# Patient Record
Sex: Male | Born: 1960 | Race: White | Hispanic: No | Marital: Married | Smoking: Never smoker
Health system: Southern US, Community
[De-identification: ages and names within clinical notes are randomized; demographics above are authoritative.]

## PROBLEM LIST (undated history)

## (undated) DIAGNOSIS — J189 Pneumonia, unspecified organism: Secondary | ICD-10-CM

## (undated) DIAGNOSIS — F419 Anxiety disorder, unspecified: Secondary | ICD-10-CM

## (undated) DIAGNOSIS — F32A Depression, unspecified: Secondary | ICD-10-CM

## (undated) DIAGNOSIS — I10 Essential (primary) hypertension: Secondary | ICD-10-CM

## (undated) HISTORY — PX: ANKLE FRACTURE SURGERY: SHX122

---

## 1999-05-29 ENCOUNTER — Ambulatory Visit (HOSPITAL_COMMUNITY): Admission: RE | Admit: 1999-05-29 | Discharge: 1999-05-29 | Payer: Self-pay | Admitting: Gastroenterology

## 2006-12-30 ENCOUNTER — Ambulatory Visit (HOSPITAL_COMMUNITY): Admission: AD | Admit: 2006-12-30 | Discharge: 2006-12-30 | Payer: Self-pay | Admitting: Orthopedic Surgery

## 2015-09-10 HISTORY — PX: COLON SURGERY: SHX602

## 2016-09-23 ENCOUNTER — Other Ambulatory Visit: Payer: Self-pay | Admitting: Family Medicine

## 2016-09-23 DIAGNOSIS — L723 Sebaceous cyst: Secondary | ICD-10-CM | POA: Diagnosis not present

## 2016-09-23 DIAGNOSIS — B079 Viral wart, unspecified: Secondary | ICD-10-CM | POA: Diagnosis not present

## 2016-12-31 DIAGNOSIS — I10 Essential (primary) hypertension: Secondary | ICD-10-CM | POA: Diagnosis not present

## 2017-04-22 DIAGNOSIS — H401131 Primary open-angle glaucoma, bilateral, mild stage: Secondary | ICD-10-CM | POA: Diagnosis not present

## 2017-04-23 ENCOUNTER — Encounter (HOSPITAL_BASED_OUTPATIENT_CLINIC_OR_DEPARTMENT_OTHER): Payer: Self-pay | Admitting: Emergency Medicine

## 2017-04-23 ENCOUNTER — Emergency Department (HOSPITAL_BASED_OUTPATIENT_CLINIC_OR_DEPARTMENT_OTHER): Payer: 59

## 2017-04-23 ENCOUNTER — Inpatient Hospital Stay: Admit: 2017-04-23 | Payer: Self-pay | Admitting: General Surgery

## 2017-04-23 ENCOUNTER — Inpatient Hospital Stay (HOSPITAL_COMMUNITY): Payer: 59

## 2017-04-23 ENCOUNTER — Inpatient Hospital Stay (HOSPITAL_BASED_OUTPATIENT_CLINIC_OR_DEPARTMENT_OTHER)
Admission: EM | Admit: 2017-04-23 | Discharge: 2017-04-28 | DRG: 331 | Disposition: A | Discharge status: Home / Self Care | Payer: 59 | Attending: General Surgery | Admitting: General Surgery

## 2017-04-23 DIAGNOSIS — K56699 Other intestinal obstruction unspecified as to partial versus complete obstruction: Secondary | ICD-10-CM | POA: Diagnosis not present

## 2017-04-23 DIAGNOSIS — K56609 Unspecified intestinal obstruction, unspecified as to partial versus complete obstruction: Principal | ICD-10-CM | POA: Diagnosis present

## 2017-04-23 DIAGNOSIS — I10 Essential (primary) hypertension: Secondary | ICD-10-CM | POA: Diagnosis present

## 2017-04-23 DIAGNOSIS — K50012 Crohn's disease of small intestine with intestinal obstruction: Secondary | ICD-10-CM | POA: Diagnosis not present

## 2017-04-23 DIAGNOSIS — R1033 Periumbilical pain: Secondary | ICD-10-CM | POA: Diagnosis not present

## 2017-04-23 DIAGNOSIS — Q430 Meckel's diverticulum (displaced) (hypertrophic): Secondary | ICD-10-CM | POA: Diagnosis not present

## 2017-04-23 DIAGNOSIS — K56600 Partial intestinal obstruction, unspecified as to cause: Secondary | ICD-10-CM | POA: Diagnosis not present

## 2017-04-23 DIAGNOSIS — Z4682 Encounter for fitting and adjustment of non-vascular catheter: Secondary | ICD-10-CM | POA: Diagnosis not present

## 2017-04-23 DIAGNOSIS — R109 Unspecified abdominal pain: Secondary | ICD-10-CM | POA: Diagnosis not present

## 2017-04-23 HISTORY — DX: Essential (primary) hypertension: I10

## 2017-04-23 LAB — URINALYSIS, ROUTINE W REFLEX MICROSCOPIC
Bilirubin Urine: NEGATIVE
Glucose, UA: NEGATIVE mg/dL
Hgb urine dipstick: NEGATIVE
Ketones, ur: 15 mg/dL — AB
LEUKOCYTES UA: NEGATIVE
NITRITE: NEGATIVE
PROTEIN: NEGATIVE mg/dL
SPECIFIC GRAVITY, URINE: 1.019 (ref 1.005–1.030)
pH: 8 (ref 5.0–8.0)

## 2017-04-23 LAB — COMPREHENSIVE METABOLIC PANEL
ALBUMIN: 4.8 g/dL (ref 3.5–5.0)
ALT: 38 U/L (ref 17–63)
ANION GAP: 10 (ref 5–15)
AST: 35 U/L (ref 15–41)
Alkaline Phosphatase: 52 U/L (ref 38–126)
BUN: 13 mg/dL (ref 6–20)
CHLORIDE: 103 mmol/L (ref 101–111)
CO2: 22 mmol/L (ref 22–32)
Calcium: 9.3 mg/dL (ref 8.9–10.3)
Creatinine, Ser: 0.82 mg/dL (ref 0.61–1.24)
GFR calc Af Amer: >60 mL/min (ref >60)
GLUCOSE: 121 mg/dL — AB (ref 65–99)
POTASSIUM: 4 mmol/L (ref 3.5–5.1)
Sodium: 135 mmol/L (ref 135–145)
TOTAL PROTEIN: 7.1 g/dL (ref 6.5–8.1)
Total Bilirubin: 1.2 mg/dL (ref 0.3–1.2)

## 2017-04-23 LAB — LIPASE, BLOOD: LIPASE: 24 U/L (ref 11–51)

## 2017-04-23 LAB — CBC
HEMATOCRIT: 43.8 % (ref 39.0–52.0)
HEMOGLOBIN: 15.8 g/dL (ref 13.0–17.0)
MCH: 34.3 pg — ABNORMAL HIGH (ref 26.0–34.0)
MCHC: 36.1 g/dL — ABNORMAL HIGH (ref 30.0–36.0)
MCV: 95.2 fL (ref 78.0–100.0)
Platelets: 229 10*3/uL (ref 150–400)
RBC: 4.6 MIL/uL (ref 4.22–5.81)
RDW: 11.6 % (ref 11.5–15.5)
WBC: 8.8 10*3/uL (ref 4.0–10.5)

## 2017-04-23 IMAGING — DX DG ABD PORTABLE 1V
1 series · 1 of 1 positions shown · non-contrast
Comparison: None.

CLINICAL DATA: Nasogastric tube placement.

EXAM:
PORTABLE ABDOMEN - 1 VIEW

[abdomen kub]
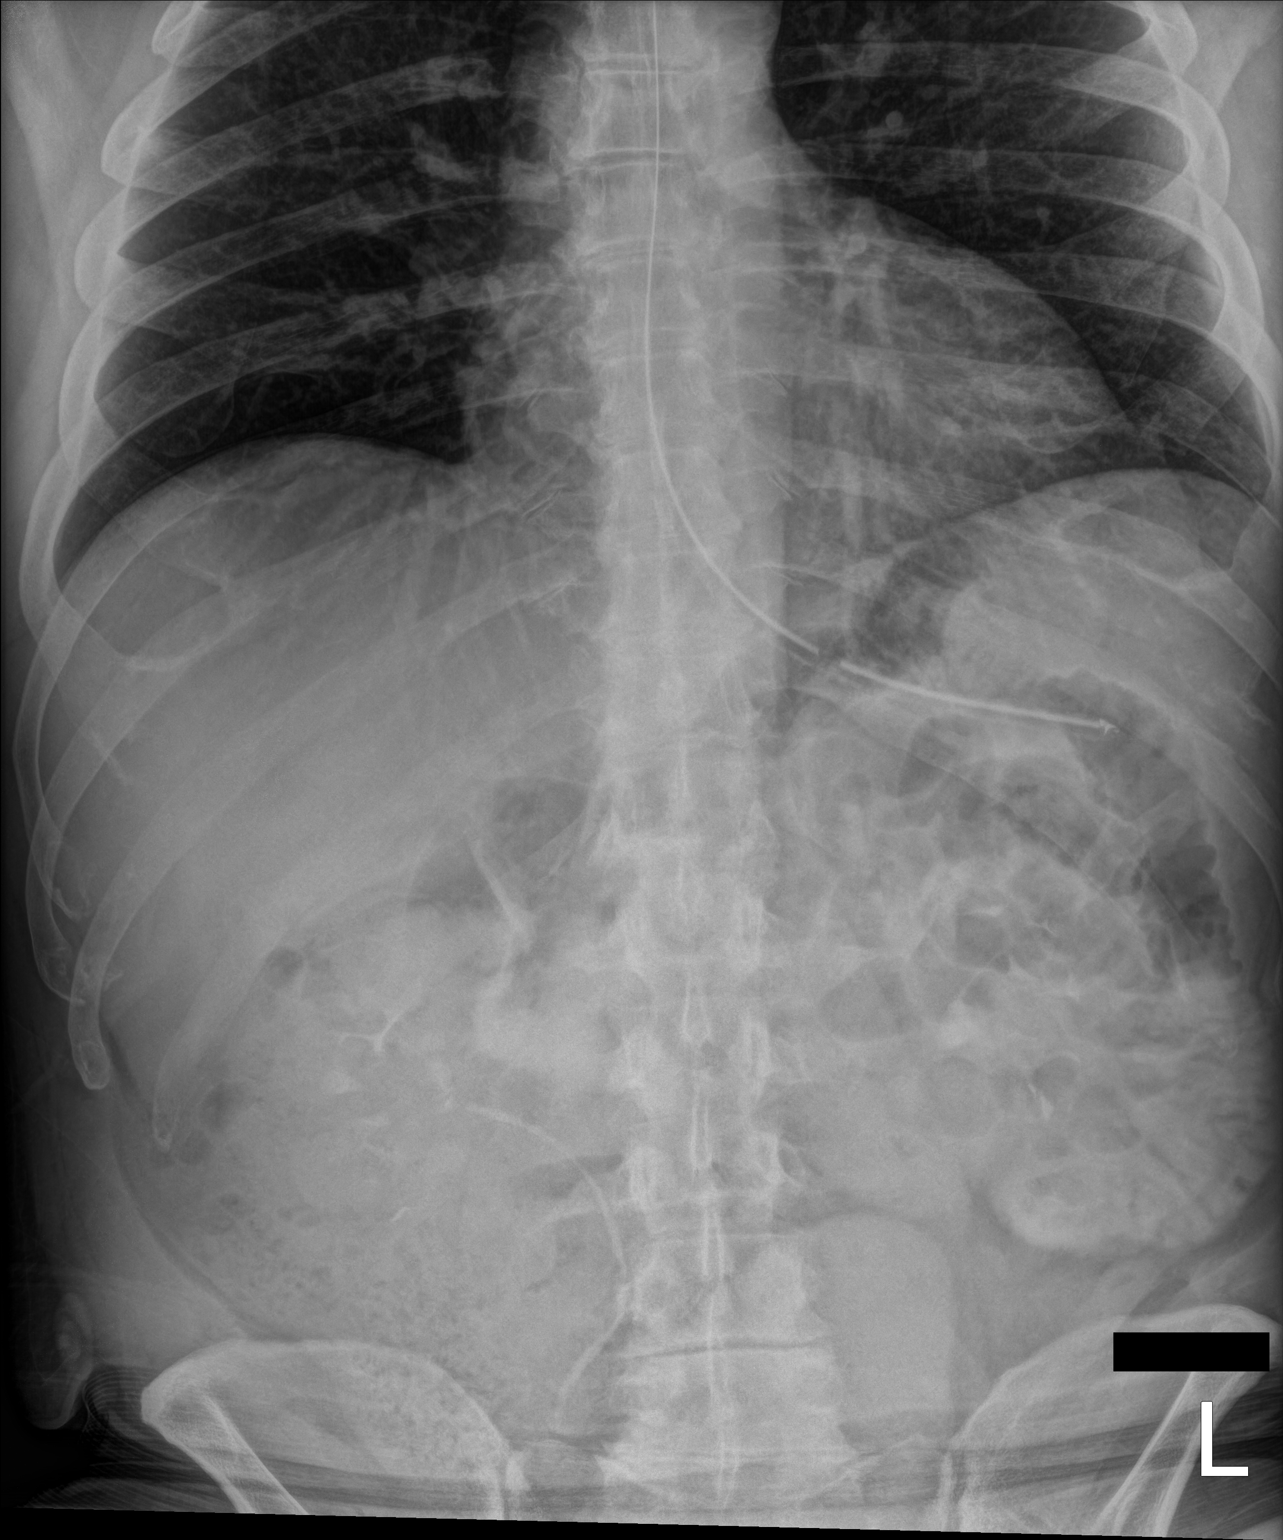

[1 of 1 positions shown; findings below may reference images not displayed]

FINDINGS: The bowel gas pattern is normal. Distal tip of nasogastric tube is
seen in expected position of proximal stomach. No radio-opaque
calculi or other significant radiographic abnormality are seen.
IMPRESSION: No evidence of bowel obstruction or ileus. Distal tip of nasogastric
tube is seen in expected position of proximal stomach.

## 2017-04-23 IMAGING — CT CT ABD-PELV W/ CM
2 of 5 series · 16 of 46 positions shown, 18 images · IV contrast (APPLIED)
Comparison: None.

CLINICAL DATA: Awoke early this morning with abdominal pain.

EXAM:
CT ABDOMEN AND PELVIS WITH CONTRAST
TECHNIQUE: Multidetector CT imaging of the abdomen and pelvis was performed
using the standard protocol following bolus administration of
intravenous contrast.
CONTRAST:  100mL [05] IOPAMIDOL ([05]) INJECTION 61%

[Series 2: axial st · axial · 0.95mm/px · z∈[-528,-54]mm · 13 of 107 slices shown, 15 images]
[im 6/107  soft-tissue]
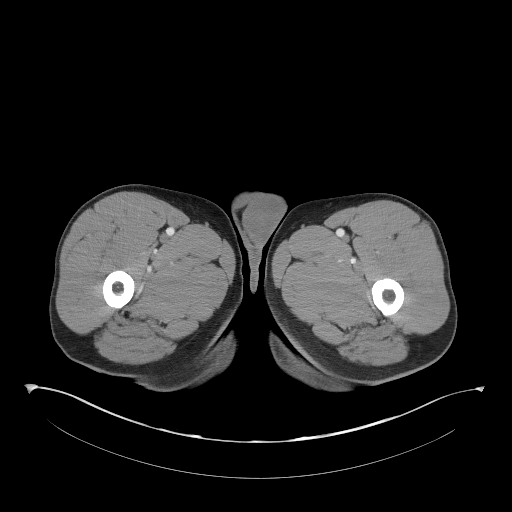
[im 6/107  bone]
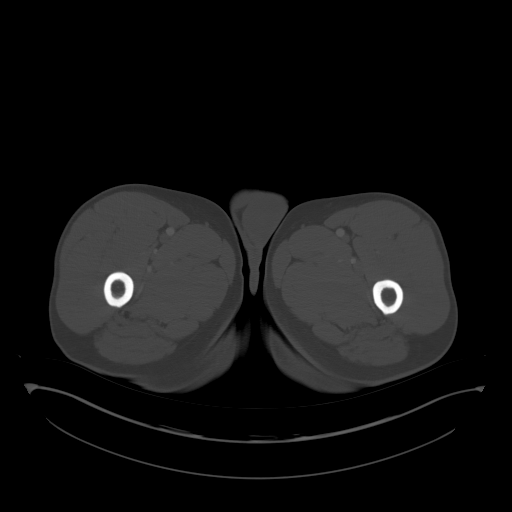
[im 17/107  soft-tissue]
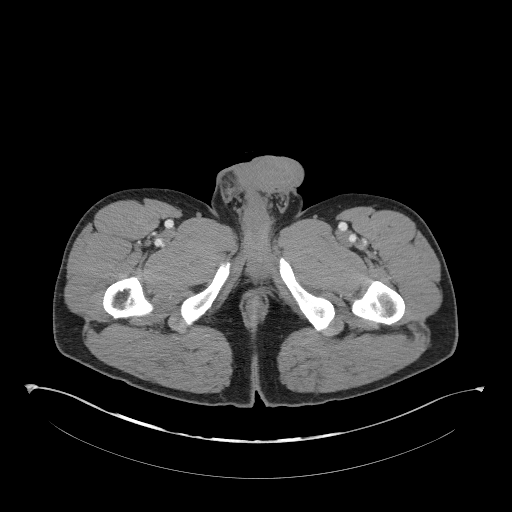
[im 23/107  soft-tissue]
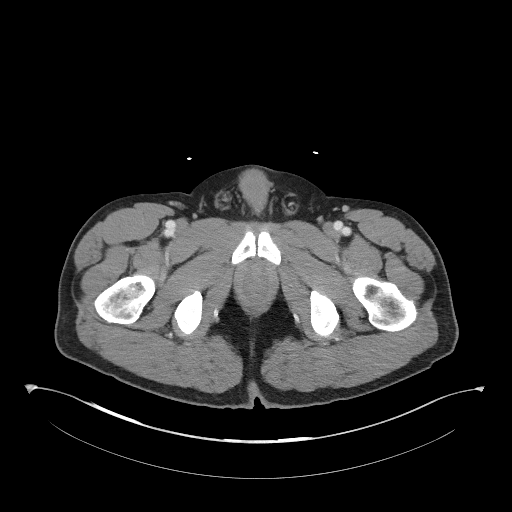
[im 28/107  soft-tissue]
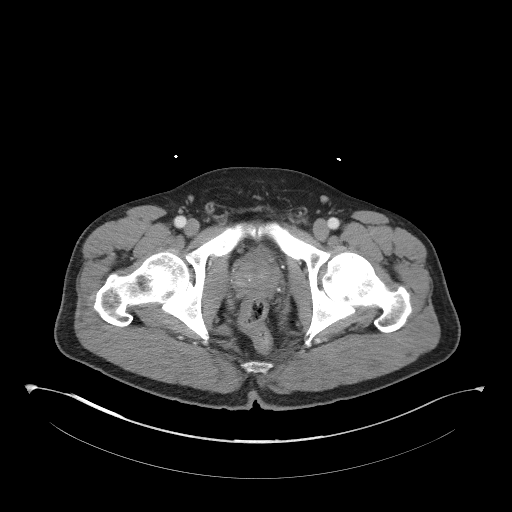
[im 40/107  soft-tissue]
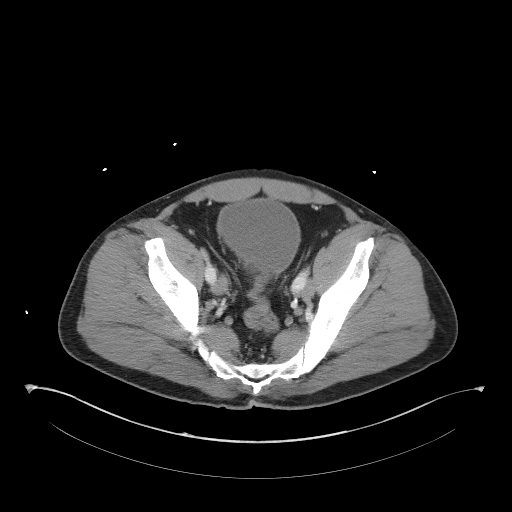
[im 45/107  soft-tissue]
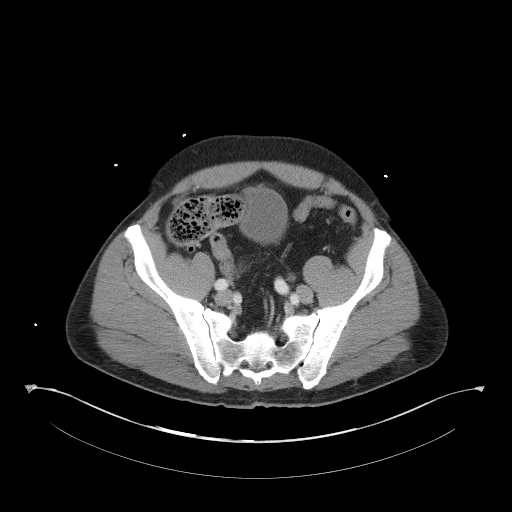
[im 56/107  soft-tissue]
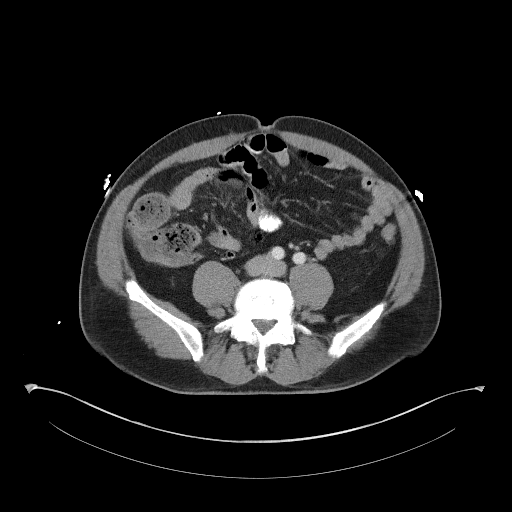
[im 62/107  soft-tissue]
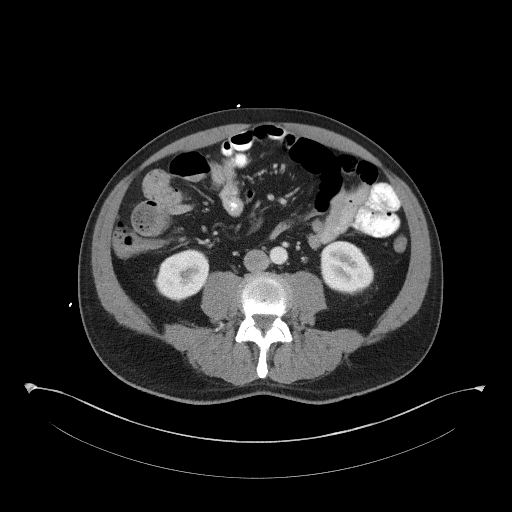
[im 67/107  soft-tissue]
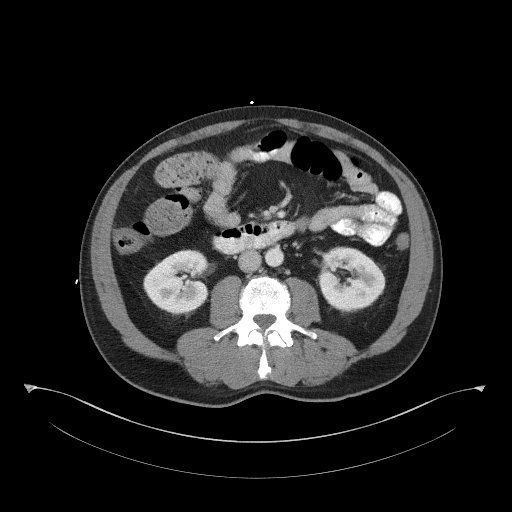
[im 67/107  bone]
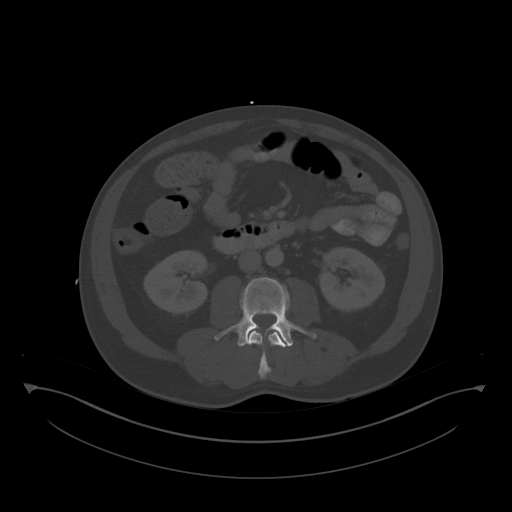
[im 79/107  soft-tissue]
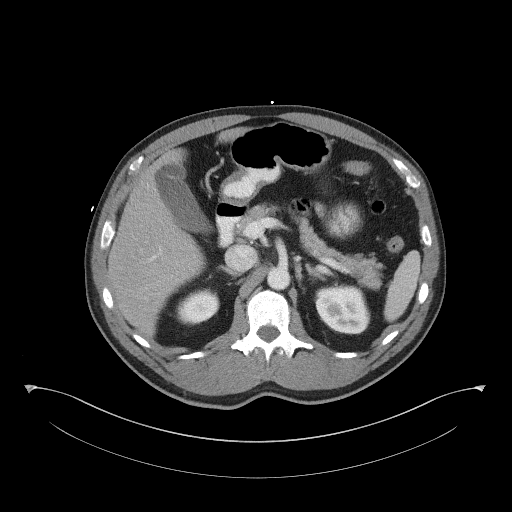
[im 84/107  soft-tissue]
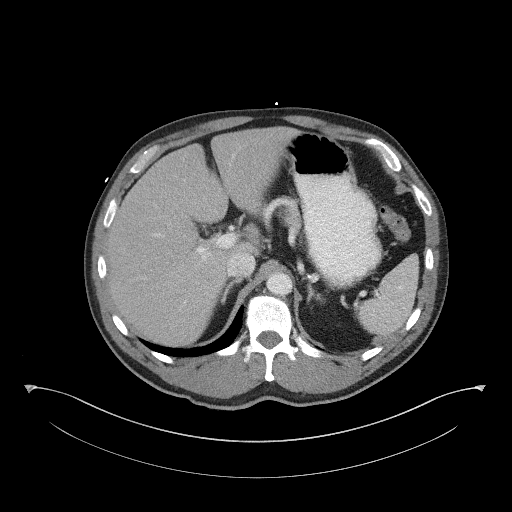
[im 90/107  soft-tissue]
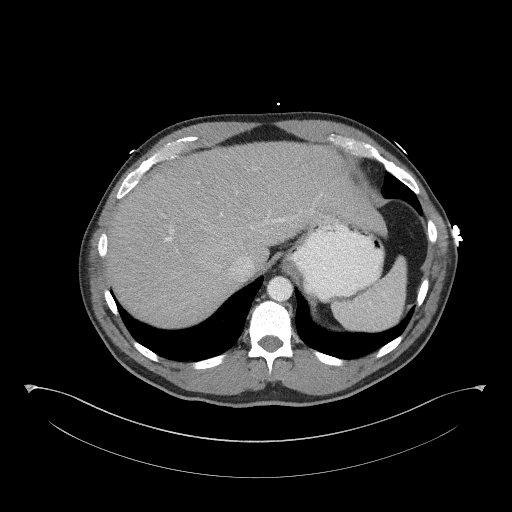
[im 101/107  soft-tissue]
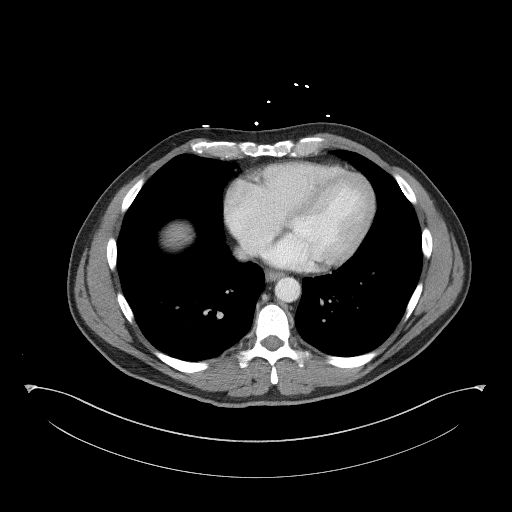

[Series 5: coronal st · coronal · 0.83mm/px · 3 of 102 slices shown]
[im 34/102  soft-tissue]
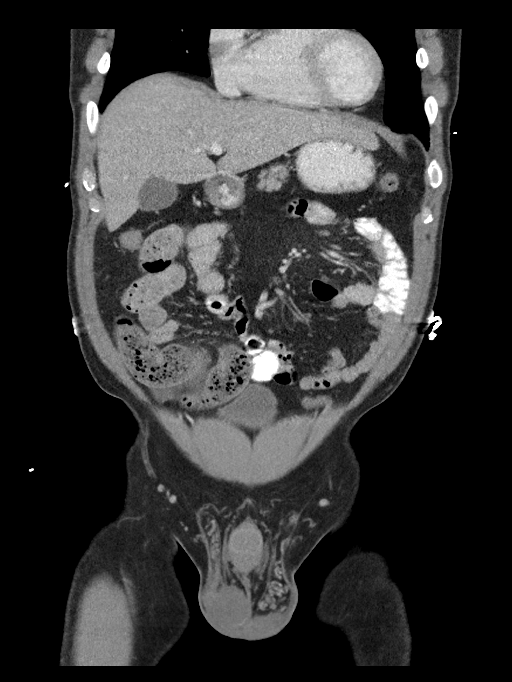
[im 45/102  soft-tissue]
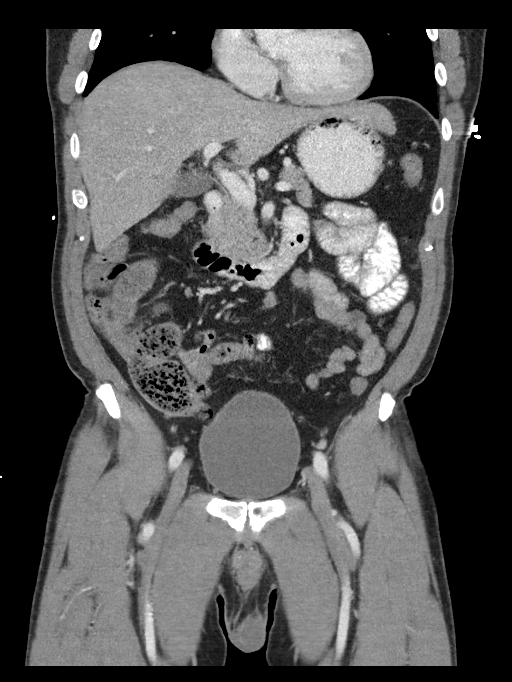
[im 57/102  soft-tissue]
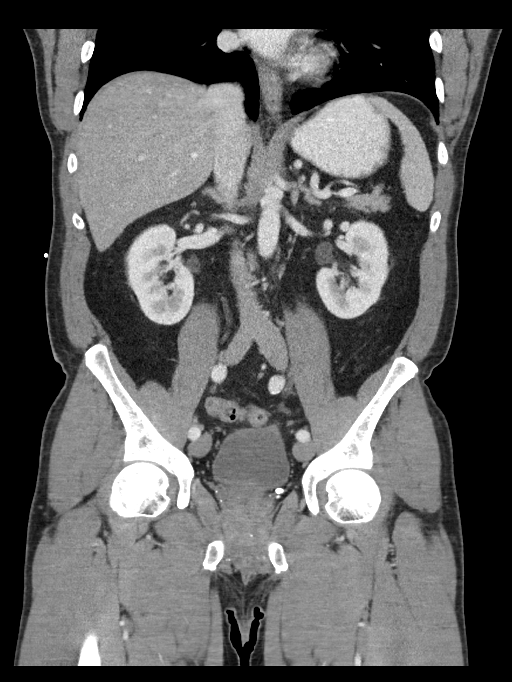

[16 of 46 positions shown; findings below may reference images not displayed]

FINDINGS: Lower chest:  No contributory findings.

Hepatobiliary: No focal liver abnormality.No evidence of biliary
obstruction or stone.

Pancreas: Unremarkable.

Spleen: Unremarkable.

Adrenals/Urinary Tract: Negative adrenals. No hydronephrosis or
stone. Unremarkable bladder.

Stomach/Bowel: Ileum is dilated with fecalized contents. Abrupt
transition of the terminal ileum best seen on coronal reformats,
with no evidence of underlying ileitis or mass. There is associated
mesenteric edema and trace peritoneal fluid. The appendix is seen
and negative. Few colonic diverticula at the level of the sigmoid.

Vascular/Lymphatic: No acute vascular abnormality. No mass or
adenopathy.

Reproductive:Mild symmetric prostate enlargement.

Other: No ascites or pneumoperitoneum.

Musculoskeletal: Generalized disc narrowing with endplate
degeneration greatest at L4-5 and L5-S1.
IMPRESSION: Findings of early small bowel obstruction with dilated ileum,
fecalized contents and mild mesenteric edema. A transition point is
seen at the terminal ileum.

## 2017-04-23 MED ORDER — HYDRALAZINE HCL 20 MG/ML IJ SOLN
10.0000 mg | INTRAMUSCULAR | Status: DC | PRN
  Filled 2017-04-23: qty 0.5

## 2017-04-23 MED ORDER — SODIUM CHLORIDE 0.9 % IV SOLN
INTRAVENOUS | Status: DC
  Administered 2017-04-23 – 2017-04-27 (×8): via INTRAVENOUS

## 2017-04-23 MED ORDER — LIDOCAINE HCL 2 % EX GEL
CUTANEOUS | Status: AC
  Administered 2017-04-23: 1
  Filled 2017-04-23: qty 20

## 2017-04-23 MED ORDER — LORAZEPAM 2 MG/ML IJ SOLN
1.0000 mg | Freq: Once | INTRAMUSCULAR | Status: AC
  Administered 2017-04-23: 1 mg via INTRAVENOUS
  Filled 2017-04-23: qty 1

## 2017-04-23 MED ORDER — ONDANSETRON HCL 4 MG/2ML IJ SOLN: 4.0000 mg | Freq: Three times a day (TID) | INTRAMUSCULAR | Status: AC | PRN

## 2017-04-23 MED ORDER — ENOXAPARIN SODIUM 40 MG/0.4ML ~~LOC~~ SOLN
40.0000 mg | SUBCUTANEOUS | Status: DC
  Administered 2017-04-23 – 2017-04-24 (×2): 40 mg via SUBCUTANEOUS
  Filled 2017-04-23 (×2): qty 0.4

## 2017-04-23 MED ORDER — HYDROMORPHONE HCL 1 MG/ML IJ SOLN: 1.0000 mg | INTRAMUSCULAR | Status: DC | PRN

## 2017-04-23 MED ORDER — SODIUM CHLORIDE 0.9 % IV SOLN
INTRAVENOUS | Status: AC
  Administered 2017-04-23: 12:00:00 via INTRAVENOUS

## 2017-04-23 MED ORDER — DIPHENHYDRAMINE HCL 50 MG/ML IJ SOLN: 25.0000 mg | Freq: Four times a day (QID) | INTRAMUSCULAR | Status: DC | PRN

## 2017-04-23 MED ORDER — MORPHINE SULFATE (PF) 2 MG/ML IV SOLN
2.0000 mg | INTRAVENOUS | Status: DC | PRN
  Administered 2017-04-23 – 2017-04-25 (×12): 4 mg via INTRAVENOUS
  Administered 2017-04-25: 2 mg via INTRAVENOUS
  Filled 2017-04-23 (×2): qty 2
  Filled 2017-04-23: qty 1
  Filled 2017-04-23 (×5): qty 2
  Filled 2017-04-23: qty 1
  Filled 2017-04-23 (×4): qty 2
  Filled 2017-04-23: qty 1

## 2017-04-23 MED ORDER — MORPHINE SULFATE (PF) 4 MG/ML IV SOLN
4.0000 mg | INTRAVENOUS | Status: DC | PRN
  Administered 2017-04-23: 4 mg via INTRAVENOUS
  Filled 2017-04-23: qty 1

## 2017-04-23 MED ORDER — MENTHOL 3 MG MT LOZG
1.0000 | LOZENGE | OROMUCOSAL | Status: DC | PRN
  Filled 2017-04-23 (×2): qty 9

## 2017-04-23 MED ORDER — HYDROMORPHONE HCL 1 MG/ML IJ SOLN
1.0000 mg | Freq: Once | INTRAMUSCULAR | Status: AC
  Administered 2017-04-23: 1 mg via INTRAVENOUS
  Filled 2017-04-23: qty 1

## 2017-04-23 MED ORDER — DIATRIZOATE MEGLUMINE & SODIUM 66-10 % PO SOLN
90.0000 mL | Freq: Once | ORAL | Status: AC
  Administered 2017-04-23: 90 mL via NASOGASTRIC
  Filled 2017-04-23: qty 90

## 2017-04-23 MED ORDER — IOPAMIDOL (ISOVUE-300) INJECTION 61%
100.0000 mL | Freq: Once | INTRAVENOUS | Status: AC | PRN
  Administered 2017-04-23: 100 mL via INTRAVENOUS

## 2017-04-23 MED ORDER — HYDROMORPHONE HCL-NACL 0.5-0.9 MG/ML-% IV SOSY: 1.0000 mg | PREFILLED_SYRINGE | Freq: Once | INTRAVENOUS | Status: DC

## 2017-04-23 NOTE — ED Notes (Signed)
ED Provider at bedside. 

## 2017-04-23 NOTE — H&P (Signed)
[facility] Consult/Admission Note  Christopher Carter [birth year]  [id number].    Requesting MD: Dr. Venora Maples Chief Complaint/Reason for Consult: SBO  HPI:   Pt is a 56 year old male with a history of HTN who presented to [facility] ED with complaints of abdominal pain. Pt states pain started abruptly at 0100 today and waxes and wanes in severity. Pain is located in the periumbilical region, non-radiating, crampy at times and sharp at times. He had a normal Bm this AM. Associated one episode of non-bilious vomiting. No other associated symptoms. Pt was feeling fine with normal appetite, eating, having BM's exercising up until this morning. No fever, chills, CP, SOB, dizziness, blood in stool or vomit. No history of abdominal surgeries or intestinal issues. Pt believes he had a colonoscopy years ago for blood in his stools and no acute issues were found. Pt does not take blood thinners. Last meal was last night.   ED Course: Labs: glucose 121, all others unremarkable CT scan abd/pel: Findings of early small bowel obstruction with dilated ileum, fecalized contents and mild mesenteric edema. A transition point is seen at the terminal ileum  ROS:  Review of Systems  Constitutional: Negative for chills, diaphoresis and fever.  Respiratory: Negative for shortness of breath.   Cardiovascular: Negative for chest pain.  Gastrointestinal: Positive for abdominal pain, nausea and vomiting. Negative for blood in stool, constipation, diarrhea and melena.  Skin: Negative for rash.  Neurological: Negative for dizziness and loss of consciousness.  All other systems reviewed and are negative.    No family history on file.  Past Medical History:  Diagnosis Date  . Hypertension     History reviewed. No pertinent surgical history.  Social History:  reports that he has never smoked. He has never used smokeless tobacco. He reports that he drinks about 3.0 oz of alcohol per week  . He reports that he does not use drugs.  Allergies: No Known Allergies  Medications Prior to Admission  Medication Sig Dispense Refill  . amLODipine (NORVASC) 10 MG tablet Take 10 mg by mouth daily.    . Glucos-Chond-Hyal Ac-Ca Fructo (MOVE FREE JOINT HEALTH ADVANCE PO) Take 1 tablet by mouth daily.    Christopher Carter HAWTHORNE BERRY PO Take 1 tablet by mouth daily.    . irbesartan (AVAPRO) 300 MG tablet Take 300 mg by mouth daily.    Christopher Carter KRILL OIL PO Take 1 capsule by mouth daily.    Christopher Carter OVER THE COUNTER MEDICATION Take 1 tablet by mouth daily.    . Red Yeast Rice Extract (RED YEAST RICE PO) Take 2 tablets by mouth daily.      Blood pressure (!) 156/90, pulse 93, temperature 98.4 F (36.9 C), temperature source Oral, resp. rate (!) 22, height _0  (1.778 m), weight 200 lb (90.7 kg), SpO2 98 %.  Physical Exam  Constitutional: He is oriented to person, place, and time and well-developed, well-nourished, and in no distress. No distress.  HENT:  Head: Normocephalic and atraumatic.  Nose: Nose normal.  Mouth/Throat: Oropharynx is clear and moist. No oropharyngeal exudate.  Eyes: Pupils are equal, round, and reactive to light. Conjunctivae are normal. Right eye exhibits no discharge. Left eye exhibits no discharge. No scleral icterus.  Neck: Normal range of motion. Neck supple. No thyromegaly present.  Cardiovascular: Normal rate, regular rhythm, normal heart sounds and intact distal pulses.  Exam reveals no gallop and no friction rub.   No murmur heard. Pulses:  Radial pulses are 2+ on the right side, and 2+ on the left side.  Pulmonary/Chest: Effort normal and breath sounds normal. No respiratory distress. He has no wheezes. He has no rhonchi. He has no rales.  Abdominal: Soft. Normal appearance and bowel sounds are normal. He exhibits no distension. There is no hepatosplenomegaly. There is tenderness in the right lower quadrant. No hernia.  Musculoskeletal: Normal range of motion. He exhibits no  edema, tenderness or deformity.  Lymphadenopathy:    He has no cervical adenopathy.  Neurological: He is alert and oriented to person, place, and time. No cranial nerve deficit (grossly intact).  Skin: Skin is warm and dry. No rash noted. He is not diaphoretic.  Psychiatric: Mood normal. He has a flat affect.  Nursing note and vitals reviewed.   Results for orders placed or performed during the hospital encounter of 04/23/17 (from the past 48 hour(s))  Lipase, blood     Status: None   Collection Time: 04/23/17  9:21 AM  Result Value Ref Range   Lipase 24 11 - 51 U/L  Comprehensive metabolic panel     Status: Abnormal   Collection Time: 04/23/17  9:21 AM  Result Value Ref Range   Sodium 135 135 - 145 mmol/L   Potassium 4.0 3.5 - 5.1 mmol/L   Chloride 103 101 - 111 mmol/L   CO2 22 22 - 32 mmol/L   Glucose, Bld 121 (H) 65 - 99 mg/dL   BUN 13 6 - 20 mg/dL   Creatinine, Ser 0.82 0.61 - 1.24 mg/dL   Calcium 9.3 8.9 - 10.3 mg/dL   Total Protein 7.1 6.5 - 8.1 g/dL   Albumin 4.8 3.5 - 5.0 g/dL   AST 35 15 - 41 U/L   ALT 38 17 - 63 U/L   Alkaline Phosphatase 52 38 - 126 U/L   Total Bilirubin 1.2 0.3 - 1.2 mg/dL   GFR calc non Af Amer >60 >60 mL/min   GFR calc Af Amer >60 >60 mL/min    Comment: (NOTE) The eGFR has been calculated using the CKD EPI equation. This calculation has not been validated in all clinical situations. eGFR's persistently <60 mL/min signify possible Chronic Kidney Disease.    Anion gap 10 5 - 15  CBC     Status: Abnormal   Collection Time: 04/23/17  9:21 AM  Result Value Ref Range   WBC 8.8 4.0 - 10.5 K/uL   RBC 4.60 4.22 - 5.81 MIL/uL   Hemoglobin 15.8 13.0 - 17.0 g/dL   HCT 43.8 39.0 - 52.0 %   MCV 95.2 78.0 - 100.0 fL   MCH 34.3 (H) 26.0 - 34.0 pg   MCHC 36.1 (H) 30.0 - 36.0 g/dL   RDW 11.6 11.5 - 15.5 %   Platelets 229 150 - 400 K/uL  Urinalysis, Routine w reflex microscopic     Status: Abnormal   Collection Time: 04/23/17 10:45 AM  Result Value  Ref Range   Color, Urine YELLOW YELLOW   APPearance CLEAR CLEAR   Specific Gravity, Urine 1.019 1.005 - 1.030   pH 8.0 5.0 - 8.0   Glucose, UA NEGATIVE NEGATIVE mg/dL   Hgb urine dipstick NEGATIVE NEGATIVE   Bilirubin Urine NEGATIVE NEGATIVE   Ketones, ur 15 (A) NEGATIVE mg/dL   Protein, ur NEGATIVE NEGATIVE mg/dL   Nitrite NEGATIVE NEGATIVE   Leukocytes, UA NEGATIVE NEGATIVE    Comment: Microscopic not done on urines with negative protein, blood, leukocytes, nitrite, or glucose < 500  mg/dL.   Ct Abdomen Pelvis W Contrast  Result Date: 04/23/2017 CLINICAL DATA:  Awoke early this morning with abdominal pain. EXAM: CT ABDOMEN AND PELVIS WITH CONTRAST TECHNIQUE: Multidetector CT imaging of the abdomen and pelvis was performed using the standard protocol following bolus administration of intravenous contrast. CONTRAST:  1100m ISOVUE-300 IOPAMIDOL (ISOVUE-300) INJECTION 61% COMPARISON:  None. FINDINGS: Lower chest:  No contributory findings. Hepatobiliary: No focal liver abnormality.No evidence of biliary obstruction or stone. Pancreas: Unremarkable. Spleen: Unremarkable. Adrenals/Urinary Tract: Negative adrenals. No hydronephrosis or stone. Unremarkable bladder. Stomach/Bowel: Ileum is dilated with fecalized contents. Abrupt transition of the terminal ileum best seen on coronal reformats, with no evidence of underlying ileitis or mass. There is associated mesenteric edema and trace peritoneal fluid. The appendix is seen and negative. Few colonic diverticula at the level of the sigmoid. Vascular/Lymphatic: No acute vascular abnormality. No mass or adenopathy. Reproductive:Mild symmetric prostate enlargement. Other: No ascites or pneumoperitoneum. Musculoskeletal: Generalized disc narrowing with endplate degeneration greatest at L4-5 and L5-S1. IMPRESSION: Findings of early small bowel obstruction with dilated ileum, fecalized contents and mild mesenteric edema. A transition point is seen at the terminal  ileum. Electronically Signed   By: JMonte FantasiaM.D.   On: 04/23/2017 10:45   Dg Abd Portable 1 View  Result Date: 04/23/2017 CLINICAL DATA:  Nasogastric tube placement. EXAM: PORTABLE ABDOMEN - 1 VIEW COMPARISON:  None. FINDINGS: The bowel gas pattern is normal. Distal tip of nasogastric tube is seen in expected position of proximal stomach. No radio-opaque calculi or other significant radiographic abnormality are seen. IMPRESSION: No evidence of bowel obstruction or ileus. Distal tip of nasogastric tube is seen in expected position of proximal stomach. Electronically Signed   By: JMarijo Conception M.D.   On: 04/23/2017 11:37      Assessment/Plan  HTN - IV hydralazine PRN  SBO - CT scan shows dilated ileum, fecalized contents and mild mesenteric edema with transition point at the terminal ileum - no hx of abdominal surgeries - SBO protocol  FEN: NPO, IVF, NGT VTE: SCD's, lovenox ID: none Foley: none  Plan: SBO protocol, NPO, NGT, IVF Hopefully this will resolve without surgery and then a follow up colonoscopy   JKalman Drape PHam[facility] HospitalSur[facility] 04/23/2017, 3:09 PM Pager: [phone]: [phone]-Fri 7:00 am-4:30 pm Sat-Sun 7:00 am-11:30 am

## 2017-04-23 NOTE — ED Provider Notes (Addendum)
[facility] DEPT [facility] Provider Note   CSN: [id number] Arrival date & time: 04/23/17  0909     History   Chief Complaint Chief Complaint  Patient presents with  . Abdominal Pain    HPI Christopher Carter is a 56 y.o. male.  HPI Patient is a 56 year old male presents the emergency department with acute onset abdominal pain which began around 1 AM.  He had nausea and one episode of nonbloody nonbilious vomiting.  No diarrhea.  Presents with ongoing severe mid abdominal pain at this time.  No prior history of abdominal surgery.  He's never had pain or discomfort like this before.  No chest pain shortness breath.  Denies back pain.  No urinary complaints.  No history kidney stones.  Denies a history of inflammatory bowel disease.   Past Medical History:  Diagnosis Date  . Hypertension     There are no active problems to display for this patient.   History reviewed. No pertinent surgical history.     Home Medications    Prior to Admission medications   Medication Sig Start Date End Date Taking? Authorizing Provider  amLODipine (NORVASC) 10 MG tablet Take 10 mg by mouth daily.   Yes [provider]  irbesartan (AVAPRO) 300 MG tablet Take 300 mg by mouth daily.   Yes [provider]    Family History No family history on file.  Social History Social History  Substance Use Topics  . Smoking status: Never Smoker  . Smokeless tobacco: Never Used  . Alcohol use 3.0 oz/week    5 Cans of beer per week     Comment: daily     Allergies   Patient has no known allergies.   Review of Systems Review of Systems  All other systems reviewed and are negative.    Physical Exam Updated Vital Signs BP (!) 150/82 (BP Location: Left Arm)   Pulse 66   Temp 98.4 F (36.9 C) (Oral)   Resp 18   Ht 5\' 10"  (1.778 m)   Wt 90.7 kg (200 lb)   SpO2 99%   BMI 28.70 kg/m   Physical Exam  Constitutional: He is oriented to person, place, and time. He  appears well-developed and well-nourished.  HENT:  Head: Normocephalic and atraumatic.  Eyes: EOM are normal.  Neck: Normal range of motion.  Cardiovascular: Normal rate, regular rhythm and normal heart sounds.   Pulmonary/Chest: Effort normal and breath sounds normal. No respiratory distress.  Abdominal: Soft. He exhibits no distension.  Mild generalized abdominal tenderness without guarding or rebound.  No peritoneal signs  Musculoskeletal: Normal range of motion.  Neurological: He is alert and oriented to person, place, and time.  Skin: Skin is warm and dry.  Psychiatric: He has a normal mood and affect. Judgment normal.  Nursing note and vitals reviewed.    ED Treatments / Results  Labs (all labs ordered are listed, but only abnormal results are displayed) Labs Reviewed  COMPREHENSIVE METABOLIC PANEL - Abnormal; Notable for the following:       Result Value   Glucose, Bld 121 (*)    All other components within normal limits  CBC - Abnormal; Notable for the following:    MCH 34.3 (*)    MCHC 36.1 (*)    All other components within normal limits  LIPASE, BLOOD  URINALYSIS, ROUTINE W REFLEX MICROSCOPIC    EKG  EKG Interpretation  Date/Time:  Wednesday April 23 2017 09:59:47 EDT Ventricular Rate:  56 PR  Interval:    QRS Duration: 92 QT Interval:  418 QTC Calculation: 404 R Axis:   20 Text Interpretation:  Sinus rhythm Prolonged PR interval Anterior infarct, old Baseline wander in lead(s) V3 No significant change was found Confirmed by Jola Schmidt [phone]) on 04/23/2017 10:24:00 AM       Radiology Ct Abdomen Pelvis W Contrast  Result Date: 04/23/2017 CLINICAL DATA:  Awoke early this morning with abdominal pain. EXAM: CT ABDOMEN AND PELVIS WITH CONTRAST TECHNIQUE: Multidetector CT imaging of the abdomen and pelvis was performed using the standard protocol following bolus administration of intravenous contrast. CONTRAST:  170mL ISOVUE-300 IOPAMIDOL (ISOVUE-300)  INJECTION 61% COMPARISON:  None. FINDINGS: Lower chest:  No contributory findings. Hepatobiliary: No focal liver abnormality.No evidence of biliary obstruction or stone. Pancreas: Unremarkable. Spleen: Unremarkable. Adrenals/Urinary Tract: Negative adrenals. No hydronephrosis or stone. Unremarkable bladder. Stomach/Bowel: Ileum is dilated with fecalized contents. Abrupt transition of the terminal ileum best seen on coronal reformats, with no evidence of underlying ileitis or mass. There is associated mesenteric edema and trace peritoneal fluid. The appendix is seen and negative. Few colonic diverticula at the level of the sigmoid. Vascular/Lymphatic: No acute vascular abnormality. No mass or adenopathy. Reproductive:Mild symmetric prostate enlargement. Other: No ascites or pneumoperitoneum. Musculoskeletal: Generalized disc narrowing with endplate degeneration greatest at L4-5 and L5-S1. IMPRESSION: Findings of early small bowel obstruction with dilated ileum, fecalized contents and mild mesenteric edema. A transition point is seen at the terminal ileum. Electronically Signed   By: Monte Fantasia M.D.   On: 04/23/2017 10:45    Procedures Procedures (including critical care time)  Medications Ordered in ED Medications  morphine 4 MG/ML injection 4 mg (not administered)  HYDROmorphone (DILAUDID) injection 1 mg (1 mg Intravenous Given 04/23/17 0944)  iopamidol (ISOVUE-300) 61 % injection 100 mL (100 mLs Intravenous Contrast Given 04/23/17 1023)     Initial Impression / Assessment and Plan / ED Course  I have reviewed the triage vital signs and the nursing notes.  Pertinent labs & imaging results that were available during my care of the patient were reviewed by me and considered in my medical decision making (see chart for details).     CT scan concerning for early small bowel obstruction.  No prior history of abdominal surgery.  NG tube will be placed now.  Case discussed with general surgery for  likely admission for conservative management of early small bowel obstruction.  Patient family updated.  We'll continue to observe the patient while in the emergency department  Spoke with Dr Excell Seltzer who accepts the patient in transfer  NG tube by nursing staff  Final Clinical Impressions(s) / ED Diagnoses   Final diagnoses:  Small bowel obstruction [facility])    New Prescriptions New Prescriptions   No medications on file     Jola Schmidt, MD 04/23/17 [place], Brittie Whisnant, MD 04/23/17 1132

## 2017-04-23 NOTE — ED Triage Notes (Signed)
Woke up with severe abd pain at 1 this morning. Vomited once due to pain. Sent from [place] for further eval. Denies urinary symptoms.

## 2017-04-24 ENCOUNTER — Inpatient Hospital Stay (HOSPITAL_COMMUNITY): Payer: 59

## 2017-04-24 IMAGING — DX DG ABD PORTABLE 1V
1 series · 1 of 1 positions shown · non-contrast
Comparison: CT of the abdomen and pelvis performed earlier today at
[DATE] a.m.

CLINICAL DATA: Small-bowel obstruction protocol. 8 hour delayed
film. Initial encounter.

EXAM:
PORTABLE ABDOMEN - 1 VIEW

[abdomen kub]
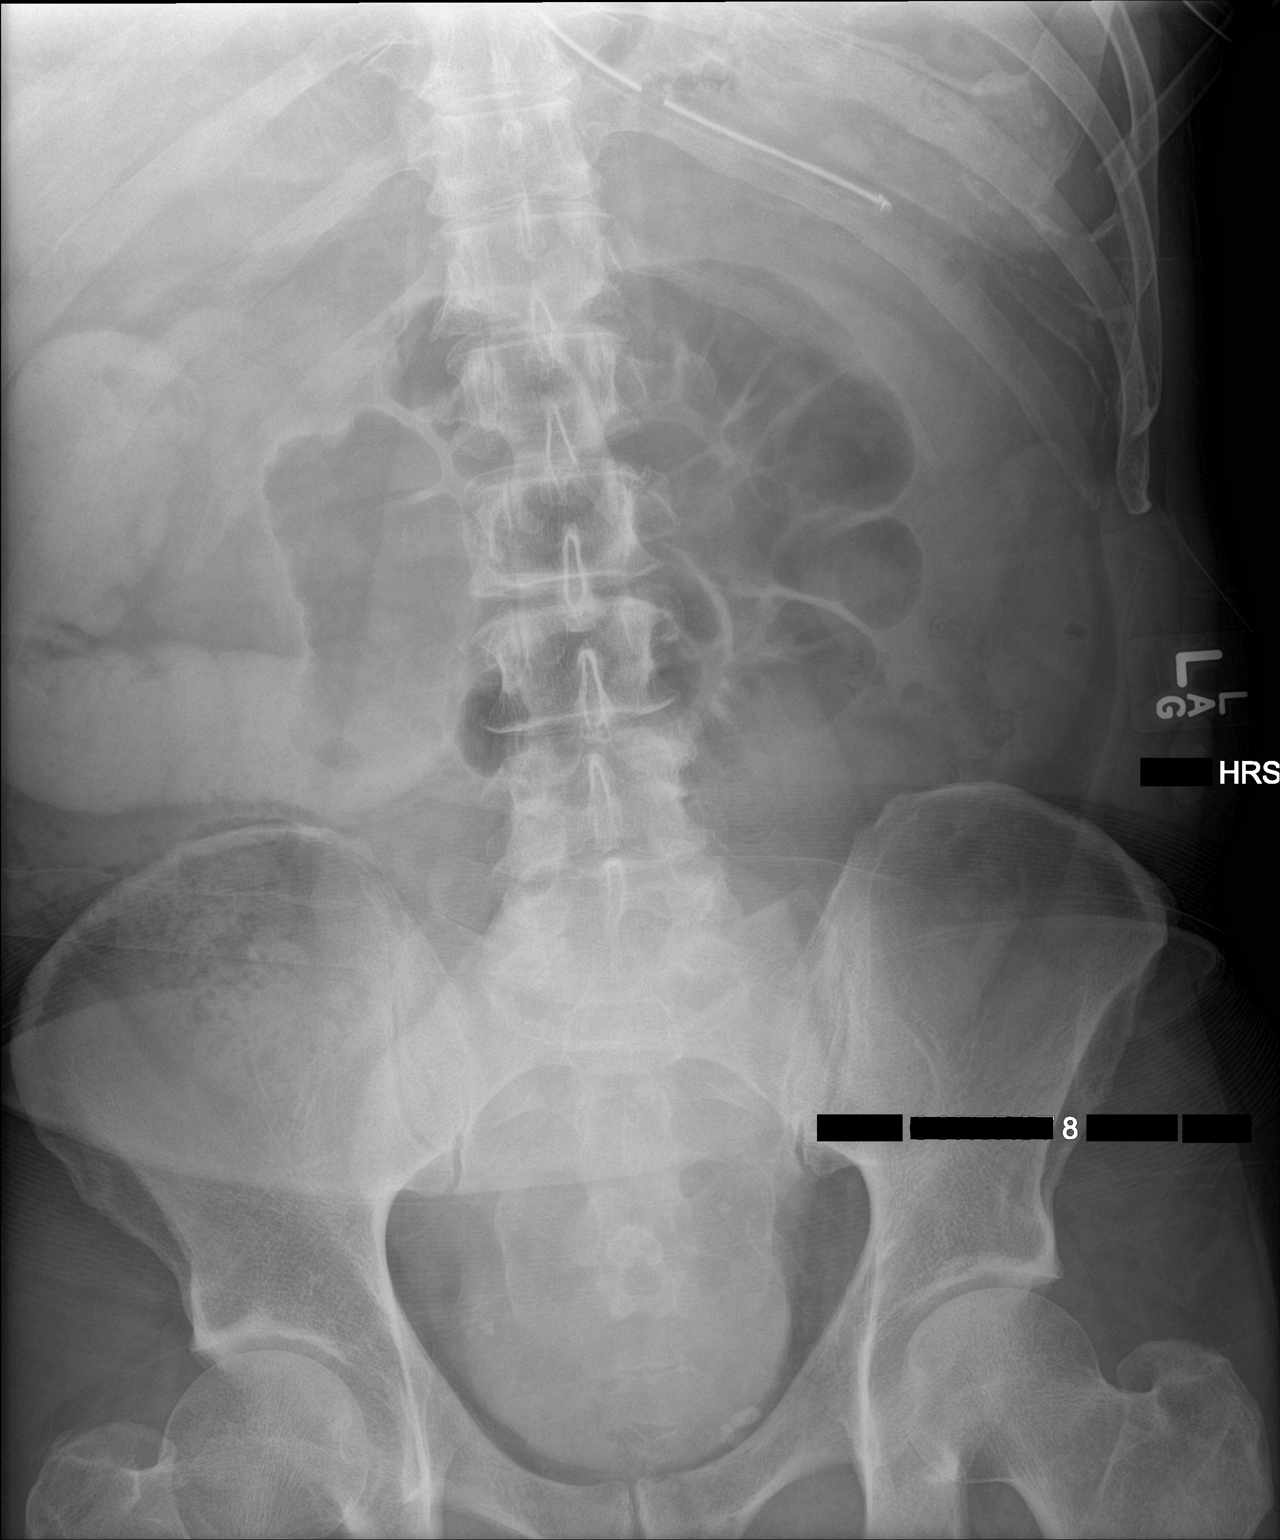

[1 of 1 positions shown; findings below may reference images not displayed]

FINDINGS: Contrast is noted at the right mid abdomen, likely within the mid to
distal small bowel. Residual stool is noted at at the cecum. There
is no evidence of contrast within the colon. This is concerning for
worsening small bowel obstruction, given distention of small-bowel
loops to 5.0 cm in diameter.

No acute osseous abnormalities are seen. An enteric tube is noted
ending overlying the body of the stomach.
IMPRESSION: Contrast has progressed likely to the mid to distal small bowel.
Increasing distention of small-bowel loops to 5.0 cm in diameter
raises concern for worsening small bowel obstruction.

## 2017-04-24 MED ORDER — PHENOL 1.4 % MT LIQD
1.0000 | OROMUCOSAL | Status: DC | PRN
  Filled 2017-04-24: qty 177

## 2017-04-24 NOTE — Progress Notes (Signed)
[facility]/Trauma Progress Note      Assessment/Plan HTN - IV hydralazine PRN  SBO - CT scan shows dilated ileum, fecalized contents and mild mesenteric edema with transition point at the terminal ileum - no hx of abdominal surgeries -8hr delay abdominal film after gastrografin administration showed contrast has progressed likely to the mid to distal small bowel. Increasing distention of small bowel loops to 5 cm. Raises concern for worsening SBO.  FEN: NPO, IVF, NGT VTE: SCD's, lovenox ID: none Foley: none  Plan: Repeat abdominal films in the a.m. Discussed with patient the likelihood that he will need surgery possibly tomorrow unless his films show otherwise. We continued to hope this will resolve without surgery and then a follow up colonoscopy   LOS: 1 day    Subjective:  CC: Abdominal pain  Patient states his pain is well-controlled with the pain meds. When they wear off he feels the pain again. He denies nausea, fever, chills. No BM since admission. Wife at bedside.  Objective: Vital signs in last 24 hours: Temp:  [97.9 F (36.6 C)-98.9 F (37.2 C)] 98.6 F (37 C) (08/16 1000) Pulse Rate:  [77-94] 77 (08/16 1000) Resp:  [18-22] 19 (08/16 1000) BP: (140-163)/(72-94) 140/72 (08/16 1000) SpO2:  [97 %-99 %] 99 % (08/16 1000) Last BM Date: 04/23/17  Intake/Output from previous day: 08/15 0701 - 08/16 0700 In: 1571.1 [P.O.:14; I.V.:1557.1] Out: 500 [Emesis/NG output:500] Intake/Output this shift: Total I/O In: -  Out: 650 [Urine:600; Emesis/NG output:50]  PE: Gen:  Alert, NAD, pleasant, cooperative, well-appearing Card:  RRR, no M/G/R heard Pulm:  CTA anteriorly, no W/R/R, effort normal Abd: Soft, not distended, tinkling bowel sounds appreciated in right hemiabdomen with few normal bowel sounds appreciated left hemiabdomen, no hernias appreciated, mild TTP and RLQ Skin: no rashes noted, warm and dry   Anti-infectives: Anti-infectives    None      Lab Results:   Recent Labs  04/23/17 0921  WBC 8.8  HGB 15.8  HCT 43.8  PLT 229   BMET  Recent Labs  04/23/17 0921  NA 135  K 4.0  CL 103  CO2 22  GLUCOSE 121*  BUN 13  CREATININE 0.82  CALCIUM 9.3   PT/INR No results for input(s): LABPROT, INR in the last 72 hours. CMP     Component Value Date/Time   NA 135 04/23/2017 0921   K 4.0 04/23/2017 0921   CL 103 04/23/2017 0921   CO2 22 04/23/2017 0921   GLUCOSE 121 (H) 04/23/2017 0921   BUN 13 04/23/2017 0921   CREATININE 0.82 04/23/2017 0921   CALCIUM 9.3 04/23/2017 0921   PROT 7.1 04/23/2017 0921   ALBUMIN 4.8 04/23/2017 0921   AST 35 04/23/2017 0921   ALT 38 04/23/2017 0921   ALKPHOS 52 04/23/2017 0921   BILITOT 1.2 04/23/2017 0921   GFRNONAA >60 04/23/2017 0921   GFRAA >60 04/23/2017 0921   Lipase     Component Value Date/Time   LIPASE 24 04/23/2017 0921    Studies/Results: Ct Abdomen Pelvis W Contrast  Result Date: 04/23/2017 CLINICAL DATA:  Awoke early this morning with abdominal pain. EXAM: CT ABDOMEN AND PELVIS WITH CONTRAST TECHNIQUE: Multidetector CT imaging of the abdomen and pelvis was performed using the standard protocol following bolus administration of intravenous contrast. CONTRAST:  174mL ISOVUE-300 IOPAMIDOL (ISOVUE-300) INJECTION 61% COMPARISON:  None. FINDINGS: Lower chest:  No contributory findings. Hepatobiliary: No focal liver abnormality.No evidence of biliary obstruction or stone. Pancreas: Unremarkable. Spleen: Unremarkable.  Adrenals/Urinary Tract: Negative adrenals. No hydronephrosis or stone. Unremarkable bladder. Stomach/Bowel: Ileum is dilated with fecalized contents. Abrupt transition of the terminal ileum best seen on coronal reformats, with no evidence of underlying ileitis or mass. There is associated mesenteric edema and trace peritoneal fluid. The appendix is seen and negative. Few colonic diverticula at the level of the sigmoid. Vascular/Lymphatic: No acute vascular  abnormality. No mass or adenopathy. Reproductive:Mild symmetric prostate enlargement. Other: No ascites or pneumoperitoneum. Musculoskeletal: Generalized disc narrowing with endplate degeneration greatest at L4-5 and L5-S1. IMPRESSION: Findings of early small bowel obstruction with dilated ileum, fecalized contents and mild mesenteric edema. A transition point is seen at the terminal ileum. Electronically Signed   By: Monte Fantasia M.D.   On: 04/23/2017 10:45   Dg Abd Portable 1v-small Bowel Obstruction Protocol-initial, 8 Hr Delay  Result Date: 04/24/2017 CLINICAL DATA:  Small-bowel obstruction protocol. 8 hour delayed film. Initial encounter. EXAM: PORTABLE ABDOMEN - 1 VIEW COMPARISON:  CT of the abdomen and pelvis performed earlier today at 10:32 a.m. FINDINGS: Contrast is noted at the right mid abdomen, likely within the mid to distal small bowel. Residual stool is noted at at the cecum. There is no evidence of contrast within the colon. This is concerning for worsening small bowel obstruction, given distention of small-bowel loops to 5.0 cm in diameter. No acute osseous abnormalities are seen. An enteric tube is noted ending overlying the body of the stomach. IMPRESSION: Contrast has progressed likely to the mid to distal small bowel. Increasing distention of small-bowel loops to 5.0 cm in diameter raises concern for worsening small bowel obstruction. Electronically Signed   By: Garald Balding M.D.   On: 04/24/2017 00:37   Dg Abd Portable 1 View  Result Date: 04/23/2017 CLINICAL DATA:  Nasogastric tube placement. EXAM: PORTABLE ABDOMEN - 1 VIEW COMPARISON:  None. FINDINGS: The bowel gas pattern is normal. Distal tip of nasogastric tube is seen in expected position of proximal stomach. No radio-opaque calculi or other significant radiographic abnormality are seen. IMPRESSION: No evidence of bowel obstruction or ileus. Distal tip of nasogastric tube is seen in expected position of proximal stomach.  Electronically Signed   By: Marijo Conception, M.D.   On: 04/23/2017 11:37      Kalman Drape , [facility] 04/24/2017, 11:23 AM Pager: [phone] Consults: [phone] Mon-Fri 7:00 am-4:30 pm Sat-Sun 7:00 am-11:30 am

## 2017-04-25 ENCOUNTER — Inpatient Hospital Stay (HOSPITAL_COMMUNITY): Payer: 59 | Admitting: Certified Registered Nurse Anesthetist

## 2017-04-25 ENCOUNTER — Inpatient Hospital Stay (HOSPITAL_COMMUNITY): Payer: 59

## 2017-04-25 ENCOUNTER — Encounter (HOSPITAL_COMMUNITY): Admission: EM | Disposition: A | Discharge status: Home / Self Care | Payer: Self-pay | Source: Home / Self Care

## 2017-04-25 ENCOUNTER — Encounter (HOSPITAL_COMMUNITY): Payer: Self-pay | Admitting: General Surgery

## 2017-04-25 HISTORY — PX: LAPAROSCOPY: SHX197

## 2017-04-25 LAB — SURGICAL PCR SCREEN
MRSA, PCR: NEGATIVE
Staphylococcus aureus: POSITIVE — AB

## 2017-04-25 LAB — CBC
HCT: 42.4 % (ref 39.0–52.0)
HEMOGLOBIN: 14.8 g/dL (ref 13.0–17.0)
MCH: 34 pg (ref 26.0–34.0)
MCHC: 34.9 g/dL (ref 30.0–36.0)
MCV: 97.5 fL (ref 78.0–100.0)
Platelets: 211 10*3/uL (ref 150–400)
RBC: 4.35 MIL/uL (ref 4.22–5.81)
RDW: 12.1 % (ref 11.5–15.5)
WBC: 11.6 10*3/uL — AB (ref 4.0–10.5)

## 2017-04-25 IMAGING — DX DG ABDOMEN 2V
2 series · 2 of 2 positions shown · non-contrast
Comparison: Radiographs dated [DATE] and [DATE] and CT scan
dated [DATE]

CLINICAL DATA: Small bowel obstruction.

EXAM:
ABDOMEN - 2 VIEW

[abdomen erect]
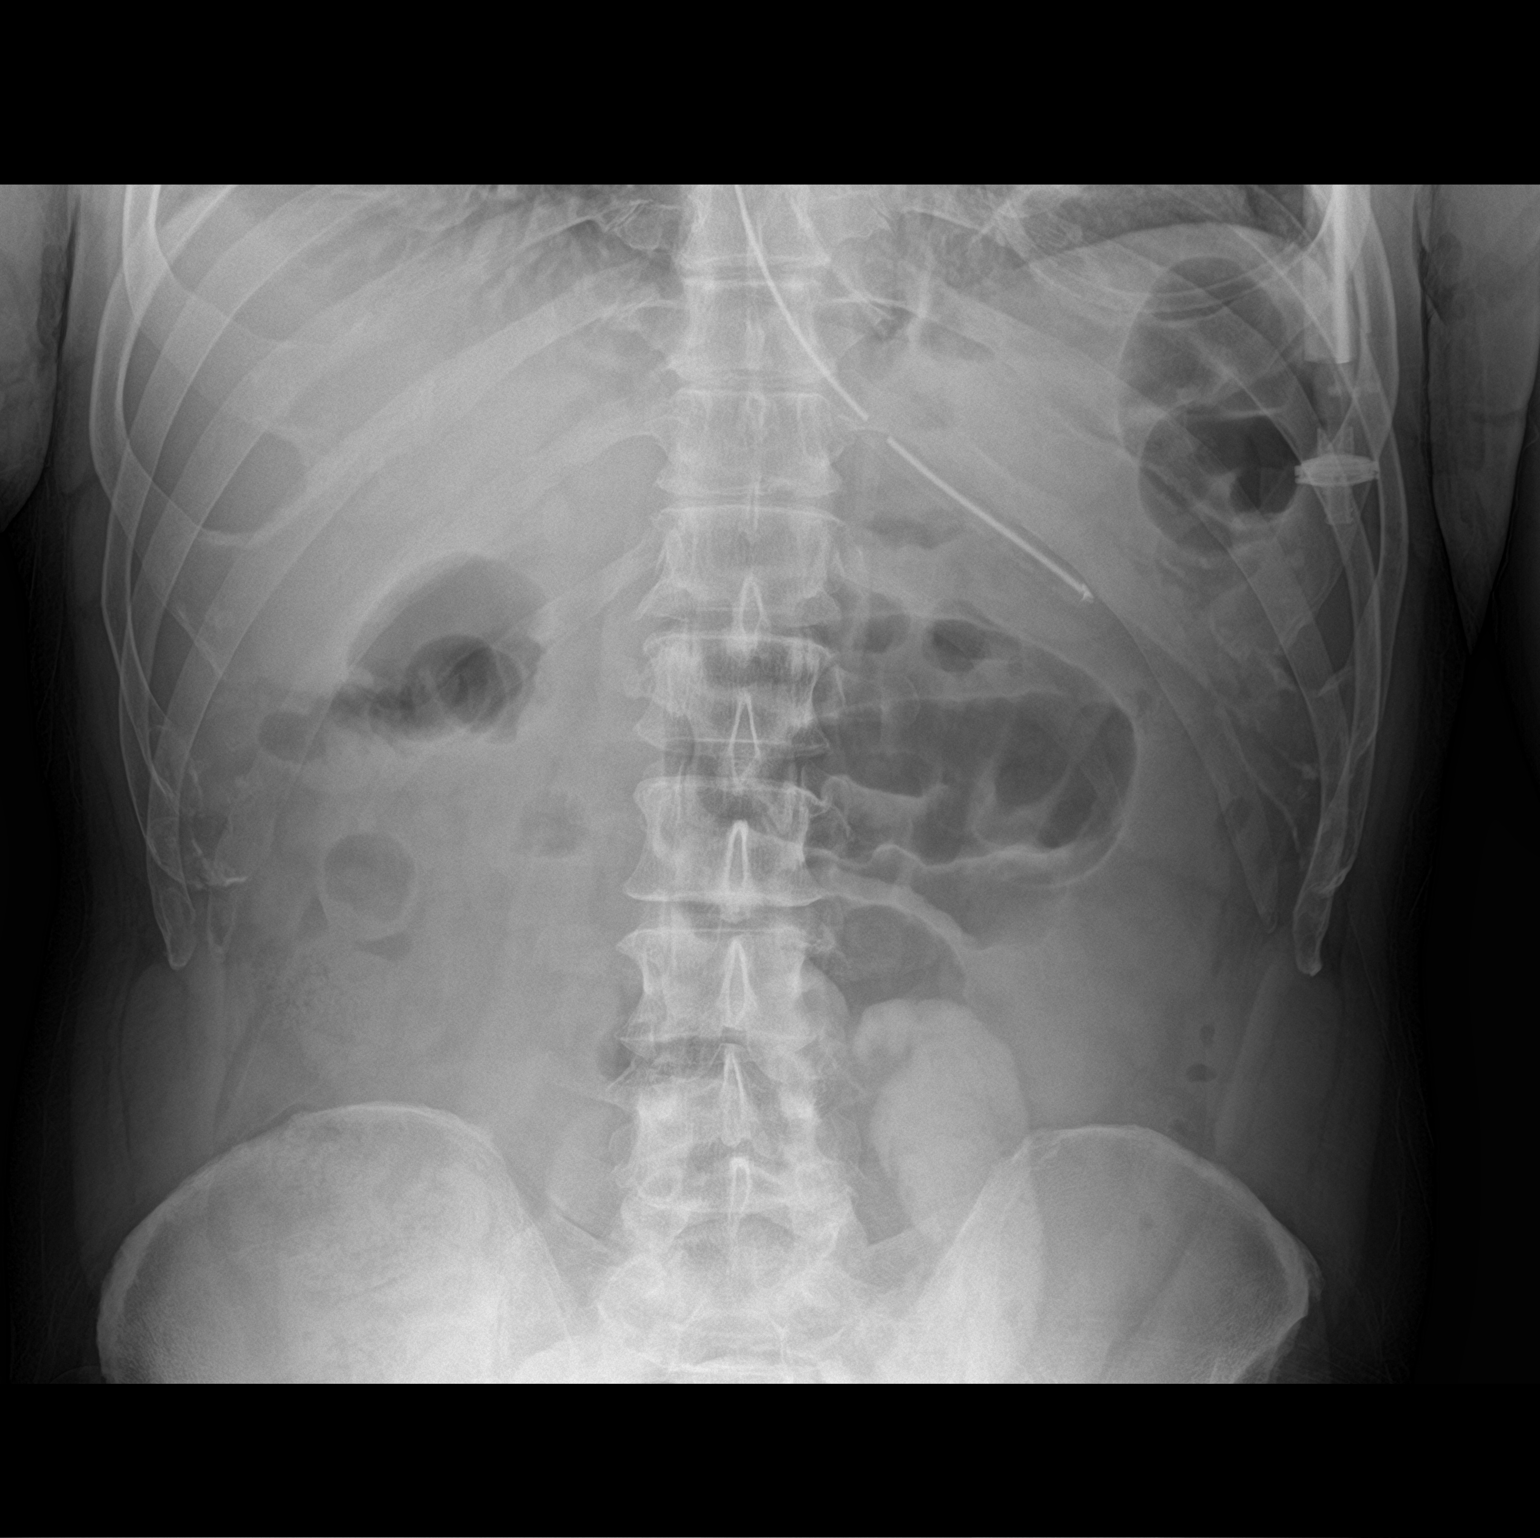

[abdomen supine]
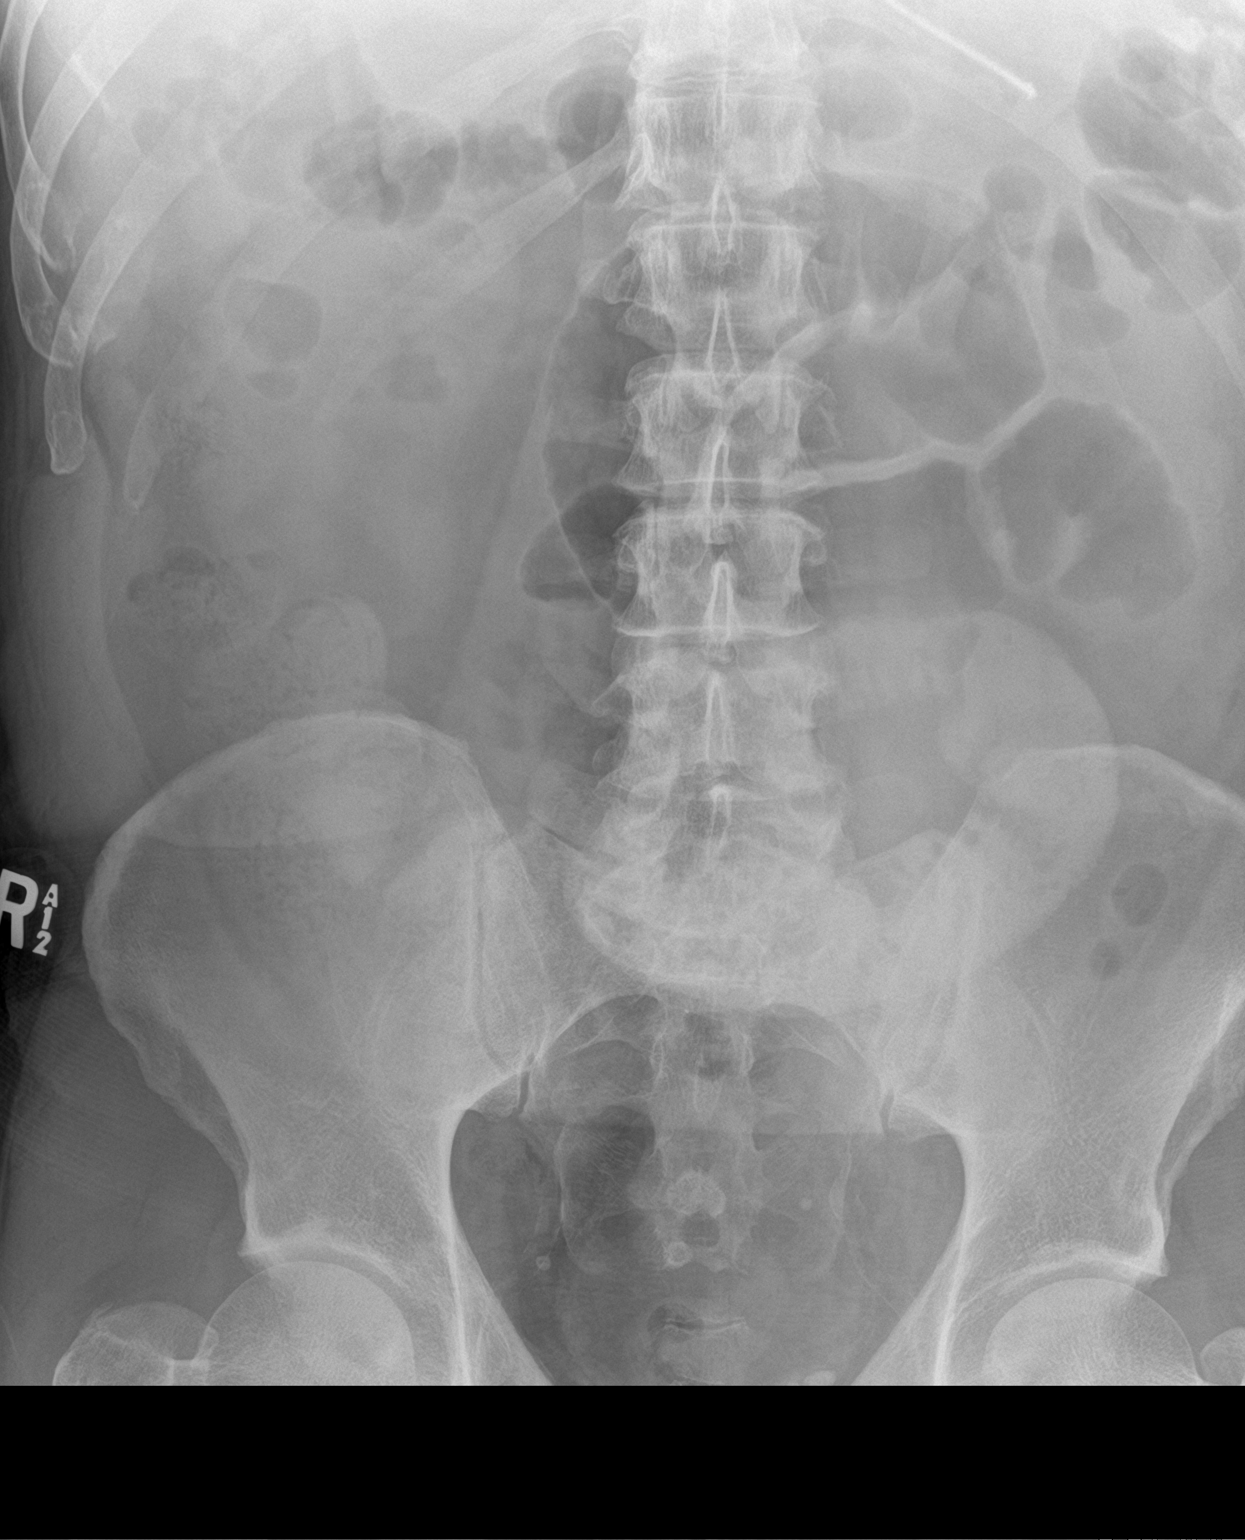

[2 of 2 positions shown; findings below may reference images not displayed]

FINDINGS: NG tube tip in the fundus of the stomach. Persistent dilated loops
of small bowel in the mid abdomen. Colon is not distended. Contrast
remains in multiple small bowel loops.
IMPRESSION: Persistent small bowel obstruction.

## 2017-04-25 SURGERY — LAPAROSCOPY, DIAGNOSTIC
Anesthesia: General | Site: Abdomen

## 2017-04-25 MED ORDER — DEXAMETHASONE SODIUM PHOSPHATE 10 MG/ML IJ SOLN
INTRAMUSCULAR | Status: AC
  Filled 2017-04-25: qty 1

## 2017-04-25 MED ORDER — BUPIVACAINE-EPINEPHRINE 0.25% -1:200000 IJ SOLN
INTRAMUSCULAR | Status: DC | PRN
  Administered 2017-04-25: 30 mL

## 2017-04-25 MED ORDER — 0.9 % SODIUM CHLORIDE (POUR BTL) OPTIME
TOPICAL | Status: DC | PRN
  Administered 2017-04-25: 4000 mL

## 2017-04-25 MED ORDER — SUCCINYLCHOLINE CHLORIDE 200 MG/10ML IV SOSY
PREFILLED_SYRINGE | INTRAVENOUS | Status: DC | PRN
  Administered 2017-04-25: 140 mg via INTRAVENOUS

## 2017-04-25 MED ORDER — DEXTROSE 5 % IV SOLN
2.0000 g | INTRAVENOUS | Status: AC
  Administered 2017-04-25 (×2): 2 g via INTRAVENOUS
  Filled 2017-04-25: qty 2

## 2017-04-25 MED ORDER — OXYCODONE HCL 5 MG/5ML PO SOLN
5.0000 mg | Freq: Once | ORAL | Status: DC | PRN
  Filled 2017-04-25: qty 5

## 2017-04-25 MED ORDER — PROPOFOL 10 MG/ML IV BOLUS
INTRAVENOUS | Status: AC
  Filled 2017-04-25: qty 20

## 2017-04-25 MED ORDER — ONDANSETRON HCL 4 MG/2ML IJ SOLN
INTRAMUSCULAR | Status: DC | PRN
  Administered 2017-04-25: 4 mg via INTRAVENOUS

## 2017-04-25 MED ORDER — LIDOCAINE 2% (20 MG/ML) 5 ML SYRINGE
INTRAMUSCULAR | Status: AC
  Filled 2017-04-25: qty 5

## 2017-04-25 MED ORDER — ONDANSETRON HCL 4 MG/2ML IJ SOLN: 4.0000 mg | Freq: Four times a day (QID) | INTRAMUSCULAR | Status: DC | PRN

## 2017-04-25 MED ORDER — LACTATED RINGERS IR SOLN
Status: DC | PRN
  Administered 2017-04-25: 1000 mL

## 2017-04-25 MED ORDER — SUGAMMADEX SODIUM 200 MG/2ML IV SOLN
INTRAVENOUS | Status: DC | PRN
  Administered 2017-04-25: 200 mg via INTRAVENOUS

## 2017-04-25 MED ORDER — MORPHINE SULFATE (PF) 4 MG/ML IV SOLN
4.0000 mg | INTRAVENOUS | Status: DC | PRN
  Administered 2017-04-25 – 2017-04-27 (×10): 4 mg via INTRAVENOUS
  Filled 2017-04-25 (×10): qty 1

## 2017-04-25 MED ORDER — FENTANYL CITRATE (PF) 100 MCG/2ML IJ SOLN
INTRAMUSCULAR | Status: DC | PRN
  Administered 2017-04-25 (×2): 50 ug via INTRAVENOUS
  Administered 2017-04-25: 100 ug via INTRAVENOUS
  Administered 2017-04-25 (×2): 50 ug via INTRAVENOUS

## 2017-04-25 MED ORDER — ROCURONIUM BROMIDE 50 MG/5ML IV SOSY
PREFILLED_SYRINGE | INTRAVENOUS | Status: DC | PRN
  Administered 2017-04-25: 50 mg via INTRAVENOUS
  Administered 2017-04-25 (×5): 10 mg via INTRAVENOUS

## 2017-04-25 MED ORDER — ENOXAPARIN SODIUM 40 MG/0.4ML ~~LOC~~ SOLN
40.0000 mg | SUBCUTANEOUS | Status: DC
Start: 2017-04-26 — End: 2017-04-28
  Administered 2017-04-26 – 2017-04-27 (×2): 40 mg via SUBCUTANEOUS
  Filled 2017-04-25 (×2): qty 0.4

## 2017-04-25 MED ORDER — ONDANSETRON HCL 4 MG/2ML IJ SOLN
INTRAMUSCULAR | Status: AC
  Filled 2017-04-25: qty 2

## 2017-04-25 MED ORDER — HYDROMORPHONE HCL-NACL 0.5-0.9 MG/ML-% IV SOSY
0.2500 mg | PREFILLED_SYRINGE | INTRAVENOUS | Status: DC | PRN
  Administered 2017-04-25 (×2): 0.25 mg via INTRAVENOUS

## 2017-04-25 MED ORDER — MIDAZOLAM HCL 5 MG/5ML IJ SOLN
INTRAMUSCULAR | Status: DC | PRN
  Administered 2017-04-25: 2 mg via INTRAVENOUS

## 2017-04-25 MED ORDER — ROCURONIUM BROMIDE 50 MG/5ML IV SOSY
PREFILLED_SYRINGE | INTRAVENOUS | Status: AC
Start: 2017-04-25 — End: 2017-04-25
  Filled 2017-04-25: qty 5

## 2017-04-25 MED ORDER — FENTANYL CITRATE (PF) 250 MCG/5ML IJ SOLN
INTRAMUSCULAR | Status: AC
  Filled 2017-04-25: qty 5

## 2017-04-25 MED ORDER — LIDOCAINE 2% (20 MG/ML) 5 ML SYRINGE
INTRAMUSCULAR | Status: DC | PRN
  Administered 2017-04-25: 100 mg via INTRAVENOUS

## 2017-04-25 MED ORDER — ROCURONIUM BROMIDE 50 MG/5ML IV SOSY
PREFILLED_SYRINGE | INTRAVENOUS | Status: AC
  Filled 2017-04-25: qty 5

## 2017-04-25 MED ORDER — HYDROMORPHONE HCL-NACL 0.5-0.9 MG/ML-% IV SOSY
PREFILLED_SYRINGE | INTRAVENOUS | Status: AC
  Administered 2017-04-25: 0.25 mg via INTRAVENOUS
  Filled 2017-04-25: qty 1

## 2017-04-25 MED ORDER — DEXTROSE 5 % IV SOLN
2.0000 g | Freq: Once | INTRAVENOUS | Status: DC
  Filled 2017-04-25: qty 2

## 2017-04-25 MED ORDER — DEXAMETHASONE SODIUM PHOSPHATE 10 MG/ML IJ SOLN
INTRAMUSCULAR | Status: DC | PRN
  Administered 2017-04-25: 10 mg via INTRAVENOUS

## 2017-04-25 MED ORDER — POLYVINYL ALCOHOL 1.4 % OP SOLN
1.0000 [drp] | OPHTHALMIC | Status: DC | PRN
  Filled 2017-04-25: qty 15

## 2017-04-25 MED ORDER — LACTATED RINGERS IV SOLN
INTRAVENOUS | Status: DC | PRN
  Administered 2017-04-25 (×3): via INTRAVENOUS

## 2017-04-25 MED ORDER — PROPOFOL 10 MG/ML IV BOLUS
INTRAVENOUS | Status: DC | PRN
  Administered 2017-04-25: 200 mg via INTRAVENOUS

## 2017-04-25 MED ORDER — MIDAZOLAM HCL 2 MG/2ML IJ SOLN
INTRAMUSCULAR | Status: AC
  Filled 2017-04-25: qty 2

## 2017-04-25 MED ORDER — BUPIVACAINE LIPOSOME 1.3 % IJ SUSP
20.0000 mL | Freq: Once | INTRAMUSCULAR | Status: AC
  Administered 2017-04-25: 20 mL
  Filled 2017-04-25: qty 20

## 2017-04-25 MED ORDER — OXYCODONE HCL 5 MG PO TABS: 5.0000 mg | ORAL_TABLET | Freq: Once | ORAL | Status: DC | PRN

## 2017-04-25 MED ORDER — POLYMYXIN B-TRIMETHOPRIM 10000-0.1 UNIT/ML-% OP SOLN
1.0000 [drp] | Freq: Three times a day (TID) | OPHTHALMIC | Status: AC
  Administered 2017-04-25 – 2017-04-26 (×3): 1 [drp] via OPHTHALMIC
  Filled 2017-04-25 (×2): qty 10

## 2017-04-25 MED ORDER — BUPIVACAINE-EPINEPHRINE (PF) 0.25% -1:200000 IJ SOLN
INTRAMUSCULAR | Status: AC
  Filled 2017-04-25: qty 30

## 2017-04-25 MED ORDER — SUCCINYLCHOLINE CHLORIDE 200 MG/10ML IV SOSY
PREFILLED_SYRINGE | INTRAVENOUS | Status: AC
  Filled 2017-04-25: qty 10

## 2017-04-25 MED ORDER — KETOROLAC TROMETHAMINE 0.5 % OP SOLN
1.0000 [drp] | Freq: Three times a day (TID) | OPHTHALMIC | Status: AC | PRN
Start: 2017-04-25 — End: 2017-04-26
  Administered 2017-04-25: 1 [drp] via OPHTHALMIC
  Filled 2017-04-25: qty 5

## 2017-04-25 SURGICAL SUPPLY — 48 items
BENZOIN TINCTURE PRP APPL 2/3 (GAUZE/BANDAGES/DRESSINGS) IMPLANT
CELLS DAT CNTRL 66122 CELL SVR (MISCELLANEOUS) ×1 IMPLANT
COVER SURGICAL LIGHT HANDLE (MISCELLANEOUS) ×4 IMPLANT
DECANTER SPIKE VIAL GLASS SM (MISCELLANEOUS) IMPLANT
DERMABOND ADVANCED (GAUZE/BANDAGES/DRESSINGS)
DERMABOND ADVANCED .7 DNX12 (GAUZE/BANDAGES/DRESSINGS) IMPLANT
DRSG OPSITE POSTOP 4X6 (GAUZE/BANDAGES/DRESSINGS) ×2 IMPLANT
DRSG TEGADERM 2-3/8X2-3/4 SM (GAUZE/BANDAGES/DRESSINGS) ×2 IMPLANT
ELECT REM PT RETURN 15FT ADLT (MISCELLANEOUS) ×2 IMPLANT
GAUZE SPONGE 2X2 8PLY STRL LF (GAUZE/BANDAGES/DRESSINGS) ×1 IMPLANT
GLOVE BIOGEL PI IND STRL 7.0 (GLOVE) ×6 IMPLANT
GLOVE BIOGEL PI INDICATOR 7.0 (GLOVE) ×6
GOWN STRL REUS W/TWL LRG LVL3 (GOWN DISPOSABLE) ×8 IMPLANT
GOWN STRL REUS W/TWL XL LVL3 (GOWN DISPOSABLE) ×8 IMPLANT
HOLDER FOLEY CATH W/STRAP (MISCELLANEOUS) ×2 IMPLANT
IRRIG SUCT STRYKERFLOW 2 WTIP (MISCELLANEOUS)
IRRIGATION SUCT STRKRFLW 2 WTP (MISCELLANEOUS) IMPLANT
KIT BASIN OR (CUSTOM PROCEDURE TRAY) IMPLANT
LIGASURE IMPACT 36 18CM CVD LR (INSTRUMENTS) ×2 IMPLANT
PACK COLON (CUSTOM PROCEDURE TRAY) ×2 IMPLANT
PAD POSITIONING PINK XL (MISCELLANEOUS) ×2 IMPLANT
RELOAD PROXIMATE 75MM BLUE (ENDOMECHANICALS) ×10 IMPLANT
RELOAD PROXIMATE TA60MM BLUE (ENDOMECHANICALS) ×2 IMPLANT
RTRCTR WOUND ALEXIS 18CM MED (MISCELLANEOUS) ×2
SET IRRIG TUBING LAPAROSCOPIC (IRRIGATION / IRRIGATOR) ×2 IMPLANT
SHEARS HARMONIC ACE PLUS 36CM (ENDOMECHANICALS) ×2 IMPLANT
SLEEVE XCEL OPT CAN 5 100 (ENDOMECHANICALS) ×2 IMPLANT
SOLUTION ANTI FOG 6CC (MISCELLANEOUS) ×2 IMPLANT
SPONGE GAUZE 2X2 STER 10/PKG (GAUZE/BANDAGES/DRESSINGS) ×1
STAPLER GUN LINEAR PROX 60 (STAPLE) ×2 IMPLANT
STAPLER PROXIMATE 75MM BLUE (STAPLE) ×2 IMPLANT
STRIP CLOSURE SKIN 1/2X4 (GAUZE/BANDAGES/DRESSINGS) IMPLANT
SUT PDS AB 1 CT1 27 (SUTURE) ×4 IMPLANT
SUT SILK 2 0 (SUTURE) ×1
SUT SILK 2 0 SH CR/8 (SUTURE) ×4 IMPLANT
SUT SILK 2-0 18XBRD TIE 12 (SUTURE) ×1 IMPLANT
SUT SILK 3 0 (SUTURE) ×1
SUT SILK 3 0 SH CR/8 (SUTURE) ×4 IMPLANT
SUT SILK 3-0 18XBRD TIE 12 (SUTURE) ×1 IMPLANT
SUT VIC AB 4-0 PS2 27 (SUTURE) IMPLANT
TOWEL OR 17X26 10 PK STRL BLUE (TOWEL DISPOSABLE) IMPLANT
TRAY FOLEY W/METER SILVER 16FR (SET/KITS/TRAYS/PACK) ×2 IMPLANT
TRAY LAPAROSCOPIC (CUSTOM PROCEDURE TRAY) IMPLANT
TROCAR XCEL BLUNT TIP 100MML (ENDOMECHANICALS) ×2 IMPLANT
TROCAR XCEL NON-BLD 11X100MML (ENDOMECHANICALS) IMPLANT
TROCAR XCEL NON-BLD 5MMX100MML (ENDOMECHANICALS) ×2 IMPLANT
TROCAR XCEL UNIV SLVE 11M 100M (ENDOMECHANICALS) IMPLANT
TUBING INSUF HEATED (TUBING) ×2 IMPLANT

## 2017-04-25 NOTE — Progress Notes (Signed)
Patient lethargic with meds.Encouraged to use  The incentive spirometer. Surgical incision sites clean,dry and intact.Vital signs obtained.

## 2017-04-25 NOTE — Progress Notes (Signed)
L painful eye irrigated gently with balanced saline solution. Ketorolac opthalmic solution administered per order.  Will continue to monitor.

## 2017-04-25 NOTE — Op Note (Signed)
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 This has failed to resolve with conservative management and I have recommended proceeding with laparoscopy and possible laparotomy, possible bowel resection. The procedure and indications, possible findings and risks have been discussed with the family and the patient and they agree to proceed.    Procedure Detail:  Patient was brought to the operating room, placed in the supine position on the operating table, and general endotracheal anesthesia induced. He received preoperative IV antibiotics. Foley catheter was placed. PIS were in place. The abdomen was widely sterilely prepped and draped. Patient timeout was performed and correct procedure verified. Access was obtained with a 1 cm vertical incision just beneath the umbilicus with an open Hassan technique and pneumoperitoneum established. Under direct vision 5 mm trochars were placed in the upper midline and left lower quadrant. There were noted to be multiple dilated congested loops of terminal ileum. The cecum and appendix were identified. There was noted to be a firm mass which was involving the terminal ileum about 2 feet from the ileocecal valve with this area being extremely adherent and fixed to the extreme terminal ileum just at the ileocecal valve. This was all matted together and was difficult to tell if the obstruction was at the ileocecal valve  itself or the somewhat more proximal ileum and indeed whether this was inflammatory or neoplastic. This was also involving the mesentery of the appendix but not the appendix itself. I felt this clearly had to be resected. Laparoscopically the right colon was extensively mobilized dividing lateral peritoneal attachments and taking this up to the hepatic flexure and completely mobilizing the right colon medially. The peritoneum at the mesentery of the terminal ileum was also incised mobilizing the terminal ileum. Following full laparoscopic mobilization I extended the infraumbilical incision up around the umbilicus making about a 6 cm incision. A wound protector was placed. At this point I could deliver the terminal ileum and the cecum and proximal right colon out through the incision. Findings were as described above. Again I could not really determine if this was neoplastic or inflammatory. It was densely involving the wall of the terminal ileum 2 feet from the ileocecal valve but that also densely adherent right at the ileocecal valve with narrowing here and involvement of the mesial appendix. The intervening 2 feet of terminal ileum between the ileocecal valve. The area of adherence was normal. I elected to do 2 resections in order to preserve the at least 2 feet of terminal ileum. Initially the ileum proximally at the site of the adherent mass was divided several centimeters proximally and distally to this with the GIA 75 mm stapler. The mesentery of the small segment was then divided with the harmonic scalpel leaving this attached to the ileocecal valve. A functional end-to-end and end-to-end anastomosis was created with the GIA 75 mm stapler and closed with a TA 60 stapler. The crotch of the anastomosis was reinforced with silk. The anastomosis was widely patent and under no tension with good blood supply. The mesenteric defect was closed with interrupted  silks. Following this I proceeded with resection of the  terminal ileum and cecum. Both areas were cleared of mesentery at the terminal ileum about 6 or 8 inches from the ileocecal valve and at the mid right colon. These were divided with the GIA 75 mm stapler. The intervening mesentery was sequentially divided with the Harmonic scalpel staying posterior to the mass in the mesentery. The specimen was removed en bloc. Following this an anastomosis was created between the ileum and the mid right colon with a single firing of the GIA 75 mm stapler. The common enterotomy was closed with a TA 60 stapler. The crotch of the anastomosis was reinforced with silk and the mesenteric defect closed with silk. Again the anastomosis was widely patent and under no tension and appeared to have excellent blood supply. I did send the specimen for frozen section which indicated a Meckel's diverticulum with chronic inflammation and adhesions. Following this all drapes and gloves gowns and instruments were changed and the patient redraped. The abdomen was thoroughly irrigated with saline and hemostasis assured. No evidence of injury or other problems. The viscera returned to their anatomic position. The soft tissue around the wound was infiltrated with dilute Exparel. The midline fascia was closed with running #1 PDS begun at either end of the incision and tied centrally. Subcutaneous tissue was irrigated and the wounds closed with staples. Dry sterile dressings applied. Sponge needle and instrument counts were correct.    Findings: As above  Estimated Blood Loss:  less than 100 mL         Drains: None  Blood Given: none          Specimens: En bloc resection of terminal ileum and cecum and additional segment of ileum        Complications:  * No complications entered in OR log *         Disposition: PACU - hemodynamically stable.         Condition: stable

## 2017-04-25 NOTE — Progress Notes (Signed)
Called to patient room with complaints of left eye pain. Artificial tears ordered by surgeon but not alleviating discomfort.  Difficult to examine eye due to squinting. Will place on Corneal abrasion protocol and follow up tomorrow.

## 2017-04-25 NOTE — Progress Notes (Signed)
Patient ID: Christopher Carter, male   DOB: 10-02-1960, 56 y.o.   MRN: [id number]     Subjective: Still with abdominal pain and bloating unchanged. No bowel function.  Objective: Vital signs in last 24 hours: Temp:  [98.5 F (36.9 C)-100 F (37.8 C)] 98.5 F (36.9 C) (08/17 0600) Pulse Rate:  [77-94] 94 (08/17 0600) Resp:  [17-19] 18 (08/17 0600) BP: (140-164)/(72-83) 148/81 (08/17 0600) SpO2:  [94 %-99 %] 94 % (08/17 0600) Last BM Date: 04/23/17  Intake/Output from previous day: 08/16 0701 - 08/17 0700 In: 2430 [P.O.:30; I.V.:2400] Out: 2150 [Urine:900; Emesis/NG output:1250] Intake/Output this shift: Total I/O In: -  Out: 400 [Urine:400]  General appearance: alert, cooperative and no distress GI: Mild distention. Mild diffuse tenderness. NG bilious.  Lab Results:   Recent Labs  04/23/17 0921 04/25/17 0732  WBC 8.8 11.6*  HGB 15.8 14.8  HCT 43.8 42.4  PLT 229 211   BMET  Recent Labs  04/23/17 0921  NA 135  K 4.0  CL 103  CO2 22  GLUCOSE 121*  BUN 13  CREATININE 0.82  CALCIUM 9.3     Studies/Results: Ct Abdomen Pelvis W Contrast  Result Date: 04/23/2017 CLINICAL DATA:  Awoke early this morning with abdominal pain. EXAM: CT ABDOMEN AND PELVIS WITH CONTRAST TECHNIQUE: Multidetector CT imaging of the abdomen and pelvis was performed using the standard protocol following bolus administration of intravenous contrast. CONTRAST:  146mL ISOVUE-300 IOPAMIDOL (ISOVUE-300) INJECTION 61% COMPARISON:  None. FINDINGS: Lower chest:  No contributory findings. Hepatobiliary: No focal liver abnormality.No evidence of biliary obstruction or stone. Pancreas: Unremarkable. Spleen: Unremarkable. Adrenals/Urinary Tract: Negative adrenals. No hydronephrosis or stone. Unremarkable bladder. Stomach/Bowel: Ileum is dilated with fecalized contents. Abrupt transition of the terminal ileum best seen on coronal reformats, with no evidence of underlying ileitis or mass. There is  associated mesenteric edema and trace peritoneal fluid. The appendix is seen and negative. Few colonic diverticula at the level of the sigmoid. Vascular/Lymphatic: No acute vascular abnormality. No mass or adenopathy. Reproductive:Mild symmetric prostate enlargement. Other: No ascites or pneumoperitoneum. Musculoskeletal: Generalized disc narrowing with endplate degeneration greatest at L4-5 and L5-S1. IMPRESSION: Findings of early small bowel obstruction with dilated ileum, fecalized contents and mild mesenteric edema. A transition point is seen at the terminal ileum. Electronically Signed   By: Monte Fantasia M.D.   On: 04/23/2017 10:45   Dg Abd 2 Views  Result Date: 04/25/2017 CLINICAL DATA:  Small bowel obstruction. EXAM: ABDOMEN - 2 VIEW COMPARISON:  Radiographs dated 04/24/2018 and 04/23/2017 and CT scan dated 04/23/2017 FINDINGS: NG tube tip in the fundus of the stomach. Persistent dilated loops of small bowel in the mid abdomen. Colon is not distended. Contrast remains in multiple small bowel loops. IMPRESSION: Persistent small bowel obstruction. Electronically Signed   By: Lorriane Shire M.D.   On: 04/25/2017 08:35   Dg Abd Portable 1v-small Bowel Obstruction Protocol-initial, 8 Hr Delay  Result Date: 04/24/2017 CLINICAL DATA:  Small-bowel obstruction protocol. 8 hour delayed film. Initial encounter. EXAM: PORTABLE ABDOMEN - 1 VIEW COMPARISON:  CT of the abdomen and pelvis performed earlier today at 10:32 a.m. FINDINGS: Contrast is noted at the right mid abdomen, likely within the mid to distal small bowel. Residual stool is noted at at the cecum. There is no evidence of contrast within the colon. This is concerning for worsening small bowel obstruction, given distention of small-bowel loops to 5.0 cm in diameter. No acute osseous abnormalities are seen. An enteric tube is  noted ending overlying the body of the stomach. IMPRESSION: Contrast has progressed likely to the mid to distal small bowel.  Increasing distention of small-bowel loops to 5.0 cm in diameter raises concern for worsening small bowel obstruction. Electronically Signed   By: Garald Balding M.D.   On: 04/24/2017 00:37   Dg Abd Portable 1 View  Result Date: 04/23/2017 CLINICAL DATA:  Nasogastric tube placement. EXAM: PORTABLE ABDOMEN - 1 VIEW COMPARISON:  None. FINDINGS: The bowel gas pattern is normal. Distal tip of nasogastric tube is seen in expected position of proximal stomach. No radio-opaque calculi or other significant radiographic abnormality are seen. IMPRESSION: No evidence of bowel obstruction or ileus. Distal tip of nasogastric tube is seen in expected position of proximal stomach. Electronically Signed   By: Marijo Conception, M.D.   On: 04/23/2017 11:37    Anti-infectives: Anti-infectives    Start     Dose/Rate Route Frequency Ordered Stop   04/25/17 0915  cefOXitin (MEFOXIN) 2 g in dextrose 5 % 50 mL IVPB     2 g 100 mL/hr over 30 Minutes Intravenous NOW 04/25/17 0912 04/26/17 0915      Assessment/Plan: Persistent small bowel obstruction terminal ileum in patient with no previous surgery. No improvement with conservative management. I have recommended as we discussed yesterday proceeding with diagnostic laparoscopy with quite likely laparotomy and potential bowel resection. I discussed the nature and indications of the procedure with the patient and his wife. We discussed possible findings and risks of anesthetic complications, bleeding, infection, blood clots and slight risk of anastomotic leak. All questions were answered and they agree to proceed.     LOS: 2 days    Eara Burruel T 04/25/2017

## 2017-04-25 NOTE — Anesthesia Preprocedure Evaluation (Signed)
Anesthesia Evaluation  Patient identified by MRN, date of birth, ID band Patient awake    Reviewed: Allergy & Precautions, H&P , NPO status , Patient's Chart, lab work & pertinent test results  Airway Mallampati: II   Neck ROM: full    Dental   Pulmonary neg pulmonary ROS,    breath sounds clear to auscultation       Cardiovascular hypertension,  Rhythm:regular Rate:Normal     Neuro/Psych    GI/Hepatic SBO   Endo/Other    Renal/GU      Musculoskeletal   Abdominal   Peds  Hematology   Anesthesia Other Findings   Reproductive/Obstetrics                             Anesthesia Physical Anesthesia Plan  ASA: II  Anesthesia Plan: General   Post-op Pain Management:    Induction: Intravenous  PONV Risk Score and Plan:   Airway Management Planned: Oral ETT  Additional Equipment:   Intra-op Plan:   Post-operative Plan: Extubation in OR  Informed Consent: I have reviewed the patients History and Physical, chart, labs and discussed the procedure including the risks, benefits and alternatives for the proposed anesthesia with the patient or authorized representative who has indicated his/her understanding and acceptance.     Plan Discussed with: CRNA, Anesthesiologist and Surgeon  Anesthesia Plan Comments:         Anesthesia Quick Evaluation

## 2017-04-25 NOTE — Transfer of Care (Signed)
Immediate Anesthesia Transfer of Care Note  Patient: Christopher Carter  Procedure(s) Performed: Procedure(s): LAPAROSCOPY ASSISTED ILEO-CECECTOMY WITH SMALL BOWEL RESECTION WITH ANASTOMOSIS (N/A)  Patient Location: PACU  Anesthesia Type:General  Level of Consciousness:  sedated, patient cooperative and responds to stimulation  Airway & Oxygen Therapy:Patient Spontanous Breathing and Patient connected to face mask oxgen  Post-op Assessment:  Report given to PACU RN and Post -op Vital signs reviewed and stable  Post vital signs:  Reviewed and stable  Last Vitals:  Vitals:   04/25/17 0939 04/25/17 1319  BP: (!) 167/84 (!) (P) 173/91  Pulse: 91 (P) 98  Resp: 16 (P) 13  Temp: 36.9 C (P) 37.2 C  SpO2: 25% (P) 18%    Complications: No apparent anesthesia complications

## 2017-04-25 NOTE — Anesthesia Procedure Notes (Signed)
Procedure Name: Intubation Date/Time: 04/25/2017 10:41 AM Performed by: West Pugh Pre-anesthesia Checklist: Patient identified, Emergency Drugs available, Suction available, Patient being monitored and Timeout performed Patient Re-evaluated:Patient Re-evaluated prior to induction Oxygen Delivery Method: Circle system utilized Preoxygenation: Pre-oxygenation with 100% oxygen Induction Type: IV induction, Rapid sequence and Cricoid Pressure applied Laryngoscope Size: Mac and 4 Grade View: Grade II Tube type: Oral Tube size: 7.5 mm Number of attempts: 1 Airway Equipment and Method: Stylet Placement Confirmation: ETT inserted through vocal cords under direct vision,  positive ETCO2,  CO2 detector and breath sounds checked- equal and bilateral Secured at: 23 cm Tube secured with: Tape Dental Injury: Teeth and Oropharynx as per pre-operative assessment  Comments: Small mouth opening. Glidescope in room. Glottic opening red and swollen. Grade 2. NGT placed to low wall suction prior to induction. Small amount of brown drainage returned.

## 2017-04-26 MED ORDER — OXYCODONE HCL 5 MG PO TABS
5.0000 mg | ORAL_TABLET | ORAL | Status: DC | PRN
  Filled 2017-04-26: qty 2

## 2017-04-26 NOTE — Progress Notes (Signed)
Patient ID: BENNETT RAM, male   DOB: 03/22/1961, 56 y.o.   MRN: [id number] 1 Day Post-Op   Subjective: Passing gas.  No n/v.  Ambulating A LOT.  Objective: Vital signs in last 24 hours: Temp:  [98.5 F (36.9 C)-99.8 F (37.7 C)] 99.1 F (37.3 C) (08/18 0516) Pulse Rate:  [86-105] 97 (08/18 0516) Resp:  [13-18] 16 (08/18 0516) BP: (141-176)/(63-91) 147/91 (08/18 0516) SpO2:  [92 %-100 %] 93 % (08/18 0516) Last BM Date: 04/23/17  Intake/Output from previous day: 08/17 0701 - 08/18 0700 In: 4315 [I.V.:4315] Out: 3400 [Urine:2900; Emesis/NG output:400; Blood:100] Intake/Output this shift: No intake/output data recorded.  General appearance: alert, cooperative and no distress GI: non distended. Incisions c/d/i.   Resp:  Breathing comfortably   Lab Results:   Recent Labs  04/25/17 0732  WBC 11.6*  HGB 14.8  HCT 42.4  PLT 211   BMET No results for input(s): NA, K, CL, CO2, GLUCOSE, BUN, CREATININE, CALCIUM in the last 72 hours.   Studies/Results: Dg Abd 2 Views  Result Date: 04/25/2017 CLINICAL DATA:  Small bowel obstruction. EXAM: ABDOMEN - 2 VIEW COMPARISON:  Radiographs dated 04/24/2018 and 04/23/2017 and CT scan dated 04/23/2017 FINDINGS: NG tube tip in the fundus of the stomach. Persistent dilated loops of small bowel in the mid abdomen. Colon is not distended. Contrast remains in multiple small bowel loops. IMPRESSION: Persistent small bowel obstruction. Electronically Signed   By: Lorriane Shire M.D.   On: 04/25/2017 08:35    Anti-infectives: Anti-infectives    Start     Dose/Rate Route Frequency Ordered Stop   04/25/17 1245  cefOXitin (MEFOXIN) 2 g in dextrose 5 % 50 mL IVPB     2 g 100 mL/hr over 30 Minutes Intravenous  Once 04/25/17 1232     04/25/17 0915  cefOXitin (MEFOXIN) 2 g in dextrose 5 % 50 mL IVPB     2 g 100 mL/hr over 30 Minutes Intravenous NOW 04/25/17 0912 04/26/17 0859      Assessment/Plan: POD 1 lap ileocecectomy and SBR for  inflamed Meckel's diverticulum.  D/C foley D/C NGT Clears now.  Fulls for dinner if no n/v.   Doing well.   Good pain control with prn morphine. Add PRN oxycodone.    LOS: 3 days    [facility] 04/26/2017

## 2017-04-26 NOTE — Progress Notes (Deleted)
Pt requested that I give him Morphine for pain. He reported that when he received oxycodone earlier today he "started seeing things" and did not want to have this happen again. He said he would discuss his pain med issue with the MD tomorrow.

## 2017-04-27 LAB — BASIC METABOLIC PANEL
ANION GAP: 6 (ref 5–15)
BUN: 13 mg/dL (ref 6–20)
CALCIUM: 8.3 mg/dL — AB (ref 8.9–10.3)
CHLORIDE: 106 mmol/L (ref 101–111)
CO2: 26 mmol/L (ref 22–32)
Creatinine, Ser: 0.97 mg/dL (ref 0.61–1.24)
GFR calc Af Amer: >60 mL/min (ref >60)
GFR calc non Af Amer: >60 mL/min (ref >60)
GLUCOSE: 101 mg/dL — AB (ref 65–99)
Potassium: 3.7 mmol/L (ref 3.5–5.1)
Sodium: 138 mmol/L (ref 135–145)

## 2017-04-27 MED ORDER — HYDROCODONE-ACETAMINOPHEN 5-325 MG PO TABS
1.0000 | ORAL_TABLET | ORAL | Status: DC | PRN
  Administered 2017-04-27: 1 via ORAL
  Administered 2017-04-27 – 2017-04-28 (×2): 2 via ORAL
  Filled 2017-04-27: qty 1
  Filled 2017-04-27: qty 2

## 2017-04-27 NOTE — Progress Notes (Signed)
Pt clarified that the last time he took oxycodone he was at home, and" started seeing things and felt strange." Because of this past experience, he does not want to take it here.

## 2017-04-27 NOTE — Anesthesia Postprocedure Evaluation (Signed)
Anesthesia Post Note  Patient: Geran A Clyatt  Procedure(s) Performed: Procedure(s) (LRB): LAPAROSCOPY ASSISTED ILEO-CECECTOMY WITH SMALL BOWEL RESECTION WITH ANASTOMOSIS (N/A)     Patient location during evaluation: PACU Anesthesia Type: General Level of consciousness: awake and alert, oriented and sedated Pain management: pain level controlled Vital Signs Assessment: post-procedure vital signs reviewed and stable Respiratory status: spontaneous breathing, nonlabored ventilation, respiratory function stable and patient connected to nasal cannula oxygen Cardiovascular status: blood pressure returned to baseline and stable Postop Assessment: no signs of nausea or vomiting Anesthetic complications: no                 Mieshia Pepitone A.

## 2017-04-27 NOTE — Progress Notes (Signed)
Patient reports eye discomfort much better. Still has some mild discomfort but continues to improve. Continue eye gtts until discharge.

## 2017-04-27 NOTE — Progress Notes (Signed)
Patient ID: Christopher Carter, male   DOB: 1961-08-15, 56 y.o.   MRN: [id number] 2 Days Post-Op   Subjective: Had quite a bit of gas pain last night after full liquids.  Continues to pass gas.  No stool yet.  No n/v.    Objective: Vital signs in last 24 hours: Temp:  [99.5 F (37.5 C)-100.2 F (37.9 C)] 99.5 F (37.5 C) (08/19 0528) Pulse Rate:  [91-98] 92 (08/19 0528) Resp:  [16-18] 16 (08/19 0528) BP: (141-155)/(82-86) 155/85 (08/19 0528) SpO2:  [92 %-94 %] 92 % (08/19 0528) Last BM Date: 04/23/17  Intake/Output from previous day: 08/18 0701 - 08/19 0700 In: 1744.2 [P.O.:360; I.V.:1384.2] Out: 0  Intake/Output this shift: No intake/output data recorded.  General appearance: alert, cooperative and no distress GI: non distended. Incisions c/d/i.  No erythema.   Resp:  Breathing comfortably   Lab Results:   Recent Labs  04/25/17 0732  WBC 11.6*  HGB 14.8  HCT 42.4  PLT 211   BMET  Recent Labs  04/27/17 0520  NA 138  K 3.7  CL 106  CO2 26  GLUCOSE 101*  BUN 13  CREATININE 0.97  CALCIUM 8.3*     Studies/Results: No results found.  Anti-infectives: Anti-infectives    Start     Dose/Rate Route Frequency Ordered Stop   04/25/17 1245  cefOXitin (MEFOXIN) 2 g in dextrose 5 % 50 mL IVPB     2 g 100 mL/hr over 30 Minutes Intravenous  Once 04/25/17 1232     04/25/17 0915  cefOXitin (MEFOXIN) 2 g in dextrose 5 % 50 mL IVPB     2 g 100 mL/hr over 30 Minutes Intravenous NOW 04/25/17 0912 04/26/17 0859      Assessment/Plan: POD 2 lap ileocecectomy and SBR for inflamed Meckel's diverticulum.  Advance diet.  Await BM Possibly home tomorrow.    LOS: 4 days    [facility] 04/27/2017

## 2017-04-28 MED ORDER — HYDROCODONE-ACETAMINOPHEN 5-325 MG PO TABS: 1.0000 | ORAL_TABLET | ORAL | 0 refills | Status: DC | PRN

## 2017-04-28 NOTE — Progress Notes (Signed)
Discharge instructions discussed with patient and family, verbalized agreement and understanding, prescription given to patient

## 2017-04-28 NOTE — Discharge Summary (Signed)
[facility]/Trauma Discharge Summary   Patient ID: Christopher Carter MRN: [id number] DOB/AGE: [birth year] 55 y.o.  Admit date: 04/23/2017 Discharge date: 04/28/2017  Admitting Diagnosis: Small bowel obstruction  Discharge Diagnosis Patient Active Problem List   Diagnosis Date Noted  . Small bowel obstruction ([place]) 04/23/2017    Consultants none  Imaging: No results found.  Procedures Dr. Excell Seltzer (04/25/17) - Laparoscopic assisted ileocecectomy and small bowel resection with anastomoses  Hospital Course:  Christopher Carter is an otherwise healthy 56 year old male with a history of HTN who presented to ED with abdominal pain.  Workup showed SBO with dilated ileum, fecalized contents and mild mesenteric edema with transition point seen at the terminal ileum. Pt was admitted. Pt was placed on the small bowel protocol. Eight and 24 hour delay films showed contrast in the small bowel had not progressed to the large colon. We decided to take the pt to the OR. Patient underwent procedure listed above on 08/17.  Tolerated procedure well and was transferred to the floor.  Diet was advanced as tolerated.  On POD#3, the patient was voiding well, tolerating diet, ambulating well, pain well controlled, vital signs stable, incisions c/d/i and felt stable for discharge home.  Patient will follow up in our office in 2 weeks and knows to call with questions or concerns.  He will call to confirm appointment date/time.    Patient was discharged in good condition.  The [place] Substance controlled database was reviewed prior to prescribing narcotic pain medication to this patient.  Physical Exam: General:  Alert, NAD, pleasant, cooperative, well appearing Cardio: RRR, S1 & S2 normal, no murmur, rubs, gallops Resp: Effort normal, lungs CTA bilaterally, no wheezes, rales, rhonchi Abd:  Soft, ND, normal bowel sounds, mild tenderness around midline incision, incisions C/D/I  Skin:  no rashes noted, warm and dry  Allergies as of 04/28/2017      Reactions   Oxycodone Hcl Other (See Comments)   Pt reported "seeing things" and " feeling unusual."      Medication List    TAKE these medications   amLODipine 10 MG tablet Commonly known as:  NORVASC Take 10 mg by mouth daily.   HAWTHORNE BERRY PO Take 1 tablet by mouth daily.   HYDROcodone-acetaminophen 5-325 MG tablet Commonly known as:  NORCO/VICODIN Take 1 tablet by mouth every 4 (four) hours as needed for moderate pain.   irbesartan 300 MG tablet Commonly known as:  AVAPRO Take 300 mg by mouth daily.   KRILL OIL PO Take 1 capsule by mouth daily.   MOVE FREE JOINT HEALTH ADVANCE PO Take 1 tablet by mouth daily.   OVER THE COUNTER MEDICATION Take 1 tablet by mouth daily.   RED YEAST RICE PO Take 2 tablets by mouth daily.        Follow-up Information    Excell Seltzer, MD. Call.   Specialty:  General Surgery Why:  we are working on an appointment for you for follow up. Please call our office to confirm your appointment  Contact information: [address] [place] [address] [phone]        [facility], [place]. Go on 05/09/2017.   Specialty:  General Surgery Why:  @ 11:00 am for staple removal, please arrive at 10:30 to complete paperwork Contact information: [address] [place] [phone]          Signed: [facility] 04/28/2017, 9:00  AM Pager: [phone] Consults: [phone]  Agree with above.  Alphonsa Overall, MD, [facility] Pager: [phone] Office phone:  [phone]

## 2017-04-28 NOTE — Discharge Instructions (Signed)
[facility], [place] [phone]  OPEN ABDOMINAL SURGERY: POST OP INSTRUCTIONS  Always review your discharge instruction sheet given to you by the facility where your surgery was performed.  IF YOU HAVE DISABILITY OR FAMILY LEAVE FORMS, YOU MUST BRING THEM TO THE OFFICE FOR PROCESSING.  PLEASE DO NOT GIVE THEM TO YOUR DOCTOR.  1. A prescription for pain medication may be given to you upon discharge.  Take your pain medication as prescribed, if needed.  If narcotic pain medicine is not needed, then you may take acetaminophen (Tylenol) or ibuprofen (Advil) as needed. 2. Take your usually prescribed medications unless otherwise directed. 3. If you need a refill on your pain medication, please contact your pharmacy. They will contact our office to request authorization.  Prescriptions will not be filled after 5pm or on week-ends. 4. You should follow a light diet the first few days after arrival home, such as soup and crackers, pudding, etc.unless your doctor has advised otherwise. A high-fiber, low fat diet can be resumed as tolerated.   Be sure to include lots of fluids daily. Most patients will experience some swelling and bruising on the chest and neck area.  Ice packs will help.  Swelling and bruising can take several days to resolve 5. Most patients will experience some swelling and bruising in the area of the incision. Ice pack will help. Swelling and bruising can take several days to resolve..  6. It is common to experience some constipation if taking pain medication after surgery.  Increasing fluid intake and taking a stool softener will usually help or prevent this problem from occurring.  A mild laxative (Milk of Magnesia or Miralax) should be taken according to package directions if there are no bowel movements after 48 hours. 7.  You may have steri-strips (small skin tapes) in place directly over the incision.  These strips should be left on the skin for 7-10 days.  If your  surgeon used skin glue on the incision, you may shower in 24 hours.  The glue will flake off over the next 2-3 weeks.  Any sutures or staples will be removed at the office during your follow-up visit. You may find that a light gauze bandage over your incision may keep your staples from being rubbed or pulled. You may shower and replace the bandage daily. You may remove your bandages on 08/21 and shower over them.  8. ACTIVITIES:  You may resume regular (light) daily activities beginning the next day--such as daily self-care, walking, climbing stairs--gradually increasing activities as tolerated.  You may have sexual intercourse when it is comfortable.  Refrain from any heavy lifting or straining until approved by your doctor. No heavy lifting (>20lbs) for 6 weeks a. You may drive when you no longer are taking prescription pain medication, you can comfortably wear a seatbelt, and you can safely maneuver your car and apply brakes b. Return to Work: 2 weeks after surgery or after your follow up appointment 9. You should see your doctor in the office for a follow-up appointment approximately two weeks after your surgery.  Make sure that you call for this appointment within a day or two after you arrive home to insure a convenient appointment time. OTHER INSTRUCTIONS:  _____________________________________________________________ _____________________________________________________________  WHEN TO CALL YOUR DOCTOR: 1. Fever over 101.0 2. Inability to urinate 3. Nausea and/or vomiting 4. Extreme swelling or bruising 5. Continued bleeding from incision. 6. Increased pain, redness, or drainage from the incision.  7. Difficulty swallowing or breathing 8. Muscle cramping or spasms. 9. Numbness or tingling in hands or feet or around lips.  The clinic staff is available to answer your questions during regular business hours.  Please dont hesitate to call and ask to speak to one of the nurses if you have  concerns.  For further questions, please visit [email or web address]

## 2017-07-08 DIAGNOSIS — H401131 Primary open-angle glaucoma, bilateral, mild stage: Secondary | ICD-10-CM | POA: Diagnosis not present

## 2017-07-30 DIAGNOSIS — K529 Noninfective gastroenteritis and colitis, unspecified: Secondary | ICD-10-CM | POA: Diagnosis not present

## 2017-07-30 DIAGNOSIS — R509 Fever, unspecified: Secondary | ICD-10-CM | POA: Diagnosis not present

## 2017-11-13 DIAGNOSIS — R112 Nausea with vomiting, unspecified: Secondary | ICD-10-CM | POA: Diagnosis not present

## 2017-11-13 DIAGNOSIS — R197 Diarrhea, unspecified: Secondary | ICD-10-CM | POA: Diagnosis not present

## 2017-11-14 DIAGNOSIS — R197 Diarrhea, unspecified: Secondary | ICD-10-CM | POA: Diagnosis not present

## 2018-04-16 DIAGNOSIS — M62830 Muscle spasm of back: Secondary | ICD-10-CM | POA: Diagnosis not present

## 2018-05-18 DIAGNOSIS — Z0000 Encounter for general adult medical examination without abnormal findings: Secondary | ICD-10-CM | POA: Diagnosis not present

## 2018-05-18 DIAGNOSIS — Z13220 Encounter for screening for lipoid disorders: Secondary | ICD-10-CM | POA: Diagnosis not present

## 2018-05-19 DIAGNOSIS — I10 Essential (primary) hypertension: Secondary | ICD-10-CM | POA: Diagnosis not present

## 2018-05-19 DIAGNOSIS — E785 Hyperlipidemia, unspecified: Secondary | ICD-10-CM | POA: Diagnosis not present

## 2018-05-19 DIAGNOSIS — Z0000 Encounter for general adult medical examination without abnormal findings: Secondary | ICD-10-CM | POA: Diagnosis not present

## 2018-07-13 DIAGNOSIS — H401131 Primary open-angle glaucoma, bilateral, mild stage: Secondary | ICD-10-CM | POA: Diagnosis not present

## 2020-12-05 ENCOUNTER — Other Ambulatory Visit: Payer: Self-pay | Admitting: Family Medicine

## 2020-12-05 DIAGNOSIS — R059 Cough, unspecified: Secondary | ICD-10-CM

## 2020-12-05 DIAGNOSIS — R9389 Abnormal findings on diagnostic imaging of other specified body structures: Secondary | ICD-10-CM

## 2020-12-07 ENCOUNTER — Ambulatory Visit
Admission: RE | Admit: 2020-12-07 | Discharge: 2020-12-07 | Disposition: A | Discharge status: Home / Self Care | Payer: 59 | Source: Ambulatory Visit | Attending: Family Medicine | Admitting: Family Medicine

## 2020-12-07 ENCOUNTER — Other Ambulatory Visit: Payer: Self-pay | Admitting: Family Medicine

## 2020-12-07 DIAGNOSIS — R9389 Abnormal findings on diagnostic imaging of other specified body structures: Secondary | ICD-10-CM

## 2020-12-07 DIAGNOSIS — R059 Cough, unspecified: Secondary | ICD-10-CM

## 2020-12-07 IMAGING — CT CT CHEST W/ CM
2 of 4 series · 15 of 36 positions shown, 18 images · IV contrast (iopamidol)
Comparison: Chest radiograph [DATE]

CLINICAL DATA: Abnormal chest radiograph, cough for 7 weeks,
coughed up blood for 1-2 weeks, history hypertension

EXAM:
CT CHEST WITH CONTRAST
TECHNIQUE: Multidetector CT imaging of the chest was performed during
intravenous contrast administration. Sagittal and coronal MPR images
reconstructed from axial data set.
CONTRAST:  75mL [B8] IOPAMIDOL ([B8]) INJECTION 61% IV

[Series 2: chest 2.00 br40 s3 · axial · 0.75mm/px · z∈[+1425,+1673]mm · 12 of 148 slices shown, 15 images (1 of 2)]
[im 12/148  mediastinal]
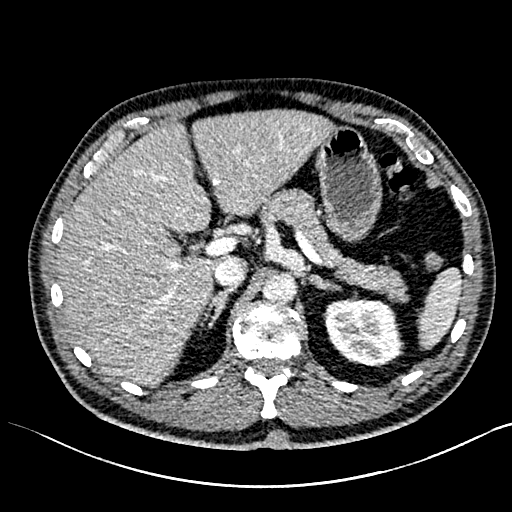
[im 12/148  lung]
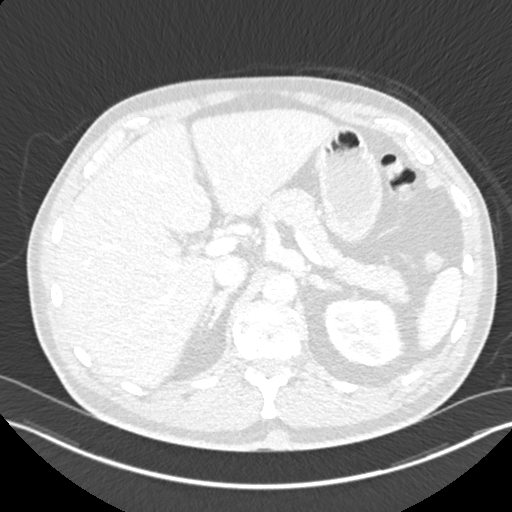
[im 23/148  lung]
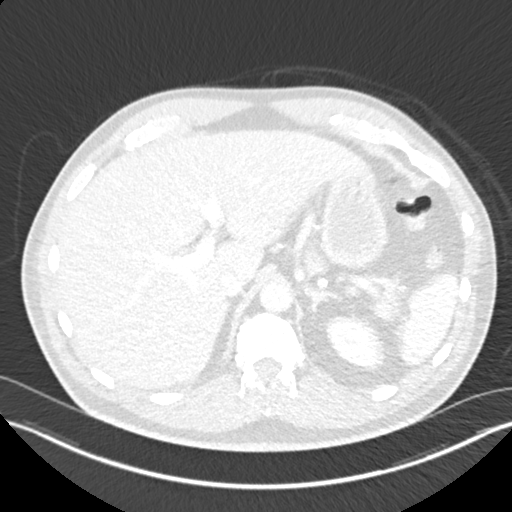
[im 34/148  lung]
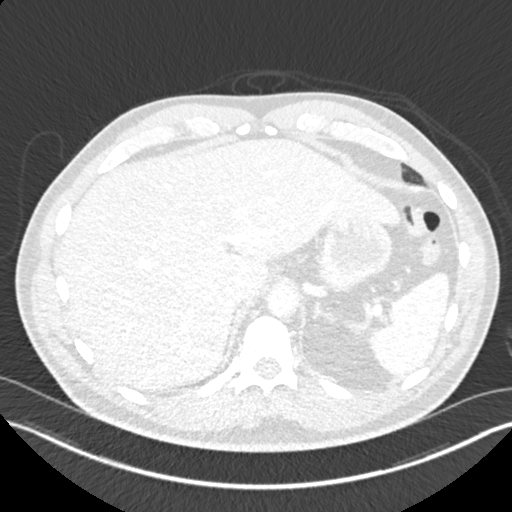
[im 46/148  lung]
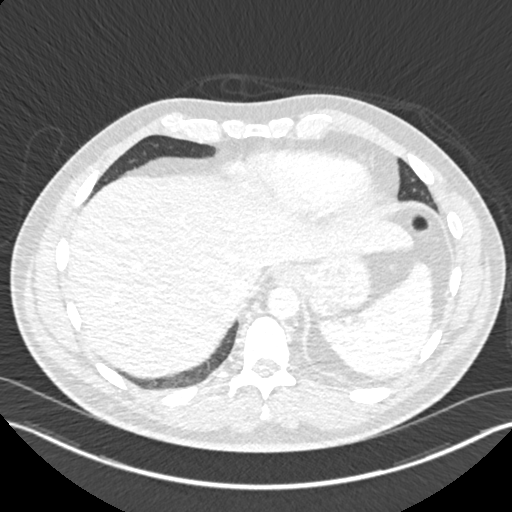
[im 57/148  mediastinal]
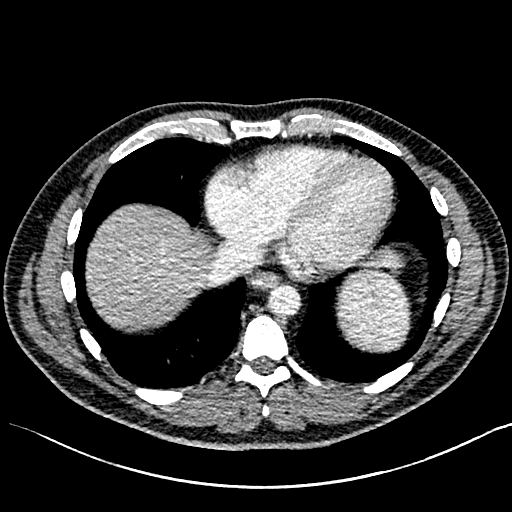
[im 57/148  lung]
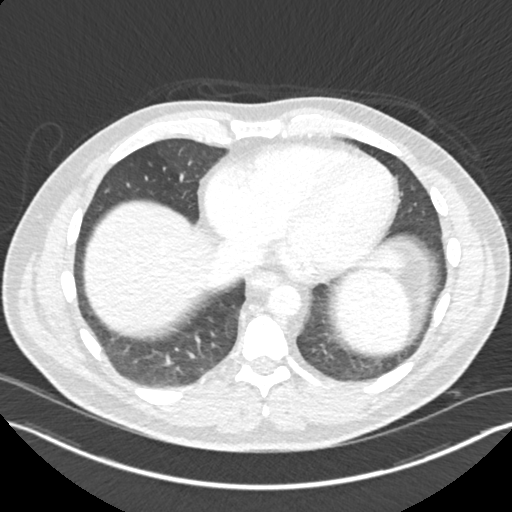
[im 68/148  lung]
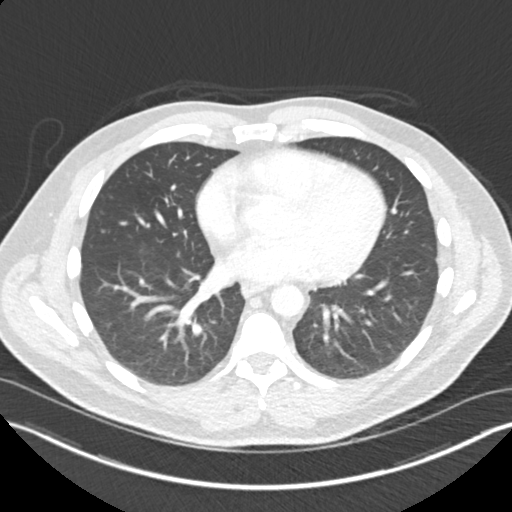
[im 80/148  lung]
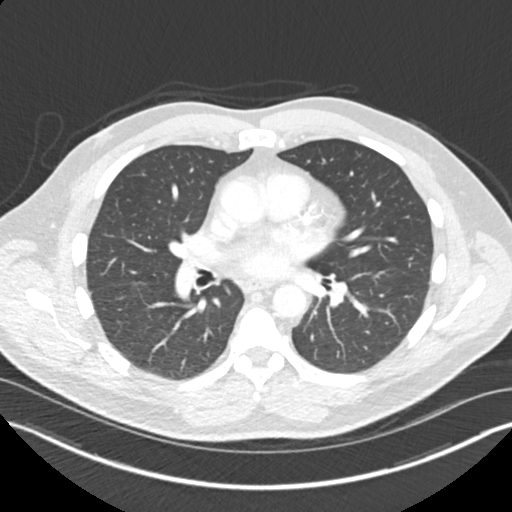
[im 91/148  lung]
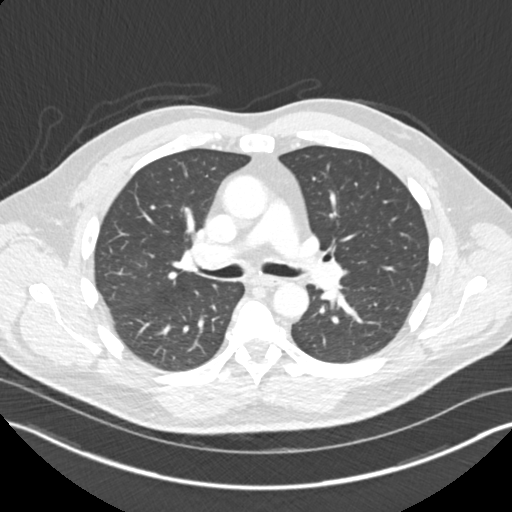
[im 102/148  mediastinal]
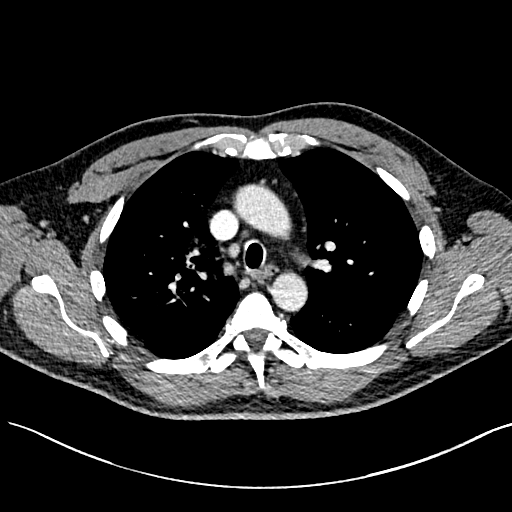
[im 102/148  lung]
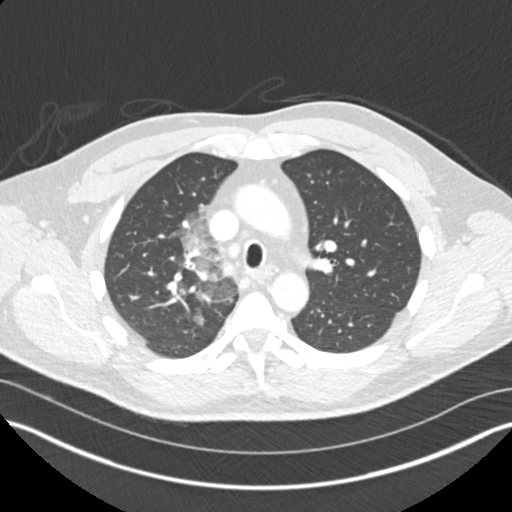
[im 114/148  lung]
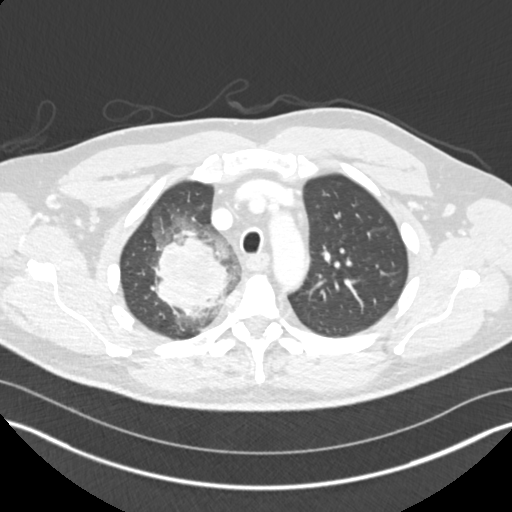
[im 125/148  lung]
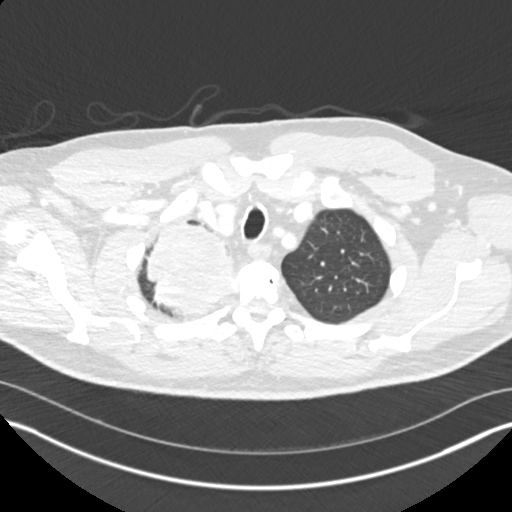
[im 136/148  lung]
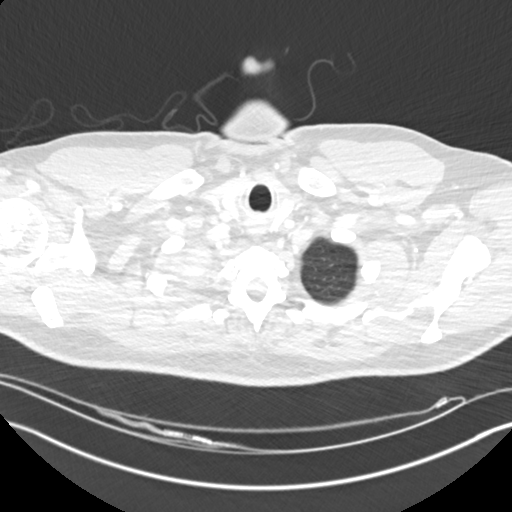

[Series 4: chest 2.00 br40 s3 · coronal · 0.58mm/px · 3 of 189 slices shown (2 of 2)]
[im 38/189  lung]
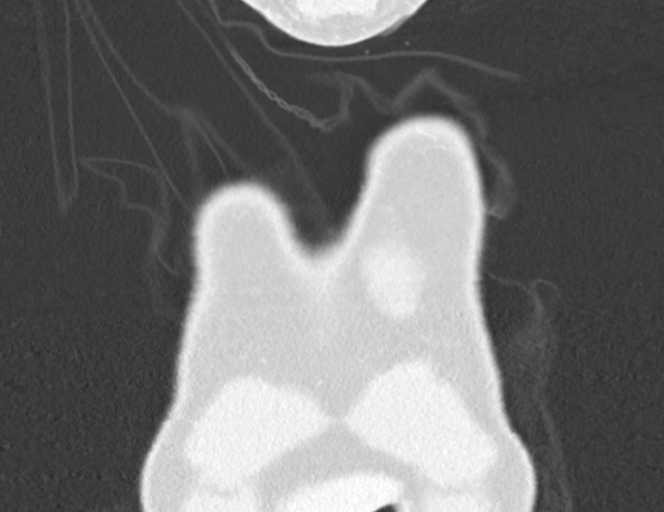
[im 76/189  lung]
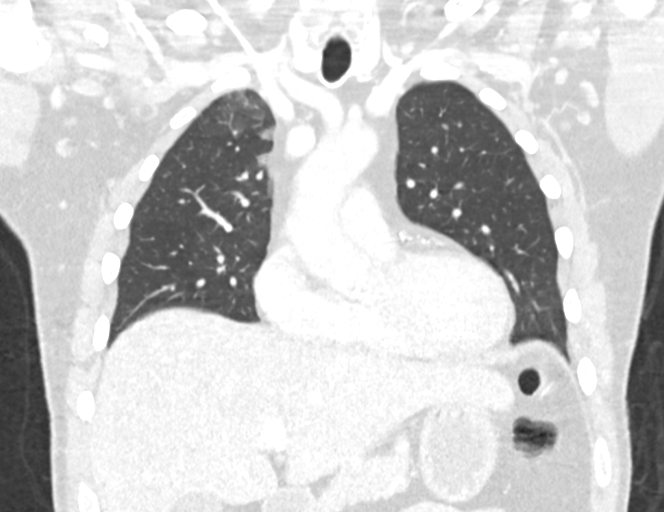
[im 113/189  lung]
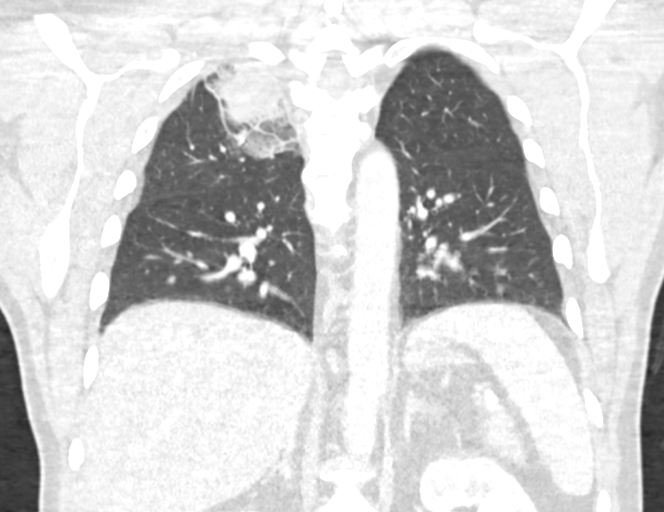

[15 of 36 positions shown; findings below may reference images not displayed]

FINDINGS: Cardiovascular: Atherosclerotic calcifications of coronary arteries.
Aorta normal caliber. Vascular structures patent. Heart
unremarkable. No pericardial effusion.

Mediastinum/Nodes: Esophagus unremarkable. Base of cervical region
normal appearance. Scattered normal size mediastinal lymph nodes,
largest 10 mm RIGHT paratracheal image 49 and 7 mm RIGHT
paratracheal image 44. Probable 9 mm RIGHT hilar node image 55.

Lungs/Pleura: Large RIGHT upper lobe mass likely representing a
pulmonary malignancy, 7.5 x 6.4 x 6.4 cm. Mass abuts the pleural
surface in portions without definite chest wall invasion or bone
erosion. Mild infiltrate adjacent to mass in RIGHT upper lobe.
Remaining lungs clear. No additional infiltrate, pleural effusion,
pneumothorax, or additional mass/nodule.

Upper Abdomen: Adrenal glands unremarkable. Remaining visualized
upper abdomen unremarkable.

Musculoskeletal: No acute osseous abnormalities.
IMPRESSION: Large RIGHT upper lobe mass 7.5 x 6.4 x 6.4 cm likely representing a
pulmonary neoplasm.

Mass abuts the pleural surface without definite chest wall invasion.

Mild surrounding infiltrate within RIGHT upper lobe.

No evidence of metastasis.

Scattered normal sized mediastinal and RIGHT hilar lymph nodes.

Coronary arterial calcification.

Aortic Atherosclerosis ([B8]-[B8]).

## 2020-12-07 MED ORDER — IOPAMIDOL (ISOVUE-300) INJECTION 61%
75.0000 mL | Freq: Once | INTRAVENOUS | Status: AC | PRN
  Administered 2020-12-07: 75 mL via INTRAVENOUS

## 2020-12-08 DIAGNOSIS — C801 Malignant (primary) neoplasm, unspecified: Secondary | ICD-10-CM

## 2020-12-08 HISTORY — DX: Malignant (primary) neoplasm, unspecified: C80.1

## 2020-12-11 ENCOUNTER — Encounter (HOSPITAL_COMMUNITY)
Admission: RE | Admit: 2020-12-11 | Discharge: 2020-12-11 | Disposition: A | Discharge status: Home / Self Care | Payer: 59 | Source: Ambulatory Visit | Attending: Family Medicine | Admitting: Family Medicine

## 2020-12-11 ENCOUNTER — Other Ambulatory Visit: Payer: Self-pay

## 2020-12-11 DIAGNOSIS — R9389 Abnormal findings on diagnostic imaging of other specified body structures: Secondary | ICD-10-CM | POA: Insufficient documentation

## 2020-12-11 MED ORDER — FLUDEOXYGLUCOSE F - 18 (FDG) INJECTION: 10.0000 | Freq: Once | INTRAVENOUS | Status: DC | PRN

## 2020-12-13 ENCOUNTER — Other Ambulatory Visit: Payer: Self-pay

## 2020-12-13 ENCOUNTER — Encounter (HOSPITAL_COMMUNITY)
Admission: RE | Admit: 2020-12-13 | Discharge: 2020-12-13 | Disposition: A | Discharge status: Home / Self Care | Payer: 59 | Source: Ambulatory Visit | Attending: Family Medicine | Admitting: Family Medicine

## 2020-12-13 DIAGNOSIS — R9389 Abnormal findings on diagnostic imaging of other specified body structures: Secondary | ICD-10-CM | POA: Insufficient documentation

## 2020-12-13 LAB — GLUCOSE, CAPILLARY: Glucose-Capillary: 137 mg/dL — ABNORMAL HIGH (ref 70–99)

## 2020-12-13 IMAGING — CT NM PET TUM IMG INITIAL (PI) SKULL BASE T - THIGH
1 of 7 series · 1 of 25 positions shown · non-contrast
Comparison: Chest CT [DATE]

CLINICAL DATA: Initial treatment strategy for right upper lobe
mass.

EXAM:
NUCLEAR MEDICINE PET SKULL BASE TO THIGH
TECHNIQUE: 9.5 mCi F-18 FDG was injected intravenously. Full-ring PET imaging
was performed from the skull base to thigh after the radiotracer. CT
data was obtained and used for attenuation correction and anatomic
localization.
Fasting blood glucose: 138 mg/dl

[Series 9: ct sk_thigh 5.0 hd_fov · axial · 5.0mm · 1.52mm/px · 1 of 225 slices shown]
[im 1/225  brain]
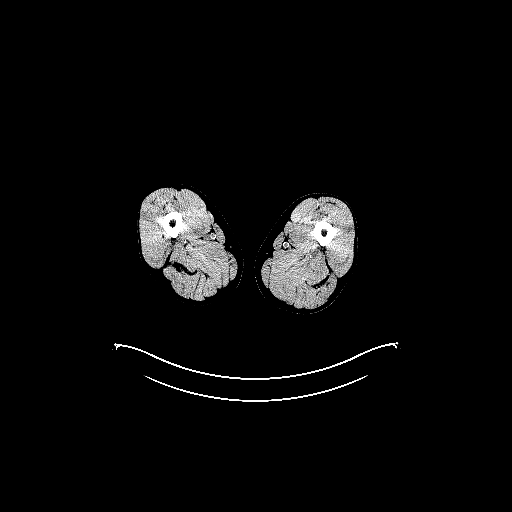

[1 of 25 positions shown; findings below may reference images not displayed]

FINDINGS: Mediastinal blood pool activity: SUV max

Liver activity: SUV max NA

NECK: Marked focal hypermetabolism is identified in the anterior
midline mouth, just posterior to the incisors with SUV max = 11.5.
No bony or soft tissue lesion evident in this region on CT imaging
although artifact from dental fillings somewhat obscures this
region.

Incidental CT findings: none

CHEST: Large right apical chest mass is markedly hypermetabolic with
SUV max = 31. Central photopenia is consistent with necrosis.

No evidence for hypermetabolic lymphadenopathy in the right hilum or
mediastinum. Upper normal right paratracheal node measuring 9 mm
short axis on image 65/9 demonstrates SUV max = 2.0.

No other findings to suggest hypermetabolic metastases in the
thorax.

Incidental CT findings: Coronary artery calcification is evident.
Atherosclerotic calcification is noted in the wall of the thoracic
aorta.

ABDOMEN/PELVIS: No abnormal hypermetabolic activity within the
liver, pancreas, adrenal glands, or spleen. No hypermetabolic lymph
nodes in the abdomen or pelvis.

Incidental CT findings: Diffuse low attenuation of liver parenchyma
compatible with fatty deposition. There is abdominal aortic
atherosclerosis without aneurysm.

SKELETON: No focal hypermetabolic activity to suggest skeletal
metastasis.

Incidental CT findings: none
IMPRESSION: 1. Markedly hypermetabolic right upper lobe pulmonary mass
consistent with primary bronchogenic neoplasm. No definite findings
of hypermetabolic metastatic disease on today's study. Somewhat
prominent upper normal sized right paratracheal node shows only low
level FDG accumulation (below blood pool).
2. Indeterminate focal hypermetabolism in the anterior mouth which
may be related to the tip of the tongue, anterior palate or anterior
floor mouth as it projects just posterior to the incisors. This
region should be amenable to clinical inspection.
3. Hepatic steatosis.
4.  Aortic Atherosclerois ([2J]-170.0)

## 2020-12-13 MED ORDER — FLUDEOXYGLUCOSE F - 18 (FDG) INJECTION
12.0000 | Freq: Once | INTRAVENOUS | Status: AC | PRN
  Administered 2020-12-13: 9.5 via INTRAVENOUS

## 2020-12-15 ENCOUNTER — Telehealth: Payer: Self-pay | Admitting: *Deleted

## 2020-12-15 DIAGNOSIS — R918 Other nonspecific abnormal finding of lung field: Secondary | ICD-10-CM

## 2020-12-15 NOTE — Telephone Encounter (Signed)
I received referral on Mr. Christopher Carter today.  I called and scheduled him to be seen next week with Dr. Julien Nordmann. He verbalized understanding.

## 2020-12-20 ENCOUNTER — Encounter: Payer: Self-pay | Admitting: *Deleted

## 2020-12-20 NOTE — Progress Notes (Signed)
I received a call from patient's wife. She updated me that patient is COVID + and can not be seen tomorrow. Dr. Julien Nordmann updated me to schedule him a video visit for tomorrow.  I updated wife and she will be able to complete video visit.  Appt scheduled.

## 2020-12-20 NOTE — Progress Notes (Signed)
I received a message that patient has been dx with COVID.  His new patient appt was set up for tomorrow but per provider is cancelled.  He is to be seen in 2 weeks with Dr. Julien Nordmann. I notified scheduling team to call and re-schedule.

## 2020-12-21 ENCOUNTER — Inpatient Hospital Stay: Payer: 59

## 2020-12-21 ENCOUNTER — Other Ambulatory Visit: Payer: Self-pay | Admitting: Medical Oncology

## 2020-12-21 ENCOUNTER — Encounter: Payer: Self-pay | Admitting: Medical Oncology

## 2020-12-21 ENCOUNTER — Inpatient Hospital Stay: Payer: 59 | Admitting: Internal Medicine

## 2020-12-21 ENCOUNTER — Other Ambulatory Visit: Payer: Self-pay | Admitting: *Deleted

## 2020-12-21 ENCOUNTER — Telehealth: Payer: Self-pay

## 2020-12-21 ENCOUNTER — Inpatient Hospital Stay: Payer: 59 | Attending: Internal Medicine | Admitting: Internal Medicine

## 2020-12-21 ENCOUNTER — Other Ambulatory Visit (HOSPITAL_COMMUNITY): Payer: Self-pay

## 2020-12-21 ENCOUNTER — Other Ambulatory Visit: Payer: Self-pay | Admitting: Emergency Medicine

## 2020-12-21 ENCOUNTER — Encounter: Payer: Self-pay | Admitting: *Deleted

## 2020-12-21 ENCOUNTER — Telehealth: Payer: Self-pay | Admitting: *Deleted

## 2020-12-21 ENCOUNTER — Other Ambulatory Visit: Payer: Self-pay | Admitting: Physician Assistant

## 2020-12-21 DIAGNOSIS — C3490 Malignant neoplasm of unspecified part of unspecified bronchus or lung: Secondary | ICD-10-CM

## 2020-12-21 DIAGNOSIS — U071 COVID-19: Secondary | ICD-10-CM

## 2020-12-21 DIAGNOSIS — R918 Other nonspecific abnormal finding of lung field: Secondary | ICD-10-CM | POA: Diagnosis not present

## 2020-12-21 DIAGNOSIS — R042 Hemoptysis: Secondary | ICD-10-CM | POA: Diagnosis not present

## 2020-12-21 MED ORDER — NIRMATRELVIR/RITONAVIR (PAXLOVID)TABLET
3.0000 | ORAL_TABLET | Freq: Two times a day (BID) | ORAL | 0 refills | Status: AC
  Filled 2020-12-21: qty 30, 5d supply, fill #0

## 2020-12-21 MED ORDER — HYDROCODONE-HOMATROPINE 5-1.5 MG/5ML PO SYRP: 5.0000 mL | ORAL_SOLUTION | Freq: Four times a day (QID) | ORAL | 0 refills | Status: DC | PRN

## 2020-12-21 NOTE — Progress Notes (Signed)
The proposed treatment discussed in cancer conference is for discussion purpose only and is not a binding recommendation. The patient was not physically examined nor present for their treatment options. Therefore, final treatment plans cannot be decided.  ?

## 2020-12-21 NOTE — Progress Notes (Signed)
Outpatient Oral COVID Treatment Note  I connected with Christopher Carter on 12/21/2020/12:54 PM by telephone and verified that I am speaking with the correct person using two identifiers.  I discussed the limitations, risks, security, and privacy concerns of performing an evaluation and management service by telephone and the availability of in person appointments. I also discussed with the patient that there may be a patient responsible charge related to this service. The patient expressed understanding and agreed to proceed.  Patient location: home Provider location: office   Diagnosis: COVID-19 infection  Purpose of visit: Discussion of potential use of Molnupiravir or Paxlovid, a new treatment for mild to moderate COVID-19 viral infection in non-hospitalized patients.   Subjective: Patient is a 60 y.o. male who has been diagnosed with COVID 19 viral infection.  Their symptoms began on 4/12 with cough and cold.    Past Medical History:  Diagnosis Date  . Hypertension     Allergies  Allergen Reactions  . Oxycodone Hcl Other (See Comments)    Pt reported "seeing things" and " feeling unusual."  . Bupropion Other (See Comments)     Current Outpatient Medications:  .  nirmatrelvir/ritonavir EUA (PAXLOVID) TABS, Take 3 tablets by mouth 2 (two) times daily for 5 days. Take nirmatrelvir (150 mg) two tablet(s) twice daily for 5 days and ritonavir (100 mg) one tablet twice daily for 5 days., Disp: 30 tablet, Rfl: 0 .  amLODipine (NORVASC) 10 MG tablet, Take 10 mg by mouth daily., Disp: , Rfl:  .  Glucos-Chond-Hyal Ac-Ca Fructo (MOVE FREE JOINT HEALTH ADVANCE PO), Take 1 tablet by mouth daily., Disp: , Rfl:  .  HAWTHORNE BERRY PO, Take 1 tablet by mouth daily., Disp: , Rfl:  .  HYDROcodone-acetaminophen (NORCO/VICODIN) 5-325 MG tablet, Take 1 tablet by mouth every 4 (four) hours as needed for moderate pain., Disp: 30 tablet, Rfl: 0 .  irbesartan (AVAPRO) 300 MG tablet, Take 300 mg by mouth  daily., Disp: , Rfl:  .  KRILL OIL PO, Take 1 capsule by mouth daily., Disp: , Rfl:  .  levofloxacin (LEVAQUIN) 750 MG tablet, Take 750 mg by mouth daily., Disp: , Rfl:  .  OVER THE COUNTER MEDICATION, Take 1 tablet by mouth daily., Disp: , Rfl:  .  QUEtiapine (SEROQUEL) 50 MG tablet, Take 50 mg by mouth at bedtime., Disp: , Rfl:  .  Red Yeast Rice Extract (RED YEAST RICE PO), Take 2 tablets by mouth daily., Disp: , Rfl:  .  sertraline (ZOLOFT) 100 MG tablet, Take 1 tablet by mouth daily., Disp: , Rfl:   Objective: Patient sounds okay.  They are in no apparent distress.  Breathing is non labored.  Mood and behavior are normal.  Laboratory Data:  Recent Results (from the past 2160 hour(s))  Glucose, capillary     Status: Abnormal   Collection Time: 12/13/20  7:59 AM  Result Value Ref Range   Glucose-Capillary 137 (H) 70 - 99 mg/dL    Comment: Glucose reference range applies only to samples taken after fasting for at least 8 hours.     Assessment: 60 y.o. male with mild/moderate COVID 19 viral infection diagnosed on 4/13 at high risk for progression to severe COVID 19.  Plan:  This patient is a 60 y.o. male that meets the following criteria for Emergency Use Authorization of: Paxlovid 1. Age >12 yr AND > 40 kg 2. SARS-COV-2 positive test 3. Symptom onset < 5 days 4. Mild-to-moderate COVID disease with high risk  for severe progression to hospitalization or death  I have spoken and communicated the following to the patient or parent/caregiver regarding: 1. Paxlovid is an unapproved drug that is authorized for use under an Emergency Use Authorization.  2. There are no adequate, approved, available products for the treatment of COVID-19 in adults who have mild-to-moderate COVID-19 and are at high risk for progressing to severe COVID-19, including hospitalization or death. 3. Other therapeutics are currently authorized. For additional information on all products authorized for treatment or  prevention of COVID-19, please see [email or web address].  4. There are benefits and risks of taking this treatment as outlined in the "Fact Sheet for Patients and Caregivers."  5. "Fact Sheet for Patients and Caregivers" was reviewed with patient. A hard copy will be provided to patient from pharmacy prior to the patient receiving treatment. 6. Patients should continue to self-isolate and use infection control measures (e.g., wear mask, isolate, social distance, avoid sharing personal items, clean and disinfect "high touch" surfaces, and frequent handwashing) according to CDC guidelines.  7. The patient or parent/caregiver has the option to accept or refuse treatment. 8. Patient medication history was reviewed for potential drug interactions:Interaction with home meds: Seroquel, asked to cut it in half while on medication 9. Patient's GFR was calculated to be >60, (Creat 0.770 on labs in [place] by Dr. Harrington Challenger on 12/04/20) and they were therefore prescribed Normal dose (GFR>60) - nirmatrelvir 150mg  tab (2 tablet) by mouth twice daily AND ritonavir 100mg  tab (1 tablet) by mouth twice daily   After reviewing above information with the patient, the patient agrees to receive Paxlovid.  Follow up instructions:    . Take prescription BID x 5 days as directed . Reach out to pharmacist for counseling on medication if desired . For concerns regarding further COVID symptoms please follow up with your PCP or urgent care . For urgent or life-threatening issues, seek care at your local emergency department  The patient was provided an opportunity to ask questions, and all were answered. The patient agreed with the plan and demonstrated an understanding of the instructions.   Script sent to [facility] and opted to pick up RX.  The patient was advised to call their PCP or seek an in-person  evaluation if the symptoms worsen or if the condition fails to improve as anticipated.   I provided 10 minutes of non face-to-face telephone visit time during this encounter, and > 50% was spent counseling as documented under my assessment & plan.  Angelena Form, PA-C 12/21/2020 /12:54 PM

## 2020-12-21 NOTE — Telephone Encounter (Signed)
Lost webex connection and called patient back, unable to reach.

## 2020-12-21 NOTE — Telephone Encounter (Signed)
Called to discuss with patient about COVID-19 symptoms and the use of one of the available treatments for those with mild to moderate Covid symptoms and at a high risk of hospitalization.  Pt appears to qualify for outpatient treatment due to co-morbid conditions and/or a member of an at-risk group in accordance with the FDA Emergency Use Authorization.    Symptom onset: Cough, fever 12/19/20 Vaccinated: Yes Booster? No Immunocompromised? Yes Qualifiers: HTN, Lung cancer - newly diagnosed.  Would like to speak with APP.  Christopher Carter

## 2020-12-21 NOTE — Progress Notes (Signed)
[place] [phone]   Fax:([phone] [place], [place], [place] [address]  I connected with@ on 12/21/20 at  2:00 PM EDT by video enabled telemedicine visit and verified that I am speaking with the correct person using two identifiers.   I discussed the limitations, risks, security and privacy concerns of performing an evaluation and management service by telemedicine and the availability of in-person appointments. I also discussed with the patient that there may be a patient responsible charge related to this service. The patient expressed understanding and agreed to proceed.  Other persons participating in the visit and their role in the encounter: His wife and his thoracic navigator.  Patient's location: Home Provider's location: [facility]   REFERRING PHYSICIAN: Dr. Melinda Crutch  REASON FOR CONSULTATION:  60 years old white male with highly suspicious lung cancer.  HPI Christopher Carter is a 60 y.o. male a never smoker with past medical history significant for hypertension and depression.  The patient mentions that he has some family gathering in February 2022.  One of his family members tested positive for COVID.  Several days later the patient started having a lot of cough and chest congestion.  He was tested for COVID twice and it was negative.  In late March 2022 he was evaluated by his primary care physician and chest x-ray was performed on December 05, 2020 and it showed large right apical opacity worrisome for a mass lesion.  He has not had CT scan of the chest on December 07, 2020 and it showed large right upper lobe mass likely representing a pulmonary malignancy and measuring 7.5 x 6.4 x 6.4 cm.  The mass abuts the pleural surface and portions without definite chest wall invasion or bone erosion.  There was mild infiltrate adjacent to the mass in the right upper lobe.  There was  a scattered normal-sized mediastinal lymph nodes the largest 1.0 cm in the right paratracheal and 0.7 cm in the right paratracheal and probable 0.9 cm right hilar node.  He has a PET scan on 12/13/2020 and it showed markedly hypermetabolic right upper lobe pulmonary mass consistent with primary bronchogenic neoplasm.  There was no definite findings of hypermetabolic metastatic disease.  There was somewhat prominent upper normal size right paratracheal node with only low-level FDG accumulation and indeterminate focal hypermetabolism in the anterior mouth which may be related to the tip of the tongue, anterior palate or anterior floor mouth as it projects just posterior to the incisors. Dr. Harrington Challenger kindly referred the patient to the multidisciplinary thoracic oncology clinic today for evaluation.  Unfortunately before the in person visit the patient had fever and he was tested positive for COVID yesterday and the visit was switched to virtual video visit. When seen today he actually has a good day and slept well last night.  He continues to have cough with occasional hemoptysis of red fresh blood but no significant shortness of breath and he has mild chest pain from the cough.  He has occasional nausea and vomiting but no significant abdominal pain, diarrhea or constipation.  He lost around 12 pounds in the last 5 weeks.  He has no headache or visual changes. Family history significant for father with heart disease and hypertension.  Mother had hypertension.  2 sisters had breast cancer in their late 65s. The patient is married and has 1 daughter.  He was accompanied today by  [name].  He works as Air traffic controller.  He has no history of smoking but drinks 2-3 beer every day and he also uses CBD oil.  No history of drug abuse.  HPI  Past Medical History:  Diagnosis Date  . Hypertension     Past Surgical History:  Procedure Laterality Date  . LAPAROSCOPY N/A 04/25/2017   Procedure: LAPAROSCOPY  ASSISTED ILEO-CECECTOMY WITH SMALL BOWEL RESECTION WITH ANASTOMOSIS;  Surgeon: Excell Seltzer, MD;  Location: [facility];  Service: General;  Laterality: N/A;    No family history on file.  Social History Social History   Tobacco Use  . Smoking status: Never Smoker  . Smokeless tobacco: Never Used  Substance Use Topics  . Alcohol use: Yes    Alcohol/week: 5.0 standard drinks    Types: 5 Cans of beer per week    Comment: daily  . Drug use: No    Allergies  Allergen Reactions  . Oxycodone Hcl Other (See Comments)    Pt reported "seeing things" and " feeling unusual."  . Bupropion Other (See Comments)    Current Outpatient Medications  Medication Sig Dispense Refill  . amLODipine (NORVASC) 10 MG tablet Take 10 mg by mouth daily.    . Glucos-Chond-Hyal Ac-Ca Fructo (MOVE FREE JOINT HEALTH ADVANCE PO) Take 1 tablet by mouth daily.    Marland Kitchen HAWTHORNE BERRY PO Take 1 tablet by mouth daily.    Marland Kitchen HYDROcodone-acetaminophen (NORCO/VICODIN) 5-325 MG tablet Take 1 tablet by mouth every 4 (four) hours as needed for moderate pain. 30 tablet 0  . irbesartan (AVAPRO) 300 MG tablet Take 300 mg by mouth daily.    Marland Kitchen KRILL OIL PO Take 1 capsule by mouth daily.    Marland Kitchen levofloxacin (LEVAQUIN) 750 MG tablet Take 750 mg by mouth daily.    . nirmatrelvir/ritonavir EUA (PAXLOVID) TABS Take 3 tablets by mouth 2 (two) times daily for 5 days. Take nirmatrelvir (150 mg) two tablet(s) twice daily for 5 days and ritonavir (100 mg) one tablet twice daily for 5 days. 30 tablet 0  . OVER THE COUNTER MEDICATION Take 1 tablet by mouth daily.    . QUEtiapine (SEROQUEL) 50 MG tablet Take 50 mg by mouth at bedtime.    . Red Yeast Rice Extract (RED YEAST RICE PO) Take 2 tablets by mouth daily.    . sertraline (ZOLOFT) 100 MG tablet Take 1 tablet by mouth daily.     No current facility-administered medications for this visit.    Review of Systems  Constitutional: positive for fatigue and weight loss Eyes:  negative Ears, nose, mouth, throat, and face: negative Respiratory: positive for cough, dyspnea on exertion, hemoptysis and pleurisy/chest pain Cardiovascular: negative Gastrointestinal: positive for nausea and vomiting Genitourinary:negative Integument/breast: negative Hematologic/lymphatic: negative Musculoskeletal:negative Neurological: negative Behavioral/Psych: negative Endocrine: negative Allergic/Immunologic: negative   PERFORMANCE STATUS: ECOG 1  LABORATORY DATA: Lab Results  Component Value Date   WBC 11.6 (H) 04/25/2017   HGB 14.8 04/25/2017   HCT 42.4 04/25/2017   MCV 97.5 04/25/2017   PLT 211 04/25/2017      Chemistry      Component Value Date/Time   NA 138 04/27/2017 0520   K 3.7 04/27/2017 0520   CL 106 04/27/2017 0520   CO2 26 04/27/2017 0520   BUN 13 04/27/2017 0520   CREATININE 0.97 04/27/2017 0520      Component Value Date/Time   CALCIUM 8.3 (L) 04/27/2017 0520   ALKPHOS 52 04/23/2017 0921   AST 35 04/23/2017  0921   ALT 38 04/23/2017 0921   BILITOT 1.2 04/23/2017 4665       RADIOGRAPHIC STUDIES: CT CHEST W CONTRAST  Result Date: 12/07/2020 CLINICAL DATA:  Abnormal chest radiograph, cough for 7 weeks, coughed up blood for 1-2 weeks, history hypertension EXAM: CT CHEST WITH CONTRAST TECHNIQUE: Multidetector CT imaging of the chest was performed during intravenous contrast administration. Sagittal and coronal MPR images reconstructed from axial data set. CONTRAST:  43mL ISOVUE-300 IOPAMIDOL (ISOVUE-300) INJECTION 61% IV COMPARISON:  Chest radiograph 12/04/2020 FINDINGS: Cardiovascular: Atherosclerotic calcifications of coronary arteries. Aorta normal caliber. Vascular structures patent. Heart unremarkable. No pericardial effusion. Mediastinum/Nodes: Esophagus unremarkable. Base of cervical region normal appearance. Scattered normal size mediastinal lymph nodes, largest 10 mm RIGHT paratracheal image 49 and 7 mm RIGHT paratracheal image 44. Probable 9  mm RIGHT hilar node image 55. Lungs/Pleura: Large RIGHT upper lobe mass likely representing a pulmonary malignancy, 7.5 x 6.4 x 6.4 cm. Mass abuts the pleural surface in portions without definite chest wall invasion or bone erosion. Mild infiltrate adjacent to mass in RIGHT upper lobe. Remaining lungs clear. No additional infiltrate, pleural effusion, pneumothorax, or additional mass/nodule. Upper Abdomen: Adrenal glands unremarkable. Remaining visualized upper abdomen unremarkable. Musculoskeletal: No acute osseous abnormalities. IMPRESSION: Large RIGHT upper lobe mass 7.5 x 6.4 x 6.4 cm likely representing a pulmonary neoplasm. Mass abuts the pleural surface without definite chest wall invasion. Mild surrounding infiltrate within RIGHT upper lobe. No evidence of metastasis. Scattered normal sized mediastinal and RIGHT hilar lymph nodes. Coronary arterial calcification. Aortic Atherosclerosis (ICD10-I70.0). Electronically Signed   By: Lavonia Dana M.D.   On: 12/07/2020 13:18   NM PET Image Initial (PI) Skull Base To Thigh  Result Date: 12/14/2020 CLINICAL DATA:  Initial treatment strategy for right upper lobe mass. EXAM: NUCLEAR MEDICINE PET SKULL BASE TO THIGH TECHNIQUE: 9.5 mCi F-18 FDG was injected intravenously. Full-ring PET imaging was performed from the skull base to thigh after the radiotracer. CT data was obtained and used for attenuation correction and anatomic localization. Fasting blood glucose: 138 mg/dl COMPARISON:  Chest CT 12/07/2020 FINDINGS: Mediastinal blood pool activity: SUV max 2.3 Liver activity: SUV max NA NECK: Marked focal hypermetabolism is identified in the anterior midline mouth, just posterior to the incisors with SUV max = 11.5. No bony or soft tissue lesion evident in this region on CT imaging although artifact from dental fillings somewhat obscures this region. Incidental CT findings: none CHEST: Large right apical chest mass is markedly hypermetabolic with SUV max = 31. Central  photopenia is consistent with necrosis. No evidence for hypermetabolic lymphadenopathy in the right hilum or mediastinum. Upper normal right paratracheal node measuring 9 mm short axis on image 65/9 demonstrates SUV max = 2.0. No other findings to suggest hypermetabolic metastases in the thorax. Incidental CT findings: Coronary artery calcification is evident. Atherosclerotic calcification is noted in the wall of the thoracic aorta. ABDOMEN/PELVIS: No abnormal hypermetabolic activity within the liver, pancreas, adrenal glands, or spleen. No hypermetabolic lymph nodes in the abdomen or pelvis. Incidental CT findings: Diffuse low attenuation of liver parenchyma compatible with fatty deposition. There is abdominal aortic atherosclerosis without aneurysm. SKELETON: No focal hypermetabolic activity to suggest skeletal metastasis. Incidental CT findings: none IMPRESSION: 1. Markedly hypermetabolic right upper lobe pulmonary mass consistent with primary bronchogenic neoplasm. No definite findings of hypermetabolic metastatic disease on today's study. Somewhat prominent upper normal sized right paratracheal node shows only low level FDG accumulation (below blood pool). 2. Indeterminate focal hypermetabolism in the anterior  mouth which may be related to the tip of the tongue, anterior palate or anterior floor mouth as it projects just posterior to the incisors. This region should be amenable to clinical inspection. 3. Hepatic steatosis. 4.  Aortic Atherosclerois (ICD10-170.0) Electronically Signed   By: Misty Stanley M.D.   On: 12/14/2020 09:04    ASSESSMENT: This is a very pleasant 60 years old white male recently diagnosed with at least stage IIIa (T4, N0, M0) likely lung cancer pending tissue diagnosis and further staging work-up.  He presented with large right upper lobe lung mass.   PLAN: I had a lengthy discussion with the patient and his wife today about his current condition and further investigation to confirm  his diagnosis as well as possible treatment options. I personally and independently reviewed the scan images and discussed the result and showed the images to the patient and his wife. I recommended for the patient to complete the staging work-up by ordering MRI of the brain to rule out brain metastasis. I will refer the patient to Dr. Lamonte Sakai with pulmonary medicine for consideration of bronchoscopy with endobronchial ultrasound and biopsy for confirmation of the tissue diagnosis and also to check his pulmonary function test for consideration of surgical resection if appropriate. The patient will also see Dr. Roxan Hockey after his biopsy and pulmonary function test for discussion of surgical resection in less than 2 weeks. I will arrange for the patient a follow-up appointment with me in around 2 weeks.  We will discuss at that time the possible surgical resection versus consideration of neoadjuvant chemotherapy in combination with immunotherapy versus neoadjuvant concurrent chemoradiation followed by surgery. The patient and his wife agreed to the current plan. For the dry cough, I sent him prescription for Hycodan to be used on as-needed basis. The patient was advised to call immediately if he has any other concerning symptoms in the interval.  The patient voices understanding of current disease status and treatment options and is in agreement with the current care plan.  All questions were answered. The patient knows to call the clinic with any problems, questions or concerns. We can certainly see the patient much sooner if necessary.  Thank you so much for allowing me to participate in the care of Christopher Carter. I will continue to follow up the patient with you and assist in his care.  I provided 60 minutes of face-to-face video visit time during this encounter, and > 50% was spent counseling as documented under my assessment & plan.  Eilleen Kempf, MD 12/21/2020 1:53 PM  Disclaimer:  This note was dictated with voice recognition software. Similar sounding words can inadvertently be transcribed and may not be corrected upon review.

## 2020-12-21 NOTE — Progress Notes (Signed)
I spoke with the patient and his wife by phone. Explained the rationale for FOB + EBUS. They understand. I will send a request to our office to get this scheduled, goal first case 4/26 - 4/29.

## 2020-12-21 NOTE — Patient Instructions (Signed)
Lung Cancer Lung cancer is an abnormal growth of cancerous cells that forms a mass (malignant tumor) in a lung. There are several types of lung cancer. The types are based on the appearance of the tumor cells. The two most common types are:  Non-small cell lung cancer. This type of lung cancer is the most common type. Non-small cell lung cancers include squamous cell carcinoma, adenocarcinoma, and large cell carcinoma.  Small cell lung cancer. In this type of lung cancer, abnormal cells are smaller than those of non-small cell lung cancer. Small cell lung cancer gets worse (progresses) faster than non-small cell lung cancer. What are the causes? The most common cause of lung cancer is smoking tobacco. The second most common cause is exposure to a chemical called radon. What increases the risk? You are more likely to develop this condition if:  You smoke tobacco.  You have been exposed to: ? Secondhand tobacco smoke. ? Radon gas. ? Uranium. ? Asbestos. ? Arsenic in drinking water. ? Air pollution.  You have a family or personal history of lung cancer.  You have had lung radiation therapy in the past.  You are older than age 45. What are the signs or symptoms? In the early stages, you may not have any symptoms. As the cancer progresses, symptoms may include:  A lasting cough, possibly with blood.  Fatigue.  Unexplained weight loss.  Shortness of breath.  Loud breathing (wheezing).  Chest pain.  Loss of appetite. Symptoms of advanced lung cancer include:  Hoarseness.  Bone or joint pain.  Weakness.  Change in the structure of the fingernails (clubbing), so that the nail looks like an upside-down spoon.  Swelling of the face or arms.  Inability to move the face (paralysis).  Drooping eyelids. How is this diagnosed? This condition may be diagnosed based on:  Your symptoms and medical history.  A physical exam.  A chest X-ray.  A CT scan.  Blood  tests.  Sputum tests.  Removal of a sample of lung tissue (lung biopsy) for testing. Your cancer will be assessed (staged) to determine how severe it is and how much it has spread (metastasized). How is this treated? Treatment depends on the type and stage of your cancer. Treatment may include one or more of the following:  Surgery to remove as much of the cancer as possible. Lymph nodes in the area may be removed and tested for cancer as well.  Medicines that kill cancer cells (chemotherapy).  High-energy rays that kill cancer cells (radiation therapy).  Targeted therapy. This targets specific parts of cancer cells and the area around them to block the growth and spread of the cancer. Targeted therapy can help limit the damage to healthy cells. Follow these instructions at home: Eating and drinking  Some of your treatments might affect your appetite. If you are having problems eating, or if you do not have an appetite, meet with a dietitian.  If you have side effects that affect your appetite, it may help to: ? Eat smaller meals and snacks often. ? Drink high-nutrition and high-calorie shakes or supplements. ? Eat bland and soft foods that are easy to eat. ? Avoid eating foods that are hot, spicy, or hard to swallow. General instructions  Do not use any products that contain nicotine or tobacco, such as cigarettes and e-cigarettes. If you need help quitting, ask your health care provider.  Do not drink alcohol.  If you are admitted to the hospital, make sure your  cancer specialist (oncologist) is aware. Your cancer may affect your treatment for other conditions.  Take over-the-counter and prescription medicines only as told by your health care provider.  Consider joining a support group for people who have been diagnosed with lung cancer.  Work with your health care provider to manage any side effects of treatment.  Keep all follow-up visits as told by your health care  provider. This is important.   Where to find more information  American Cancer Society: https://www.cancer.[place] https://www.cancer.gov Contact a health care provider if you:  Lose weight without trying.  Have a persistent cough and wheezing.  Feel short of breath.  Get tired easily.  Have bone or joint pain.  Have difficulty swallowing.  Notice that your voice is changing or getting hoarse.  Have pain that does not get better with medicine. Get help right away if you:  Cough up blood.  Have new breathing problems.  Have chest pain.  Have a fever.  Have swelling in an ankle, leg, or arm, or the face or neck.  Have paralysis in your face.  Are very confused.  Have a drooping eyelid. Summary  Lung cancer is an abnormal growth of cancerous cells that forms a mass (malignant tumor) in a lung.  There are several types of lung cancer. The types are based on the appearance of the tumor cells. The two most common types are non-small cell and small cell.  The most common cause of lung cancer is smoking tobacco.  Early symptoms include a lasting cough, possibly with blood, and fatigue, unexplained weight loss, and shortness of breath.  After diagnosis, treatment depends on the type and stage of your cancer. This information is not intended to replace advice given to you by your health care provider. Make sure you discuss any questions you have with your health care provider. Document Revised: 04/27/2020 Document Reviewed: 04/27/2020 Elsevier Patient Education  2021 [employer].

## 2020-12-22 ENCOUNTER — Encounter: Payer: Self-pay | Admitting: *Deleted

## 2020-12-22 DIAGNOSIS — R918 Other nonspecific abnormal finding of lung field: Secondary | ICD-10-CM | POA: Insufficient documentation

## 2020-12-22 DIAGNOSIS — R042 Hemoptysis: Secondary | ICD-10-CM | POA: Insufficient documentation

## 2020-12-22 NOTE — Progress Notes (Signed)
I reached out to [name] coordinator regarding Mr. Christopher Carter brain authorization with his insurance.

## 2020-12-25 ENCOUNTER — Telehealth: Payer: Self-pay | Admitting: *Deleted

## 2020-12-25 ENCOUNTER — Telehealth: Payer: Self-pay | Admitting: Emergency Medicine

## 2020-12-25 NOTE — Telephone Encounter (Signed)
I received a message that Christopher Carter's mri brain has been [place] with insurance. I called central scheduling and was given an appt. I called Christopher Carter and update him on appt. He verbalized understanding.

## 2020-12-25 NOTE — Telephone Encounter (Signed)
Called and spoke to pt. Informed at [facility] scheduled for 01/05/21 at 7:30am; 5:30am arrival time. NPO After midnight.   CT from 12/07/20 Super D cut being sent to [place] per [facility].   No covid test sched b/c pt tested positive 12/19/20. Advised that he would need to remain asymptomatic over 10 days from dx in order to get the bronch per Dr. Ileene Musa.  Pt states he was unable to get labs done that were ordered by Dr. Julien Nordmann b/c of covid diagnosis. Please advise.

## 2020-12-26 ENCOUNTER — Encounter: Payer: Self-pay | Admitting: *Deleted

## 2020-12-26 NOTE — Progress Notes (Signed)
I followed up on Christopher Carter schedule and did not see his appt with pulmonary team.  I reached out to Dr. Lamonte Sakai for an update.

## 2020-12-27 ENCOUNTER — Other Ambulatory Visit: Payer: Self-pay | Admitting: *Deleted

## 2020-12-27 NOTE — Progress Notes (Addendum)
Please schedule the following:   Diagnosis: lung mass Which side if for nodule / mass? right Procedure: navigational bronchoscopy  Has patient been spoken to by Provider and given informed consent? yes Anesthesia: general Do you need Fluro? yes Time needed for procedure: 75 mins Date: 04/27 Alternate Date: 04/28  Time: AM/ PM preferred  Location: endo Does patient have OSA? no DM? no Or Latex allergy? no Medication Restriction: none Anticoagulate/Antiplatelet: no Pre-op Labs Ordered: per anesthesia Imaging request: super d   (If, SuperDimension CT Chest, please have STAT courier send to [facility])  Please coordinate Pre-op COVID Testing    Addendum  I have called the patient . We discussed that Dr. Valeta Harms is planning ENB either 4/27 or 4/28. They will be called with the time and place. I answered all questions. Both patient and his wife verbalized understanding. Orders signed.

## 2020-12-29 ENCOUNTER — Ambulatory Visit (HOSPITAL_COMMUNITY)
Admission: RE | Admit: 2020-12-29 | Discharge: 2020-12-29 | Disposition: A | Discharge status: Home / Self Care | Payer: 59 | Source: Ambulatory Visit | Attending: Internal Medicine | Admitting: Internal Medicine

## 2020-12-29 ENCOUNTER — Other Ambulatory Visit: Payer: Self-pay

## 2020-12-29 DIAGNOSIS — C3490 Malignant neoplasm of unspecified part of unspecified bronchus or lung: Secondary | ICD-10-CM | POA: Insufficient documentation

## 2020-12-29 IMAGING — MR MR HEAD WO/W CM
14 series · 48 of 48 positions shown · IV contrast (gadavist)
Comparison: None.

CLINICAL DATA: Non-small cell lung cancer staging

EXAM:
MRI HEAD WITHOUT AND WITH CONTRAST
TECHNIQUE: Multiplanar, multiecho pulse sequences of the brain and surrounding
structures were obtained without and with intravenous contrast.
CONTRAST:  10mL GADAVIST GADOBUTROL 1 MMOL/ML IV SOLN

[Series 5: DWI · axial · 3.0mm · 1.36mm/px · z∈[-102,+51]mm · 6 of 104 slices shown (1 of 2)]
[im 1/104]
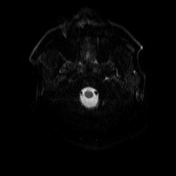
[im 21/104]
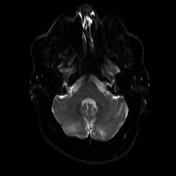
[im 42/104]
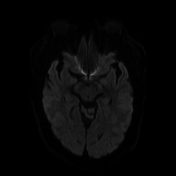
[im 62/104]
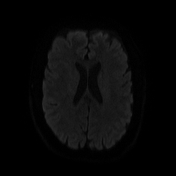
[im 83/104]
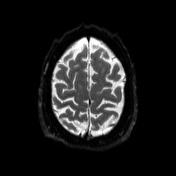
[im 104/104]
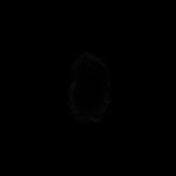

[Series 6: DWI · axial · 3.0mm · 1.36mm/px · z∈[-102,+51]mm · 3 of 52 slices shown (2 of 2)]
[im 1/52]
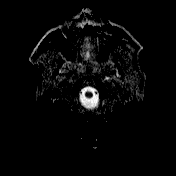
[im 26/52]
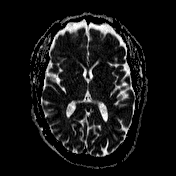
[im 52/52]
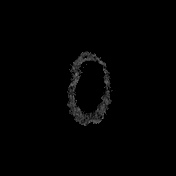

[Series 7: T1 · sagittal · 5.0mm · 0.75mm/px · 1 of 24 slices shown (1 of 2)]
[im 1/24]
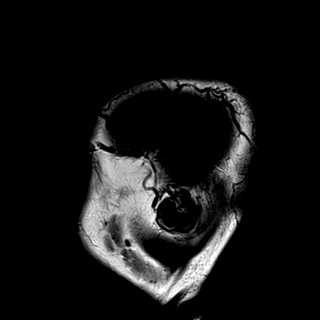

[Series 8: T2 · axial · 5.0mm · 0.62mm/px · 1 of 26 slices shown]
[im 1/26]
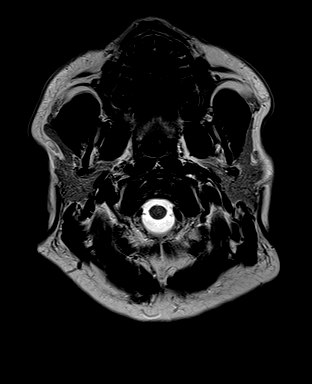

[Series 9: mip_images(sw) · axial · 24.0mm · 0.75mm/px · z∈[-102,+54]mm · 3 of 53 slices shown]
[im 1/53]
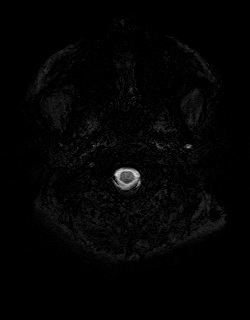
[im 27/53]
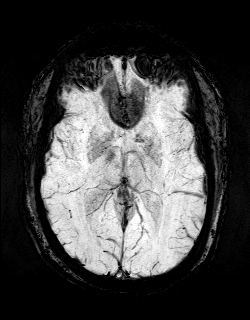
[im 53/53]
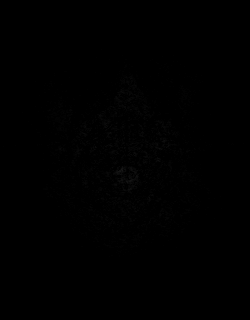

[Series 10: swi_images · axial · 3.0mm · 0.75mm/px · z∈[-113,+64]mm · 3 of 60 slices shown]
[im 1/60]
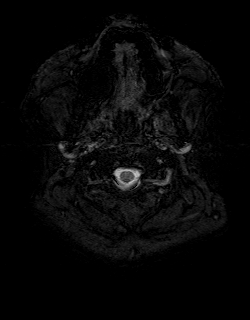
[im 30/60]
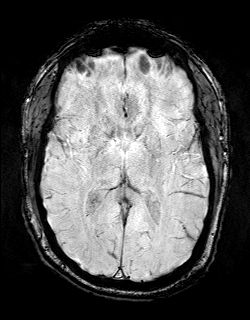
[im 60/60]
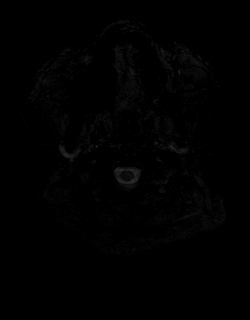

[Series 11: FLAIR · axial · 3.0mm · 0.75mm/px · z∈[-101,+52]mm · 3 of 52 slices shown]
[im 1/52]
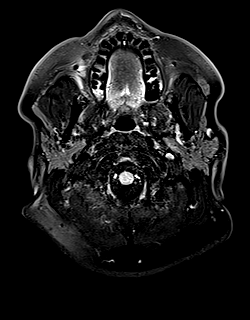
[im 26/52]
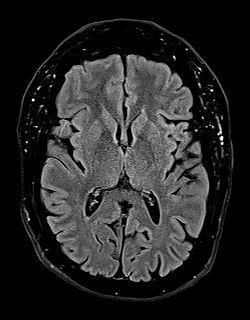
[im 52/52]
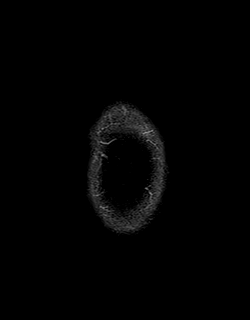

[Series 12: T1 · axial · 1.0mm · 0.94mm/px · z∈[-104,+55]mm · 9 of 160 slices shown (2 of 2)]
[im 1/160]
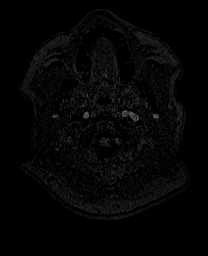
[im 20/160]
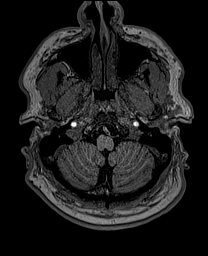
[im 40/160]
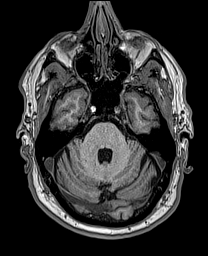
[im 60/160]
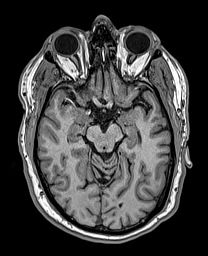
[im 80/160]
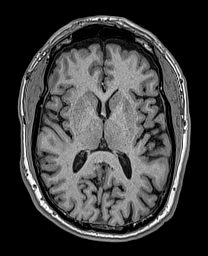
[im 100/160]
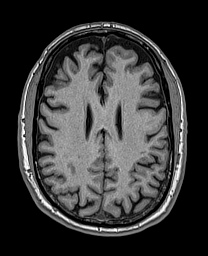
[im 120/160]
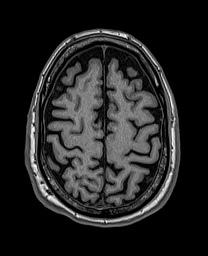
[im 140/160]
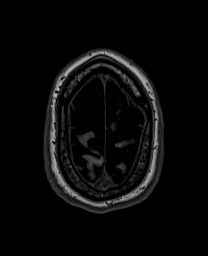
[im 160/160]
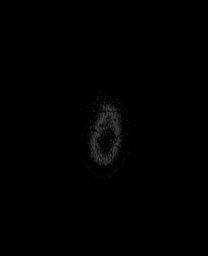

[Series 13: cor dwi_tracew · coronal · 5.0mm · 1.53mm/px · 3 of 56 slices shown]
[im 1/56]
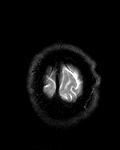
[im 28/56]
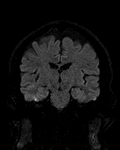
[im 56/56]
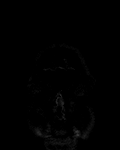

[Series 14: cor dwi_adc · coronal · 5.0mm · 1.53mm/px · 2 of 28 slices shown]
[im 1/28]
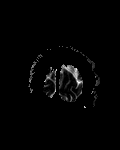
[im 28/28]
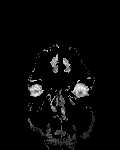

[Series 15: T2 post-contrast · coronal · 5.0mm · 0.60mm/px · 2 of 30 slices shown]
[im 1/30]
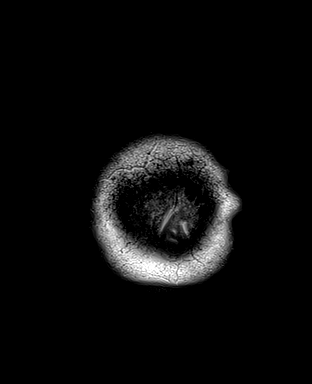
[im 30/30]
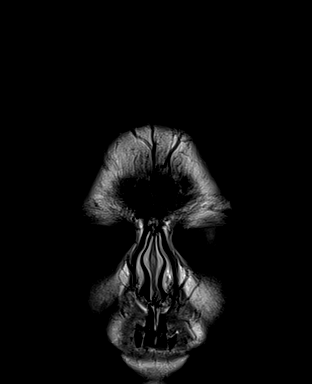

[Series 16: T1 post-contrast · axial · 1.0mm · 0.94mm/px · z∈[-104,+55]mm · 9 of 160 slices shown (1 of 3)]
[im 1/160]
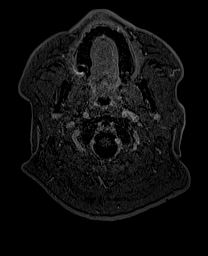
[im 20/160]
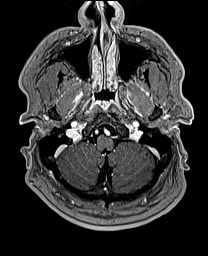
[im 40/160]
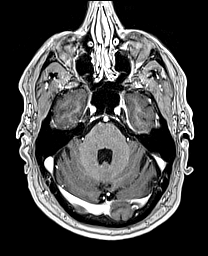
[im 60/160]
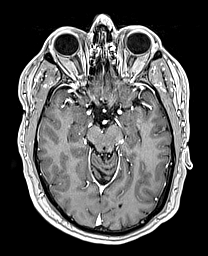
[im 80/160]
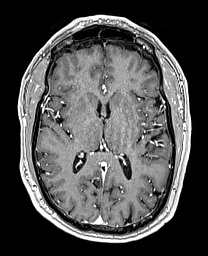
[im 100/160]
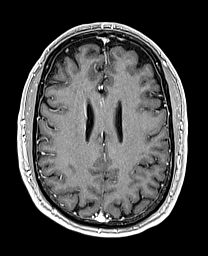
[im 120/160]
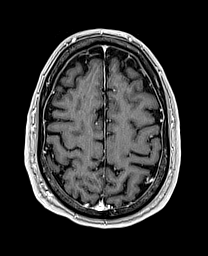
[im 140/160]
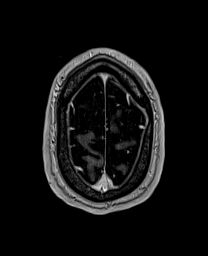
[im 160/160]
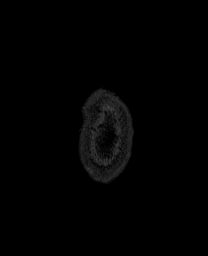

[Series 17: T1 post-contrast · coronal · 5.0mm · 0.43mm/px · 2 of 30 slices shown (2 of 3)]
[im 1/30]
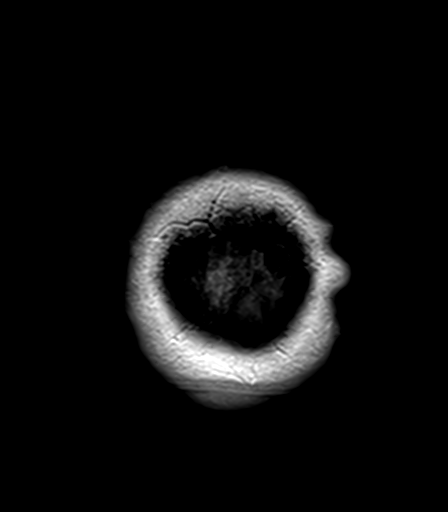
[im 30/30]
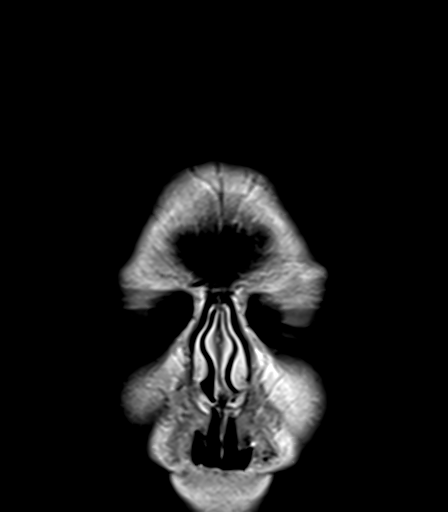

[Series 18: T1 post-contrast · sagittal · 5.0mm · 0.75mm/px · 1 of 24 slices shown (3 of 3)]
[im 1/24]
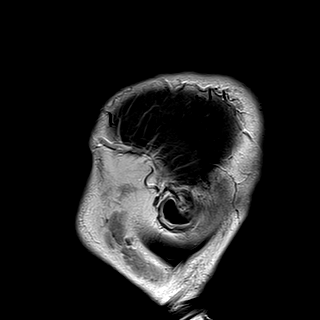

[48 of 48 positions shown; findings below may reference images not displayed]

FINDINGS: Brain: No swelling or enhancement to suggest metastatic disease. No
gray matter infarction, hemorrhage, hydrocephalus, extra-axial
collection or mass lesion. Normal brain volume. No white matter
disease.

Vascular: Normal flow voids.

Skull and upper cervical spine: No detected bony metastasis.

Sinuses/Orbits: Negative
IMPRESSION: Negative for metastatic disease to the brain.

## 2020-12-29 MED ORDER — GADOBUTROL 1 MMOL/ML IV SOLN
10.0000 mL | Freq: Once | INTRAVENOUS | Status: AC | PRN
  Administered 2020-12-29: 10 mL via INTRAVENOUS

## 2020-12-29 NOTE — Telephone Encounter (Signed)
Disc has been received of pt's Super D CT. I have placed this on RB's desk in office. Sending this to Dr. Lamonte Sakai as an Juluis Rainier.

## 2021-01-01 ENCOUNTER — Telehealth: Payer: Self-pay | Admitting: *Deleted

## 2021-01-01 NOTE — Telephone Encounter (Signed)
I didn't speak to this pt about moving their procedure up.  Was this something Dr. Lamonte Sakai wanted [place] to do? Please advise.

## 2021-01-01 NOTE — Telephone Encounter (Signed)
Dr. Lamonte Sakai is in office this afternoon will route to his nurse Estill Bamberg to follow up on today

## 2021-01-01 NOTE — Telephone Encounter (Signed)
Spoke with Christopher Carter and notified that him that bronch would not be able to be done earlier. Reminded Christopher Carter to hold ASA 81mg  2 days prior to procedure and to be NPO starting midnight the night before the procedure. Christopher Carter stated understanding. Christopher Carter always made aware someone from office will call him and review all instructions with Christopher Carter prior to procedure date. Nothing further needed at this time.

## 2021-01-01 NOTE — Telephone Encounter (Signed)
I reached out to Dr. Julien Nordmann regarding Christopher Carter schedule. He is to come here to see Dr. Julien Nordmann but bx has not been completed and Dr. Julien Nordmann would like to see him after bx. I called and re-scheduled Christopher Carter appt. He verbalized understanding of appt change and why.

## 2021-01-01 NOTE — Telephone Encounter (Signed)
Christopher Carter were you trying to get this pt rescheduled for his bronch for a sooner day?

## 2021-01-01 NOTE — Telephone Encounter (Signed)
Needs to hold ASA in prep for the FOB. Thanks,.

## 2021-01-01 NOTE — Telephone Encounter (Signed)
I don't think I can perform any sooner than 4/29 am.

## 2021-01-02 ENCOUNTER — Inpatient Hospital Stay: Payer: 59 | Admitting: Internal Medicine

## 2021-01-02 ENCOUNTER — Inpatient Hospital Stay: Payer: 59

## 2021-01-04 ENCOUNTER — Other Ambulatory Visit: Payer: Self-pay

## 2021-01-04 ENCOUNTER — Encounter (HOSPITAL_COMMUNITY): Payer: Self-pay | Admitting: Emergency Medicine

## 2021-01-04 NOTE — Progress Notes (Addendum)
PCP - Dr. Harrington Challenger Cardiologist -   Chest x-ray -  EKG - DOS Stress Test -  ECHO -  Cardiac Cath -   Blood Thinner Instructions:  Aspirin Instructions: per pt and instruction given by MD - ASA last dose 01/02/21  COVID TEST- per pt he tested + 12/19/20 - office is supposed to fax over results - please see note by Lanell Matar  Anesthesia review: n/a  -------------  SDW INSTRUCTIONS:  Your procedure is scheduled on 01/05/21. Please report to [facility] Main Entrance "A" at 0530 A.M., and check in at the Admitting office. Call this number if you have problems the morning of surgery: [phone]   Remember: Do not eat or drink after midnight the night before your surgery   Medications to take morning of surgery with a sip of water include: acetaminophen (TYLENOL)  If needed amLODipine (NORVASC)  sertraline (ZOLOFT)    As of today, STOP taking any  Aleve, Naproxen, Ibuprofen, Motrin, Advil, Goody's, BC's, all herbal medications, fish oil, and all vitamins.    The Morning of Surgery Do not wear jewelry Do not wear lotions, powders, colognes, or deodorant   Men may shave face and neck. Do not bring valuables to the hospital. [facility] is not responsible for any belongings or valuables. If you are a smoker, DO NOT Smoke 24 hours prior to surgery If you wear a CPAP at night please bring your mask the morning of surgery  Remember that you must have someone to transport you home after your surgery, and remain with you for 24 hours if you are discharged the same day. Please bring cases for contacts, glasses, hearing aids, dentures or bridgework because it cannot be worn into surgery.   Patients discharged the day of surgery will not be allowed to drive home.   Please shower the NIGHT BEFORE SURGERY and the MORNING OF SURGERY with DIAL Soap. Wear comfortable clothes the morning of surgery. Oral Hygiene is also important to reduce your risk of infection.  Remember - BRUSH YOUR TEETH THE  MORNING OF SURGERY WITH YOUR REGULAR TOOTHPASTE  Patient denies shortness of breath, fever, cough and chest pain.

## 2021-01-05 ENCOUNTER — Ambulatory Visit (HOSPITAL_COMMUNITY)
Admission: RE | Admit: 2021-01-05 | Discharge: 2021-01-05 | Disposition: A | Discharge status: Home / Self Care | Payer: 59 | Source: Ambulatory Visit | Attending: Emergency Medicine | Admitting: Emergency Medicine

## 2021-01-05 ENCOUNTER — Ambulatory Visit (HOSPITAL_COMMUNITY): Payer: 59 | Admitting: Certified Registered Nurse Anesthetist

## 2021-01-05 ENCOUNTER — Ambulatory Visit (HOSPITAL_COMMUNITY): Payer: 59

## 2021-01-05 ENCOUNTER — Other Ambulatory Visit: Payer: Self-pay

## 2021-01-05 ENCOUNTER — Encounter (HOSPITAL_COMMUNITY)
Admission: RE | Disposition: A | Discharge status: Home / Self Care | Payer: Self-pay | Source: Ambulatory Visit | Attending: Emergency Medicine

## 2021-01-05 ENCOUNTER — Encounter (HOSPITAL_COMMUNITY): Payer: Self-pay | Admitting: Emergency Medicine

## 2021-01-05 DIAGNOSIS — R590 Localized enlarged lymph nodes: Secondary | ICD-10-CM | POA: Diagnosis not present

## 2021-01-05 DIAGNOSIS — Z9889 Other specified postprocedural states: Secondary | ICD-10-CM

## 2021-01-05 DIAGNOSIS — Z8249 Family history of ischemic heart disease and other diseases of the circulatory system: Secondary | ICD-10-CM | POA: Insufficient documentation

## 2021-01-05 DIAGNOSIS — Z885 Allergy status to narcotic agent status: Secondary | ICD-10-CM | POA: Diagnosis not present

## 2021-01-05 DIAGNOSIS — I509 Heart failure, unspecified: Secondary | ICD-10-CM

## 2021-01-05 DIAGNOSIS — Z8616 Personal history of COVID-19: Secondary | ICD-10-CM | POA: Insufficient documentation

## 2021-01-05 DIAGNOSIS — Z803 Family history of malignant neoplasm of breast: Secondary | ICD-10-CM | POA: Diagnosis not present

## 2021-01-05 DIAGNOSIS — Z7982 Long term (current) use of aspirin: Secondary | ICD-10-CM | POA: Diagnosis not present

## 2021-01-05 DIAGNOSIS — C3411 Malignant neoplasm of upper lobe, right bronchus or lung: Secondary | ICD-10-CM | POA: Insufficient documentation

## 2021-01-05 DIAGNOSIS — Z79899 Other long term (current) drug therapy: Secondary | ICD-10-CM | POA: Insufficient documentation

## 2021-01-05 DIAGNOSIS — F32A Depression, unspecified: Secondary | ICD-10-CM | POA: Diagnosis not present

## 2021-01-05 DIAGNOSIS — R918 Other nonspecific abnormal finding of lung field: Secondary | ICD-10-CM | POA: Diagnosis not present

## 2021-01-05 DIAGNOSIS — I10 Essential (primary) hypertension: Secondary | ICD-10-CM | POA: Diagnosis not present

## 2021-01-05 DIAGNOSIS — Z7722 Contact with and (suspected) exposure to environmental tobacco smoke (acute) (chronic): Secondary | ICD-10-CM | POA: Insufficient documentation

## 2021-01-05 DIAGNOSIS — Z888 Allergy status to other drugs, medicaments and biological substances status: Secondary | ICD-10-CM | POA: Insufficient documentation

## 2021-01-05 HISTORY — PX: BRONCHIAL NEEDLE ASPIRATION BIOPSY: SHX5106

## 2021-01-05 HISTORY — PX: VIDEO BRONCHOSCOPY: SHX5072

## 2021-01-05 HISTORY — PX: ENDOBRONCHIAL ULTRASOUND: SHX5096

## 2021-01-05 HISTORY — PX: BRONCHIAL BRUSHINGS: SHX5108

## 2021-01-05 HISTORY — PX: BRONCHIAL BIOPSY: SHX5109

## 2021-01-05 LAB — BASIC METABOLIC PANEL
Anion gap: 11 (ref 5–15)
BUN: 5 mg/dL — ABNORMAL LOW (ref 6–20)
CO2: 26 mmol/L (ref 22–32)
Calcium: 8.8 mg/dL — ABNORMAL LOW (ref 8.9–10.3)
Chloride: 99 mmol/L (ref 98–111)
Creatinine, Ser: 0.76 mg/dL (ref 0.61–1.24)
GFR, Estimated: >60 mL/min (ref >60)
Glucose, Bld: 144 mg/dL — ABNORMAL HIGH (ref 70–99)
Potassium: 3 mmol/L — ABNORMAL LOW (ref 3.5–5.1)
Sodium: 136 mmol/L (ref 135–145)

## 2021-01-05 LAB — CBC
HCT: 36.2 % — ABNORMAL LOW (ref 39.0–52.0)
Hemoglobin: 12 g/dL — ABNORMAL LOW (ref 13.0–17.0)
MCH: 30.6 pg (ref 26.0–34.0)
MCHC: 33.1 g/dL (ref 30.0–36.0)
MCV: 92.3 fL (ref 80.0–100.0)
Platelets: 320 10*3/uL (ref 150–400)
RBC: 3.92 MIL/uL — ABNORMAL LOW (ref 4.22–5.81)
RDW: 14.2 % (ref 11.5–15.5)
WBC: 11.1 10*3/uL — ABNORMAL HIGH (ref 4.0–10.5)
nRBC: 0 % (ref 0.0–0.2)

## 2021-01-05 IMAGING — DX DG CHEST 1V PORT
1 series · 1 of 1 positions shown · non-contrast
Comparison: Chest radiograph [DATE]; PET-CT [DATE]

CLINICAL DATA: Status post bronchoscopy

EXAM:
PORTABLE CHEST 1 VIEW

[chest ap]
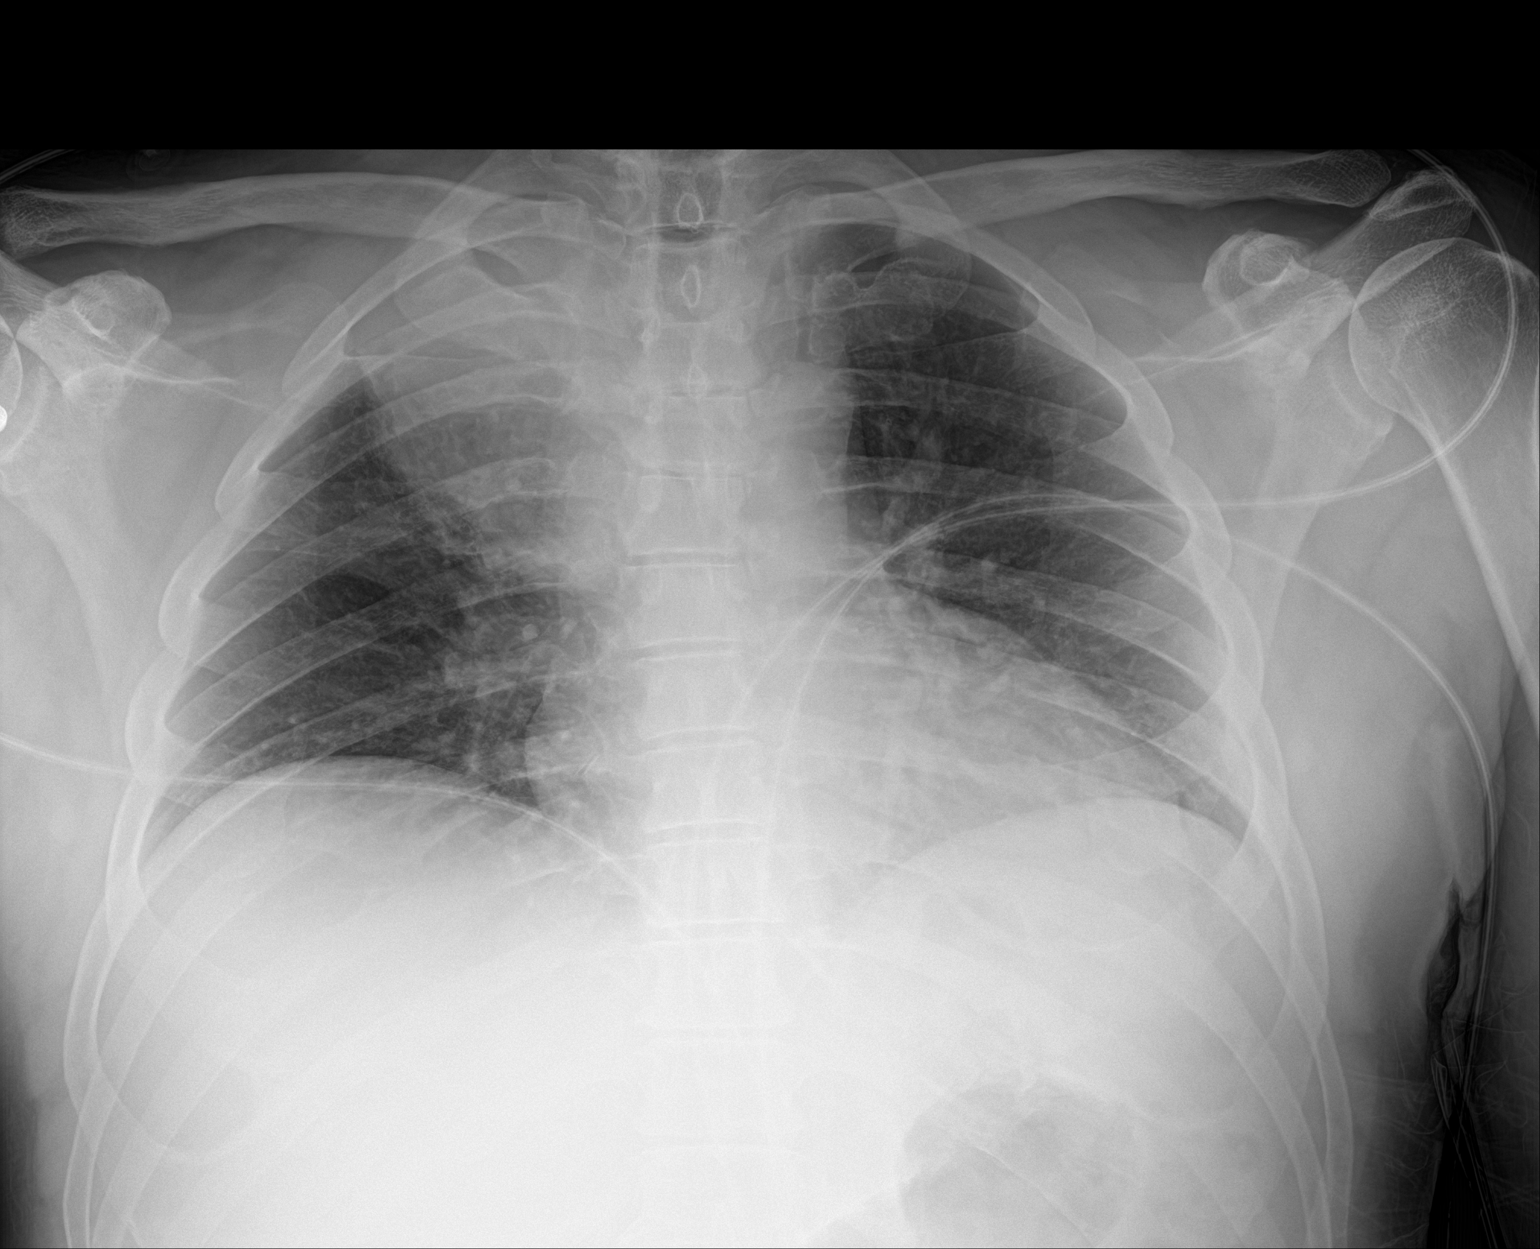

[1 of 1 positions shown; findings below may reference images not displayed]

FINDINGS: Mass in the right apex region with suspected postobstructive
pneumonitis noted. Lungs elsewhere are clear. Heart size and
pulmonary vascularity are normal. No adenopathy appreciable by
radiography no bone lesions. No pneumothorax.
IMPRESSION: Mass right apex with probable postobstructive pneumonitis. There may
be a degree of hemorrhage in this area as well. No pneumothorax.
Lungs elsewhere clear. Cardiac silhouette within normal limits.

## 2021-01-05 SURGERY — BRONCHOSCOPY, WITH FLUOROSCOPY
Anesthesia: General

## 2021-01-05 MED ORDER — CHLORHEXIDINE GLUCONATE 0.12 % MT SOLN
15.0000 mL | Freq: Once | OROMUCOSAL | Status: AC
  Administered 2021-01-05: 15 mL via OROMUCOSAL
  Filled 2021-01-05 (×2): qty 15

## 2021-01-05 MED ORDER — ONDANSETRON HCL 4 MG/2ML IJ SOLN
INTRAMUSCULAR | Status: DC | PRN
  Administered 2021-01-05: 4 mg via INTRAVENOUS

## 2021-01-05 MED ORDER — HYDROMORPHONE HCL 1 MG/ML IJ SOLN: 0.2500 mg | INTRAMUSCULAR | Status: DC | PRN

## 2021-01-05 MED ORDER — DEXAMETHASONE SODIUM PHOSPHATE 10 MG/ML IJ SOLN
INTRAMUSCULAR | Status: DC | PRN
  Administered 2021-01-05: 5 mg via INTRAVENOUS

## 2021-01-05 MED ORDER — PHENYLEPHRINE 40 MCG/ML (10ML) SYRINGE FOR IV PUSH (FOR BLOOD PRESSURE SUPPORT)
PREFILLED_SYRINGE | INTRAVENOUS | Status: DC | PRN
  Administered 2021-01-05: 80 ug via INTRAVENOUS

## 2021-01-05 MED ORDER — PROMETHAZINE HCL 25 MG/ML IJ SOLN
6.2500 mg | INTRAMUSCULAR | Status: DC | PRN
Start: 2021-01-05 — End: 2021-01-05

## 2021-01-05 MED ORDER — MEPERIDINE HCL 100 MG/ML IJ SOLN: 6.2500 mg | INTRAMUSCULAR | Status: DC | PRN

## 2021-01-05 MED ORDER — FENTANYL CITRATE (PF) 250 MCG/5ML IJ SOLN
INTRAMUSCULAR | Status: DC | PRN
  Administered 2021-01-05: 100 ug via INTRAVENOUS

## 2021-01-05 MED ORDER — PHENYLEPHRINE HCL-NACL 10-0.9 MG/250ML-% IV SOLN
INTRAVENOUS | Status: DC | PRN
  Administered 2021-01-05: 20 ug/min via INTRAVENOUS

## 2021-01-05 MED ORDER — MIDAZOLAM HCL 2 MG/2ML IJ SOLN
INTRAMUSCULAR | Status: DC | PRN
  Administered 2021-01-05: 2 mg via INTRAVENOUS

## 2021-01-05 MED ORDER — LACTATED RINGERS IV SOLN: INTRAVENOUS | Status: DC

## 2021-01-05 MED ORDER — PROPOFOL 10 MG/ML IV BOLUS
INTRAVENOUS | Status: DC | PRN
  Administered 2021-01-05: 200 mg via INTRAVENOUS

## 2021-01-05 MED ORDER — LIDOCAINE 2% (20 MG/ML) 5 ML SYRINGE
INTRAMUSCULAR | Status: DC | PRN
  Administered 2021-01-05: 80 mg via INTRAVENOUS

## 2021-01-05 MED ORDER — ROCURONIUM BROMIDE 10 MG/ML (PF) SYRINGE
PREFILLED_SYRINGE | INTRAVENOUS | Status: DC | PRN
  Administered 2021-01-05: 80 mg via INTRAVENOUS

## 2021-01-05 MED ORDER — SUGAMMADEX SODIUM 200 MG/2ML IV SOLN
INTRAVENOUS | Status: DC | PRN
  Administered 2021-01-05: 400 mg via INTRAVENOUS

## 2021-01-05 MED ORDER — AMISULPRIDE (ANTIEMETIC) 5 MG/2ML IV SOLN: 10.0000 mg | Freq: Once | INTRAVENOUS | Status: DC | PRN

## 2021-01-05 NOTE — Anesthesia Procedure Notes (Signed)
Procedure Name: Intubation Date/Time: 01/05/2021 7:38 AM Performed by: Dorthea Cove, CRNA Pre-anesthesia Checklist: Patient identified, Emergency Drugs available, Suction available and Patient being monitored Patient Re-evaluated:Patient Re-evaluated prior to induction Oxygen Delivery Method: Circle system utilized Preoxygenation: Pre-oxygenation with 100% oxygen Induction Type: IV induction Ventilation: Mask ventilation without difficulty, Oral airway inserted - appropriate to patient size and Two handed mask ventilation required Laryngoscope Size: Mac and 4 Grade View: Grade II Tube type: Oral Tube size: 8.5 mm Number of attempts: 1 Airway Equipment and Method: Stylet and Oral airway Placement Confirmation: ETT inserted through vocal cords under direct vision,  positive ETCO2 and breath sounds checked- equal and bilateral Secured at: 23 cm Tube secured with: Tape Dental Injury: Teeth and Oropharynx as per pre-operative assessment

## 2021-01-05 NOTE — Discharge Instructions (Addendum)
Flexible Bronchoscopy, Care After This sheet gives you information about how to care for yourself after your test. Your doctor may also give you more specific instructions. If you have problems or questions, contact your doctor. Follow these instructions at home: Eating and drinking  Do not eat or drink anything (not even water) for 2 hours after your test, or until your numbing medicine (local anesthetic) wears off.  When your numbness is gone and your cough and gag reflexes have come back, you may: ? Eat only soft foods. ? Slowly drink liquids.  The day after the test, go back to your normal diet. Driving  Do not drive for 24 hours if you were given a medicine to help you relax (sedative).  Do not drive or use heavy machinery while taking prescription pain medicine. General instructions   Take over-the-counter and prescription medicines only as told by your doctor.  Return to your normal activities as told. Ask what activities are safe for you.  Do not use any products that have nicotine or tobacco in them. This includes cigarettes and e-cigarettes. If you need help quitting, ask your doctor.  Keep all follow-up visits as told by your doctor. This is important. It is very important if you had a tissue sample (biopsy) taken. Get help right away if:  You have shortness of breath that gets worse.  You get light-headed.  You feel like you are going to pass out (faint).  You have chest pain.  You cough up: ? More than a little blood. ? More blood than before. Summary  Do not eat or drink anything (not even water) for 2 hours after your test, or until your numbing medicine wears off.  Do not use cigarettes. Do not use e-cigarettes.  Get help right away if you have chest pain.  Please call our office for any questions or concerns.  [phone].  This information is not intended to replace advice given to you by your health care provider. Make sure you discuss any  questions you have with your health care provider. Document Released: 06/23/2009 Document Revised: 08/08/2017 Document Reviewed: 09/13/2016 Elsevier Patient Education  2020 [employer].

## 2021-01-05 NOTE — H&P (Signed)
Christopher Carter is an 60 y.o. male.   Chief Complaint: Right upper lobe mass HPI: 60 year old gentleman with a very minimal tobacco exposure as a teen, history of hypertension and depression.  He underwent a chest x-ray in late March for cough and upper respiratory symptoms that showed a right upper lobe mass prompting a CT chest 12/07/2020 which I reviewed.  This showed a large right upper lobe mass 7.5 x 6.4 x 6.4 cm that abuts the pleural surface but without any obvious chest wall invasion.  Also noted were scattered mediastinal nodes 0.7 to 1.0 cm in the right paratracheal region.  PET scan 12/13/2020 confirmed hypermetabolism in the right upper lobe mass, did not show any mediastinal hypermetabolism or distant disease.  He tested positive for COVID-19 4/13, characterized by cough associated with some hemoptysis, fatigue, fever.  He is now improved.  He does describe about 12 pounds of weight loss over the last 6 weeks.  He presents now for evaluation for bronchoscopy to achieve tissue diagnosis.  No new issues reported.  He does still feel somewhat weak.  His cough is better.  His hemoptysis resolved about 2 days ago.  Past Medical History:  Diagnosis Date  . Hypertension     Past Surgical History:  Procedure Laterality Date  . COLON SURGERY  2017   bowel obstruction surgery per pt   . LAPAROSCOPY N/A 04/25/2017   Procedure: LAPAROSCOPY ASSISTED ILEO-CECECTOMY WITH SMALL BOWEL RESECTION WITH ANASTOMOSIS;  Surgeon: Excell Seltzer, MD;  Location: [facility];  Service: General;  Laterality: N/A;    Family history: CAD and hypertension in his father, hypertension in his mother. Breast cancer in 2 of his sisters in their late 68s  Social History:  reports that he has never smoked. He has never used smokeless tobacco. He reports previous alcohol use of about 5.0 standard drinks of alcohol per week. He reports that he does not use drugs.  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  He does drink moderate alcohol.  Does not smoke.  Allergies:  Allergies  Allergen Reactions  . Oxycodone Hcl Other (See Comments)    Pt reported "seeing things" and " feeling unusual."  . Bupropion Nausea And Vomiting    Medications Prior to Admission  Medication Sig Dispense Refill  . acetaminophen (TYLENOL) 500 MG tablet Take 500 mg by mouth 2 (two) times daily as needed for moderate pain or headache.    Marland Kitchen amLODipine (NORVASC) 5 MG tablet Take 15 mg by mouth daily.    . Ascorbic Acid (VITAMIN C) 1000 MG tablet Take 1,000 mg by mouth daily.    Marland Kitchen aspirin EC 81 MG tablet Take 81 mg by mouth daily. Swallow whole.    . Coenzyme Q10 (COQ10) 100 MG CAPS Take 100 mg by mouth daily.    Marland Kitchen HAWTHORNE BERRY PO Take 1 tablet by mouth daily.    Marland Kitchen HYDROcodone-homatropine (HYCODAN) 5-1.5 MG/5ML syrup Take 5 mLs by mouth every 6 (six) hours as needed for cough. 120 mL 0  . irbesartan (AVAPRO) 300 MG tablet Take 300 mg by mouth daily.    Marland Kitchen latanoprost (XALATAN) 0.005 % ophthalmic solution Place 1 drop into both eyes at bedtime.    . Menthol, Topical Analgesic, (BIOFREEZE EX) Apply 1 application topically daily as needed (pain).    Marland Kitchen OVER THE COUNTER MEDICATION Take 1 tablet by mouth 3 (three) times daily. Jointprin otc supplement    . QUEtiapine (SEROQUEL) 50 MG tablet Take  50 mg by mouth at bedtime.    . Red Yeast Rice Extract (RED YEAST RICE PO) Take 2 tablets by mouth daily.    . sertraline (ZOLOFT) 100 MG tablet Take 100 mg by mouth daily.    . TURMERIC PO Take 1 tablet by mouth daily.    . vitamin B-12 (CYANOCOBALAMIN) 1000 MCG tablet Take 1,000 mcg by mouth daily.      Results for orders placed or performed during the hospital encounter of 01/05/21 (from the past 48 hour(s))  CBC per protocol     Status: Abnormal   Collection Time: 01/05/21  6:33 AM  Result Value Ref Range   WBC 11.1 (H) 4.0 - 10.5 K/uL   RBC 3.92 (L) 4.22 - 5.81 MIL/uL   Hemoglobin 12.0 (L)  13.0 - 17.0 g/dL   HCT 36.2 (L) 39.0 - 52.0 %   MCV 92.3 80.0 - 100.0 fL   MCH 30.6 26.0 - 34.0 pg   MCHC 33.1 30.0 - 36.0 g/dL   RDW 14.2 11.5 - 15.5 %   Platelets 320 150 - 400 K/uL   nRBC 0.0 0.0 - 0.2 %    Comment: Performed at [facility], [place] [address], [place], [place] [address]   No results found.  Review of Systems  Blood pressure (!) 154/94, pulse 85, temperature 98.8 F (37.1 C), temperature source Oral, resp. rate 18, height 5\' 10"  (1.778 m), weight 86.2 kg. Physical Exam  Gen: Pleasant, well-nourished, in no distress,  normal affect  ENT: No lesions,  mouth clear,  oropharynx clear, no postnasal drip, M2 airway  Neck: No JVD, no stridor  Lungs: No use of accessory muscles, no crackles or wheezing on normal respiration, no wheeze on forced expiration  Cardiovascular: RRR, heart sounds normal, no murmur or gallops, no peripheral edema  Abdomen: soft and NT, no HSM,  BS normal  Musculoskeletal: No deformities, no cyanosis or clubbing  Neuro: alert, awake, non focal  Skin: Warm, no lesions or rash   Assessment/Plan Large right upper lobe mass consistent with primary lung malignancy.  Also with some right paratracheal mediastinal lymph nodes, not hypermetabolic or pathologically enlarged.  Discussed the rationale for bronchoscopy with right upper lobe mass biopsies, endobronchial ultrasound to localize and evaluate paratracheal lymph nodes.  Patient understands the benefits, risks and elect to proceed.  All questions answered.  No barriers identified.  He had COVID-19 diagnosed 4/13, is now asymptomatic.  Collene Gobble, MD 01/05/2021, 7:09 AM

## 2021-01-05 NOTE — Anesthesia Preprocedure Evaluation (Signed)
Anesthesia Evaluation  Patient identified by MRN, date of birth, ID band Patient awake    Reviewed: Allergy & Precautions, H&P , NPO status , Patient's Chart, lab work & pertinent test results  Airway Mallampati: II  TM Distance: >3 FB Neck ROM: full    Dental no notable dental hx.    Pulmonary neg pulmonary ROS,    Pulmonary exam normal breath sounds clear to auscultation       Cardiovascular hypertension, Pt. on medications Normal cardiovascular exam Rhythm:regular Rate:Normal     Neuro/Psych    GI/Hepatic SBO   Endo/Other    Renal/GU      Musculoskeletal   Abdominal   Peds  Hematology   Anesthesia Other Findings   Reproductive/Obstetrics                             Anesthesia Physical  Anesthesia Plan  ASA: III  Anesthesia Plan: General   Post-op Pain Management:    Induction: Intravenous  PONV Risk Score and Plan: 2 and Ondansetron, Midazolam and Treatment may vary due to age or medical condition  Airway Management Planned: Oral ETT  Additional Equipment:   Intra-op Plan:   Post-operative Plan: Extubation in OR  Informed Consent: I have reviewed the patients History and Physical, chart, labs and discussed the procedure including the risks, benefits and alternatives for the proposed anesthesia with the patient or authorized representative who has indicated his/her understanding and acceptance.     Dental advisory given  Plan Discussed with: CRNA, Anesthesiologist and Surgeon  Anesthesia Plan Comments:         Anesthesia Quick Evaluation

## 2021-01-05 NOTE — Op Note (Signed)
Video Bronchoscopy with Endobronchial Ultrasound Procedure Note  Date of Operation: 01/05/2021  Pre-op Diagnosis: Right upper lobe mass with mediastinal adenopathy  Post-op Diagnosis: Same  Surgeon: Baltazar Apo  Assistants: None  Anesthesia: General endotracheal anesthesia  Operation: Flexible video fiberoptic bronchoscopy with endobronchial ultrasound and biopsies.  Estimated Blood Loss: 15 cc  Complications: None apparent  Indications and History: Christopher Carter is a 60 y.o. male with no history of tobacco who was found to have right upper lobe mass on imaging, hypermetabolic on PET scan.  Also some slightly enlarged right paratracheal lymph nodes (not hypermetabolic).  Recommendation was made to achieve a tissue diagnosis via bronchoscopy with endobronchial ultrasound.  The risks, benefits, complications, treatment options and expected outcomes were discussed with the patient.  The possibilities of pneumothorax, pneumonia, reaction to medication, pulmonary aspiration, perforation of a viscus, bleeding, failure to diagnose a condition and creating a complication requiring transfusion or operation were discussed with the patient who freely signed the consent.    Description of Procedure: The patient was examined in the preoperative area and history and data from the preprocedure consultation were reviewed. It was deemed appropriate to proceed.  The patient was taken to [facility] endoscopy room 1, identified as Christopher Carter and the procedure verified as Flexible Video Fiberoptic Bronchoscopy.  A Time Out was held and the above information confirmed. After being taken to the operating room general anesthesia was initiated and the patient  was orally intubated. The video fiberoptic bronchoscope was introduced via the endotracheal tube and a general inspection was performed which showed normal airways throughout.  There were no endobronchial lesions but the apical segment of the  right upper lobe was slightly narrowed, smaller diameter than the surrounding airways.  A curved working channel was used to access the apical posterior subsegmental airway of the right upper lobe and under fluoroscopic guidance transbronchial brushings, transbronchial needle biopsies and transbronchial forceps biopsies were performed for cytology and pathology.  The standard scope was then withdrawn and the endobronchial ultrasound was used to identify and characterize the peritracheal, hilar and bronchial lymph nodes. Inspection showed a slightly enlarged node at station 4R.  No other enlarged nodes were identified. Using real-time ultrasound guidance Wang needle biopsies were take from Station 4R node and were sent for cytology. The patient tolerated the procedure well without apparent complications. There was no significant blood loss. The bronchoscope was withdrawn. Anesthesia was reversed and the patient was taken to the PACU for recovery.  A postprocedural chest x-ray is pending.  Samples: 1. Wang needle biopsies from 4R node 2.  Transbronchial brushings from right upper lobe mass 3.  Transbronchial forceps biopsies from right upper lobe mass 4.  Transbronchial needle biopsies from right upper lobe mass  Plans:  The patient will be discharged from the PACU to home when recovered from anesthesia. We will review the cytology, pathology results with the patient when they become available. Outpatient followup will be with Dr. Julien Nordmann with Medical Oncology and with Dr. Lamonte Sakai.    Baltazar Apo, MD, PhD 01/05/2021, 8:31 AM [facility] [phone] or if no answer before 7:00PM call [phone] For any issues after 7:00PM please call eLink [phone]

## 2021-01-05 NOTE — Transfer of Care (Signed)
Immediate Anesthesia Transfer of Care Note  Patient: Christopher Carter  Procedure(s) Performed: VIDEO BRONCHOSCOPY WITH FLUORO (N/A ) ENDOBRONCHIAL ULTRASOUND (N/A ) BRONCHIAL BRUSHINGS BRONCHIAL BIOPSIES BRONCHIAL NEEDLE ASPIRATION BIOPSIES  Patient Location: PACU  Anesthesia Type:General  Level of Consciousness: awake, alert  and oriented  Airway & Oxygen Therapy: Patient Spontanous Breathing  Post-op Assessment: Report given to RN and Post -op Vital signs reviewed and stable  Post vital signs: Reviewed and stable  Last Vitals:  Vitals Value Taken Time  BP 124/83 01/05/21 0923  Temp 37.1 C 01/05/21 0840  Pulse 87 01/05/21 0931  Resp 16 01/05/21 0931  SpO2 92 % 01/05/21 0931  Vitals shown include unvalidated device data.  Last Pain:  Vitals:   01/05/21 0855  TempSrc:   PainSc: 0-No pain      Patients Stated Pain Goal: 4 (82/50/03 7048)  Complications: No complications documented.

## 2021-01-05 NOTE — Anesthesia Postprocedure Evaluation (Signed)
Anesthesia Post Note  Patient: Malek A Kryder  Procedure(s) Performed: VIDEO BRONCHOSCOPY WITH FLUORO (N/A ) ENDOBRONCHIAL ULTRASOUND (N/A ) BRONCHIAL BRUSHINGS BRONCHIAL BIOPSIES BRONCHIAL NEEDLE ASPIRATION BIOPSIES     Patient location during evaluation: PACU Anesthesia Type: General Level of consciousness: awake and alert Pain management: pain level controlled Vital Signs Assessment: post-procedure vital signs reviewed and stable Respiratory status: spontaneous breathing, nonlabored ventilation and respiratory function stable Cardiovascular status: blood pressure returned to baseline and stable Postop Assessment: no apparent nausea or vomiting Anesthetic complications: no   No complications documented.  Last Vitals:  Vitals:   01/05/21 0855 01/05/21 0940  BP: 140/85 130/79  Pulse: 93 87  Resp: 12 18  Temp:  36.8 C  SpO2: 92% 95%    Last Pain:  Vitals:   01/05/21 0940  TempSrc:   PainSc: 0-No pain                 Lynda Rainwater

## 2021-01-08 ENCOUNTER — Encounter (HOSPITAL_COMMUNITY): Payer: Self-pay | Admitting: Emergency Medicine

## 2021-01-08 LAB — CYTOLOGY - NON PAP

## 2021-01-09 ENCOUNTER — Other Ambulatory Visit: Payer: Self-pay

## 2021-01-09 ENCOUNTER — Inpatient Hospital Stay: Payer: 59

## 2021-01-09 ENCOUNTER — Inpatient Hospital Stay: Payer: 59 | Attending: Internal Medicine | Admitting: Internal Medicine

## 2021-01-09 ENCOUNTER — Encounter: Payer: Self-pay | Admitting: Radiology

## 2021-01-09 VITALS — BP 105/69 | HR 95 | Temp 98.3°F | Resp 18 | Ht 70.0 in | Wt 190.0 lb

## 2021-01-09 DIAGNOSIS — R053 Chronic cough: Secondary | ICD-10-CM | POA: Diagnosis not present

## 2021-01-09 DIAGNOSIS — C3411 Malignant neoplasm of upper lobe, right bronchus or lung: Secondary | ICD-10-CM

## 2021-01-09 DIAGNOSIS — C3490 Malignant neoplasm of unspecified part of unspecified bronchus or lung: Secondary | ICD-10-CM

## 2021-01-09 LAB — CMP (CANCER CENTER ONLY)
ALT: 24 U/L (ref 0–44)
AST: 20 U/L (ref 15–41)
Albumin: 3.4 g/dL — ABNORMAL LOW (ref 3.5–5.0)
Alkaline Phosphatase: 79 U/L (ref 38–126)
Anion gap: 14 (ref 5–15)
BUN: 10 mg/dL (ref 6–20)
CO2: 21 mmol/L — ABNORMAL LOW (ref 22–32)
Calcium: 9.3 mg/dL (ref 8.9–10.3)
Chloride: 106 mmol/L (ref 98–111)
Creatinine: 0.78 mg/dL (ref 0.61–1.24)
GFR, Estimated: >60 mL/min (ref >60)
Glucose, Bld: 187 mg/dL — ABNORMAL HIGH (ref 70–99)
Potassium: 3.1 mmol/L — ABNORMAL LOW (ref 3.5–5.1)
Sodium: 141 mmol/L (ref 135–145)
Total Bilirubin: <0.1 mg/dL — ABNORMAL LOW (ref 0.3–1.2)
Total Protein: 7.1 g/dL (ref 6.5–8.1)

## 2021-01-09 LAB — CBC WITH DIFFERENTIAL (CANCER CENTER ONLY)
Abs Immature Granulocytes: 0.09 10*3/uL — ABNORMAL HIGH (ref 0.00–0.07)
Basophils Absolute: 0.1 10*3/uL (ref 0.0–0.1)
Basophils Relative: 1 %
Eosinophils Absolute: 0.8 10*3/uL — ABNORMAL HIGH (ref 0.0–0.5)
Eosinophils Relative: 6 %
HCT: 36.6 % — ABNORMAL LOW (ref 39.0–52.0)
Hemoglobin: 12.3 g/dL — ABNORMAL LOW (ref 13.0–17.0)
Immature Granulocytes: 1 %
Lymphocytes Relative: 15 %
Lymphs Abs: 1.9 10*3/uL (ref 0.7–4.0)
MCH: 30.4 pg (ref 26.0–34.0)
MCHC: 33.6 g/dL (ref 30.0–36.0)
MCV: 90.6 fL (ref 80.0–100.0)
Monocytes Absolute: 1.6 10*3/uL — ABNORMAL HIGH (ref 0.1–1.0)
Monocytes Relative: 13 %
Neutro Abs: 8.3 10*3/uL — ABNORMAL HIGH (ref 1.7–7.7)
Neutrophils Relative %: 64 %
Platelet Count: 334 10*3/uL (ref 150–400)
RBC: 4.04 MIL/uL — ABNORMAL LOW (ref 4.22–5.81)
RDW: 14.2 % (ref 11.5–15.5)
WBC Count: 12.7 10*3/uL — ABNORMAL HIGH (ref 4.0–10.5)
nRBC: 0 % (ref 0.0–0.2)

## 2021-01-09 LAB — CYTOLOGY - NON PAP

## 2021-01-09 MED ORDER — POTASSIUM CHLORIDE CRYS ER 20 MEQ PO TBCR: 20.0000 meq | EXTENDED_RELEASE_TABLET | Freq: Every day | ORAL | 0 refills | Status: DC

## 2021-01-09 NOTE — Progress Notes (Signed)
Christopher Carter [phone]   [phone]  OFFICE PROGRESS NOTE  [name], MD [place] [address]  DIAGNOSIS: Stage IIIA (T4, N0, M0) non-small cell lung cancer, adenocarcinoma presented with large right upper lobe pulmonary mass diagnosed in April 2022.  PRIOR THERAPY: None  CURRENT THERAPY: None  INTERVAL HISTORY: Christopher Carter 60 y.o. male returns to the clinic today for follow-up visit accompanied by his wife.  The patient is feeling fine today with no concerning complaints except for the persistent cough and chest tightness.  He denied having any shortness of breath or hemoptysis.  He denied having any fever or chills.  He has no nausea, vomiting, diarrhea or constipation.  He denied having any headache or visual changes.  He has no fever or chills.  He underwent bronchoscopy under the care of Dr. Lamonte Sakai on 01/05/2021 and the final pathology was consistent with non-small cell lung cancer, adenocarcinoma.  The patient also had MRI of the brain on December 29, 2020 and it was negative for metastatic disease to the brain.  He is here today for evaluation and discussion of his treatment options.  MEDICAL HISTORY: Past Medical History:  Diagnosis Date  . Hypertension     ALLERGIES:  is allergic to oxycodone hcl and bupropion.  MEDICATIONS:  Current Outpatient Medications  Medication Sig Dispense Refill  . acetaminophen (TYLENOL) 500 MG tablet Take 500 mg by mouth 2 (two) times daily as needed for moderate pain or headache.    Marland Kitchen amLODipine (NORVASC) 5 MG tablet Take 15 mg by mouth daily.    . Ascorbic Acid (VITAMIN C) 1000 MG tablet Take 1,000 mg by mouth daily.    Marland Kitchen aspirin EC 81 MG tablet Take 81 mg by mouth daily. Swallow whole.    . Coenzyme Q10 (COQ10) 100 MG CAPS Take 100 mg by mouth daily.    Marland Kitchen HAWTHORNE BERRY PO Take 1 tablet by mouth daily.    Marland Kitchen HYDROcodone-homatropine (HYCODAN) 5-1.5 MG/5ML syrup Take 5  mLs by mouth every 6 (six) hours as needed for cough. 120 mL 0  . irbesartan (AVAPRO) 300 MG tablet Take 300 mg by mouth daily.    Marland Kitchen latanoprost (XALATAN) 0.005 % ophthalmic solution Place 1 drop into both eyes at bedtime.    . Menthol, Topical Analgesic, (BIOFREEZE EX) Apply 1 application topically daily as needed (pain).    Marland Kitchen OVER THE COUNTER MEDICATION Take 1 tablet by mouth 3 (three) times daily. Jointprin otc supplement    . QUEtiapine (SEROQUEL) 50 MG tablet Take 50 mg by mouth at bedtime.    . Red Yeast Rice Extract (RED YEAST RICE PO) Take 2 tablets by mouth daily.    . sertraline (ZOLOFT) 100 MG tablet Take 100 mg by mouth daily.    . TURMERIC PO Take 1 tablet by mouth daily.    . vitamin B-12 (CYANOCOBALAMIN) 1000 MCG tablet Take 1,000 mcg by mouth daily.     No current facility-administered medications for this visit.    SURGICAL HISTORY:  Past Surgical History:  Procedure Laterality Date  . BRONCHIAL BIOPSY  01/05/2021   Procedure: BRONCHIAL BIOPSIES;  Surgeon: Collene Gobble, MD;  Location: [facility] ENDOSCOPY;  Service: Cardiopulmonary;;  . BRONCHIAL BRUSHINGS  01/05/2021   Procedure: BRONCHIAL BRUSHINGS;  Surgeon: Collene Gobble, MD;  Location: [facility];  Service: Cardiopulmonary;;  . BRONCHIAL NEEDLE ASPIRATION BIOPSY  01/05/2021   Procedure: BRONCHIAL NEEDLE ASPIRATION BIOPSIES;  Surgeon: Collene Gobble, MD;  Location: [facility] ENDOSCOPY;  Service: Cardiopulmonary;;  . COLON SURGERY  2017   bowel obstruction surgery per pt   . ENDOBRONCHIAL ULTRASOUND N/A 01/05/2021   Procedure: ENDOBRONCHIAL ULTRASOUND;  Surgeon: Collene Gobble, MD;  Location: [facility];  Service: Cardiopulmonary;  Laterality: N/A;  . LAPAROSCOPY N/A 04/25/2017   Procedure: LAPAROSCOPY ASSISTED ILEO-CECECTOMY WITH SMALL BOWEL RESECTION WITH ANASTOMOSIS;  Surgeon: Excell Seltzer, MD;  Location: [facility]S;  Service: General;  Laterality: N/A;  . VIDEO BRONCHOSCOPY N/A 01/05/2021   Procedure: VIDEO BRONCHOSCOPY  WITH FLUORO;  Surgeon: Collene Gobble, MD;  Location: [facility];  Service: Cardiopulmonary;  Laterality: N/A;    REVIEW OF SYSTEMS:  Constitutional: positive for fatigue Eyes: negative Ears, nose, mouth, throat, and face: negative Respiratory: positive for cough Cardiovascular: negative Gastrointestinal: negative Genitourinary:negative Integument/breast: negative Hematologic/lymphatic: negative Musculoskeletal:negative Neurological: negative Behavioral/Psych: negative Endocrine: negative Allergic/Immunologic: negative   PHYSICAL EXAMINATION: General appearance: alert, cooperative, fatigued and no distress Head: Normocephalic, without obvious abnormality, atraumatic Neck: no adenopathy, no JVD, supple, symmetrical, trachea midline and thyroid not enlarged, symmetric, no tenderness/mass/nodules Lymph nodes: Cervical, supraclavicular, and axillary nodes normal. Resp: clear to auscultation bilaterally Back: symmetric, no curvature. ROM normal. No CVA tenderness. Cardio: regular rate and rhythm, S1, S2 normal, no murmur, click, rub or gallop GI: soft, non-tender; bowel sounds normal; no masses,  no organomegaly Extremities: extremities normal, atraumatic, no cyanosis or edema Neurologic: Alert and oriented X 3, normal strength and tone. Normal symmetric reflexes. Normal coordination and gait  ECOG PERFORMANCE STATUS: 1 - Symptomatic but completely ambulatory  Blood pressure 105/69, pulse 95, temperature 98.3 F (36.8 C), temperature source Tympanic, resp. rate 18, height 5\' 10"  (1.778 m), weight 190 lb (86.2 kg), SpO2 99 %.  LABORATORY DATA: Lab Results  Component Value Date   WBC 12.7 (H) 01/09/2021   HGB 12.3 (L) 01/09/2021   HCT 36.6 (L) 01/09/2021   MCV 90.6 01/09/2021   PLT 334 01/09/2021      Chemistry      Component Value Date/Time   NA 136 01/05/2021 0633   K 3.0 (L) 01/05/2021 0633   CL 99 01/05/2021 0633   CO2 26 01/05/2021 0633   BUN 5 (L) 01/05/2021 0633    CREATININE 0.76 01/05/2021 0633      Component Value Date/Time   CALCIUM 8.8 (L) 01/05/2021 0633   ALKPHOS 52 04/23/2017 0921   AST 35 04/23/2017 0921   ALT 38 04/23/2017 0921   BILITOT 1.2 04/23/2017 0921       RADIOGRAPHIC STUDIES: MR BRAIN W WO CONTRAST  Result Date: 12/30/2020 CLINICAL DATA:  Non-small cell lung cancer staging EXAM: MRI HEAD WITHOUT AND WITH CONTRAST TECHNIQUE: Multiplanar, multiecho pulse sequences of the brain and surrounding structures were obtained without and with intravenous contrast. CONTRAST:  16mL GADAVIST GADOBUTROL 1 MMOL/ML IV SOLN COMPARISON:  None. FINDINGS: Brain: No swelling or enhancement to suggest metastatic disease. No gray matter infarction, hemorrhage, hydrocephalus, extra-axial collection or mass lesion. Normal brain volume. No white matter disease. Vascular: Normal flow voids. Skull and upper cervical spine: No detected bony metastasis. Sinuses/Orbits: Negative IMPRESSION: Negative for metastatic disease to the brain. Electronically Signed   By: Monte Fantasia M.D.   On: 12/30/2020 09:29   NM PET Image Initial (PI) Skull Base To Thigh  Result Date: 12/14/2020 CLINICAL DATA:  Initial treatment strategy for right upper lobe mass. EXAM: NUCLEAR MEDICINE PET SKULL BASE TO THIGH TECHNIQUE: 9.5 mCi F-18 FDG was injected  intravenously. Full-ring PET imaging was performed from the skull base to thigh after the radiotracer. CT data was obtained and used for attenuation correction and anatomic localization. Fasting blood glucose: 138 mg/dl COMPARISON:  Chest CT 12/07/2020 FINDINGS: Mediastinal blood pool activity: SUV max 2.3 Liver activity: SUV max NA NECK: Marked focal hypermetabolism is identified in the anterior midline mouth, just posterior to the incisors with SUV max = 11.5. No bony or soft tissue lesion evident in this region on CT imaging although artifact from dental fillings somewhat obscures this region. Incidental CT findings: none CHEST: Large  right apical chest mass is markedly hypermetabolic with SUV max = 31. Central photopenia is consistent with necrosis. No evidence for hypermetabolic lymphadenopathy in the right hilum or mediastinum. Upper normal right paratracheal node measuring 9 mm short axis on image 65/9 demonstrates SUV max = 2.0. No other findings to suggest hypermetabolic metastases in the thorax. Incidental CT findings: Coronary artery calcification is evident. Atherosclerotic calcification is noted in the wall of the thoracic aorta. ABDOMEN/PELVIS: No abnormal hypermetabolic activity within the liver, pancreas, adrenal glands, or spleen. No hypermetabolic lymph nodes in the abdomen or pelvis. Incidental CT findings: Diffuse low attenuation of liver parenchyma compatible with fatty deposition. There is abdominal aortic atherosclerosis without aneurysm. SKELETON: No focal hypermetabolic activity to suggest skeletal metastasis. Incidental CT findings: none IMPRESSION: 1. Markedly hypermetabolic right upper lobe pulmonary mass consistent with primary bronchogenic neoplasm. No definite findings of hypermetabolic metastatic disease on today's study. Somewhat prominent upper normal sized right paratracheal node shows only low level FDG accumulation (below blood pool). 2. Indeterminate focal hypermetabolism in the anterior mouth which may be related to the tip of the tongue, anterior palate or anterior floor mouth as it projects just posterior to the incisors. This region should be amenable to clinical inspection. 3. Hepatic steatosis. 4.  Aortic Atherosclerois (ICD10-170.0) Electronically Signed   By: Misty Stanley M.D.   On: 12/14/2020 09:04   DG Chest Port 1 View  Result Date: 01/05/2021 CLINICAL DATA:  Status post bronchoscopy EXAM: PORTABLE CHEST 1 VIEW COMPARISON:  Chest radiograph December 04, 2020; PET-CT December 13, 2020 FINDINGS: Mass in the right apex region with suspected postobstructive pneumonitis noted. Lungs elsewhere are clear.  Heart size and pulmonary vascularity are normal. No adenopathy appreciable by radiography no bone lesions. No pneumothorax. IMPRESSION: Mass right apex with probable postobstructive pneumonitis. There may be a degree of hemorrhage in this area as well. No pneumothorax. Lungs elsewhere clear. Cardiac silhouette within normal limits. Electronically Signed   By: Lowella Grip III M.D.   On: 01/05/2021 09:11   DG C-ARM BRONCHOSCOPY  Result Date: 01/05/2021 C-ARM BRONCHOSCOPY: Fluoroscopy was utilized by the requesting physician.  No radiographic interpretation.    ASSESSMENT AND PLAN: This is a very pleasant 60 years old white male recently diagnosed with stage IIIa (T4, N0, M0) non-small cell lung cancer, adenocarcinoma presented with large right upper lobe lung mass with suspicious normal size mediastinal lymphadenopathy diagnosed in April 2022. I had a lengthy discussion with the patient and his wife today about his current condition and treatment options. I discussed the pathology results with him. I recommended for the patient to see cardiothoracic surgery for consideration and evaluation of surgical resection. If the patient is not a great surgical candidate or neoadjuvant treatment is required, we will see the patient sooner for this option. For the persistent cough, I will give him refill of Hycodan. The patient will have a follow-up appointment with me  after his evaluation by cardiothoracic surgery. He was advised to call immediately if he has any other concerning symptoms in the interval. The patient voices understanding of current disease status and treatment options and is in agreement with the current care plan.  All questions were answered. The patient knows to call the clinic with any problems, questions or concerns. We can certainly see the patient much sooner if necessary. The total time spent in the appointment was 30 minutes.  Disclaimer: This note was dictated with voice  recognition software. Similar sounding words can inadvertently be transcribed and may not be corrected upon review.

## 2021-01-10 NOTE — Research (Signed)
[id number] - Customer service manager for the Discovery and Validation of Biomarkers for the Prediction, Diagnosis, and Management of Disease  This Research Nurse has reviewed this patient's inclusion and exclusion criteria as a second review and confirms Alvon A Kuhar is eligible for study participation.  Patient may continue with enrollment. Maxwell Marion, RN, BSN, [facility] Clinical Research 01/10/2021 10:09 AM

## 2021-01-10 NOTE — Research (Signed)
[id number] - Customer service manager for the Discovery and Validation of Biomarkers for the Prediction, Diagnosis, and Management of Disease  01/09/21    3:45PM  CONSENT: Patient Christopher Carter was identified by this clinical research coordinator as a potential candidate for the above listed study.  This Clinical Research Coordinator met with Christopher Carter, [id number], on 01/09/21 in a manner and location that ensures patient privacy to discuss participation in the above listed research study.  Patient is Accompanied by his wife, Christopher Carter.  A copy of the informed consent document and separate HIPAA Authorization was provided to the patient.  Patient reads, speaks, and understands [place].    Patient was provided with the business card of this Coordinator and encouraged to contact the research team with any questions.  Patient was provided the option of taking informed consent documents home to review and was encouraged to review at their convenience with their support network, including other care providers. Patient is comfortable with making a decision regarding study participation today.  As outlined in the informed consent form, this Coordinator and Christopher Carter discussed the purpose of the research study, the investigational nature of the study, study procedures and requirements for study participation, potential risks and benefits of study participation, as well as alternatives to participation. The patient understands participation is voluntary and they may withdraw from study participation at any time.    Confidentiality and how the patient's information will be used as part of study participation were discussed.  Patient was informed there is reimbursement provided for their time and effort spent on trial participation.  The patient is encouraged to discuss research study participation with their insurance provider to determine what costs they may incur as part of  study participation, including research related injury.    All questions were answered to patient's satisfaction.  The informed consent and separate HIPAA Authorization was reviewed page by page.  The patient's mental and emotional status is appropriate to provide informed consent, and the patient verbalizes an understanding of study participation.  Patient has agreed to participate in the above listed research study and has voluntarily signed the informed consent date approved 04/14/2020; valid until 04/13/2021 and separate HIPAA Authorization, version 5, revised 08/25/2019 on 01/09/21 at 4:21 PM.  The patient was provided with a copy of the signed informed consent form and separate HIPAA Authorization for their reference.  No study specific procedures were obtained prior to the signing of the informed consent document.  Approximately 15 minutes were spent with the patient reviewing the informed consent documents.  Patient was not requested to complete a Release of Information form. Patient and his wife were thanked for their time and for patients participation in the above listed study.    PLAN: This coordinator plans to review patient's appointments to find an appropriate time to collect blood sample in accordance with protocol.   Carol Ada, RT(R)(T) Clinical Research Coordinator

## 2021-01-11 ENCOUNTER — Encounter: Payer: Self-pay | Admitting: *Deleted

## 2021-01-11 ENCOUNTER — Telehealth: Payer: Self-pay | Admitting: *Deleted

## 2021-01-11 NOTE — Telephone Encounter (Signed)
I followed up on Christopher Carter schedule. He is set up to see Dr. Roxan Hockey next week. I called to see if he knew the location of the appt. I was unable to reach but did leave vm message with my name and phone number to call.

## 2021-01-11 NOTE — Progress Notes (Signed)
See phone note

## 2021-01-15 ENCOUNTER — Other Ambulatory Visit: Payer: Self-pay

## 2021-01-15 ENCOUNTER — Encounter: Payer: Self-pay | Admitting: Thoracic Surgery (Cardiothoracic Vascular Surgery)

## 2021-01-15 ENCOUNTER — Other Ambulatory Visit: Payer: Self-pay | Admitting: *Deleted

## 2021-01-15 ENCOUNTER — Other Ambulatory Visit: Payer: Self-pay | Admitting: Thoracic Surgery (Cardiothoracic Vascular Surgery)

## 2021-01-15 ENCOUNTER — Institutional Professional Consult (permissible substitution) (INDEPENDENT_AMBULATORY_CARE_PROVIDER_SITE_OTHER): Payer: 59 | Admitting: Thoracic Surgery (Cardiothoracic Vascular Surgery)

## 2021-01-15 VITALS — BP 144/82 | HR 99 | Resp 20 | Ht 70.0 in | Wt 187.0 lb

## 2021-01-15 DIAGNOSIS — C3411 Malignant neoplasm of upper lobe, right bronchus or lung: Secondary | ICD-10-CM

## 2021-01-15 DIAGNOSIS — I10 Essential (primary) hypertension: Secondary | ICD-10-CM | POA: Insufficient documentation

## 2021-01-15 DIAGNOSIS — R918 Other nonspecific abnormal finding of lung field: Secondary | ICD-10-CM

## 2021-01-15 NOTE — H&P (View-Only) (Signed)
PCP is [name], MD Referring Provider is Curt Bears, MD  Chief Complaint  Patient presents with  . 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  Christopher Carter is a 60 year old non-smoker with a past medical history significant for hypertension and depression.  He has been feeling poorly since the first of the year.  He has had a cough that just would not go away.  He tested negative for COVID a couple of times.  His wife finally talked him into going to see the doctor and chest x-ray showed a right upper lobe mass.  He had a CT on 12/07/2020 which showed a 7.5 x 6.4 x 6.4 cm mass in the right upper lobe.  This abutted the pleural surface apically but there was no definite invasion.  PET/CT showed the mass was hypermetabolic.  There was no evidence of regional or distant metastatic disease.  He was referred to Dr. Earlie Server and then Dr. Lamonte Sakai.  Dr. Lamonte Sakai did a bronchoscopy and endobronchial ultrasound.  The mass was positive for non-small cell carcinoma.  He has a loss of appetite and has lost about 25 pounds over the past 3 months.  He has been wheezing and coughing up blood.  He does complain of some muscular pain in the right shoulder but thinks it is because been sleeping in an unusual position.  He does not have any burning or tingling pain.  He used to work on a pretty regular basis and frequently was [employer].  He has not been working out since the first of the year as he has not been feeling well.  He does complain of decreased energy.  Zubrod Score: At the time of surgery this patient's most appropriate activity status/level should be described as: []     0    Normal activity, no symptoms [x]     1    Restricted in physical strenuous activity but ambulatory, able to do out light work []     2    Ambulatory and  capable of self care, unable to do work activities, up and about >50 % of waking hours                              []     3    Only limited self care, in bed greater than 50% of waking hours []     4    Completely disabled, no self care, confined to bed or chair []     5    Moribund  Past Medical History:  Diagnosis Date  . Hypertension     Past Surgical History:  Procedure Laterality Date  . BRONCHIAL BIOPSY  01/05/2021   Procedure: BRONCHIAL BIOPSIES;  Surgeon: Collene Gobble, MD;  Location: [facility] ENDOSCOPY;  Service: Cardiopulmonary;;  . BRONCHIAL BRUSHINGS  01/05/2021   Procedure: BRONCHIAL BRUSHINGS;  Surgeon: Collene Gobble, MD;  Location: [place];  Service: Cardiopulmonary;;  . BRONCHIAL NEEDLE ASPIRATION BIOPSY  01/05/2021   Procedure: BRONCHIAL NEEDLE ASPIRATION BIOPSIES;  Surgeon: Collene Gobble, MD;  Location: [facility] ENDOSCOPY;  Service: Cardiopulmonary;;  . COLON SURGERY  2017   bowel obstruction surgery per pt   . ENDOBRONCHIAL ULTRASOUND N/A 01/05/2021   Procedure: ENDOBRONCHIAL ULTRASOUND;  Surgeon: Collene Gobble, MD;  Location:  [place] ENDOSCOPY;  Service: Cardiopulmonary;  Laterality: N/A;  . LAPAROSCOPY N/A 04/25/2017   Procedure: LAPAROSCOPY ASSISTED ILEO-CECECTOMY WITH SMALL BOWEL RESECTION WITH ANASTOMOSIS;  Surgeon: Excell Seltzer, MD;  Location: [facility] ORS;  Service: General;  Laterality: N/A;  . VIDEO BRONCHOSCOPY N/A 01/05/2021   Procedure: VIDEO BRONCHOSCOPY WITH FLUORO;  Surgeon: Collene Gobble, MD;  Location: [place];  Service: Cardiopulmonary;  Laterality: N/A;    History reviewed. No pertinent family history.  Social History Social History   Tobacco Use  . Smoking status: Never Smoker  . Smokeless tobacco: Never Used  Vaping Use  . Vaping Use: Never used  Substance Use Topics  . Alcohol use: Not Currently    Alcohol/week: 5.0 standard drinks    Types: 5 Cans of beer per week    Comment: per pt has not drank in a year since he found out he had cancer  - 01/04/21  . Drug use: No    Current Outpatient Medications  Medication Sig Dispense Refill  . acetaminophen (TYLENOL) 500 MG tablet Take 500 mg by mouth 2 (two) times daily as needed for moderate pain or headache.    Marland Kitchen amLODipine (NORVASC) 5 MG tablet Take 15 mg by mouth daily.    . Ascorbic Acid (VITAMIN C) 1000 MG tablet Take 1,000 mg by mouth daily.    Marland Kitchen aspirin EC 81 MG tablet Take 81 mg by mouth daily. Swallow whole.    . Coenzyme Q10 (COQ10) 100 MG CAPS Take 100 mg by mouth daily.    Marland Kitchen HAWTHORNE BERRY PO Take 1 tablet by mouth daily.    Marland Kitchen HYDROcodone-homatropine (HYCODAN) 5-1.5 MG/5ML syrup Take 5 mLs by mouth every 6 (six) hours as needed for cough. 120 mL 0  . irbesartan (AVAPRO) 300 MG tablet Take 300 mg by mouth daily.    Marland Kitchen latanoprost (XALATAN) 0.005 % ophthalmic solution Place 1 drop into both eyes at bedtime.    . Menthol, Topical Analgesic, (BIOFREEZE EX) Apply 1 application topically daily as needed (pain).    Marland Kitchen OVER THE COUNTER MEDICATION Take 1 tablet by mouth 3 (three) times daily. Jointprin otc supplement    . potassium chloride SA (KLOR-CON) 20 MEQ tablet Take 1 tablet (20 mEq total) by mouth daily. 7 tablet 0  . QUEtiapine (SEROQUEL) 50 MG tablet Take 50 mg by mouth at bedtime.    . Red Yeast Rice Extract (RED YEAST RICE PO) Take 2 tablets by mouth daily.    . sertraline (ZOLOFT) 100 MG tablet Take 100 mg by mouth daily.    . TURMERIC PO Take 1 tablet by mouth daily.    . vitamin B-12 (CYANOCOBALAMIN) 1000 MCG tablet Take 1,000 mcg by mouth daily.     No current facility-administered medications for this visit.    Allergies  Allergen Reactions  . Oxycodone Hcl Other (See Comments)    Pt reported "seeing things" and " feeling unusual."  . Bupropion Nausea And Vomiting    Review of Systems  Constitutional: Positive for activity change, appetite change and unexpected weight change.  HENT: Negative for trouble swallowing and voice change.   Respiratory: Positive  for cough (Hemoptysis) and wheezing. Negative for shortness of breath.   Cardiovascular: Negative for chest pain, palpitations and leg swelling.  Gastrointestinal: Negative for abdominal distention and abdominal pain.  Genitourinary: Negative for difficulty urinating and dysuria.  Musculoskeletal: Positive for myalgias.  Neurological: Negative for seizures and syncope.  Hematological: Negative for adenopathy. Does not bruise/bleed easily.  Psychiatric/Behavioral:  Positive for dysphoric mood. The patient is nervous/anxious.   All other systems reviewed and are negative.   BP (!) 144/82 (BP Location: Left Arm, Patient Position: Sitting)   Pulse 99   Resp 20   Ht 5\' 10"  (1.778 m)   Wt 187 lb (84.8 kg)   SpO2 96% Comment: RA  BMI 26.83 kg/m  Physical Exam Vitals reviewed.  Constitutional:      General: He is not in acute distress.    Appearance: Normal appearance.  HENT:     Head: Normocephalic and atraumatic.  Eyes:     General: No scleral icterus.    Extraocular Movements: Extraocular movements intact.  Cardiovascular:     Rate and Rhythm: Normal rate and regular rhythm.     Heart sounds: No murmur heard. No friction rub. No gallop.   Pulmonary:     Effort: Pulmonary effort is normal. No respiratory distress.     Breath sounds: Normal breath sounds. No wheezing or rales.  Abdominal:     General: There is no distension.     Palpations: Abdomen is soft.     Tenderness: There is no abdominal tenderness.  Musculoskeletal:     Cervical back: Neck supple.  Lymphadenopathy:     Cervical: No cervical adenopathy.  Skin:    General: Skin is warm and dry.  Neurological:     General: No focal deficit present.     Mental Status: He is alert and oriented to person, place, and time.     Cranial Nerves: No cranial nerve deficit.     Motor: No weakness.    Diagnostic Tests: CT CHEST WITH CONTRAST  TECHNIQUE: Multidetector CT imaging of the chest was performed  during intravenous contrast administration. Sagittal and coronal MPR images reconstructed from axial data set.  CONTRAST:  90mL ISOVUE-300 IOPAMIDOL (ISOVUE-300) INJECTION 61% IV  COMPARISON:  Chest radiograph 12/04/2020  FINDINGS: Cardiovascular: Atherosclerotic calcifications of coronary arteries. Aorta normal caliber. Vascular structures patent. Heart unremarkable. No pericardial effusion.  Mediastinum/Nodes: Esophagus unremarkable. Base of cervical region normal appearance. Scattered normal size mediastinal lymph nodes, largest 10 mm RIGHT paratracheal image 49 and 7 mm RIGHT paratracheal image 44. Probable 9 mm RIGHT hilar node image 55.  Lungs/Pleura: Large RIGHT upper lobe mass likely representing a pulmonary malignancy, 7.5 x 6.4 x 6.4 cm. Mass abuts the pleural surface in portions without definite chest wall invasion or bone erosion. Mild infiltrate adjacent to mass in RIGHT upper lobe. Remaining lungs clear. No additional infiltrate, pleural effusion, pneumothorax, or additional mass/nodule.  Upper Abdomen: Adrenal glands unremarkable. Remaining visualized upper abdomen unremarkable.  Musculoskeletal: No acute osseous abnormalities.  IMPRESSION: Large RIGHT upper lobe mass 7.5 x 6.4 x 6.4 cm likely representing a pulmonary neoplasm.  Mass abuts the pleural surface without definite chest wall invasion.  Mild surrounding infiltrate within RIGHT upper lobe.  No evidence of metastasis.  Scattered normal sized mediastinal and RIGHT hilar lymph nodes.  Coronary arterial calcification.  Aortic Atherosclerosis (ICD10-I70.0).   Electronically Signed   By: Lavonia Dana M.D.   On: 12/07/2020 13:18 NUCLEAR MEDICINE PET SKULL BASE TO THIGH  TECHNIQUE: 9.5 mCi F-18 FDG was injected intravenously. Full-ring PET imaging was performed from the skull base to thigh after the radiotracer. CT data was obtained and used for attenuation correction and  anatomic localization.  Fasting blood glucose: 138 mg/dl  COMPARISON:  Chest CT 12/07/2020  FINDINGS: Mediastinal blood pool activity: SUV max 2.3  Liver activity: SUV max NA  NECK:  Marked focal hypermetabolism is identified in the anterior midline mouth, just posterior to the incisors with SUV max = 11.5. No bony or soft tissue lesion evident in this region on CT imaging although artifact from dental fillings somewhat obscures this region.  Incidental CT findings: none  CHEST: Large right apical chest mass is markedly hypermetabolic with SUV max = 31. Central photopenia is consistent with necrosis.  No evidence for hypermetabolic lymphadenopathy in the right hilum or mediastinum. Upper normal right paratracheal node measuring 9 mm short axis on image 65/9 demonstrates SUV max = 2.0.  No other findings to suggest hypermetabolic metastases in the thorax.  Incidental CT findings: Coronary artery calcification is evident. Atherosclerotic calcification is noted in the wall of the thoracic aorta.  ABDOMEN/PELVIS: No abnormal hypermetabolic activity within the liver, pancreas, adrenal glands, or spleen. No hypermetabolic lymph nodes in the abdomen or pelvis.  Incidental CT findings: Diffuse low attenuation of liver parenchyma compatible with fatty deposition. There is abdominal aortic atherosclerosis without aneurysm.  SKELETON: No focal hypermetabolic activity to suggest skeletal metastasis.  Incidental CT findings: none  IMPRESSION: 1. Markedly hypermetabolic right upper lobe pulmonary mass consistent with primary bronchogenic neoplasm. No definite findings of hypermetabolic metastatic disease on today's study. Somewhat prominent upper normal sized right paratracheal node shows only low level FDG accumulation (below blood pool). 2. Indeterminate focal hypermetabolism in the anterior mouth which may be related to the tip of the tongue, anterior palate  or anterior floor mouth as it projects just posterior to the incisors. This region should be amenable to clinical inspection. 3. Hepatic steatosis. 4.  Aortic Atherosclerois (ICD10-170.0)   Electronically Signed   By: Misty Stanley M.D.   On: 12/14/2020 09:04 I personally reviewed the CT and PET/CT images.  There is a 7 cm mass that is markedly hypermetabolic on PET/CT.  No evidence of regional or distant metastatic disease.  Impression: Christopher Carter is a 60 year old man with past medical history significant for hypertension, anxiety, depression, and small bowel obstruction.  He is a lifelong non-smoker.  He presented with weight loss, malaise, cough, hemoptysis, and wheezing.  He was found to have a 7 cm right upper lobe mass.  Biopsy showed non-small cell carcinoma.  Neither PET nor MRI of the brain showed any evidence of regional or distant metastases.  He has clinical stage IIIa disease (T4, N0, M0).  Is unclear from the scans whether he has any chest wall invasion.  He does not have classic symptoms of chest wall invasion.  I discussed with Mr. and Mrs. Zuercher that with advanced stage disease he will need multimodality therapy.  The question is the best order of treatment.  Given that this appears to be resectable, I would favor proceeding with surgical resection first as that is the most important factor.  With the tumor that size I do not think is going to shrink enough that we would be able to do it minimally invasively if he was treated with neoadjuvant therapy.  Recommended that we proceed with right upper lobectomy.  We would do this with a VATS/thoracotomy approach.  The mass is too large to do completely minimally invasively.  We will place a scope initially just to make sure there are no findings that would preclude [place] proceeding with resection.  I informed Mr. and Mrs. Glance of the general nature of the procedure including the need for general anesthesia, the incisions  to be used, the use of a drainage tube postoperatively, the expected  hospital stay, and the overall recovery.  I informed them of the indications, risks, benefits, and alternatives.  They understand the risks include, but are not limited to death, MI, DVT, PE, bleeding, possible need for transfusion, infection, prolonged air leak, cardiac arrhythmias, as well as the possibility of other unforeseeable complications.  He understands and accepts the risks and wishes to proceed.  He does need pulmonary function testing to ensure adequate pulmonary reserve.  Based on his history I do not think there will be any issue with that.  Coronary calcification on CT-asymptomatic.  Would likely benefit from a statin in the long run.  Hypertension-blood pressure minimally elevated in the setting is of concern.  Overall good control current regimen.  Plan: Pulmonary function testing with and without bronchodilators Right VATS, thoracotomy, right upper lobectomy on Monday, 01/22/2021.  [name], MD [facility] [phone]

## 2021-01-15 NOTE — Progress Notes (Signed)
PCP is [name], MD Referring Provider is Curt Bears, MD  Chief Complaint  Patient presents with  . 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  Christopher Carter is a 60 year old non-smoker with a past medical history significant for hypertension and depression.  He has been feeling poorly since the first of the year.  He has had a cough that just would not go away.  He tested negative for COVID a couple of times.  His wife finally talked him into going to see the doctor and chest x-ray showed a right upper lobe mass.  He had a CT on 12/07/2020 which showed a 7.5 x 6.4 x 6.4 cm mass in the right upper lobe.  This abutted the pleural surface apically but there was no definite invasion.  PET/CT showed the mass was hypermetabolic.  There was no evidence of regional or distant metastatic disease.  He was referred to Dr. Earlie Server and then Dr. Lamonte Sakai.  Dr. Lamonte Sakai did a bronchoscopy and endobronchial ultrasound.  The mass was positive for non-small cell carcinoma.  He has a loss of appetite and has lost about 25 pounds over the past 3 months.  He has been wheezing and coughing up blood.  He does complain of some muscular pain in the right shoulder but thinks it is because been sleeping in an unusual position.  He does not have any burning or tingling pain.  He used to work on a pretty regular basis and frequently was [employer].  He has not been working out since the first of the year as he has not been feeling well.  He does complain of decreased energy.  Zubrod Score: At the time of surgery this patient's most appropriate activity status/level should be described as: []     0    Normal activity, no symptoms [x]     1    Restricted in physical strenuous activity but ambulatory, able to do out light work []     2    Ambulatory and  capable of self care, unable to do work activities, up and about >50 % of waking hours                              []     3    Only limited self care, in bed greater than 50% of waking hours []     4    Completely disabled, no self care, confined to bed or chair []     5    Moribund  Past Medical History:  Diagnosis Date  . Hypertension     Past Surgical History:  Procedure Laterality Date  . BRONCHIAL BIOPSY  01/05/2021   Procedure: BRONCHIAL BIOPSIES;  Surgeon: Collene Gobble, MD;  Location: [facility] ENDOSCOPY;  Service: Cardiopulmonary;;  . BRONCHIAL BRUSHINGS  01/05/2021   Procedure: BRONCHIAL BRUSHINGS;  Surgeon: Collene Gobble, MD;  Location: [place];  Service: Cardiopulmonary;;  . BRONCHIAL NEEDLE ASPIRATION BIOPSY  01/05/2021   Procedure: BRONCHIAL NEEDLE ASPIRATION BIOPSIES;  Surgeon: Collene Gobble, MD;  Location: [facility] ENDOSCOPY;  Service: Cardiopulmonary;;  . COLON SURGERY  2017   bowel obstruction surgery per pt   . ENDOBRONCHIAL ULTRASOUND N/A 01/05/2021   Procedure: ENDOBRONCHIAL ULTRASOUND;  Surgeon: Collene Gobble, MD;  Location:  [place] ENDOSCOPY;  Service: Cardiopulmonary;  Laterality: N/A;  . LAPAROSCOPY N/A 04/25/2017   Procedure: LAPAROSCOPY ASSISTED ILEO-CECECTOMY WITH SMALL BOWEL RESECTION WITH ANASTOMOSIS;  Surgeon: Excell Seltzer, MD;  Location: [facility] ORS;  Service: General;  Laterality: N/A;  . VIDEO BRONCHOSCOPY N/A 01/05/2021   Procedure: VIDEO BRONCHOSCOPY WITH FLUORO;  Surgeon: Collene Gobble, MD;  Location: [place];  Service: Cardiopulmonary;  Laterality: N/A;    History reviewed. No pertinent family history.  Social History Social History   Tobacco Use  . Smoking status: Never Smoker  . Smokeless tobacco: Never Used  Vaping Use  . Vaping Use: Never used  Substance Use Topics  . Alcohol use: Not Currently    Alcohol/week: 5.0 standard drinks    Types: 5 Cans of beer per week    Comment: per pt has not drank in a year since he found out he had cancer  - 01/04/21  . Drug use: No    Current Outpatient Medications  Medication Sig Dispense Refill  . acetaminophen (TYLENOL) 500 MG tablet Take 500 mg by mouth 2 (two) times daily as needed for moderate pain or headache.    Marland Kitchen amLODipine (NORVASC) 5 MG tablet Take 15 mg by mouth daily.    . Ascorbic Acid (VITAMIN C) 1000 MG tablet Take 1,000 mg by mouth daily.    Marland Kitchen aspirin EC 81 MG tablet Take 81 mg by mouth daily. Swallow whole.    . Coenzyme Q10 (COQ10) 100 MG CAPS Take 100 mg by mouth daily.    Marland Kitchen HAWTHORNE BERRY PO Take 1 tablet by mouth daily.    Marland Kitchen HYDROcodone-homatropine (HYCODAN) 5-1.5 MG/5ML syrup Take 5 mLs by mouth every 6 (six) hours as needed for cough. 120 mL 0  . irbesartan (AVAPRO) 300 MG tablet Take 300 mg by mouth daily.    Marland Kitchen latanoprost (XALATAN) 0.005 % ophthalmic solution Place 1 drop into both eyes at bedtime.    . Menthol, Topical Analgesic, (BIOFREEZE EX) Apply 1 application topically daily as needed (pain).    Marland Kitchen OVER THE COUNTER MEDICATION Take 1 tablet by mouth 3 (three) times daily. Jointprin otc supplement    . potassium chloride SA (KLOR-CON) 20 MEQ tablet Take 1 tablet (20 mEq total) by mouth daily. 7 tablet 0  . QUEtiapine (SEROQUEL) 50 MG tablet Take 50 mg by mouth at bedtime.    . Red Yeast Rice Extract (RED YEAST RICE PO) Take 2 tablets by mouth daily.    . sertraline (ZOLOFT) 100 MG tablet Take 100 mg by mouth daily.    . TURMERIC PO Take 1 tablet by mouth daily.    . vitamin B-12 (CYANOCOBALAMIN) 1000 MCG tablet Take 1,000 mcg by mouth daily.     No current facility-administered medications for this visit.    Allergies  Allergen Reactions  . Oxycodone Hcl Other (See Comments)    Pt reported "seeing things" and " feeling unusual."  . Bupropion Nausea And Vomiting    Review of Systems  Constitutional: Positive for activity change, appetite change and unexpected weight change.  HENT: Negative for trouble swallowing and voice change.   Respiratory: Positive  for cough (Hemoptysis) and wheezing. Negative for shortness of breath.   Cardiovascular: Negative for chest pain, palpitations and leg swelling.  Gastrointestinal: Negative for abdominal distention and abdominal pain.  Genitourinary: Negative for difficulty urinating and dysuria.  Musculoskeletal: Positive for myalgias.  Neurological: Negative for seizures and syncope.  Hematological: Negative for adenopathy. Does not bruise/bleed easily.  Psychiatric/Behavioral:  Positive for dysphoric mood. The patient is nervous/anxious.   All other systems reviewed and are negative.   BP (!) 144/82 (BP Location: Left Arm, Patient Position: Sitting)   Pulse 99   Resp 20   Ht 5\' 10"  (1.778 m)   Wt 187 lb (84.8 kg)   SpO2 96% Comment: RA  BMI 26.83 kg/m  Physical Exam Vitals reviewed.  Constitutional:      General: He is not in acute distress.    Appearance: Normal appearance.  HENT:     Head: Normocephalic and atraumatic.  Eyes:     General: No scleral icterus.    Extraocular Movements: Extraocular movements intact.  Cardiovascular:     Rate and Rhythm: Normal rate and regular rhythm.     Heart sounds: No murmur heard. No friction rub. No gallop.   Pulmonary:     Effort: Pulmonary effort is normal. No respiratory distress.     Breath sounds: Normal breath sounds. No wheezing or rales.  Abdominal:     General: There is no distension.     Palpations: Abdomen is soft.     Tenderness: There is no abdominal tenderness.  Musculoskeletal:     Cervical back: Neck supple.  Lymphadenopathy:     Cervical: No cervical adenopathy.  Skin:    General: Skin is warm and dry.  Neurological:     General: No focal deficit present.     Mental Status: He is alert and oriented to person, place, and time.     Cranial Nerves: No cranial nerve deficit.     Motor: No weakness.    Diagnostic Tests: CT CHEST WITH CONTRAST  TECHNIQUE: Multidetector CT imaging of the chest was performed  during intravenous contrast administration. Sagittal and coronal MPR images reconstructed from axial data set.  CONTRAST:  33mL ISOVUE-300 IOPAMIDOL (ISOVUE-300) INJECTION 61% IV  COMPARISON:  Chest radiograph 12/04/2020  FINDINGS: Cardiovascular: Atherosclerotic calcifications of coronary arteries. Aorta normal caliber. Vascular structures patent. Heart unremarkable. No pericardial effusion.  Mediastinum/Nodes: Esophagus unremarkable. Base of cervical region normal appearance. Scattered normal size mediastinal lymph nodes, largest 10 mm RIGHT paratracheal image 49 and 7 mm RIGHT paratracheal image 44. Probable 9 mm RIGHT hilar node image 55.  Lungs/Pleura: Large RIGHT upper lobe mass likely representing a pulmonary malignancy, 7.5 x 6.4 x 6.4 cm. Mass abuts the pleural surface in portions without definite chest wall invasion or bone erosion. Mild infiltrate adjacent to mass in RIGHT upper lobe. Remaining lungs clear. No additional infiltrate, pleural effusion, pneumothorax, or additional mass/nodule.  Upper Abdomen: Adrenal glands unremarkable. Remaining visualized upper abdomen unremarkable.  Musculoskeletal: No acute osseous abnormalities.  IMPRESSION: Large RIGHT upper lobe mass 7.5 x 6.4 x 6.4 cm likely representing a pulmonary neoplasm.  Mass abuts the pleural surface without definite chest wall invasion.  Mild surrounding infiltrate within RIGHT upper lobe.  No evidence of metastasis.  Scattered normal sized mediastinal and RIGHT hilar lymph nodes.  Coronary arterial calcification.  Aortic Atherosclerosis (ICD10-I70.0).   Electronically Signed   By: Lavonia Dana M.D.   On: 12/07/2020 13:18 NUCLEAR MEDICINE PET SKULL BASE TO THIGH  TECHNIQUE: 9.5 mCi F-18 FDG was injected intravenously. Full-ring PET imaging was performed from the skull base to thigh after the radiotracer. CT data was obtained and used for attenuation correction and  anatomic localization.  Fasting blood glucose: 138 mg/dl  COMPARISON:  Chest CT 12/07/2020  FINDINGS: Mediastinal blood pool activity: SUV max 2.3  Liver activity: SUV max NA  NECK:  Marked focal hypermetabolism is identified in the anterior midline mouth, just posterior to the incisors with SUV max = 11.5. No bony or soft tissue lesion evident in this region on CT imaging although artifact from dental fillings somewhat obscures this region.  Incidental CT findings: none  CHEST: Large right apical chest mass is markedly hypermetabolic with SUV max = 31. Central photopenia is consistent with necrosis.  No evidence for hypermetabolic lymphadenopathy in the right hilum or mediastinum. Upper normal right paratracheal node measuring 9 mm short axis on image 65/9 demonstrates SUV max = 2.0.  No other findings to suggest hypermetabolic metastases in the thorax.  Incidental CT findings: Coronary artery calcification is evident. Atherosclerotic calcification is noted in the wall of the thoracic aorta.  ABDOMEN/PELVIS: No abnormal hypermetabolic activity within the liver, pancreas, adrenal glands, or spleen. No hypermetabolic lymph nodes in the abdomen or pelvis.  Incidental CT findings: Diffuse low attenuation of liver parenchyma compatible with fatty deposition. There is abdominal aortic atherosclerosis without aneurysm.  SKELETON: No focal hypermetabolic activity to suggest skeletal metastasis.  Incidental CT findings: none  IMPRESSION: 1. Markedly hypermetabolic right upper lobe pulmonary mass consistent with primary bronchogenic neoplasm. No definite findings of hypermetabolic metastatic disease on today's study. Somewhat prominent upper normal sized right paratracheal node shows only low level FDG accumulation (below blood pool). 2. Indeterminate focal hypermetabolism in the anterior mouth which may be related to the tip of the tongue, anterior palate  or anterior floor mouth as it projects just posterior to the incisors. This region should be amenable to clinical inspection. 3. Hepatic steatosis. 4.  Aortic Atherosclerois (ICD10-170.0)   Electronically Signed   By: Misty Stanley M.D.   On: 12/14/2020 09:04 I personally reviewed the CT and PET/CT images.  There is a 7 cm mass that is markedly hypermetabolic on PET/CT.  No evidence of regional or distant metastatic disease.  Impression: Derryck Shahan is a 60 year old man with past medical history significant for hypertension, anxiety, depression, and small bowel obstruction.  He is a lifelong non-smoker.  He presented with weight loss, malaise, cough, hemoptysis, and wheezing.  He was found to have a 7 cm right upper lobe mass.  Biopsy showed non-small cell carcinoma.  Neither PET nor MRI of the brain showed any evidence of regional or distant metastases.  He has clinical stage IIIa disease (T4, N0, M0).  Is unclear from the scans whether he has any chest wall invasion.  He does not have classic symptoms of chest wall invasion.  I discussed with Mr. and Mrs. Mcferran that with advanced stage disease he will need multimodality therapy.  The question is the best order of treatment.  Given that this appears to be resectable, I would favor proceeding with surgical resection first as that is the most important factor.  With the tumor that size I do not think is going to shrink enough that we would be able to do it minimally invasively if he was treated with neoadjuvant therapy.  Recommended that we proceed with right upper lobectomy.  We would do this with a VATS/thoracotomy approach.  The mass is too large to do completely minimally invasively.  We will place a scope initially just to make sure there are no findings that would preclude [place] proceeding with resection.  I informed Mr. and Mrs. Sheaffer of the general nature of the procedure including the need for general anesthesia, the incisions  to be used, the use of a drainage tube postoperatively, the expected  hospital stay, and the overall recovery.  I informed them of the indications, risks, benefits, and alternatives.  They understand the risks include, but are not limited to death, MI, DVT, PE, bleeding, possible need for transfusion, infection, prolonged air leak, cardiac arrhythmias, as well as the possibility of other unforeseeable complications.  He understands and accepts the risks and wishes to proceed.  He does need pulmonary function testing to ensure adequate pulmonary reserve.  Based on his history I do not think there will be any issue with that.  Coronary calcification on CT-asymptomatic.  Would likely benefit from a statin in the long run.  Hypertension-blood pressure minimally elevated in the setting is of concern.  Overall good control current regimen.  Plan: Pulmonary function testing with and without bronchodilators Right VATS, thoracotomy, right upper lobectomy on Monday, 01/22/2021.  [name], MD [facility] [phone]

## 2021-01-17 NOTE — Progress Notes (Signed)
Surgical Instructions    Your procedure is scheduled on May 16  Report to [facility] Main Entrance "A" at 0530 A.M., then check in with the Admitting office.  Call this number if you have problems the morning of surgery:  [phone]   If you have any questions prior to your surgery date call [phone]: Open Monday-Friday 8am-4pm    Remember:  Do not eat or drink after midnight the night before your surgery     Take these medicines the morning of surgery with A SIP OF WATER  acetaminophen (TYLENOL)  amLODipine (NORVASC) sertraline (ZOLOFT)  Follow your surgeon's instructions on when to stop Aspirin.  If no instructions were given by your surgeon then you will need to call the office to get those instructions.    As of today, STOP taking any Aspirin (unless otherwise instructed by your surgeon) Aleve, Naproxen, Ibuprofen, Motrin, Advil, Goody's, BC's, all herbal medications, fish oil, and all vitamins.                     Do not wear jewelry            Do not wear lotions, powders, colognes, or deodorant.            Men may shave face and neck.            Do not bring valuables to the hospital.            [facility] is not responsible for any belongings or valuables.  Do NOT Smoke (Tobacco/Vaping) or drink Alcohol 24 hours prior to your procedure If you use a CPAP at night, you may bring all equipment for your overnight stay.   Contacts, glasses, dentures or bridgework may not be worn into surgery, please bring cases for these belongings   For patients admitted to the hospital, discharge time will be determined by your treatment team.   Patients discharged the day of surgery will not be allowed to drive home, and someone needs to stay with them for 24 hours.    Special instructions:   [place]- Preparing For Surgery  Before surgery, you can play an important role. Because skin is not sterile, your skin needs to be as free of germs as possible. You can reduce the  number of germs on your skin by washing with CHG (chlorahexidine gluconate) Soap before surgery.  CHG is an antiseptic cleaner which kills germs and bonds with the skin to continue killing germs even after washing.    Oral Hygiene is also important to reduce your risk of infection.  Remember - BRUSH YOUR TEETH THE MORNING OF SURGERY WITH YOUR REGULAR TOOTHPASTE  Please do not use if you have an allergy to CHG or antibacterial soaps. If your skin becomes reddened/irritated stop using the CHG.  Do not shave (including legs and underarms) for at least 48 hours prior to first CHG shower. It is OK to shave your face.  Please follow these instructions carefully.   1. Shower the NIGHT BEFORE SURGERY and the MORNING OF SURGERY  2. If you chose to wash your hair, wash your hair first as usual with your normal shampoo.  3. After you shampoo, rinse your hair and body thoroughly to remove the shampoo.  4. Wash Face and genitals (private parts) with your normal soap.   5.  Shower the NIGHT BEFORE SURGERY and the MORNING OF SURGERY with CHG Soap.   6. Use CHG Soap as you would  any other liquid soap. You can apply CHG directly to the skin and wash gently with a scrungie or a clean washcloth.   7. Apply the CHG Soap to your body ONLY FROM THE NECK DOWN.  Do not use on open wounds or open sores. Avoid contact with your eyes, ears, mouth and genitals (private parts). Wash Face and genitals (private parts)  with your normal soap.   8. Wash thoroughly, paying special attention to the area where your surgery will be performed.  9. Thoroughly rinse your body with warm water from the neck down.  10. DO NOT shower/wash with your normal soap after using and rinsing off the CHG Soap.  11. Pat yourself dry with a CLEAN TOWEL.  12. Wear CLEAN PAJAMAS to bed the night before surgery  13. Place CLEAN SHEETS on your bed the night before your surgery  14. DO NOT SLEEP WITH PETS.   Day of Surgery: Take a  shower with CHG soap. Wear Clean/Comfortable clothing the morning of surgery Do not apply any deodorants/lotions.   Remember to brush your teeth WITH YOUR REGULAR TOOTHPASTE.   Please read over the following fact sheets that you were given.

## 2021-01-18 ENCOUNTER — Other Ambulatory Visit: Payer: Self-pay | Admitting: *Deleted

## 2021-01-18 ENCOUNTER — Other Ambulatory Visit: Payer: Self-pay

## 2021-01-18 ENCOUNTER — Encounter (HOSPITAL_COMMUNITY)
Admission: RE | Admit: 2021-01-18 | Discharge: 2021-01-18 | Disposition: A | Discharge status: Home / Self Care | Payer: 59 | Source: Ambulatory Visit | Attending: Thoracic Surgery (Cardiothoracic Vascular Surgery) | Admitting: Thoracic Surgery (Cardiothoracic Vascular Surgery)

## 2021-01-18 ENCOUNTER — Encounter (HOSPITAL_COMMUNITY): Payer: Self-pay

## 2021-01-18 DIAGNOSIS — I10 Essential (primary) hypertension: Secondary | ICD-10-CM | POA: Diagnosis not present

## 2021-01-18 DIAGNOSIS — Z7982 Long term (current) use of aspirin: Secondary | ICD-10-CM | POA: Diagnosis not present

## 2021-01-18 DIAGNOSIS — Z01812 Encounter for preprocedural laboratory examination: Secondary | ICD-10-CM | POA: Insufficient documentation

## 2021-01-18 DIAGNOSIS — Z8616 Personal history of COVID-19: Secondary | ICD-10-CM | POA: Insufficient documentation

## 2021-01-18 DIAGNOSIS — Z79899 Other long term (current) drug therapy: Secondary | ICD-10-CM | POA: Insufficient documentation

## 2021-01-18 DIAGNOSIS — C3411 Malignant neoplasm of upper lobe, right bronchus or lung: Secondary | ICD-10-CM

## 2021-01-18 DIAGNOSIS — Z20822 Contact with and (suspected) exposure to covid-19: Secondary | ICD-10-CM | POA: Insufficient documentation

## 2021-01-18 HISTORY — DX: Pneumonia, unspecified organism: J18.9

## 2021-01-18 HISTORY — DX: Anxiety disorder, unspecified: F41.9

## 2021-01-18 HISTORY — DX: Depression, unspecified: F32.A

## 2021-01-18 LAB — URINALYSIS, ROUTINE W REFLEX MICROSCOPIC
Bacteria, UA: NONE SEEN
Bilirubin Urine: NEGATIVE
Glucose, UA: NEGATIVE mg/dL
Ketones, ur: NEGATIVE mg/dL
Leukocytes,Ua: NEGATIVE
Nitrite: NEGATIVE
Protein, ur: NEGATIVE mg/dL
Specific Gravity, Urine: 1.006 (ref 1.005–1.030)
pH: 6 (ref 5.0–8.0)

## 2021-01-18 LAB — BLOOD GAS, ARTERIAL
Acid-base deficit: 1.3 mmol/L (ref 0.0–2.0)
Bicarbonate: 22.1 mmol/L (ref 20.0–28.0)
Drawn by: 58793
FIO2: 21
O2 Saturation: 97.5 %
Patient temperature: 37
pCO2 arterial: 31.8 mmHg — ABNORMAL LOW (ref 32.0–48.0)
pH, Arterial: 7.455 — ABNORMAL HIGH (ref 7.350–7.450)
pO2, Arterial: 96.3 mmHg (ref 83.0–108.0)

## 2021-01-18 LAB — TYPE AND SCREEN
ABO/RH(D): O POS
Antibody Screen: NEGATIVE

## 2021-01-18 LAB — CBC
HCT: 41.3 % (ref 39.0–52.0)
Hemoglobin: 13.5 g/dL (ref 13.0–17.0)
MCH: 29.7 pg (ref 26.0–34.0)
MCHC: 32.7 g/dL (ref 30.0–36.0)
MCV: 91 fL (ref 80.0–100.0)
Platelets: 450 10*3/uL — ABNORMAL HIGH (ref 150–400)
RBC: 4.54 MIL/uL (ref 4.22–5.81)
RDW: 14.1 % (ref 11.5–15.5)
WBC: 12.4 10*3/uL — ABNORMAL HIGH (ref 4.0–10.5)
nRBC: 0 % (ref 0.0–0.2)

## 2021-01-18 LAB — COMPREHENSIVE METABOLIC PANEL
ALT: 22 U/L (ref 0–44)
AST: 18 U/L (ref 15–41)
Albumin: 3.3 g/dL — ABNORMAL LOW (ref 3.5–5.0)
Alkaline Phosphatase: 90 U/L (ref 38–126)
Anion gap: 15 (ref 5–15)
BUN: 6 mg/dL (ref 6–20)
CO2: 21 mmol/L — ABNORMAL LOW (ref 22–32)
Calcium: 8.9 mg/dL (ref 8.9–10.3)
Chloride: 102 mmol/L (ref 98–111)
Creatinine, Ser: 0.52 mg/dL — ABNORMAL LOW (ref 0.61–1.24)
GFR, Estimated: >60 mL/min (ref >60)
Glucose, Bld: 170 mg/dL — ABNORMAL HIGH (ref 70–99)
Potassium: 2.9 mmol/L — ABNORMAL LOW (ref 3.5–5.1)
Sodium: 138 mmol/L (ref 135–145)
Total Bilirubin: 0.5 mg/dL (ref 0.3–1.2)
Total Protein: 7.1 g/dL (ref 6.5–8.1)

## 2021-01-18 LAB — SURGICAL PCR SCREEN
MRSA, PCR: NEGATIVE
Staphylococcus aureus: NEGATIVE

## 2021-01-18 LAB — SARS CORONAVIRUS 2 (TAT 6-24 HRS): SARS Coronavirus 2: NEGATIVE

## 2021-01-18 LAB — APTT: aPTT: 28 seconds (ref 24–36)

## 2021-01-18 LAB — PROTIME-INR
INR: 1 (ref 0.8–1.2)
Prothrombin Time: 13.4 seconds (ref 11.4–15.2)

## 2021-01-18 MED ORDER — POTASSIUM CHLORIDE ER 20 MEQ PO TBCR: 10.0000 meq | EXTENDED_RELEASE_TABLET | Freq: Two times a day (BID) | ORAL | 0 refills | Status: DC

## 2021-01-18 NOTE — Progress Notes (Signed)
PCP - Dr. [name] Cardiologist - denies  Chest x-ray - DOS; ordered to be done within 72 hours of surgery.  EKG - 01/05/21 Stress Test - denies ECHO - denies Cardiac Cath - denies  Sleep Study - denies CPAP - denies  Blood Thinner Instructions: n/a Aspirin Instructions: stop 2 days prior to surgery. Pt states that he has not been taking the ASA  COVID TEST- 01/18/21; pending.   Anesthesia review: Yes  Patient denies shortness of breath, fever, cough and chest pain at PAT appointment   All instructions explained to the patient, with a verbal understanding of the material. Patient agrees to go over the instructions while at home for a better understanding. Patient also instructed to self quarantine after being tested for COVID-19. The opportunity to ask questions was provided.

## 2021-01-18 NOTE — Progress Notes (Signed)
Charlena Cross, RN with [facility] notified of pt's K+ level. MD will be made aware.   Jacqlyn Larsen, RN

## 2021-01-18 NOTE — Progress Notes (Signed)
Patient's wife called and made aware of medication Potassium 6mEq BID x3 days before surgery Monday per Dr. Roxan Hockey. Patient's potassium 2.9. She acknowledged receipt. Prescription sent into preferred pharmacy [facility] on [address].

## 2021-01-18 NOTE — Progress Notes (Signed)
The proposed treatment discussed in cancer conference is for discussion purpose only and is not a binding recommendation. The patient was not physically examined nor present for their treatment options. Therefore, final treatment plans cannot be decided.  ?

## 2021-01-19 ENCOUNTER — Ambulatory Visit (HOSPITAL_COMMUNITY)
Admission: RE | Admit: 2021-01-19 | Discharge: 2021-01-19 | Disposition: A | Discharge status: Home / Self Care | Payer: 59 | Source: Ambulatory Visit | Attending: Thoracic Surgery (Cardiothoracic Vascular Surgery) | Admitting: Thoracic Surgery (Cardiothoracic Vascular Surgery)

## 2021-01-19 ENCOUNTER — Other Ambulatory Visit: Payer: Self-pay | Admitting: *Deleted

## 2021-01-19 DIAGNOSIS — R918 Other nonspecific abnormal finding of lung field: Secondary | ICD-10-CM | POA: Insufficient documentation

## 2021-01-19 LAB — PULMONARY FUNCTION TEST
DL/VA % pred: 104 %
DL/VA: 4.45 ml/min/mmHg/L
DLCO cor % pred: 81 %
DLCO cor: 22.67 ml/min/mmHg
DLCO unc % pred: 78 %
DLCO unc: 21.94 ml/min/mmHg
FEF 25-75 Pre: 1.59 L/sec
FEF2575-%Pred-Pre: 51 %
FEV1-%Pred-Pre: 64 %
FEV1-Pre: 2.35 L
FEV1FVC-%Pred-Pre: 94 %
FEV6-%Pred-Pre: 68 %
FEV6-Pre: 3.18 L
FEV6FVC-%Pred-Pre: 101 %
FVC-%Pred-Pre: 68 %
FVC-Pre: 3.29 L
Pre FEV1/FVC ratio: 72 %
Pre FEV6/FVC Ratio: 97 %
RV % pred: 86 %
RV: 1.9 L
TLC % pred: 77 %
TLC: 5.44 L

## 2021-01-19 MED ORDER — ALBUTEROL SULFATE (2.5 MG/3ML) 0.083% IN NEBU: 2.5000 mg | INHALATION_SOLUTION | Freq: Once | RESPIRATORY_TRACT | Status: DC

## 2021-01-19 NOTE — Progress Notes (Addendum)
Anesthesia Chart Review:  Case: [id number] Date/Time: 01/22/21 0730   Procedures:      VIDEO ASSISTED THORACOSCOPY (VATS)/RIGHT UPPER LOBECTOMY (Right Chest)     THORACOTOMY (Right )   Anesthesia type: General   Pre-op diagnosis: RUL ADENOCARCINOMA   Location: [facility] OR ROOM 10 / [place] [facility]   Surgeons: [name], MD      DISCUSSION: Patient is a 60 year old male scheduled for the above procedure. S/p video bronchoscopy with EBUS 01/05/21 for evaluation of RUL lung mass with mediastinal adenopathy (cytology: Malignant cells consistent with non-small cell carcinoma).   History includes never smoker, HTN, lung cancer (RUL mass, + non-small cell carcinoma 01/05/21), SBO due to chronically inflamed Meckel's diverticulum (s/p laparoscopic assisted ileocecectomy and small bowel resection with anastomoses 04/25/17). + COVID-19 ~ 12/20/20 (prescribed Paxlovid).   Surgeon called in KCL for hypokalemia, K 2.9. Will order an ISTAT for the day of surgery to re-check for improvement.   01/18/21 presurgical COVID-19 test negative. Anesthesia team to evaluate on the day of surgery. He also needs CXR on arrival per surgeon order. PFTs are scheduled for 01/19/21 at 3:00 PM.   VS: BP (!) 149/94   Pulse 99   Temp 36.9 C (Oral)   Resp 20   Ht 5\' 10"  (1.778 m)   Wt 84.8 kg   SpO2 100%   BMI 26.83 kg/m     PROVIDERS: [name], MD is PCP  Curt Bears, MD is HEM-ONC Baltazar Apo, MD is pulmonologist   LABS: Labs reviewed: Repeat ISTAT day of surgery to recheck K after supplementation. (all labs ordered are listed, but only abnormal results are displayed)  Labs Reviewed  CBC - Abnormal; Notable for the following components:      Result Value   WBC 12.4 (*)    Platelets 450 (*)    All other components within normal limits  COMPREHENSIVE METABOLIC PANEL - Abnormal; Notable for the following components:   Potassium 2.9 (*)    CO2 21 (*)    Glucose, Bld 170 (*)    Creatinine, Ser 0.52  (*)    Albumin 3.3 (*)    All other components within normal limits  BLOOD GAS, ARTERIAL - Abnormal; Notable for the following components:   pH, Arterial 7.455 (*)    pCO2 arterial 31.8 (*)    Allens test (pass/fail) BRACHIAL ARTERY (*)    All other components within normal limits  URINALYSIS, ROUTINE W REFLEX MICROSCOPIC - Abnormal; Notable for the following components:   Hgb urine dipstick SMALL (*)    All other components within normal limits  SURGICAL PCR SCREEN  SARS CORONAVIRUS 2 (TAT 6-24 HRS)  PROTIME-INR  APTT  TYPE AND SCREEN     IMAGES: CXR for Day of surgery.  PET Scan 12/13/20: IMPRESSION: 1. Markedly hypermetabolic right upper lobe pulmonary mass consistent with primary bronchogenic neoplasm. No definite findings of hypermetabolic metastatic disease on today's study. Somewhat prominent upper normal sized right paratracheal node shows only low level FDG accumulation (below blood pool). 2. Indeterminate focal hypermetabolism in the anterior mouth which may be related to the tip of the tongue, anterior palate or anterior floor mouth as it projects just posterior to the incisors. This region should be amenable to clinical inspection. 3. Hepatic steatosis. 4.  Aortic Atherosclerois (ICD10-170.0)  CT Chest 12/07/20: IMPRESSION: - Large RIGHT upper lobe mass 7.5 x 6.4 x 6.4 cm likely representing a pulmonary neoplasm. - Mass abuts the pleural surface without definite  chest wall invasion. - Mild surrounding infiltrate within RIGHT upper lobe. - No evidence of metastasis. - Scattered normal sized mediastinal and RIGHT hilar lymph nodes. - Coronary arterial calcification. - Aortic Atherosclerosis (ICD10-I70.0).   EKG: 01/05/21: NSR   CV: N/A  Past Medical History:  Diagnosis Date  . Anxiety   . Cancer ([facility]) 12/2020  . Depression   . Hypertension   . Pneumonia    1990's    Past Surgical History:  Procedure Laterality Date  . ANKLE FRACTURE SURGERY  Right   . BRONCHIAL BIOPSY  01/05/2021   Procedure: BRONCHIAL BIOPSIES;  Surgeon: Collene Gobble, MD;  Location: [facility] ENDOSCOPY;  Service: Cardiopulmonary;;  . BRONCHIAL BRUSHINGS  01/05/2021   Procedure: BRONCHIAL BRUSHINGS;  Surgeon: Collene Gobble, MD;  Location: [place];  Service: Cardiopulmonary;;  . BRONCHIAL NEEDLE ASPIRATION BIOPSY  01/05/2021   Procedure: BRONCHIAL NEEDLE ASPIRATION BIOPSIES;  Surgeon: Collene Gobble, MD;  Location: [facility] ENDOSCOPY;  Service: Cardiopulmonary;;  . COLON SURGERY  2017   bowel obstruction surgery per pt   . ENDOBRONCHIAL ULTRASOUND N/A 01/05/2021   Procedure: ENDOBRONCHIAL ULTRASOUND;  Surgeon: Collene Gobble, MD;  Location: [place];  Service: Cardiopulmonary;  Laterality: N/A;  . LAPAROSCOPY N/A 04/25/2017   Procedure: LAPAROSCOPY ASSISTED ILEO-CECECTOMY WITH SMALL BOWEL RESECTION WITH ANASTOMOSIS;  Surgeon: Excell Seltzer, MD;  Location: [facility] ORS;  Service: General;  Laterality: N/A;  . VIDEO BRONCHOSCOPY N/A 01/05/2021   Procedure: VIDEO BRONCHOSCOPY WITH FLUORO;  Surgeon: Collene Gobble, MD;  Location: [place];  Service: Cardiopulmonary;  Laterality: N/A;    MEDICATIONS: . acetaminophen (TYLENOL) 500 MG tablet  . amLODipine (NORVASC) 5 MG tablet  . Ascorbic Acid (VITAMIN C) 1000 MG tablet  . aspirin EC 81 MG tablet  . Coenzyme Q10 (COQ10) 100 MG CAPS  . HAWTHORNE BERRY PO  . HYDROcodone-homatropine (HYCODAN) 5-1.5 MG/5ML syrup  . irbesartan (AVAPRO) 300 MG tablet  . latanoprost (XALATAN) 0.005 % ophthalmic solution  . Menthol, Topical Analgesic, (BIOFREEZE EX)  . OVER THE COUNTER MEDICATION  . potassium chloride 20 MEQ TBCR  . potassium chloride SA (KLOR-CON) 20 MEQ tablet  . QUEtiapine (SEROQUEL) 50 MG tablet  . Red Yeast Rice Extract (RED YEAST RICE PO)  . sertraline (ZOLOFT) 100 MG tablet  . TURMERIC PO  . vitamin B-12 (CYANOCOBALAMIN) 1000 MCG tablet   No current facility-administered medications for this encounter.  He  says he has not been taking ASA.  Myra Gianotti, PA-C Surgical Short Stay/Anesthesiology [facility] Phone [phone] [facility] Phone [phone] 01/19/2021 10:07 AM

## 2021-01-19 NOTE — Anesthesia Preprocedure Evaluation (Addendum)
Anesthesia Evaluation  Patient identified by MRN, date of birth, ID band Patient awake    Reviewed: Allergy & Precautions, NPO status , Patient's Chart, lab work & pertinent test results  History of Anesthesia Complications Negative for: history of anesthetic complications  Airway Mallampati: II  TM Distance: >3 FB Neck ROM: Full    Dental  (+) Dental Advisory Given, Teeth Intact   Pulmonary  Covid-19 Nucleic Acid Test Results Lab Results      Component                Value               Date                      SARSCOV2NAA              NEGATIVE            01/18/2021             RUL ADENOCARCINOMA   breath sounds clear to auscultation       Cardiovascular hypertension, Pt. on medications (-) angina(-) Past MI and (-) CHF  Rhythm:Regular     Neuro/Psych PSYCHIATRIC DISORDERS Anxiety Depression negative neurological ROS     GI/Hepatic negative GI ROS, Neg liver ROS,   Endo/Other    Renal/GU negative Renal ROSLab Results      Component                Value               Date                      CREATININE               0.60 (L)            01/22/2021           Lab Results      Component                Value               Date                      K                        3.5                 01/22/2021                Musculoskeletal   Abdominal   Peds  Hematology  (+) Blood dyscrasia, anemia , Lab Results      Component                Value               Date                      WBC                      12.4 (H)            01/18/2021                HGB                      12.6 (  L)            01/22/2021                HCT                      37.0 (L)            01/22/2021                MCV                      91.0                01/18/2021                PLT                      450 (H)             01/18/2021           \   Anesthesia Other Findings  DISCUSSION: Patient is a 60 year old male scheduled for  the above procedure. S/p video bronchoscopy with EBUS 01/05/21 for evaluation of RUL lung mass with mediastinal adenopathy (cytology: Malignant cells consistent with non-small cell carcinoma).   History includes never smoker, HTN, lung cancer (RUL mass, + non-small cell carcinoma 01/05/21), SBO due to chronically inflamed Meckel's diverticulum (s/p laparoscopic assisted ileocecectomy and small bowel resection with anastomoses 04/25/17). + COVID-19 ~ 12/20/20 (prescribed Paxlovid).   Surgeon called in KCL for hypokalemia, K 2.9. Will order an ISTAT for the day of surgery to re-check for improvement.   Reproductive/Obstetrics                            Anesthesia Physical Anesthesia Plan  ASA: II  Anesthesia Plan: General   Post-op Pain Management:    Induction: Intravenous  PONV Risk Score and Plan: 2 and Ondansetron and Dexamethasone  Airway Management Planned: Double Lumen EBT  Additional Equipment: Arterial line  Intra-op Plan:   Post-operative Plan: Extubation in OR  Informed Consent: I have reviewed the patients History and Physical, chart, labs and discussed the procedure including the risks, benefits and alternatives for the proposed anesthesia with the patient or authorized representative who has indicated his/her understanding and acceptance.     Dental advisory given  Plan Discussed with: CRNA and Surgeon  Anesthesia Plan Comments: (PAT note written 01/19/2021 by Myra Gianotti, PA-C. )       Anesthesia Quick Evaluation

## 2021-01-22 ENCOUNTER — Inpatient Hospital Stay (HOSPITAL_COMMUNITY): Payer: 59

## 2021-01-22 ENCOUNTER — Inpatient Hospital Stay (HOSPITAL_COMMUNITY): Payer: 59 | Admitting: Vascular Surgery

## 2021-01-22 ENCOUNTER — Encounter (HOSPITAL_COMMUNITY): Payer: Self-pay | Admitting: Thoracic Surgery (Cardiothoracic Vascular Surgery)

## 2021-01-22 ENCOUNTER — Other Ambulatory Visit: Payer: Self-pay

## 2021-01-22 ENCOUNTER — Encounter (HOSPITAL_COMMUNITY)
Admission: RE | Disposition: A | Discharge status: Home / Self Care | Payer: Self-pay | Source: Home / Self Care | Attending: Thoracic Surgery (Cardiothoracic Vascular Surgery)

## 2021-01-22 ENCOUNTER — Inpatient Hospital Stay (HOSPITAL_COMMUNITY)
Admission: RE | Admit: 2021-01-22 | Discharge: 2021-01-26 | DRG: 164 | Disposition: A | Discharge status: Home / Self Care | Payer: 59 | Attending: Thoracic Surgery (Cardiothoracic Vascular Surgery) | Admitting: Thoracic Surgery (Cardiothoracic Vascular Surgery)

## 2021-01-22 DIAGNOSIS — I454 Nonspecific intraventricular block: Secondary | ICD-10-CM | POA: Diagnosis not present

## 2021-01-22 DIAGNOSIS — C7951 Secondary malignant neoplasm of bone: Secondary | ICD-10-CM | POA: Diagnosis present

## 2021-01-22 DIAGNOSIS — Z20822 Contact with and (suspected) exposure to covid-19: Secondary | ICD-10-CM | POA: Diagnosis present

## 2021-01-22 DIAGNOSIS — F419 Anxiety disorder, unspecified: Secondary | ICD-10-CM | POA: Diagnosis present

## 2021-01-22 DIAGNOSIS — C3411 Malignant neoplasm of upper lobe, right bronchus or lung: Principal | ICD-10-CM | POA: Diagnosis present

## 2021-01-22 DIAGNOSIS — K760 Fatty (change of) liver, not elsewhere classified: Secondary | ICD-10-CM | POA: Diagnosis present

## 2021-01-22 DIAGNOSIS — C782 Secondary malignant neoplasm of pleura: Secondary | ICD-10-CM | POA: Diagnosis present

## 2021-01-22 DIAGNOSIS — I10 Essential (primary) hypertension: Secondary | ICD-10-CM | POA: Diagnosis present

## 2021-01-22 DIAGNOSIS — J9382 Other air leak: Secondary | ICD-10-CM | POA: Diagnosis not present

## 2021-01-22 DIAGNOSIS — Z01818 Encounter for other preprocedural examination: Secondary | ICD-10-CM

## 2021-01-22 DIAGNOSIS — Z4682 Encounter for fitting and adjustment of non-vascular catheter: Secondary | ICD-10-CM

## 2021-01-22 DIAGNOSIS — F32A Depression, unspecified: Secondary | ICD-10-CM | POA: Diagnosis present

## 2021-01-22 DIAGNOSIS — D62 Acute posthemorrhagic anemia: Secondary | ICD-10-CM | POA: Diagnosis not present

## 2021-01-22 DIAGNOSIS — R918 Other nonspecific abnormal finding of lung field: Secondary | ICD-10-CM | POA: Diagnosis present

## 2021-01-22 DIAGNOSIS — Z902 Acquired absence of lung [part of]: Secondary | ICD-10-CM

## 2021-01-22 HISTORY — PX: LYMPH NODE DISSECTION: SHX5087

## 2021-01-22 HISTORY — PX: INTERCOSTAL NERVE BLOCK: SHX5021

## 2021-01-22 HISTORY — PX: VIDEO ASSISTED THORACOSCOPY (VATS)/ LOBECTOMY: SHX6169

## 2021-01-22 HISTORY — PX: THORACOTOMY: SHX5074

## 2021-01-22 LAB — POCT I-STAT, CHEM 8
BUN: 9 mg/dL (ref 6–20)
Calcium, Ion: 1.12 mmol/L — ABNORMAL LOW (ref 1.15–1.40)
Chloride: 100 mmol/L (ref 98–111)
Creatinine, Ser: 0.6 mg/dL — ABNORMAL LOW (ref 0.61–1.24)
Glucose, Bld: 155 mg/dL — ABNORMAL HIGH (ref 70–99)
HCT: 37 % — ABNORMAL LOW (ref 39.0–52.0)
Hemoglobin: 12.6 g/dL — ABNORMAL LOW (ref 13.0–17.0)
Potassium: 3.5 mmol/L (ref 3.5–5.1)
Sodium: 135 mmol/L (ref 135–145)
TCO2: 24 mmol/L (ref 22–32)

## 2021-01-22 LAB — GLUCOSE, CAPILLARY
Glucose-Capillary: 179 mg/dL — ABNORMAL HIGH (ref 70–99)
Glucose-Capillary: 207 mg/dL — ABNORMAL HIGH (ref 70–99)

## 2021-01-22 LAB — ABO/RH: ABO/RH(D): O POS

## 2021-01-22 IMAGING — DX DG CHEST 1V PORT
1 series · 1 of 1 positions shown · non-contrast
Comparison: Earlier the same day

CLINICAL DATA: 59-year-old male status post right upper lobectomy.

EXAM:
PORTABLE CHEST - 1 VIEW

[chest]
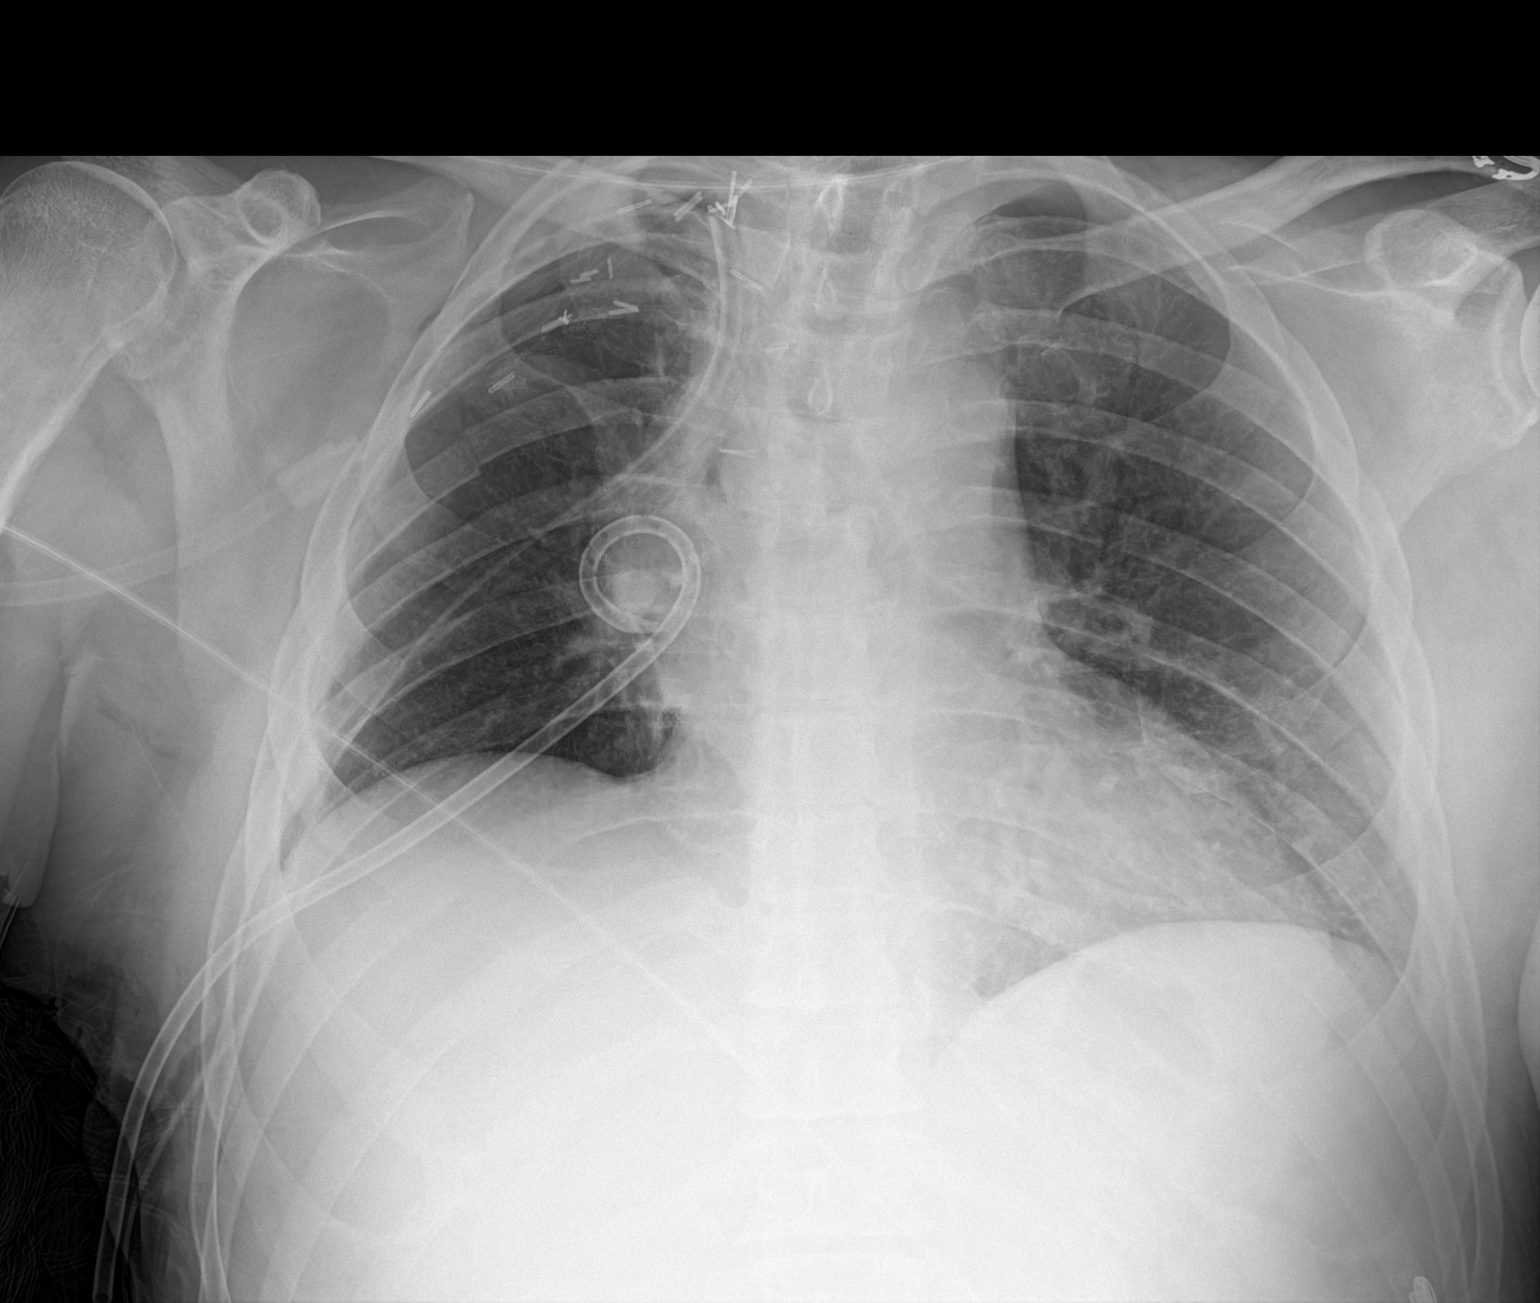

[1 of 1 positions shown; findings below may reference images not displayed]

FINDINGS: Mild obscuration of the right superior mediastinum. No cardiomegaly.
Postsurgical changes after right upper lobectomy. Interval insertion
of large bore apical thoracostomy tube and mid lung pigtail
thoracostomy tube which both appear to be well positioned. No
pneumothorax. Right posterior fifth rib osteotomy as well opposed.
No additional acute osseous abnormalities.
IMPRESSION: Immediate post surgical changes after right upper lobectomy without
complicating features. Right-sided large bore and pigtail
thoracostomy tubes well positioned without evidence of pneumothorax.

## 2021-01-22 IMAGING — CR DG CHEST 2V
2 series · 2 of 2 positions shown · non-contrast
Comparison: [DATE], [DATE]

CLINICAL DATA: 59-year-old male with lung mass.

EXAM:
CHEST - 2 VIEW

[w chest pa]
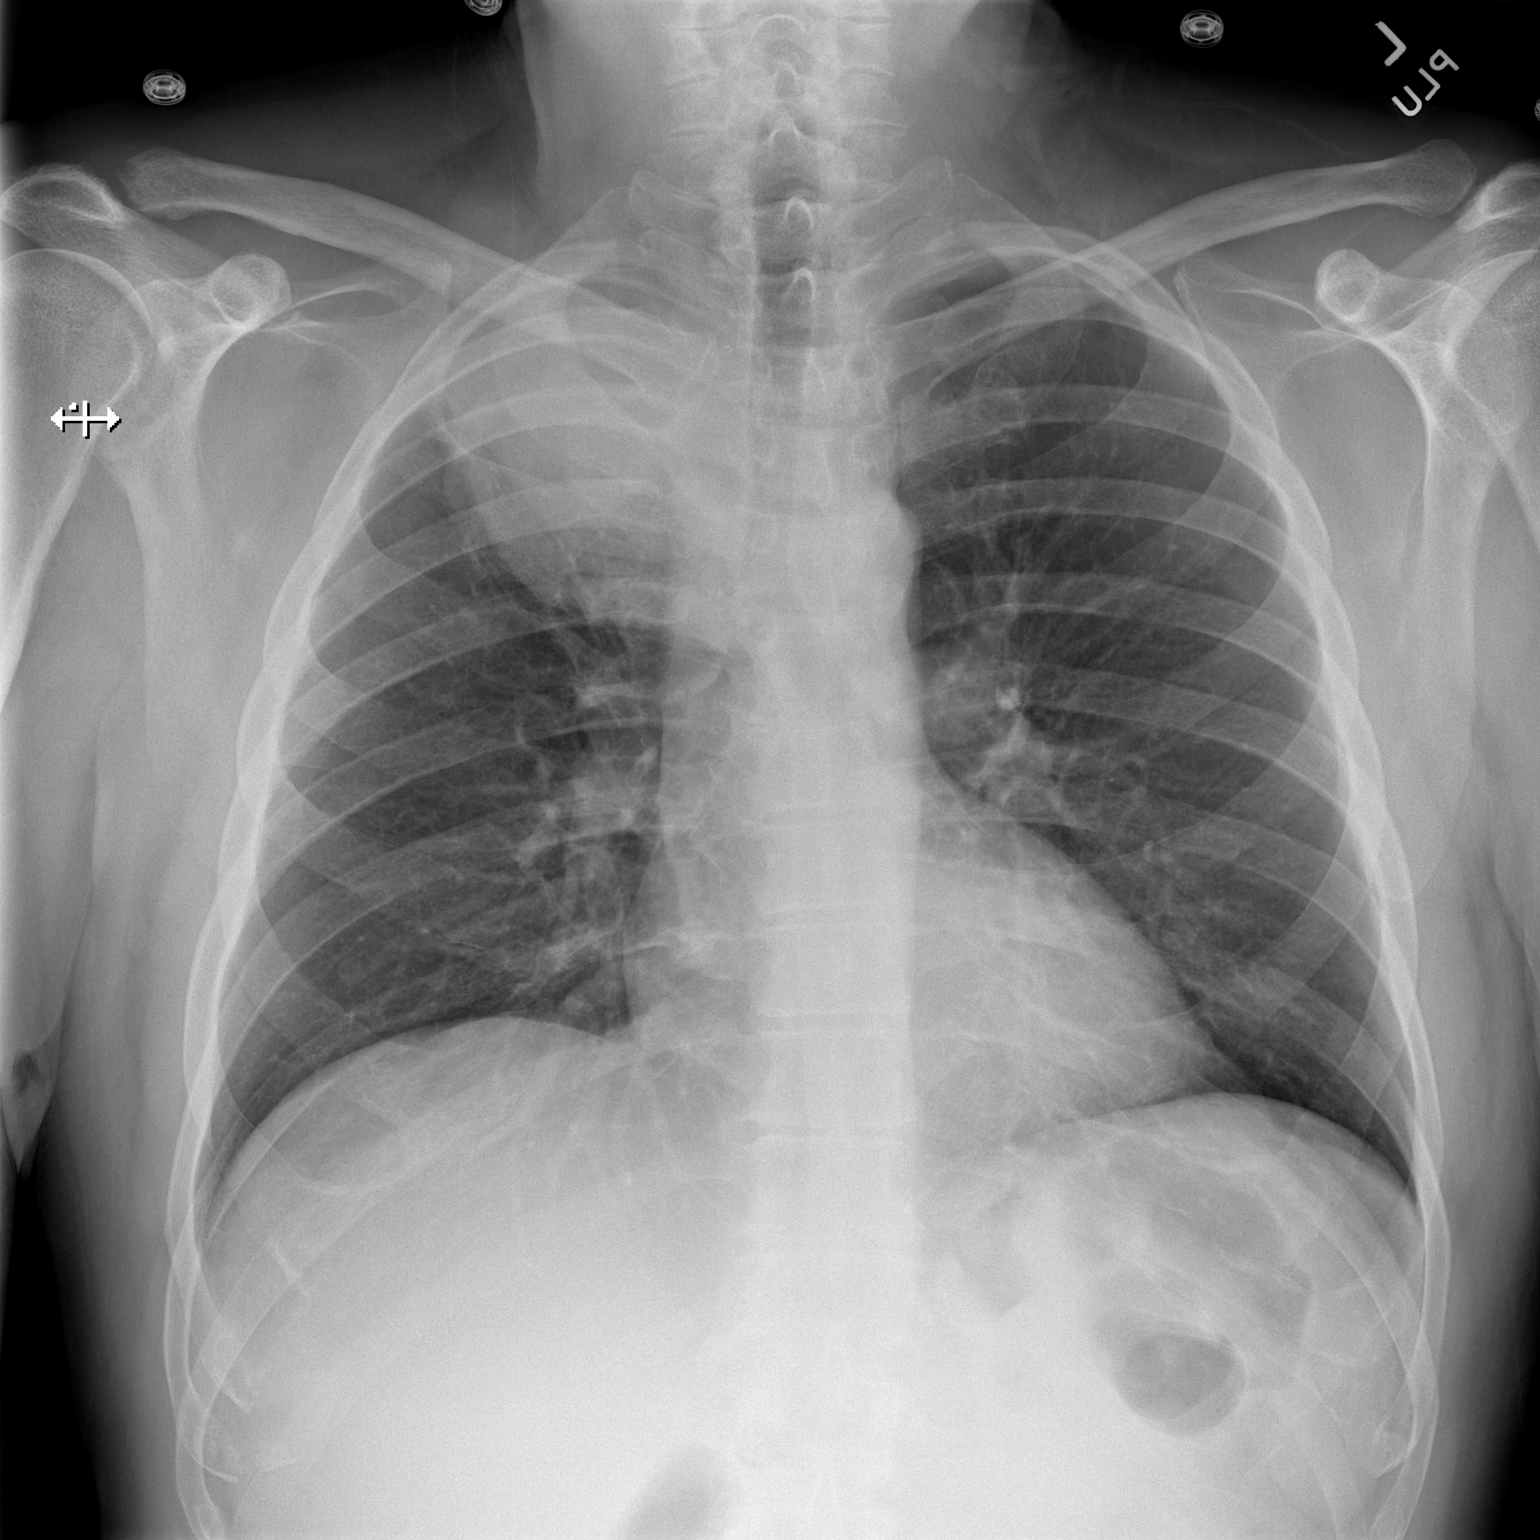

[w chest lat]
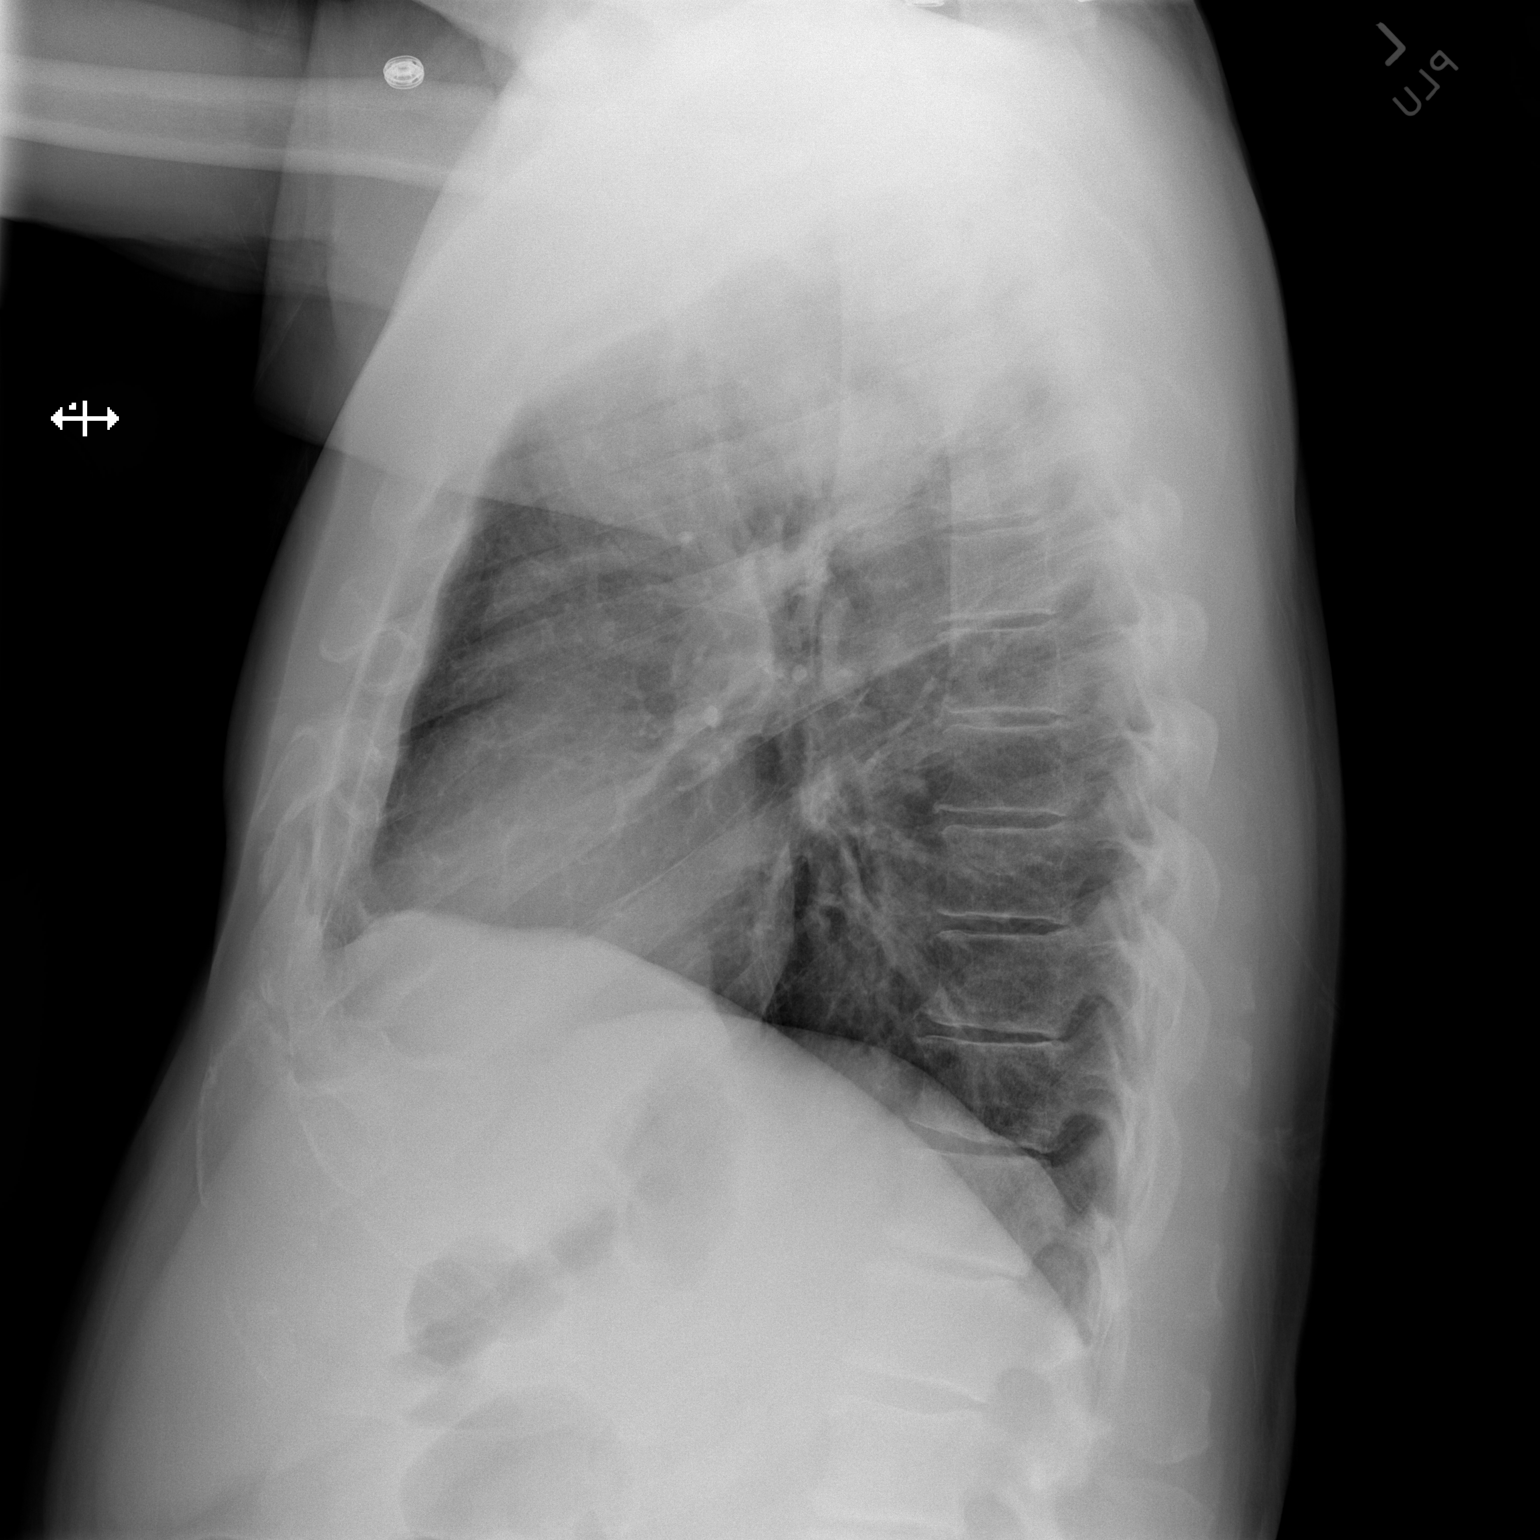

[2 of 2 positions shown; findings below may reference images not displayed]

FINDINGS: The mediastinal contours are within normal limits, again partially
obscured about the right superior mediastinum due to right apical
mass. No cardiomegaly. Similar appearing right apical mass which
measures approximately 8.3 x 8.9 cm on PA projection. The lungs are
otherwise clear bilaterally without evidence of focal consolidation,
pleural effusion, or pneumothorax. No acute osseous abnormality.
IMPRESSION: Similar appearing right apical mass.

## 2021-01-22 SURGERY — VIDEO ASSISTED THORACOSCOPY (VATS)/ LOBECTOMY
Anesthesia: General | Site: Chest | Laterality: Right

## 2021-01-22 MED ORDER — DIPHENHYDRAMINE HCL 12.5 MG/5ML PO ELIX: 12.5000 mg | ORAL_SOLUTION | Freq: Four times a day (QID) | ORAL | Status: DC | PRN

## 2021-01-22 MED ORDER — HYDROMORPHONE HCL 2 MG PO TABS: 2.0000 mg | ORAL_TABLET | ORAL | Status: DC | PRN

## 2021-01-22 MED ORDER — DEXAMETHASONE SODIUM PHOSPHATE 10 MG/ML IJ SOLN
INTRAMUSCULAR | Status: AC
  Filled 2021-01-22: qty 1

## 2021-01-22 MED ORDER — ONDANSETRON HCL 4 MG/2ML IJ SOLN
INTRAMUSCULAR | Status: DC | PRN
  Administered 2021-01-22: 4 mg via INTRAVENOUS

## 2021-01-22 MED ORDER — ROCURONIUM BROMIDE 10 MG/ML (PF) SYRINGE
PREFILLED_SYRINGE | INTRAVENOUS | Status: AC
  Filled 2021-01-22: qty 10

## 2021-01-22 MED ORDER — ONDANSETRON HCL 4 MG/2ML IJ SOLN
4.0000 mg | Freq: Four times a day (QID) | INTRAMUSCULAR | Status: DC | PRN
  Administered 2021-01-23 – 2021-01-24 (×4): 4 mg via INTRAVENOUS
  Filled 2021-01-22 (×4): qty 2

## 2021-01-22 MED ORDER — INSULIN ASPART 100 UNIT/ML IJ SOLN: 0.0000 [IU] | Freq: Three times a day (TID) | INTRAMUSCULAR | Status: DC

## 2021-01-22 MED ORDER — KETOROLAC TROMETHAMINE 15 MG/ML IJ SOLN
15.0000 mg | Freq: Four times a day (QID) | INTRAMUSCULAR | Status: DC
  Administered 2021-01-22 – 2021-01-26 (×13): 15 mg via INTRAVENOUS
  Filled 2021-01-22 (×14): qty 1

## 2021-01-22 MED ORDER — TRAMADOL HCL 50 MG PO TABS
50.0000 mg | ORAL_TABLET | Freq: Four times a day (QID) | ORAL | Status: DC | PRN
  Administered 2021-01-22: 100 mg via ORAL
  Filled 2021-01-22: qty 2

## 2021-01-22 MED ORDER — SODIUM CHLORIDE FLUSH 0.9 % IV SOLN
INTRAVENOUS | Status: DC | PRN
  Administered 2021-01-22: 100 mL

## 2021-01-22 MED ORDER — HYDROMORPHONE 1 MG/ML IV SOLN
INTRAVENOUS | Status: DC
  Administered 2021-01-22: 30 mg via INTRAVENOUS
  Administered 2021-01-22: 2.1 mg via INTRAVENOUS
  Administered 2021-01-23: 0.6 mg via INTRAVENOUS
  Administered 2021-01-23 (×2): 2.4 mg via INTRAVENOUS
  Administered 2021-01-23: 0.9 mg via INTRAVENOUS
  Administered 2021-01-23: 1.2 mg via INTRAVENOUS
  Administered 2021-01-23: 1.8 mg via INTRAVENOUS
  Administered 2021-01-24: 1 mg via INTRAVENOUS
  Administered 2021-01-24: 1.2 mg via INTRAVENOUS
  Administered 2021-01-24: 0.6 mg via INTRAVENOUS
  Administered 2021-01-24: 0.9 mg via INTRAVENOUS
  Administered 2021-01-24: 0.3 mg via INTRAVENOUS
  Administered 2021-01-25: 0.6 mg via INTRAVENOUS
  Administered 2021-01-26: 0.3 mg via INTRAVENOUS
  Filled 2021-01-22: qty 30

## 2021-01-22 MED ORDER — FENTANYL CITRATE (PF) 250 MCG/5ML IJ SOLN
INTRAMUSCULAR | Status: AC
  Filled 2021-01-22: qty 5

## 2021-01-22 MED ORDER — 0.9 % SODIUM CHLORIDE (POUR BTL) OPTIME
TOPICAL | Status: DC | PRN
  Administered 2021-01-22 (×2): 2000 mL

## 2021-01-22 MED ORDER — CEFAZOLIN SODIUM-DEXTROSE 2-4 GM/100ML-% IV SOLN
2.0000 g | INTRAVENOUS | Status: AC
  Administered 2021-01-22 (×2): 2 g via INTRAVENOUS

## 2021-01-22 MED ORDER — FENTANYL CITRATE (PF) 100 MCG/2ML IJ SOLN
INTRAMUSCULAR | Status: DC | PRN
  Administered 2021-01-22 (×3): 50 ug via INTRAVENOUS
  Administered 2021-01-22 (×2): 100 ug via INTRAVENOUS
  Administered 2021-01-22 (×2): 50 ug via INTRAVENOUS

## 2021-01-22 MED ORDER — HEMOSTATIC AGENTS (NO CHARGE) OPTIME
TOPICAL | Status: DC | PRN
  Administered 2021-01-22 (×3): 1 via TOPICAL

## 2021-01-22 MED ORDER — SODIUM CHLORIDE 0.9 % IV SOLN: INTRAVENOUS | Status: DC | PRN

## 2021-01-22 MED ORDER — KETOROLAC TROMETHAMINE 15 MG/ML IJ SOLN
15.0000 mg | Freq: Four times a day (QID) | INTRAMUSCULAR | Status: DC
  Administered 2021-01-22: 15 mg via INTRAVENOUS
  Filled 2021-01-22: qty 1

## 2021-01-22 MED ORDER — NALOXONE HCL 0.4 MG/ML IJ SOLN: 0.4000 mg | INTRAMUSCULAR | Status: DC | PRN

## 2021-01-22 MED ORDER — DIPHENHYDRAMINE HCL 50 MG/ML IJ SOLN: 12.5000 mg | Freq: Four times a day (QID) | INTRAMUSCULAR | Status: DC | PRN

## 2021-01-22 MED ORDER — ACETAMINOPHEN 160 MG/5ML PO SOLN: 1000.0000 mg | Freq: Four times a day (QID) | ORAL | Status: DC

## 2021-01-22 MED ORDER — PROPOFOL 10 MG/ML IV BOLUS
INTRAVENOUS | Status: AC
  Filled 2021-01-22: qty 40

## 2021-01-22 MED ORDER — KETOROLAC TROMETHAMINE 15 MG/ML IJ SOLN
INTRAMUSCULAR | Status: AC
  Filled 2021-01-22: qty 1

## 2021-01-22 MED ORDER — MIDAZOLAM HCL 2 MG/2ML IJ SOLN
INTRAMUSCULAR | Status: AC
  Filled 2021-01-22: qty 2

## 2021-01-22 MED ORDER — CEFAZOLIN SODIUM 1 G IJ SOLR
INTRAMUSCULAR | Status: AC
  Filled 2021-01-22: qty 20

## 2021-01-22 MED ORDER — CEFAZOLIN SODIUM-DEXTROSE 2-4 GM/100ML-% IV SOLN
2.0000 g | Freq: Three times a day (TID) | INTRAVENOUS | Status: AC
  Administered 2021-01-22 – 2021-01-23 (×2): 2 g via INTRAVENOUS
  Filled 2021-01-22 (×2): qty 100

## 2021-01-22 MED ORDER — ACETAMINOPHEN 160 MG/5ML PO SOLN: 1000.0000 mg | Freq: Once | ORAL | Status: DC | PRN

## 2021-01-22 MED ORDER — ACETAMINOPHEN 500 MG PO TABS: 1000.0000 mg | ORAL_TABLET | Freq: Once | ORAL | Status: DC | PRN

## 2021-01-22 MED ORDER — ROCURONIUM BROMIDE 10 MG/ML (PF) SYRINGE
PREFILLED_SYRINGE | INTRAVENOUS | Status: DC | PRN
  Administered 2021-01-22: 30 mg via INTRAVENOUS
  Administered 2021-01-22: 20 mg via INTRAVENOUS
  Administered 2021-01-22: 30 mg via INTRAVENOUS
  Administered 2021-01-22 (×2): 20 mg via INTRAVENOUS
  Administered 2021-01-22: 70 mg via INTRAVENOUS
  Administered 2021-01-22: 30 mg via INTRAVENOUS
  Administered 2021-01-22: 20 mg via INTRAVENOUS

## 2021-01-22 MED ORDER — LIDOCAINE 2% (20 MG/ML) 5 ML SYRINGE
INTRAMUSCULAR | Status: DC | PRN
  Administered 2021-01-22: 80 mg via INTRAVENOUS

## 2021-01-22 MED ORDER — BISACODYL 5 MG PO TBEC
10.0000 mg | DELAYED_RELEASE_TABLET | Freq: Every day | ORAL | Status: DC
  Administered 2021-01-23 – 2021-01-24 (×2): 10 mg via ORAL
  Filled 2021-01-22 (×2): qty 2

## 2021-01-22 MED ORDER — ACETAMINOPHEN 500 MG PO TABS
1000.0000 mg | ORAL_TABLET | Freq: Four times a day (QID) | ORAL | Status: DC
  Administered 2021-01-22 – 2021-01-26 (×11): 1000 mg via ORAL
  Filled 2021-01-22 (×11): qty 2

## 2021-01-22 MED ORDER — LACTATED RINGERS IV SOLN: INTRAVENOUS | Status: DC | PRN

## 2021-01-22 MED ORDER — SUGAMMADEX SODIUM 200 MG/2ML IV SOLN
INTRAVENOUS | Status: DC | PRN
  Administered 2021-01-22: 200 mg via INTRAVENOUS

## 2021-01-22 MED ORDER — HYDROMORPHONE HCL 2 MG PO TABS
1.0000 mg | ORAL_TABLET | ORAL | Status: DC | PRN
Start: 2021-01-22 — End: 2021-01-26

## 2021-01-22 MED ORDER — FENTANYL CITRATE (PF) 100 MCG/2ML IJ SOLN
25.0000 ug | INTRAMUSCULAR | Status: DC | PRN
  Administered 2021-01-22: 50 ug via INTRAVENOUS

## 2021-01-22 MED ORDER — MIDAZOLAM HCL 5 MG/5ML IJ SOLN
INTRAMUSCULAR | Status: DC | PRN
  Administered 2021-01-22: 2 mg via INTRAVENOUS

## 2021-01-22 MED ORDER — PROPOFOL 10 MG/ML IV BOLUS
INTRAVENOUS | Status: DC | PRN
  Administered 2021-01-22: 200 mg via INTRAVENOUS

## 2021-01-22 MED ORDER — SODIUM CHLORIDE 0.9 % IV SOLN
INTRAVENOUS | Status: DC | PRN
  Administered 2021-01-22: 25 ug/min via INTRAVENOUS

## 2021-01-22 MED ORDER — SODIUM CHLORIDE 0.9% FLUSH: 9.0000 mL | INTRAVENOUS | Status: DC | PRN

## 2021-01-22 MED ORDER — BUPIVACAINE HCL (PF) 0.5 % IJ SOLN
INTRAMUSCULAR | Status: AC
  Filled 2021-01-22: qty 30

## 2021-01-22 MED ORDER — SENNOSIDES-DOCUSATE SODIUM 8.6-50 MG PO TABS
1.0000 | ORAL_TABLET | Freq: Every day | ORAL | Status: DC
  Administered 2021-01-22 – 2021-01-23 (×2): 1 via ORAL
  Filled 2021-01-22 (×2): qty 1

## 2021-01-22 MED ORDER — ASPIRIN EC 81 MG PO TBEC
81.0000 mg | DELAYED_RELEASE_TABLET | Freq: Every day | ORAL | Status: DC
  Administered 2021-01-23 – 2021-01-26 (×4): 81 mg via ORAL
  Filled 2021-01-22 (×4): qty 1

## 2021-01-22 MED ORDER — FENTANYL CITRATE (PF) 250 MCG/5ML IJ SOLN: INTRAMUSCULAR | Status: DC | PRN

## 2021-01-22 MED ORDER — ONDANSETRON HCL 4 MG/2ML IJ SOLN
INTRAMUSCULAR | Status: AC
  Filled 2021-01-22: qty 2

## 2021-01-22 MED ORDER — FENTANYL CITRATE (PF) 100 MCG/2ML IJ SOLN
INTRAMUSCULAR | Status: AC
  Filled 2021-01-22: qty 2

## 2021-01-22 MED ORDER — DEXAMETHASONE SODIUM PHOSPHATE 10 MG/ML IJ SOLN
INTRAMUSCULAR | Status: DC | PRN
  Administered 2021-01-22: 5 mg via INTRAVENOUS

## 2021-01-22 MED ORDER — ENOXAPARIN SODIUM 40 MG/0.4ML IJ SOSY
40.0000 mg | PREFILLED_SYRINGE | Freq: Every day | INTRAMUSCULAR | Status: DC
  Administered 2021-01-23 – 2021-01-26 (×4): 40 mg via SUBCUTANEOUS
  Filled 2021-01-22 (×4): qty 0.4

## 2021-01-22 MED ORDER — LIDOCAINE 2% (20 MG/ML) 5 ML SYRINGE
INTRAMUSCULAR | Status: AC
  Filled 2021-01-22: qty 5

## 2021-01-22 MED ORDER — CHLORHEXIDINE GLUCONATE 0.12 % MT SOLN
OROMUCOSAL | Status: AC
  Administered 2021-01-22: 15 mL
  Filled 2021-01-22: qty 15

## 2021-01-22 MED ORDER — PHENYLEPHRINE 40 MCG/ML (10ML) SYRINGE FOR IV PUSH (FOR BLOOD PRESSURE SUPPORT)
PREFILLED_SYRINGE | INTRAVENOUS | Status: AC
  Filled 2021-01-22: qty 10

## 2021-01-22 MED ORDER — ALBUMIN HUMAN 5 % IV SOLN: INTRAVENOUS | Status: DC | PRN

## 2021-01-22 MED ORDER — SODIUM CHLORIDE 0.9 % IV SOLN: INTRAVENOUS | Status: DC

## 2021-01-22 MED ORDER — CEFAZOLIN SODIUM-DEXTROSE 2-4 GM/100ML-% IV SOLN
INTRAVENOUS | Status: AC
  Filled 2021-01-22: qty 100

## 2021-01-22 MED ORDER — SERTRALINE HCL 100 MG PO TABS
100.0000 mg | ORAL_TABLET | Freq: Every day | ORAL | Status: DC
  Administered 2021-01-23 – 2021-01-26 (×4): 100 mg via ORAL
  Filled 2021-01-22 (×4): qty 1

## 2021-01-22 MED ORDER — BUPIVACAINE LIPOSOME 1.3 % IJ SUSP
INTRAMUSCULAR | Status: AC
  Filled 2021-01-22: qty 20

## 2021-01-22 MED ORDER — ACETAMINOPHEN 10 MG/ML IV SOLN: 1000.0000 mg | Freq: Once | INTRAVENOUS | Status: DC | PRN

## 2021-01-22 SURGICAL SUPPLY — 111 items
APPLIER CLIP ROT 10 11.4 M/L (STAPLE) ×6
BIT DRILL 7/64X5 DISP (BIT) ×3 IMPLANT
BLADE CLIPPER SURG (BLADE) IMPLANT
CANISTER SUCT 3000ML PPV (MISCELLANEOUS) ×6 IMPLANT
CATH THORACIC 28FR (CATHETERS) IMPLANT
CATH THORACIC 28FR RT ANG (CATHETERS) IMPLANT
CATH THORACIC 36FR (CATHETERS) IMPLANT
CATH THORACIC 36FR RT ANG (CATHETERS) IMPLANT
CLIP APPLIE ROT 10 11.4 M/L (STAPLE) ×4 IMPLANT
CLIP VESOCCLUDE MED 6/CT (CLIP) ×9 IMPLANT
CNTNR URN SCR LID CUP LEK RST (MISCELLANEOUS) ×32 IMPLANT
CONN ST 1/4X3/8  BEN (MISCELLANEOUS) ×3
CONN ST 1/4X3/8 BEN (MISCELLANEOUS) ×2 IMPLANT
CONN Y 3/8X3/8X3/8  BEN (MISCELLANEOUS) ×3
CONN Y 3/8X3/8X3/8 BEN (MISCELLANEOUS) ×2 IMPLANT
CONT SPEC 4OZ STRL OR WHT (MISCELLANEOUS) ×48
COVER SURGICAL LIGHT HANDLE (MISCELLANEOUS) IMPLANT
CUTTER ECHEON FLEX ENDO 45 340 (ENDOMECHANICALS) ×3 IMPLANT
DEFOGGER SCOPE WARMER CLEARIFY (MISCELLANEOUS) ×3 IMPLANT
DERMABOND ADHESIVE PROPEN (GAUZE/BANDAGES/DRESSINGS) ×1
DERMABOND ADVANCED (GAUZE/BANDAGES/DRESSINGS) ×1
DERMABOND ADVANCED .7 DNX12 (GAUZE/BANDAGES/DRESSINGS) ×2 IMPLANT
DERMABOND ADVANCED .7 DNX6 (GAUZE/BANDAGES/DRESSINGS) ×2 IMPLANT
DRAIN CHANNEL 28F RND 3/8 FF (WOUND CARE) IMPLANT
DRAIN CHANNEL 32F RND 10.7 FF (WOUND CARE) IMPLANT
DRAPE CV SPLIT W-CLR ANES SCRN (DRAPES) ×3 IMPLANT
DRAPE ORTHO SPLIT 77X108 STRL (DRAPES) ×3
DRAPE SURG ORHT 6 SPLT 77X108 (DRAPES) ×2 IMPLANT
DRAPE WARM FLUID 44X44 (DRAPES) IMPLANT
ELECT BLADE 6.5 EXT (BLADE) ×3 IMPLANT
ELECT REM PT RETURN 9FT ADLT (ELECTROSURGICAL) ×3
ELECTRODE REM PT RTRN 9FT ADLT (ELECTROSURGICAL) ×2 IMPLANT
FELT TEFLON 1X6 (MISCELLANEOUS) ×3 IMPLANT
GAUZE SPONGE 4X4 12PLY STRL (GAUZE/BANDAGES/DRESSINGS) ×3 IMPLANT
GAUZE SPONGE 4X4 12PLY STRL LF (GAUZE/BANDAGES/DRESSINGS) ×3 IMPLANT
GLOVE SURG SIGNA 7.5 PF LTX (GLOVE) ×9 IMPLANT
GOWN STRL REUS W/ TWL LRG LVL3 (GOWN DISPOSABLE) ×4 IMPLANT
GOWN STRL REUS W/ TWL XL LVL3 (GOWN DISPOSABLE) ×2 IMPLANT
GOWN STRL REUS W/TWL LRG LVL3 (GOWN DISPOSABLE) ×6
GOWN STRL REUS W/TWL XL LVL3 (GOWN DISPOSABLE) ×3
HEMOSTAT SURGICEL 2X14 (HEMOSTASIS) ×3 IMPLANT
INSERT FOGARTY 61MM (MISCELLANEOUS) IMPLANT
KIT BASIN OR (CUSTOM PROCEDURE TRAY) ×3 IMPLANT
KIT SUCTION CATH 14FR (SUCTIONS) ×3 IMPLANT
KIT TURNOVER KIT B (KITS) ×3 IMPLANT
NEEDLE HYPO 25GX1X1/2 BEV (NEEDLE) ×3 IMPLANT
NEEDLE SPNL 18GX3.5 QUINCKE PK (NEEDLE) IMPLANT
NS IRRIG 1000ML POUR BTL (IV SOLUTION) ×15 IMPLANT
PACK CHEST (CUSTOM PROCEDURE TRAY) ×3 IMPLANT
PAD ARMBOARD 7.5X6 YLW CONV (MISCELLANEOUS) ×6 IMPLANT
PASSER SUT SWANSON 36MM LOOP (INSTRUMENTS) IMPLANT
POUCH ENDO CATCH II 15MM (MISCELLANEOUS) IMPLANT
POUCH SPECIMEN RETRIEVAL 10MM (ENDOMECHANICALS) IMPLANT
SCISSORS LAP 5X35 DISP (ENDOMECHANICALS) IMPLANT
SEALANT PROGEL (MISCELLANEOUS) ×6 IMPLANT
SEALANT SURG COSEAL 4ML (VASCULAR PRODUCTS) IMPLANT
SEALANT SURG COSEAL 8ML (VASCULAR PRODUCTS) IMPLANT
SET TUBE SMOKE EVAC HIGH FLOW (TUBING) ×3 IMPLANT
SHEARS HARMONIC HDI 20CM (ELECTROSURGICAL) IMPLANT
SOL ANTI FOG 6CC (MISCELLANEOUS) ×2 IMPLANT
SOLUTION ANTI FOG 6CC (MISCELLANEOUS) ×1
SPECIMEN JAR MEDIUM (MISCELLANEOUS) IMPLANT
SPONGE INTESTINAL PEANUT (DISPOSABLE) ×12 IMPLANT
SPONGE LAP 18X18 RF (DISPOSABLE) ×3 IMPLANT
SPONGE TONSIL TAPE 1 RFD (DISPOSABLE) ×3 IMPLANT
STAPLE RELOAD 2.5MM WHITE (STAPLE) ×15 IMPLANT
STAPLE RELOAD 45 GRN (STAPLE) ×26 IMPLANT
STAPLE RELOAD 45MM GOLD (STAPLE) ×15 IMPLANT
STAPLE RELOAD 45MM GREEN (STAPLE) ×39
STAPLER VASCULAR ECHELON 35 (CUTTER) ×3 IMPLANT
STAPLER VISISTAT 35W (STAPLE) IMPLANT
STOPCOCK 4 WAY LG BORE MALE ST (IV SETS) IMPLANT
SUT PDS AB 3-0 SH 27 (SUTURE) IMPLANT
SUT PROLENE 2 0 MH 48 (SUTURE) IMPLANT
SUT PROLENE 2 0 SH DA (SUTURE) IMPLANT
SUT PROLENE 3 0 SH 48 (SUTURE) IMPLANT
SUT PROLENE 4 0 RB 1 (SUTURE) ×6
SUT PROLENE 4 0 SH DA (SUTURE) IMPLANT
SUT PROLENE 4-0 RB1 .5 CRCL 36 (SUTURE) ×4 IMPLANT
SUT SILK  1 MH (SUTURE) ×6
SUT SILK 1 MH (SUTURE) ×4 IMPLANT
SUT SILK 1 TIES 10X30 (SUTURE) ×3 IMPLANT
SUT SILK 2 0 SH (SUTURE) IMPLANT
SUT SILK 2 0SH CR/8 30 (SUTURE) ×3 IMPLANT
SUT SILK 3 0 SH 30 (SUTURE) IMPLANT
SUT SILK 3 0SH CR/8 30 (SUTURE) ×3 IMPLANT
SUT VIC AB 1 CTX 18 (SUTURE) IMPLANT
SUT VIC AB 1 CTX 36 (SUTURE) ×9
SUT VIC AB 1 CTX36XBRD ANBCTR (SUTURE) ×6 IMPLANT
SUT VIC AB 2-0 CTX 36 (SUTURE) ×9 IMPLANT
SUT VIC AB 3-0 MH 27 (SUTURE) IMPLANT
SUT VIC AB 3-0 X1 27 (SUTURE) ×6 IMPLANT
SUT VICRYL 0 UR6 27IN ABS (SUTURE) ×3 IMPLANT
SUT VICRYL 2 TP 1 (SUTURE) ×6 IMPLANT
SWAB CULTURE ESWAB REG 1ML (MISCELLANEOUS) IMPLANT
SYR 10ML LL (SYRINGE) ×3 IMPLANT
SYR 20ML LL LF (SYRINGE) ×6 IMPLANT
SYR 30ML LL (SYRINGE) ×3 IMPLANT
SYR 50ML LL SCALE MARK (SYRINGE) ×3 IMPLANT
SYSTEM RETRIEVAL ANCHOR 15 (MISCELLANEOUS) ×3 IMPLANT
SYSTEM SAHARA CHEST DRAIN ATS (WOUND CARE) ×3 IMPLANT
TAPE CLOTH 4X10 WHT NS (GAUZE/BANDAGES/DRESSINGS) ×3 IMPLANT
TAPE CLOTH SURG 4X10 WHT LF (GAUZE/BANDAGES/DRESSINGS) ×3 IMPLANT
TIP APPLICATOR SPRAY EXTEND 16 (VASCULAR PRODUCTS) IMPLANT
TOWEL GREEN STERILE (TOWEL DISPOSABLE) ×6 IMPLANT
TOWEL GREEN STERILE FF (TOWEL DISPOSABLE) ×3 IMPLANT
TRAY FOLEY MTR SLVR 16FR STAT (SET/KITS/TRAYS/PACK) ×3 IMPLANT
TRAY FOLEY SLVR 16FR LF STAT (SET/KITS/TRAYS/PACK) IMPLANT
TROCAR XCEL BLADELESS 5X75MML (TROCAR) ×6 IMPLANT
TUBING EXTENTION W/L.L. (IV SETS) IMPLANT
WATER STERILE IRR 1000ML POUR (IV SOLUTION) ×3 IMPLANT

## 2021-01-22 NOTE — Brief Op Note (Addendum)
01/22/2021  1:50 PM  PATIENT:  Christopher Carter  60 y.o. 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)   PROCEDURE:  Procedure(s): VIDEO ASSISTED THORACOSCOPY (VATS)/RIGHT UPPER LOBECTOMY (Right) THORACOTOMY (Right) INTERCOSTAL NERVE BLOCK (Right) LYMPH NODE DISSECTION (Right)  SURGEON:  Surgeon(s) and Role:    * [name], MD - Primary  PHYSICIAN ASSISTANT:  Nicholes Rough, PA-C   ANESTHESIA:   general  EBL:  350 mL   BLOOD ADMINISTERED:none  DRAINS: ONE PIGTAIL CATHETER AND ONE BLAKE DRAIN   LOCAL MEDICATIONS USED:  BUPIVICAINE   SPECIMEN:  Source of Specimen:  RIGHT UPPER LOBE  DISPOSITION OF SPECIMEN:  PATHOLOGY  COUNTS:  YES  DICTATION: .Dragon Dictation  PLAN OF CARE: Admit to inpatient   PATIENT DISPOSITION:  PACU - hemodynamically stable.   Delay start of Pharmacological VTE agent (>24hrs) due to surgical blood loss or risk of bleeding: no

## 2021-01-22 NOTE — Anesthesia Procedure Notes (Signed)
Arterial Line Insertion Start/End5/16/2022 6:50 AM, 01/22/2021 7:00 AM Performed by: Oleta Mouse, MD, Gaylene Brooks, CRNA  Patient location: Pre-op. Preanesthetic checklist: patient identified radial was placed Catheter size: 20 G Seldinger technique used  Attempts: 1 Procedure performed without using ultrasound guided technique. Following insertion, dressing applied and Biopatch. Post procedure assessment: normal  Patient tolerated the procedure well with no immediate complications. Additional procedure comments: Performed by Jacqualine Code.

## 2021-01-22 NOTE — Transfer of Care (Signed)
Immediate Anesthesia Transfer of Care Note  Patient: Christopher Carter  Procedure(s) Performed: VIDEO ASSISTED THORACOSCOPY (VATS)/RIGHT UPPER LOBECTOMY (Right Chest) THORACOTOMY (Right ) INTERCOSTAL NERVE BLOCK (Right Chest) LYMPH NODE DISSECTION (Right Chest)  Patient Location: PACU  Anesthesia Type:General  Level of Consciousness: awake, drowsy and patient cooperative  Airway & Oxygen Therapy: Patient Spontanous Breathing and Patient connected to nasal cannula oxygen  Post-op Assessment: Report given to RN, Post -op Vital signs reviewed and stable and Patient moving all extremities X 4  Post vital signs: Reviewed and stable  Last Vitals:  Vitals Value Taken Time  BP 150/99 01/22/21 1348  Temp    Pulse 92 01/22/21 1352  Resp 16 01/22/21 1352  SpO2 94 % 01/22/21 1352  Vitals shown include unvalidated device data.  Last Pain:  Vitals:   01/22/21 0553  TempSrc: Oral         Complications: No complications documented.

## 2021-01-22 NOTE — Hospital Course (Addendum)
      [address]       [place] [address]             [phone]        HPI:   Christopher Carter is a 60 year old non-smoker with a past medical history significant for hypertension and depression.  He has been feeling poorly since the first of the year.  He has had a cough that just would not go away.  He tested negative for COVID a couple of times.  His wife finally talked him into going to see the doctor and chest x-ray showed a right upper lobe mass.  He had a CT on 12/07/2020 which showed a 7.5 x 6.4 x 6.4 cm mass in the right upper lobe.  This abutted the pleural surface apically but there was no definite invasion.  PET/CT showed the mass was hypermetabolic.  There was no evidence of regional or distant metastatic disease.  He was referred to Dr. Earlie Server and then Dr. Lamonte Sakai.  Dr. Lamonte Sakai did a bronchoscopy and endobronchial ultrasound.  The mass was positive for non-small cell carcinoma.   He has a loss of appetite and has lost about 25 pounds over the past 3 months.  He has been wheezing and coughing up blood.  He does complain of some muscular pain in the right shoulder but thinks it is because been sleeping in an unusual position.  He does not have any burning or tingling pain.  He used to work on a pretty regular basis and frequently was [employer].  He has not been working out since the first of the year as he has not been feeling well.  He does complain of decreased energy.  Hospital Course:   On 01/22/2021 Christopher Carter underwent a right upper lobectomy and chest wall dissection with Dr. Roxan Hockey. He tolerated the procedure well and was transferred to the stepdown unit in stable condition. His chest tube was on water seal leaving the OR.  The patient was experiencing post operative pain as expected.  His post operative CXR was free from pneumothorax.  His chest tube remained free from air leak, output was 350 cc.  He was hypertensive and his home Norvasc  and Avapro were resumed.  His blake drain was removed on 5/18. His pigtail catheter remains on more day.  It was safely removed on 01/25/2021 and follow-up chest x-ray showed no evidence of pneumothorax.  He developed some symptoms of significant indigestion as well as diarrhea which was managed conservatively and resolved over time.  On postop day #4 the patient was ambulating well.  Oxygen has been weaned and he maintains good saturations on room air.  Incision continues to heal well without evidence of infection.  He is tolerating diet.  Postoperative lab values are stable.  He does have a mild expected acute blood loss anemia with most recent hemoglobin hematocrit dated 01/24/2021 of 11.0/34.1.  Renal function has remained within normal limits.  Overall at the time of discharge the patient is felt to be quite stable.

## 2021-01-22 NOTE — Interval H&P Note (Signed)
History and Physical Interval Note:  01/22/2021 7:21 AM  Christopher Carter  has presented today for surgery, with the diagnosis of RUL ADENOCARCINOMA.  The various methods of treatment have been discussed with the patient and family. After consideration of risks, benefits and other options for treatment, the patient has consented to  Procedure(s): VIDEO ASSISTED THORACOSCOPY (VATS)/RIGHT UPPER LOBECTOMY (Right) THORACOTOMY (Right) as a surgical intervention.  The patient's history has been reviewed, patient examined, no change in status, stable for surgery.  I have reviewed the patient's chart and labs.  Questions were answered to the patient's satisfaction.     [name]

## 2021-01-22 NOTE — Anesthesia Procedure Notes (Signed)
Procedure Name: Intubation Date/Time: 01/22/2021 7:55 AM Performed by: Gaylene Brooks, CRNA Pre-anesthesia Checklist: Patient identified, Emergency Drugs available, Suction available and Patient being monitored Patient Re-evaluated:Patient Re-evaluated prior to induction Oxygen Delivery Method: Circle system utilized Preoxygenation: Pre-oxygenation with 100% oxygen Induction Type: IV induction Ventilation: Oral airway inserted - appropriate to patient size and Mask ventilation without difficulty Laryngoscope Size: Mac and 4 (IIa view) Grade View: Grade II Tube type: Oral Endobronchial tube: Left and 39 Fr Number of attempts: 1 Airway Equipment and Method: Stylet Placement Confirmation: ETT inserted through vocal cords under direct vision Secured at: 31 cm Tube secured with: Tape Dental Injury: Teeth and Oropharynx as per pre-operative assessment  Comments: Performed by Jacqualine Code

## 2021-01-23 ENCOUNTER — Encounter (HOSPITAL_COMMUNITY): Payer: Self-pay | Admitting: Thoracic Surgery (Cardiothoracic Vascular Surgery)

## 2021-01-23 ENCOUNTER — Inpatient Hospital Stay (HOSPITAL_COMMUNITY): Payer: 59

## 2021-01-23 LAB — BASIC METABOLIC PANEL
Anion gap: 9 (ref 5–15)
BUN: 10 mg/dL (ref 6–20)
CO2: 27 mmol/L (ref 22–32)
Calcium: 8.4 mg/dL — ABNORMAL LOW (ref 8.9–10.3)
Chloride: 100 mmol/L (ref 98–111)
Creatinine, Ser: 0.66 mg/dL (ref 0.61–1.24)
GFR, Estimated: >60 mL/min (ref >60)
Glucose, Bld: 135 mg/dL — ABNORMAL HIGH (ref 70–99)
Potassium: 3.8 mmol/L (ref 3.5–5.1)
Sodium: 136 mmol/L (ref 135–145)

## 2021-01-23 LAB — CBC
HCT: 35.2 % — ABNORMAL LOW (ref 39.0–52.0)
Hemoglobin: 11.5 g/dL — ABNORMAL LOW (ref 13.0–17.0)
MCH: 30.3 pg (ref 26.0–34.0)
MCHC: 32.7 g/dL (ref 30.0–36.0)
MCV: 92.9 fL (ref 80.0–100.0)
Platelets: 281 10*3/uL (ref 150–400)
RBC: 3.79 MIL/uL — ABNORMAL LOW (ref 4.22–5.81)
RDW: 14.2 % (ref 11.5–15.5)
WBC: 14.1 10*3/uL — ABNORMAL HIGH (ref 4.0–10.5)
nRBC: 0 % (ref 0.0–0.2)

## 2021-01-23 LAB — GLUCOSE, CAPILLARY: Glucose-Capillary: 143 mg/dL — ABNORMAL HIGH (ref 70–99)

## 2021-01-23 IMAGING — DX DG CHEST 1V PORT
1 series · 1 of 1 positions shown · non-contrast
Comparison: [DATE]; [DATE]

CLINICAL DATA: History of lung surgery with chest tubes in place.

EXAM:
PORTABLE CHEST 1 VIEW

[chest ap]
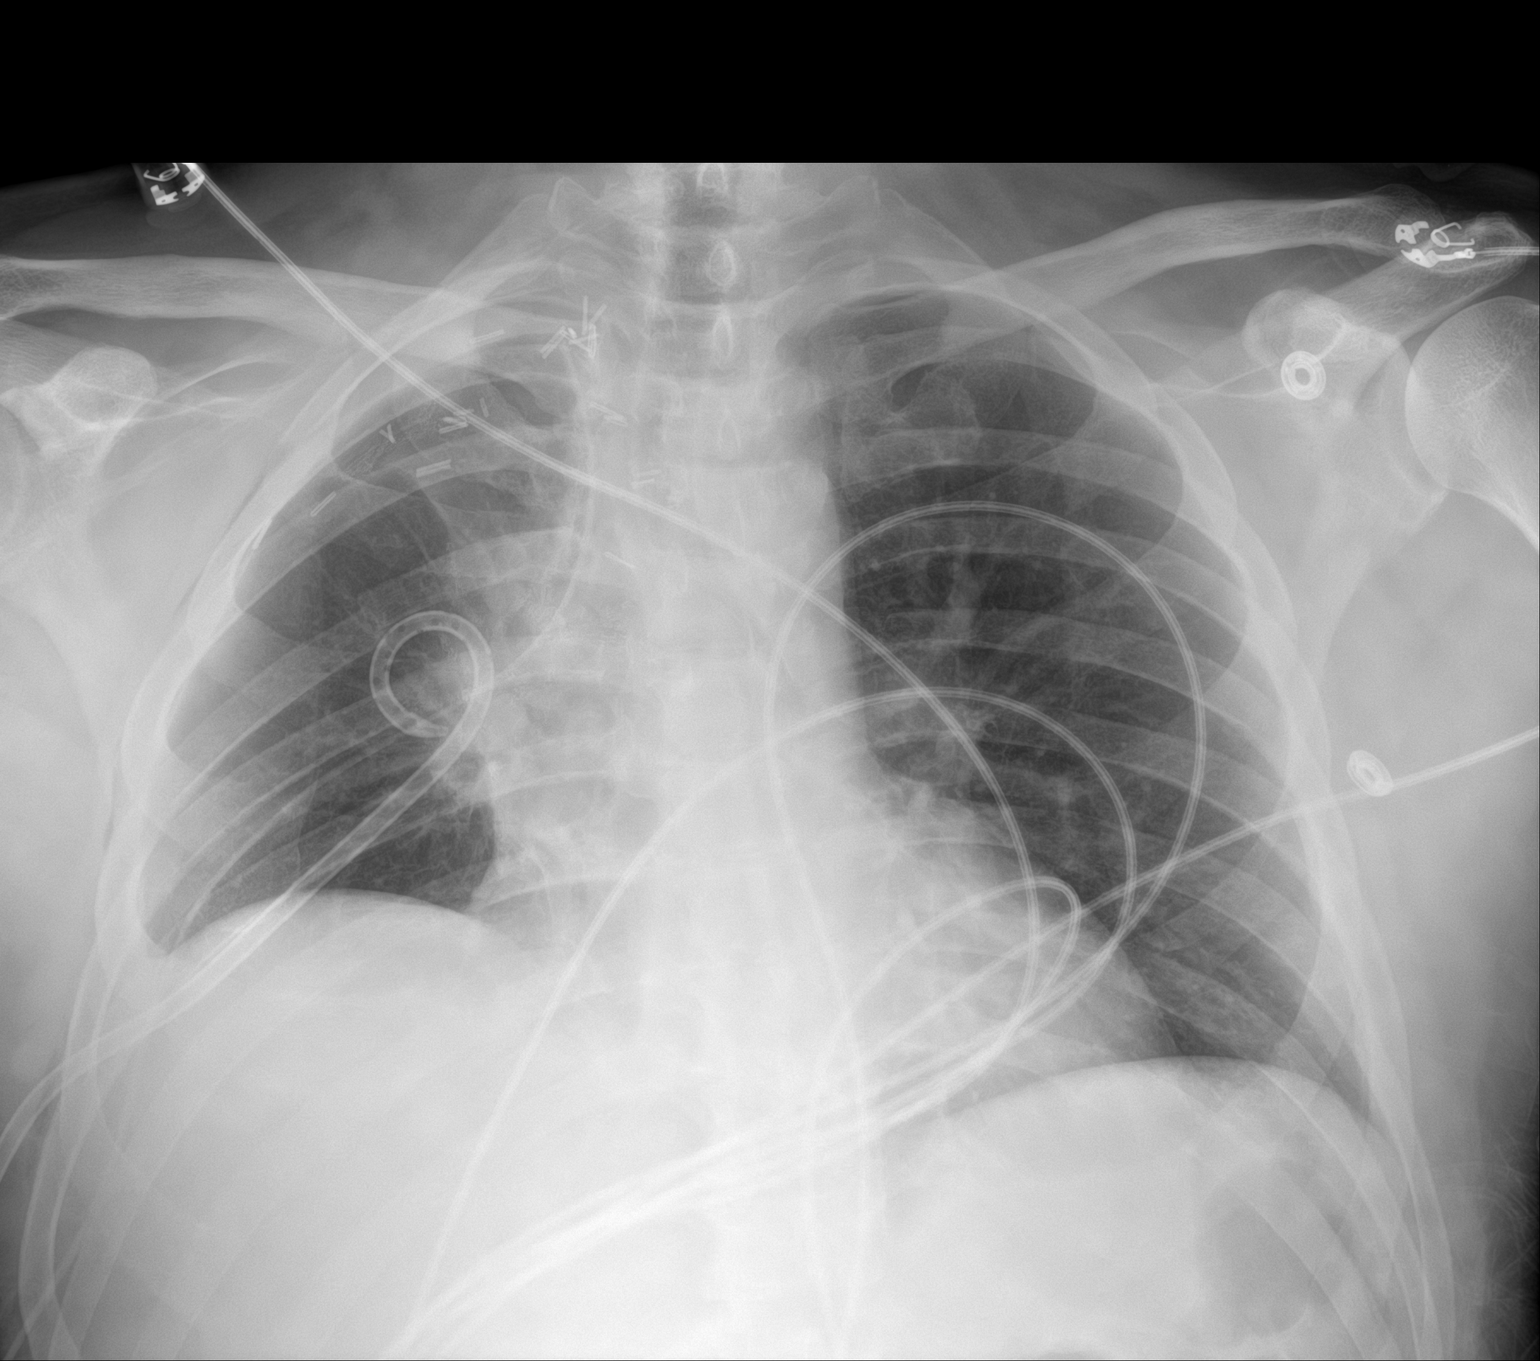

[1 of 1 positions shown; findings below may reference images not displayed]

FINDINGS: Grossly unchanged cardiac silhouette and mediastinal contours with
postoperative change of the right hilum and upper lung with
associated volume loss. Stable position of support apparatus. Trace
amount of right lateral chest wall subcutaneous emphysema,
unchanged. No definite pneumothorax. There is persistent blunting of
the right costophrenic angle potentially indicative of a trace
right-sided effusion. The left hemithorax remains well aerated. No
acute osseous abnormalities.
IMPRESSION: Stable positioning of support apparatus and postoperative change of
the right lung without definite pneumothorax.

## 2021-01-23 MED ORDER — IRBESARTAN 150 MG PO TABS
300.0000 mg | ORAL_TABLET | Freq: Every day | ORAL | Status: DC
  Administered 2021-01-23 – 2021-01-26 (×4): 300 mg via ORAL
  Filled 2021-01-23 (×3): qty 2

## 2021-01-23 MED ORDER — QUETIAPINE FUMARATE 50 MG PO TABS
50.0000 mg | ORAL_TABLET | Freq: Every evening | ORAL | Status: DC | PRN
  Administered 2021-01-24 – 2021-01-25 (×2): 50 mg via ORAL
  Filled 2021-01-23 (×2): qty 1

## 2021-01-23 MED ORDER — AMLODIPINE BESYLATE 5 MG PO TABS
5.0000 mg | ORAL_TABLET | Freq: Every day | ORAL | Status: DC
  Administered 2021-01-23 – 2021-01-25 (×3): 5 mg via ORAL
  Filled 2021-01-23 (×3): qty 1

## 2021-01-23 NOTE — Op Note (Signed)
Christopher Carter, Christopher Carter MEDICAL RECORD NO: [id number] ACCOUNT NO: [id number] DATE OF BIRTH: 1960/10/04 FACILITY: [facility] LOCATION: [facility] PHYSICIAN: Revonda Standard. Roxan Hockey, MD  Operative Report   DATE OF PROCEDURE: 01/22/2021  PREOPERATIVE DIAGNOSIS:  Adenocarcinoma, right upper lobe, clinical stage IIB (T3, N0).  POSTOPERATIVE DIAGNOSIS:  Adenocarcinoma, right upper lobe, clinical stage IIB (T3, N0).  PROCEDURE:   Right thoracotomy, Extrapleural right upper lobectomy, Lymph node dissection and Intercostal nerve blocks levels 3 through 10.  SURGEON:  Revonda Standard. Roxan Hockey, MD  ASSISTANT: Nicholes Rough, PA  ANESTHESIA:  General.  FINDINGS:  Large mass in the right upper lobe adherent to the chest wall, extrapleural resection performed, invasion superiorly and posteriorly near the vertebral column.  CLINICAL NOTE: Christopher Carter is a 60 year old nonsmoker, who presented with persistent cough.  He ultimately had a chest x-ray which showed a right upper lobe mass that was confirmed on CT.  On PET CT, the mass was hypermetabolic.  There was no evidence  of regional or distant metastatic disease.  Bronchoscopy showed non-small cell carcinoma.  He was offered the options of neoadjuvant chemoradiation followed by surgery or proceeding with surgery initially and then possible radiation and chemo  afterwards.  The indications, risks, benefits, and alternatives were discussed in detail with the patient.  He understood and accepted the risks and agreed to proceed.  OPERATIVE NOTE: Christopher Carter was brought to the preoperative holding area on 01/22/2021.  Anesthesia established venous access and placed an arterial blood pressure monitoring line.  He was taken to the operating room, anesthetized, and intubated with  a double lumen endotracheal tube.  Intravenous antibiotics were administered.  A Foley catheter was placed.  Sequential compression devices were placed on the calves for DVT prophylaxis.   He was placed in the left lateral decubitus position and the right  chest was prepped and draped in the usual sterile fashion.  Single lung ventilation of the left lung was initiated and was tolerated well throughout the procedure.  A timeout was performed.  A solution containing 20 mL of liposomal bupivacaine, 30 mL of 0.5% bupivacaine and 50 mL of saline was prepared.  This was used for local at the incision sites as well as for the intercostal nerve blocks.  An incision was made  in the eighth interspace and the 5 mm port was inserted.  The thoracoscope was advanced into the chest.  There was good isolation of the right long superiorly, but there did appear to be involvement of the parietal pleura.  A posterolateral thoracotomy was  performed.  This was a serratus sparing incision.  The chest was entered through the fifth interspace and the fifth rib was shingled posteriorly.  A retractor was placed and gently opened over time. Large mass was present in the right upper lobe.  There  was no pleural effusion.  The inferior ligament was divided with cautery.  The pleural reflection was divided to the hilum posteriorly.  All lymph nodes that were encountered during the dissection were removed and sent as separate specimens.  There were  relatively enlarged level 7 nodes. Level 8 and 9 nodes appeared normal.  Superiorly, the dissection was limited by the mass.  The fissures were inspected and were incomplete.  Initially, attempts were made to take down the mass with incising the parietal  pleura 1 cm away from the edge of the tumor with cautery.  An attempt was made to develop a plane through that area, but visualization was limited.  Decision  was made to proceed with lobectomy and then a complete chest wall resection later.  The  dissection was carried out in the fissure between the superior segment and the upper lobe, but the fissure was incomplete and the pulmonary artery could not be identified in that  area.  Ultimately, the PA was identified in the portion of the major fissure  between the middle lobe and the lower lobe.  Once identified, the fissure was completed with sequential firings of an Echelon stapler.  The posterior ascending branch of the right pulmonary artery was identified.  This was encircled and divided with a vascular stapler.   Level 11 and 12 nodes were removed.  The minor fissure then was completed.  The main pulmonary artery and the lateral middle lobe branch were identified and those were preserved and the minor fissure was completed with sequential firings of the stapler  as well.  The superior pulmonary vein was identified.  The middle lobe branch was preserved.  The remainder of the vein was encircled and divided with a vascular stapler.  Dissecting along the main pulmonary artery, the anterior and apical branches were  identified.  Dissection was very difficult in this area because of the size of the tumor, but ultimately both of those vessels were dissected out.  There was a suspicious appearing node at the bifurcation, which was removed.  Those vessels were both  divided with a vascular stapler.  Finally, the stapler was placed across the right upper lobe bronchus at its origin.  A test inflation showed good aeration of the lower and middle lobes.  The stapler was fired transecting the right upper lobe bronchus.   The right upper lobe then was taken off the chest wall bluntly, placed into an endoscopic retrieval bag, removed, and sent for frozen section of the bronchial margin.  On inspecting the chest wall, there was a suspicious area posteriorly.  This was in  close proximity to the vertebral column.  This area was excised with cautery maintaining a margin initially, but tumor extended into the area around the spine and transverse processes.  There did not appear to be any way to do a formal chest wall dissection in that area without involving the  vertebral column.  Frozen  section of this area was positive for non-small cell carcinoma.  Biopsies were sent from the margins and there was a positive posterior margin.  Again, there was no way to take additional tissue in that area.  The area was  marked for subsequent radiation treatment.  The chest was copiously irrigated with warm saline.  A test inflation revealed air leakage in the fissure.  Additional areas were stapled and then Progel was applied.  A 28-French Blake drain was placed and  advanced posteriorly and a pigtail catheter was placed more anteriorly.  Both were secured with #1 silk sutures.  The thoracotomy was closed with #2 Vicryl pericostal sutures.  The holes were drilled in the rib for placement of the sutures to avoid  impingement of the intercostal nerves.  The muscular layers were closed with a running #1 Vicryl sutures.  The subcutaneous tissue and skin were closed in standard fashion.  The chest tubes were placed to waterseal.  The patient was placed back in supine  position.  He was extubated in the operating room and taken to the postanesthetic care unit in good condition.   NIK D: 01/22/2021 7:23:48 pm T: 01/23/2021 10:45:00 am  JOB: [id number]/ [id number]

## 2021-01-23 NOTE — Progress Notes (Addendum)
      [address]       ,[place] [address]             [phone]      1 Day Post-Op Procedure(s) (LRB): VIDEO ASSISTED THORACOSCOPY (VATS)/RIGHT UPPER LOBECTOMY (Right) THORACOTOMY (Right) INTERCOSTAL NERVE BLOCK (Right) LYMPH NODE DISSECTION (Right)   Subjective:  Patient doing okay.  States pain is up there but it responds to medication.  Denies N/V  Objective: Vital signs in last 24 hours: Temp:  [97.6 F (36.4 C)-98.5 F (36.9 C)] 98.5 F (36.9 C) (05/17 0719) Pulse Rate:  [65-94] 82 (05/17 0719) Cardiac Rhythm: Heart block (05/17 0719) Resp:  [10-18] 16 (05/17 0719) BP: (114-150)/(76-99) 127/80 (05/17 0719) SpO2:  [94 %-98 %] 95 % (05/17 0719) Arterial Line BP: (80-170)/(61-70) 80/61 (05/16 1533) FiO2 (%):  [20 %] 20 % (05/16 2120)  Intake/Output from previous day: 05/16 0701 - 05/17 0700 In: 2250 [I.V.:2000; IV Piggyback:250] Out: 795 [Urine:285; Blood:350; Chest Tube:160]  General appearance: alert, cooperative and no distress Heart: regular rate and rhythm Lungs: clear to auscultation bilaterally Abdomen: soft, non-tender; bowel sounds normal; no masses,  no organomegaly Extremities: extremities normal, atraumatic, no cyanosis or edema Wound: clean and dry  Lab Results: Recent Labs    01/22/21 0609 01/23/21 0136  WBC  --  14.1*  HGB 12.6* 11.5*  HCT 37.0* 35.2*  PLT  --  281   BMET:  Recent Labs    01/22/21 0609 01/23/21 0136  NA 135 136  K 3.5 3.8  CL 100 100  CO2  --  27  GLUCOSE 155* 135*  BUN 9 10  CREATININE 0.60* 0.66  CALCIUM  --  8.4*    PT/INR: No results for input(s): LABPROT, INR in the last 72 hours. ABG    Component Value Date/Time   PHART 7.455 (H) 01/18/2021 1504   HCO3 22.1 01/18/2021 1504   TCO2 24 01/22/2021 0609   ACIDBASEDEF 1.3 01/18/2021 1504   O2SAT 97.5 01/18/2021 1504   CBG (last 3)  Recent Labs    01/22/21 1649 01/22/21 2145 01/23/21 0605  GLUCAP 179* 207* 143*     Assessment/Plan: S/P Procedure(s) (LRB): VIDEO ASSISTED THORACOSCOPY (VATS)/RIGHT UPPER LOBECTOMY (Right) THORACOTOMY (Right) INTERCOSTAL NERVE BLOCK (Right) LYMPH NODE DISSECTION (Right)  1. CV- NSR, + HTN- restart home Norvasc, Avapro 2. Pulm- no air leak on water seal, CXR w/o pneumothorax- output 350 cc since surgery, will leave chest tube in place today repeat CXR in AM if remains stable and output remains low will likely d/c in AM 3. Renal- creatinine, lytes okay d/c IV Fluids, d/c foley catheter 4. CBGs- patient not a diabetic, will d/c SSIP 5. Lovenox for DVT prophylaxis 6. dispo- patient stable, restart home HTN meds, stop IV fluids, d/c foley, repeat CXR in AM   LOS: 1 day    Ellwood Handler, PA-C 01/23/2021 Patient seen and examined, agree with above Looks great Leave tube in today [place]. Roxan Hockey, MD [facility] [phone]

## 2021-01-23 NOTE — Progress Notes (Incomplete)
      [address]       [place] [address]             [phone]      1 Day Post-Op Procedure(s) (LRB): VIDEO ASSISTED THORACOSCOPY (VATS)/RIGHT UPPER LOBECTOMY (Right) THORACOTOMY (Right) INTERCOSTAL NERVE BLOCK (Right) LYMPH NODE DISSECTION (Right)   Subjective:  Doing okay.  States pain is up there, but gets relief with medication.  Denies N/V.  Objective: Vital signs in last 24 hours: Temp:  [97.6 F (36.4 C)-98.5 F (36.9 C)] 98.5 F (36.9 C) (05/17 0719) Pulse Rate:  [65-94] 82 (05/17 0719) Cardiac Rhythm: Heart block (05/17 0719) Resp:  [10-18] 16 (05/17 0719) BP: (114-150)/(76-99) 127/80 (05/17 0719) SpO2:  [94 %-98 %] 95 % (05/17 0719) Arterial Line BP: (80-170)/(61-70) 80/61 (05/16 1533) FiO2 (%):  [20 %] 20 % (05/16 2120)  Intake/Output from previous day: 05/16 0701 - 05/17 0700 In: 2250 [I.V.:2000; IV Piggyback:250] Out: 795 [Urine:285; Blood:350; Chest Tube:160]  General appearance: alert, cooperative and no distress Heart: regular rate and rhythm Lungs: clear to auscultation bilaterally Abdomen: soft, non-tender; bowel sounds normal; no masses,  no organomegaly Extremities: extremities normal, atraumatic, no cyanosis or edema Wound: clean and dry  Lab Results: Recent Labs    01/22/21 0609 01/23/21 0136  WBC  --  14.1*  HGB 12.6* 11.5*  HCT 37.0* 35.2*  PLT  --  281   BMET:  Recent Labs    01/22/21 0609 01/23/21 0136  NA 135 136  K 3.5 3.8  CL 100 100  CO2  --  27  GLUCOSE 155* 135*  BUN 9 10  CREATININE 0.60* 0.66  CALCIUM  --  8.4*    PT/INR: No results for input(s): LABPROT, INR in the last 72 hours. ABG    Component Value Date/Time   PHART 7.455 (H) 01/18/2021 1504   HCO3 22.1 01/18/2021 1504   TCO2 24 01/22/2021 0609   ACIDBASEDEF 1.3 01/18/2021 1504   O2SAT 97.5 01/18/2021 1504   CBG (last 3)  Recent Labs    01/22/21 1649 01/22/21 2145 01/23/21 0605  GLUCAP 179* 207* 143*     Assessment/Plan: S/P Procedure(s) (LRB): VIDEO ASSISTED THORACOSCOPY (VATS)/RIGHT UPPER LOBECTOMY (Right) THORACOTOMY (Right) INTERCOSTAL NERVE BLOCK (Right) LYMPH NODE DISSECTION (Right)  1.    LOS: 1 day    Ellwood Handler 01/23/2021

## 2021-01-24 ENCOUNTER — Inpatient Hospital Stay (HOSPITAL_COMMUNITY): Payer: 59

## 2021-01-24 LAB — COMPREHENSIVE METABOLIC PANEL
ALT: 21 U/L (ref 0–44)
AST: 24 U/L (ref 15–41)
Albumin: 2.7 g/dL — ABNORMAL LOW (ref 3.5–5.0)
Alkaline Phosphatase: 68 U/L (ref 38–126)
Anion gap: 7 (ref 5–15)
BUN: 15 mg/dL (ref 6–20)
CO2: 28 mmol/L (ref 22–32)
Calcium: 8.6 mg/dL — ABNORMAL LOW (ref 8.9–10.3)
Chloride: 100 mmol/L (ref 98–111)
Creatinine, Ser: 0.61 mg/dL (ref 0.61–1.24)
GFR, Estimated: >60 mL/min (ref >60)
Glucose, Bld: 121 mg/dL — ABNORMAL HIGH (ref 70–99)
Potassium: 3.5 mmol/L (ref 3.5–5.1)
Sodium: 135 mmol/L (ref 135–145)
Total Bilirubin: 0.4 mg/dL (ref 0.3–1.2)
Total Protein: 5.9 g/dL — ABNORMAL LOW (ref 6.5–8.1)

## 2021-01-24 LAB — CBC
HCT: 34.1 % — ABNORMAL LOW (ref 39.0–52.0)
Hemoglobin: 11 g/dL — ABNORMAL LOW (ref 13.0–17.0)
MCH: 30.5 pg (ref 26.0–34.0)
MCHC: 32.3 g/dL (ref 30.0–36.0)
MCV: 94.5 fL (ref 80.0–100.0)
Platelets: 271 10*3/uL (ref 150–400)
RBC: 3.61 MIL/uL — ABNORMAL LOW (ref 4.22–5.81)
RDW: 14.2 % (ref 11.5–15.5)
WBC: 9.8 10*3/uL (ref 4.0–10.5)
nRBC: 0 % (ref 0.0–0.2)

## 2021-01-24 IMAGING — DX DG CHEST 1V PORT
1 series · 1 of 1 positions shown · non-contrast
Comparison: [DATE]

CLINICAL DATA: Chest pain.  Chest tube present.

EXAM:
PORTABLE CHEST 1 VIEW

[chest]
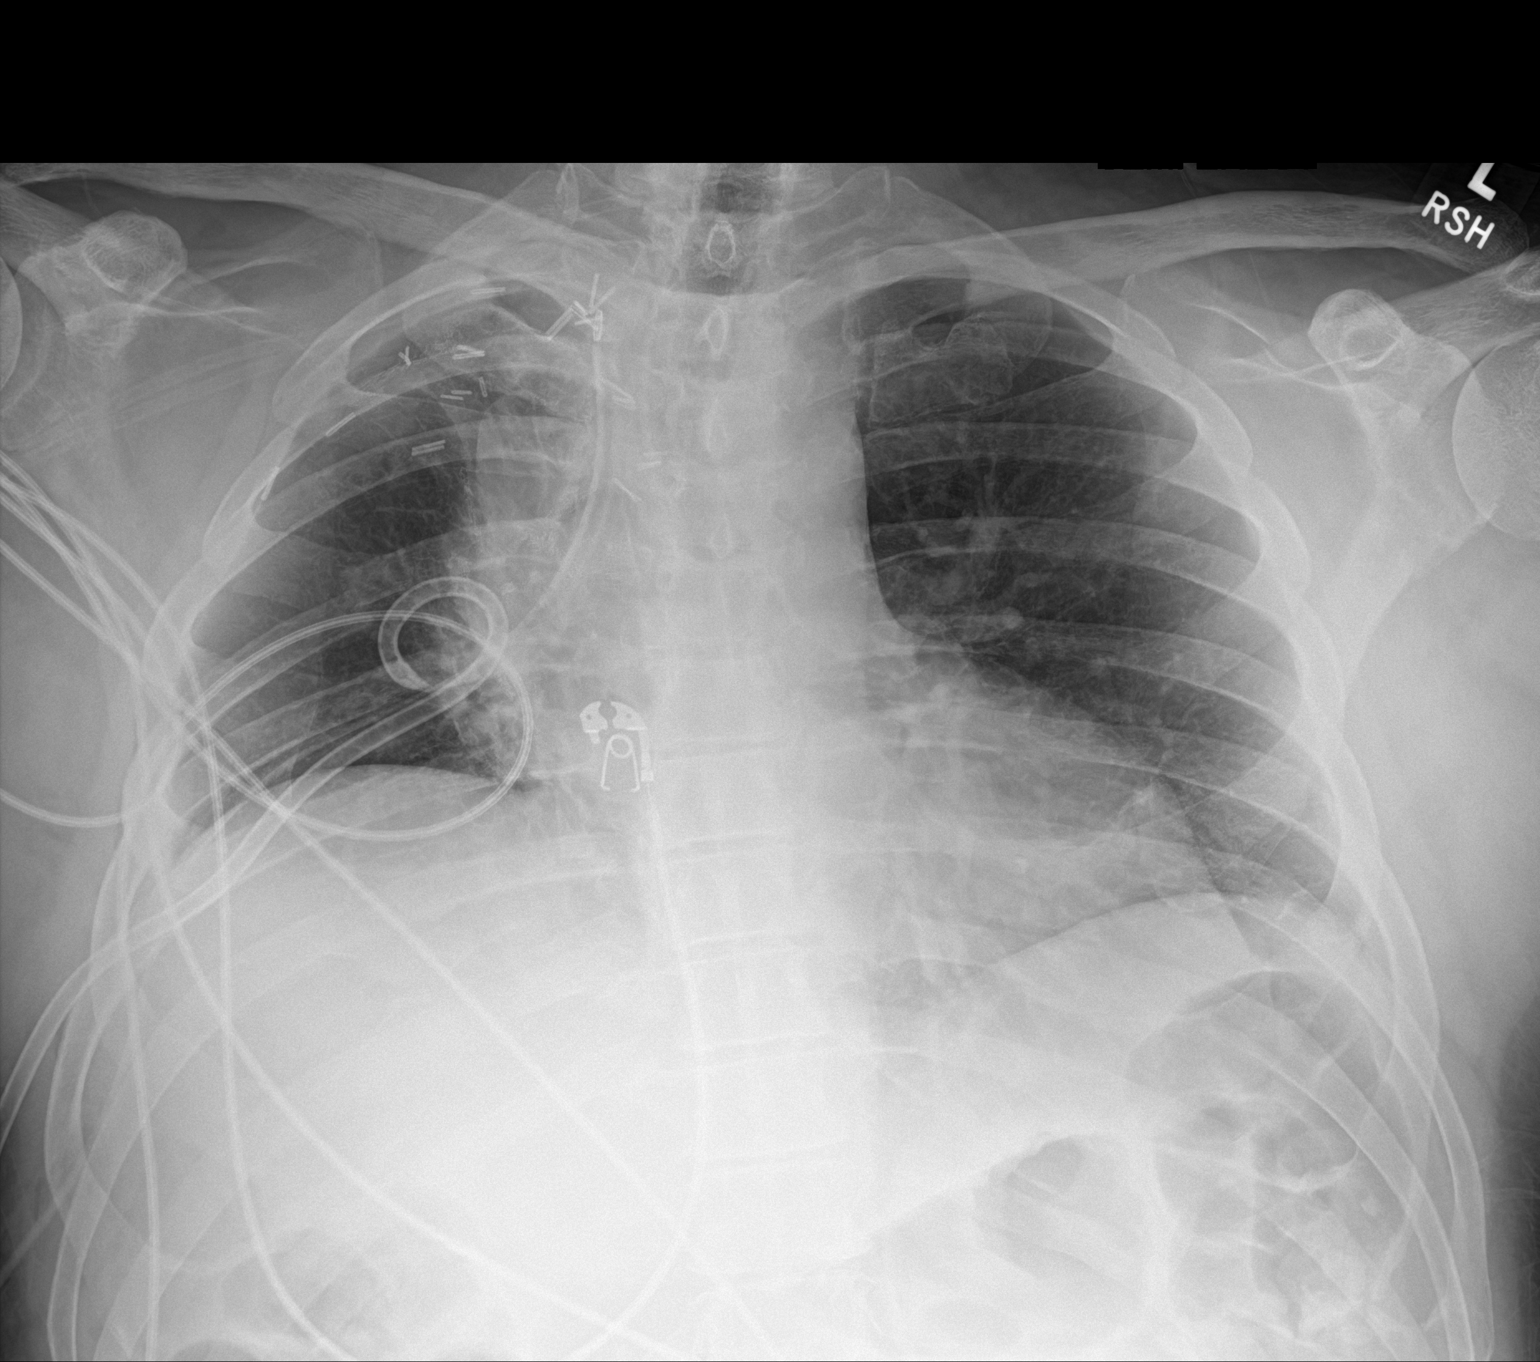

[1 of 1 positions shown; findings below may reference images not displayed]

FINDINGS: Chest tubes again noted on the right, unchanged in position. No
apparent pneumothorax. Mild subcutaneous air on the right again
noted. Postoperative change with volume loss on the right. No edema
or airspace opacity. Heart upper normal in size with pulmonary
vascularity normal. No adenopathy. No bone lesions.
IMPRESSION: Stable chest tubes on the right without evident pneumothorax.
Postoperative change on the right. No edema or airspace opacity.
Stable cardiac silhouette.

## 2021-01-24 NOTE — Discharge Summary (Addendum)
Physician Discharge Summary  Patient ID: Christopher Carter MRN: [id number] DOB/AGE: 10-30-1960 60 y.o.  Admit date: 01/22/2021 Discharge date: 01/26/2021  Admission Diagnoses: Right upper lobe lung mass  Patient Active Problem List   Diagnosis Date Noted  . Hypertension 01/15/2021  . Mass of upper lobe of right lung 12/22/2020  . Hemoptysis 12/22/2020  . Small bowel obstruction ([place]) 04/23/2017    Discharge Diagnoses: Adenocarcinoma right upper lobe, stage IIIa (T4, N0) Active Problems:   S/P lobectomy of lung     HPI:   Christopher Carter is a 59 year old non-smoker with a past medical history significant for hypertension and depression.  He has been feeling poorly since the first of the year.  He has had a cough that just would not go away.  He tested negative for COVID a couple of times.  His wife finally talked him into going to see the doctor and chest x-ray showed a right upper lobe mass.  He had a CT on 12/07/2020 which showed a 7.5 x 6.4 x 6.4 cm mass in the right upper lobe.  This abutted the pleural surface apically but there was no definite invasion.  PET/CT showed the mass was hypermetabolic.  There was no evidence of regional or distant metastatic disease.  He was referred to Dr. Earlie Server and then Dr. Lamonte Sakai.  Dr. Lamonte Sakai did a bronchoscopy and endobronchial ultrasound.  The mass was positive for non-small cell carcinoma.   He has a loss of appetite and has lost about 25 pounds over the past 3 months.  He has been wheezing and coughing up blood.  He does complain of some muscular pain in the right shoulder but thinks it is because been sleeping in an unusual position.  He does not have any burning or tingling pain.  He used to work on a pretty regular basis and frequently was [employer].  He has not been working out since the first of the year as he has not been feeling well.  He does complain of decreased energy.  Hospital Course:   On 01/22/2021 Christopher Carter  underwent a right upper lobectomy and chest wall dissection with Dr. Roxan Hockey. He tolerated the procedure well and was transferred to the stepdown unit in stable condition. His chest tube was on water seal leaving the OR.  The patient was experiencing post operative pain as expected.  His post operative CXR was free from pneumothorax.  His chest tube remained free from air leak, output was 350 cc.  He was hypertensive and his home Norvasc and Avapro were resumed.  His blake drain was removed on 5/18. His pigtail catheter remains on more Christopher.  It was safely removed on 01/25/2021 and follow-up chest x-ray showed no evidence of pneumothorax.  He developed some symptoms of significant indigestion as well as diarrhea which was managed conservatively and resolved over time.  On postop Christopher #4 the patient was ambulating well.  Oxygen has been weaned and he maintains good saturations on room air.  Incision continues to heal well without evidence of infection.  He is tolerating diet.  Postoperative lab values are stable.  He does have a mild expected acute blood loss anemia with most recent hemoglobin hematocrit dated 01/24/2021 of 11.0/34.1.  Renal function has remained within normal limits.  Overall at the time of discharge the patient is felt to be quite stable.Pathology has returned and full report is listed below. He will need XRT/Chemo which will be arranged as outpatient.   Discharged  Condition: good  Consults: None  Significant Diagnostic Studies:  CLINICAL DATA:  Chest pain.  Chest tube present.  EXAM: PORTABLE CHEST 1 VIEW  COMPARISON:  Jan 23, 2021  FINDINGS: Chest tubes again noted on the right, unchanged in position. No apparent pneumothorax. Mild subcutaneous air on the right again noted. Postoperative change with volume loss on the right. No edema or airspace opacity. Heart upper normal in size with pulmonary vascularity normal. No adenopathy. No bone lesions.  IMPRESSION: Stable  chest tubes on the right without evident pneumothorax. Postoperative change on the right. No edema or airspace opacity. Stable cardiac silhouette.   Electronically Signed   By: Lowella Grip III M.D.   On: 01/24/2021 08:26  Treatments:  NAME: DORSIE, BURICH MEDICAL RECORD NO: [id number] ACCOUNT NO: [id number] DATE OF BIRTH: 1961-02-25 FACILITY: [facility] LOCATION: [facility] PHYSICIAN: Revonda Standard. Roxan Hockey, MD  Operative Report   DATE OF PROCEDURE: 01/22/2021  PREOPERATIVE DIAGNOSIS:  Adenocarcinoma, right upper lobe, clinical stage IIB (T3, N0).  POSTOPERATIVE DIAGNOSIS:  Adenocarcinoma, right upper lobe, clinical stage IIB (T3, N0).  PROCEDURE:  Right thoracotomy, extrapleural right upper lobectomy, lymph node dissection and intercostal nerve blocks levels 3 through 10.  SURGEON:  Revonda Standard. Roxan Hockey, MD  ASSISTANT: Nicholes Rough, PA-C  ANESTHESIA:  General.  FINDINGS:  Large mass in the right upper lobe adherent to the chest wall, extrapleural resection performed, invasion superiorly, posteriorly near the vertebral column.  SURGICAL PATHOLOGY  CASE: [phone]  PATIENT: Christopher Carter  Surgical Pathology Report      Clinical History: right upper lobe lung adenocarcinoma (cm)      FINAL MICROSCOPIC DIAGNOSIS:   A. LUNG, RIGHT UPPER LOBE, LOBECTOMY:  - Poorly differentiated adenocarcinoma, spanning 9 cm.  - Tumor invades pleura and bronchus.  - Vascular and bronchial margins are negative.  - Four of four lymph nodes negative for carcinoma (0/4).  - See oncology table.   B. PLEURAL MARGIN, RIGHT ANTERIOR, EXCISION:  - Atypical necrotic cells, see comment.   C. PLEURAL MARGIN, RIGHT INFERIOR, EXCISION:  - Mixed inflammation.  - No definitive carcinoma identified.   D. PLEURAL MARGIN, RIGHT POSTERIOR, EXCISION:  - Adenocarcinoma.   E. CHEST WALL MASS, PARASPINAL, EXCISION:  - Adenocarcinoma.   F. PLEURAL MARGIN, RIGHT POSTERIOR  #2, EXCISION:  - Chronic inflammation.   G. PLEURAL MARGIN, RIGHT ANTERIOR #2, EXCISION:  - Adenocarcinoma.   H. PLEURAL MARGIN, DEEP, EXCISION:  - Benign soft tissue and skeletal muscle.   I. LYMPH NODE, 9, EXCISION:  - One of one lymph nodes negative for carcinoma (0/1).    J. LYMPH NODE, 7, EXCISION:  - One of one lymph nodes negative for carcinoma (0/1).   K. LYMPH NODE, 10, EXCISION:  - One of one lymph nodes negative for carcinoma (0/1).   L. LYMPH NODE, 10 #2, EXCISION:  - One of one lymph nodes negative for carcinoma (0/1).   M. LYMPH NODE, 10 #3, EXCISION:  - One of one lymph nodes negative for carcinoma (0/1).   N. LYMPH NODE, 11, EXCISION:  - One of one lymph nodes negative for carcinoma (0/1).   O. LYMPH NODE, 12, EXCISION:  - One of one lymph nodes negative for carcinoma (0/1).   P. LYMPH NODE, 11 #2, EXCISION:  - One of one lymph nodes negative for carcinoma (0/1).   Q. LYMPH NODE, 13, EXCISION:  - Benign fibroadipose tissue.   R. LYMPH NODE, 4R, EXCISION:  - One of one lymph nodes negative for  carcinoma (0/1).   S. LYMPH NODE, 4R #2, EXCISION:  - One of one lymph nodes negative for carcinoma (0/1).   T. LYMPH NODE, 2R, EXCISION:  - One of one lymph nodes negative for carcinoma (0/1).   ONCOLOGY TABLE:   LUNG: Resection   Synchronous Tumors: Not applicable  Total Number of Primary Tumors: 1  Procedure: Right upper lobectomy, chest wall excision, pleural margins,  and lymph node biopsies  Specimen Laterality: Right  Tumor Focality: Unifocal  Tumor Site: Right upper lobe  Tumor Size: 9 cm  Histologic Type: Adenocarcinoma  Visceral Pleura Invasion: Present  Direct Invasion of Adjacent Structures: Present, chest wall (part E).  Lymphovascular Invasion: Not identified  Margins:    Closest Margin(s) to Invasive Carcinoma: See below. Right posterior  pleural margin #2 and deep pleural margins are negative.    Margin(s) Involved by Invasive  Carcinoma: Present at right anterior  pleural margin #2.    Margin Status for Non-Invasive Tumor: Not applicable  Treatment Effect: No known presurgical therapy  Regional Lymph Nodes:    Number of Lymph Nodes Involved: 0               Nodal Sites with Tumor: Not applicable    Number of Lymph Nodes Examined: 15            Nodal Sites Examined: 2R, 4R, 7, 9R, 10R, 11R, 12R,  13R.  Distant Metastasis:    Distant Site(s) Involved: Not applicable  Pathologic Stage Classification (pTNM, AJCC 8th Edition): pT4, pN0  Ancillary Studies: Can be performed upon request  Representative Tumor Block: A4  Comment(s): TTF1 is performed on select slides and highlights the  absence or presence of tumor. Part B has atypical necrotic cells which  have weak TTF-1 stating and are favored to be tumor. In the hilum there  is tumor extending up to the edge of a lymph node, but metastatic  disease is not seen.   (v4.2.0.1)      INTRAOPERATIVE DIAGNOSIS:   A. Right upper lobe-present on bronchial margin: "Margin: No carcinoma  identified"  Intraoperative diagnosis rendered by Dr. Melina Copa at 11:45 AM on  01/22/2021.   B. Anterior pleural margin: "Atypical cells present, focally"  Intraoperative diagnosis rendered by Dr. Melina Copa at 12:50 PM on  01/22/2021.   C. Inferior pleural margin: "Crushed cells; favor inflammatory"  Intraoperative diagnosis rendered by Dr. Melina Copa at 12:50 PM on  01/22/2021.   D. Posterior pleural margin: "Positive for malignancy"  Intraoperative diagnosis rendered by Dr. Melina Copa at 12:50 PM on  01/22/2021.   E. Paraspinal chest wall: "Positive for malignancy"  Intraoperative diagnosis rendered by Dr. Melina Copa at 12:50 PM on  01/22/2021.   F. Posterior pleural margin #2: "Negative for malignancy"  Intraoperative diagnosis rendered by Dr. Melina Copa at 1:15 PM on 01/22/2021.   G. Anterior pleural margin #2: "Malignancy present"  Intraoperative  diagnosis rendered by Dr. Melina Copa at 1:15 PM on 01/22/2021.   H. Deep margin: "Negative for malignancy"  Intraoperative diagnosis rendered by Dr. Melina Copa at 1:15 PM on 01/22/2021.   GROSS DESCRIPTION:   A: Specimen: Received fresh for rapid intraoperative consultation  labeled right upper lobe  Specimen integrity: Focally disrupted, with multiple staplelines  Size, weight: The specimen measures 15.6 x 11.9 x 7.4 cm, with a weight  of 423 g.  Pleura: Gray-purple, with mild anthracosis. At the superior aspect of  the specimen, a tan-white, firm possible lesion is identified, and the  overlying pleural surface is focally  disrupted. A small amount of  attached adipose tissue is present surrounding the lesion. The intact  outer surface overlying the lesion is inked black.  Lesion: Sectioning reveals a 9.0 x 8.2 x 6.8 cm tan-red, softened,  focally hemorrhagic lesion.  Margin(s): Lesion measures up to 1.6 cm to the bronchial resection  margins, which are submitted for frozen section analysis.  Hilar vessels: Grossly unremarkable  Nonneoplastic parenchyma: Red-brown and slightly congested  Lymph nodes: Identified at the hilum are 2 tan-gray, anthracotic  possible lymph nodes measuring 0.4 cm each.  Block Summary:  11 blocks submitted  1 = bronchial margin frozen section remnant  2 = vascular margins  3-5 = lesion to pleura and disruptedsurface  6 = lesion  7 and 8 = lesion closest to hilum  9 and 10 = uninvolved parenchyma  11 = 2 possible lymph nodes    B: The specimen is received fresh for rapid intraoperative consultation  and consists of a 1.7 x 1.1 x 0.2 cm piece of tan-red soft tissue. The  specimen is entirely submitted for frozen section analysis, and the  remnant is resubmitted for permanent in 1 cassette.   C: The specimen is received fresh for rapid intraoperative consultation  and consists of a 2.5 x 0.9 x 0.4 cm piece of red-yellow soft tissue.  The specimen is  entirely submitted for frozen section analysis, and the  remnant is resubmitted for permanent in 1 cassette.   D: The specimen is received fresh for rapid intraoperative consultation  and consists of a 4.0 x 1.1 x 0.3 cm piece of tan-red soft tissue. The  specimen is entirely submitted for frozen section analysis, and the  remnant is resubmitted for permanent in 1 cassette.   E: The specimen is received fresh for rapid intraoperative consultation  and consists of a 3.4 x 1.8 x 0.8 cm aggregate of tan-red soft tissue.  Representative sections are submitted for frozen section analysis, and  the specimen is entirely submitted in 3 cassettes.  1 = frozen section remnant  2 and 3 = remainder of tissue   F: The specimen is received fresh for rapid intraoperative consultation  and consists of a 1.2 x 1.2 x 0.3 cm aggregate of tan-red soft tissue.  The specimen is entirely submitted for frozen section analysis, and the  remnant is resubmitted for permanent in 1 cassette.   G: The specimen is received fresh for rapid intraoperative consultation  and consists of a 2.7 x 0.9 x 0.2 cm piece of tan-red soft tissue. The  specimen is entirely submitted for frozen section analysis, and the  remnant is resubmitted for permanent in 1 cassette.   H: The specimen is received fresh for rapid intraoperative consultation  and consists of a 2.0 x 1.2 x 0.4 cm piece of tan-red soft tissue. The  specimen is entirely submitted for frozen section analysis, and the  remnant is resubmitted for permanent in 1 cassette.   I: The specimen is received fresh and consists of a 1.0 x 0.8 x 0.3 cm  tan-gray, anthracotic possible lymph node. The specimen is entirely  submitted in one cassette.   J: The specimen is received fresh and consists of a 2.5 x 1.4 x 0.4 cm  tan-gray, anthracotic possible lymph node. The specimen is entirely  submitted in 1 cassette.   K: The specimen is received fresh and consists of a  0.7 x 0.4 x 0.3 cm  tan-gray, anthracotic possible lymph node. Specimen is entirely  submitted in 1 cassette.   L: The specimen is received fresh and consists of a 1.5 x 0.9 x 0.5 cm  tan-pink possible lymph node. The specimen is bisected and entirely  submitted in 1 cassette.   M: The specimen is received fresh and consists of a 1.1 x 1.1 x 0.4 cm  tan-gray, anthracotic possible lymph node. The specimen is entirely  submitted in one cassette.   N: The specimen is received fresh and consists of a 1.6 x 1.0 x 0.6 cm  tan-gray, anthracotic possible lymph node. The specimen is bisected and  entirely submitted in one cassette.   O: The specimen is received fresh and consists of a 1.5 x 1.0 x 0.3 cm  aggregate of tan-gray soft tissue. The specimen is entirely submitted  in 1 cassette.   P: The specimen is received fresh and consists of a 1.5 x 0.6 x 0.4 cm  aggregate of tan-gray, anthracotic soft tissue. The specimen is  entirely submitted in 1 cassette.   Q: The specimen is received fresh and consists of a 0.8 x 0.7 x 0.3 cm  tan-gray, anthracotic possible lymph node. The specimen is entirely  submitted in 1 cassette.   R: The specimen is received fresh and consists of a 1.8 x 1.3 x 0.8 cm  tan-red possible lymph node. The specimen is bisected and entirely  submitted in 1 cassette.   S: The specimen is received fresh and consists of a 1.5 x 0.9 x 0.6 cm  tan-red possible lymph node. The specimen is bisected and entirely  submitted in 1 cassette.   T: The specimen is received fresh and consists of a 1.3 x 1.1 x 0.6 cm  tan-red possible lymph node. The specimen is bisected and entirely  submitted in 1 cassette. Craig Staggers 01/23/2021)    Final Diagnosis performed by Vicente Males, MD.  Electronically signed  01/26/2021  Technical component performed at [facility]. [facility], [place] [address], [place], [place] [address].  Professional component performed at [facility],  [place] [address], [place], [place] [address].  Immunohistochemistry Technical component (if applicable) was performed  at [facility]. [address], [place],  [place], [place] [address].  IMMUNOHISTOCHEMISTRY DISCLAIMER (if applicable):  Some of these immunohistochemical stains may have been developed and the  performance characteristics determine by [facility]. Some  may not have been cleared or approved by the U.S. Food and Drug  Administration. The FDA has determined that such clearance or approval  is not necessary. This test is used for clinical purposes. It should not  be regarded as investigational or for research. This laboratory is  certified under the [place]  (CLIA-88) as qualified to perform high complexity clinical laboratory  testing. The controls stained ap Discharge Exam: Blood pressure (!) 158/97, pulse 82, temperature 98.8 F (37.1 C), temperature source Oral, resp. rate 20, height 5\' 10"  (1.778 m), weight 84.8 kg, SpO2 98 %.    General appearance: alert, cooperative and no distress Heart: regular rate and rhythm Lungs: clear to auscultation bilaterally Abdomen: benign Extremities: no edema or calf tenderness Wound: incis healing well   Disposition:  Discharge disposition: 01-Home or Self Care       Discharge Instructions    Discharge patient   Complete by: As directed    Discharge disposition: 01-Home or Self Care   Discharge patient date: 01/26/2021     Allergies as of 01/26/2021  Reactions   Oxycodone Hcl Other (See Comments)   Pt reported "seeing things" and " feeling unusual."   Bupropion Nausea And Vomiting      Medication List    STOP taking these medications   HYDROcodone-homatropine 5-1.5 MG/5ML syrup Commonly known as: Hycodan   Potassium Chloride ER 20 MEQ Tbcr   potassium chloride SA 20 MEQ tablet Commonly known as: KLOR-CON      TAKE these medications   acetaminophen 500 MG tablet Commonly known as: TYLENOL Take 500 mg by mouth 2 (two) times daily as needed for moderate pain or headache.   amLODipine 10 MG tablet Commonly known as: NORVASC Take 1 tablet (10 mg total) by mouth daily. What changed:   medication strength  how much to take   aspirin EC 81 MG tablet Take 81 mg by mouth daily. Swallow whole.   BIOFREEZE EX Apply 1 application topically daily as needed (pain).   CoQ10 100 MG Caps Take 100 mg by mouth daily.   HAWTHORNE BERRY PO Take 1 tablet by mouth daily.   irbesartan 300 MG tablet Commonly known as: AVAPRO Take 300 mg by mouth daily.   latanoprost 0.005 % ophthalmic solution Commonly known as: XALATAN Place 1 drop into both eyes at bedtime.   OVER THE COUNTER MEDICATION Take 1 tablet by mouth 3 (three) times daily. Jointprin otc supplement   QUEtiapine 50 MG tablet Commonly known as: SEROQUEL Take 50 mg by mouth at bedtime.   RED YEAST RICE PO Take 2 tablets by mouth daily.   sertraline 100 MG tablet Commonly known as: ZOLOFT Take 100 mg by mouth daily.   traMADol 50 MG tablet Commonly known as: ULTRAM Take 1 tablet (50 mg total) by mouth every 6 (six) hours as needed (mild pain).   TURMERIC PO Take 1 tablet by mouth daily.   vitamin B-12 1000 MCG tablet Commonly known as: CYANOCOBALAMIN Take 1,000 mcg by mouth daily.   vitamin C 1000 MG tablet Take 1,000 mg by mouth daily.        Signed: John Giovanni PA-C 01/26/2021, 10:43 AM

## 2021-01-24 NOTE — Progress Notes (Addendum)
      [address]       [place] [address]             [phone]      2 Days Post-Op Procedure(s) (LRB): VIDEO ASSISTED THORACOSCOPY (VATS)/RIGHT UPPER LOBECTOMY (Right) THORACOTOMY (Right) INTERCOSTAL NERVE BLOCK (Right) LYMPH NODE DISSECTION (Right) Subjective: Feels okay this morning, pain is well controlled  Objective: Vital signs in last 24 hours: Temp:  [97.7 F (36.5 C)-97.9 F (36.6 C)] 97.7 F (36.5 C) (05/18 0408) Pulse Rate:  [75-95] 79 (05/18 0408) Cardiac Rhythm: Sinus tachycardia (05/17 1901) Resp:  [14-20] 20 (05/18 0408) BP: (129-135)/(76-94) 130/76 (05/18 0408) SpO2:  [92 %-94 %] 93 % (05/18 0408)     Intake/Output from previous day: 05/17 0701 - 05/18 0700 In: -  Out: 340 [Chest Tube:340] Intake/Output this shift: No intake/output data recorded.  General appearance: alert, cooperative and no distress Heart: regular rate and rhythm, S1, S2 normal, no murmur, click, rub or gallop Lungs: clear to auscultation bilaterally Abdomen: soft, non-tender; bowel sounds normal; no masses,  no organomegaly Extremities: extremities normal, atraumatic, no cyanosis or edema Wound: clean and dry  Lab Results: Recent Labs    01/23/21 0136 01/24/21 0142  WBC 14.1* 9.8  HGB 11.5* 11.0*  HCT 35.2* 34.1*  PLT 281 271   BMET:  Recent Labs    01/23/21 0136 01/24/21 0142  NA 136 135  K 3.8 3.5  CL 100 100  CO2 27 28  GLUCOSE 135* 121*  BUN 10 15  CREATININE 0.66 0.61  CALCIUM 8.4* 8.6*    PT/INR: No results for input(s): LABPROT, INR in the last 72 hours. ABG    Component Value Date/Time   PHART 7.455 (H) 01/18/2021 1504   HCO3 22.1 01/18/2021 1504   TCO2 24 01/22/2021 0609   ACIDBASEDEF 1.3 01/18/2021 1504   O2SAT 97.5 01/18/2021 1504   CBG (last 3)  Recent Labs    01/22/21 1649 01/22/21 2145 01/23/21 0605  GLUCAP 179* 207* 143*    Assessment/Plan: S/P Procedure(s) (LRB): VIDEO ASSISTED THORACOSCOPY (VATS)/RIGHT  UPPER LOBECTOMY (Right) THORACOTOMY (Right) INTERCOSTAL NERVE BLOCK (Right) LYMPH NODE DISSECTION (Right)  1. CV- NSR, + HTN- restart home Norvasc, Avapro 2. Pulm- 1+ intermittent air leak on water seal, CXR w/o pneumothorax and stable compared to yesterday's study- may be able to remove today.  3. Renal- creatinine 0.61, lytes okay 4. Lovenox for DVT prophylaxis  Plan: Chest tube with 1+ intermittent air leak on water seal, + fluid wave. Will discuss removal with Dr. Roxan Hockey.     LOS: 2 days    Elgie Collard 01/24/2021 Patient seen and examined, agree with above No air leak when I examined him- will dc chest tube this AM, leave pigtail in one more day Still with some nausea, pain better today  Remo Lipps C. Roxan Hockey, MD [facility] [phone]

## 2021-01-25 ENCOUNTER — Inpatient Hospital Stay (HOSPITAL_COMMUNITY): Payer: 59

## 2021-01-25 IMAGING — DX DG CHEST 1V SAME DAY
1 series · 1 of 1 positions shown · non-contrast
Comparison: [DATE]

CLINICAL DATA: Apical pneumothorax, chest tube removal

EXAM:
CHEST - 1 VIEW SAME DAY

[chest]
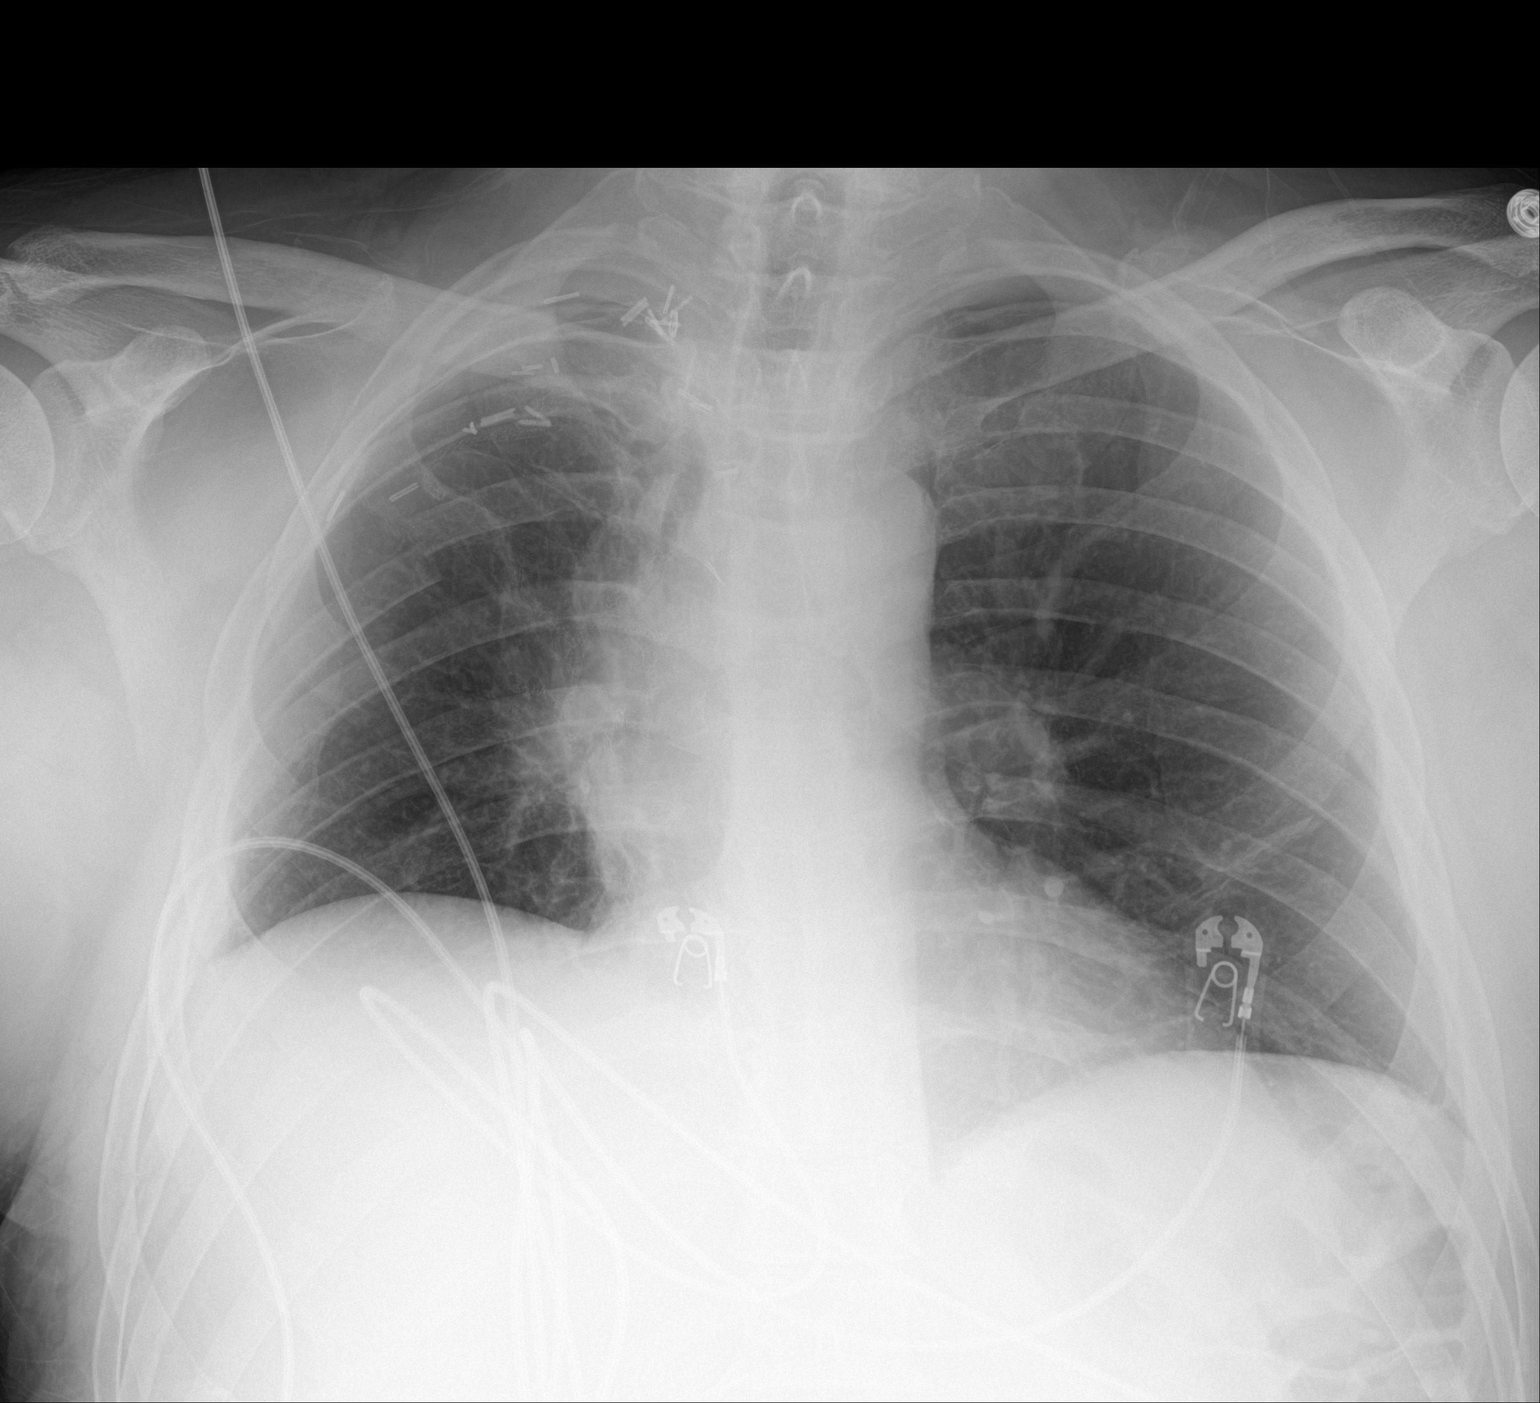

[1 of 1 positions shown; findings below may reference images not displayed]

FINDINGS: Interval right pigtail chest tube removal. Stable tiny right apical
pneumothorax. Stable postoperative changes of the right lung and
hilum. Minor left base atelectasis. Normal heart size and
vascularity. Trachea midline.
IMPRESSION: Right pigtail chest tube removal. Stable trace right apical
pneumothorax and postoperative findings.

## 2021-01-25 IMAGING — DX DG CHEST 1V PORT
1 series · 1 of 1 positions shown · non-contrast
Comparison: [DATE].

CLINICAL DATA: Chest tube.

EXAM:
PORTABLE CHEST 1 VIEW

[chest ap]
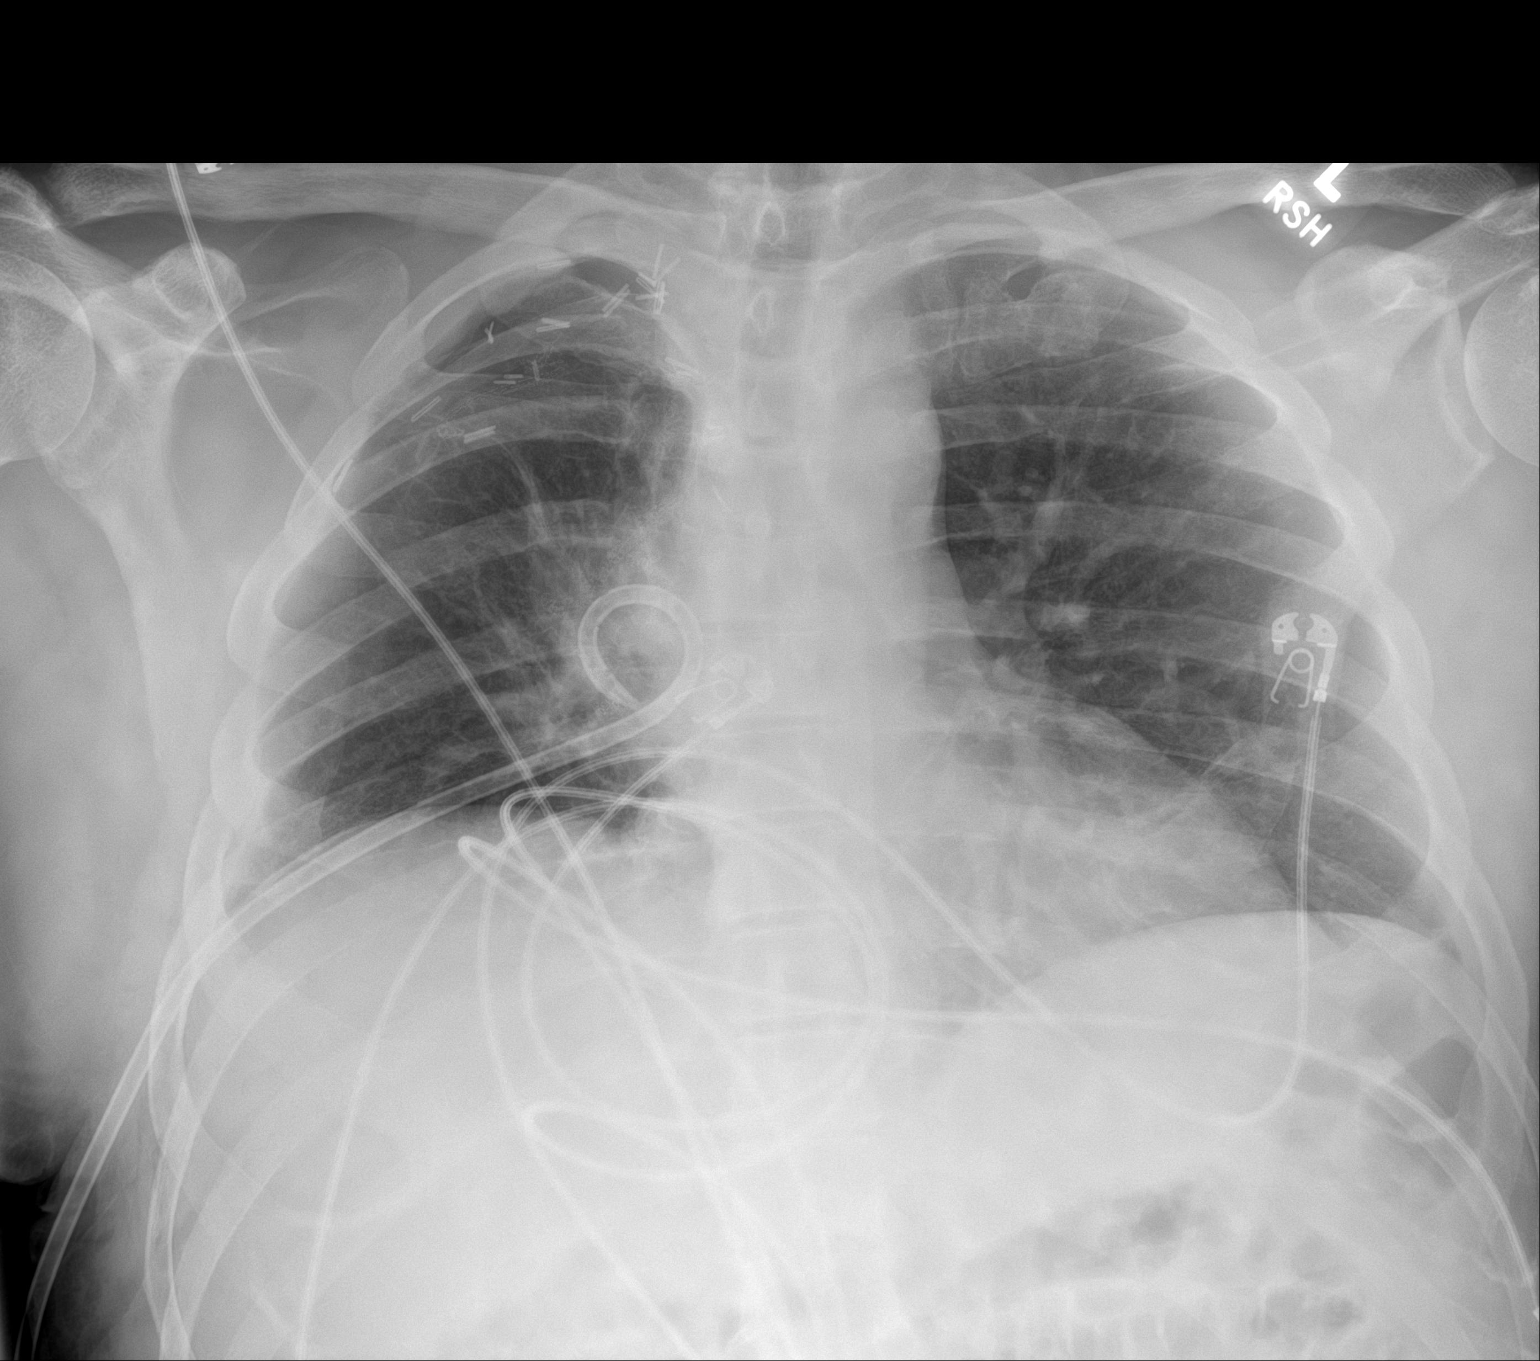

[1 of 1 positions shown; findings below may reference images not displayed]

FINDINGS: Interim removal of right upper chest tube. Right pigtail chest tube
in stable position. Small right apical pneumothorax appears to be
present. Postsurgical changes right lung. Bibasilar atelectasis. No
pleural effusion. Heart size stable.
IMPRESSION: 1. Interim removal of right upper chest tube. Right pigtail chest
tube in stable position. Small right apical pneumothorax appears to
be present on today's exam.

2.  Postsurgical changes right lung.  Bibasilar atelectasis.

Critical Value/emergent results were called by telephone at the time
of interpretation on [DATE] at [DATE] to nurse HADIA , who
verbally acknowledged these results.

## 2021-01-25 MED ORDER — PANTOPRAZOLE SODIUM 40 MG PO TBEC
40.0000 mg | DELAYED_RELEASE_TABLET | Freq: Every day | ORAL | Status: DC
  Administered 2021-01-25: 40 mg via ORAL
  Filled 2021-01-25: qty 1

## 2021-01-25 MED ORDER — ALUM & MAG HYDROXIDE-SIMETH 200-200-20 MG/5ML PO SUSP
15.0000 mL | Freq: Four times a day (QID) | ORAL | Status: DC | PRN
  Administered 2021-01-25 (×2): 15 mL via ORAL
  Filled 2021-01-25 (×2): qty 30

## 2021-01-25 NOTE — Progress Notes (Signed)
3 Days Post-Op Procedure(s) (LRB): VIDEO ASSISTED THORACOSCOPY (VATS)/RIGHT UPPER LOBECTOMY (Right) THORACOTOMY (Right) INTERCOSTAL NERVE BLOCK (Right) LYMPH NODE DISSECTION (Right) Subjective: C/o stomach discomfort, loose stool this AM  Objective: Vital signs in last 24 hours: Temp:  [98 F (36.7 C)-98.8 F (37.1 C)] 98.8 F (37.1 C) (05/19 0725) Pulse Rate:  [92-97] 95 (05/19 0725) Cardiac Rhythm: Normal sinus rhythm;Bundle branch block (05/19 0714) Resp:  [19-25] 25 (05/19 0725) BP: (141-157)/(83-96) 152/96 (05/19 0255) SpO2:  [90 %-94 %] 94 % (05/19 0725)  Hemodynamic parameters for last 24 hours:    Intake/Output from previous day: 05/18 0701 - 05/19 0700 In: 150 [P.O.:120; I.V.:30] Out: 1400 [Urine:1400] Intake/Output this shift: No intake/output data recorded.  General appearance: alert, cooperative and no distress Neurologic: intact Heart: regular rate and rhythm Lungs: diminished breath sounds right base Abdomen: mildly distended, + BS Wound: clean and dry  Lab Results: Recent Labs    01/23/21 0136 01/24/21 0142  WBC 14.1* 9.8  HGB 11.5* 11.0*  HCT 35.2* 34.1*  PLT 281 271   BMET:  Recent Labs    01/23/21 0136 01/24/21 0142  NA 136 135  K 3.8 3.5  CL 100 100  CO2 27 28  GLUCOSE 135* 121*  BUN 10 15  CREATININE 0.66 0.61  CALCIUM 8.4* 8.6*    PT/INR: No results for input(s): LABPROT, INR in the last 72 hours. ABG    Component Value Date/Time   PHART 7.455 (H) 01/18/2021 1504   HCO3 22.1 01/18/2021 1504   TCO2 24 01/22/2021 0609   ACIDBASEDEF 1.3 01/18/2021 1504   O2SAT 97.5 01/18/2021 1504   CBG (last 3)  Recent Labs    01/22/21 1649 01/22/21 2145 01/23/21 0605  GLUCAP 179* 207* 143*    Assessment/Plan: S/P Procedure(s) (LRB): VIDEO ASSISTED THORACOSCOPY (VATS)/RIGHT UPPER LOBECTOMY (Right) THORACOTOMY (Right) INTERCOSTAL NERVE BLOCK (Right) LYMPH NODE DISSECTION (Right) -POD # 3 Right upper lobectomy CXR shows tiny  apical pneumothorax, no air leak- dc pigtail C/o stomach discomfort and diarrhea this AM- will start protonix and maalox Will keep PCA today as a backup in case pain is an issue and I'm worried about his ability to take PO pain meds. He is using it sparingly Path still pending   LOS: 3 days    [name] 01/25/2021

## 2021-01-25 NOTE — Progress Notes (Signed)
Chest tube removed tolerated well. X-ray dept. made aware about chest x-ray post chest tube removal.

## 2021-01-25 NOTE — Anesthesia Postprocedure Evaluation (Signed)
Anesthesia Post Note  Patient: Christopher Carter  Procedure(s) Performed: VIDEO ASSISTED THORACOSCOPY (VATS)/RIGHT UPPER LOBECTOMY (Right Chest) THORACOTOMY (Right ) INTERCOSTAL NERVE BLOCK (Right Chest) LYMPH NODE DISSECTION (Right Chest)     Patient location during evaluation: PACU Anesthesia Type: General Level of consciousness: awake and alert Pain management: pain level controlled Vital Signs Assessment: post-procedure vital signs reviewed and stable Respiratory status: spontaneous breathing, nonlabored ventilation, respiratory function stable and patient connected to nasal cannula oxygen Cardiovascular status: blood pressure returned to baseline and stable Postop Assessment: no apparent nausea or vomiting Anesthetic complications: no   No complications documented.  Last Vitals:  Vitals:   01/25/21 1200 01/25/21 1722  BP:    Pulse:    Resp: 15 12  Temp:    SpO2: 94% 95%    Last Pain:  Vitals:   01/25/21 1722  TempSrc:   PainSc: 2                  Tiyona Desouza

## 2021-01-26 ENCOUNTER — Inpatient Hospital Stay (HOSPITAL_COMMUNITY): Payer: 59

## 2021-01-26 LAB — SURGICAL PATHOLOGY

## 2021-01-26 IMAGING — CR DG CHEST 2V
2 series · 2 of 2 positions shown · non-contrast
Comparison: [DATE]

CLINICAL DATA: Postoperative lobectomy

EXAM:
CHEST - 2 VIEW

[chest pa]
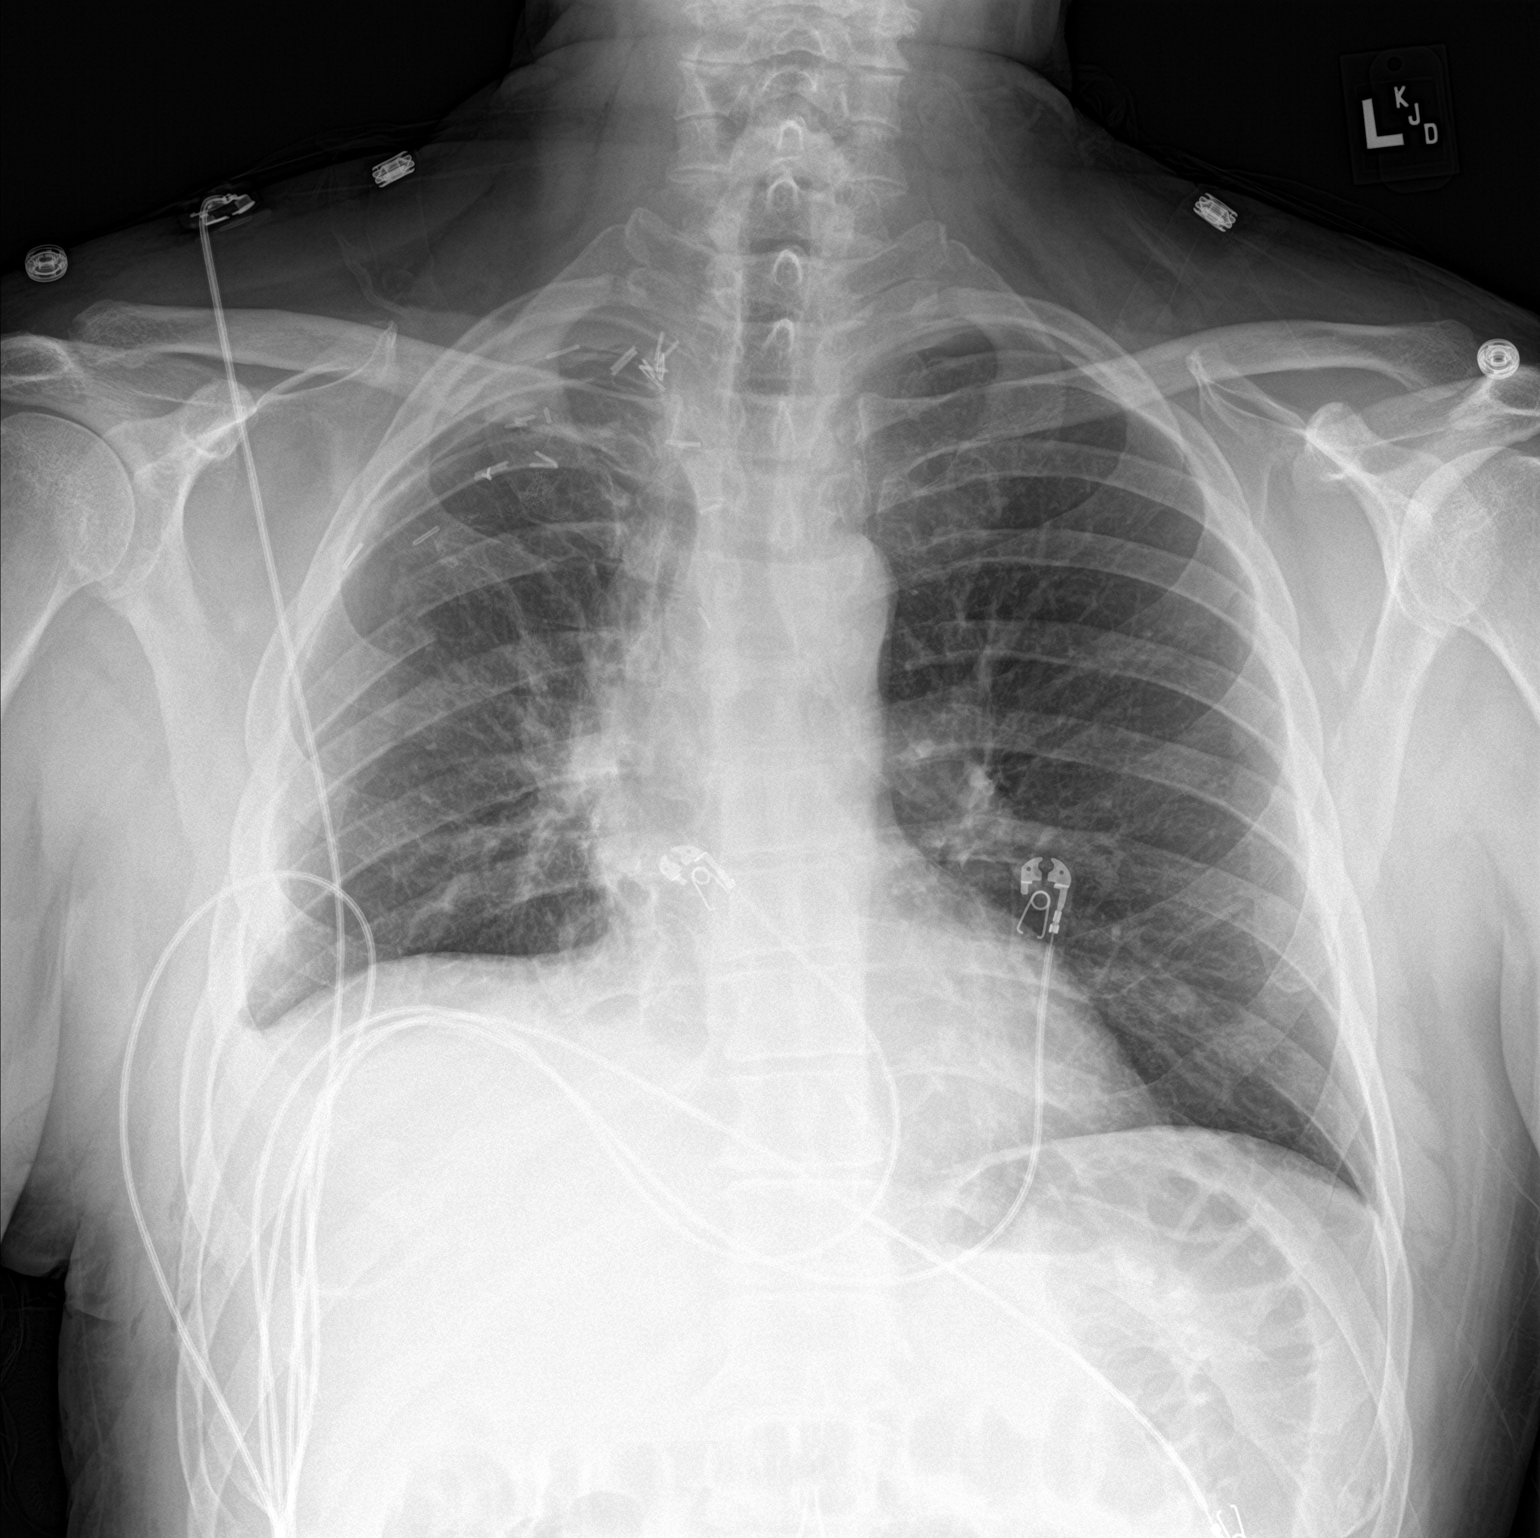

[chest lat]
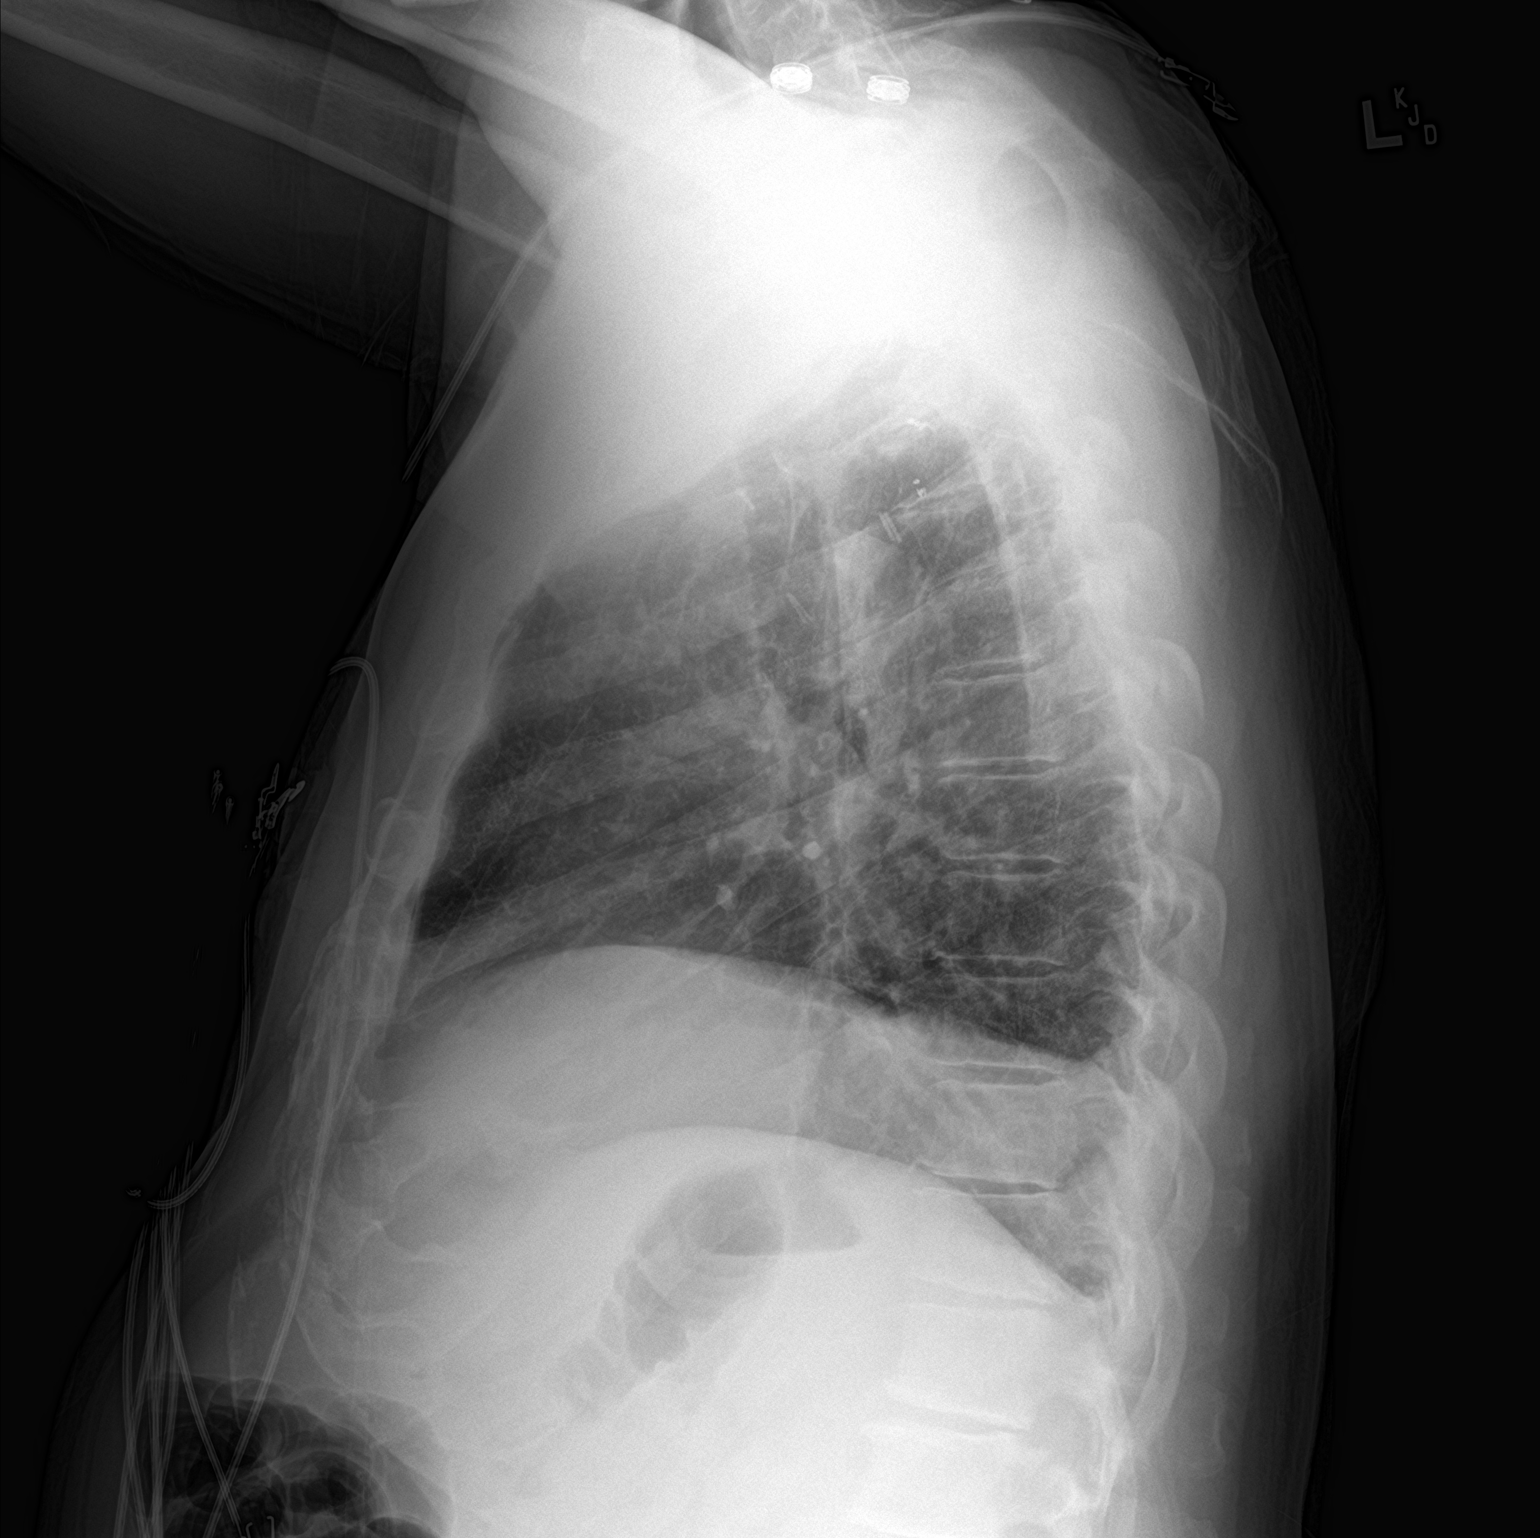

[2 of 2 positions shown; findings below may reference images not displayed]

FINDINGS: Postoperative change noted on the right with stable volume loss.
Equivocal right pleural effusion no edema or airspace opacity. Heart
size and pulmonary vascular normal. No adenopathy. No bone lesions.
IMPRESSION: Postoperative change on the right with volume loss. Equivocal right
pleural effusion. Lungs otherwise clear. Heart size within normal
limits.

## 2021-01-26 MED ORDER — AMLODIPINE BESYLATE 10 MG PO TABS
10.0000 mg | ORAL_TABLET | Freq: Every day | ORAL | Status: DC
  Administered 2021-01-26: 10 mg via ORAL
  Filled 2021-01-26: qty 1

## 2021-01-26 MED ORDER — AMLODIPINE BESYLATE 10 MG PO TABS: 10.0000 mg | ORAL_TABLET | Freq: Every day | ORAL | 1 refills | Status: DC

## 2021-01-26 MED ORDER — TRAMADOL HCL 50 MG PO TABS: 50.0000 mg | ORAL_TABLET | Freq: Four times a day (QID) | ORAL | 0 refills | Status: DC | PRN

## 2021-01-26 NOTE — Progress Notes (Signed)
Discharged home accompanied by friend. Belongings taken home.

## 2021-01-26 NOTE — Progress Notes (Signed)
Dilaudid pca 12 ml wasted in the stericycle as witnessed by [employer].

## 2021-01-26 NOTE — Discharge Instructions (Signed)
[facility] office [phone]   Thoracotomy, Care After This sheet gives you information about how to care for yourself after your procedure. Your doctor may also give you more specific instructions. If you have problems or questions, contact your doctor. What can I expect after the procedure? After the procedure, it is common to have:  Redness, pain, and swelling around the cut from surgery (incision).  Fluid or blood coming from the cut from surgery.  Pain when you breathe in.  Trouble pooping (constipation).  Being tired.  Lack of interest in eating and drinking.  Trouble sleeping. Follow these instructions at home: Preventing lung infection  Take deep breaths or do breathing exercises to prevent lung infection (pneumonia) as told by your doctor.  Cough often. Coughing helps to clear mucus and open your lungs. If coughing hurts, hold a pillow against your chest or place both hands flat on top of your cut when you cough. This may help with discomfort.  Use an incentive spirometer as told by your doctor. This is a tool that measures how well you fill your lungs with each breath.  Do lung therapy (pulmonary rehabilitation) as told by your doctor.   Medicines  Take over-the-counter and prescription medicines only as told by your doctor.  If you have pain, take medicine to help the pain before it gets very bad. This will help you breathe and cough more easily.  If you were prescribed an antibiotic medicine, take it as told by your doctor. Do not stop taking the antibiotic even if you start to feel better.  Ask your doctor if the medicine prescribed to you: ? Requires you to avoid driving or using machinery. ? Can cause trouble pooping. You may need to take these actions to prevent or treat trouble pooping:  Drink enough fluid to keep your pee (urine) pale yellow.  Take over-the-counter or prescription medicines.  Eat foods that are high in fiber. These include beans, whole  grains, and fresh fruits and vegetables.  Limit foods that are high in fat and sugar. These include fried or sweet foods.   Activity  Rest as told by your doctor. Ask your doctor what activities are safe for you.  Do not travel by airplane for 2 weeks after your chest tube is removed, or until your doctor says that this is safe.  Do not lift anything that is heavier than 10 lb (4.5 kg), or the limit that you are told, until your doctor says that it is safe.   Incision care  Follow instructions from your doctor about how to take care of your cut from surgery. Make sure you: ? Wash your hands with soap and water for at least 20 seconds before and after you change your bandage (dressing). If you cannot use soap and water, use hand sanitizer. ? Change your bandage as told by your doctor. ? Leave stitches (sutures), skin glue, or skin tape (adhesive) strips in place. They may need to stay in place for 2 weeks or longer. If tape strips get loose and curl up, you may trim the loose edges. Do not remove tape strips completely unless your doctor says it is okay.  Keep your bandage dry.  Check the area around your cut from surgery every day for signs of infection. Check for: ? More redness, swelling, or pain. ? More fluid or blood. ? Warmth. ? Pus or a bad smell.  After your bandage has been taken off, use soap and water to gently  wash your cut from surgery. Do not use anything else to clean your cut unless your doctor tells you to.   Lifestyle  You may shower  Do not use any products that contain nicotine or tobacco, such as cigarettes, e-cigarettes, and chewing tobacco. If you need help quitting, ask your doctor.  Avoid secondhand smoke.  Eat a healthy diet as told by your doctor. A healthy diet includes: ? Fresh fruits and vegetables. ? Whole grains. ? Low-fat (lean) proteins. General instructions  Wear compression stockings as told by your doctor.  If you have a chest tube, care  for it as told by your doctor.  Keep all follow-up visits as told by your doctor. This is important. Contact a doctor if:  You have any signs of infection around your cut from surgery, such as: ? More redness, swelling, or pain. ? More fluid or blood. ? Warmth. ? Pus or a bad smell.  You have a fever or chills.  Your heartbeat seems uneven.  You vomit or feel like vomiting.  You have pain in your muscles or you feel weak.  You feel like you are about to faint.  You have very bad pain or pain that gets worse even when you take medicine. Get help right away if:  You get a rash.  You have trouble breathing.  You develop signs of a blood clot, such as: ? You have trouble talking. ? You are confused. ? You cannot see well. ? You are not able to move. ? You lose feeling in your face, arms, or legs (feel numb).  You faint.  You have a very bad headache all of a sudden.  You have chest pain. These symptoms may be an emergency. Do not wait to see if the symptoms will go away. Get medical help right away. Call your local emergency services (911 in the [place] Do not drive yourself to the hospital. Summary  After the procedure, it is common to have pain, redness, fluid, or blood around the area that was cut.  Take deep breaths, do breathing exercises, and cough often. This helps prevent lung infection.  Do not travel by airplane for 2 weeks after your chest tube is removed, or until your doctor says that this is safe.  Check the area around your cut from surgery every day for signs of infection.  Eat a healthy diet. This includes fresh fruits and vegetables, whole grains, and low-fat (lean) proteins. This information is not intended to replace advice given to you by your health care provider. Make sure you discuss any questions you have with your health care provider. Document Revised: 06/08/2019 Document Reviewed: 06/08/2019 Elsevier Patient Education  [place].

## 2021-01-26 NOTE — Progress Notes (Addendum)
[address]       [place] [address]             [phone]      4 Days Post-Op Procedure(s) (LRB): VIDEO ASSISTED THORACOSCOPY (VATS)/RIGHT UPPER LOBECTOMY (Right) THORACOTOMY (Right) INTERCOSTAL NERVE BLOCK (Right) LYMPH NODE DISSECTION (Right) Subjective: Feels much better, GI sx resolved   Objective: Vital signs in last 24 hours: Temp:  [98.3 F (36.8 C)-99 F (37.2 C)] 98.8 F (37.1 C) (05/20 0719) Pulse Rate:  [81-91] 82 (05/20 0719) Cardiac Rhythm: Normal sinus rhythm (05/19 1900) Resp:  [12-20] 20 (05/20 0719) BP: (117-158)/(85-98) 158/97 (05/20 0719) SpO2:  [93 %-98 %] 98 % (05/20 0719) FiO2 (%):  [0 %-94 %] 0 % (05/19 1722)  Hemodynamic parameters for last 24 hours:    Intake/Output from previous day: 05/19 0701 - 05/20 0700 In: 500 [P.O.:500] Out: 1 [Stool:1] Intake/Output this shift: No intake/output data recorded.  General appearance: alert, cooperative and no distress Heart: regular rate and rhythm Lungs: clear to auscultation bilaterally Abdomen: benign Extremities: no edema or calf tenderness Wound: incis healing well  Lab Results: Recent Labs    01/24/21 0142  WBC 9.8  HGB 11.0*  HCT 34.1*  PLT 271   BMET:  Recent Labs    01/24/21 0142  NA 135  K 3.5  CL 100  CO2 28  GLUCOSE 121*  BUN 15  CREATININE 0.61  CALCIUM 8.6*    PT/INR: No results for input(s): LABPROT, INR in the last 72 hours. ABG    Component Value Date/Time   PHART 7.455 (H) 01/18/2021 1504   HCO3 22.1 01/18/2021 1504   TCO2 24 01/22/2021 0609   ACIDBASEDEF 1.3 01/18/2021 1504   O2SAT 97.5 01/18/2021 1504   CBG (last 3)  No results for input(s): GLUCAP in the last 72 hours.  Meds Scheduled Meds: . acetaminophen  1,000 mg Oral Q6H   Or  . acetaminophen (TYLENOL) oral liquid 160 mg/5 mL  1,000 mg Oral Q6H  . amLODipine  5 mg Oral Daily  . aspirin EC  81 mg Oral Daily  . enoxaparin (LOVENOX) injection  40 mg Subcutaneous Daily   . HYDROmorphone   Intravenous Q4H  . irbesartan  300 mg Oral Daily  . ketorolac  15 mg Intravenous Q6H  . pantoprazole  40 mg Oral Q1200  . sertraline  100 mg Oral Daily   Continuous Infusions: PRN Meds:.alum & mag hydroxide-simeth, diphenhydrAMINE **OR** diphenhydrAMINE, HYDROmorphone, naloxone **AND** sodium chloride flush, ondansetron (ZOFRAN) IV, QUEtiapine, traMADol  Xrays DG Chest 1V REPEAT Same Day  Result Date: 01/25/2021 CLINICAL DATA:  Apical pneumothorax, chest tube removal EXAM: CHEST - 1 VIEW SAME DAY COMPARISON:  01/25/2021 FINDINGS: Interval right pigtail chest tube removal. Stable tiny right apical pneumothorax. Stable postoperative changes of the right lung and hilum. Minor left base atelectasis. Normal heart size and vascularity. Trachea midline. IMPRESSION: Right pigtail chest tube removal. Stable trace right apical pneumothorax and postoperative findings. Electronically Signed   By: Jerilynn Mages.  Shick M.D.   On: 01/25/2021 13:01   DG Chest Port 1 View  Result Date: 01/25/2021 CLINICAL DATA:  Chest tube. EXAM: PORTABLE CHEST 1 VIEW COMPARISON:  01/24/2021. FINDINGS: Interim removal of right upper chest tube. Right pigtail chest tube in stable position. Small right apical pneumothorax appears to be present. Postsurgical changes right lung. Bibasilar atelectasis. No pleural effusion. Heart size stable. IMPRESSION: 1. Interim removal of right upper chest tube. Right pigtail chest tube  in stable position. Small right apical pneumothorax appears to be present on today's exam. 2.  Postsurgical changes right lung.  Bibasilar atelectasis. Critical Value/emergent results were called by telephone at the time of interpretation on 01/25/2021 at 6:41 am to nurse Jocelyn , who verbally acknowledged these results. Electronically Signed   By: Marcello Moores  Register   On: 01/25/2021 06:46    Assessment/Plan: S/P Procedure(s) (LRB): VIDEO ASSISTED THORACOSCOPY (VATS)/RIGHT UPPER LOBECTOMY  (Right) THORACOTOMY (Right) INTERCOSTAL NERVE BLOCK (Right) LYMPH NODE DISSECTION (Right)  1 Tmax 99, VSS sBP 110's to 150's- will increase norvasc to 10, cont avapro- may need further outpatient management per primary 2 sats good on RA 3 CXR mostly clear, minor atx, no significant pntx 4 no new labs 5 path pending 6 GI sx resolved 7 stable for D/C   LOS: 4 days    John Giovanni PA-C Pager [phone] 01/26/2021 Patient seen and examined, agree with above Home today Will need XRT, chemo- will arrange as outpatient  Remo Lipps C. Roxan Hockey, MD [facility] [phone]

## 2021-02-01 ENCOUNTER — Other Ambulatory Visit: Payer: Self-pay

## 2021-02-01 ENCOUNTER — Encounter (INDEPENDENT_AMBULATORY_CARE_PROVIDER_SITE_OTHER): Payer: Self-pay

## 2021-02-01 ENCOUNTER — Telehealth: Payer: Self-pay | Admitting: Emergency Medicine

## 2021-02-01 ENCOUNTER — Other Ambulatory Visit: Payer: Self-pay | Admitting: *Deleted

## 2021-02-01 ENCOUNTER — Telehealth: Payer: Self-pay | Admitting: *Deleted

## 2021-02-01 DIAGNOSIS — Z902 Acquired absence of lung [part of]: Secondary | ICD-10-CM

## 2021-02-01 DIAGNOSIS — Z4802 Encounter for removal of sutures: Secondary | ICD-10-CM

## 2021-02-01 NOTE — Telephone Encounter (Signed)
Left detailed message for Sherian Maroon stating that the patient has never been seen in our office and has been following up with Dr. Roxan Hockey. Provided her with his name and contact info. Will close encounter.

## 2021-02-01 NOTE — Progress Notes (Signed)
The proposed treatment discussed in cancer conference is for discussion purpose only and is not a binding recommendation. The patient was not physically examined nor present for their treatment options. Therefore, final treatment plans cannot be decided.  ?

## 2021-02-01 NOTE — Telephone Encounter (Signed)
I called Christopher Carter to update him on cancer conference discussion per Dr. Julien Nordmann.  His plan of care is to see rad onc in 3 weeks and to send molecular and PDL 1 testing to [facility]. I will notify [facility] to send and complete referral to rad onc. He verbalized understanding.

## 2021-02-09 ENCOUNTER — Telehealth: Payer: Self-pay

## 2021-02-09 ENCOUNTER — Encounter (HOSPITAL_COMMUNITY): Payer: Self-pay | Admitting: Internal Medicine

## 2021-02-09 NOTE — Telephone Encounter (Signed)
Faxed opt notes and H&P to [facility] for 01/05/21. Nothing further needed at this time.

## 2021-02-13 ENCOUNTER — Other Ambulatory Visit: Payer: Self-pay

## 2021-02-13 ENCOUNTER — Other Ambulatory Visit: Payer: Self-pay | Admitting: Thoracic Surgery (Cardiothoracic Vascular Surgery)

## 2021-02-13 ENCOUNTER — Ambulatory Visit
Admission: RE | Admit: 2021-02-13 | Discharge: 2021-02-13 | Disposition: A | Discharge status: Home / Self Care | Payer: 59 | Source: Ambulatory Visit | Attending: Thoracic Surgery (Cardiothoracic Vascular Surgery) | Admitting: Thoracic Surgery (Cardiothoracic Vascular Surgery)

## 2021-02-13 ENCOUNTER — Ambulatory Visit (INDEPENDENT_AMBULATORY_CARE_PROVIDER_SITE_OTHER): Payer: Self-pay | Admitting: Thoracic Surgery (Cardiothoracic Vascular Surgery)

## 2021-02-13 VITALS — BP 138/83 | HR 95 | Resp 20 | Ht 70.0 in | Wt 182.0 lb

## 2021-02-13 DIAGNOSIS — Z902 Acquired absence of lung [part of]: Secondary | ICD-10-CM

## 2021-02-13 DIAGNOSIS — Z09 Encounter for follow-up examination after completed treatment for conditions other than malignant neoplasm: Secondary | ICD-10-CM

## 2021-02-13 DIAGNOSIS — C3411 Malignant neoplasm of upper lobe, right bronchus or lung: Secondary | ICD-10-CM

## 2021-02-13 IMAGING — DX DG CHEST 2V
2 series · 2 of 2 positions shown · non-contrast
Comparison: [DATE]

CLINICAL DATA: Status post thoracotomy and VATS procedure

EXAM:
CHEST - 2 VIEW

[dg chest 2 view (1 of 2)]
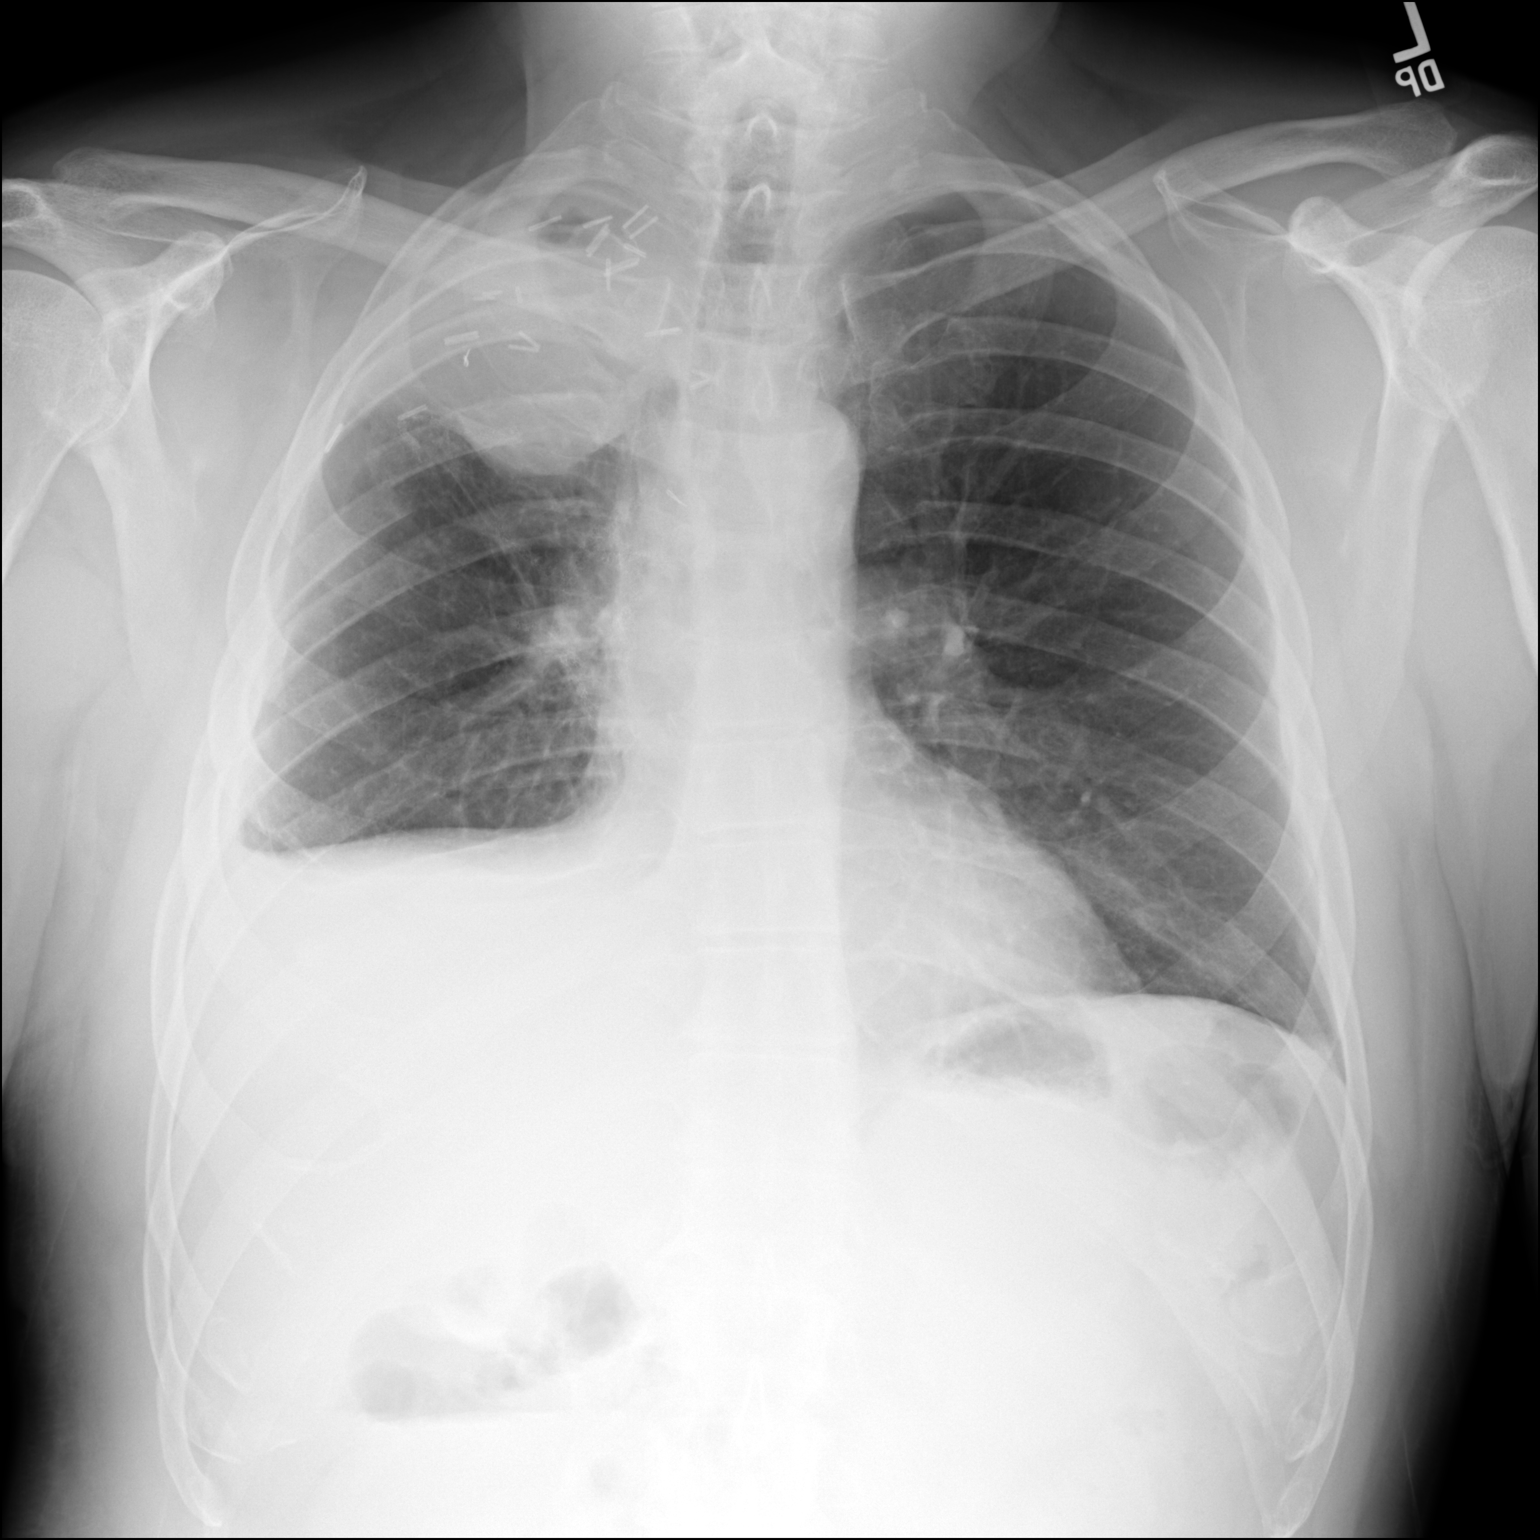

[dg chest 2 view (2 of 2)]
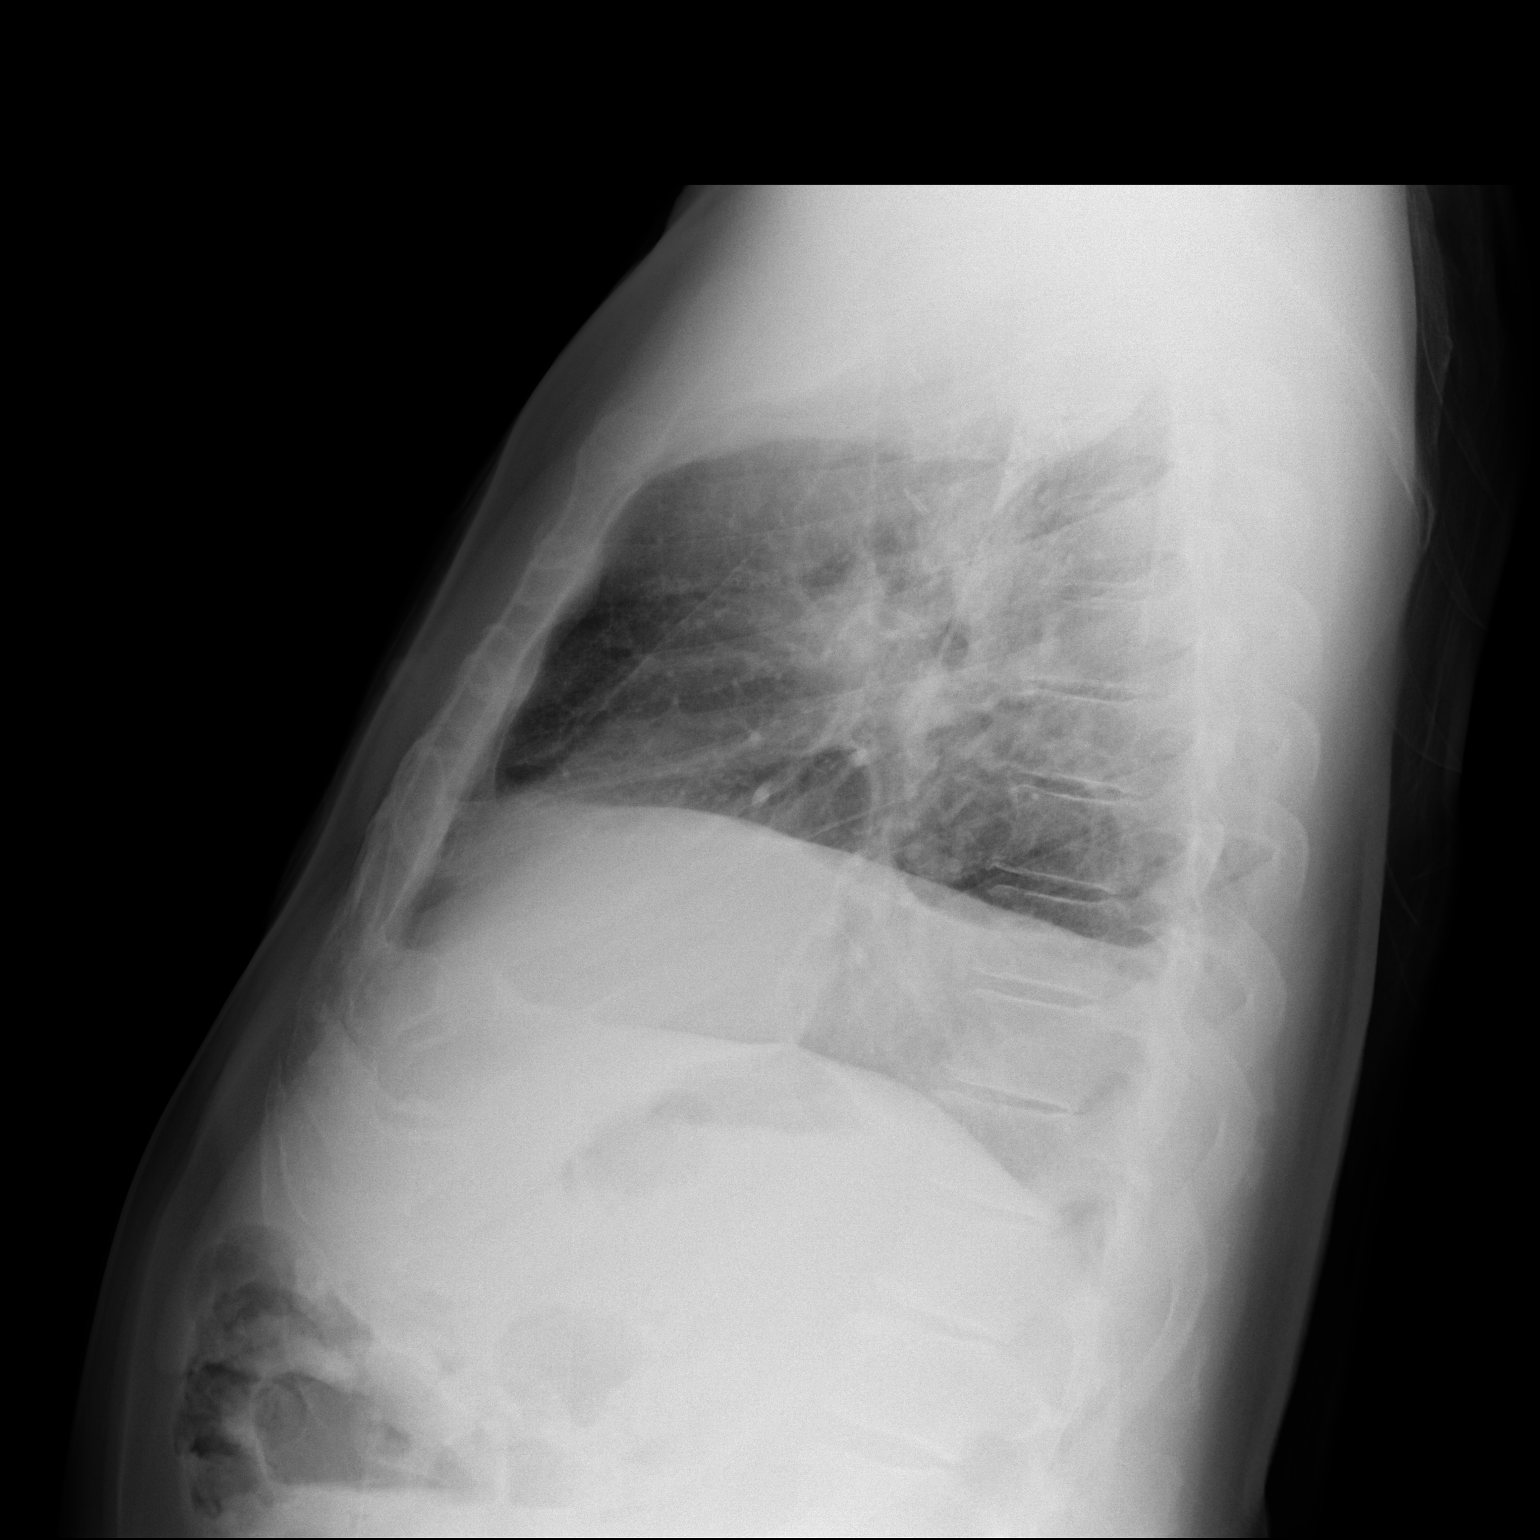

[2 of 2 positions shown; findings below may reference images not displayed]

FINDINGS: There is a masslike area in the right upper lobe extending to the
apex with surgical clips in this area. This masslike area measures
8.0 x 8.1 cm. There is a right pleural effusion with volume loss on
the right. Left lung is clear. Heart size and pulmonary vascularity
normal. No adenopathy no bone lesions.
IMPRESSION: 8.1 x 8.0 cm masslike area right upper lobe toward the apex. Suspect
postoperative loculated fluid hematoma in this area is most likely
etiology given the dramatic change compared to the study from
approximately 2 weeks prior. There is a small right pleural effusion
there is right base volume loss. Left lung is clear. Heart size
normal.

## 2021-02-13 MED ORDER — GABAPENTIN 300 MG PO CAPS: 300.0000 mg | ORAL_CAPSULE | Freq: Three times a day (TID) | ORAL | 1 refills | Status: DC

## 2021-02-13 NOTE — Progress Notes (Signed)
Christopher Carter       [place] [address]             [phone]     HPI: [name] returns for a scheduled postoperative follow-up visit  Christopher Carter is a 60 year old man with a history of hypertension and depression.  He is a non-smoker.  He presented with a persistent cough.  He had a chest x-ray which showed a right upper lobe mass.  On CT that was 7.5 x 6.4 x 6.4 cm.  On PET CT the mass was hypermetabolic.  I did a right thoracotomy and right upper lobectomy and node dissection on 01/22/2021.  The mass was invading the chest wall and there was a positive margin posteriorly near the spine.  He went home on day 4.  He complains of pain near the back of his incision that radiates up towards his shoulder.  He also has some pain lower in the chest as well under the right pectoral muscle.  No shortness of breath.  Past Medical History:  Diagnosis Date  . Anxiety   . Cancer ([place]) 12/2020  . Depression   . Hypertension   . Pneumonia    1990's    Current Outpatient Medications  Medication Sig Dispense Refill  . gabapentin (NEURONTIN) 300 MG capsule Take 1 capsule (300 mg total) by mouth 3 (three) times daily. 90 capsule 1  . acetaminophen (TYLENOL) 500 MG tablet Take 500 mg by mouth 2 (two) times daily as needed for moderate pain or headache.    Marland Kitchen amLODipine (NORVASC) 10 MG tablet Take 1 tablet (10 mg total) by mouth daily. 30 tablet 1  . Ascorbic Acid (VITAMIN C) 1000 MG tablet Take 1,000 mg by mouth daily.    Marland Kitchen aspirin EC 81 MG tablet Take 81 mg by mouth daily. Swallow whole.    . Coenzyme Q10 (COQ10) 100 MG CAPS Take 100 mg by mouth daily.    Marland Kitchen HAWTHORNE BERRY PO Take 1 tablet by mouth daily.    . irbesartan (AVAPRO) 300 MG tablet Take 300 mg by mouth daily.    Marland Kitchen latanoprost (XALATAN) 0.005 % ophthalmic solution Place 1 drop into both eyes at bedtime.    . Menthol, Topical Analgesic, (BIOFREEZE EX) Apply 1 application topically daily as needed (pain).     Marland Kitchen OVER THE COUNTER MEDICATION Take 1 tablet by mouth 3 (three) times daily. Jointprin otc supplement    . QUEtiapine (SEROQUEL) 50 MG tablet Take 50 mg by mouth at bedtime.    . Red Yeast Rice Extract (RED YEAST RICE PO) Take 2 tablets by mouth daily.    . sertraline (ZOLOFT) 100 MG tablet Take 100 mg by mouth daily.    . traMADol (ULTRAM) 50 MG tablet Take 1 tablet (50 mg total) by mouth every 6 (six) hours as needed (mild pain). 28 tablet 0  . TURMERIC PO Take 1 tablet by mouth daily.    . vitamin B-12 (CYANOCOBALAMIN) 1000 MCG tablet Take 1,000 mcg by mouth daily.     No current facility-administered medications for this visit.    Physical Exam BP 138/83   Pulse 95   Resp 20   Ht 5\' 10"  (1.778 m)   Wt 182 lb (82.6 kg)   SpO2 95% Comment: RA  BMI 26.3 kg/m  60 year old man in no acute distress Alert and oriented x3 with no focal deficits Lungs diminished at right base but otherwise clear and equal Cardiac  regular rate and rhythm No peripheral edema  Diagnostic Tests: CHEST - 2 VIEW  COMPARISON:  Jan 26, 2021  FINDINGS: There is a masslike area in the right upper lobe extending to the apex with surgical clips in this area. This masslike area measures 8.0 x 8.1 cm. There is a right pleural effusion with volume loss on the right. Left lung is clear. Heart size and pulmonary vascularity normal. No adenopathy no bone lesions.  IMPRESSION: 8.1 x 8.0 cm masslike area right upper lobe toward the apex. Suspect postoperative loculated fluid hematoma in this area is most likely etiology given the dramatic change compared to the study from approximately 2 weeks prior. There is a small right pleural effusion there is right base volume loss. Left lung is clear. Heart size normal.   Electronically Signed   By: Lowella Grip III M.D.   On: 02/13/2021 14:31 I personally reviewed the chest x-ray images and concur with the finding of a loculated effusion/hematoma in the  right apex.  Impression: Christopher Carter is a 60 year old man with history of hypertension and depression.  He is a non-smoker.  He presented with a persistent cough.  He was found to have a large right upper lobe mass.  He underwent a right thoracotomy and right upper lobectomy on 01/22/2021.  Final pathology showed a T4, N0, stage IIIa adenocarcinoma.  On chest x-ray today there is a loculated effusion/hematoma at the right apex.  That was not present prior to leaving the hospital.  I think we need to get that drained.  I will talk to IR to see if they could do an image guided thoracentesis or whether we would need to have a percutaneous drain placed.  From a pain standpoint he is only been taking 50 mg of tramadol a couple of times a day.  That is a very low dose and has not been particularly effective.  He may respond better to gabapentin so I will give him a prescription for that.  He also can use up to 2 tramadol tablets up to 4 times a day if needed.  He could also try nonsteroidal anti-inflammatories to see if they help.   Plan: I will check with IR regarding possible drainage of apical fluid collection Gabapentin 300 mg p.o. nightly for 2 days then twice daily for 2 days and then 3 times a day Continue tramadol as needed, may take up to 100 mg 4 times daily as needed if needed Return in 1 week with PA lateral chest x-ray  Christopher Nakayama, MD [facility] [phone]

## 2021-02-14 ENCOUNTER — Encounter: Payer: Self-pay | Admitting: *Deleted

## 2021-02-14 NOTE — Progress Notes (Signed)
I followed up on [facility]. [facility] results.  The portal shows completion date on 6/13. I updated Dr. Julien Nordmann.

## 2021-02-15 ENCOUNTER — Telehealth: Payer: Self-pay

## 2021-02-15 ENCOUNTER — Other Ambulatory Visit: Payer: Self-pay

## 2021-02-15 ENCOUNTER — Other Ambulatory Visit: Payer: Self-pay | Admitting: Thoracic Surgery (Cardiothoracic Vascular Surgery)

## 2021-02-15 DIAGNOSIS — J90 Pleural effusion, not elsewhere classified: Secondary | ICD-10-CM

## 2021-02-15 MED ORDER — TRAMADOL HCL 50 MG PO TABS: 50.0000 mg | ORAL_TABLET | Freq: Four times a day (QID) | ORAL | 0 refills | Status: DC | PRN

## 2021-02-15 NOTE — Telephone Encounter (Signed)
Per Dr, Blair Dolphin in refill prescription for patient to patient's preferred pharmacy [facility] on [address] in [place].  traMADol (ULTRAM) 50 MG tablet 50 mg, Every 6 hrs PRN  Prescription above called in. Patient called and made aware.

## 2021-02-15 NOTE — Telephone Encounter (Signed)
-----   Message from [name], MD sent at 02/15/2021  2:39 PM EDT ----- Regarding: RE: Hospital drainage from IR Let him know I talked to IR. They want a noncontrast CT chest, which we need to order Delsa Sale can you do that?). Once we get that we'll see if they can do a thoracentesis or need to put in a drain.  [facility] ----- Message ----- From: Donnella Sham, RN Sent: 02/15/2021  11:12 AM EDT To: [name], MD Subject: Hospital drainage from [place],  Patient is calling asking about an update in regards to IR and being drained? Please advise.  Thanks,  Caryl Pina

## 2021-02-15 NOTE — Telephone Encounter (Signed)
-----   Message from [name], MD sent at 02/15/2021  4:30 PM EDT ----- Regarding: RE: Hospital drainage from IR yes ----- Message ----- From: Donnella Sham, RN Sent: 02/15/2021   4:28 PM EDT To: [name], MD Subject: RE: Hospital drainage from IR                  We are in the process of letting him know. He called back again asking if he could have a refill of Tramadol. States that he is completely out.  Just let me know and I can send in the refill if approved.  Thanks, Caryl Pina  ----- Message ----- From: [name], MD Sent: 02/15/2021   2:41 PM EDT To: Garvin Fila, RN Subject: RE: Hospital drainage from IR                  Let him know I talked to IR. They want a noncontrast CT chest, which we need to order Delsa Sale can you do that?). Once we get that we'll see if they can do a thoracentesis or need to put in a drain.  [facility] ----- Message ----- From: Donnella Sham, RN Sent: 02/15/2021  11:12 AM EDT To: [name], MD Subject: Hospital drainage from [place],  Patient is calling asking about an update in regards to IR and being drained? Please advise.  Thanks,  Caryl Pina

## 2021-02-16 ENCOUNTER — Ambulatory Visit
Admission: RE | Admit: 2021-02-16 | Discharge: 2021-02-16 | Disposition: A | Discharge status: Home / Self Care | Payer: 59 | Source: Ambulatory Visit | Attending: Thoracic Surgery (Cardiothoracic Vascular Surgery) | Admitting: Thoracic Surgery (Cardiothoracic Vascular Surgery)

## 2021-02-16 ENCOUNTER — Other Ambulatory Visit: Payer: Self-pay

## 2021-02-16 ENCOUNTER — Encounter (HOSPITAL_COMMUNITY): Payer: Self-pay

## 2021-02-16 DIAGNOSIS — J90 Pleural effusion, not elsewhere classified: Secondary | ICD-10-CM

## 2021-02-16 IMAGING — CT CT CHEST W/O CM
1 series · 15 of 34 positions shown, 19 images · non-contrast
Comparison: PET-CT [DATE] and chest x-ray [DATE]

CLINICAL DATA: History of right upper lobe lung cancer status post
right upper lobe lobectomy. Follow-up postoperative right apical
fluid collection.

EXAM:
CT CHEST WITHOUT CONTRAST
TECHNIQUE: Multidetector CT imaging of the chest was performed following the
standard protocol without IV contrast.

[Series 2: chest w/(date) · axial · 0.87mm/px · z∈[-325,-37]mm · 15 of 170 slices shown, 19 images]
[im 13/170  mediastinal]
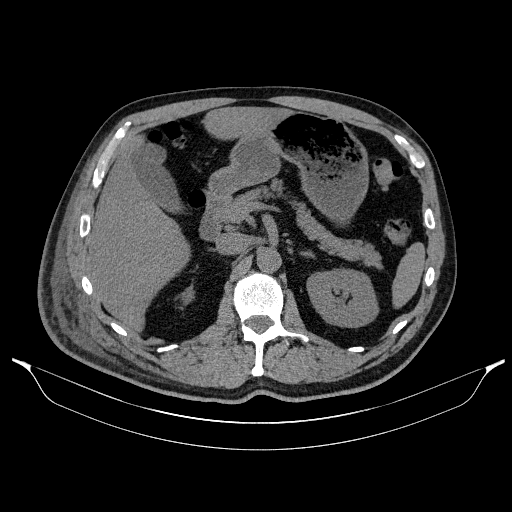
[im 13/170  lung]
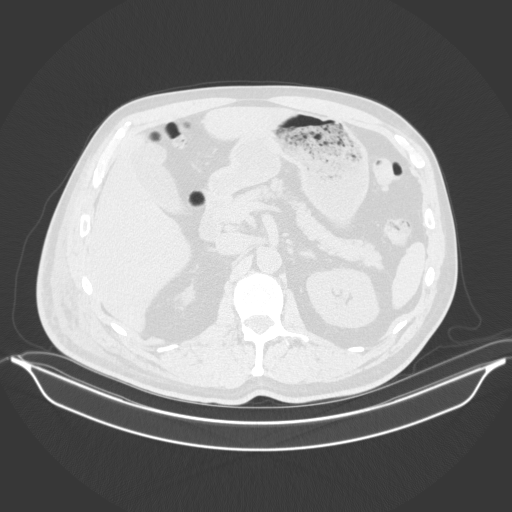
[im 26/170  lung]
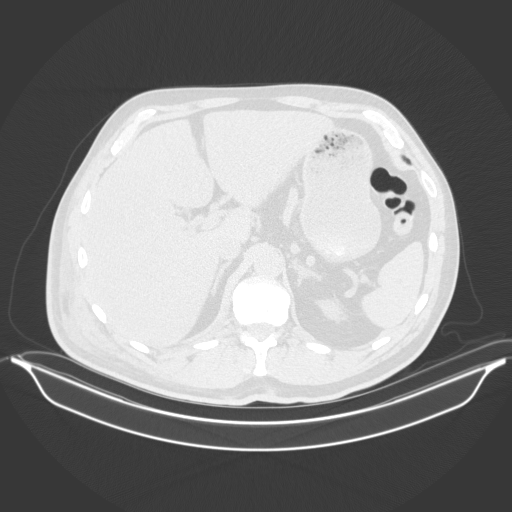
[im 34/170  lung]
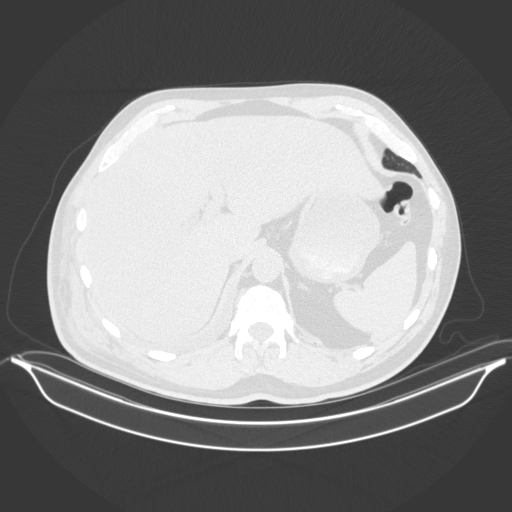
[im 44/170  lung]
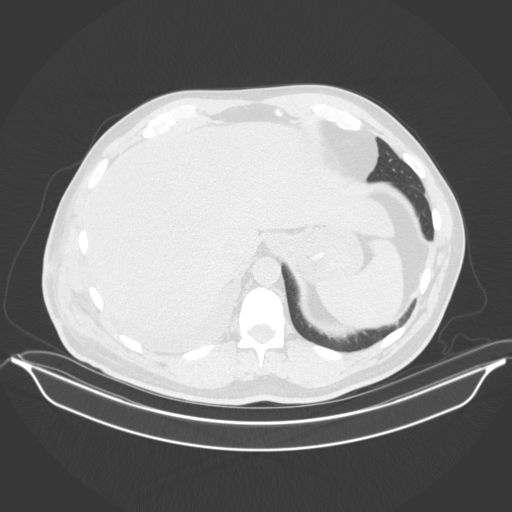
[im 57/170  mediastinal]
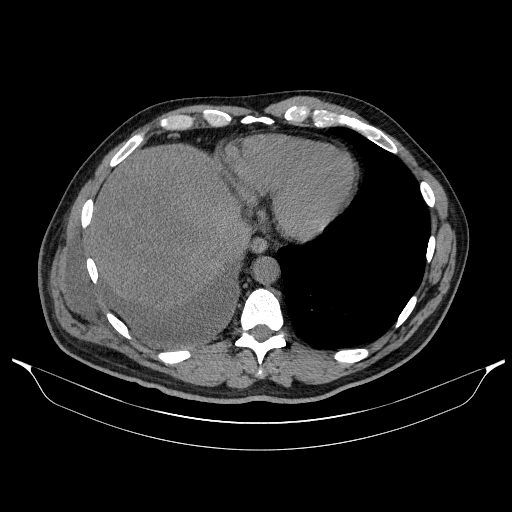
[im 57/170  lung]
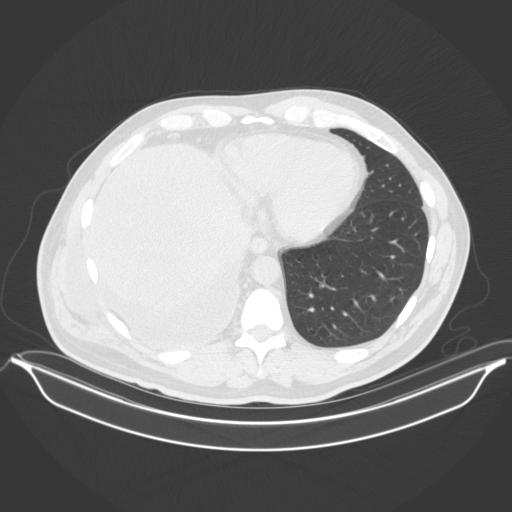
[im 68/170  lung]
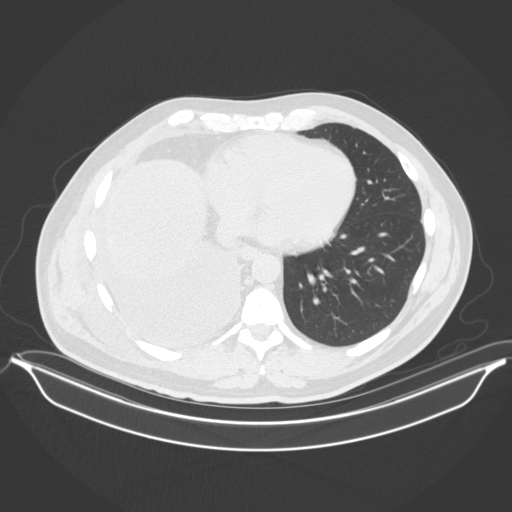
[im 76/170  lung]
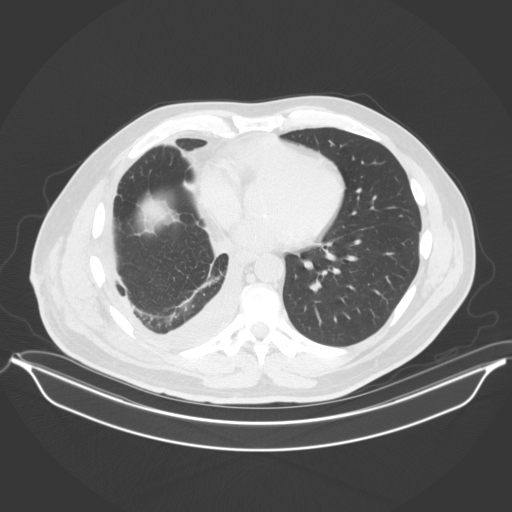
[im 88/170  lung]
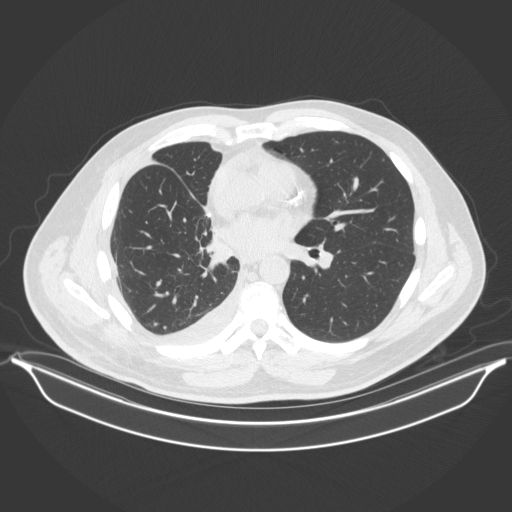
[im 94/170  mediastinal]
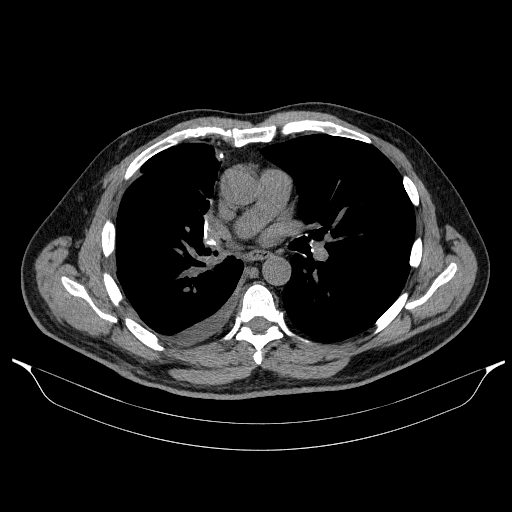
[im 94/170  lung]
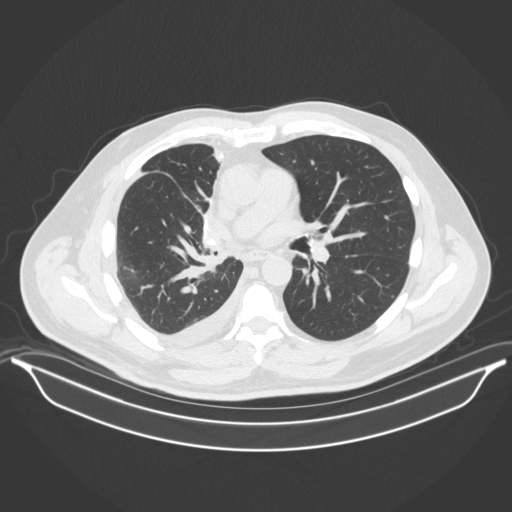
[im 102/170  lung]
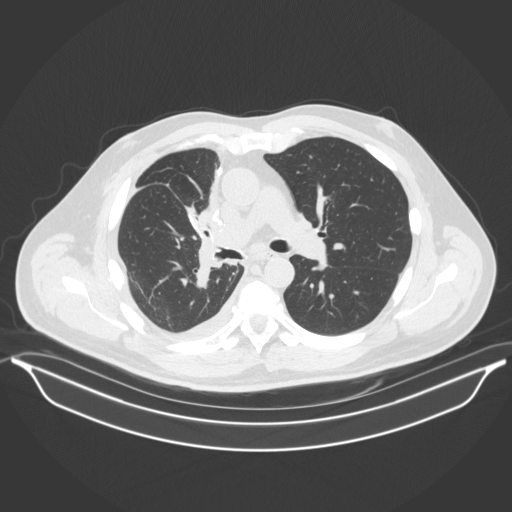
[im 113/170  lung]
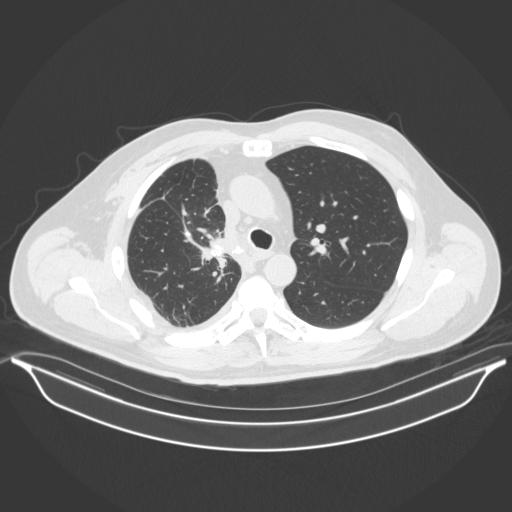
[im 126/170  lung]
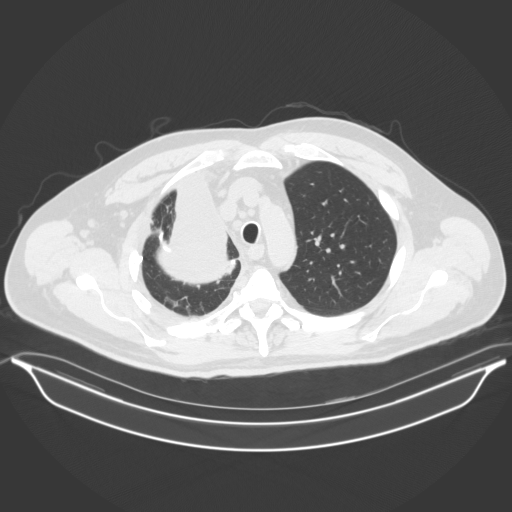
[im 136/170  mediastinal]
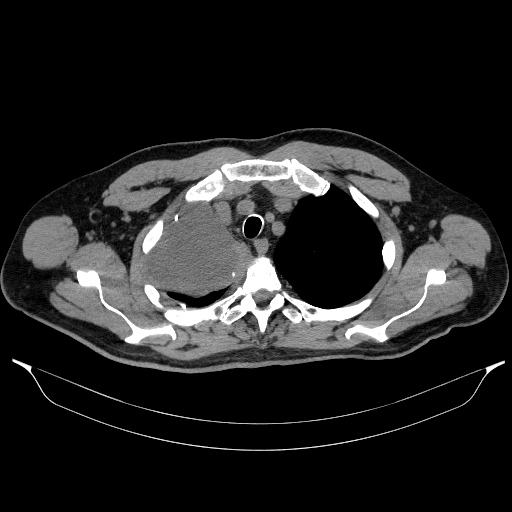
[im 136/170  lung]
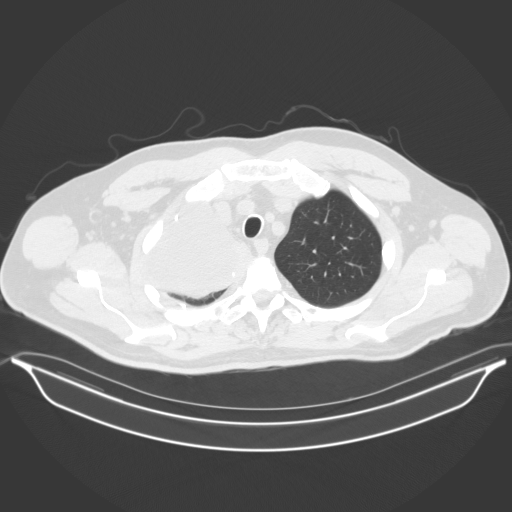
[im 144/170  lung]
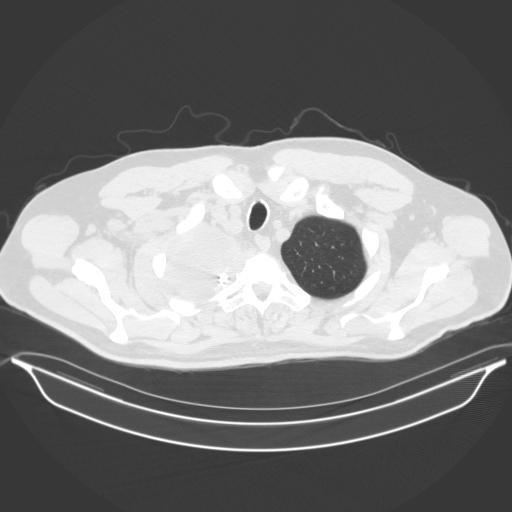
[im 157/170  lung]
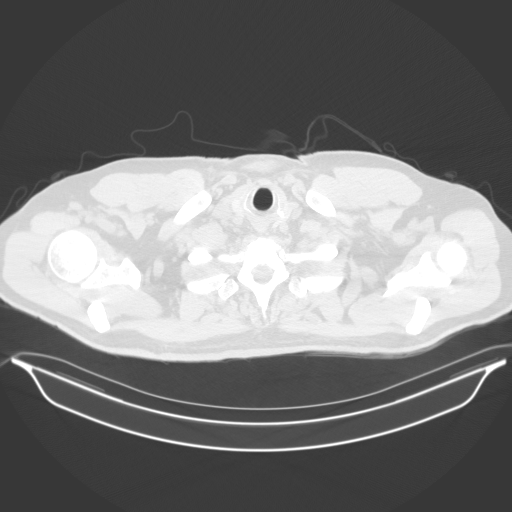

[15 of 34 positions shown; findings below may reference images not displayed]

FINDINGS: Cardiovascular: The heart is normal in size peer no pericardial
effusion. Stable age advanced coronary artery calcifications. No
definite aortic calcifications.

Mediastinum/Nodes: Stable scattered sub 8 mm mediastinal lymph
nodes. No new or progressive findings. The esophagus is grossly
normal.

Lungs/Pleura: Surgical changes from a right upper lobe lobectomy.
There is a moderate-sized right pleural effusion with loculated
fluid at the right lung apex. Small amount of residual pleural air
is also noted.

No worrisome pulmonary nodules to suggest pulmonary metastatic
disease.

Upper Abdomen: No significant upper abdominal findings. No hepatic
or adrenal gland lesions are identified.

Musculoskeletal: No significant bony findings.
IMPRESSION: 1. Surgical changes from a right upper lobe lobectomy.
2. Moderate-sized right pleural effusion with loculated fluid at the
right lung apex.
3. No worrisome pulmonary nodules to suggest pulmonary metastatic
disease.
4. Stable age advanced coronary artery calcifications.

Aortic Atherosclerosis ([3C]-[3C])

## 2021-02-17 ENCOUNTER — Other Ambulatory Visit: Payer: Self-pay | Admitting: Surgical

## 2021-02-19 ENCOUNTER — Other Ambulatory Visit: Payer: Self-pay | Admitting: Thoracic Surgery (Cardiothoracic Vascular Surgery)

## 2021-02-19 DIAGNOSIS — R918 Other nonspecific abnormal finding of lung field: Secondary | ICD-10-CM

## 2021-02-20 ENCOUNTER — Other Ambulatory Visit: Payer: Self-pay | Admitting: Thoracic Surgery (Cardiothoracic Vascular Surgery)

## 2021-02-20 ENCOUNTER — Ambulatory Visit
Admission: RE | Admit: 2021-02-20 | Discharge: 2021-02-20 | Disposition: A | Discharge status: Home / Self Care | Payer: 59 | Source: Ambulatory Visit | Attending: Thoracic Surgery (Cardiothoracic Vascular Surgery) | Admitting: Thoracic Surgery (Cardiothoracic Vascular Surgery)

## 2021-02-20 ENCOUNTER — Other Ambulatory Visit: Payer: Self-pay

## 2021-02-20 ENCOUNTER — Ambulatory Visit (INDEPENDENT_AMBULATORY_CARE_PROVIDER_SITE_OTHER): Payer: Self-pay | Admitting: Thoracic Surgery (Cardiothoracic Vascular Surgery)

## 2021-02-20 VITALS — BP 125/80 | HR 100 | Resp 20 | Ht 70.0 in | Wt 185.0 lb

## 2021-02-20 DIAGNOSIS — C3411 Malignant neoplasm of upper lobe, right bronchus or lung: Secondary | ICD-10-CM

## 2021-02-20 DIAGNOSIS — Z902 Acquired absence of lung [part of]: Secondary | ICD-10-CM

## 2021-02-20 DIAGNOSIS — Z09 Encounter for follow-up examination after completed treatment for conditions other than malignant neoplasm: Secondary | ICD-10-CM

## 2021-02-20 DIAGNOSIS — R918 Other nonspecific abnormal finding of lung field: Secondary | ICD-10-CM

## 2021-02-20 IMAGING — DX DG CHEST 2V
2 series · 2 of 2 positions shown · non-contrast
Comparison: [DATE]

CLINICAL DATA: Right upper lobe mass

EXAM:
CHEST - 2 VIEW

[dg chest 2 view (1 of 2)]
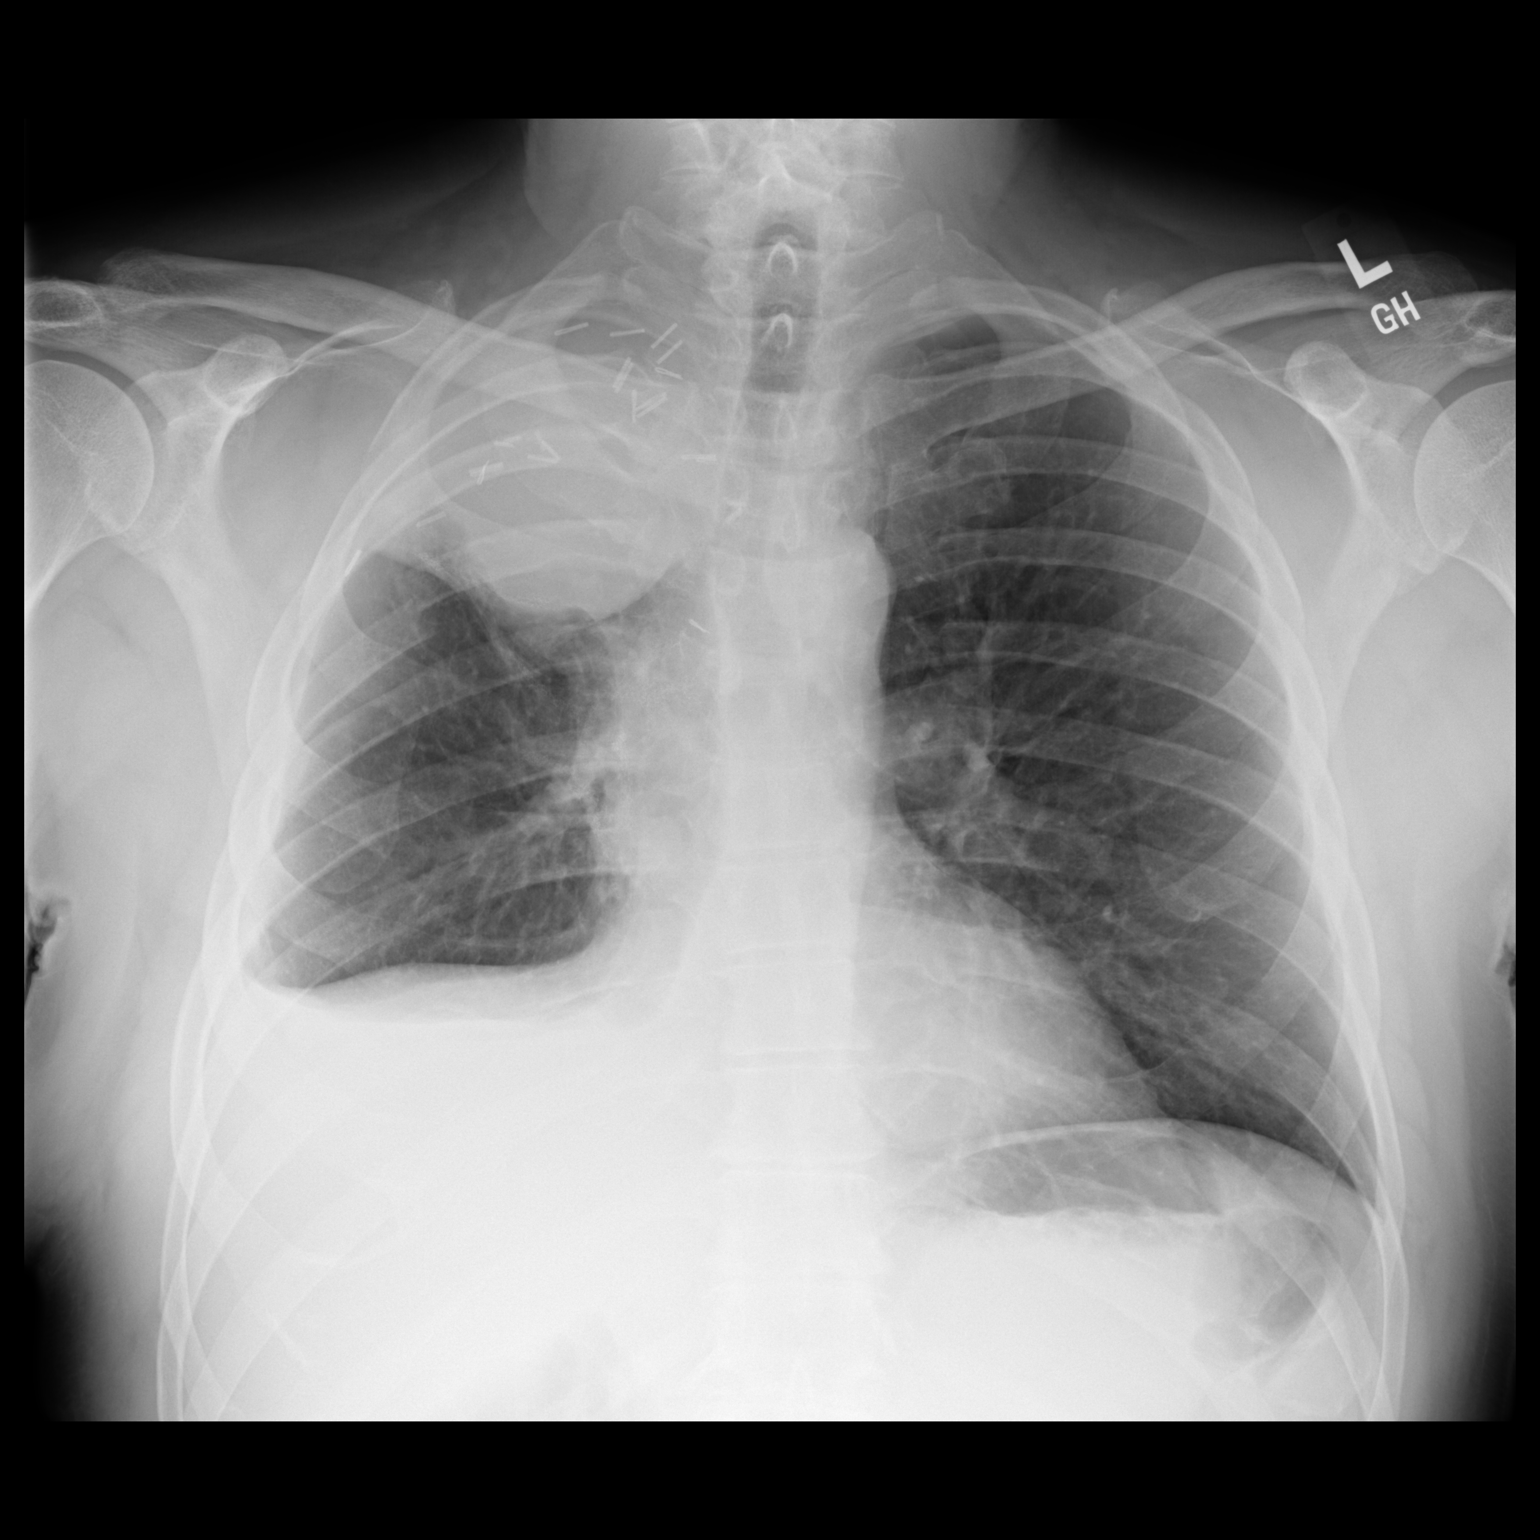

[dg chest 2 view (2 of 2)]
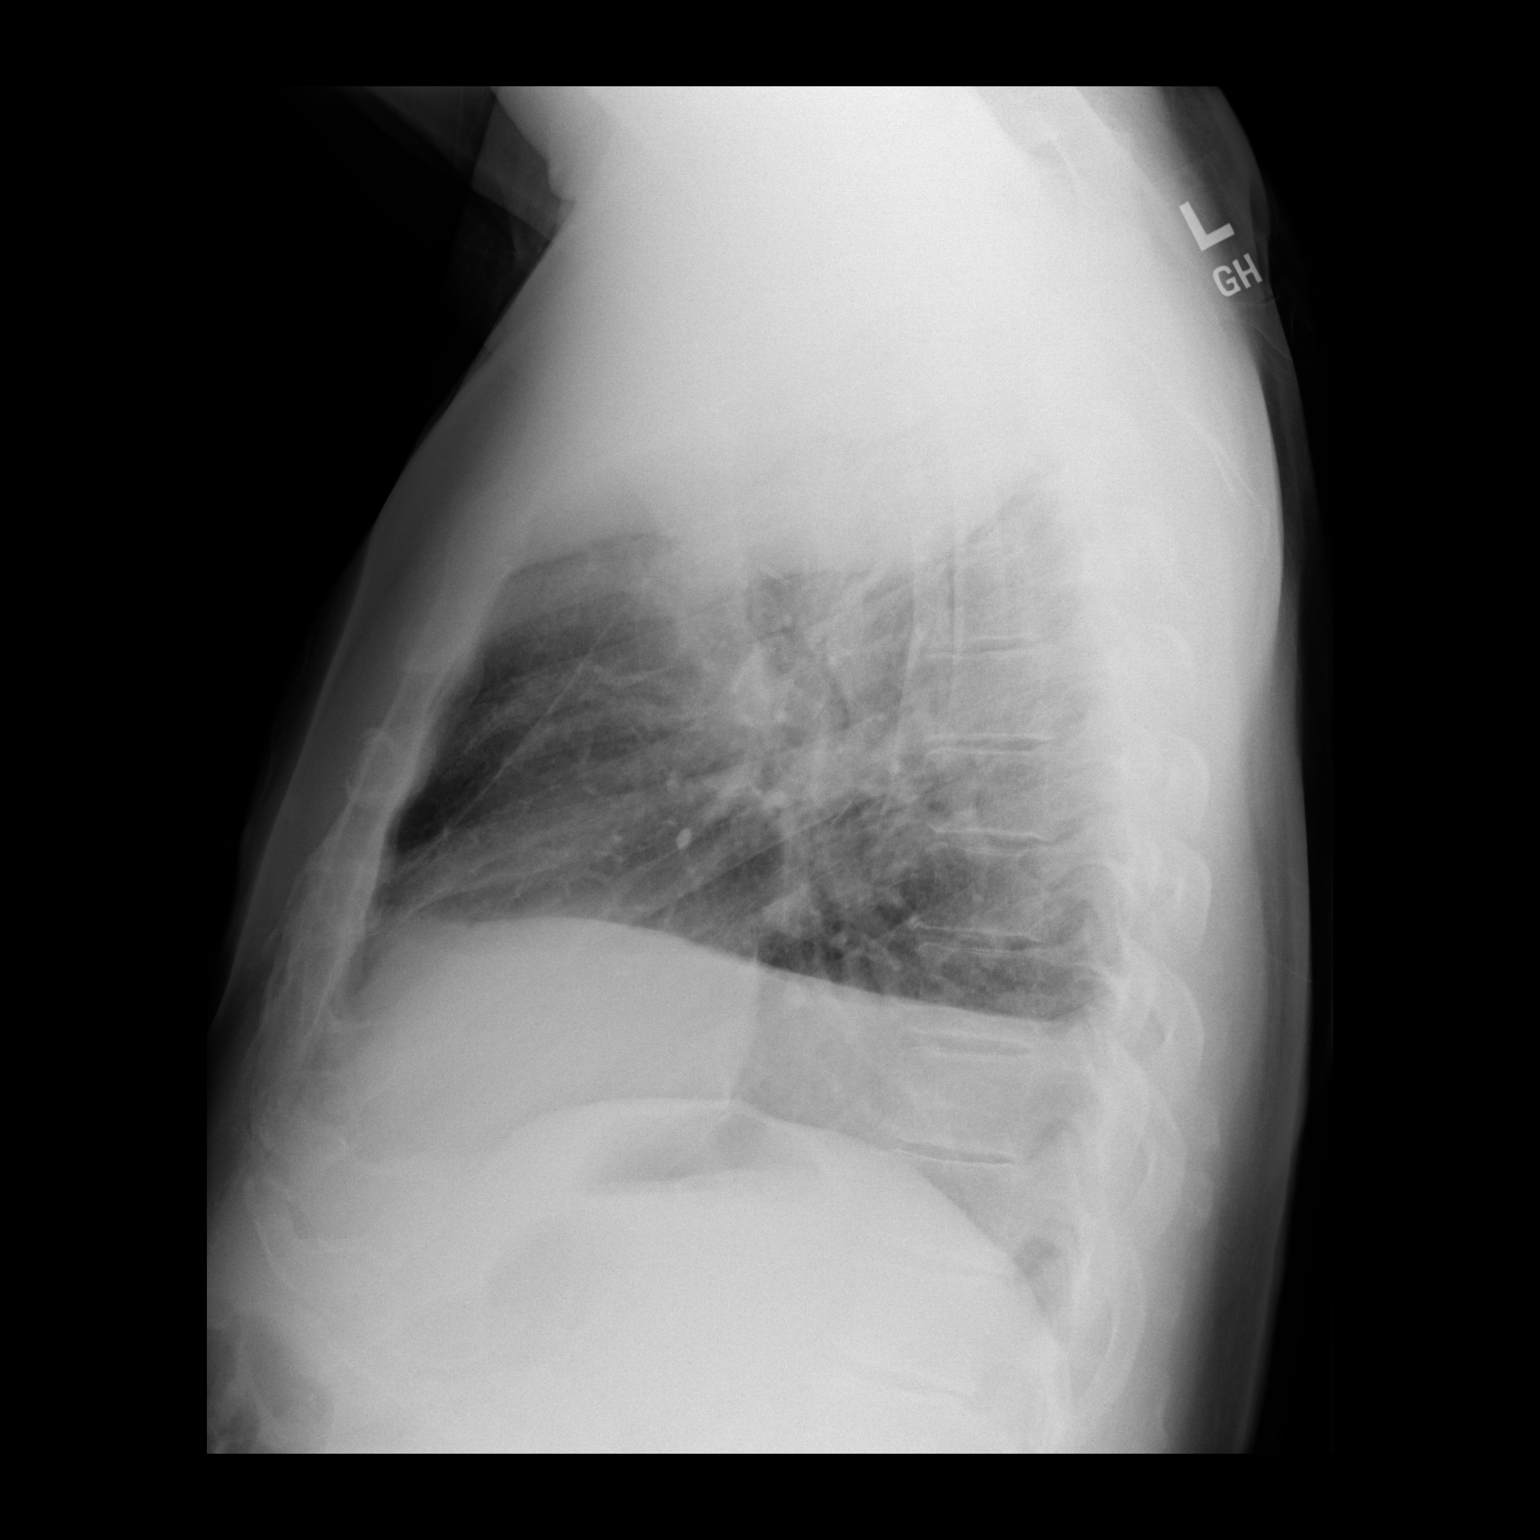

[2 of 2 positions shown; findings below may reference images not displayed]

FINDINGS: Cardiac shadow is stable. Volume loss on the right is noted with
small effusions stable from the prior exam. Postoperative changes
are noted in the right apex with persistent soft tissue mass lesion
stable from the prior exam. Left lung remains clear. Posterior
fracture of the right fifth rib is noted stable from previous exam.
IMPRESSION: Stable right upper lobe mass lesion.  Postsurgical changes are seen.

## 2021-02-20 MED ORDER — TRAMADOL HCL 50 MG PO TABS: 100.0000 mg | ORAL_TABLET | Freq: Four times a day (QID) | ORAL | 0 refills | Status: DC | PRN

## 2021-02-20 NOTE — Progress Notes (Signed)
[address]       [place] [address]             [phone]       HPI: Christopher Carter returns for follow-up following his recent right upper lobectomy.  Christopher Carter is a 60 year old man with a history of depression and hypertension.  He is a non-smoker.  He presented with a cough and was found to have a right upper lobe lung mass.  I did a right thoracotomy for upper lobectomy and node dissection on 01/22/2021.  The mass was invading the chest wall and there was a positive margin posteriorly near the spine.  He had a lot of difficulty with pain.  He has been taking tramadol for pain.  50 mg is not effective so advised him to go to 100 mg.  When he did so he ran out completely for a couple of days.  I am going to change his prescription to try to address that issue.  I also started him on gabapentin which he is taking 3 times a day.  That has helped some with the neuropathic component.  He is not having any shortness of breath.   Past Medical History:  Diagnosis Date   Anxiety    Cancer ([place]) 12/2020   Depression    Hypertension    Pneumonia    1990's     Current Outpatient Medications  Medication Sig Dispense Refill   acetaminophen (TYLENOL) 500 MG tablet Take 500 mg by mouth 2 (two) times daily as needed for moderate pain or headache.     amLODipine (NORVASC) 10 MG tablet TAKE 1 TABLET BY MOUTH EVERY DAY 30 tablet 1   Ascorbic Acid (VITAMIN C) 1000 MG tablet Take 1,000 mg by mouth daily.     aspirin EC 81 MG tablet Take 81 mg by mouth daily. Swallow whole.     Coenzyme Q10 (COQ10) 100 MG CAPS Take 100 mg by mouth daily.     gabapentin (NEURONTIN) 300 MG capsule Take 1 capsule (300 mg total) by mouth 3 (three) times daily. 90 capsule 1   HAWTHORNE BERRY PO Take 1 tablet by mouth daily.     irbesartan (AVAPRO) 300 MG tablet Take 300 mg by mouth daily.     latanoprost (XALATAN) 0.005 % ophthalmic solution Place 1 drop into both eyes at bedtime.      Menthol, Topical Analgesic, (BIOFREEZE EX) Apply 1 application topically daily as needed (pain).     OVER THE COUNTER MEDICATION Take 1 tablet by mouth 3 (three) times daily. Jointprin otc supplement     QUEtiapine (SEROQUEL) 50 MG tablet Take 50 mg by mouth at bedtime.     Red Yeast Rice Extract (RED YEAST RICE PO) Take 2 tablets by mouth daily.     sertraline (ZOLOFT) 100 MG tablet Take 100 mg by mouth daily.     [START ON 02/24/2021] traMADol (ULTRAM) 50 MG tablet Take 2 tablets (100 mg total) by mouth every 6 (six) hours as needed for up to 7 days (mild pain). 56 tablet 0   TURMERIC PO Take 1 tablet by mouth daily.     vitamin B-12 (CYANOCOBALAMIN) 1000 MCG tablet Take 1,000 mcg by mouth daily.     No current facility-administered medications for this visit.    Physical Exam BP 125/80   Pulse 100   Resp 20   Ht 5\' 10"  (1.778 m)   Wt 185 lb (83.9 kg)  SpO2 97%   BMI 26.57 kg/m  60 year old man in no acute distress Alert and oriented x3 Lungs diminished to right base but otherwise clear Some fluid under the incision consistent with a seroma  Diagnostic Tests: I personally reviewed the chest x-ray.  Shows loculated fluid.  I also reviewed his CT which shows the fluid.  I discussed the CT results with Dr. Anselm Pancoast of interventional radiology.  Impression: Christopher Carter is a 60 year old man with history of hypertension and depression but no smoking history.  He presented with a cough and was found to have a right upper lobe mass.  He had a right thoracotomy for resection of a T4, N0, stage IIIa adenocarcinoma.  There was some residual tumor posteriorly near the spine that we were not able to completely resect.  He will need postoperative adjuvant radiation and chemotherapy.  Continues to have incisional pain.  It has improved to some extent with the gabapentin but he still requiring tramadol.  Taking 1 tramadol at a time really does not help but he does get relief when he takes 2.   He got his last prescription on Saturday so Georgina Peer write him a new prescription beginning this Saturday for tramadol 50 mg tablets 100 mg p.o. every 6 hours as needed dispense 56 tablets with no refills.  Hopefully by the time he completes that prescription he will be able to wean himself off of that.  I discussed the CT results with Dr. Anselm Pancoast.  I explained to attempt a CT-guided aspiration of the fluid.  There is a pretty high probability that the fluid will recur but I think it is worth trying to get it out if we can.  He sees Dr. Lisbeth Renshaw next week to discuss radiation and also has an appointment set up with Dr. Julien Nordmann in the near future for adjuvant chemotherapy.  Plan: CT-guided aspiration of fluid Return in 2 weeks with PA lateral chest x-ray to check on progress  Christopher Nakayama, MD [facility] [phone]

## 2021-02-23 ENCOUNTER — Telehealth: Payer: Self-pay | Admitting: Internal Medicine

## 2021-02-23 ENCOUNTER — Encounter (HOSPITAL_COMMUNITY): Payer: Self-pay

## 2021-02-23 NOTE — Progress Notes (Unsigned)
       Patient Demographics  Patient Name  Christopher Carter, Christopher Carter Legal Sex  Male DOB  Jan 19, 1961 SSN  [id number] Address  [place] [address] Phone  [phone] [facility])  [phone] (Mobile)     RE: Biopsy Received: Today Markus Daft, MD  Ernestene Mention for CT guided thoracentesis of right apical effusion. Discussed with Dr. Roxan Hockey.   [place]         Previous Messages    ----- Message -----  From: Lenore Cordia  Sent: 02/23/2021  10:34 AM EDT  To: Ir Procedure Requests  Subject: Biopsy                                         Procedure Requested:  CT Guided Aspiration    Reason for Procedure:  loculated pleural effusion,   Provider Requesting:  [name]  Provider Telephone:  [phone]     Providers Comments: Attn: Dr Kathlene Cote or Dr Anselm Pancoast

## 2021-02-23 NOTE — Telephone Encounter (Signed)
Per 6/9 sch msg, left message 

## 2021-02-25 ENCOUNTER — Other Ambulatory Visit: Payer: Self-pay

## 2021-02-25 ENCOUNTER — Inpatient Hospital Stay (HOSPITAL_COMMUNITY)
Admission: EM | Admit: 2021-02-25 | Discharge: 2021-03-07 | DRG: 871 | Disposition: A | Discharge status: Home / Self Care | Payer: 59 | Attending: Internal Medicine | Admitting: Internal Medicine

## 2021-02-25 ENCOUNTER — Emergency Department (HOSPITAL_COMMUNITY): Payer: 59

## 2021-02-25 ENCOUNTER — Encounter (HOSPITAL_COMMUNITY): Payer: Self-pay

## 2021-02-25 DIAGNOSIS — Z79899 Other long term (current) drug therapy: Secondary | ICD-10-CM

## 2021-02-25 DIAGNOSIS — R6520 Severe sepsis without septic shock: Secondary | ICD-10-CM | POA: Diagnosis present

## 2021-02-25 DIAGNOSIS — G8929 Other chronic pain: Secondary | ICD-10-CM | POA: Diagnosis present

## 2021-02-25 DIAGNOSIS — Z885 Allergy status to narcotic agent status: Secondary | ICD-10-CM

## 2021-02-25 DIAGNOSIS — R739 Hyperglycemia, unspecified: Secondary | ICD-10-CM | POA: Diagnosis not present

## 2021-02-25 DIAGNOSIS — J9382 Other air leak: Secondary | ICD-10-CM | POA: Diagnosis not present

## 2021-02-25 DIAGNOSIS — Z9689 Presence of other specified functional implants: Secondary | ICD-10-CM

## 2021-02-25 DIAGNOSIS — K920 Hematemesis: Secondary | ICD-10-CM | POA: Diagnosis present

## 2021-02-25 DIAGNOSIS — R042 Hemoptysis: Secondary | ICD-10-CM | POA: Diagnosis present

## 2021-02-25 DIAGNOSIS — F419 Anxiety disorder, unspecified: Secondary | ICD-10-CM | POA: Diagnosis present

## 2021-02-25 DIAGNOSIS — R0489 Hemorrhage from other sites in respiratory passages: Secondary | ICD-10-CM | POA: Diagnosis not present

## 2021-02-25 DIAGNOSIS — T380X5A Adverse effect of glucocorticoids and synthetic analogues, initial encounter: Secondary | ICD-10-CM | POA: Diagnosis not present

## 2021-02-25 DIAGNOSIS — A419 Sepsis, unspecified organism: Principal | ICD-10-CM | POA: Diagnosis present

## 2021-02-25 DIAGNOSIS — R609 Edema, unspecified: Secondary | ICD-10-CM

## 2021-02-25 DIAGNOSIS — E876 Hypokalemia: Secondary | ICD-10-CM | POA: Diagnosis not present

## 2021-02-25 DIAGNOSIS — C3411 Malignant neoplasm of upper lobe, right bronchus or lung: Secondary | ICD-10-CM | POA: Diagnosis present

## 2021-02-25 DIAGNOSIS — J9601 Acute respiratory failure with hypoxia: Secondary | ICD-10-CM

## 2021-02-25 DIAGNOSIS — Z888 Allergy status to other drugs, medicaments and biological substances status: Secondary | ICD-10-CM

## 2021-02-25 DIAGNOSIS — F329 Major depressive disorder, single episode, unspecified: Secondary | ICD-10-CM | POA: Diagnosis present

## 2021-02-25 DIAGNOSIS — C3491 Malignant neoplasm of unspecified part of right bronchus or lung: Secondary | ICD-10-CM | POA: Diagnosis present

## 2021-02-25 DIAGNOSIS — T486X5A Adverse effect of antiasthmatics, initial encounter: Secondary | ICD-10-CM | POA: Diagnosis present

## 2021-02-25 DIAGNOSIS — Y92239 Unspecified place in hospital as the place of occurrence of the external cause: Secondary | ICD-10-CM | POA: Diagnosis not present

## 2021-02-25 DIAGNOSIS — Z20822 Contact with and (suspected) exposure to covid-19: Secondary | ICD-10-CM | POA: Diagnosis present

## 2021-02-25 DIAGNOSIS — J189 Pneumonia, unspecified organism: Secondary | ICD-10-CM | POA: Diagnosis present

## 2021-02-25 DIAGNOSIS — Y92009 Unspecified place in unspecified non-institutional (private) residence as the place of occurrence of the external cause: Secondary | ICD-10-CM

## 2021-02-25 DIAGNOSIS — E872 Acidosis: Secondary | ICD-10-CM | POA: Diagnosis present

## 2021-02-25 DIAGNOSIS — D72829 Elevated white blood cell count, unspecified: Secondary | ICD-10-CM | POA: Diagnosis not present

## 2021-02-25 DIAGNOSIS — J9621 Acute and chronic respiratory failure with hypoxia: Secondary | ICD-10-CM | POA: Diagnosis present

## 2021-02-25 DIAGNOSIS — Z902 Acquired absence of lung [part of]: Secondary | ICD-10-CM

## 2021-02-25 DIAGNOSIS — D649 Anemia, unspecified: Secondary | ICD-10-CM | POA: Diagnosis not present

## 2021-02-25 DIAGNOSIS — Z7982 Long term (current) use of aspirin: Secondary | ICD-10-CM

## 2021-02-25 DIAGNOSIS — J90 Pleural effusion, not elsewhere classified: Secondary | ICD-10-CM | POA: Diagnosis present

## 2021-02-25 DIAGNOSIS — J159 Unspecified bacterial pneumonia: Secondary | ICD-10-CM | POA: Diagnosis present

## 2021-02-25 DIAGNOSIS — G4700 Insomnia, unspecified: Secondary | ICD-10-CM | POA: Diagnosis present

## 2021-02-25 DIAGNOSIS — M549 Dorsalgia, unspecified: Secondary | ICD-10-CM | POA: Diagnosis present

## 2021-02-25 DIAGNOSIS — J942 Hemothorax: Secondary | ICD-10-CM | POA: Diagnosis present

## 2021-02-25 DIAGNOSIS — I10 Essential (primary) hypertension: Secondary | ICD-10-CM | POA: Diagnosis present

## 2021-02-25 DIAGNOSIS — S2241XA Multiple fractures of ribs, right side, initial encounter for closed fracture: Secondary | ICD-10-CM | POA: Diagnosis present

## 2021-02-25 DIAGNOSIS — Y95 Nosocomial condition: Secondary | ICD-10-CM | POA: Diagnosis present

## 2021-02-25 LAB — CBC WITH DIFFERENTIAL/PLATELET
Abs Immature Granulocytes: 0.21 10*3/uL — ABNORMAL HIGH (ref 0.00–0.07)
Basophils Absolute: 0.1 10*3/uL (ref 0.0–0.1)
Basophils Relative: 0 %
Eosinophils Absolute: 0.4 10*3/uL (ref 0.0–0.5)
Eosinophils Relative: 1 %
HCT: 40.2 % (ref 39.0–52.0)
Hemoglobin: 13.7 g/dL (ref 13.0–17.0)
Immature Granulocytes: 1 %
Lymphocytes Relative: 4 %
Lymphs Abs: 1.2 10*3/uL (ref 0.7–4.0)
MCH: 30.7 pg (ref 26.0–34.0)
MCHC: 34.1 g/dL (ref 30.0–36.0)
MCV: 90.1 fL (ref 80.0–100.0)
Monocytes Absolute: 1.5 10*3/uL — ABNORMAL HIGH (ref 0.1–1.0)
Monocytes Relative: 5 %
Neutro Abs: 26 10*3/uL — ABNORMAL HIGH (ref 1.7–7.7)
Neutrophils Relative %: 89 %
Platelets: 473 10*3/uL — ABNORMAL HIGH (ref 150–400)
RBC: 4.46 MIL/uL (ref 4.22–5.81)
RDW: 14.2 % (ref 11.5–15.5)
WBC: 29.4 10*3/uL — ABNORMAL HIGH (ref 4.0–10.5)
nRBC: 0 % (ref 0.0–0.2)

## 2021-02-25 LAB — COMPREHENSIVE METABOLIC PANEL
ALT: 28 U/L (ref 0–44)
AST: 21 U/L (ref 15–41)
Albumin: 3.6 g/dL (ref 3.5–5.0)
Alkaline Phosphatase: 103 U/L (ref 38–126)
Anion gap: 13 (ref 5–15)
BUN: 11 mg/dL (ref 6–20)
CO2: 26 mmol/L (ref 22–32)
Calcium: 9.3 mg/dL (ref 8.9–10.3)
Chloride: 99 mmol/L (ref 98–111)
Creatinine, Ser: 0.71 mg/dL (ref 0.61–1.24)
GFR, Estimated: >60 mL/min (ref >60)
Glucose, Bld: 149 mg/dL — ABNORMAL HIGH (ref 70–99)
Potassium: 3.8 mmol/L (ref 3.5–5.1)
Sodium: 138 mmol/L (ref 135–145)
Total Bilirubin: 1.1 mg/dL (ref 0.3–1.2)
Total Protein: 7.1 g/dL (ref 6.5–8.1)

## 2021-02-25 LAB — LIPASE, BLOOD: Lipase: 18 U/L (ref 11–51)

## 2021-02-25 LAB — PROTIME-INR
INR: 1.1 (ref 0.8–1.2)
Prothrombin Time: 13.8 seconds (ref 11.4–15.2)

## 2021-02-25 IMAGING — CR DG CHEST 2V
2 series · 2 of 2 positions shown · non-contrast
Comparison: [DATE]

CLINICAL DATA: History of right partial lobectomy with hemoptysis
and increasing shortness of breath

EXAM:
CHEST - 2 VIEW

[chest pa]
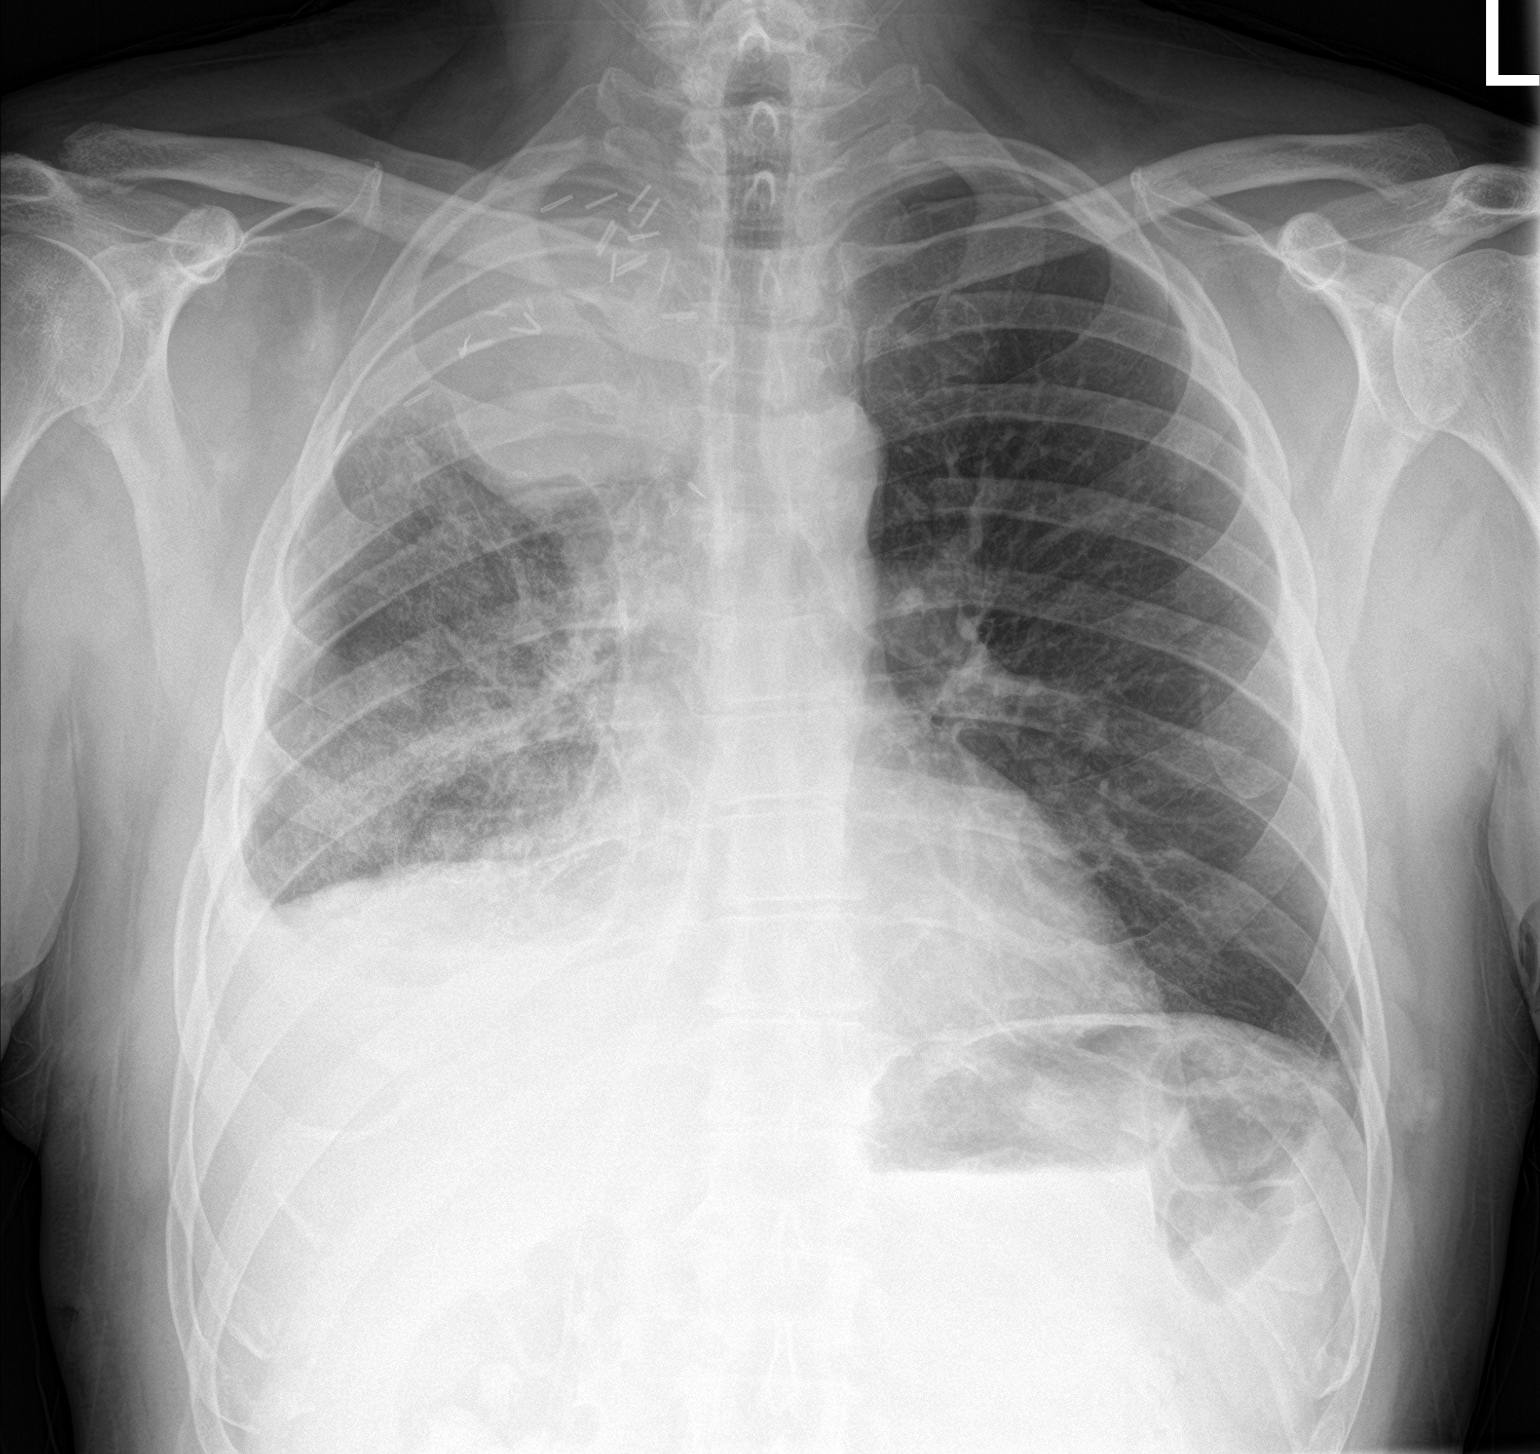

[chest lat]
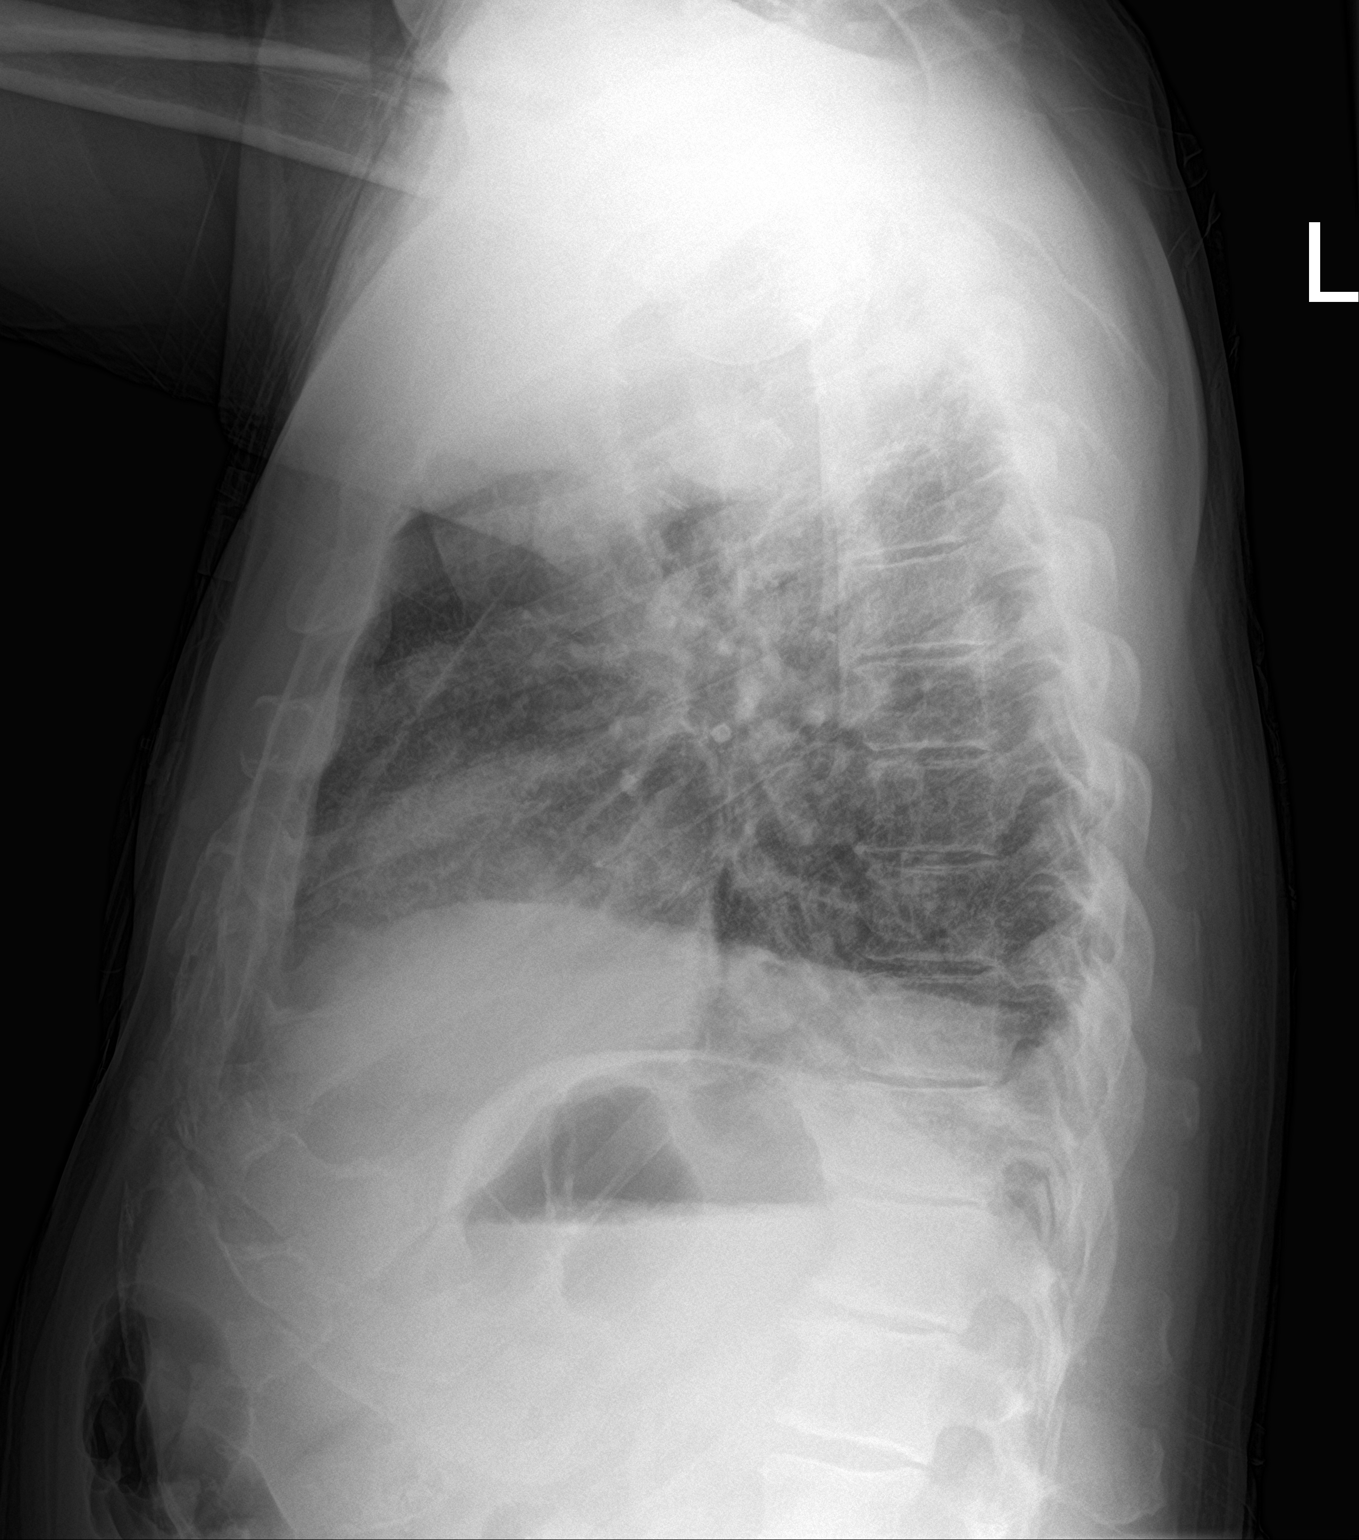

[2 of 2 positions shown; findings below may reference images not displayed]

FINDINGS: Cardiac shadow is stable. Left lung remains clear. Right lung
demonstrates right-sided pleural effusion and masslike density
superiorly with postsurgical changes. This represents pleural fluid
loculated from prior resection and stable in appearance. Some
increasing parenchymal density is noted throughout the aerated lung
which may represent hemorrhage given the history of hemoptysis. No
new mass lesion is seen.
IMPRESSION: Stable masslike density in the right apex consisting of loculated
fluid following recent surgery.

Increasing interstitial opacity within the right lung which may
represent some hemorrhage given the hemoptysis.

Small basilar right sided effusion.

## 2021-02-25 NOTE — ED Notes (Addendum)
Pt's pulse ox 86% RA, placed pt on 3L [place] and pulse ox is now 93%

## 2021-02-25 NOTE — ED Triage Notes (Signed)
Patient with hx of R partial lobectomy, has been coughing up blood since about noon, increased shortness of breath, denies home 02, now on 3L [place]. Denies fever and chills

## 2021-02-25 NOTE — ED Provider Notes (Signed)
Emergency Medicine Provider Triage Evaluation Note  Christopher Carter , a 60 y.o. male  was evaluated in triage.  Pt complains of hemoptysis that started at noon today.  Patient with right upper lobectomy in May 2022 for adenocarcinoma with chest wall dissection by Dr. Roxan Hockey.  Chest tube removed on 5/19.  Patient was seen in the office this week by surgeon on 6/14 and found to have pleural effusion.  Plan was for procedure to drain the fluid on 6/21, presumptively thoracentesis.  Patient states that he began coughing suddenly at noon today, coughing up blood clots about a quarter in size and having intractable coughing since then.  Endorses chest discomfort with coughing but no true chest pain or palpitations.  Endorses shortness of breath.  Was found to be hypoxic to 86% on room air this evening on intake in the ED.  He is not on any anticoagulation.  States he also may have vomited blood, but states this might just have been from him coughing it up and swallowing it.  Review of Systems  Positive: Cough, shortness of breath, hemoptysis, chest discomfort, nausea Negative: Fevers, chills syncope  Physical Exam  BP (!) 143/93 (BP Location: Left Arm)   Pulse (!) 105   Temp 98.6 F (37 C) (Oral)   Resp 18   SpO2 93%  Gen:   Awake, no distress   Resp:  Normal effort  MSK:   Moves extremities without difficulty  Other:  Rales in the lung fields bilaterally, most significantly in the right upper lobe .  Mildly tachycardic.  Hypoxic on room air to 86%, requiring 3 L by nasal cannula for maintenance of saturations. No LE edema.  Medical Decision Making  Medically screening exam initiated at 9:41 PM.  Appropriate orders placed.  Christopher Carter was informed that the remainder of the evaluation will be completed by another provider, this initial triage assessment does not replace that evaluation, and the importance of remaining in the ED until their evaluation is complete.  This chart was  dictated using voice recognition software, Dragon. Despite the best efforts of this provider to proofread and correct errors, errors may still occur which can change documentation meaning.    Aura Dials 02/25/21 [id number]    Ripley Fraise, MD 02/26/21 [phone]

## 2021-02-26 ENCOUNTER — Encounter (HOSPITAL_COMMUNITY): Payer: Self-pay | Admitting: Internal Medicine

## 2021-02-26 ENCOUNTER — Inpatient Hospital Stay (HOSPITAL_COMMUNITY): Payer: 59

## 2021-02-26 ENCOUNTER — Emergency Department (HOSPITAL_COMMUNITY): Payer: 59

## 2021-02-26 DIAGNOSIS — R0489 Hemorrhage from other sites in respiratory passages: Secondary | ICD-10-CM | POA: Diagnosis present

## 2021-02-26 DIAGNOSIS — Z902 Acquired absence of lung [part of]: Secondary | ICD-10-CM | POA: Diagnosis not present

## 2021-02-26 DIAGNOSIS — R042 Hemoptysis: Secondary | ICD-10-CM | POA: Diagnosis present

## 2021-02-26 DIAGNOSIS — Y92009 Unspecified place in unspecified non-institutional (private) residence as the place of occurrence of the external cause: Secondary | ICD-10-CM | POA: Diagnosis not present

## 2021-02-26 DIAGNOSIS — Z20822 Contact with and (suspected) exposure to covid-19: Secondary | ICD-10-CM | POA: Diagnosis present

## 2021-02-26 DIAGNOSIS — Z9689 Presence of other specified functional implants: Secondary | ICD-10-CM | POA: Diagnosis not present

## 2021-02-26 DIAGNOSIS — C3491 Malignant neoplasm of unspecified part of right bronchus or lung: Secondary | ICD-10-CM | POA: Diagnosis present

## 2021-02-26 DIAGNOSIS — A419 Sepsis, unspecified organism: Secondary | ICD-10-CM | POA: Diagnosis present

## 2021-02-26 DIAGNOSIS — S2241XA Multiple fractures of ribs, right side, initial encounter for closed fracture: Secondary | ICD-10-CM | POA: Diagnosis present

## 2021-02-26 DIAGNOSIS — J9601 Acute respiratory failure with hypoxia: Secondary | ICD-10-CM | POA: Diagnosis present

## 2021-02-26 DIAGNOSIS — Z79899 Other long term (current) drug therapy: Secondary | ICD-10-CM | POA: Diagnosis not present

## 2021-02-26 DIAGNOSIS — J159 Unspecified bacterial pneumonia: Secondary | ICD-10-CM | POA: Diagnosis present

## 2021-02-26 DIAGNOSIS — Z885 Allergy status to narcotic agent status: Secondary | ICD-10-CM | POA: Diagnosis not present

## 2021-02-26 DIAGNOSIS — J942 Hemothorax: Secondary | ICD-10-CM | POA: Diagnosis present

## 2021-02-26 DIAGNOSIS — R6520 Severe sepsis without septic shock: Secondary | ICD-10-CM | POA: Diagnosis present

## 2021-02-26 DIAGNOSIS — F329 Major depressive disorder, single episode, unspecified: Secondary | ICD-10-CM | POA: Diagnosis present

## 2021-02-26 DIAGNOSIS — J690 Pneumonitis due to inhalation of food and vomit: Secondary | ICD-10-CM | POA: Diagnosis not present

## 2021-02-26 DIAGNOSIS — Y92239 Unspecified place in hospital as the place of occurrence of the external cause: Secondary | ICD-10-CM | POA: Diagnosis not present

## 2021-02-26 DIAGNOSIS — K920 Hematemesis: Secondary | ICD-10-CM | POA: Diagnosis present

## 2021-02-26 DIAGNOSIS — D72829 Elevated white blood cell count, unspecified: Secondary | ICD-10-CM | POA: Diagnosis not present

## 2021-02-26 DIAGNOSIS — C3411 Malignant neoplasm of upper lobe, right bronchus or lung: Secondary | ICD-10-CM | POA: Diagnosis present

## 2021-02-26 DIAGNOSIS — J90 Pleural effusion, not elsewhere classified: Secondary | ICD-10-CM | POA: Diagnosis present

## 2021-02-26 DIAGNOSIS — J9621 Acute and chronic respiratory failure with hypoxia: Secondary | ICD-10-CM | POA: Diagnosis present

## 2021-02-26 DIAGNOSIS — J189 Pneumonia, unspecified organism: Secondary | ICD-10-CM | POA: Diagnosis not present

## 2021-02-26 DIAGNOSIS — Z888 Allergy status to other drugs, medicaments and biological substances status: Secondary | ICD-10-CM | POA: Diagnosis not present

## 2021-02-26 DIAGNOSIS — I10 Essential (primary) hypertension: Secondary | ICD-10-CM

## 2021-02-26 DIAGNOSIS — Z7982 Long term (current) use of aspirin: Secondary | ICD-10-CM | POA: Diagnosis not present

## 2021-02-26 DIAGNOSIS — T486X5A Adverse effect of antiasthmatics, initial encounter: Secondary | ICD-10-CM | POA: Diagnosis present

## 2021-02-26 DIAGNOSIS — J9382 Other air leak: Secondary | ICD-10-CM | POA: Diagnosis not present

## 2021-02-26 DIAGNOSIS — Y95 Nosocomial condition: Secondary | ICD-10-CM | POA: Diagnosis present

## 2021-02-26 DIAGNOSIS — E872 Acidosis: Secondary | ICD-10-CM | POA: Diagnosis present

## 2021-02-26 DIAGNOSIS — T380X5A Adverse effect of glucocorticoids and synthetic analogues, initial encounter: Secondary | ICD-10-CM | POA: Diagnosis not present

## 2021-02-26 LAB — RESP PANEL BY RT-PCR (FLU A&B, COVID) ARPGX2
Influenza A by PCR: NEGATIVE
Influenza B by PCR: NEGATIVE
SARS Coronavirus 2 by RT PCR: NEGATIVE

## 2021-02-26 LAB — LACTIC ACID, PLASMA
Lactic Acid, Venous: 1.6 mmol/L (ref 0.5–1.9)
Lactic Acid, Venous: 3.7 mmol/L (ref 0.5–1.9)
Lactic Acid, Venous: 1.1 mmol/L (ref 0.5–1.9)

## 2021-02-26 LAB — I-STAT ARTERIAL BLOOD GAS, ED
Acid-Base Excess: 4 mmol/L — ABNORMAL HIGH (ref 0.0–2.0)
Bicarbonate: 28.7 mmol/L — ABNORMAL HIGH (ref 20.0–28.0)
Calcium, Ion: 1.2 mmol/L (ref 1.15–1.40)
HCT: 34 % — ABNORMAL LOW (ref 39.0–52.0)
Hemoglobin: 11.6 g/dL — ABNORMAL LOW (ref 13.0–17.0)
O2 Saturation: 96 %
Patient temperature: 98.6
Potassium: 3.6 mmol/L (ref 3.5–5.1)
Sodium: 139 mmol/L (ref 135–145)
TCO2: 30 mmol/L (ref 22–32)
pCO2 arterial: 40.5 mmHg (ref 32.0–48.0)
pH, Arterial: 7.458 — ABNORMAL HIGH (ref 7.350–7.450)
pO2, Arterial: 79 mmHg — ABNORMAL LOW (ref 83.0–108.0)

## 2021-02-26 LAB — GLUCOSE, CAPILLARY
Glucose-Capillary: 135 mg/dL — ABNORMAL HIGH (ref 70–99)
Glucose-Capillary: 147 mg/dL — ABNORMAL HIGH (ref 70–99)
Glucose-Capillary: 135 mg/dL — ABNORMAL HIGH (ref 70–99)

## 2021-02-26 LAB — HIV ANTIBODY (ROUTINE TESTING W REFLEX): HIV Screen 4th Generation wRfx: NONREACTIVE

## 2021-02-26 LAB — STREP PNEUMONIAE URINARY ANTIGEN: Strep Pneumo Urinary Antigen: NEGATIVE

## 2021-02-26 LAB — MRSA NEXT GEN BY PCR, NASAL: MRSA by PCR Next Gen: NOT DETECTED

## 2021-02-26 IMAGING — CT CT IMAGE GUIDED DRAINAGE BY PERCUTANEOUS CATHETER
1 of 5 series · 12 of 32 positions shown, 18 images · non-contrast
Comparison: none

INDICATION: 59-year-old male with a history recent surgery, presentation with
hemoptysis, and loculated fluid of the surgery site. He has been
referred for CT-guided chest tube placement
TECHNIQUE: Informed written consent was obtained from the patient after a
thorough discussion of the procedural risks, benefits and
alternatives. All questions were addressed. Maximal Sterile Barrier
Technique was utilized including caps, mask, sterile gowns, sterile
gloves, sterile drape, hand hygiene and skin antiseptic. A timeout
was performed prior to the initiation of the procedure.

[Series 2: i-spiral 5.0 b40f · axial · 0.85mm/px · z∈[+1188,+1434]mm · 12 of 82 slices shown, 18 images]
[im 6/82  soft-tissue]
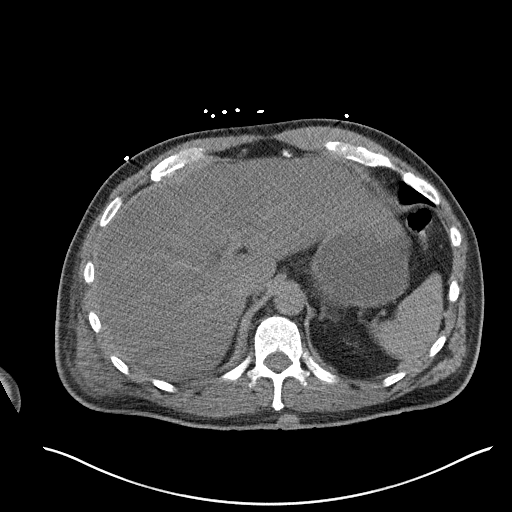
[im 6/82  bone]
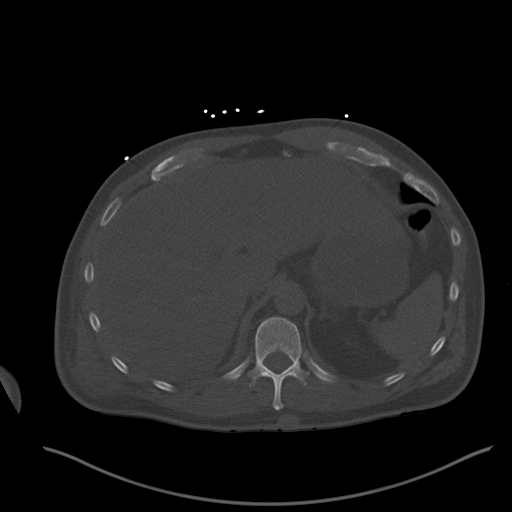
[im 12/82  soft-tissue]
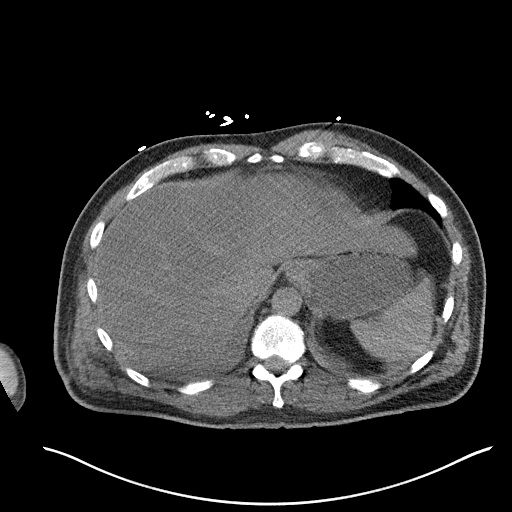
[im 18/82  soft-tissue]
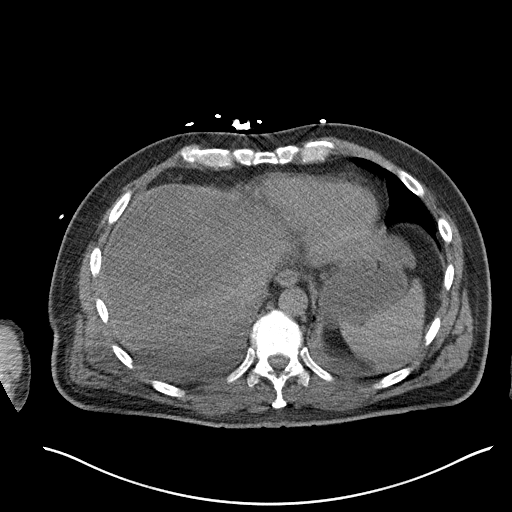
[im 24/82  soft-tissue]
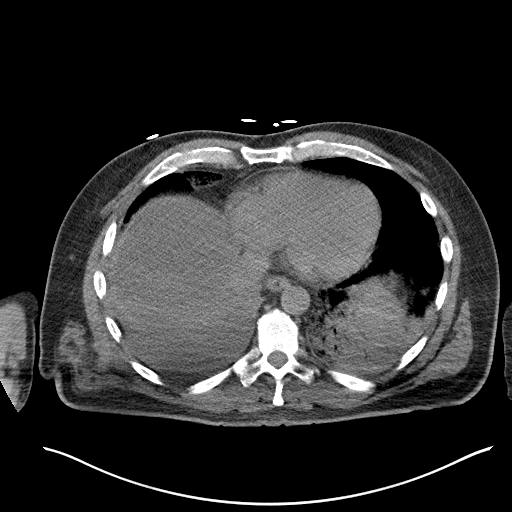
[im 29/82  soft-tissue]
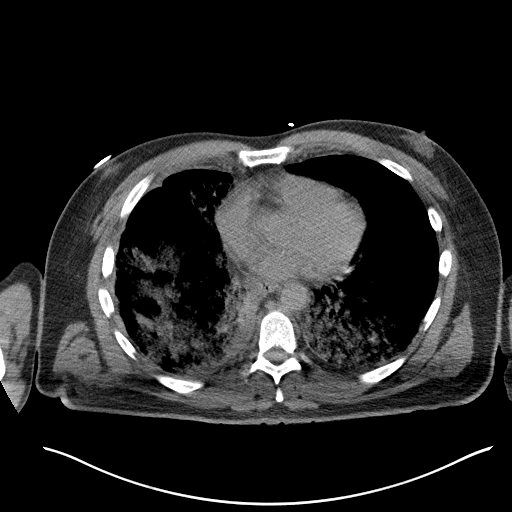
[im 35/82  soft-tissue]
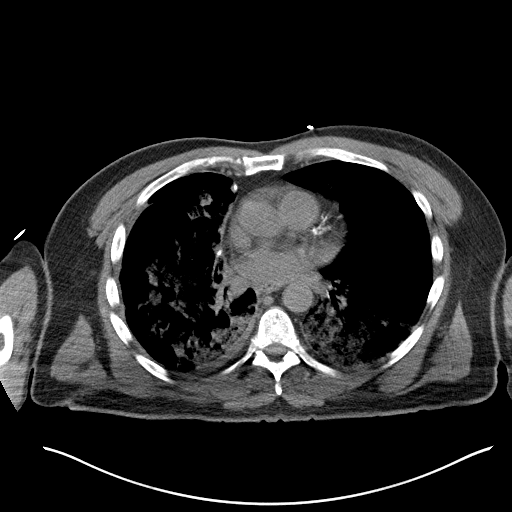
[im 47/82  soft-tissue]
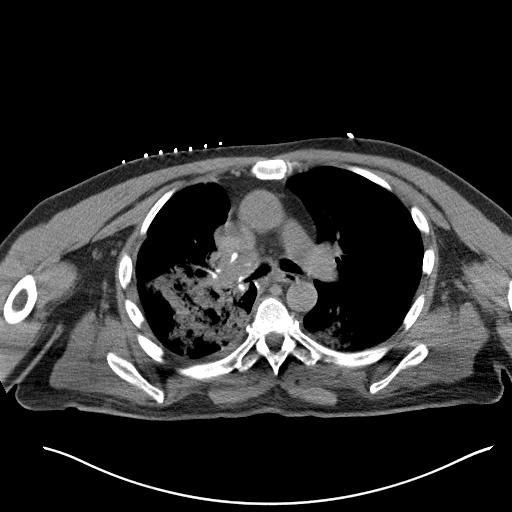
[im 53/82  soft-tissue]
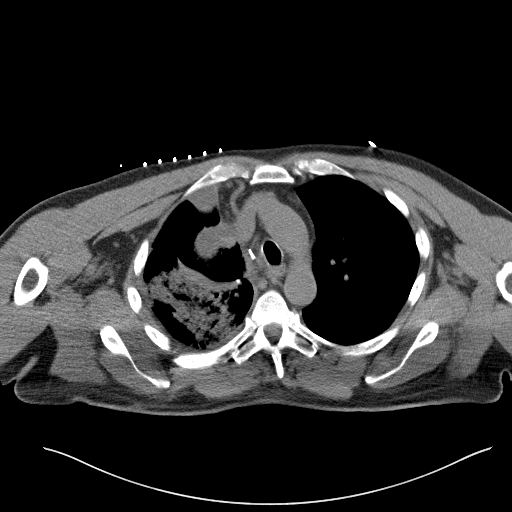
[im 58/82  soft-tissue]
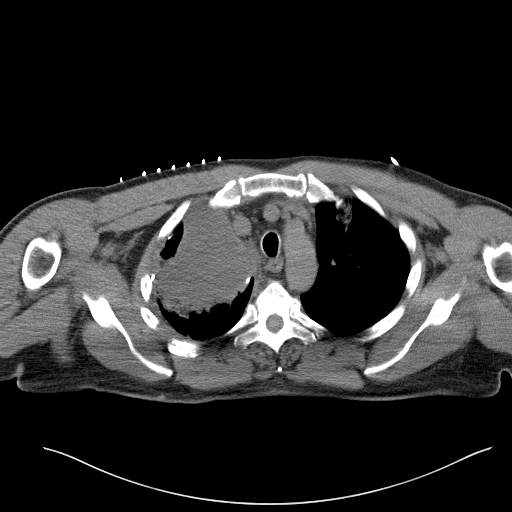
[im 58/82  lung]
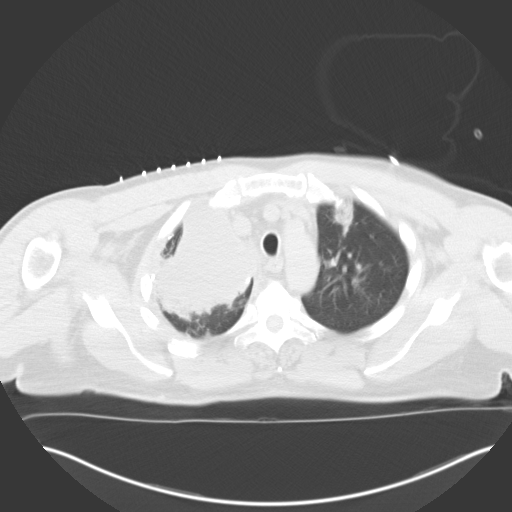
[im 58/82  bone]
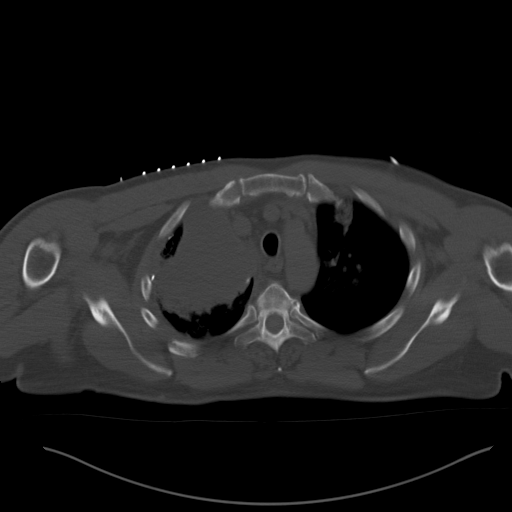
[im 64/82  soft-tissue]
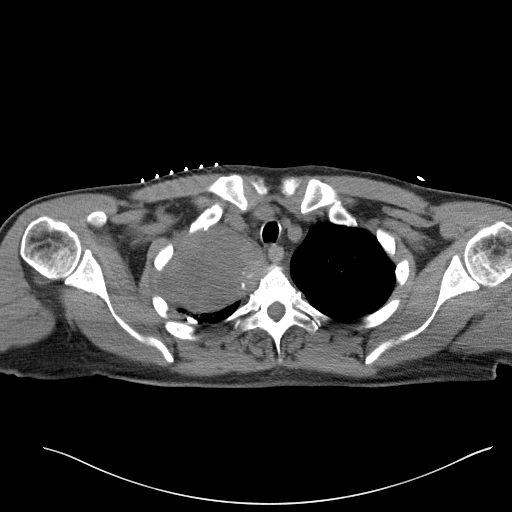
[im 64/82  lung]
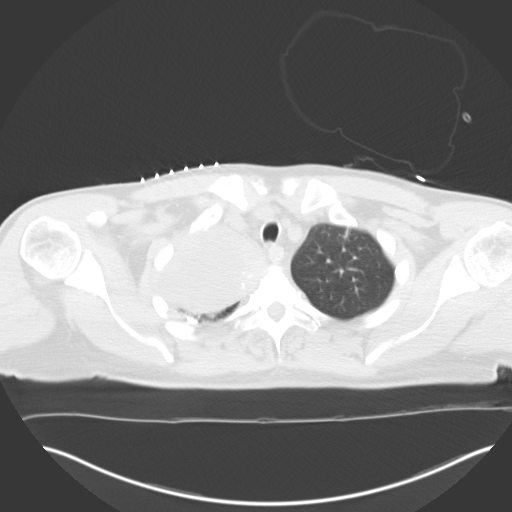
[im 70/82  soft-tissue]
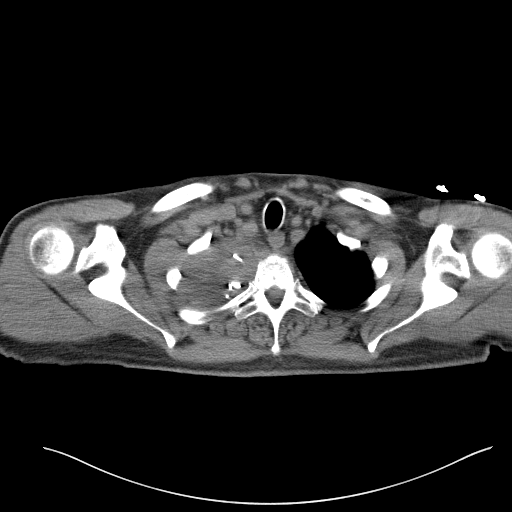
[im 70/82  lung]
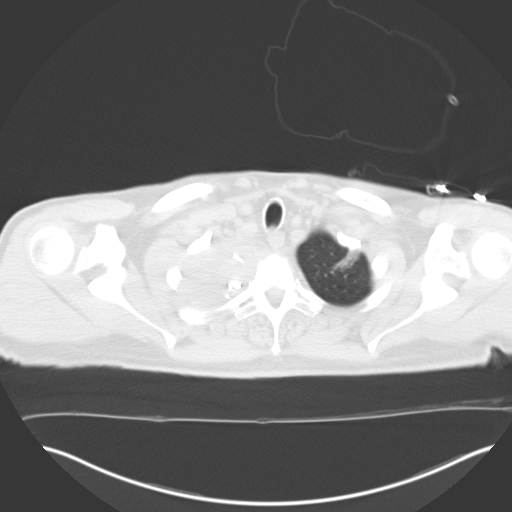
[im 76/82  soft-tissue]
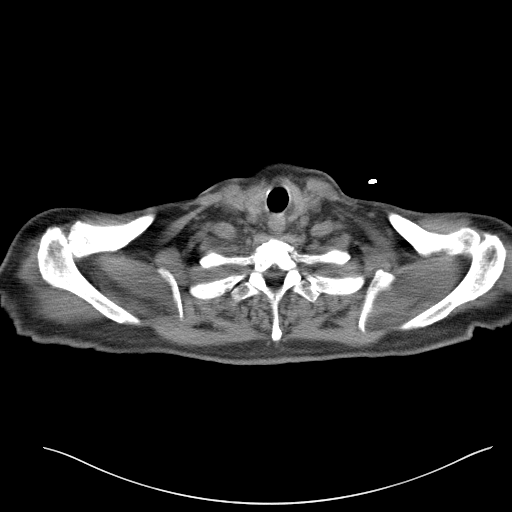
[im 76/82  lung]
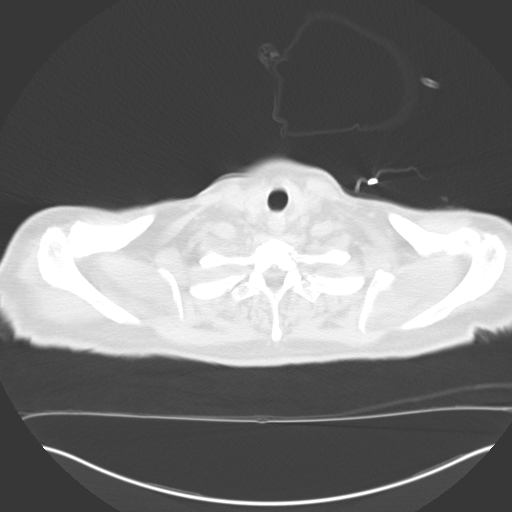

[12 of 32 positions shown; findings below may reference images not displayed]

EXAM:
CT-GUIDED CHEST TUBE

MEDICATIONS:
The patient is currently admitted to the hospital and receiving
intravenous antibiotics. The antibiotics were administered within an
appropriate time frame prior to the initiation of the procedure.

ANESTHESIA/SEDATION:
1.0 mg IV Versed 25 mcg IV Fentanyl

Moderate Sedation Time:  16 minutes

The patient was continuously monitored during the procedure by the
interventional radiology nurse under my direct supervision.

COMPLICATIONS:
None
PROCEDURE:
Patient was positioned in the supine position on the CT gantry table
and a scout CT of the chest was performed for planning purposes.

Once angle of approach was determined, the skin and subcutaneous
tissues this scan was prepped and draped in the usual sterile
fashion, and a sterile drape was applied covering the operative
field. A sterile gown and sterile gloves were used for the
procedure. Local anesthesia was provided with 1% Lidocaine.

The skin and subcutaneous tissues were infiltrated 1% lidocaine for
local anesthesia, and a small stab incision was made with an 11
blade scalpel.

Using CT guidance, a 18 gauge trocar needle was advanced into the
pleural space of the right apex. After confirmation of the tip,
modified Seldinger technique was used to place a 10 French pigtail
drainage catheter. Approximately 60 cc of reddish thin fluid
aspirated. Culture was sent to the lab for analysis.

Catheter was then attached to water seal chamber and suction was
applied confirming a operational chest tube.

Retention suture was placed.  Sterile dressing was placed.

Patient tolerated the procedure well and remained hemodynamically
stable throughout.

No complications were encountered and no significant blood loss was
encounter
FINDINGS: The scout CT demonstrates essentially a similar distribution and
volume of confluent ground-glass opacity of the right lower lobe,
right middle lobe, and left lower lobe. No significant fluid
identified within the airways. Similar appearance of the loculated
fluid collection within the surgical site at the right apex.

After placement of the chest tube approximately 60 cc of thin
reddish fluid aspirated which appears to be serosanguineous. No
frank abscess. Culture was sent.
IMPRESSION: Status post CT-guided right apical chest tube into loculated fluid
revealing serosanguineous fluid. Culture was sent.

## 2021-02-26 IMAGING — CT CT ANGIO CHEST
2 of 6 series · 18 of 36 positions shown · IV contrast (omnipaque)
Comparison: CT chest [DATE]

CLINICAL DATA: hx of R partial lobectomy, has been coughing up
blood since about noon, increased shortness of breath, denies home
02, now on 3L [HOSPITAL]. Pulmonary embolus suspected. VATS and thoracotomy
surgery [DATE]

EXAM:
CT ANGIOGRAPHY CHEST WITH CONTRAST
TECHNIQUE: Multidetector CT imaging of the chest was performed using the
standard protocol during bolus administration of intravenous
contrast. Multiplanar CT image reconstructions and MIPs were
obtained to evaluate the vascular anatomy.
CONTRAST:  50mL OMNIPAQUE IOHEXOL 350 MG/ML SOLN

[Series 7: pe thins · axial · 0.82mm/px · z∈[-315,-58]mm · 17 of 409 slices shown]
[im 21/409  lung]
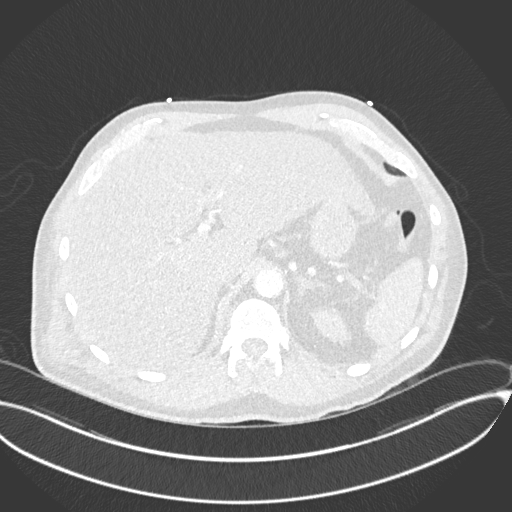
[im 41/409  mediastinal]
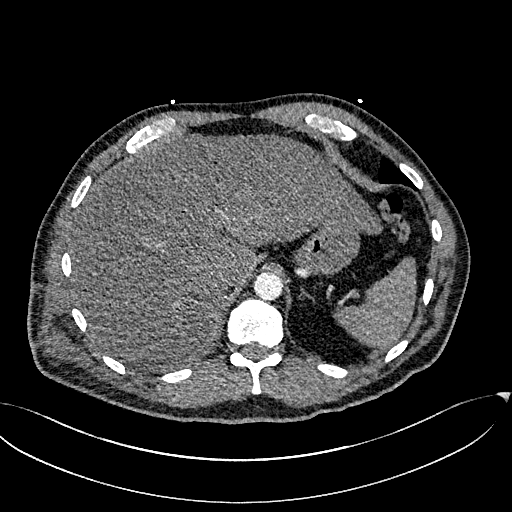
[im 62/409  lung]
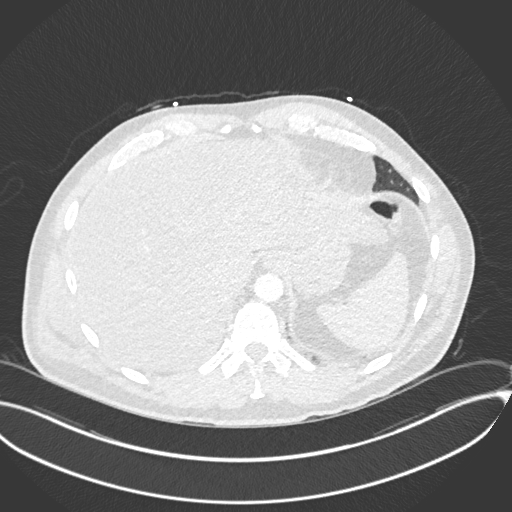
[im 82/409  mediastinal]
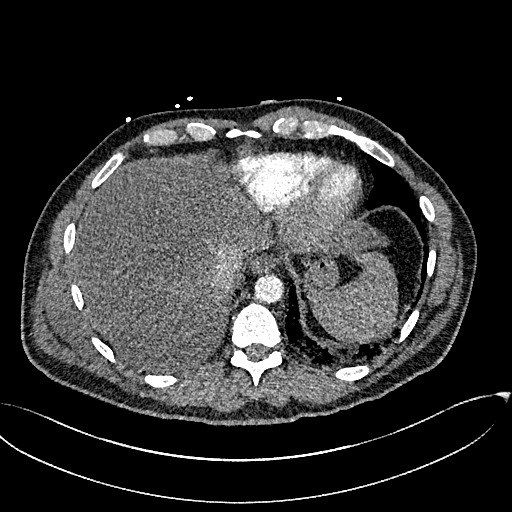
[im 123/409  lung]
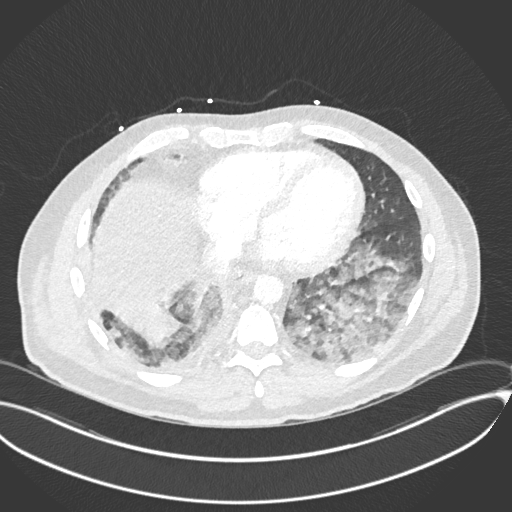
[im 143/409  mediastinal]
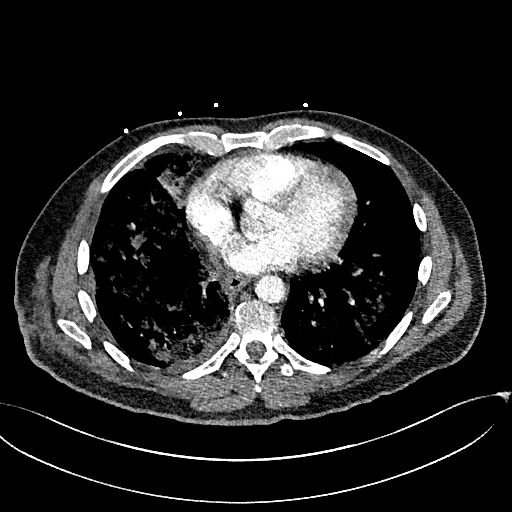
[im 164/409  lung]
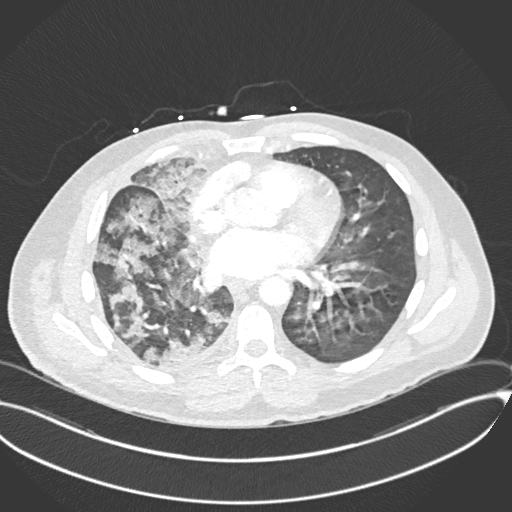
[im 184/409  mediastinal]
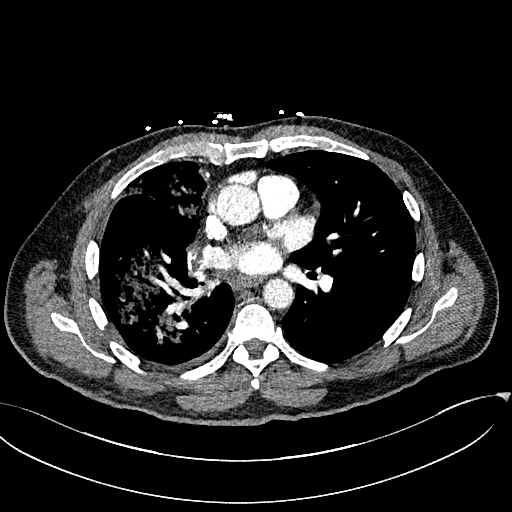
[im 205/409  lung]
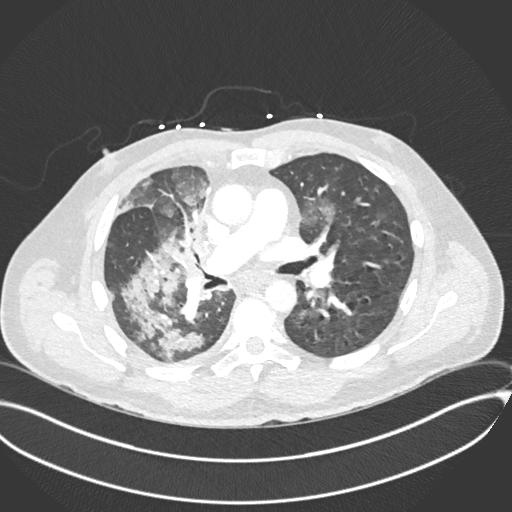
[im 225/409  mediastinal]
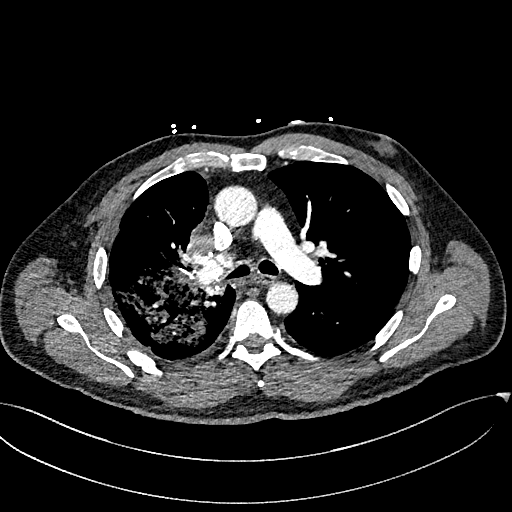
[im 245/409  lung]
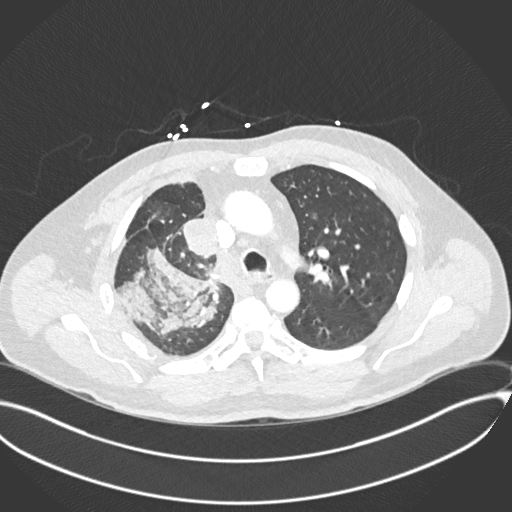
[im 266/409  mediastinal]
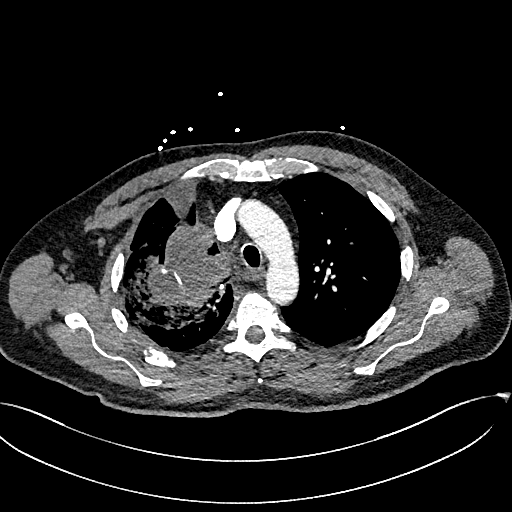
[im 286/409  lung]
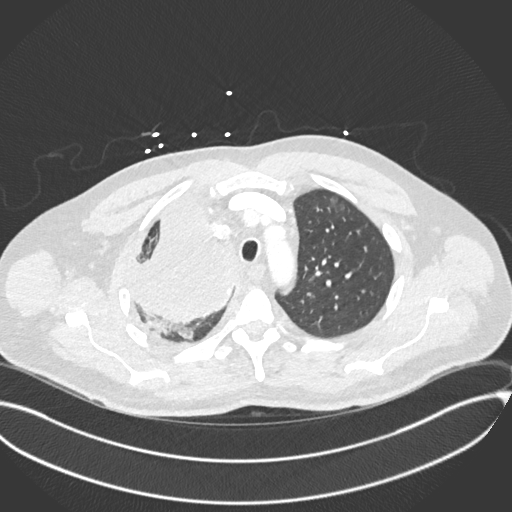
[im 327/409  mediastinal]
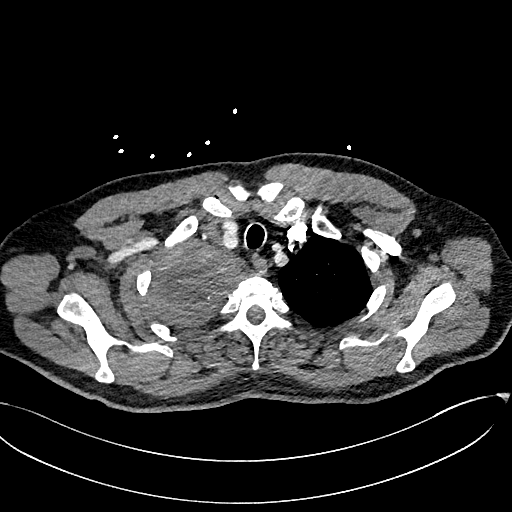
[im 347/409  lung]
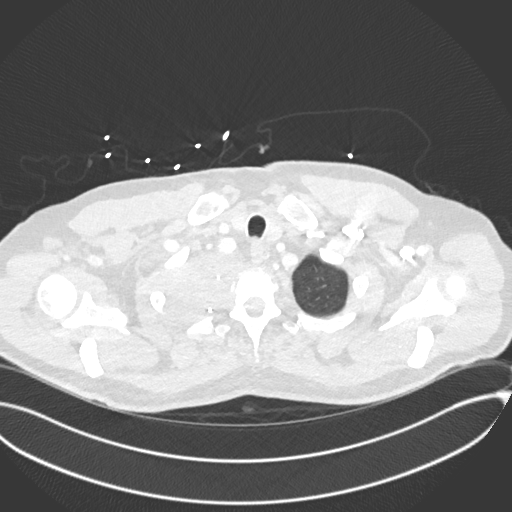
[im 368/409  mediastinal]
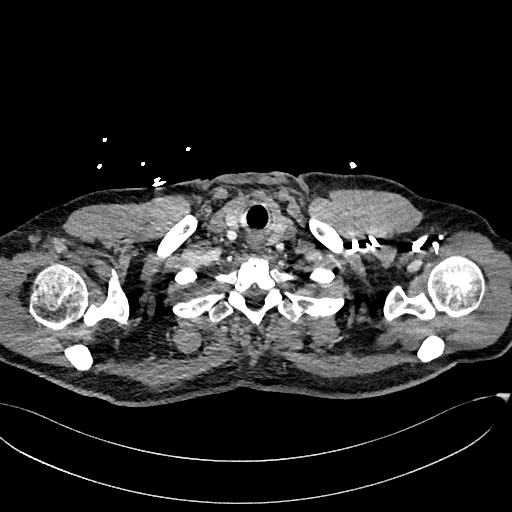
[im 388/409  lung]
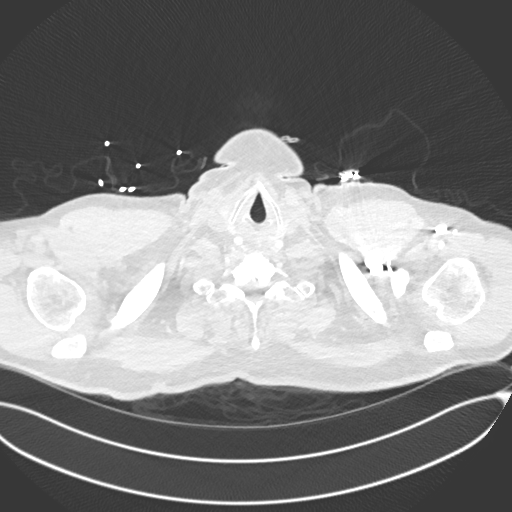

[Series 8: pe 2mm cor · coronal · 0.59mm/px · 1 of 156 slices shown]
[im 78/156  mediastinal]
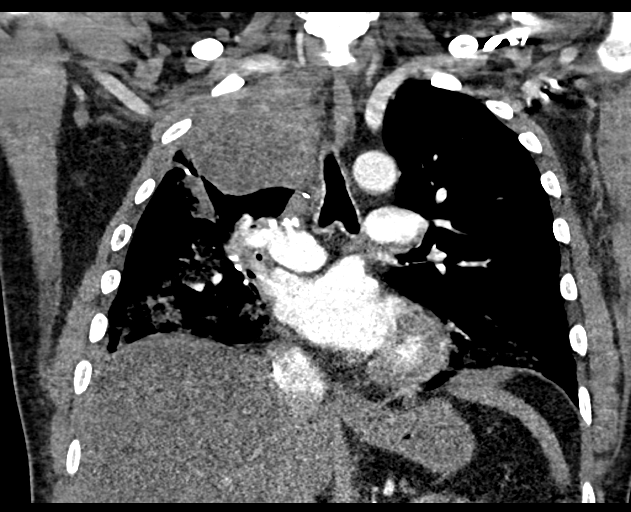

[18 of 36 positions shown; findings below may reference images not displayed]

FINDINGS: Cardiovascular: Satisfactory opacification of the pulmonary arteries
to the segmental level. No evidence of pulmonary embolism. Normal
heart size. No pericardial effusion. The thoracic aorta is normal in
caliber. Coronary artery calcifications are noted.

Mediastinum/Nodes: No enlarged mediastinal, hilar, or axillary lymph
nodes. Thyroid gland, trachea, and esophagus demonstrate no
significant findings.

Lungs/Pleura: Surgical changes related to a partial right lobectomy
of the right upper lobe with associated loculated right apical
moderate-sized pleural effusion. Persistent trace basilar pleural
effusion. Diffuse, right greater than left as well as lower lobe
greater than upper lobe, peribronchovascular ground-glass and
consolidative opacities. Some peripheral airspace opacities are also
noted.

Upper Abdomen: No acute abnormality.

Musculoskeletal:

No chest wall abnormality.

No suspicious lytic or blastic osseous lesions. No acute displaced
fracture. Redemonstration of subacute right comminuted fifth as well
as right nondisplaced sixth rib fractures that are broken in 2
separate places. Multilevel degenerative changes of the spine.

Review of the MIP images confirms the above findings.
IMPRESSION: 1. No pulmonary embolus.
2. Diffuse pulmonary findings findings suggestive of alveolar
hemorrhage versus infection/inflammation versus pulmonary edema.
3. Similar-appearing loculated moderate sized right apical pleural
effusion with limited evaluation due to timing of intravenous
contrast. Persistent trace right basilar pleural effusion.
4. Subacute right comminuted fifth as well as right nondisplaced
sixth rib fractures that are broken in 2 separate places consistent
with history of thoracotomy.
5. Coronary artery calcifications.

## 2021-02-26 MED ORDER — FENTANYL CITRATE (PF) 100 MCG/2ML IJ SOLN
50.0000 ug | INTRAMUSCULAR | Status: DC | PRN
  Administered 2021-02-26 – 2021-03-05 (×36): 50 ug via INTRAVENOUS
  Filled 2021-02-26 (×37): qty 2

## 2021-02-26 MED ORDER — ACETAMINOPHEN 325 MG PO TABS
650.0000 mg | ORAL_TABLET | Freq: Four times a day (QID) | ORAL | Status: DC | PRN
  Administered 2021-02-28 – 2021-03-02 (×9): 650 mg via ORAL
  Filled 2021-02-26 (×9): qty 2

## 2021-02-26 MED ORDER — LACTATED RINGERS IV BOLUS (SEPSIS)
1000.0000 mL | Freq: Once | INTRAVENOUS | Status: AC
  Administered 2021-02-26: 1000 mL via INTRAVENOUS

## 2021-02-26 MED ORDER — FENTANYL CITRATE (PF) 100 MCG/2ML IJ SOLN
INTRAMUSCULAR | Status: AC | PRN
  Administered 2021-02-26: 25 ug via INTRAVENOUS

## 2021-02-26 MED ORDER — CALCIUM CARBONATE ANTACID 500 MG PO CHEW
1.0000 | CHEWABLE_TABLET | Freq: Three times a day (TID) | ORAL | Status: DC
  Administered 2021-02-26 – 2021-03-07 (×26): 200 mg via ORAL
  Filled 2021-02-26 (×27): qty 1

## 2021-02-26 MED ORDER — ONDANSETRON HCL 4 MG/2ML IJ SOLN
4.0000 mg | Freq: Once | INTRAMUSCULAR | Status: AC
  Administered 2021-02-26: 4 mg via INTRAVENOUS
  Filled 2021-02-26: qty 2

## 2021-02-26 MED ORDER — ONDANSETRON HCL 4 MG/2ML IJ SOLN
4.0000 mg | Freq: Four times a day (QID) | INTRAMUSCULAR | Status: DC | PRN
  Administered 2021-02-26 – 2021-03-02 (×4): 4 mg via INTRAVENOUS
  Filled 2021-02-26 (×4): qty 2

## 2021-02-26 MED ORDER — SODIUM CHLORIDE 0.9 % IV SOLN: 2.0000 g | INTRAVENOUS | Status: DC

## 2021-02-26 MED ORDER — IPRATROPIUM-ALBUTEROL 0.5-2.5 (3) MG/3ML IN SOLN
3.0000 mL | Freq: Once | RESPIRATORY_TRACT | Status: AC
  Administered 2021-02-26: 3 mL via RESPIRATORY_TRACT

## 2021-02-26 MED ORDER — SODIUM CHLORIDE 0.9 % IV SOLN
2.0000 g | Freq: Three times a day (TID) | INTRAVENOUS | Status: AC
  Administered 2021-02-26 – 2021-03-05 (×23): 2 g via INTRAVENOUS
  Filled 2021-02-26 (×23): qty 2

## 2021-02-26 MED ORDER — IOHEXOL 350 MG/ML SOLN
50.0000 mL | Freq: Once | INTRAVENOUS | Status: AC | PRN
  Administered 2021-02-26: 50 mL via INTRAVENOUS

## 2021-02-26 MED ORDER — SODIUM CHLORIDE 0.9 % IV SOLN
500.0000 mg | Freq: Once | INTRAVENOUS | Status: AC
  Administered 2021-02-26: 500 mg via INTRAVENOUS
  Filled 2021-02-26: qty 500

## 2021-02-26 MED ORDER — POLYETHYLENE GLYCOL 3350 17 G PO PACK: 17.0000 g | PACK | Freq: Every day | ORAL | Status: DC | PRN

## 2021-02-26 MED ORDER — FENTANYL CITRATE (PF) 100 MCG/2ML IJ SOLN
25.0000 ug | INTRAMUSCULAR | Status: DC | PRN
Start: 2021-02-26 — End: 2021-03-02

## 2021-02-26 MED ORDER — ALUM & MAG HYDROXIDE-SIMETH 200-200-20 MG/5ML PO SUSP
30.0000 mL | ORAL | Status: DC | PRN
  Administered 2021-02-26: 30 mL via ORAL
  Filled 2021-02-26: qty 30

## 2021-02-26 MED ORDER — ACETAMINOPHEN 650 MG RE SUPP: 650.0000 mg | Freq: Four times a day (QID) | RECTAL | Status: DC | PRN

## 2021-02-26 MED ORDER — SERTRALINE HCL 100 MG PO TABS
100.0000 mg | ORAL_TABLET | Freq: Every day | ORAL | Status: DC
  Administered 2021-02-26 – 2021-03-07 (×10): 100 mg via ORAL
  Filled 2021-02-26 (×11): qty 1

## 2021-02-26 MED ORDER — FENTANYL CITRATE (PF) 100 MCG/2ML IJ SOLN
100.0000 ug | Freq: Once | INTRAMUSCULAR | Status: AC
  Administered 2021-02-26: 100 ug via INTRAVENOUS
  Filled 2021-02-26: qty 2

## 2021-02-26 MED ORDER — LACTATED RINGERS IV SOLN: INTRAVENOUS | Status: DC

## 2021-02-26 MED ORDER — IPRATROPIUM-ALBUTEROL 0.5-2.5 (3) MG/3ML IN SOLN
3.0000 mL | Freq: Once | RESPIRATORY_TRACT | Status: AC
  Administered 2021-02-26: 3 mL via RESPIRATORY_TRACT
  Filled 2021-02-26: qty 3

## 2021-02-26 MED ORDER — SODIUM CHLORIDE 0.9 % IV SOLN
12.5000 mg | Freq: Four times a day (QID) | INTRAVENOUS | Status: DC | PRN
  Filled 2021-02-26: qty 0.5

## 2021-02-26 MED ORDER — ONDANSETRON HCL 4 MG PO TABS
4.0000 mg | ORAL_TABLET | Freq: Four times a day (QID) | ORAL | Status: DC | PRN
Start: 2021-02-26 — End: 2021-03-07

## 2021-02-26 MED ORDER — SODIUM CHLORIDE 0.9 % IV SOLN
INTRAVENOUS | Status: AC | PRN
  Administered 2021-02-26: 250 mL via INTRAVENOUS

## 2021-02-26 MED ORDER — MIDAZOLAM HCL 2 MG/2ML IJ SOLN
INTRAMUSCULAR | Status: AC
  Filled 2021-02-26: qty 2

## 2021-02-26 MED ORDER — SODIUM CHLORIDE 0.9 % IV SOLN
2.0000 g | Freq: Once | INTRAVENOUS | Status: AC
  Administered 2021-02-26: 2 g via INTRAVENOUS
  Filled 2021-02-26: qty 20

## 2021-02-26 MED ORDER — FENTANYL CITRATE (PF) 100 MCG/2ML IJ SOLN
INTRAMUSCULAR | Status: AC
  Filled 2021-02-26: qty 2

## 2021-02-26 MED ORDER — POTASSIUM CHLORIDE 2 MEQ/ML IV SOLN
INTRAVENOUS | Status: AC
  Filled 2021-02-26 (×3): qty 1000

## 2021-02-26 MED ORDER — AMLODIPINE BESYLATE 10 MG PO TABS
10.0000 mg | ORAL_TABLET | Freq: Every day | ORAL | Status: DC
  Administered 2021-02-26 – 2021-03-07 (×10): 10 mg via ORAL
  Filled 2021-02-26 (×3): qty 1
  Filled 2021-02-26: qty 2
  Filled 2021-02-26 (×6): qty 1

## 2021-02-26 MED ORDER — MIDAZOLAM HCL 2 MG/2ML IJ SOLN
INTRAMUSCULAR | Status: AC | PRN
  Administered 2021-02-26: 1 mg via INTRAVENOUS

## 2021-02-26 MED ORDER — VANCOMYCIN HCL 1250 MG/250ML IV SOLN
1250.0000 mg | Freq: Two times a day (BID) | INTRAVENOUS | Status: AC
  Administered 2021-02-26 – 2021-02-28 (×6): 1250 mg via INTRAVENOUS
  Filled 2021-02-26 (×6): qty 250

## 2021-02-26 MED ORDER — VANCOMYCIN HCL 1500 MG/300ML IV SOLN
1500.0000 mg | Freq: Once | INTRAVENOUS | Status: AC
  Administered 2021-02-26: 1500 mg via INTRAVENOUS
  Filled 2021-02-26: qty 300

## 2021-02-26 MED ORDER — ALBUTEROL SULFATE (2.5 MG/3ML) 0.083% IN NEBU
2.5000 mg | INHALATION_SOLUTION | RESPIRATORY_TRACT | Status: DC | PRN
  Administered 2021-02-28: 2.5 mg via RESPIRATORY_TRACT
  Filled 2021-02-26: qty 3

## 2021-02-26 MED ORDER — LATANOPROST 0.005 % OP SOLN
1.0000 [drp] | Freq: Every day | OPHTHALMIC | Status: DC
  Administered 2021-02-27 – 2021-03-06 (×8): 1 [drp] via OPHTHALMIC
  Filled 2021-02-26 (×2): qty 2.5

## 2021-02-26 MED ORDER — SODIUM CHLORIDE 0.9 % IV SOLN
500.0000 mg | INTRAVENOUS | Status: DC
  Administered 2021-02-27: 500 mg via INTRAVENOUS
  Filled 2021-02-26: qty 500

## 2021-02-26 MED ORDER — QUETIAPINE FUMARATE 50 MG PO TABS
50.0000 mg | ORAL_TABLET | Freq: Every day | ORAL | Status: DC
  Administered 2021-02-26 – 2021-03-06 (×8): 50 mg via ORAL
  Filled 2021-02-26 (×9): qty 1

## 2021-02-26 MED ORDER — GABAPENTIN 300 MG PO CAPS
300.0000 mg | ORAL_CAPSULE | Freq: Three times a day (TID) | ORAL | Status: DC
  Administered 2021-02-26 – 2021-03-01 (×12): 300 mg via ORAL
  Filled 2021-02-26 (×12): qty 1

## 2021-02-26 MED ORDER — IRBESARTAN 150 MG PO TABS
300.0000 mg | ORAL_TABLET | Freq: Every day | ORAL | Status: DC
  Administered 2021-02-26 – 2021-03-07 (×10): 300 mg via ORAL
  Filled 2021-02-26 (×3): qty 2
  Filled 2021-02-26: qty 1
  Filled 2021-02-26 (×7): qty 2

## 2021-02-26 NOTE — H&P (Signed)
Chief Complaint: Patient was seen in consultation today for image guided right chest tube placement Chief Complaint  Patient presents with   Hemoptysis   at the request of Dr. Roxan Hockey  Referring Physician(s): Dr. Roxan Hockey   Supervising Physician: Corrie Mckusick  Patient Status: [facility] - In-pt  History of Present Illness: Christopher Carter is a 60 y.o. 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 Koleen Nimrod on 01/22/2021.  Patient underwent follow-up CT chest on 02/16/2021 which showed:  1. Surgical changes from a right upper lobe lobectomy. 2. Moderate-sized right pleural effusion with loculated fluid at the right lung apex. 3. No worrisome pulmonary nodules to suggest pulmonary metastatic disease. 4. Stable age advanced coronary artery calcifications.  Patient was originally scheduled for a CT-guided aspiration of the loculated right pleural effusion as an outpatient tomorrow at [facility] IR, however, patient presented to [facility] ED on 02/25/2021 due to hemoptysis and shortness of breath.  Vital signs at the time of presentation to ED showed hypertension and tachycardia. Labs revealed markedly elevated WBC of 29.4, PLT 473, COVID-19 negative. Patient underwent CT angio chest which showed no pulmonary embolism, however, diffuse infiltrates concerning for alveolar hemorrhage with possible superimposed infectious process versus pulmonary edema.  Cardiothoracic surgery was consulted upon admission,  patient was started on IV antibiotics for suspected pneumonia and currently admitted.   IR was requested for an image guided chest tube placement for right loculated pleural effusion.   Patient laying in bed, not in acute distress. His wife and RN at the bedside.  Patient reports shortness of breath.  Denies hemoptysis. Denise headache, fever, chills, chest pain, abdominal pain, nausea ,vomiting, and  bleeding.   Past Medical History:  Diagnosis Date   Anxiety    Cancer ([place]) 12/2020   Depression    Hypertension    Pneumonia    1990's    Past Surgical History:  Procedure Laterality Date   ANKLE FRACTURE SURGERY Right    BRONCHIAL BIOPSY  01/05/2021   Procedure: BRONCHIAL BIOPSIES;  Surgeon: Collene Gobble, MD;  Location: [place];  Service: Cardiopulmonary;;   BRONCHIAL BRUSHINGS  01/05/2021   Procedure: BRONCHIAL BRUSHINGS;  Surgeon: Collene Gobble, MD;  Location: [place];  Service: Cardiopulmonary;;   BRONCHIAL NEEDLE ASPIRATION BIOPSY  01/05/2021   Procedure: BRONCHIAL NEEDLE ASPIRATION BIOPSIES;  Surgeon: Collene Gobble, MD;  Location: [place];  Service: Cardiopulmonary;;   COLON SURGERY  2017   bowel obstruction surgery per pt    ENDOBRONCHIAL ULTRASOUND N/A 01/05/2021   Procedure: ENDOBRONCHIAL ULTRASOUND;  Surgeon: Collene Gobble, MD;  Location: [place];  Service: Cardiopulmonary;  Laterality: N/A;   INTERCOSTAL NERVE BLOCK Right 01/22/2021   Procedure: INTERCOSTAL NERVE BLOCK;  Surgeon: [name], MD;  Location: [place];  Service: Thoracic;  Laterality: Right;   LAPAROSCOPY N/A 04/25/2017   Procedure: LAPAROSCOPY ASSISTED ILEO-CECECTOMY WITH SMALL BOWEL RESECTION WITH ANASTOMOSIS;  Surgeon: Excell Seltzer, MD;  Location: [facility];  Service: General;  Laterality: N/A;   LYMPH NODE DISSECTION Right 01/22/2021   Procedure: LYMPH NODE DISSECTION;  Surgeon: [name], MD;  Location: [place];  Service: Thoracic;  Laterality: Right;   THORACOTOMY Right 01/22/2021   Procedure: THORACOTOMY;  Surgeon: [name], MD;  Location: [place];  Service: Thoracic;  Laterality: Right;   VIDEO ASSISTED THORACOSCOPY (VATS)/ LOBECTOMY Right 01/22/2021   Procedure: VIDEO ASSISTED THORACOSCOPY (VATS)/RIGHT UPPER LOBECTOMY;  Surgeon: [name], MD;  Location: [place];  Service: Thoracic;  Laterality: Right;   VIDEO BRONCHOSCOPY N/A  01/05/2021   Procedure: VIDEO BRONCHOSCOPY WITH FLUORO;  Surgeon: Collene Gobble, MD;  Location: [place];  Service: Cardiopulmonary;  Laterality: N/A;    Allergies: Oxycodone hcl and Bupropion  Medications: Prior to Admission medications   Medication Sig Start Date End Date Taking? Authorizing Provider  acetaminophen (TYLENOL) 500 MG tablet Take 500 mg by mouth 2 (two) times daily as needed for moderate pain or headache.    [provider]  amLODipine (NORVASC) 10 MG tablet TAKE 1 TABLET BY MOUTH EVERY DAY 02/19/21   [name], MD  Ascorbic Acid (VITAMIN C) 1000 MG tablet Take 1,000 mg by mouth daily.    [provider]  aspirin EC 81 MG tablet Take 81 mg by mouth daily. Swallow whole.    [provider]  Coenzyme Q10 (COQ10) 100 MG CAPS Take 100 mg by mouth daily.    [provider]  gabapentin (NEURONTIN) 300 MG capsule Take 1 capsule (300 mg total) by mouth 3 (three) times daily. 02/13/21   [name], MD  HAWTHORNE BERRY PO Take 1 tablet by mouth daily.    [provider]  irbesartan (AVAPRO) 300 MG tablet Take 300 mg by mouth daily.    [provider]  latanoprost (XALATAN) 0.005 % ophthalmic solution Place 1 drop into both eyes at bedtime.    [provider]  Menthol, Topical Analgesic, (BIOFREEZE EX) Apply 1 application topically daily as needed (pain).    [provider]  OVER THE COUNTER MEDICATION Take 1 tablet by mouth 3 (three) times daily. Jointprin otc supplement    [provider]  QUEtiapine (SEROQUEL) 50 MG tablet Take 50 mg by mouth at bedtime. 11/06/20   [provider]  Red Yeast Rice Extract (RED YEAST RICE PO) Take 2 tablets by mouth daily.    [provider]  sertraline (ZOLOFT) 100 MG tablet Take 100 mg by mouth daily. 10/29/20   [provider]  traMADol (ULTRAM) 50 MG tablet Take 2 tablets (100 mg total) by mouth every 6 (six) hours as  needed for up to 7 days (mild pain). 02/24/21 03/03/21  [name], MD  TURMERIC PO Take 1 tablet by mouth daily.    [provider]  vitamin B-12 (CYANOCOBALAMIN) 1000 MCG tablet Take 1,000 mcg by mouth daily.    [provider]     History reviewed. No pertinent family history.  Social History   Socioeconomic History   Marital status: Married    Spouse name: Not on file   Number of children: Not on file   Years of education: Not on file   Highest education level: Not on file  Occupational History   Not on file  Tobacco Use   Smoking status: Never   Smokeless tobacco: Never  Vaping Use   Vaping Use: Never used  Substance and Sexual Activity   Alcohol use: Not Currently    Alcohol/week: 21.0 standard drinks    Types: 21 Shots of liquor per week    Comment: 3-4 shots of whiskey a day   Drug use: No   Sexual activity: Not on file  Other Topics Concern   Not on file  Social History Narrative   Not on file   Social Determinants of Health   Financial Resource Strain: Not on file  Food Insecurity: Not on file  Transportation Needs: Not on file  Physical Activity: Not on file  Stress: Not on file  Social Connections: Not on file     Review of Systems: A 12 point ROS discussed and pertinent positives are indicated in the HPI above.  All other systems are negative.   Vital Signs: BP (!) 158/85 (BP Location: Right Arm)   Pulse 99   Temp 98 F (36.7 C) (Oral)   Resp 18   Ht 5\' 10"  (1.778 m)   Wt 185 lb (83.9 kg)   SpO2 96%   BMI 26.54 kg/m   Physical Exam Vitals reviewed.  Constitutional:      General: He is not in acute distress.    Appearance: He is ill-appearing.  HENT:     Head: Normocephalic and atraumatic.     Mouth/Throat:     Mouth: Mucous membranes are moist.  Cardiovascular:     Rate and Rhythm: Regular rhythm. Tachycardia present.     Pulses: Normal pulses.     Heart sounds: Normal heart sounds.  Pulmonary:      Effort: Pulmonary effort is normal.     Comments: Diminished breath sound at RUL. Crackles on RLL.  No adventitious sound on left. Abdominal:     General: Abdomen is flat.     Palpations: Abdomen is soft.  Musculoskeletal:     Cervical back: Neck supple.  Skin:    General: Skin is warm and dry.  Neurological:     Mental Status: He is alert and oriented to person, place, and time.  Psychiatric:        Mood and Affect: Mood normal.        Judgment: Judgment normal.    MD Evaluation Airway: WNL Heart: WNL Abdomen: WNL Chest/ Lungs: WNL ASA  Classification: 3 Mallampati/Airway Score: Two  Imaging: DG Chest 2 View  Result Date: 02/25/2021 CLINICAL DATA:  History of right partial lobectomy with hemoptysis and increasing shortness of breath EXAM: CHEST - 2 VIEW COMPARISON:  02/20/2021 FINDINGS: Cardiac shadow is stable. Left lung remains clear. Right lung demonstrates right-sided pleural effusion and masslike density superiorly with postsurgical changes. This represents pleural fluid loculated from prior resection and stable in appearance. Some increasing parenchymal density is noted throughout the aerated lung which may represent hemorrhage given the history of hemoptysis. No new mass lesion is seen. IMPRESSION: Stable masslike density in the right apex consisting of loculated fluid following recent surgery. Increasing interstitial opacity within the right lung which may represent some hemorrhage given the hemoptysis. Small basilar right sided effusion. Electronically Signed   By: Inez Catalina M.D.   On: 02/25/2021 22:27   DG Chest 2 View  Result Date: 02/20/2021 CLINICAL DATA:  Right upper lobe mass EXAM: CHEST - 2 VIEW COMPARISON:  02/13/2021 FINDINGS: Cardiac shadow is stable. Volume loss on the right is noted with small effusions stable from the prior exam. Postoperative changes are noted in the right apex with persistent soft tissue mass lesion stable from the prior exam. Left lung  remains clear. Posterior fracture of the right fifth rib is noted stable from previous exam. IMPRESSION: Stable right upper lobe mass lesion.  Postsurgical changes are seen. Electronically Signed   By: Inez Catalina M.D.   On: 02/20/2021 14:32   DG Chest 2 View  Result Date: 02/13/2021 CLINICAL DATA:  Status post thoracotomy and VATS procedure EXAM: CHEST - 2 VIEW COMPARISON:  Jan 26, 2021 FINDINGS: There is a masslike area in the right upper lobe  extending to the apex with surgical clips in this area. This masslike area measures 8.0 x 8.1 cm. There is a right pleural effusion with volume loss on the right. Left lung is clear. Heart size and pulmonary vascularity normal. No adenopathy no bone lesions. IMPRESSION: 8.1 x 8.0 cm masslike area right upper lobe toward the apex. Suspect postoperative loculated fluid hematoma in this area is most likely etiology given the dramatic change compared to the study from approximately 2 weeks prior. There is a small right pleural effusion there is right base volume loss. Left lung is clear. Heart size normal. Electronically Signed   By: Lowella Grip III M.D.   On: 02/13/2021 14:31   CT CHEST WO CONTRAST  Result Date: 02/16/2021 CLINICAL DATA:  History of right upper lobe lung cancer status post right upper lobe lobectomy. Follow-up postoperative right apical fluid collection. EXAM: CT CHEST WITHOUT CONTRAST TECHNIQUE: Multidetector CT imaging of the chest was performed following the standard protocol without IV contrast. COMPARISON:  PET-CT 12/13/2020 and chest x-ray 02/13/2021 FINDINGS: Cardiovascular: The heart is normal in size peer no pericardial effusion. Stable age advanced coronary artery calcifications. No definite aortic calcifications. Mediastinum/Nodes: Stable scattered sub 8 mm mediastinal lymph nodes. No new or progressive findings. The esophagus is grossly normal. Lungs/Pleura: Surgical changes from a right upper lobe lobectomy. There is a moderate-sized  right pleural effusion with loculated fluid at the right lung apex. Small amount of residual pleural air is also noted. No worrisome pulmonary nodules to suggest pulmonary metastatic disease. Upper Abdomen: No significant upper abdominal findings. No hepatic or adrenal gland lesions are identified. Musculoskeletal: No significant bony findings. IMPRESSION: 1. Surgical changes from a right upper lobe lobectomy. 2. Moderate-sized right pleural effusion with loculated fluid at the right lung apex. 3. No worrisome pulmonary nodules to suggest pulmonary metastatic disease. 4. Stable age advanced coronary artery calcifications. Aortic Atherosclerosis (ICD10-I70.0) Electronically Signed   By: Marijo Sanes M.D.   On: 02/16/2021 16:27   CT Angio Chest PE W and/or Wo Contrast  Result Date: 02/26/2021 CLINICAL DATA:  hx of R partial lobectomy, has been coughing up blood since about noon, increased shortness of breath, denies home 02, now on 3L [place]. Pulmonary embolus suspected. VATS and thoracotomy surgery 01/22/2021 EXAM: CT ANGIOGRAPHY CHEST WITH CONTRAST TECHNIQUE: Multidetector CT imaging of the chest was performed using the standard protocol during bolus administration of intravenous contrast. Multiplanar CT image reconstructions and MIPs were obtained to evaluate the vascular anatomy. CONTRAST:  73mL OMNIPAQUE IOHEXOL 350 MG/ML SOLN COMPARISON:  CT chest 02/16/2021 FINDINGS: Cardiovascular: Satisfactory opacification of the pulmonary arteries to the segmental level. No evidence of pulmonary embolism. Normal heart size. No pericardial effusion. The thoracic aorta is normal in caliber. Coronary artery calcifications are noted. Mediastinum/Nodes: No enlarged mediastinal, hilar, or axillary lymph nodes. Thyroid gland, trachea, and esophagus demonstrate no significant findings. Lungs/Pleura: Surgical changes related to a partial right lobectomy of the right upper lobe with associated loculated right apical moderate-sized  pleural effusion. Persistent trace basilar pleural effusion. Diffuse, right greater than left as well as lower lobe greater than upper lobe, peribronchovascular ground-glass and consolidative opacities. Some peripheral airspace opacities are also noted. Upper Abdomen: No acute abnormality. Musculoskeletal: No chest wall abnormality. No suspicious lytic or blastic osseous lesions. No acute displaced fracture. Redemonstration of subacute right comminuted fifth as well as right nondisplaced sixth rib fractures that are broken in 2 separate places. Multilevel degenerative changes of the spine. Review of the MIP images  confirms the above findings. IMPRESSION: 1. No pulmonary embolus. 2. Diffuse pulmonary findings findings suggestive of alveolar hemorrhage versus infection/inflammation versus pulmonary edema. 3. Similar-appearing loculated moderate sized right apical pleural effusion with limited evaluation due to timing of intravenous contrast. Persistent trace right basilar pleural effusion. 4. Subacute right comminuted fifth as well as right nondisplaced sixth rib fractures that are broken in 2 separate places consistent with history of thoracotomy. 5. Coronary artery calcifications. Electronically Signed   By: Iven Finn M.D.   On: 02/26/2021 04:59    Labs:  CBC: Recent Labs    01/18/21 1500 01/22/21 0609 01/23/21 0136 01/24/21 0142 02/25/21 2204 02/26/21 0630  WBC 12.4*  --  14.1* 9.8 29.4*  --   HGB 13.5   < > 11.5* 11.0* 13.7 11.6*  HCT 41.3   < > 35.2* 34.1* 40.2 34.0*  PLT 450*  --  281 271 473*  --    < > = values in this interval not displayed.    COAGS: Recent Labs    01/18/21 1500 02/25/21 2204  INR 1.0 1.1  APTT 28  --     BMP: Recent Labs    01/18/21 1500 01/22/21 0609 01/23/21 0136 01/24/21 0142 02/25/21 2204 02/26/21 0630  NA 138 135 136 135 138 139  K 2.9* 3.5 3.8 3.5 3.8 3.6  CL 102 100 100 100 99  --   CO2 21*  --  27 28 26   --   GLUCOSE 170* 155* 135*  121* 149*  --   BUN 6 9 10 15 11   --   CALCIUM 8.9  --  8.4* 8.6* 9.3  --   CREATININE 0.52* 0.60* 0.66 0.61 0.71  --   GFRNONAA >60  --  >60 >60 >60  --     LIVER FUNCTION TESTS: Recent Labs    01/09/21 1525 01/18/21 1500 01/24/21 0142 02/25/21 2204  BILITOT <0.1* 0.5 0.4 1.1  AST 20 18 24 21   ALT 24 22 21 28   ALKPHOS 79 90 68 103  PROT 7.1 7.1 5.9* 7.1  ALBUMIN 3.4* 3.3* 2.7* 3.6    TUMOR MARKERS: No results for input(s): AFPTM, CEA, CA199, CHROMGRNA in the last 8760 hours.  Assessment and Plan: 60 y.o. male with recently diagnosed adenocarcinoma of right lung, s/p right upper lobe lobectomy on 01/22/2021.  Patient had a follow-up CT chest on 02/16/2021 which showed loculated right pleural effusion.  Patient was referred to IR for a CT-guided aspiration of the loculated right pleural effusion, the procedure was approved by Dr. Anselm Pancoast and was scheduled for tomorrow at [facility] IR.   Unfortunately, patient presented to [facility] ED due to hemoptysis and shortness of breath, CT angio was negative for PE, however, diffuse infiltrates concerning for alveolar hemorrhage with possible superimposed infectious process versus pulmonary edema.  Cardiothoracic surgery was consulted upon admission, patient was started on IV antibiotics for suspected pneumonia and currently admitted.   IR was requested for an image guided chest tube placement for right loculated pleural effusion.  Case was reviewed and approved by Dr. Earleen Newport. N.p.o. since midnight Vital signs: BP 158/85, PR 99, RR 18, temp 98 F Not on anticoagulant/antiplatelet INR 1.1 on 02/25/2021 Blood cx pending   Risks and benefits of chest tube placement were discussed with the patient and his wife including bleeding, infection, damage to adjacent structures, malfunction of the tube requiring additional procedures and sepsis.  All of the patient and his wife's questions were answered, patient is agreeable to  proceed. Consent signed and in chart.     Thank you for this interesting consult.  I greatly enjoyed meeting Christopher Carter and look forward to participating in their care.  A copy of this report was sent to the requesting provider on this date.  Electronically Signed: Tera Mater, PA-C 02/26/2021, 11:54 AM   I spent a total of 30 minute    in face to face in clinical consultation, greater than 50% of which was counseling/coordinating care for right chest tube placement.

## 2021-02-26 NOTE — Care Plan (Signed)
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 Of note patient was admitted in [facility] from 5/16 until 5/20.  During that hospitalization patient underwent right upper lobe lobectomy and chest wall dissection by Dr. Roxan Hockey.  Patient was eventually discharged without complication on 4/40. He is found to have leukocytosis 29.4, Lactic acid 3.7, CT chest revealed diffuse infiltrates concerning for alveolar hemorrhage with possible superimposed infectious process versus pulmonary edema.  Patient was started on IV antibiotics ( ceftriaxone, Zithromax and vancomycin).  Patient was given aggressive intravenous isotonic fluid resuscitation for lactic acidosis which was resolved.  Patient was seen by pulmonology recommended chest tube placement for hemothorax.  Cardiothoracic surgery consulted awaiting recommendation. Patient was admitted for acute hypoxic respiratory failure requiring 25 L of high flow oxygen.  Continue to monitor and wean this down as tolerated.  Continue bronchodilator therapy, continue antibiotics. Patient was seen and examined at bedside.

## 2021-02-26 NOTE — ED Notes (Signed)
PT 02 sustained 86%-87% at 6L [place]. RT called for [place]. NRB applied. 02 Sat 100%. MD Wickline notified.

## 2021-02-26 NOTE — ED Notes (Signed)
Adjusted pt O2 to 6 L. Pt O2 was 86% on 3 L.

## 2021-02-26 NOTE — Progress Notes (Signed)
Pharmacy Antibiotic Note  Christopher Carter is a 60 y.o. male admitted on 02/25/2021 with sepsis.  Pharmacy has been consulted for Vancomycin dosing. Last month pt had a right thoracotomy/lobectomy. WBC is markedly elevated. Renal function good.   Plan: -Vancomycin 1500 mg IV x 1, then 1250 mg IV q12h >>Estimated AUC: 456 -Ceftriaxone/Azithromycin x 1 given in the ED, f/u additional gram negative coverage -Trend WBC, temp, renal function  -F/U infectious work-up -Drug levels as indicated   Height: 5\' 10"  (177.8 cm) Weight: 83.9 kg (185 lb) IBW/kg (Calculated) : 73  Temp (24hrs), Avg:98.6 F (37 C), Min:98.6 F (37 C), Max:98.6 F (37 C)  Recent Labs  Lab 02/25/21 2204 02/26/21 0241  WBC 29.4*  --   CREATININE 0.71  --   LATICACIDVEN  --  1.6    Estimated Creatinine Clearance: 103.9 mL/min (by C-G formula based on SCr of 0.71 mg/dL).    Allergies  Allergen Reactions   Oxycodone Hcl Other (See Comments)    Pt reported "seeing things" and " feeling unusual."   Bupropion Nausea And Vomiting    Narda Bonds, PharmD, BCPS Clinical Pharmacist Phone: [phone]

## 2021-02-26 NOTE — Progress Notes (Signed)
Notified provider of need to order repeat lactic acid. ° °

## 2021-02-26 NOTE — ED Notes (Signed)
Patient transported to CT 

## 2021-02-26 NOTE — Progress Notes (Signed)
Sepsis tracking by eLINK 

## 2021-02-26 NOTE — ED Provider Notes (Signed)
[facility] EMERGENCY DEPARTMENT Provider Note   CSN: [id number] Arrival date & time: 02/25/21  2029     History Chief Complaint  Patient presents with   Hemoptysis   Level 5 caveat due to acuity of condition Christopher Carter is a 60 y.o. male.  The history is provided by the patient and a significant other.  Shortness of Breath Severity:  Severe Onset quality:  Gradual Duration:  12 hours Timing:  Intermittent Progression:  Worsening Chronicity:  New Relieved by:  Nothing Worsened by:  Nothing Associated symptoms: chest pain, cough and hemoptysis   Patient with history of lung mass with recent lobectomy presenting with cough and shortness of breath.  Patient reports approximately 12 hours ago he began having cough and hemoptysis.  He also reports chest pain with the cough. He is not on home oxygen.    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   Hemoptysis 12/22/2020   Small bowel obstruction ([place]) 04/23/2017    Past Surgical History:  Procedure Laterality Date   ANKLE FRACTURE SURGERY Right    BRONCHIAL BIOPSY  01/05/2021   Procedure: BRONCHIAL BIOPSIES;  Surgeon: Collene Gobble, MD;  Location: [place];  Service: Cardiopulmonary;;   BRONCHIAL BRUSHINGS  01/05/2021   Procedure: BRONCHIAL BRUSHINGS;  Surgeon: Collene Gobble, MD;  Location: [place];  Service: Cardiopulmonary;;   BRONCHIAL NEEDLE ASPIRATION BIOPSY  01/05/2021   Procedure: BRONCHIAL NEEDLE ASPIRATION BIOPSIES;  Surgeon: Collene Gobble, MD;  Location: [place];  Service: Cardiopulmonary;;   COLON SURGERY  2017   bowel obstruction surgery per pt    ENDOBRONCHIAL ULTRASOUND N/A 01/05/2021   Procedure: ENDOBRONCHIAL  ULTRASOUND;  Surgeon: Collene Gobble, MD;  Location: [facility] ENDOSCOPY;  Service: Cardiopulmonary;  Laterality: N/A;   INTERCOSTAL NERVE BLOCK Right 01/22/2021   Procedure: INTERCOSTAL NERVE BLOCK;  Surgeon: [name], MD;  Location: [place];  Service: Thoracic;  Laterality: Right;   LAPAROSCOPY N/A 04/25/2017   Procedure: LAPAROSCOPY ASSISTED ILEO-CECECTOMY WITH SMALL BOWEL RESECTION WITH ANASTOMOSIS;  Surgeon: Excell Seltzer, MD;  Location: [facility];  Service: General;  Laterality: N/A;   LYMPH NODE DISSECTION Right 01/22/2021   Procedure: LYMPH NODE DISSECTION;  Surgeon: [name], MD;  Location: [place];  Service: Thoracic;  Laterality: Right;   THORACOTOMY Right 01/22/2021   Procedure: THORACOTOMY;  Surgeon: [name], MD;  Location: [place];  Service: Thoracic;  Laterality: Right;   VIDEO ASSISTED THORACOSCOPY (VATS)/ LOBECTOMY Right 01/22/2021   Procedure: VIDEO ASSISTED THORACOSCOPY (VATS)/RIGHT UPPER LOBECTOMY;  Surgeon: [name], MD;  Location: [place];  Service: Thoracic;  Laterality: Right;   VIDEO BRONCHOSCOPY N/A 01/05/2021   Procedure: VIDEO BRONCHOSCOPY WITH FLUORO;  Surgeon: Collene Gobble, MD;  Location: [place];  Service: Cardiopulmonary;  Laterality: N/A;       History reviewed. No pertinent family history.  Social History   Tobacco Use   Smoking status: Never   Smokeless tobacco: Never  Vaping Use   Vaping Use: Never used  Substance Use Topics   Alcohol use: Not Currently    Alcohol/week: 21.0 standard drinks    Types: 21 Shots of  liquor per week    Comment: 3-4 shots of whiskey a day   Drug use: No    Home Medications Prior to Admission medications   Medication Sig Start Date End Date Taking? Authorizing Provider  acetaminophen (TYLENOL) 500 MG tablet Take 500 mg by mouth 2 (two) times daily as needed for moderate pain or headache.    [provider]  amLODipine (NORVASC) 10 MG tablet TAKE 1 TABLET BY MOUTH  EVERY DAY 02/19/21   [name], MD  Ascorbic Acid (VITAMIN C) 1000 MG tablet Take 1,000 mg by mouth daily.    [provider]  aspirin EC 81 MG tablet Take 81 mg by mouth daily. Swallow whole.    [provider]  Coenzyme Q10 (COQ10) 100 MG CAPS Take 100 mg by mouth daily.    [provider]  gabapentin (NEURONTIN) 300 MG capsule Take 1 capsule (300 mg total) by mouth 3 (three) times daily. 02/13/21   [name], MD  HAWTHORNE BERRY PO Take 1 tablet by mouth daily.    [provider]  irbesartan (AVAPRO) 300 MG tablet Take 300 mg by mouth daily.    [provider]  latanoprost (XALATAN) 0.005 % ophthalmic solution Place 1 drop into both eyes at bedtime.    [provider]  Menthol, Topical Analgesic, (BIOFREEZE EX) Apply 1 application topically daily as needed (pain).    [provider]  OVER THE COUNTER MEDICATION Take 1 tablet by mouth 3 (three) times daily. Jointprin otc supplement    [provider]  QUEtiapine (SEROQUEL) 50 MG tablet Take 50 mg by mouth at bedtime. 11/06/20   [provider]  Red Yeast Rice Extract (RED YEAST RICE PO) Take 2 tablets by mouth daily.    [provider]  sertraline (ZOLOFT) 100 MG tablet Take 100 mg by mouth daily. 10/29/20   [provider]  traMADol (ULTRAM) 50 MG tablet Take 2 tablets (100 mg total) by mouth every 6 (six) hours as needed for up to 7 days (mild pain). 02/24/21 03/03/21  [name], MD  TURMERIC PO Take 1 tablet by mouth daily.    [provider]  vitamin B-12 (CYANOCOBALAMIN) 1000 MCG tablet Take 1,000 mcg by mouth daily.    [provider]    Allergies    Oxycodone hcl and Bupropion  Review of Systems   Review of Systems  Unable to perform ROS: Acuity of condition  Respiratory:  Positive for cough, hemoptysis and shortness of breath.   Cardiovascular:  Positive for chest pain.   Physical  Exam Updated Vital Signs BP (!) 158/97   Pulse (!) 106   Temp 98.6 F (37 C) (Oral)   Resp 19   SpO2 93%   Physical Exam CONSTITUTIONAL: Ill-appearing, diaphoretic HEAD: Normocephalic/atraumatic EYES: EOMI/PERRL ENMT: Mucous membranes moist NECK: supple no meningeal signs SPINE/BACK:entire spine nontender CV: Tachycardic, no loud murmur LUNGS: Crackles bilaterally, right> left ABDOMEN: soft, nontender, no rebound or guarding, bowel sounds noted throughout abdomen GU:no cva tenderness NEURO: Pt is awake/alert/appropriate, moves all extremitiesx4.  No facial droop.   EXTREMITIES: pulses normal/equal, full ROM, no lower extremity edema SKIN: Diaphoretic PSYCH: Anxious  ED Results / Procedures / Treatments   Labs (all labs ordered are listed, but only abnormal results are displayed) Labs Reviewed  COMPREHENSIVE METABOLIC PANEL - Abnormal; Notable for the following components:      Result Value   Glucose, Bld 149 (*)    All other  components within normal limits  CBC WITH DIFFERENTIAL/PLATELET - Abnormal; Notable for the following components:   WBC 29.4 (*)    Platelets 473 (*)    Neutro Abs 26.0 (*)    Monocytes Absolute 1.5 (*)    Abs Immature Granulocytes 0.21 (*)    All other components within normal limits  RESP PANEL BY RT-PCR (FLU A&B, COVID) ARPGX2  CULTURE, BLOOD (ROUTINE X 2)  CULTURE, BLOOD (ROUTINE X 2)  LIPASE, BLOOD  PROTIME-INR  LACTIC ACID, PLASMA  LACTIC ACID, PLASMA  BLOOD GAS, ARTERIAL    EKG EKG Interpretation  Date/Time:  Sunday February 25 2021 21:54:01 EDT Ventricular Rate:  109 PR Interval:  160 QRS Duration: 88 QT Interval:  334 QTC Calculation: 449 R Axis:   16 Text Interpretation: Sinus tachycardia Anterior infarct , age undetermined Abnormal ECG Confirmed by Ripley Fraise [phone]) on 02/26/2021 2:05:25 AM  Radiology DG Chest 2 View  Result Date: 02/25/2021 CLINICAL DATA:  History of right partial lobectomy with hemoptysis and  increasing shortness of breath EXAM: CHEST - 2 VIEW COMPARISON:  02/20/2021 FINDINGS: Cardiac shadow is stable. Left lung remains clear. Right lung demonstrates right-sided pleural effusion and masslike density superiorly with postsurgical changes. This represents pleural fluid loculated from prior resection and stable in appearance. Some increasing parenchymal density is noted throughout the aerated lung which may represent hemorrhage given the history of hemoptysis. No new mass lesion is seen. IMPRESSION: Stable masslike density in the right apex consisting of loculated fluid following recent surgery. Increasing interstitial opacity within the right lung which may represent some hemorrhage given the hemoptysis. Small basilar right sided effusion. Electronically Signed   By: Inez Catalina M.D.   On: 02/25/2021 22:27   CT Angio Chest PE W and/or Wo Contrast  Result Date: 02/26/2021 CLINICAL DATA:  hx of R partial lobectomy, has been coughing up blood since about noon, increased shortness of breath, denies home 02, now on 3L [place]. Pulmonary embolus suspected. VATS and thoracotomy surgery 01/22/2021 EXAM: CT ANGIOGRAPHY CHEST WITH CONTRAST TECHNIQUE: Multidetector CT imaging of the chest was performed using the standard protocol during bolus administration of intravenous contrast. Multiplanar CT image reconstructions and MIPs were obtained to evaluate the vascular anatomy. CONTRAST:  15mL OMNIPAQUE IOHEXOL 350 MG/ML SOLN COMPARISON:  CT chest 02/16/2021 FINDINGS: Cardiovascular: Satisfactory opacification of the pulmonary arteries to the segmental level. No evidence of pulmonary embolism. Normal heart size. No pericardial effusion. The thoracic aorta is normal in caliber. Coronary artery calcifications are noted. Mediastinum/Nodes: No enlarged mediastinal, hilar, or axillary lymph nodes. Thyroid gland, trachea, and esophagus demonstrate no significant findings. Lungs/Pleura: Surgical changes related to a partial  right lobectomy of the right upper lobe with associated loculated right apical moderate-sized pleural effusion. Persistent trace basilar pleural effusion. Diffuse, right greater than left as well as lower lobe greater than upper lobe, peribronchovascular ground-glass and consolidative opacities. Some peripheral airspace opacities are also noted. Upper Abdomen: No acute abnormality. Musculoskeletal: No chest wall abnormality. No suspicious lytic or blastic osseous lesions. No acute displaced fracture. Redemonstration of subacute right comminuted fifth as well as right nondisplaced sixth rib fractures that are broken in 2 separate places. Multilevel degenerative changes of the spine. Review of the MIP images confirms the above findings. IMPRESSION: 1. No pulmonary embolus. 2. Diffuse pulmonary findings findings suggestive of alveolar hemorrhage versus infection/inflammation versus pulmonary edema. 3. Similar-appearing loculated moderate sized right apical pleural effusion with limited evaluation due to timing of intravenous contrast. Persistent trace right basilar  pleural effusion. 4. Subacute right comminuted fifth as well as right nondisplaced sixth rib fractures that are broken in 2 separate places consistent with history of thoracotomy. 5. Coronary artery calcifications. Electronically Signed   By: Iven Finn M.D.   On: 02/26/2021 04:59    Procedures .Critical Care  Date/Time: 02/26/2021 2:07 AM Performed by: Ripley Fraise, MD Authorized by: Ripley Fraise, MD   Critical care provider statement:    Critical care time (minutes):  60   Critical care start time:  02/26/2021 2:07 AM   Critical care end time:  02/26/2021 3:07 AM   Critical care time was exclusive of:  Separately billable procedures and treating other patients   Critical care was necessary to treat or prevent imminent or life-threatening deterioration of the following conditions:  Respiratory failure and sepsis   Critical care was  time spent personally by me on the following activities:  Development of treatment plan with patient or surrogate, discussions with consultants, evaluation of patient's response to treatment, examination of patient, review of old charts, re-evaluation of patient's condition, pulse oximetry, ordering and review of laboratory studies, ordering and review of radiographic studies, ordering and performing treatments and interventions and obtaining history from patient or surrogate   I assumed direction of critical care for this patient from another provider in my specialty: no     Care discussed with: admitting provider     Medications Ordered in ED Medications  lactated ringers infusion (has no administration in time range)  vancomycin (VANCOREADY) IVPB 1500 mg/300 mL (1,500 mg Intravenous New Bag/Given 02/26/21 0349)  vancomycin (VANCOREADY) IVPB 1250 mg/250 mL (has no administration in time range)  fentaNYL (SUBLIMAZE) injection 100 mcg (100 mcg Intravenous Given 02/26/21 0203)  ondansetron (ZOFRAN) injection 4 mg (4 mg Intravenous Given 02/26/21 0202)  lactated ringers bolus 1,000 mL (1,000 mLs Intravenous New Bag/Given 02/26/21 0443)    And  lactated ringers bolus 1,000 mL (1,000 mLs Intravenous New Bag/Given 02/26/21 0303)    And  lactated ringers bolus 1,000 mL (0 mLs Intravenous Stopped 02/26/21 0444)  cefTRIAXone (ROCEPHIN) 2 g in sodium chloride 0.9 % 100 mL IVPB (0 g Intravenous Stopped 02/26/21 0347)  azithromycin (ZITHROMAX) 500 mg in sodium chloride 0.9 % 250 mL IVPB (0 mg Intravenous Stopped 02/26/21 0444)  ipratropium-albuterol (DUONEB) 0.5-2.5 (3) MG/3ML nebulizer solution 3 mL (3 mLs Nebulization Given 02/26/21 0255)  iohexol (OMNIPAQUE) 350 MG/ML injection 50 mL (50 mLs Intravenous Contrast Given 02/26/21 0419)  ipratropium-albuterol (DUONEB) 0.5-2.5 (3) MG/3ML nebulizer solution 3 mL (3 mLs Nebulization Given 02/26/21 0446)    ED Course  I have reviewed the triage vital signs and the  nursing notes.  Pertinent labs & imaging results that were available during my care of the patient were reviewed by me and considered in my medical decision making (see chart for details).    MDM Rules/Calculators/A&P                          2:08 AM Patient presents with increasing shortness of breath and hemoptysis. Patient has significant history of lung mass with recent lobectomy. He is now having hemoptysis, but is not on anticoagulation Patient is tachypneic and does have an oxygen requirement. Discussed the case with Dr. Roxy Manns  with cardiothoracic surgery. We reviewed imaging and labs.  He request CT angio chest to rule out pulmonary embolism.  Start broad-spectrum antibiotics for presumed infectious etiology. Patient can be admitted to stepdown unit/2-C with the  hospitalist Will monitor closely to ensure the patient has no deterioration of mental status or respiratory status 3:49 AM Overall patient appears improved.  His work of breathing is improved.  We are waiting for CT chest, then will be admitted 5:13 AM CT and findings are negative for acute PE, but likely has pulmonary hemorrhage. Patient had some shortness of breath after CT imaging, but is now feeling improved.  He is awake and alert and protecting his airway.  He responds well to DuoNeb  Discussed with Dr. Cyd Silence for admission.  He will be admitted to 2-C unit Final Clinical Impression(s) / ED Diagnoses Final diagnoses:  Hemoptysis  Acute respiratory failure with hypoxia [facility])    Rx / DC Orders ED Discharge Orders     None        Ripley Fraise, MD 02/26/21 [phone]

## 2021-02-26 NOTE — ED Notes (Signed)
Provider at the bedside.  

## 2021-02-26 NOTE — Progress Notes (Signed)
Placed patient on heated high flow cannula at 100% and 25LPM. Sp02=96% at this time B/S course.

## 2021-02-26 NOTE — ED Notes (Addendum)
Pt resting.

## 2021-02-26 NOTE — ED Notes (Signed)
Pt is on 3 L O2 sat at 92%

## 2021-02-26 NOTE — Consult Note (Signed)
NAME:  Christopher Carter, MRN:  [id number], DOB:  12/01/1960, LOS: 0 ADMISSION DATE:  02/25/2021, CONSULTATION DATE:  6/20 REFERRING MD:  Dwyane Dee, CHIEF COMPLAINT:  respiratory failure and hemoptysis    History of Present Illness:  This is a 60 year old non-smoker.  Recently status post right upper lobe lobectomy with node dissection 5/16 for T4, N0, stage IIIa adenocarcinoma.  Surgical notes stated there was still residual tumor posterior near spine which was unable to be resected.  He is yet to undergo chemo or radiation.  Had been recovering slowly with expected postoperative pain.  Started to note decreased energy, more fatigue, overall malaise sometime around 6/15.  Had been seen the day prior by cardiothoracic's who noted loculated right upper lobe collection from surgical site for which plan CT-guided aspiration was planned for 6/21.  On 6/19 he continued to feel weak and fatigued, around midday he began to cough up blood.  Noting about a tablespoon at a time, reporting it is being bright red, and at its worst up to 6 times an hour.  At sometimes the cough would be so intense that it would make him nauseated and he would also vomit because of this, and worsening shortness of breath he reported to the emergency room around 8 PM on 6/19 .  In the ER he was found to be hypoxic requiring titration of oxygen up to 100% on heated high flow.  Initial CT imaging on 6/20 was negative for pulmonary embolus, there is a loculated moderate size right apical pleural fluid collection, diffuse bilateral airspace disease, and subacute right fifth and nondisplaced sixth rib fractures secondary to prior "thoracotomy.  He was admitted by the internal medicine service, cardiothoracic surgery is already consulted, because of the supplemental oxygen need pulmonary asked to evaluate  Pertinent  Medical History  Hypertension, depression.  Right upper lobe lung mass status post thoracotomy and right upper lobectomy with node  dissection 5/16.  Mass noted to be invading the chest wall with positive margins posteriorly near the spine.  He is T4, N0, stage IIIa adenocarcinoma.  Surgical notes document residual tumor posteriorly near the spine which was unable to be resected.  He has yet to undergo adjuvant radiation or chemo Last seen in cardiothoracic clinic on 6/17 at which time imaging noted to see a new loculated right upper lobe fluid collection and there was plan to proceed with CT-guided aspiration Significant Hospital Events: Including procedures, antibiotic start and stop dates in addition to other pertinent events   6/19 admitted with new hemoptysis,, fatigue, and worsening shortness of breath.  Found to be hypoxic on arrival requiring titration of supplemental oxygen up to heated high flow.  Started on azithromycin, ceftriaxone, and vancomycin  6/20   CT imaging obtained showing previously identified loculated right apical pleural effusion, negative for pulmonary emboli.  There is diffuse alveolar airspace disease.  Pulmonary asked to evaluate.  Interventional radiology consulted  Interim History / Subjective:  Feels a little better now, shortness of breath is better with supplemental oxygen, however does feel worsening chest congestion once again he is nervous this will make him nauseated.  Objective   Blood pressure (Abnormal) 145/90, pulse (Abnormal) 107, temperature 98.4 F (36.9 C), temperature source Oral, resp. rate (Abnormal) 26, height 5\' 10"  (1.778 m), weight 83.9 kg, SpO2 99 %.    FiO2 (%):  [100 %] 100 %   Intake/Output Summary (Last 24 hours) at 02/26/2021 0908 Last data filed at 02/26/2021 0735 Gross per 24  hour  Intake 3926.45 ml  Output 700 ml  Net 3226.45 ml   Filed Weights   02/26/21 0208  Weight: 83.9 kg    Examination: General: 60 year old white male sitting up in bed he is currently in no acute distress but is slightly anxious HENT: Normocephalic atraumatic no jugular venous  distention appreciated mucous membranes moist sclera not icteric Lungs: Diffuse rhonchi with tactile fremitus no accessory use at rest currently on high flow supplemental oxygen 100% 25 L flow Cardiovascular: Regular rate and rhythm without murmur rub or gallop Abdomen: Soft not tender no organomegaly Extremities: Warm dry with brisk capillary refill Neuro: Awake oriented no focal deficits GU: Voids spontaneously  Labs/imaging that I havepersonally reviewed  (right click and "Reselect all SmartList Selections" daily)  Reviewed  Resolved Hospital Problem list     Assessment & Plan:  Acute hypoxic respiratory failure (POA) in the setting of diffuse pulmonary infiltrates with associated hemoptysis, and right upper lobe loculated pleural effusion -He is status post recent right upper lobe lobectomy.  Concerned about postoperative infection/infected pleural space with associated pneumonia -His degree of hypoxia is a contraindication to bronchoscopy  unless requires intubation Plan Agree with broad-spectrum antibiotics, currently on ceftriaxone, vancomycin, and azithromycin.  We will change ceftriaxone to cefepime for better nosocomial coverage Continue high flow supplemental oxygen Cough suppression given hemoptysis IR has been consulted for CT guided drainage of loculated fluid on the right chest A.m. chest x-ray If requires intubation would be reasonable to proceed with airway inspection Serial CBCs with hemoptysis He is on Neurontin for postoperative pain this is been continued  Severe sepsis secondary to above Plan IV hydration Continue antibiotics  Stage IIIa adenocarcinoma of the lung status post right upper lobe lobectomy, yet to undergo adjunct of therapy Plan Follow-up at his outpatient  History of hypertension Plan Continuing his home Norvasc and Avapro  History of anxiety and depression Plan Continuing his Seroquel, and Zoloft.    Best Practice (right click and  "Reselect all SmartList Selections" daily)   Diet/type: NPO w/ oral meds Pain/Anxiety/Delirium protocol Not indicated VAP protocol (if indicated): Not indicated DVT prophylaxis: SCD GI prophylaxis: N/A Glucose control:  not indicated Central venous access:  N/A Arterial line:  N/A Foley:  N/A Mobility:  OOB  PT consulted: N/A Studies pending: None Culture data pending:blood Last reviewed culture data:today Antibiotics:cefepime, vanc, and otherazithro Antibiotic de-escalation: no,  continue current rx Stop date: to be determined  Daily labs: requested Code Status:  full code Last date of multidisciplinary goals of care discussion [per primary] ccm prognosis: Serious Disposition: admit to progressive care         Labs   CBC: Recent Labs  Lab 02/25/21 2204 02/26/21 0630  WBC 29.4*  --   NEUTROABS 26.0*  --   HGB 13.7 11.6*  HCT 40.2 34.0*  MCV 90.1  --   PLT 473*  --     Basic Metabolic Panel: Recent Labs  Lab 02/25/21 2204 02/26/21 0630  NA 138 139  K 3.8 3.6  CL 99  --   CO2 26  --   GLUCOSE 149*  --   BUN 11  --   CREATININE 0.71  --   CALCIUM 9.3  --    GFR: Estimated Creatinine Clearance: 103.9 mL/min (by C-G formula based on SCr of 0.71 mg/dL). Recent Labs  Lab 02/25/21 2204 02/26/21 0241 02/26/21 0403  WBC 29.4*  --   --   LATICACIDVEN  --  1.6 3.7*  Liver Function Tests: Recent Labs  Lab 02/25/21 2204  AST 21  ALT 28  ALKPHOS 103  BILITOT 1.1  PROT 7.1  ALBUMIN 3.6   Recent Labs  Lab 02/25/21 2204  LIPASE 18   No results for input(s): AMMONIA in the last 168 hours.  ABG    Component Value Date/Time   PHART 7.458 (H) 02/26/2021 0630   PCO2ART 40.5 02/26/2021 0630   PO2ART 79 (L) 02/26/2021 0630   HCO3 28.7 (H) 02/26/2021 0630   TCO2 30 02/26/2021 0630   ACIDBASEDEF 1.3 01/18/2021 1504   O2SAT 96.0 02/26/2021 0630     Coagulation Profile: Recent Labs  Lab 02/25/21 2204  INR 1.1    Cardiac Enzymes: No  results for input(s): CKTOTAL, CKMB, CKMBINDEX, TROPONINI in the last 168 hours.  HbA1C: No results found for: HGBA1C  CBG: No results for input(s): GLUCAP in the last 168 hours.  Review of Systems:   Review of Systems  Constitutional:  Positive for diaphoresis and malaise/fatigue. Negative for fever.  HENT:  Positive for congestion.   Eyes: Negative.   Respiratory:  Positive for cough, hemoptysis, sputum production and shortness of breath.   Cardiovascular:  Positive for chest pain and leg swelling.  Gastrointestinal:  Positive for nausea and vomiting.  Genitourinary: Negative.   Musculoskeletal: Negative.   Skin: Negative.     Past Medical History:  He,  has a past medical history of Anxiety, Cancer ([place]) (12/2020), Depression, Hypertension, and Pneumonia.   Surgical History:   Past Surgical History:  Procedure Laterality Date   ANKLE FRACTURE SURGERY Right    BRONCHIAL BIOPSY  01/05/2021   Procedure: BRONCHIAL BIOPSIES;  Surgeon: Collene Gobble, MD;  Location: [facility];  Service: Cardiopulmonary;;   BRONCHIAL BRUSHINGS  01/05/2021   Procedure: BRONCHIAL BRUSHINGS;  Surgeon: Collene Gobble, MD;  Location: [facility];  Service: Cardiopulmonary;;   BRONCHIAL NEEDLE ASPIRATION BIOPSY  01/05/2021   Procedure: BRONCHIAL NEEDLE ASPIRATION BIOPSIES;  Surgeon: Collene Gobble, MD;  Location: [facility];  Service: Cardiopulmonary;;   COLON SURGERY  2017   bowel obstruction surgery per pt    ENDOBRONCHIAL ULTRASOUND N/A 01/05/2021   Procedure: ENDOBRONCHIAL ULTRASOUND;  Surgeon: Collene Gobble, MD;  Location: [facility];  Service: Cardiopulmonary;  Laterality: N/A;   INTERCOSTAL NERVE BLOCK Right 01/22/2021   Procedure: INTERCOSTAL NERVE BLOCK;  Surgeon: [name], MD;  Location: [facility];  Service: Thoracic;  Laterality: Right;   LAPAROSCOPY N/A 04/25/2017   Procedure: LAPAROSCOPY ASSISTED ILEO-CECECTOMY WITH SMALL BOWEL RESECTION WITH ANASTOMOSIS;  Surgeon:  Excell Seltzer, MD;  Location: [facility];  Service: General;  Laterality: N/A;   LYMPH NODE DISSECTION Right 01/22/2021   Procedure: LYMPH NODE DISSECTION;  Surgeon: [name], MD;  Location: [facility];  Service: Thoracic;  Laterality: Right;   THORACOTOMY Right 01/22/2021   Procedure: THORACOTOMY;  Surgeon: [name], MD;  Location: [facility];  Service: Thoracic;  Laterality: Right;   VIDEO ASSISTED THORACOSCOPY (VATS)/ LOBECTOMY Right 01/22/2021   Procedure: VIDEO ASSISTED THORACOSCOPY (VATS)/RIGHT UPPER LOBECTOMY;  Surgeon: [name], MD;  Location: [facility];  Service: Thoracic;  Laterality: Right;   VIDEO BRONCHOSCOPY N/A 01/05/2021   Procedure: VIDEO BRONCHOSCOPY WITH FLUORO;  Surgeon: Collene Gobble, MD;  Location: [facility];  Service: Cardiopulmonary;  Laterality: N/A;     Social History:   reports that he has never smoked. He has never used smokeless tobacco. He reports previous alcohol use of about 21.0 standard drinks  of alcohol per week. He reports that he does not use drugs.   Family History:  His family history is not on file.   Allergies Allergies  Allergen Reactions   Oxycodone Hcl Other (See Comments)    Pt reported "seeing things" and " feeling unusual."   Bupropion Nausea And Vomiting     Home Medications  Prior to Admission medications   Medication Sig Start Date End Date Taking? Authorizing Provider  acetaminophen (TYLENOL) 500 MG tablet Take 500 mg by mouth 2 (two) times daily as needed for moderate pain or headache.    [provider]  amLODipine (NORVASC) 10 MG tablet TAKE 1 TABLET BY MOUTH EVERY DAY 02/19/21   [name], MD  Ascorbic Acid (VITAMIN C) 1000 MG tablet Take 1,000 mg by mouth daily.    [provider]  aspirin EC 81 MG tablet Take 81 mg by mouth daily. Swallow whole.    [provider]  Coenzyme Q10 (COQ10) 100 MG CAPS Take 100 mg by mouth daily.    [provider]  gabapentin  (NEURONTIN) 300 MG capsule Take 1 capsule (300 mg total) by mouth 3 (three) times daily. 02/13/21   [name], MD  HAWTHORNE BERRY PO Take 1 tablet by mouth daily.    [provider]  irbesartan (AVAPRO) 300 MG tablet Take 300 mg by mouth daily.    [provider]  latanoprost (XALATAN) 0.005 % ophthalmic solution Place 1 drop into both eyes at bedtime.    [provider]  Menthol, Topical Analgesic, (BIOFREEZE EX) Apply 1 application topically daily as needed (pain).    [provider]  OVER THE COUNTER MEDICATION Take 1 tablet by mouth 3 (three) times daily. Jointprin otc supplement    [provider]  QUEtiapine (SEROQUEL) 50 MG tablet Take 50 mg by mouth at bedtime. 11/06/20   [provider]  Red Yeast Rice Extract (RED YEAST RICE PO) Take 2 tablets by mouth daily.    [provider]  sertraline (ZOLOFT) 100 MG tablet Take 100 mg by mouth daily. 10/29/20   [provider]  traMADol (ULTRAM) 50 MG tablet Take 2 tablets (100 mg total) by mouth every 6 (six) hours as needed for up to 7 days (mild pain). 02/24/21 03/03/21  [name], MD  TURMERIC PO Take 1 tablet by mouth daily.    [provider]  vitamin B-12 (CYANOCOBALAMIN) 1000 MCG tablet Take 1,000 mcg by mouth daily.    [provider]     Critical care time: NA      Erick Colace ACNP-BC [place] Pager # [phone] OR # [phone] if no answer

## 2021-02-26 NOTE — Plan of Care (Signed)

## 2021-02-26 NOTE — Progress Notes (Signed)
RT paged due to pt in radiology with SATs in the low 80s. Pt came from 2c where he was wearing 25Lpm/100% FIO2 via [place] and for transport was placed on a low flow O2 [place] placed at 15Lpm. SATs corrected by placing pt on a NRB, WOB within normal limits. Hypoxia corrected, no other acute distress noted at this time.

## 2021-02-26 NOTE — ED Notes (Addendum)
Pt vomited about 200 ml of dark brown liquid. Notified provider.

## 2021-02-26 NOTE — ED Notes (Signed)
Pt stated pain on his back near his incision and nauseous. Will notify the provider.

## 2021-02-26 NOTE — Procedures (Signed)
Interventional Radiology Procedure Note  Procedure: Placement of a right apical chest tube, into what appears to be remote blood products, loculated hemothorax.   Complications: None Sample: culture to lab  Recommendations:  - to suction - Do not submerge - Routine chest tube care - follow up culture   Signed,  Dulcy Fanny. Earleen Newport, DO

## 2021-02-26 NOTE — H&P (Signed)
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 Julien Nordmann, ) and major depressive disorder who presents to [facility] emergency department with a 1 day history of severe cough with bright red hemoptysis and shortness of breath.  Of note, [facility] from 5/16 until 5/20.  During that hospitalization patient underwent a right upper lobe lobectomy and chest wall dissection with Dr. Roxan Hockey.  Patient was eventually discharged without complication on 8/11.  Patient explains that for the past few days prior to his presentation he has been experiencing increasing fatigue and malaise.  Then, the morning of 6/19 patient began to experience a cough.  Cough was initially dry but by midday cough became productive with bright red blood.  Throughout the day patient's cough progressively worsened, continually productive with bright red blood.  As cough worsened patient began to develop associated shortness of breath, initially mild intensity but progressively becoming more more severe.  Shortness of breath is worse with ongoing cough or any exertion.  Patient is also been experiencing associated intense nausea and bouts of vomiting with hematemesis in varying amounts.  Patient has vomited 4 times since onset of symptoms.  Due to patient's progressively worsening symptoms patient was eventually brought into [facility] emergency department for evaluation.  Upon evaluation in the emergency department, patient was found to have a substantial leukocytosis of 29.4.  COVID-19 testing was found to be negative.  CT angiogram of the chest revealed diffuse infiltrates  concerning for alveolar hemorrhage with possible superimposed infectious process versus pulmonary edema.  Patient was administered intravenous ceftriaxone azithromycin and vancomycin as well as aggressive intravenous isotonic fluid resuscitation.  ER provider then discussed case with Dr. Ricard Dillon with CT surgery who agreed with initiation of intravenous antibiotics for suspected pneumonia and recommended admitting patient to these to see progressive unit for close monitoring and eventual formal consultation by CT surgery later this morning.  The hospitalist group was then called to assess the patient for admission to the hospital.  Review of Systems:   Review of Systems  Respiratory:  Positive for hemoptysis, sputum production and shortness of breath.   Cardiovascular:  Positive for chest pain.  All other systems reviewed and are negative.  Past Medical History:  Diagnosis Date   Anxiety    Cancer ([place]) 12/2020   Depression    Hypertension    Pneumonia    1990's    Past Surgical History:  Procedure Laterality Date   ANKLE FRACTURE SURGERY Right    BRONCHIAL BIOPSY  01/05/2021   Procedure: BRONCHIAL BIOPSIES;  Surgeon: Collene Gobble, MD;  Location: [place];  Service: Cardiopulmonary;;   BRONCHIAL BRUSHINGS  01/05/2021   Procedure: BRONCHIAL BRUSHINGS;  Surgeon: Collene Gobble, MD;  Location: [place];  Service: Cardiopulmonary;;   BRONCHIAL NEEDLE ASPIRATION BIOPSY  01/05/2021   Procedure: BRONCHIAL NEEDLE ASPIRATION BIOPSIES;  Surgeon: Collene Gobble, MD;  Location: [place];  Service: Cardiopulmonary;;   COLON SURGERY  2017   bowel obstruction surgery per pt    ENDOBRONCHIAL ULTRASOUND N/A 01/05/2021   Procedure: ENDOBRONCHIAL ULTRASOUND;  Surgeon: Collene Gobble, MD;  Location: [place];  Service:  Cardiopulmonary;  Laterality: N/A;   INTERCOSTAL NERVE BLOCK Right 01/22/2021   Procedure: INTERCOSTAL NERVE BLOCK;  Surgeon: [name], MD;  Location: [place];   Service: Thoracic;  Laterality: Right;   LAPAROSCOPY N/A 04/25/2017   Procedure: LAPAROSCOPY ASSISTED ILEO-CECECTOMY WITH SMALL BOWEL RESECTION WITH ANASTOMOSIS;  Surgeon: Excell Seltzer, MD;  Location: [facility];  Service: General;  Laterality: N/A;   LYMPH NODE DISSECTION Right 01/22/2021   Procedure: LYMPH NODE DISSECTION;  Surgeon: [name], MD;  Location: [place];  Service: Thoracic;  Laterality: Right;   THORACOTOMY Right 01/22/2021   Procedure: THORACOTOMY;  Surgeon: [name], MD;  Location: [place];  Service: Thoracic;  Laterality: Right;   VIDEO ASSISTED THORACOSCOPY (VATS)/ LOBECTOMY Right 01/22/2021   Procedure: VIDEO ASSISTED THORACOSCOPY (VATS)/RIGHT UPPER LOBECTOMY;  Surgeon: [name], MD;  Location: [place];  Service: Thoracic;  Laterality: Right;   VIDEO BRONCHOSCOPY N/A 01/05/2021   Procedure: VIDEO BRONCHOSCOPY WITH FLUORO;  Surgeon: Collene Gobble, MD;  Location: [place];  Service: Cardiopulmonary;  Laterality: N/A;     reports that he has never smoked. He has never used smokeless tobacco. He reports previous alcohol use of about 21.0 standard drinks of alcohol per week. He reports that he does not use drugs.  Allergies  Allergen Reactions   Oxycodone Hcl Other (See Comments)    Pt reported "seeing things" and " feeling unusual."   Bupropion Nausea And Vomiting    History reviewed. No pertinent family history.   Prior to Admission medications   Medication Sig Start Date End Date Taking? Authorizing Provider  acetaminophen (TYLENOL) 500 MG tablet Take 500 mg by mouth 2 (two) times daily as needed for moderate pain or headache.    [provider]  amLODipine (NORVASC) 10 MG tablet TAKE 1 TABLET BY MOUTH EVERY DAY 02/19/21   [name], MD  Ascorbic Acid (VITAMIN C) 1000 MG tablet Take 1,000 mg by mouth daily.    [provider]  aspirin EC 81 MG tablet Take 81 mg by mouth daily. Swallow whole.    [provider]  Coenzyme Q10 (COQ10) 100 MG CAPS Take 100 mg by mouth daily.    [provider]  gabapentin (NEURONTIN) 300 MG capsule Take 1 capsule (300 mg total) by mouth 3 (three) times daily. 02/13/21   [name], MD  HAWTHORNE BERRY PO Take 1 tablet by mouth daily.    [provider]  irbesartan (AVAPRO) 300 MG tablet Take 300 mg by mouth daily.    [provider]  latanoprost (XALATAN) 0.005 % ophthalmic solution Place 1 drop into both eyes at bedtime.    [provider]  Menthol, Topical Analgesic, (BIOFREEZE EX) Apply 1 application topically daily as needed (pain).    [provider]  OVER THE COUNTER MEDICATION Take 1 tablet by mouth 3 (three) times daily. Jointprin otc supplement    [provider]  QUEtiapine (SEROQUEL) 50 MG tablet Take 50 mg by mouth at bedtime. 11/06/20   [provider]  Red Yeast Rice Extract (RED YEAST RICE PO) Take 2 tablets by mouth daily.    [provider]  sertraline (ZOLOFT) 100 MG tablet Take 100 mg by mouth daily. 10/29/20   [provider]  traMADol (ULTRAM) 50 MG tablet Take 2 tablets (100 mg total) by mouth every 6 (six) hours as needed for up to 7 days (mild pain). 02/24/21 03/03/21  [name], MD  TURMERIC PO Take 1 tablet by mouth daily.    [provider]  vitamin B-12 (CYANOCOBALAMIN) 1000 MCG tablet Take 1,000 mcg by mouth daily.    [provider]    Physical Exam: Vitals:   02/26/21 0500 02/26/21 0541 02/26/21 0601 02/26/21 0744  BP: (!) 158/92   (!) 145/90  Pulse: 97   (!) 107  Resp: 14   (!) 26  Temp:  98.4 F (36.9 C)    TempSrc:  Oral    SpO2: 100%  93% 99%  Weight:      Height:        Constitutional: Awake alert and oriented x3, patient is in respiratory distress Skin: Increased skin pallor noted.  No rashes, no lesions, good skin turgor noted. Eyes: Pupils are equally reactive to light.  Increased conjunctival  pallor noted without scleral icterus.   ENMT: Moist mucous membranes noted.  Posterior pharynx clear of any exudate or lesions.   Neck: normal, supple, no masses, no thyromegaly.  No evidence of jugular venous distension.   Respiratory: extremely coarse breath sounds bilaterally with intermittent expiratory wheezing.  Increased expiratory effort noted without evidence of  accessory muscle use.  Cardiovascular: Tachycardic rate with regular rhythm, no murmurs / rubs / gallops. No extremity edema. 2+ pedal pulses. No carotid bruits.  Chest:   Nontender without crepitus or deformity.   Back:   Nontender without crepitus or deformity. Abdomen: Abdomen is soft and nontender.  No evidence of intra-abdominal masses.  Positive bowel sounds noted in all quadrants.   Musculoskeletal: No joint deformity upper and lower extremities. Good ROM, no contractures. Normal muscle tone.  Neurologic: CN 2-12 grossly intact. Sensation intact.  Patient moving all 4 extremities spontaneously.  Patient is following all commands.  Patient is responsive to verbal stimuli.   Psychiatric: Patient exhibits normal mood with appropriate affect.  Patient seems to possess insight as to their current situation.     Labs on Admission: I have personally reviewed following labs and imaging studies -   CBC: Recent Labs  Lab 02/25/21 2204 02/26/21 0630  WBC 29.4*  --   NEUTROABS 26.0*  --   HGB 13.7 11.6*  HCT 40.2 34.0*  MCV 90.1  --   PLT 473*  --    Basic Metabolic Panel: Recent Labs  Lab 02/25/21 2204 02/26/21 0630  NA 138 139  K 3.8 3.6  CL 99  --   CO2 26  --   GLUCOSE 149*  --   BUN 11  --   CREATININE 0.71  --   CALCIUM 9.3  --    GFR: Estimated Creatinine Clearance: 103.9 mL/min (by C-G formula based on SCr of 0.71 mg/dL). Liver Function Tests: Recent Labs  Lab 02/25/21 2204  AST 21  ALT 28  ALKPHOS 103  BILITOT 1.1  PROT 7.1  ALBUMIN 3.6   Recent Labs  Lab 02/25/21 2204  LIPASE 18   No  results for input(s): AMMONIA in the last 168 hours. Coagulation Profile: Recent Labs  Lab 02/25/21 2204  INR 1.1   Cardiac Enzymes: No results for input(s): CKTOTAL, CKMB, CKMBINDEX, TROPONINI in the last 168 hours. BNP (last 3 results) No results for input(s): PROBNP in the last 8760 hours. HbA1C: No results for input(s): HGBA1C in the last 72 hours. CBG: No results for input(s): GLUCAP in the last 168 hours. Lipid Profile: No results for input(s): CHOL, HDL, LDLCALC, TRIG, CHOLHDL, LDLDIRECT in the last 72 hours. Thyroid Function Tests:  No results for input(s): TSH, T4TOTAL, FREET4, T3FREE, THYROIDAB in the last 72 hours. Anemia Panel: No results for input(s): VITAMINB12, FOLATE, FERRITIN, TIBC, IRON, RETICCTPCT in the last 72 hours. Urine analysis:    Component Value Date/Time   COLORURINE YELLOW 01/18/2021 1500   APPEARANCEUR CLEAR 01/18/2021 1500   LABSPEC 1.006 01/18/2021 1500   PHURINE 6.0 01/18/2021 1500   GLUCOSEU NEGATIVE 01/18/2021 1500   HGBUR SMALL (A) 01/18/2021 1500   BILIRUBINUR NEGATIVE 01/18/2021 1500   KETONESUR NEGATIVE 01/18/2021 1500   PROTEINUR NEGATIVE 01/18/2021 1500   NITRITE NEGATIVE 01/18/2021 1500   LEUKOCYTESUR NEGATIVE 01/18/2021 1500    Radiological Exams on Admission - Personally Reviewed: DG Chest 2 View  Result Date: 02/25/2021 CLINICAL DATA:  History of right partial lobectomy with hemoptysis and increasing shortness of breath EXAM: CHEST - 2 VIEW COMPARISON:  02/20/2021 FINDINGS: Cardiac shadow is stable. Left lung remains clear. Right lung demonstrates right-sided pleural effusion and masslike density superiorly with postsurgical changes. This represents pleural fluid loculated from prior resection and stable in appearance. Some increasing parenchymal density is noted throughout the aerated lung which may represent hemorrhage given the history of hemoptysis. No new mass lesion is seen. IMPRESSION: Stable masslike density in the right apex  consisting of loculated fluid following recent surgery. Increasing interstitial opacity within the right lung which may represent some hemorrhage given the hemoptysis. Small basilar right sided effusion. Electronically Signed   By: Inez Catalina M.D.   On: 02/25/2021 22:27   CT Angio Chest PE W and/or Wo Contrast  Result Date: 02/26/2021 CLINICAL DATA:  hx of R partial lobectomy, has been coughing up blood since about noon, increased shortness of breath, denies home 02, now on 3L [place]. Pulmonary embolus suspected. VATS and thoracotomy surgery 01/22/2021 EXAM: CT ANGIOGRAPHY CHEST WITH CONTRAST TECHNIQUE: Multidetector CT imaging of the chest was performed using the standard protocol during bolus administration of intravenous contrast. Multiplanar CT image reconstructions and MIPs were obtained to evaluate the vascular anatomy. CONTRAST:  47mL OMNIPAQUE IOHEXOL 350 MG/ML SOLN COMPARISON:  CT chest 02/16/2021 FINDINGS: Cardiovascular: Satisfactory opacification of the pulmonary arteries to the segmental level. No evidence of pulmonary embolism. Normal heart size. No pericardial effusion. The thoracic aorta is normal in caliber. Coronary artery calcifications are noted. Mediastinum/Nodes: No enlarged mediastinal, hilar, or axillary lymph nodes. Thyroid gland, trachea, and esophagus demonstrate no significant findings. Lungs/Pleura: Surgical changes related to a partial right lobectomy of the right upper lobe with associated loculated right apical moderate-sized pleural effusion. Persistent trace basilar pleural effusion. Diffuse, right greater than left as well as lower lobe greater than upper lobe, peribronchovascular ground-glass and consolidative opacities. Some peripheral airspace opacities are also noted. Upper Abdomen: No acute abnormality. Musculoskeletal: No chest wall abnormality. No suspicious lytic or blastic osseous lesions. No acute displaced fracture. Redemonstration of subacute right comminuted fifth as  well as right nondisplaced sixth rib fractures that are broken in 2 separate places. Multilevel degenerative changes of the spine. Review of the MIP images confirms the above findings. IMPRESSION: 1. No pulmonary embolus. 2. Diffuse pulmonary findings findings suggestive of alveolar hemorrhage versus infection/inflammation versus pulmonary edema. 3. Similar-appearing loculated moderate sized right apical pleural effusion with limited evaluation due to timing of intravenous contrast. Persistent trace right basilar pleural effusion. 4. Subacute right comminuted fifth as well as right nondisplaced sixth rib fractures that are broken in 2 separate places consistent with history of thoracotomy. 5. Coronary artery calcifications. Electronically Signed   By: Iven Finn  M.D.   On: 02/26/2021 04:59    EKG: Personally reviewed.  Rhythm is sinus tachycardia with heart rate of 109 bpm.  No dynamic ST segment changes appreciated.  Assessment/Plan Principal Problem:   Acute respiratory failure with hypoxia (HCC)  Patient experiencing gradually progressive acute hypoxic respiratory failure This is likely multifactorial secondary to progressive alveolar hemorrhage which is in itself likely secondary to a bacterial pneumonia Primarily, treatment of the alveolar hemorrhage will be supportive care with avoidance of anticoagulants and submental oxygen Furthermore, treatment of concurrent pneumonia is key and improving patient's hypoxia and reducing the degree of bleeding.  Patient has been initiated on broad-spectrum intravenous antibiotic therapy with vancomycin azithromycin and ceftriaxone. Bronchodilator therapy as needed for shortness of breath and wheezing Patient is currently receiving heated high flow oxygen delivery at 25 L and is being monitored closely.  Patient will be admitted to [facility] stepdown unit. Per my discussion with the patient if patient continues to clinically decompensate he is agreeable to  mechanical ventilation. Case has already been discussed between the ER provider and CT surgery Dr. Ricard Dillon.  He will evaluate the patient this morning. Active Problems:    Sepsis due to pneumonia ([place])  Presence of multiple SIRS criteria, diffuse right lung pneumonia and evidence of acute respiratory failure are all suggestive of sepsis Broad-spectrum intravenous antibiotic therapy as mentioned above Aggressive intravenous volume resuscitation Following blood cultures and sputum cultures Clinical monitoring  Lactic acidosis  Patient is now exhibiting substantial lactic acidosis which has developed since hospitalization This is likely primarily secondary to recurrent bouts of hypoxia for progressive alveolar hemorrhage in combination with ongoing bronchodilator use Hydrating patient with intravenous isotonic fluid Providing patient with submental oxygen necessary to achieve ox saturations of 92% and higher Performing serial lactic acid levels to ensure downtrending and resolution.    Essential hypertension  Continue home regimen of irbesartan and amlodipine.    Adenocarcinoma of right lung [facility]) Diagnosed in April 2022. Follows with Dr. Julien Nordmann with oncology Status post recent VATS with right upper lobe lobectomy and chest wall dissection with Dr. Roxan Hockey in mid May    Major depressive disorder Continue home regimen of psychotropic medications   Code Status:  Full code Family Communication: deferred   Status is: Inpatient  Remains inpatient appropriate because:Ongoing diagnostic testing needed not appropriate for outpatient work up, IV treatments appropriate due to intensity of illness or inability to take PO, and Inpatient level of care appropriate due to severity of illness  Dispo: The patient is from: Home              Anticipated d/c is to: Home              Patient currently is not medically stable to d/c.   Difficult to place patient No        Vernelle Emerald MD [facility] Pager [phone]  If 7PM-7AM, please contact night-coverage [email or web address] Use universal [place] password for that web site. If you do not have the password, please call the hospital operator.  02/26/2021, 7:55 AM

## 2021-02-26 NOTE — Progress Notes (Signed)
Notified bedside nurse of need to draw repeat lactic acid. 

## 2021-02-26 NOTE — ED Notes (Addendum)
  MD Shalhoub made aware of lactic 3.7

## 2021-02-26 NOTE — Progress Notes (Signed)
Pharmacy Antibiotic Note  Christopher Carter is a 60 y.o. male admitted on 02/25/2021 with sepsis.  Pharmacy has been consulted for Vancomycin dosing. Last month pt had a right thoracotomy/lobectomy.   Received ceftriaxone/azithromycin in ED - WBC 29, Scr 0.7 (CrCl >100 mL/min). LA 3.7. Will add cefepime to regimen.  Plan: -Vancomycin 1250 mg IV q12h >>Estimated AUC: 456 -Cefepime 2g IV every 8 hours  -Trend WBC, temp, renal function  -F/U infectious work-up -Drug levels as indicated   Height: 5\' 10"  (177.8 cm) Weight: 83.9 kg (185 lb) IBW/kg (Calculated) : 73  Temp (24hrs), Avg:98.5 F (36.9 C), Min:98.4 F (36.9 C), Max:98.6 F (37 C)  Recent Labs  Lab 02/25/21 2204 02/26/21 0241 02/26/21 0403  WBC 29.4*  --   --   CREATININE 0.71  --   --   LATICACIDVEN  --  1.6 3.7*     Estimated Creatinine Clearance: 103.9 mL/min (by C-G formula based on SCr of 0.71 mg/dL).    Allergies  Allergen Reactions   Oxycodone Hcl Other (See Comments)    Pt reported "seeing things" and " feeling unusual."   Bupropion Nausea And Vomiting    Antonietta Jewel, PharmD, [place] Pharmacist  Phone: [phone] 02/26/2021 10:07 AM  Please check AMION for all [place] phone numbers After 10:00 PM, call [place] [phone]

## 2021-02-27 ENCOUNTER — Ambulatory Visit (HOSPITAL_COMMUNITY): Payer: 59

## 2021-02-27 ENCOUNTER — Inpatient Hospital Stay (HOSPITAL_COMMUNITY): Payer: 59

## 2021-02-27 ENCOUNTER — Encounter (HOSPITAL_COMMUNITY): Payer: Self-pay

## 2021-02-27 DIAGNOSIS — Z9689 Presence of other specified functional implants: Secondary | ICD-10-CM | POA: Diagnosis not present

## 2021-02-27 DIAGNOSIS — C3491 Malignant neoplasm of unspecified part of right bronchus or lung: Secondary | ICD-10-CM | POA: Diagnosis not present

## 2021-02-27 DIAGNOSIS — J189 Pneumonia, unspecified organism: Secondary | ICD-10-CM

## 2021-02-27 DIAGNOSIS — A419 Sepsis, unspecified organism: Principal | ICD-10-CM

## 2021-02-27 DIAGNOSIS — R042 Hemoptysis: Secondary | ICD-10-CM | POA: Diagnosis not present

## 2021-02-27 DIAGNOSIS — J9601 Acute respiratory failure with hypoxia: Secondary | ICD-10-CM | POA: Diagnosis not present

## 2021-02-27 LAB — CBC WITH DIFFERENTIAL/PLATELET
Abs Immature Granulocytes: 0.15 10*3/uL — ABNORMAL HIGH (ref 0.00–0.07)
Basophils Absolute: 0.1 10*3/uL (ref 0.0–0.1)
Basophils Relative: 0 %
Eosinophils Absolute: 0.8 10*3/uL — ABNORMAL HIGH (ref 0.0–0.5)
Eosinophils Relative: 4 %
HCT: 34.1 % — ABNORMAL LOW (ref 39.0–52.0)
Hemoglobin: 11.6 g/dL — ABNORMAL LOW (ref 13.0–17.0)
Immature Granulocytes: 1 %
Lymphocytes Relative: 5 %
Lymphs Abs: 1.1 10*3/uL (ref 0.7–4.0)
MCH: 31.3 pg (ref 26.0–34.0)
MCHC: 34 g/dL (ref 30.0–36.0)
MCV: 91.9 fL (ref 80.0–100.0)
Monocytes Absolute: 1.4 10*3/uL — ABNORMAL HIGH (ref 0.1–1.0)
Monocytes Relative: 7 %
Neutro Abs: 17.2 10*3/uL — ABNORMAL HIGH (ref 1.7–7.7)
Neutrophils Relative %: 83 %
Platelets: 378 10*3/uL (ref 150–400)
RBC: 3.71 MIL/uL — ABNORMAL LOW (ref 4.22–5.81)
RDW: 14.8 % (ref 11.5–15.5)
WBC: 20.7 10*3/uL — ABNORMAL HIGH (ref 4.0–10.5)
nRBC: 0 % (ref 0.0–0.2)

## 2021-02-27 LAB — COMPREHENSIVE METABOLIC PANEL
ALT: 19 U/L (ref 0–44)
AST: 21 U/L (ref 15–41)
Albumin: 3 g/dL — ABNORMAL LOW (ref 3.5–5.0)
Alkaline Phosphatase: 75 U/L (ref 38–126)
Anion gap: 8 (ref 5–15)
BUN: 6 mg/dL (ref 6–20)
CO2: 27 mmol/L (ref 22–32)
Calcium: 9 mg/dL (ref 8.9–10.3)
Chloride: 102 mmol/L (ref 98–111)
Creatinine, Ser: 0.52 mg/dL — ABNORMAL LOW (ref 0.61–1.24)
GFR, Estimated: >60 mL/min (ref >60)
Glucose, Bld: 121 mg/dL — ABNORMAL HIGH (ref 70–99)
Potassium: 3.8 mmol/L (ref 3.5–5.1)
Sodium: 137 mmol/L (ref 135–145)
Total Bilirubin: 1 mg/dL (ref 0.3–1.2)
Total Protein: 5.9 g/dL — ABNORMAL LOW (ref 6.5–8.1)

## 2021-02-27 LAB — LEGIONELLA PNEUMOPHILA SEROGP 1 UR AG: L. pneumophila Serogp 1 Ur Ag: NEGATIVE

## 2021-02-27 LAB — MAGNESIUM: Magnesium: 1.9 mg/dL (ref 1.7–2.4)

## 2021-02-27 LAB — PROTIME-INR
INR: 1.1 (ref 0.8–1.2)
Prothrombin Time: 14.6 seconds (ref 11.4–15.2)

## 2021-02-27 LAB — CORTISOL-AM, BLOOD: Cortisol - AM: 19.6 ug/dL (ref 6.7–22.6)

## 2021-02-27 LAB — PROCALCITONIN: Procalcitonin: <0.1 ng/mL

## 2021-02-27 IMAGING — DX DG CHEST 1V PORT
2 series · 2 of 2 positions shown · non-contrast
Comparison: Portable exam [5A] hours compared to [DATE]

CLINICAL DATA: New hemoptysis, fatigue, worsening shortness of
breath, hypoxia

EXAM:
PORTABLE CHEST 1 VIEW

[chest ap (1 of 2)]
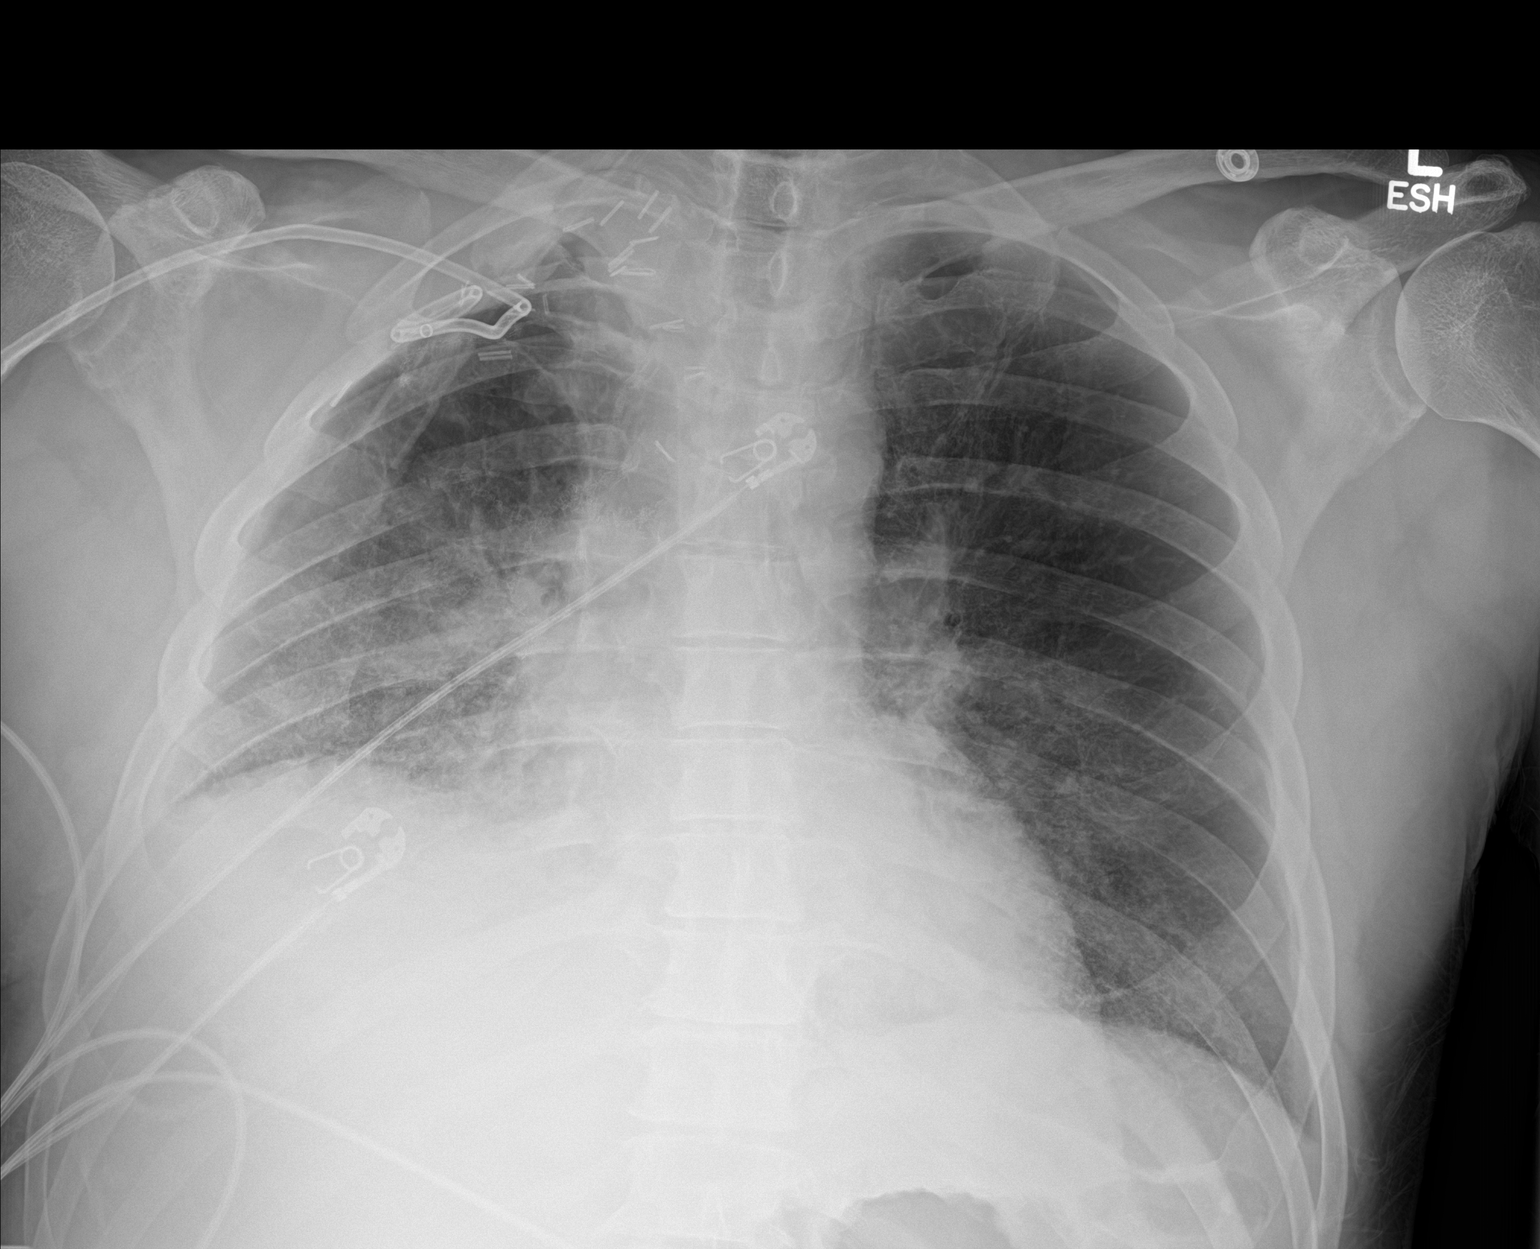

[chest ap (2 of 2)]
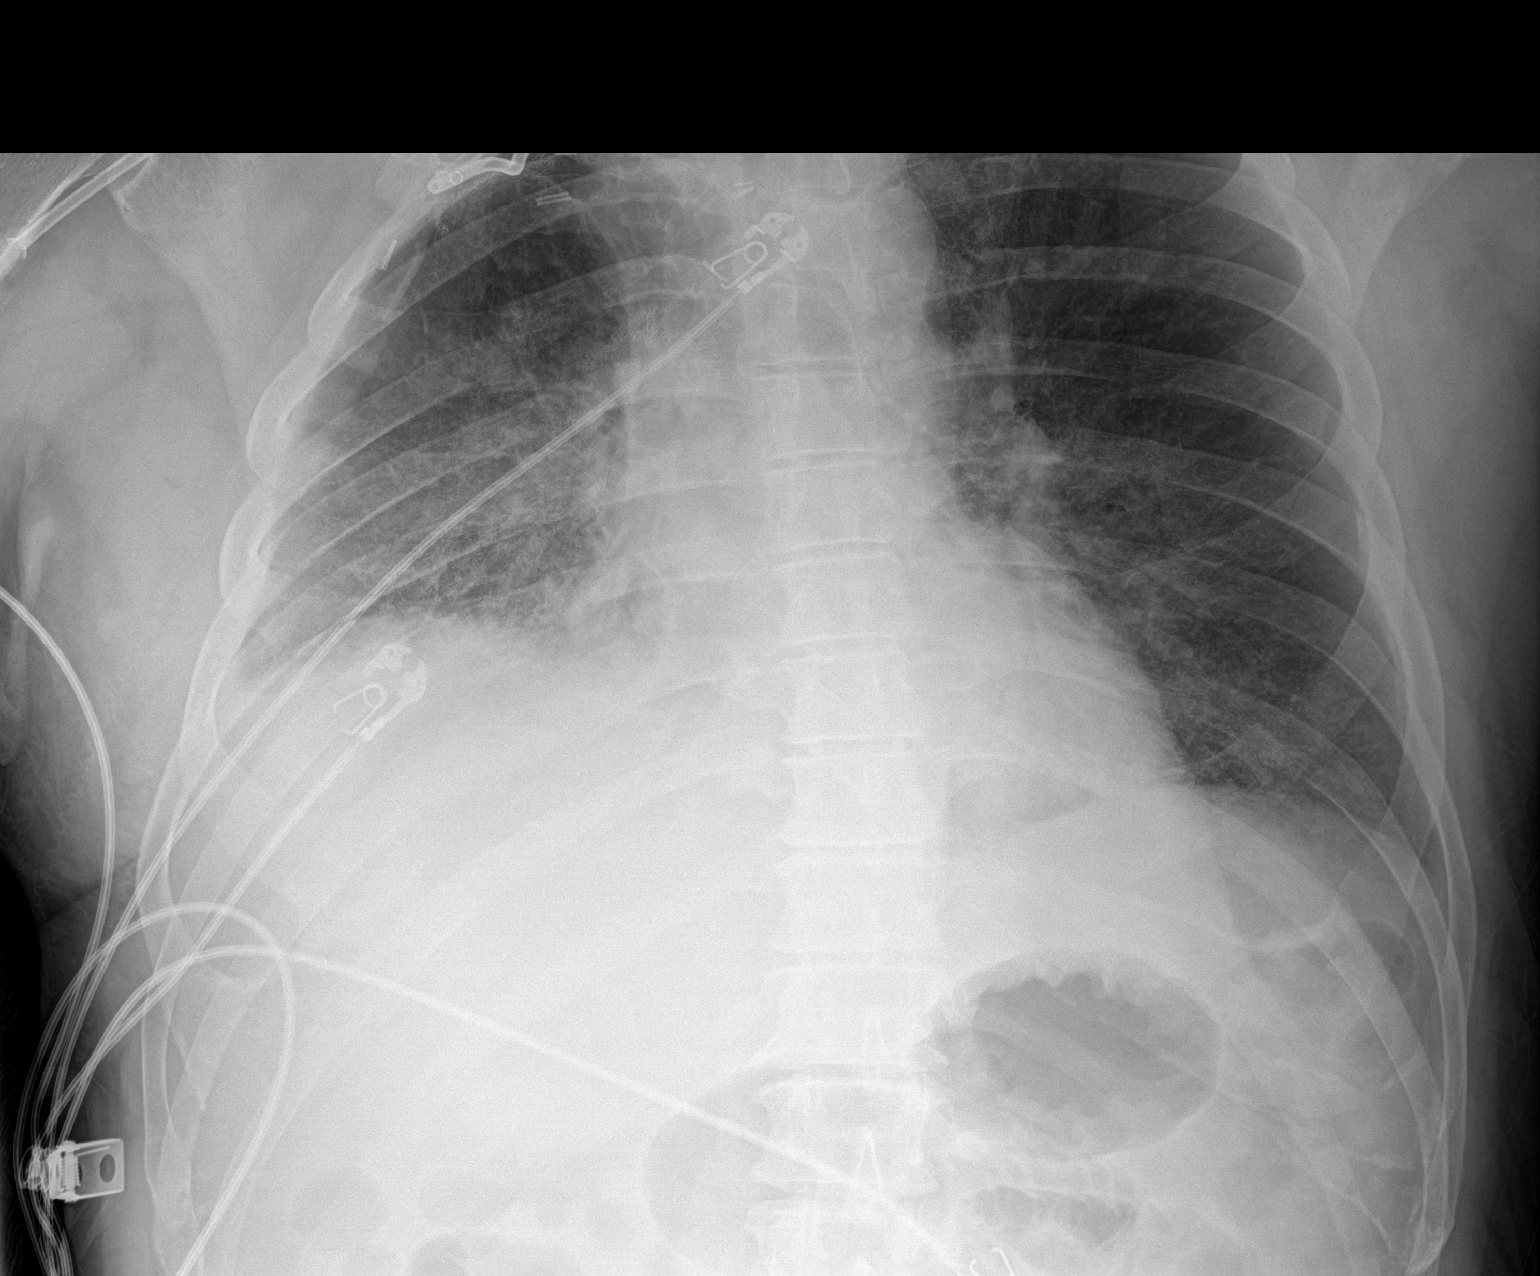

[2 of 2 positions shown; findings below may reference images not displayed]

FINDINGS: Pigtail thoracostomy tube at RIGHT apex with decrease in loculated
pleural effusion.

Small loculated pneumothorax at RIGHT apex.

Stable heart size and mediastinal contours.

BILATERAL pulmonary infiltrates in the mid to lower lungs, increased
on LEFT since previous exam.

No acute osseous findings.
IMPRESSION: Small residual loculated hydropneumothorax at RIGHT apex post chest
tube insertion.

BILATERAL mid to lower lung pulmonary infiltrates, slightly
increased.

## 2021-02-27 NOTE — Progress Notes (Signed)
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 Of note patient was admitted in [facility] from 5/16 until 5/20.  During that hospitalization patient underwent right upper lobe lobectomy and chest wall dissection ( VATS) by Dr. Roxan Hockey.  Patient was eventually discharged without complication on 1/44. He was fine until  02/26/21. He is found to have leukocytosis 29.4, Lactic acid 3.7, CT chest revealed diffuse infiltrates concerning for alveolar hemorrhage with possible superimposed infectious process versus pulmonary edema.  Patient was started on IV antibiotics ( ceftriaxone, Zithromax and vancomycin).   Patient was seen by pulmonology recommended chest tube placement for hemothorax.  Cardiothoracic surgery consulted . He underwent  Right pigtail catheter on 6/21. Patient is admitted for acute hypoxic respiratory failure requiring 25 L of heated high flow oxygen.  Plan is to do bronchoscopy once he is weaned down to low oxygen requirement.   Assessment & Plan:   Principal Problem:   Acute respiratory failure with hypoxia (HCC) Active Problems:   Essential hypertension   Pulmonary alveolar hemorrhage   Adenocarcinoma of right lung (HCC)   Pneumonia of right lung due to infectious organism   Sepsis due to pneumonia ([place])   Major depressive disorder  Acute hypoxic respiratory failure in the setting of RUL loculated pleural effusion.: Patient presented with hemoptysis, cough and acute hypoxia requiring 3 L of supplemental oxygen on arrival. His hypoxia has worsened, is uptitrated to 25L of heated high flow oxygen, satting 91%. This is multifactorial sec. to progressive alveolar  hemorrhage and may be evolving pneumonia. Pulmonology consulted, recommended to continue bronchodilator therapy, continue steroids, continue antibiotics. Patient underwent pigtail chest tube placement by IR on 5/21, tolerated well. Continue high flow supplemental oxygen and wean down  as tolerated. Continue cough suppression given hemoptysis. Continue ceftriaxone and vancomycin, Zithromax discontinued. Plan is to do bronchoscopy once he is weaned down to low oxygen requirement.  Severe Sepsis due to pneumonia; Patient presented with multiple SIRS(tachycardia, tachycardia ,leukocytosis, chest x-ray infiltrate) criteria with lactic acidosis. Continue broad-spectrum antibiotics(ceftriaxone, vancomycin and azithromycin.) Azithromycin discontinued today as per pulmonology, follow blood cultures and sputum cultures.  Lactic acidosis > Resolved with aggressive IV resuscitation.  Essential hypertension: Continue Avapro and amlodipine  Major depressive disorder: continue home antidepressant medications.  Adenocarcinoma of right lung: Diagnosed in April 2022, follows with Dr. Julien Nordmann Status post recent VATS with right upper lobe lobectomy and chest wall dissection by Dr. Koleen Nimrod in mid May.  Outpatient follow-up    DVT prophylaxis: SCDs Code Status: Full code. Family Communication: Wife at bed side. Disposition Plan:    Status is: Inpatient  Remains inpatient appropriate because:Inpatient level of care appropriate due to severity of illness  Dispo: The patient is from: Home              Anticipated d/c is to: Home              Patient currently is not medically stable to d/c.   Difficult to place patient No  Consultants:  Cardiothoracic surgery IR Pulmonary  Procedures: Right pigtail chest tube placement Antimicrobials:   Anti-infectives (From admission, onward)    Start     Dose/Rate Route Frequency Ordered Stop  02/27/21 0300  cefTRIAXone (ROCEPHIN) 2 g in sodium  chloride 0.9 % 100 mL IVPB  Status:  Discontinued        2 g 200 mL/hr over 30 Minutes Intravenous Every 24 hours 02/26/21 0603 02/26/21 0938   02/27/21 0300  azithromycin (ZITHROMAX) 500 mg in sodium chloride 0.9 % 250 mL IVPB  Status:  Discontinued        500 mg 250 mL/hr over 60 Minutes Intravenous Every 24 hours 02/26/21 0603 02/27/21 0816   02/26/21 1200  ceFEPIme (MAXIPIME) 2 g in sodium chloride 0.9 % 100 mL IVPB        2 g 200 mL/hr over 30 Minutes Intravenous Every 8 hours 02/26/21 1016     02/26/21 1000  vancomycin (VANCOREADY) IVPB 1250 mg/250 mL        1,250 mg 166.7 mL/hr over 90 Minutes Intravenous Every 12 hours 02/26/21 0444     02/26/21 0215  vancomycin (VANCOREADY) IVPB 1500 mg/300 mL        1,500 mg 150 mL/hr over 120 Minutes Intravenous  Once 02/26/21 0203 02/26/21 0636   02/26/21 0215  cefTRIAXone (ROCEPHIN) 2 g in sodium chloride 0.9 % 100 mL IVPB        2 g 200 mL/hr over 30 Minutes Intravenous  Once 02/26/21 0203 02/26/21 0347   02/26/21 0215  azithromycin (ZITHROMAX) 500 mg in sodium chloride 0.9 % 250 mL IVPB        500 mg 250 mL/hr over 60 Minutes Intravenous  Once 02/26/21 0203 02/26/21 0444        Subjective: Patient was seen and examined at bedside.  Overnight events noted.  Patient reports feeling better , still requiring 25 L of heated high flow nasal cannula,  feels uncomfortable with the chest tube.  He reports breathing is improving,  denies any further hemoptysis.  Objective: Vitals:   02/27/21 0200 02/27/21 0350 02/27/21 0400 02/27/21 0904  BP: (!) 148/91  123/84 (!) 149/83  Pulse: 92 95 98 (!) 101  Resp: 20 20 20 19   Temp:   98.6 F (37 C)   TempSrc:   Oral   SpO2:  96% 95% 91%  Weight:      Height:        Intake/Output Summary (Last 24 hours) at 02/27/2021 1147 Last data filed at 02/27/2021 0600 Gross per 24 hour  Intake 2183.82 ml  Output 1650 ml  Net 533.82 ml   Filed Weights   02/26/21 0208  Weight: 83.9 kg     Examination:  General exam: Appears calm , tachypneic, uncomfortable, ill looking Respiratory system: Clear to auscultation. Respiratory effort normal. Right chest tube noted. Cardiovascular system: S1 & S2 heard, RRR. No JVD, murmurs, rubs, gallops or clicks. No pedal edema. Gastrointestinal system: Abdomen is nondistended, soft and nontender. No organomegaly or masses felt. Normal bowel sounds heard. Central nervous system: Alert and oriented. No focal neurological deficits. Extremities: Symmetric 5 x 5 power. No edema, No cyanosis. Skin: No rashes, lesions or ulcers Psychiatry: Judgement and insight appear normal. Mood & affect appropriate.     Data Reviewed: I have personally reviewed following labs and imaging studies  CBC: Recent Labs  Lab 02/25/21 2204 02/26/21 0630 02/27/21 0026  WBC 29.4*  --  20.7*  NEUTROABS 26.0*  --  17.2*  HGB 13.7 11.6* 11.6*  HCT 40.2 34.0* 34.1*  MCV 90.1  --  91.9  PLT 473*  --  858   Basic Metabolic Panel: Recent Labs  Lab 02/25/21 2204 02/26/21 0630 02/27/21 0026  NA 138 139 137  K 3.8 3.6 3.8  CL 99  --  102  CO2 26  --  27  GLUCOSE 149*  --  121*  BUN 11  --  6  CREATININE 0.71  --  0.52*  CALCIUM 9.3  --  9.0  MG  --   --  1.9   GFR: Estimated Creatinine Clearance: 103.9 mL/min (A) (by C-G formula based on SCr of 0.52 mg/dL (L)). Liver Function Tests: Recent Labs  Lab 02/25/21 2204 02/27/21 0026  AST 21 21  ALT 28 19  ALKPHOS 103 75  BILITOT 1.1 1.0  PROT 7.1 5.9*  ALBUMIN 3.6 3.0*   Recent Labs  Lab 02/25/21 2204  LIPASE 18   No results for input(s): AMMONIA in the last 168 hours. Coagulation Profile: Recent Labs  Lab 02/25/21 2204 02/27/21 0026  INR 1.1 1.1   Cardiac Enzymes: No results for input(s): CKTOTAL, CKMB, CKMBINDEX, TROPONINI in the last 168 hours. BNP (last 3 results) No results for input(s): PROBNP in the last 8760 hours. HbA1C: No results for input(s): HGBA1C in the last 72  hours. CBG: Recent Labs  Lab 02/26/21 1101 02/26/21 1700 02/26/21 2041  GLUCAP 135* 147* 135*   Lipid Profile: No results for input(s): CHOL, HDL, LDLCALC, TRIG, CHOLHDL, LDLDIRECT in the last 72 hours. Thyroid Function Tests: No results for input(s): TSH, T4TOTAL, FREET4, T3FREE, THYROIDAB in the last 72 hours. Anemia Panel: No results for input(s): VITAMINB12, FOLATE, FERRITIN, TIBC, IRON, RETICCTPCT in the last 72 hours. Sepsis Labs: Recent Labs  Lab 02/26/21 0241 02/26/21 0403 02/26/21 1215 02/27/21 0026  PROCALCITON  --   --   --  <0.10  LATICACIDVEN 1.6 3.7* 1.1  --     Recent Results (from the past 240 hour(s))  Resp Panel by RT-PCR (Flu A&B, Covid) Nasopharyngeal Swab     Status: None   Collection Time: 02/26/21  2:13 AM   Specimen: Nasopharyngeal Swab; Nasopharyngeal(NP) swabs in vial transport medium  Result Value Ref Range Status   SARS Coronavirus 2 by RT PCR NEGATIVE NEGATIVE Final    Comment: (NOTE) SARS-CoV-2 target nucleic acids are NOT DETECTED.  The SARS-CoV-2 RNA is generally detectable in upper respiratory specimens during the acute phase of infection. The lowest concentration of SARS-CoV-2 viral copies this assay can detect is 138 copies/mL. A negative result does not preclude SARS-Cov-2 infection and should not be used as the sole basis for treatment or other patient management decisions. A negative result may occur with  improper specimen collection/handling, submission of specimen other than nasopharyngeal swab, presence of viral mutation(s) within the areas targeted by this assay, and inadequate number of viral copies(<138 copies/mL). A negative result must be combined with clinical observations, patient history, and epidemiological information. The expected result is Negative.  Fact Sheet for Patients:  EntrepreneurPulse.com.au  Fact Sheet for Healthcare Providers:  IncredibleEmployment.be  This test is  no t yet approved or cleared by the [place] FDA and  has been authorized for detection and/or diagnosis of SARS-CoV-2 by FDA under an Emergency Use Authorization (EUA). This EUA will remain  in effect (meaning this test can be used) for the duration of the COVID-19 declaration under Section 564(b)(1) of the Act, 21 U.S.C.section 360bbb-3(b)(1), unless the authorization is terminated  or revoked sooner.       Influenza A by PCR NEGATIVE NEGATIVE Final   Influenza B by PCR NEGATIVE NEGATIVE Final  Comment: (NOTE) The Xpert Xpress SARS-CoV-2/FLU/RSV plus assay is intended as an aid in the diagnosis of influenza from Nasopharyngeal swab specimens and should not be used as a sole basis for treatment. Nasal washings and aspirates are unacceptable for Xpert Xpress SARS-CoV-2/FLU/RSV testing.  Fact Sheet for Patients: EntrepreneurPulse.com.au  Fact Sheet for Healthcare Providers: IncredibleEmployment.be  This test is not yet approved or cleared by the [place] FDA and has been authorized for detection and/or diagnosis of SARS-CoV-2 by FDA under an Emergency Use Authorization (EUA). This EUA will remain in effect (meaning this test can be used) for the duration of the COVID-19 declaration under Section 564(b)(1) of the Act, 21 U.S.C. section 360bbb-3(b)(1), unless the authorization is terminated or revoked.  Performed at [facility], [place] [address], [place], [place] [address]   Blood Culture (routine x 2)     Status: None (Preliminary result)   Collection Time: 02/26/21  2:59 AM   Specimen: BLOOD RIGHT FOREARM  Result Value Ref Range Status   Specimen Description BLOOD RIGHT FOREARM  Final   Special Requests   Final    BOTTLES DRAWN AEROBIC AND ANAEROBIC Blood Culture adequate volume   Culture   Final    NO GROWTH 1 DAY Performed at [facility], [place] [address] [place], [place] [address]    Report Status PENDING   Incomplete  MRSA Next Gen by PCR, Nasal     Status: None   Collection Time: 02/26/21  6:04 AM   Specimen: Nasal Mucosa; Nasal Swab  Result Value Ref Range Status   MRSA by PCR Next Gen NOT DETECTED NOT DETECTED Final    Comment: (NOTE) The GeneXpert MRSA Assay (FDA approved for NASAL specimens only), is one component of a comprehensive MRSA colonization surveillance program. It is not intended to diagnose MRSA infection nor to guide or monitor treatment for MRSA infections. Test performance is not FDA approved in patients less than 68 years old. Performed at [facility], [place] [address], [place], [place] [address]   Blood Culture (routine x 2)     Status: None (Preliminary result)   Collection Time: 02/26/21 12:15 PM   Specimen: BLOOD RIGHT HAND  Result Value Ref Range Status   Specimen Description BLOOD RIGHT HAND  Final   Special Requests   Final    BOTTLES DRAWN AEROBIC AND ANAEROBIC Blood Culture adequate volume   Culture   Final    NO GROWTH < 24 HOURS Performed at [facility], [place] [address], [place],  [address]    Report Status PENDING  Incomplete    Radiology Studies: DG Chest 2 View  Result Date: 02/25/2021 CLINICAL DATA:  History of right partial lobectomy with hemoptysis and increasing shortness of breath EXAM: CHEST - 2 VIEW COMPARISON:  02/20/2021 FINDINGS: Cardiac shadow is stable. Left lung remains clear. Right lung demonstrates right-sided pleural effusion and masslike density superiorly with postsurgical changes. This represents pleural fluid loculated from prior resection and stable in appearance. Some increasing parenchymal density is noted throughout the aerated lung which may represent hemorrhage given the history of hemoptysis. No new mass lesion is seen. IMPRESSION: Stable masslike density in the right apex consisting of loculated fluid following recent surgery. Increasing interstitial opacity within the right lung which may represent some  hemorrhage given the hemoptysis. Small basilar right sided effusion. Electronically Signed   By: Inez Catalina M.D.   On: 02/25/2021 22:27   CT Angio Chest PE W  and/or Wo Contrast  Result Date: 02/26/2021 CLINICAL DATA:  hx of R partial lobectomy, has been coughing up blood since about noon, increased shortness of breath, denies home 02, now on 3L [place]. Pulmonary embolus suspected. VATS and thoracotomy surgery 01/22/2021 EXAM: CT ANGIOGRAPHY CHEST WITH CONTRAST TECHNIQUE: Multidetector CT imaging of the chest was performed using the standard protocol during bolus administration of intravenous contrast. Multiplanar CT image reconstructions and MIPs were obtained to evaluate the vascular anatomy. CONTRAST:  52mL OMNIPAQUE IOHEXOL 350 MG/ML SOLN COMPARISON:  CT chest 02/16/2021 FINDINGS: Cardiovascular: Satisfactory opacification of the pulmonary arteries to the segmental level. No evidence of pulmonary embolism. Normal heart size. No pericardial effusion. The thoracic aorta is normal in caliber. Coronary artery calcifications are noted. Mediastinum/Nodes: No enlarged mediastinal, hilar, or axillary lymph nodes. Thyroid gland, trachea, and esophagus demonstrate no significant findings. Lungs/Pleura: Surgical changes related to a partial right lobectomy of the right upper lobe with associated loculated right apical moderate-sized pleural effusion. Persistent trace basilar pleural effusion. Diffuse, right greater than left as well as lower lobe greater than upper lobe, peribronchovascular ground-glass and consolidative opacities. Some peripheral airspace opacities are also noted. Upper Abdomen: No acute abnormality. Musculoskeletal: No chest wall abnormality. No suspicious lytic or blastic osseous lesions. No acute displaced fracture. Redemonstration of subacute right comminuted fifth as well as right nondisplaced sixth rib fractures that are broken in 2 separate places. Multilevel degenerative changes of the spine.  Review of the MIP images confirms the above findings. IMPRESSION: 1. No pulmonary embolus. 2. Diffuse pulmonary findings findings suggestive of alveolar hemorrhage versus infection/inflammation versus pulmonary edema. 3. Similar-appearing loculated moderate sized right apical pleural effusion with limited evaluation due to timing of intravenous contrast. Persistent trace right basilar pleural effusion. 4. Subacute right comminuted fifth as well as right nondisplaced sixth rib fractures that are broken in 2 separate places consistent with history of thoracotomy. 5. Coronary artery calcifications. Electronically Signed   By: Iven Finn M.D.   On: 02/26/2021 04:59   DG Chest Port 1 View  Result Date: 02/27/2021 CLINICAL DATA:  New hemoptysis, fatigue, worsening shortness of breath, hypoxia EXAM: PORTABLE CHEST 1 VIEW COMPARISON:  Portable exam 0835 hours compared to 02/25/2021 FINDINGS: Pigtail thoracostomy tube at RIGHT apex with decrease in loculated pleural effusion. Small loculated pneumothorax at RIGHT apex. Stable heart size and mediastinal contours. BILATERAL pulmonary infiltrates in the mid to lower lungs, increased on LEFT since previous exam. No acute osseous findings. IMPRESSION: Small residual loculated hydropneumothorax at RIGHT apex post chest tube insertion. BILATERAL mid to lower lung pulmonary infiltrates, slightly increased. Electronically Signed   By: Lavonia Dana M.D.   On: 02/27/2021 11:20   CT IMAGE GUIDED DRAINAGE BY PERCUTANEOUS CATHETER  Result Date: 02/26/2021 INDICATION: 60 year old male with a history recent surgery, presentation with hemoptysis, and loculated fluid of the surgery site. He has been referred for CT-guided chest tube placement EXAM: CT-GUIDED CHEST TUBE MEDICATIONS: The patient is currently admitted to the hospital and receiving intravenous antibiotics. The antibiotics were administered within an appropriate time frame prior to the initiation of the procedure.  ANESTHESIA/SEDATION: 1.0 mg IV Versed 25 mcg IV Fentanyl Moderate Sedation Time:  16 minutes The patient was continuously monitored during the procedure by the interventional radiology nurse under my direct supervision. COMPLICATIONS: None TECHNIQUE: Informed written consent was obtained from the patient after a thorough discussion of the procedural risks, benefits and alternatives. All questions were addressed. Maximal Sterile Barrier Technique was utilized including caps, mask, sterile  gowns, sterile gloves, sterile drape, hand hygiene and skin antiseptic. A timeout was performed prior to the initiation of the procedure. PROCEDURE: Patient was positioned in the supine position on the CT gantry table and a scout CT of the chest was performed for planning purposes. Once angle of approach was determined, the skin and subcutaneous tissues this scan was prepped and draped in the usual sterile fashion, and a sterile drape was applied covering the operative field. A sterile gown and sterile gloves were used for the procedure. Local anesthesia was provided with 1% Lidocaine. The skin and subcutaneous tissues were infiltrated 1% lidocaine for local anesthesia, and a small stab incision was made with an 11 blade scalpel. Using CT guidance, a 18 gauge trocar needle was advanced into the pleural space of the right apex. After confirmation of the tip, modified Seldinger technique was used to place a 10 French pigtail drainage catheter. Approximately 60 cc of reddish thin fluid aspirated. Culture was sent to the lab for analysis. Catheter was then attached to water seal chamber and suction was applied confirming a operational chest tube. Retention suture was placed.  Sterile dressing was placed. Patient tolerated the procedure well and remained hemodynamically stable throughout. No complications were encountered and no significant blood loss was encounter FINDINGS: The scout CT demonstrates essentially a similar distribution  and volume of confluent ground-glass opacity of the right lower lobe, right middle lobe, and left lower lobe. No significant fluid identified within the airways. Similar appearance of the loculated fluid collection within the surgical site at the right apex. After placement of the chest tube approximately 60 cc of thin reddish fluid aspirated which appears to be serosanguineous. No frank abscess. Culture was sent. IMPRESSION: Status post CT-guided right apical chest tube into loculated fluid revealing serosanguineous fluid. Culture was sent. Signed, Dulcy Fanny. Dellia Nims, RPVI [facility] Radiology Electronically Signed   By: Corrie Mckusick D.O.   On: 02/26/2021 17:09    Scheduled Meds:  amLODipine  10 mg Oral Daily   calcium carbonate  1 tablet Oral TID   gabapentin  300 mg Oral TID   irbesartan  300 mg Oral Daily   latanoprost  1 drop Both Eyes QHS   QUEtiapine  50 mg Oral QHS   sertraline  100 mg Oral Daily   Continuous Infusions:  ceFEPime (MAXIPIME) IV 2 g (02/27/21 0401)   promethazine (PHENERGAN) injection (IM or IVPB)     vancomycin 1,250 mg (02/27/21 0909)     LOS: 1 day    Time spent: 35 mins    Vernard Gram, MD [facility]   If 7PM-7AM, please contact night-coverage

## 2021-02-27 NOTE — Consult Note (Signed)
NAME:  Christ[facility], MRN:  [id number], DOB:  05/19/1961, LOS: 1 ADMISSION DATE:  02/25/2021, CONSULTATION DATE:  6/20 REFERRING MD:  Dwyane Dee, CHIEF COMPLAINT:  respiratory failure and hemoptysis    History of Present Illness:  This is a 60 year old non-smoker.  Recently status post right upper lobe lobectomy with node dissection 5/16 for T4, N0, stage IIIa adenocarcinoma.  Surgical notes stated there was still residual tumor posterior near spine which was unable to be resected.  He is yet to undergo chemo or radiation.  Had been recovering slowly with expected postoperative pain.  Started to note decreased energy, more fatigue, overall malaise sometime around 6/15.  Had been seen the day prior by cardiothoracic's who noted loculated right upper lobe collection from surgical site for which plan CT-guided aspiration was planned for 6/21.  On 6/19 he continued to feel weak and fatigued, around midday he began to cough up blood.  Noting about a tablespoon at a time, reporting it is being bright red, and at its worst up to 6 times an hour.  At sometimes the cough would be so intense that it would make him nauseated and he would also vomit because of this, and worsening shortness of breath he reported to the emergency room around 8 PM on 6/19 .  In the ER he was found to be hypoxic requiring titration of oxygen up to 100% on heated high flow.  Initial CT imaging on 6/20 was negative for pulmonary embolus, there is a loculated moderate size right apical pleural fluid collection, diffuse bilateral airspace disease, and subacute right fifth and nondisplaced sixth rib fractures secondary to prior "thoracotomy.  He was admitted by the internal medicine service, cardiothoracic surgery is already consulted, because of the supplemental oxygen need pulmonary asked to evaluate  Pertinent  Medical History  Hypertension, depression.  Right upper lobe lung mass status post thoracotomy and right upper lobectomy with node  dissection 5/16.  Mass noted to be invading the chest wall with positive margins posteriorly near the spine.  He is T4, N0, stage IIIa adenocarcinoma.  Surgical notes document residual tumor posteriorly near the spine which was unable to be resected.  He has yet to undergo adjuvant radiation or chemo Last seen in cardiothoracic clinic on 6/17 at which time imaging noted to see a new loculated right upper lobe fluid collection and there was plan to proceed with CT-guided aspiration Significant Hospital Events: Including procedures, antibiotic start and stop dates in addition to other pertinent events   6/19 admitted with new hemoptysis,, fatigue, and worsening shortness of breath.  Found to be hypoxic on arrival requiring titration of supplemental oxygen up to heated high flow.  Started on azithromycin, ceftriaxone, and vancomycin  6/20   CT imaging obtained showing previously identified loculated right apical pleural effusion, negative for pulmonary emboli.  There is diffuse alveolar airspace disease.  Pulmonary asked to evaluate.  Interventional radiology consulted 6/20 IR placed anterior chestube into loculated effusion  Interim History / Subjective:  Still feeling sob. Feeling nauseated and would like to eat. No further hemoptysis  Objective   Blood pressure 123/84, pulse 98, temperature 98.6 F (37 C), temperature source Oral, resp. rate 20, height 5\' 10"  (1.778 m), weight 83.9 kg, SpO2 95 %.    FiO2 (%):  [80 %-100 %] 80 %   Intake/Output Summary (Last 24 hours) at 02/27/2021 0817 Last data filed at 02/27/2021 0600 Gross per 24 hour  Intake 2183.82 ml  Output 1590 ml  Net 593.82 ml    Filed Weights   02/26/21 0208  Weight: 83.9 kg    Examination: General: acutely ill appearing, tachypnic and uncomfortable on hi flow oxygen HENT: mmm Lungs: diffuse bilateral rhonchi, anterior chest tube Cardiovascular: tachycardic, regular Abdomen: Soft not tender no organomegaly Extremities:  Warm dry with brisk capillary refill Neuro: Awake oriented no focal deficits  Labs/imaging that I havepersonally reviewed  (right click and "Reselect all SmartList Selections" daily)  Reviewed -  Procalcitonin low Culture pending from pleural fluid WBC - 20.7  Resolved Hospital Problem list     Assessment & Plan:  Acute hypoxic respiratory failure (POA) in the setting of diffuse pulmonary infiltrates with associated hemoptysis, and right upper lobe loculated pleural effusion -s/p chest tube placement, RUL anterior fluid collection, possibly hemothorax -His degree of hypoxia is a contraindication to bronchoscopy  unless requires intubation Plan - stop azithromycin as this is very unlikely to be an atypical infection and he is very nauseated. - Continue vanc and cetriaxone for now - Continue high flow supplemental oxygen - Cough suppression given hemoptysis - I have ordered an A.m. chest x-ray. Based on residual fluid may considered intrapleural fibrinolytics. Will need to send additional pleural fluid studies: glucose, protein, LDH, hematocrit, cell count, cytology.   Severe sepsis secondary to above Plan IV hydration Continue antibiotics as above  Stage IIIa adenocarcinoma of the lung status post right upper lobe lobectomy, yet to undergo adjunct of therapy Plan Follow-up at his outpatient  History of hypertension Plan Continuing his home Norvasc and Avapro  History of anxiety and depression Plan Continuing his Seroquel, and Zoloft.  Will follow closely.   [name], MD [facility] [place]    Labs   CBC: Recent Labs  Lab 02/25/21 2204 02/26/21 0630 02/27/21 0026  WBC 29.4*  --  20.7*  NEUTROABS 26.0*  --  17.2*  HGB 13.7 11.6* 11.6*  HCT 40.2 34.0* 34.1*  MCV 90.1  --  91.9  PLT 473*  --  378     Basic Metabolic Panel: Recent Labs  Lab 02/25/21 2204 02/26/21 0630 02/27/21 0026  NA 138 139 137  K 3.8 3.6 3.8   CL 99  --  102  CO2 26  --  27  GLUCOSE 149*  --  121*  BUN 11  --  6  CREATININE 0.71  --  0.52*  CALCIUM 9.3  --  9.0  MG  --   --  1.9    GFR: Estimated Creatinine Clearance: 103.9 mL/min (A) (by C-G formula based on SCr of 0.52 mg/dL (L)). Recent Labs  Lab 02/25/21 2204 02/26/21 0241 02/26/21 0403 02/26/21 1215 02/27/21 0026  PROCALCITON  --   --   --   --  <0.10  WBC 29.4*  --   --   --  20.7*  LATICACIDVEN  --  1.6 3.7* 1.1  --      Liver Function Tests: Recent Labs  Lab 02/25/21 2204 02/27/21 0026  AST 21 21  ALT 28 19  ALKPHOS 103 75  BILITOT 1.1 1.0  PROT 7.1 5.9*  ALBUMIN 3.6 3.0*    Recent Labs  Lab 02/25/21 2204  LIPASE 18    No results for input(s): AMMONIA in the last 168 hours.  ABG    Component Value Date/Time   PHART 7.458 (H) 02/26/2021 0630   PCO2ART 40.5 02/26/2021 0630   PO2ART 79 (L) 02/26/2021 0630   HCO3 28.7 (H) 02/26/2021  0630   TCO2 30 02/26/2021 0630   ACIDBASEDEF 1.3 01/18/2021 1504   O2SAT 96.0 02/26/2021 0630      Coagulation Profile: Recent Labs  Lab 02/25/21 2204 02/27/21 0026  INR 1.1 1.1     Cardiac Enzymes: No results for input(s): CKTOTAL, CKMB, CKMBINDEX, TROPONINI in the last 168 hours.  HbA1C: No results found for: HGBA1C  CBG: Recent Labs  Lab 02/26/21 1101 02/26/21 1700 02/26/21 2041  GLUCAP 135* 147* 135*

## 2021-02-27 NOTE — Progress Notes (Signed)
Referring Physician(s): Dr Lorraine Lax  Supervising Physician: Sandi Mariscal  Patient Status:  [facility] - In-pt  Chief Complaint:  Rt loculated hemothorax  Subjective:  Rt apical Chest tube placement in IR 6/20  Recently status post right upper lobe lobectomy with node dissection 5/16 for T4, N0, stage IIIa adenocarcinoma  In bed Pleasant - but groggy and painful CT intact; clean and dry Op bloody 750 cc in pleurvac  CXR today:  IMPRESSION: Small residual loculated hydropneumothorax at RIGHT apex post chest tube insertion. BILATERAL mid to lower lung pulmonary infiltrates, slightly increased.   Allergies: Oxycodone hcl and Bupropion  Medications: Prior to Admission medications   Medication Sig Start Date End Date Taking? Authorizing Provider  acetaminophen (TYLENOL) 500 MG tablet Take 500 mg by mouth 2 (two) times daily as needed for moderate pain or headache.    [provider]  amLODipine (NORVASC) 10 MG tablet TAKE 1 TABLET BY MOUTH EVERY DAY 02/19/21   [name], MD  Ascorbic Acid (VITAMIN C) 1000 MG tablet Take 1,000 mg by mouth daily.    [provider]  aspirin EC 81 MG tablet Take 81 mg by mouth daily. Swallow whole.    [provider]  Coenzyme Q10 (COQ10) 100 MG CAPS Take 100 mg by mouth daily.    [provider]  gabapentin (NEURONTIN) 300 MG capsule Take 1 capsule (300 mg total) by mouth 3 (three) times daily. 02/13/21   [name], MD  HAWTHORNE BERRY PO Take 1 tablet by mouth daily.    [provider]  irbesartan (AVAPRO) 300 MG tablet Take 300 mg by mouth daily.    [provider]  latanoprost (XALATAN) 0.005 % ophthalmic solution Place 1 drop into both eyes at bedtime.    [provider]  Menthol, Topical Analgesic, (BIOFREEZE EX) Apply 1 application topically daily as needed (pain).    [provider]  OVER THE COUNTER MEDICATION Take 1 tablet by mouth 3 (three) times  daily. Jointprin otc supplement    [provider]  QUEtiapine (SEROQUEL) 50 MG tablet Take 50 mg by mouth at bedtime. 11/06/20   [provider]  Red Yeast Rice Extract (RED YEAST RICE PO) Take 2 tablets by mouth daily.    [provider]  sertraline (ZOLOFT) 100 MG tablet Take 100 mg by mouth daily. 10/29/20   [provider]  traMADol (ULTRAM) 50 MG tablet Take 2 tablets (100 mg total) by mouth every 6 (six) hours as needed for up to 7 days (mild pain). 02/24/21 03/03/21  [name], MD  TURMERIC PO Take 1 tablet by mouth daily.    [provider]  vitamin B-12 (CYANOCOBALAMIN) 1000 MCG tablet Take 1,000 mcg by mouth daily.    [provider]     Vital Signs: BP (!) 149/83   Pulse (!) 101   Temp 98.6 F (37 C) (Oral)   Resp 19   Ht 5\' 10"  (1.778 m)   Wt 185 lb (83.9 kg)   SpO2 91%   BMI 26.54 kg/m   Physical Exam Skin:    General: Skin is warm.     Comments: Skin site is clean and dry NT no bleeding OP in vac 750 cc Bloody fluid + air leak       Imaging: DG Chest 2 View  Result Date: 02/25/2021 CLINICAL DATA:  History of right partial lobectomy with hemoptysis and increasing shortness of breath EXAM: CHEST - 2  VIEW COMPARISON:  02/20/2021 FINDINGS: Cardiac shadow is stable. Left lung remains clear. Right lung demonstrates right-sided pleural effusion and masslike density superiorly with postsurgical changes. This represents pleural fluid loculated from prior resection and stable in appearance. Some increasing parenchymal density is noted throughout the aerated lung which may represent hemorrhage given the history of hemoptysis. No new mass lesion is seen. IMPRESSION: Stable masslike density in the right apex consisting of loculated fluid following recent surgery. Increasing interstitial opacity within the right lung which may represent some hemorrhage given the hemoptysis. Small basilar right sided effusion.  Electronically Signed   By: Inez Catalina M.D.   On: 02/25/2021 22:27   CT Angio Chest PE W and/or Wo Contrast  Result Date: 02/26/2021 CLINICAL DATA:  hx of R partial lobectomy, has been coughing up blood since about noon, increased shortness of breath, denies home 02, now on 3L [place]. Pulmonary embolus suspected. VATS and thoracotomy surgery 01/22/2021 EXAM: CT ANGIOGRAPHY CHEST WITH CONTRAST TECHNIQUE: Multidetector CT imaging of the chest was performed using the standard protocol during bolus administration of intravenous contrast. Multiplanar CT image reconstructions and MIPs were obtained to evaluate the vascular anatomy. CONTRAST:  62mL OMNIPAQUE IOHEXOL 350 MG/ML SOLN COMPARISON:  CT chest 02/16/2021 FINDINGS: Cardiovascular: Satisfactory opacification of the pulmonary arteries to the segmental level. No evidence of pulmonary embolism. Normal heart size. No pericardial effusion. The thoracic aorta is normal in caliber. Coronary artery calcifications are noted. Mediastinum/Nodes: No enlarged mediastinal, hilar, or axillary lymph nodes. Thyroid gland, trachea, and esophagus demonstrate no significant findings. Lungs/Pleura: Surgical changes related to a partial right lobectomy of the right upper lobe with associated loculated right apical moderate-sized pleural effusion. Persistent trace basilar pleural effusion. Diffuse, right greater than left as well as lower lobe greater than upper lobe, peribronchovascular ground-glass and consolidative opacities. Some peripheral airspace opacities are also noted. Upper Abdomen: No acute abnormality. Musculoskeletal: No chest wall abnormality. No suspicious lytic or blastic osseous lesions. No acute displaced fracture. Redemonstration of subacute right comminuted fifth as well as right nondisplaced sixth rib fractures that are broken in 2 separate places. Multilevel degenerative changes of the spine. Review of the MIP images confirms the above findings. IMPRESSION: 1. No  pulmonary embolus. 2. Diffuse pulmonary findings findings suggestive of alveolar hemorrhage versus infection/inflammation versus pulmonary edema. 3. Similar-appearing loculated moderate sized right apical pleural effusion with limited evaluation due to timing of intravenous contrast. Persistent trace right basilar pleural effusion. 4. Subacute right comminuted fifth as well as right nondisplaced sixth rib fractures that are broken in 2 separate places consistent with history of thoracotomy. 5. Coronary artery calcifications. Electronically Signed   By: Iven Finn M.D.   On: 02/26/2021 04:59   DG Chest Port 1 View  Result Date: 02/27/2021 CLINICAL DATA:  New hemoptysis, fatigue, worsening shortness of breath, hypoxia EXAM: PORTABLE CHEST 1 VIEW COMPARISON:  Portable exam 0835 hours compared to 02/25/2021 FINDINGS: Pigtail thoracostomy tube at RIGHT apex with decrease in loculated pleural effusion. Small loculated pneumothorax at RIGHT apex. Stable heart size and mediastinal contours. BILATERAL pulmonary infiltrates in the mid to lower lungs, increased on LEFT since previous exam. No acute osseous findings. IMPRESSION: Small residual loculated hydropneumothorax at RIGHT apex post chest tube insertion. BILATERAL mid to lower lung pulmonary infiltrates, slightly increased. Electronically Signed   By: Lavonia Dana M.D.   On: 02/27/2021 11:20   CT IMAGE GUIDED DRAINAGE BY PERCUTANEOUS CATHETER  Result Date: 02/26/2021 INDICATION: 60 year old male with a history recent surgery, presentation  with hemoptysis, and loculated fluid of the surgery site. He has been referred for CT-guided chest tube placement EXAM: CT-GUIDED CHEST TUBE MEDICATIONS: The patient is currently admitted to the [facility] and receiving intravenous antibiotics. The antibiotics were administered within an appropriate time frame prior to the initiation of the procedure. ANESTHESIA/SEDATION: 1.0 mg IV Versed 25 mcg IV Fentanyl Moderate Sedation  Time:  16 minutes The patient was continuously monitored during the procedure by the interventional radiology nurse under my direct supervision. COMPLICATIONS: None TECHNIQUE: Informed written consent was obtained from the patient after a thorough discussion of the procedural risks, benefits and alternatives. All questions were addressed. Maximal Sterile Barrier Technique was utilized including caps, mask, sterile gowns, sterile gloves, sterile drape, hand hygiene and skin antiseptic. A timeout was performed prior to the initiation of the procedure. PROCEDURE: Patient was positioned in the supine position on the CT gantry table and a scout CT of the chest was performed for planning purposes. Once angle of approach was determined, the skin and subcutaneous tissues this scan was prepped and draped in the usual sterile fashion, and a sterile drape was applied covering the operative field. A sterile gown and sterile gloves were used for the procedure. Local anesthesia was provided with 1% Lidocaine. The skin and subcutaneous tissues were infiltrated 1% lidocaine for local anesthesia, and a small stab incision was made with an 11 blade scalpel. Using CT guidance, a 18 gauge trocar needle was advanced into the pleural space of the right apex. After confirmation of the tip, modified Seldinger technique was used to place a 10 French pigtail drainage catheter. Approximately 60 cc of reddish thin fluid aspirated. Culture was sent to the lab for analysis. Catheter was then attached to water seal chamber and suction was applied confirming a operational chest tube. Retention suture was placed.  Sterile dressing was placed. Patient tolerated the procedure well and remained hemodynamically stable throughout. No complications were encountered and no significant blood loss was encounter FINDINGS: The scout CT demonstrates essentially a similar distribution and volume of confluent ground-glass opacity of the right lower lobe, right  middle lobe, and left lower lobe. No significant fluid identified within the airways. Similar appearance of the loculated fluid collection within the surgical site at the right apex. After placement of the chest tube approximately 60 cc of thin reddish fluid aspirated which appears to be serosanguineous. No frank abscess. Culture was sent. IMPRESSION: Status post CT-guided right apical chest tube into loculated fluid revealing serosanguineous fluid. Culture was sent. Signed, Dulcy Fanny. Dellia Nims, RPVI [facility] Electronically Signed   By: Corrie Mckusick D.O.   On: 02/26/2021 17:09    Labs:  CBC: Recent Labs    01/23/21 0136 01/24/21 0142 02/25/21 2204 02/26/21 0630 02/27/21 0026  WBC 14.1* 9.8 29.4*  --  20.7*  HGB 11.5* 11.0* 13.7 11.6* 11.6*  HCT 35.2* 34.1* 40.2 34.0* 34.1*  PLT 281 271 473*  --  378    COAGS: Recent Labs    01/18/21 1500 02/25/21 2204 02/27/21 0026  INR 1.0 1.1 1.1  APTT 28  --   --     BMP: Recent Labs    01/23/21 0136 01/24/21 0142 02/25/21 2204 02/26/21 0630 02/27/21 0026  NA 136 135 138 139 137  K 3.8 3.5 3.8 3.6 3.8  CL 100 100 99  --  102  CO2 27 28 26   --  27  GLUCOSE 135* 121* 149*  --  121*  BUN 10 15 11   --  6  CALCIUM 8.4* 8.6* 9.3  --  9.0  CREATININE 0.66 0.61 0.71  --  0.52*  GFRNONAA >60 >60 >60  --  >60    LIVER FUNCTION TESTS: Recent Labs    01/18/21 1500 01/24/21 0142 02/25/21 2204 02/27/21 0026  BILITOT 0.5 0.4 1.1 1.0  AST 18 24 21 21   ALT 22 21 28 19   ALKPHOS 90 68 103 75  PROT 7.1 5.9* 7.1 5.9*  ALBUMIN 3.3* 2.7* 3.6 3.0*    Assessment and Plan:  Rt chest tube placed in IR 6/20 Rt loculated pleural effusion/hemothorax Resting Painful 750 cc in pleurvac;/ bloody  +airleak Will follow  Electronically Signed: Lavonia Drafts, PA-C 02/27/2021, 1:23 PM   I spent a total of 15 Minutes at the the patient's bedside AND on the patient's [facility]  floor or unit, greater than 50% of which was counseling/coordinating care for right chest tube placement

## 2021-02-27 NOTE — Progress Notes (Signed)
Coughed out bright red blood in minimal amount. Md aware with no order. Continue to monitor.

## 2021-02-28 ENCOUNTER — Inpatient Hospital Stay (HOSPITAL_COMMUNITY): Payer: 59

## 2021-02-28 ENCOUNTER — Encounter (HOSPITAL_COMMUNITY)
Admission: EM | Disposition: A | Discharge status: Home / Self Care | Payer: Self-pay | Source: Home / Self Care | Attending: Internal Medicine

## 2021-02-28 DIAGNOSIS — R042 Hemoptysis: Secondary | ICD-10-CM | POA: Diagnosis not present

## 2021-02-28 DIAGNOSIS — J690 Pneumonitis due to inhalation of food and vomit: Secondary | ICD-10-CM

## 2021-02-28 DIAGNOSIS — J9601 Acute respiratory failure with hypoxia: Secondary | ICD-10-CM | POA: Diagnosis not present

## 2021-02-28 DIAGNOSIS — J90 Pleural effusion, not elsewhere classified: Secondary | ICD-10-CM

## 2021-02-28 DIAGNOSIS — C3491 Malignant neoplasm of unspecified part of right bronchus or lung: Secondary | ICD-10-CM | POA: Diagnosis not present

## 2021-02-28 DIAGNOSIS — Z9689 Presence of other specified functional implants: Secondary | ICD-10-CM | POA: Diagnosis not present

## 2021-02-28 LAB — BASIC METABOLIC PANEL
Anion gap: 11 (ref 5–15)
BUN: 8 mg/dL (ref 6–20)
CO2: 23 mmol/L (ref 22–32)
Calcium: 8.6 mg/dL — ABNORMAL LOW (ref 8.9–10.3)
Chloride: 100 mmol/L (ref 98–111)
Creatinine, Ser: 0.56 mg/dL — ABNORMAL LOW (ref 0.61–1.24)
GFR, Estimated: >60 mL/min (ref >60)
Glucose, Bld: 116 mg/dL — ABNORMAL HIGH (ref 70–99)
Potassium: 3.4 mmol/L — ABNORMAL LOW (ref 3.5–5.1)
Sodium: 134 mmol/L — ABNORMAL LOW (ref 135–145)

## 2021-02-28 LAB — MAGNESIUM: Magnesium: 1.9 mg/dL (ref 1.7–2.4)

## 2021-02-28 LAB — CBC
HCT: 34.4 % — ABNORMAL LOW (ref 39.0–52.0)
Hemoglobin: 11.2 g/dL — ABNORMAL LOW (ref 13.0–17.0)
MCH: 30 pg (ref 26.0–34.0)
MCHC: 32.6 g/dL (ref 30.0–36.0)
MCV: 92.2 fL (ref 80.0–100.0)
Platelets: 345 10*3/uL (ref 150–400)
RBC: 3.73 MIL/uL — ABNORMAL LOW (ref 4.22–5.81)
RDW: 14.8 % (ref 11.5–15.5)
WBC: 17.9 10*3/uL — ABNORMAL HIGH (ref 4.0–10.5)
nRBC: 0 % (ref 0.0–0.2)

## 2021-02-28 LAB — SURGICAL PCR SCREEN
MRSA, PCR: NEGATIVE
Staphylococcus aureus: NEGATIVE

## 2021-02-28 LAB — PHOSPHORUS: Phosphorus: 2.8 mg/dL (ref 2.5–4.6)

## 2021-02-28 IMAGING — DX DG CHEST 1V PORT
1 series · 1 of 1 positions shown · non-contrast
Comparison: [DATE].

CLINICAL DATA: Chest tube.  Shortness of breath.

EXAM:
PORTABLE CHEST 1 VIEW

[chest ap]
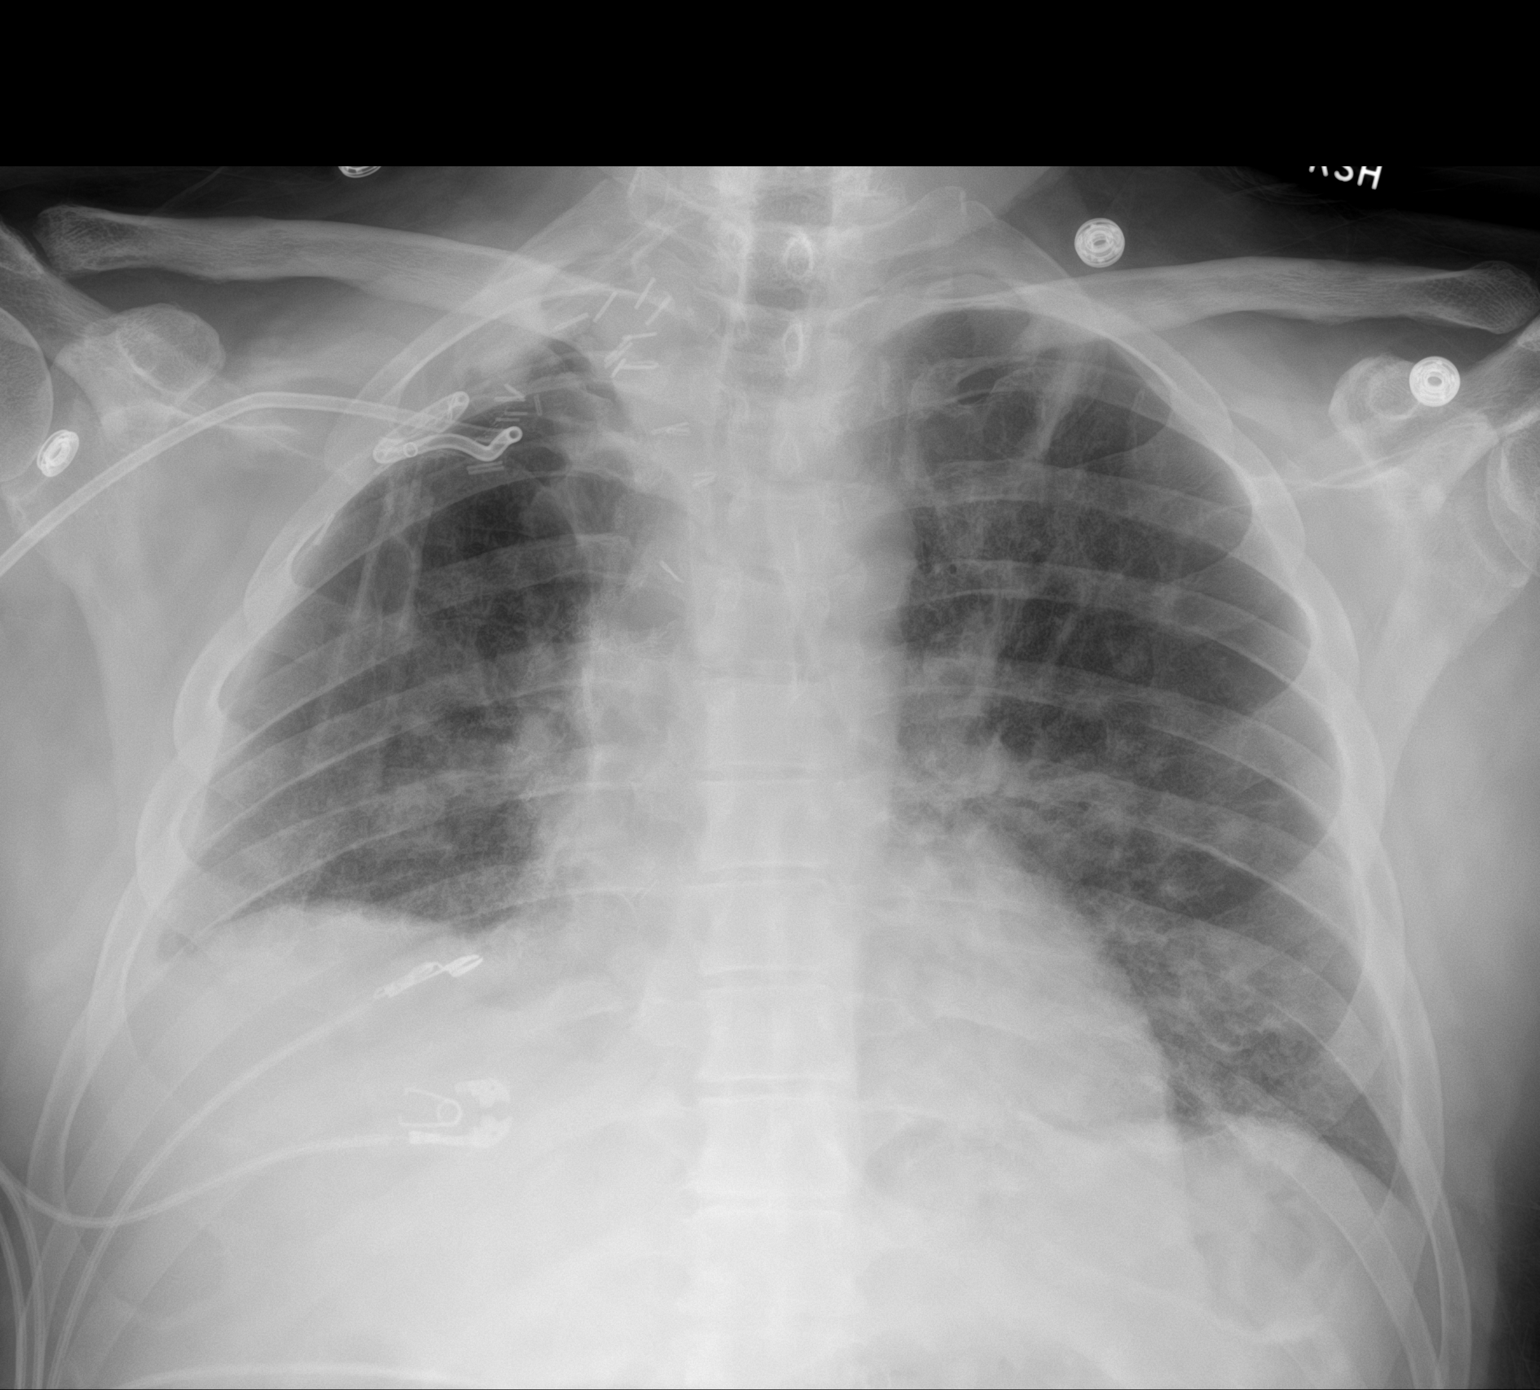

[1 of 1 positions shown; findings below may reference images not displayed]

FINDINGS: Surgical clips right chest. Right chest tube in stable position over
the right apex. Stable right apical pleural thickening and tiny
pneumothorax noted. Stable right lateral pleural thickening.
Bilateral pulmonary infiltrates/edema, improved from prior exam. No
acute bony abnormality.
IMPRESSION: 1. Surgical clips right chest. Right chest tube in stable position
over the right apex. Stable right apical pleural thickening and tiny
pneumothorax noted. Stable right lateral pleural thickening.

2.  Bilateral pulmonary infiltrates/edema, improved from prior exam.

## 2021-02-28 SURGERY — BRONCHOSCOPY, VIDEO-ASSISTED
Anesthesia: General

## 2021-02-28 MED ORDER — POTASSIUM CHLORIDE 10 MEQ/100ML IV SOLN
10.0000 meq | INTRAVENOUS | Status: DC
  Administered 2021-02-28: 10 meq via INTRAVENOUS
  Filled 2021-02-28: qty 100

## 2021-02-28 MED ORDER — IPRATROPIUM-ALBUTEROL 0.5-2.5 (3) MG/3ML IN SOLN
3.0000 mL | Freq: Four times a day (QID) | RESPIRATORY_TRACT | Status: DC
  Administered 2021-02-28 – 2021-03-02 (×9): 3 mL via RESPIRATORY_TRACT
  Filled 2021-02-28 (×9): qty 3

## 2021-02-28 MED ORDER — ALBUTEROL SULFATE (2.5 MG/3ML) 0.083% IN NEBU: 2.5000 mg | INHALATION_SOLUTION | Freq: Four times a day (QID) | RESPIRATORY_TRACT | Status: DC | PRN

## 2021-02-28 MED ORDER — POTASSIUM CHLORIDE CRYS ER 20 MEQ PO TBCR
40.0000 meq | EXTENDED_RELEASE_TABLET | Freq: Once | ORAL | Status: AC
  Administered 2021-02-28: 40 meq via ORAL
  Filled 2021-02-28: qty 2

## 2021-02-28 NOTE — Progress Notes (Signed)
Complained of burning on iv site when potassium iv started despite rate decreased to 50 ml/hr and cold compress applied. MD aware, dose changed to po.

## 2021-02-28 NOTE — Progress Notes (Signed)
Procedure(s) (LRB): VIDEO BRONCHOSCOPY (N/A) Subjective: Still short of breath, on HFNC, minimal ongoing hemoptysis  Objective: Vital signs in last 24 hours: Temp:  [98.9 F (37.2 C)-99.5 F (37.5 C)] 98.9 F (37.2 C) (06/22 0759) Pulse Rate:  [94-107] 95 (06/22 0759) Cardiac Rhythm: Normal sinus rhythm (06/22 0704) Resp:  [19-27] 23 (06/22 0759) BP: (146-164)/(82-91) 157/83 (06/22 0759) SpO2:  [91 %-96 %] 92 % (06/22 0759) FiO2 (%):  [80 %] 80 % (06/22 0400)  Hemodynamic parameters for last 24 hours:    Intake/Output from previous day: 06/21 0701 - 06/22 0700 In: 850 [IV Piggyback:850] Out: 700 [Urine:350; Chest Tube:350] Intake/Output this shift: Total I/O In: -  Out: 400 [Urine:400]  General appearance: alert, cooperative, and increased WOB Neurologic: intact Lungs: rhonchi bilaterally No air leak  Lab Results: Recent Labs    02/27/21 0026 02/28/21 0021  WBC 20.7* 17.9*  HGB 11.6* 11.2*  HCT 34.1* 34.4*  PLT 378 345   BMET:  Recent Labs    02/27/21 0026 02/28/21 0021  NA 137 134*  K 3.8 3.4*  CL 102 100  CO2 27 23  GLUCOSE 121* 116*  BUN 6 8  CREATININE 0.52* 0.56*  CALCIUM 9.0 8.6*    PT/INR:  Recent Labs    02/27/21 0026  LABPROT 14.6  INR 1.1   ABG    Component Value Date/Time   PHART 7.458 (H) 02/26/2021 0630   HCO3 28.7 (H) 02/26/2021 0630   TCO2 30 02/26/2021 0630   ACIDBASEDEF 1.3 01/18/2021 1504   O2SAT 96.0 02/26/2021 0630   CBG (last 3)  Recent Labs    02/26/21 1101 02/26/21 1700 02/26/21 2041  GLUCAP 135* 147* 135*    Assessment/Plan: S/P Procedure(s) (LRB): VIDEO BRONCHOSCOPY (N/A) - continues with high O2 demand CXR shows apical collection nearly completely drained- leave tube in place Cultures of fluid are negative so far Pneumonia/ alveolar hemorrhage- on Vanco and Maxipime, WBC down slightly Given high O2 demand will hold off on bronchoscopy today    LOS: 2 days    [name] 02/28/2021

## 2021-02-28 NOTE — Progress Notes (Signed)
NAME:  Christopher Carter, MRN:  [id number], DOB:  May 26, 1961, LOS: 2 ADMISSION DATE:  02/25/2021, CONSULTATION DATE:  6/20 REFERRING MD:  Dwyane Dee, CHIEF COMPLAINT:  respiratory failure and hemoptysis    History of Present Illness:  This is a 60 year old non-smoker.  Recently status post right upper lobe lobectomy with node dissection 5/16 for T4, N0, stage IIIa adenocarcinoma.  Surgical notes stated there was still residual tumor posterior near spine which was unable to be resected.  He is yet to undergo chemo or radiation.  Had been recovering slowly with expected postoperative pain.  Started to note decreased energy, more fatigue, overall malaise sometime around 6/15.  Had been seen the day prior by cardiothoracic's who noted loculated right upper lobe collection from surgical site for which plan CT-guided aspiration was planned for 6/21.  On 6/19 he continued to feel weak and fatigued, around midday he began to cough up blood.  Noting about a tablespoon at a time, reporting it is being bright red, and at its worst up to 6 times an hour.  At sometimes the cough would be so intense that it would make him nauseated and he would also vomit because of this, and worsening shortness of breath he reported to the emergency room around 8 PM on 6/19 .  In the ER he was found to be hypoxic requiring titration of oxygen up to 100% on heated high flow.  Initial CT imaging on 6/20 was negative for pulmonary embolus, there is a loculated moderate size right apical pleural fluid collection, diffuse bilateral airspace disease, and subacute right fifth and nondisplaced sixth rib fractures secondary to prior "thoracotomy.  He was admitted by the internal medicine service, cardiothoracic surgery is already consulted, because of the supplemental oxygen need pulmonary asked to evaluate  Pertinent  Medical History  Hypertension, depression.  Right upper lobe lung mass status post thoracotomy and right upper lobectomy with node  dissection 5/16.  Mass noted to be invading the chest wall with positive margins posteriorly near the spine.  He is T4, N0, stage IIIa adenocarcinoma.  Surgical notes document residual tumor posteriorly near the spine which was unable to be resected.  He has yet to undergo adjuvant radiation or chemo Last seen in cardiothoracic clinic on 6/17 at which time imaging noted to see a new loculated right upper lobe fluid collection and there was plan to proceed with CT-guided aspiration Significant Hospital Events: Including procedures, antibiotic start and stop dates in addition to other pertinent events   6/19 admitted with new hemoptysis,, fatigue, and worsening shortness of breath.  Found to be hypoxic on arrival requiring titration of supplemental oxygen up to heated high flow.  Started on azithromycin, ceftriaxone, and vancomycin  6/20   CT imaging obtained showing previously identified loculated right apical pleural effusion, negative for pulmonary emboli.  There is diffuse alveolar airspace disease.  Pulmonary asked to evaluate.  Interventional radiology consulted 6/20 IR placed anterior chestube into loculated effusion  Interim History / Subjective:  Had one episode of hemoptysis yesterday - coughed into napkin, was flecks of bright red blood, no sputum.  Has chronic back pain which is exacerbated this hospital stay. Difficulty sitting up at side of the bed due to this pain.   Objective   Blood pressure (!) 157/83, pulse 94, temperature 98.9 F (37.2 C), temperature source Oral, resp. rate (!) 26, height 5\' 10"  (1.778 m), weight 83.9 kg, SpO2 92 %.    FiO2 (%):  [70 %-80 %]  70 %   Intake/Output Summary (Last 24 hours) at 02/28/2021 0843 Last data filed at 02/28/2021 9485 Gross per 24 hour  Intake 850 ml  Output 1100 ml  Net -250 ml   Filed Weights   02/26/21 0208  Weight: 83.9 kg    Examination: General: acutely ill appearing HENT: mmm Lungs: increased RR, coarse bilateral breath  sounds Cardiovascular: tachycardic, regular Abdomen: Soft not tender no organomegaly Extremities: Warm dry with brisk capillary refill Neuro: Awake oriented no focal deficits  Labs/imaging that I havepersonally reviewed  (right click and "Reselect all SmartList Selections" daily)  Reviewed -  Procalcitonin low Culture from pleural fluid negative thus far WBC - 17.9, downtrending Chest xray reviewed this morning - pigtail in good position, 350 bloody drainage in last 24 hours.   Resolved Hospital Problem list     Assessment & Plan:  Acute hypoxic respiratory failure (POA) in the setting of diffuse pulmonary infiltrates with associated hemoptysis, and right upper lobe loculated pleural effusion -s/p chest tube placement, RUL anterior bloody fluid collection -His degree of hypoxia is a contraindication to bronchoscopy  unless requires intubation Plan - chest xray today shows stable to slightly improved airspace opacities. Suspect inflammatory response from aspiration after emesis, in the setting of being post-operative RUL lobectomy and with active malignancy. He has debris in the right middle lobe bronchus and infiltrates are dependent, bilateral, right greater than left, and airway centric. I think likely this will just take some time and discussed this with family at the bedside. Degree of hypoxemia suggests significant V/Q mismatch made worse by his positioning in the supine position. - Continue vanc and cefepime for now - Continue high flow supplemental oxygen, titrate down to goal saturations over 90%.  - discussed with Dr. Roxan Hockey, given hemoptysis will hold on intrapleural fibrinolytics.  - bronchodilators seem to be helpful for him - will schedule duoneb and make available albuterol prn.   Severe sepsis secondary to above Plan IV hydration Continue antibiotics as above  Stage IIIa adenocarcinoma of the lung status post right upper lobe lobectomy, yet to undergo adjunctive  therapy Plan Follow-up at his outpatient  Will continue to follow. AM chest xray tomorrow.   [name], MD [facility]    Labs   CBC: Recent Labs  Lab 02/25/21 2204 02/26/21 0630 02/27/21 0026 02/28/21 0021  WBC 29.4*  --  20.7* 17.9*  NEUTROABS 26.0*  --  17.2*  --   HGB 13.7 11.6* 11.6* 11.2*  HCT 40.2 34.0* 34.1* 34.4*  MCV 90.1  --  91.9 92.2  PLT 473*  --  378 462    Basic Metabolic Panel: Recent Labs  Lab 02/25/21 2204 02/26/21 0630 02/27/21 0026 02/28/21 0021  NA 138 139 137 134*  K 3.8 3.6 3.8 3.4*  CL 99  --  102 100  CO2 26  --  27 23  GLUCOSE 149*  --  121* 116*  BUN 11  --  6 8  CREATININE 0.71  --  0.52* 0.56*  CALCIUM 9.3  --  9.0 8.6*  MG  --   --  1.9 1.9  PHOS  --   --   --  2.8   GFR: Estimated Creatinine Clearance: 103.9 mL/min (A) (by C-G formula based on SCr of 0.56 mg/dL (L)). Recent Labs  Lab 02/25/21 2204 02/26/21 0241 02/26/21 0403 02/26/21 1215 02/27/21 0026 02/28/21 0021  PROCALCITON  --   --   --   --  <  0.10  --   WBC 29.4*  --   --   --  20.7* 17.9*  LATICACIDVEN  --  1.6 3.7* 1.1  --   --     Liver Function Tests: Recent Labs  Lab 02/25/21 2204 02/27/21 0026  AST 21 21  ALT 28 19  ALKPHOS 103 75  BILITOT 1.1 1.0  PROT 7.1 5.9*  ALBUMIN 3.6 3.0*   Recent Labs  Lab 02/25/21 2204  LIPASE 18   No results for input(s): AMMONIA in the last 168 hours.  ABG    Component Value Date/Time   PHART 7.458 (H) 02/26/2021 0630   PCO2ART 40.5 02/26/2021 0630   PO2ART 79 (L) 02/26/2021 0630   HCO3 28.7 (H) 02/26/2021 0630   TCO2 30 02/26/2021 0630   ACIDBASEDEF 1.3 01/18/2021 1504   O2SAT 96.0 02/26/2021 0630     Coagulation Profile: Recent Labs  Lab 02/25/21 2204 02/27/21 0026  INR 1.1 1.1    Cardiac Enzymes: No results for input(s): CKTOTAL, CKMB, CKMBINDEX, TROPONINI in the last 168 hours.  HbA1C: No results found for: HGBA1C  CBG: Recent Labs  Lab  02/26/21 1101 02/26/21 1700 02/26/21 2041  GLUCAP 135* 147* 135*

## 2021-02-28 NOTE — Plan of Care (Signed)
  Problem: Education: Goal: Knowledge of General Education information will improve Description: Including pain rating scale, medication(s)/side effects and non-pharmacologic comfort measures Outcome: Progressing   Problem: Clinical Measurements: Goal: Ability to maintain clinical measurements within normal limits will improve Outcome: Progressing   Problem: Clinical Measurements: Goal: Diagnostic test results will improve Outcome: Progressing   Problem: Clinical Measurements: Goal: Respiratory complications will improve Outcome: Progressing   Problem: Activity: Goal: Risk for activity intolerance will decrease Outcome: Progressing   Problem: Nutrition: Goal: Adequate nutrition will be maintained Outcome: Progressing   Problem: Pain Managment: Goal: General experience of comfort will improve Outcome: Progressing   Problem: Skin Integrity: Goal: Risk for impaired skin integrity will decrease Outcome: Progressing

## 2021-02-28 NOTE — Progress Notes (Signed)
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 Roxan Hockey and was discharged without any complication on 8/41 presented to the emergency department with complaints of severe cough, bright red hemoptysis, shortness of breath.  On presentation he was found to have leukocytosis in the range of 29,000, lactic acid was elevated.  Chest CT revealed diffuse infiltrates concerning for alveolar hemorrhage with possible superimposed infectious process/pulmonary edema.  Patient was started on broad-spectrum antibiotics.  PCCM/IR consulted and he underwent right-sided chest tube placement for hemothorax.  Cardiothoracic surgery also consulting.  Patient currently being managed for acute hypoxic respiratory failure requiring high flow oxygen, possible superimposed pneumonia.  Plan is to do bronchoscopy after improvement in the oxygen requirement.  Assessment & Plan:   Principal Problem:   Acute respiratory failure with hypoxia (HCC) Active Problems:   Essential hypertension   Pulmonary alveolar hemorrhage   Adenocarcinoma of right lung (HCC)   Pneumonia of right lung due to infectious organism   Sepsis due to pneumonia ([place])   Major depressive disorder   Acute hypoxic respiratory failure in the setting of right upper lobe loculated pleural effusion/hemothorax: Presented with hemoptysis, cough, shortness of breath requiring supplemental oxygen.  His hypoxia worsened during the hospitalization and currently he is on high flow oxygen.  Acute hypoxic respite failure is multifactorial secondary to progressive  alveolar hemorrhage, possible superimposed pneumonia.  Underwent pigtail chest tube placement by IR on 5/21.  Continue supportive care, supplemental oxygen, cough medicines.  PCCM, cardiothoracic surgery following.  Plan is to do bronchoscopy when oxygen requirement is better.  Superimposed pneumonia/severe sepsis: Presented with tachycardia, tachypnea, leukocytosis, elevated lactic acid level, chest infiltrates.  Continue broad antibiotics with vancomycin and cefepime.  Follow-up cultures.  Currently blood pressure stable, afebrile. Leukocytosis is improving  Lactic acidosis: Resolved with IV fluids  Hypertension: Currently blood pressure stable.  On Avapro, amlodipine  History of depression: Continue home medications  Adenocarcinoma of the right lung: Diagnosed in April 2022, follows with Dr. Julien Nordmann.  Status post recent VATS with right upper lobe lobectomy and chest wall dissection by cardiothoracic surgery, Dr. Koleen Nimrod.  Needs outpatient follow-up.  Hypokalemia: Supplemented with potassium.         DVT prophylaxis:SCD Code Status: Full Family Communication: None at bedside Status is: Inpatient  Remains inpatient appropriate because:Inpatient level of care appropriate due to severity of illness  Dispo: The patient is from: Home              Anticipated d/c is to: Home              Patient currently is not medically stable to d/c.   Difficult to place patient No    Consultants: PCCM, IR, cardiothoracic surgery  Procedures: Chest tube placement  Antimicrobials:  Anti-infectives (From admission, onward)    Start     Dose/Rate Route Frequency Ordered Stop   02/27/21 0300  cefTRIAXone (ROCEPHIN) 2 g in sodium chloride 0.9 % 100 mL IVPB  Status:  Discontinued        2 g 200 mL/hr over 30 Minutes Intravenous Every 24 hours 02/26/21 0603 02/26/21 0938   02/27/21 0300  azithromycin (ZITHROMAX) 500 mg in sodium chloride 0.9 % 250 mL IVPB  Status:  Discontinued        500  mg 250 mL/hr over 60 Minutes Intravenous Every 24 hours 02/26/21 0603 02/27/21 0816   02/26/21 1200  ceFEPIme (MAXIPIME) 2 g in sodium chloride 0.9 % 100 mL IVPB        2 g 200 mL/hr over 30 Minutes Intravenous Every 8 hours 02/26/21 1016     02/26/21 1000  vancomycin (VANCOREADY) IVPB 1250 mg/250 mL        1,250 mg 166.7 mL/hr over 90 Minutes Intravenous Every 12 hours 02/26/21 0444     02/26/21 0215  vancomycin (VANCOREADY) IVPB 1500 mg/300 mL        1,500 mg 150 mL/hr over 120 Minutes Intravenous  Once 02/26/21 0203 02/26/21 0636   02/26/21 0215  cefTRIAXone (ROCEPHIN) 2 g in sodium chloride 0.9 % 100 mL IVPB        2 g 200 mL/hr over 30 Minutes Intravenous  Once 02/26/21 0203 02/26/21 0347   02/26/21 0215  azithromycin (ZITHROMAX) 500 mg in sodium chloride 0.9 % 250 mL IVPB        500 mg 250 mL/hr over 60 Minutes Intravenous  Once 02/26/21 0203 02/26/21 0444       Subjective:  Patient seen and examined the bedside this morning.  Hemodynamically stable during my evaluation.  Requiring high flow oxygen.  Complains of generalized body ache, some shortness of breath but not coughing.  Chest tube was draining sanguinous fluid.   Objective: Vitals:   02/28/21 0309 02/28/21 0358 02/28/21 0400 02/28/21 0759  BP: (!) 159/87 (!) 153/85 (!) 158/83 (!) 157/83  Pulse: (!) 101 (!) 104 99 95  Resp: (!) 25 (!) 22 (!) 21 (!) 23  Temp:  99.5 F (37.5 C)  98.9 F (37.2 C)  TempSrc:  Oral  Oral  SpO2: 96% 94% 93% 92%  Weight:      Height:        Intake/Output Summary (Last 24 hours) at 02/28/2021 0818 Last data filed at 02/28/2021 2482 Gross per 24 hour  Intake 850 ml  Output 1100 ml  Net -250 ml   Filed Weights   02/26/21 0208  Weight: 83.9 kg    Examination:  General exam: appears weak,Not in distress,average built HEENT:PERRL,Oral mucosa moist, Ear/Nose normal on gross exam Respiratory system: Mild diminished air entry on the right side, no wheezes or crackles, chest tube on  the right upper chest draining sanguinous fluid Cardiovascular system: S1 & S2 heard, RRR. No JVD, murmurs, rubs, gallops or clicks. No pedal edema. Gastrointestinal system: Abdomen is nondistended, soft and nontender. No organomegaly or masses felt. Normal bowel sounds heard. Central nervous system: Alert and oriented. No focal neurological deficits. Extremities: No edema, no clubbing ,no cyanosis Skin: No rashes, lesions or ulcers,no icterus ,no pallor   Data Reviewed: I have personally reviewed following labs and imaging studies  CBC: Recent Labs  Lab 02/25/21 2204 02/26/21 0630 02/27/21 0026 02/28/21 0021  WBC 29.4*  --  20.7* 17.9*  NEUTROABS 26.0*  --  17.2*  --   HGB 13.7 11.6* 11.6* 11.2*  HCT 40.2 34.0* 34.1* 34.4*  MCV 90.1  --  91.9 92.2  PLT 473*  --  378 500   Basic Metabolic Panel: Recent Labs  Lab 02/25/21 2204 02/26/21 0630 02/27/21 0026 02/28/21 0021  NA 138 139 137 134*  K 3.8 3.6 3.8 3.4*  CL 99  --  102 100  CO2 26  --  27 23  GLUCOSE 149*  --  121* 116*  BUN 11  --  6 8  CREATININE 0.71  --  0.52* 0.56*  CALCIUM 9.3  --  9.0 8.6*  MG  --   --  1.9 1.9  PHOS  --   --   --  2.8   GFR: Estimated Creatinine Clearance: 103.9 mL/min (A) (by C-G formula based on SCr of 0.56 mg/dL (L)). Liver Function Tests: Recent Labs  Lab 02/25/21 2204 02/27/21 0026  AST 21 21  ALT 28 19  ALKPHOS 103 75  BILITOT 1.1 1.0  PROT 7.1 5.9*  ALBUMIN 3.6 3.0*   Recent Labs  Lab 02/25/21 2204  LIPASE 18   No results for input(s): AMMONIA in the last 168 hours. Coagulation Profile: Recent Labs  Lab 02/25/21 2204 02/27/21 0026  INR 1.1 1.1   Cardiac Enzymes: No results for input(s): CKTOTAL, CKMB, CKMBINDEX, TROPONINI in the last 168 hours. BNP (last 3 results) No results for input(s): PROBNP in the last 8760 hours. HbA1C: No results for input(s): HGBA1C in the last 72 hours. CBG: Recent Labs  Lab 02/26/21 1101 02/26/21 1700 02/26/21 2041  GLUCAP  135* 147* 135*   Lipid Profile: No results for input(s): CHOL, HDL, LDLCALC, TRIG, CHOLHDL, LDLDIRECT in the last 72 hours. Thyroid Function Tests: No results for input(s): TSH, T4TOTAL, FREET4, T3FREE, THYROIDAB in the last 72 hours. Anemia Panel: No results for input(s): VITAMINB12, FOLATE, FERRITIN, TIBC, IRON, RETICCTPCT in the last 72 hours. Sepsis Labs: Recent Labs  Lab 02/26/21 0241 02/26/21 0403 02/26/21 1215 02/27/21 0026  PROCALCITON  --   --   --  <0.10  LATICACIDVEN 1.6 3.7* 1.1  --     Recent Results (from the past 240 hour(s))  Resp Panel by RT-PCR (Flu A&B, Covid) Nasopharyngeal Swab     Status: None   Collection Time: 02/26/21  2:13 AM   Specimen: Nasopharyngeal Swab; Nasopharyngeal(NP) swabs in vial transport medium  Result Value Ref Range Status   SARS Coronavirus 2 by RT PCR NEGATIVE NEGATIVE Final    Comment: (NOTE) SARS-CoV-2 target nucleic acids are NOT DETECTED.  The SARS-CoV-2 RNA is generally detectable in upper respiratory specimens during the acute phase of infection. The lowest concentration of SARS-CoV-2 viral copies this assay can detect is 138 copies/mL. A negative result does not preclude SARS-Cov-2 infection and should not be used as the sole basis for treatment or other patient management decisions. A negative result may occur with  improper specimen collection/handling, submission of specimen other than nasopharyngeal swab, presence of viral mutation(s) within the areas targeted by this assay, and inadequate number of viral copies(<138 copies/mL). A negative result must be combined with clinical observations, patient history, and epidemiological information. The expected result is Negative.  Fact Sheet for Patients:  EntrepreneurPulse.com.au  Fact Sheet for Healthcare Providers:  IncredibleEmployment.be  This test is no t yet approved or cleared by the [place] FDA and  has been authorized for  detection and/or diagnosis of SARS-CoV-2 by FDA under an Emergency Use Authorization (EUA). This EUA will remain  in effect (meaning this test can be used) for the duration of the COVID-19 declaration under Section 564(b)(1) of the Act, 21 U.S.C.section 360bbb-3(b)(1), unless the authorization is terminated  or revoked sooner.       Influenza A by PCR NEGATIVE NEGATIVE Final   Influenza B by PCR NEGATIVE NEGATIVE Final    Comment: (NOTE) The  Xpert Xpress SARS-CoV-2/FLU/RSV plus assay is intended as an aid in the diagnosis of influenza from Nasopharyngeal swab specimens and should not be used as a sole basis for treatment. Nasal washings and aspirates are unacceptable for Xpert Xpress SARS-CoV-2/FLU/RSV testing.  Fact Sheet for Patients: EntrepreneurPulse.com.au  Fact Sheet for Healthcare Providers: IncredibleEmployment.be  This test is not yet approved or cleared by the [place] FDA and has been authorized for detection and/or diagnosis of SARS-CoV-2 by FDA under an Emergency Use Authorization (EUA). This EUA will remain in effect (meaning this test can be used) for the duration of the COVID-19 declaration under Section 564(b)(1) of the Act, 21 U.S.C. section 360bbb-3(b)(1), unless the authorization is terminated or revoked.  Performed at [facility], [place] [address] [place], [place] [address]   Blood Culture (routine x 2)     Status: None (Preliminary result)   Collection Time: 02/26/21  2:59 AM   Specimen: BLOOD RIGHT FOREARM  Result Value Ref Range Status   Specimen Description BLOOD RIGHT FOREARM  Final   Special Requests   Final    BOTTLES DRAWN AEROBIC AND ANAEROBIC Blood Culture adequate volume   Culture   Final    NO GROWTH 1 DAY Performed at [facility], [place] [address] [place], [place] [address]    Report Status PENDING  Incomplete  MRSA Next Gen by PCR, Nasal     Status: None   Collection Time: 02/26/21   6:04 AM   Specimen: Nasal Mucosa; Nasal Swab  Result Value Ref Range Status   MRSA by PCR Next Gen NOT DETECTED NOT DETECTED Final    Comment: (NOTE) The GeneXpert MRSA Assay (FDA approved for NASAL specimens only), is one component of a comprehensive MRSA colonization surveillance program. It is not intended to diagnose MRSA infection nor to guide or monitor treatment for MRSA infections. Test performance is not FDA approved in patients less than 23 years old. Performed at [facility], [place] [address] [place], [place] [address]   Blood Culture (routine x 2)     Status: None (Preliminary result)   Collection Time: 02/26/21 12:15 PM   Specimen: BLOOD RIGHT HAND  Result Value Ref Range Status   Specimen Description BLOOD RIGHT HAND  Final   Special Requests   Final    BOTTLES DRAWN AEROBIC AND ANAEROBIC Blood Culture adequate volume   Culture   Final    NO GROWTH < 24 HOURS Performed at [facility], [place] [address] [place], [place] [address]    Report Status PENDING  Incomplete  Aerobic/Anaerobic Culture w Gram Stain (surgical/deep wound)     Status: None (Preliminary result)   Collection Time: 02/26/21  4:00 PM   Specimen: Pleural Fluid  Result Value Ref Range Status   Specimen Description FLUID PLEURAL RIGHT  Final   Special Requests NONE  Final   Gram Stain   Final    RARE WBC PRESENT,BOTH PMN AND MONONUCLEAR NO ORGANISMS SEEN Performed at [facility], [place] [address], [place], [place] [address]    Culture PENDING  Incomplete   Report Status PENDING  Incomplete         Radiology Studies: DG Chest Port 1 View  Result Date: 02/27/2021 CLINICAL DATA:  New hemoptysis, fatigue, worsening shortness of breath, hypoxia EXAM: PORTABLE CHEST 1 VIEW COMPARISON:  Portable exam 0835 hours compared to 02/25/2021 FINDINGS: Pigtail thoracostomy tube at RIGHT apex with decrease in loculated pleural effusion. Small  loculated pneumothorax at RIGHT apex. Stable heart  size and mediastinal contours. BILATERAL pulmonary infiltrates in the mid to lower lungs, increased on LEFT since previous exam. No acute osseous findings. IMPRESSION: Small residual loculated hydropneumothorax at RIGHT apex post chest tube insertion. BILATERAL mid to lower lung pulmonary infiltrates, slightly increased. Electronically Signed   By: Lavonia Dana M.D.   On: 02/27/2021 11:20   CT IMAGE GUIDED DRAINAGE BY PERCUTANEOUS CATHETER  Result Date: 02/26/2021 INDICATION: 60 year old male with a history recent surgery, presentation with hemoptysis, and loculated fluid of the surgery site. He has been referred for CT-guided chest tube placement EXAM: CT-GUIDED CHEST TUBE MEDICATIONS: The patient is currently admitted to the hospital and receiving intravenous antibiotics. The antibiotics were administered within an appropriate time frame prior to the initiation of the procedure. ANESTHESIA/SEDATION: 1.0 mg IV Versed 25 mcg IV Fentanyl Moderate Sedation Time:  16 minutes The patient was continuously monitored during the procedure by the interventional radiology nurse under my direct supervision. COMPLICATIONS: None TECHNIQUE: Informed written consent was obtained from the patient after a thorough discussion of the procedural risks, benefits and alternatives. All questions were addressed. Maximal Sterile Barrier Technique was utilized including caps, mask, sterile gowns, sterile gloves, sterile drape, hand hygiene and skin antiseptic. A timeout was performed prior to the initiation of the procedure. PROCEDURE: Patient was positioned in the supine position on the CT gantry table and a scout CT of the chest was performed for planning purposes. Once angle of approach was determined, the skin and subcutaneous tissues this scan was prepped and draped in the usual sterile fashion, and a sterile drape was applied covering the operative field. A sterile gown and sterile gloves were used for the procedure. Local  anesthesia was provided with 1% Lidocaine. The skin and subcutaneous tissues were infiltrated 1% lidocaine for local anesthesia, and a small stab incision was made with an 11 blade scalpel. Using CT guidance, a 18 gauge trocar needle was advanced into the pleural space of the right apex. After confirmation of the tip, modified Seldinger technique was used to place a 10 French pigtail drainage catheter. Approximately 60 cc of reddish thin fluid aspirated. Culture was sent to the lab for analysis. Catheter was then attached to water seal chamber and suction was applied confirming a operational chest tube. Retention suture was placed.  Sterile dressing was placed. Patient tolerated the procedure well and remained hemodynamically stable throughout. No complications were encountered and no significant blood loss was encounter FINDINGS: The scout CT demonstrates essentially a similar distribution and volume of confluent ground-glass opacity of the right lower lobe, right middle lobe, and left lower lobe. No significant fluid identified within the airways. Similar appearance of the loculated fluid collection within the surgical site at the right apex. After placement of the chest tube approximately 60 cc of thin reddish fluid aspirated which appears to be serosanguineous. No frank abscess. Culture was sent. IMPRESSION: Status post CT-guided right apical chest tube into loculated fluid revealing serosanguineous fluid. Culture was sent. Signed, Dulcy Fanny. Dellia Nims, RPVI [facility] Electronically Signed   By: Corrie Mckusick D.O.   On: 02/26/2021 17:09        Scheduled Meds:  amLODipine  10 mg Oral Daily   calcium carbonate  1 tablet Oral TID   gabapentin  300 mg Oral TID   irbesartan  300 mg Oral Daily   latanoprost  1 drop Both Eyes QHS   QUEtiapine  50  mg Oral QHS   sertraline  100 mg Oral Daily   Continuous Infusions:  ceFEPime (MAXIPIME) IV 2 g  (02/28/21 0404)   potassium chloride 10 mEq (02/28/21 0811)   promethazine (PHENERGAN) injection (IM or IVPB)     vancomycin 1,250 mg (02/27/21 2119)     LOS: 2 days    Time spent: More than 50% of that time was spent in counseling and/or coordination of care.      Shelly Coss, MD [facility] P6/22/2022, 8:18 AM

## 2021-03-01 ENCOUNTER — Ambulatory Visit: Admission: RE | Admit: 2021-03-01 | Payer: 59 | Source: Ambulatory Visit | Admitting: Radiation Oncology

## 2021-03-01 ENCOUNTER — Ambulatory Visit: Payer: 59

## 2021-03-01 ENCOUNTER — Inpatient Hospital Stay (HOSPITAL_COMMUNITY): Payer: 59

## 2021-03-01 DIAGNOSIS — R0489 Hemorrhage from other sites in respiratory passages: Secondary | ICD-10-CM

## 2021-03-01 DIAGNOSIS — J942 Hemothorax: Secondary | ICD-10-CM

## 2021-03-01 DIAGNOSIS — Y95 Nosocomial condition: Secondary | ICD-10-CM

## 2021-03-01 DIAGNOSIS — J90 Pleural effusion, not elsewhere classified: Secondary | ICD-10-CM

## 2021-03-01 LAB — BASIC METABOLIC PANEL
Anion gap: 8 (ref 5–15)
BUN: 10 mg/dL (ref 6–20)
CO2: 23 mmol/L (ref 22–32)
Calcium: 8.6 mg/dL — ABNORMAL LOW (ref 8.9–10.3)
Chloride: 103 mmol/L (ref 98–111)
Creatinine, Ser: 0.53 mg/dL — ABNORMAL LOW (ref 0.61–1.24)
GFR, Estimated: >60 mL/min (ref >60)
Glucose, Bld: 98 mg/dL (ref 70–99)
Potassium: 3.5 mmol/L (ref 3.5–5.1)
Sodium: 134 mmol/L — ABNORMAL LOW (ref 135–145)

## 2021-03-01 LAB — CBC WITH DIFFERENTIAL/PLATELET
Abs Immature Granulocytes: 0.1 10*3/uL — ABNORMAL HIGH (ref 0.00–0.07)
Basophils Absolute: 0.1 10*3/uL (ref 0.0–0.1)
Basophils Relative: 0 %
Eosinophils Absolute: 0.7 10*3/uL — ABNORMAL HIGH (ref 0.0–0.5)
Eosinophils Relative: 4 %
HCT: 34.8 % — ABNORMAL LOW (ref 39.0–52.0)
Hemoglobin: 11.7 g/dL — ABNORMAL LOW (ref 13.0–17.0)
Immature Granulocytes: 1 %
Lymphocytes Relative: 8 %
Lymphs Abs: 1.3 10*3/uL (ref 0.7–4.0)
MCH: 30.7 pg (ref 26.0–34.0)
MCHC: 33.6 g/dL (ref 30.0–36.0)
MCV: 91.3 fL (ref 80.0–100.0)
Monocytes Absolute: 1.6 10*3/uL — ABNORMAL HIGH (ref 0.1–1.0)
Monocytes Relative: 10 %
Neutro Abs: 13 10*3/uL — ABNORMAL HIGH (ref 1.7–7.7)
Neutrophils Relative %: 77 %
Platelets: 332 10*3/uL (ref 150–400)
RBC: 3.81 MIL/uL — ABNORMAL LOW (ref 4.22–5.81)
RDW: 14.7 % (ref 11.5–15.5)
WBC: 16.7 10*3/uL — ABNORMAL HIGH (ref 4.0–10.5)
nRBC: 0 % (ref 0.0–0.2)

## 2021-03-01 IMAGING — DX DG CHEST 1V PORT
1 series · 1 of 1 positions shown · non-contrast
Comparison: [DATE].  [DATE].

CLINICAL DATA: Status post right lung lobectomy.

EXAM:
PORTABLE CHEST 1 VIEW

[chest]
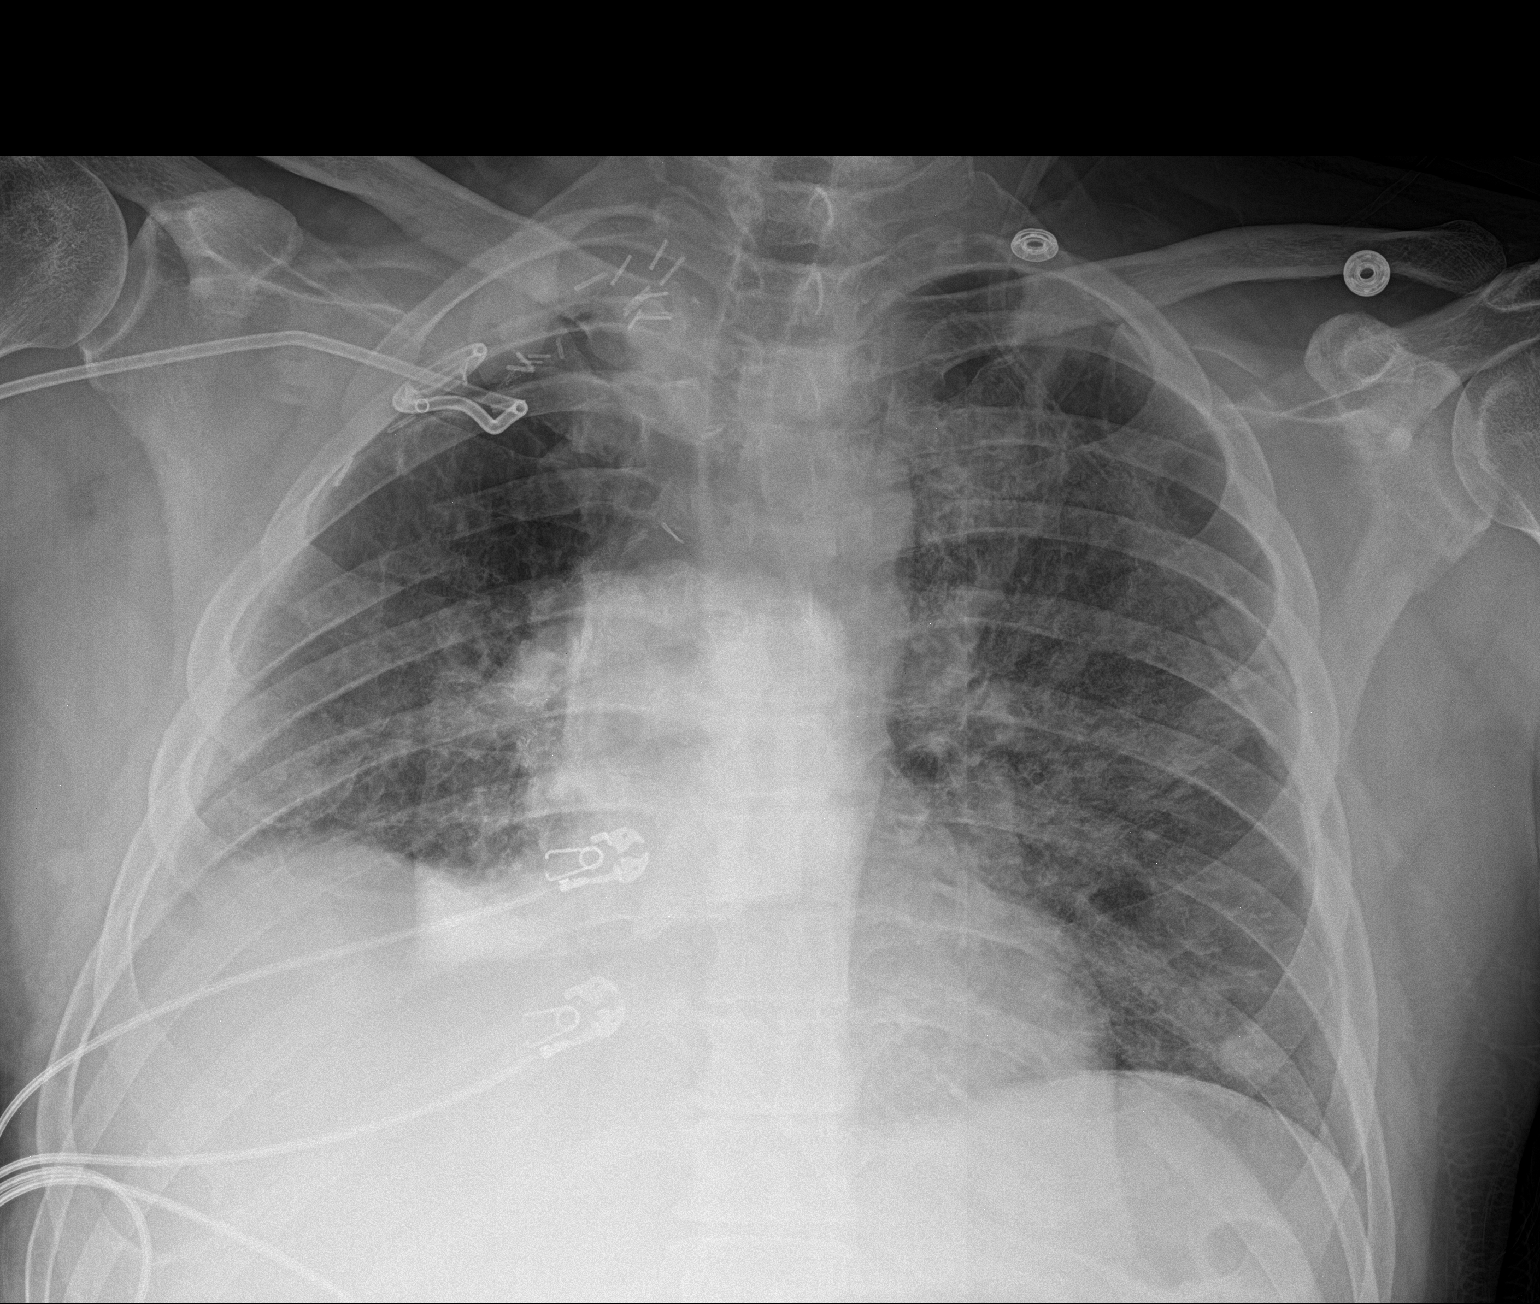

[1 of 1 positions shown; findings below may reference images not displayed]

FINDINGS: Right chest tube in stable position. Stable postsurgical changes
right apex. Tiny residual right apical pneumothorax cannot be
completely excluded. Bilateral interstitial infiltrates/edema,
slightly progressed on the left from prior exam. No prominent
pleural effusion. Heart size stable.
IMPRESSION: 1. Right apical chest tube in stable position. Stable postsurgical
changes right apex. Tiny residual right apical pneumothorax cannot
be completely excluded.

2. Bilateral interstitial infiltrates/edema slightly progressed on
the left from prior exam.

## 2021-03-01 MED ORDER — HYDROMORPHONE HCL 2 MG PO TABS
2.0000 mg | ORAL_TABLET | ORAL | Status: DC | PRN
Start: 2021-03-01 — End: 2021-03-02
  Administered 2021-03-01 – 2021-03-02 (×5): 2 mg via ORAL
  Filled 2021-03-01 (×5): qty 1

## 2021-03-01 MED ORDER — ZOLPIDEM TARTRATE 5 MG PO TABS
5.0000 mg | ORAL_TABLET | Freq: Every evening | ORAL | Status: DC | PRN
  Administered 2021-03-01: 5 mg via ORAL
  Filled 2021-03-01: qty 1

## 2021-03-01 NOTE — Plan of Care (Signed)

## 2021-03-01 NOTE — Progress Notes (Signed)
Procedure(s) (LRB): VIDEO BRONCHOSCOPY (N/A) Subjective: C/o pain, short term relief with fentanyl  Objective: Vital signs in last 24 hours: Temp:  [98.7 F (37.1 C)-99.9 F (37.7 C)] 98.7 F (37.1 C) (06/23 0732) Pulse Rate:  [93-103] 98 (06/23 0732) Cardiac Rhythm: Normal sinus rhythm (06/23 0727) Resp:  [16-33] 33 (06/23 0732) BP: (142-160)/(76-85) 149/79 (06/23 0732) SpO2:  [90 %-99 %] 92 % (06/23 0732) FiO2 (%):  [70 %] 70 % (06/23 0210)  Hemodynamic parameters for last 24 hours:    Intake/Output from previous day: 06/22 0701 - 06/23 0700 In: 1374 [P.O.:490; IV Piggyback:884] Out: 970 [Urine:750; Chest Tube:220] Intake/Output this shift: Total I/O In: -  Out: 300 [Urine:250; Chest Tube:50]  General appearance: alert and mild distress Neurologic: intact Heart: regular rate and rhythm Lungs: rhonchi bilaterally Small air leak  Lab Results: Recent Labs    02/28/21 0021 03/01/21 0023  WBC 17.9* 16.7*  HGB 11.2* 11.7*  HCT 34.4* 34.8*  PLT 345 332   BMET:  Recent Labs    02/28/21 0021 03/01/21 0023  NA 134* 134*  K 3.4* 3.5  CL 100 103  CO2 23 23  GLUCOSE 116* 98  BUN 8 10  CREATININE 0.56* 0.53*  CALCIUM 8.6* 8.6*    PT/INR:  Recent Labs    02/27/21 0026  LABPROT 14.6  INR 1.1   ABG    Component Value Date/Time   PHART 7.458 (H) 02/26/2021 0630   HCO3 28.7 (H) 02/26/2021 0630   TCO2 30 02/26/2021 0630   ACIDBASEDEF 1.3 01/18/2021 1504   O2SAT 96.0 02/26/2021 0630   CBG (last 3)  Recent Labs    02/26/21 1101 02/26/21 1700 02/26/21 2041  GLUCAP 135* 147* 135*    Assessment/Plan: S/P Procedure(s) (LRB): VIDEO BRONCHOSCOPY (N/A) - Loculated pleural effusion- well drained, small air leak from tube, maybe entraining around pigtail- will place to water seal Alveolar hemorrhage likely secondary to aspiration- on IV antibiotics WBC trending down Still relatively high O2 demand Only short term relief with fentanyl- will add  dilaudid PO as an option for him   LOS: 3 days    [name] 03/01/2021

## 2021-03-01 NOTE — Progress Notes (Signed)
NAME:  Christopher Carter, MRN:  [id number], DOB:  Mar 04, 1961, LOS: 3 ADMISSION DATE:  02/25/2021, CONSULTATION DATE:  6/20 REFERRING MD:  Dwyane Dee, CHIEF COMPLAINT:  respiratory failure and hemoptysis    History of Present Illness:  This is a 60 year old non-smoker.  Recently status post right upper lobe lobectomy with node dissection 5/16 for T4, N0, stage IIIa adenocarcinoma.  Surgical notes stated there was still residual tumor posterior near spine which was unable to be resected.  He is yet to undergo chemo or radiation.  Had been recovering slowly with expected postoperative pain.  Started to note decreased energy, more fatigue, overall malaise sometime around 6/15.  Had been seen the day prior by cardiothoracic's who noted loculated right upper lobe collection from surgical site for which plan CT-guided aspiration was planned for 6/21.  On 6/19 he continued to feel weak and fatigued, around midday he began to cough up blood.  Noting about a tablespoon at a time, reporting it is being bright red, and at its worst up to 6 times an hour.  At sometimes the cough would be so intense that it would make him nauseated and he would also vomit because of this, and worsening shortness of breath he reported to the emergency room around 8 PM on 6/19 .  In the ER he was found to be hypoxic requiring titration of oxygen up to 100% on heated high flow.  Initial CT imaging on 6/20 was negative for pulmonary embolus, there is a loculated moderate size right apical pleural fluid collection, diffuse bilateral airspace disease, and subacute right fifth and nondisplaced sixth rib fractures secondary to prior "thoracotomy.  He was admitted by the internal medicine service, cardiothoracic surgery is already consulted, because of the supplemental oxygen need pulmonary asked to evaluate  Pertinent  Medical History  Hypertension, depression.  Right upper lobe lung mass status post thoracotomy and right upper lobectomy with node  dissection 5/16.  Mass noted to be invading the chest wall with positive margins posteriorly near the spine.  He is T4, N0, stage IIIa adenocarcinoma.  Surgical notes document residual tumor posteriorly near the spine which was unable to be resected.  He has yet to undergo adjuvant radiation or chemo Last seen in cardiothoracic clinic on 6/17 at which time imaging noted to see a new loculated right upper lobe fluid collection and there was plan to proceed with CT-guided aspiration Significant Hospital Events: Including procedures, antibiotic start and stop dates in addition to other pertinent events   6/19 admitted with new hemoptysis,, fatigue, and worsening shortness of breath.  Found to be hypoxic on arrival requiring titration of supplemental oxygen up to heated high flow.  Started on azithromycin, ceftriaxone, and vancomycin  6/20   CT imaging obtained showing previously identified loculated right apical pleural effusion, negative for pulmonary emboli.  There is diffuse alveolar airspace disease.  Pulmonary asked to evaluate.  Interventional radiology consulted 6/20 IR placed anterior chestube into loculated effusion  Interim History / Subjective:  No further hemoptysis. Pain control is an issue. Minimal chest tube drainage  Objective   Blood pressure (!) 149/79, pulse 97, temperature 98.7 F (37.1 C), temperature source Oral, resp. rate (!) 23, height 5\' 10"  (1.778 m), weight 83.9 kg, SpO2 93 %.    FiO2 (%):  [60 %-70 %] 60 %   Intake/Output Summary (Last 24 hours) at 03/01/2021 0928 Last data filed at 03/01/2021 7989 Gross per 24 hour  Intake 1373.99 ml  Output 870 ml  Net 503.99 ml   Filed Weights   02/26/21 0208  Weight: 83.9 kg    Examination: General: acutely ill appearing HENT: mmm Lungs: improved air entry and RR improved from the last two days, right anterior chest tube with minimal sanguinous drainage Cardiovascular: tachycardic, regular Abdomen: Soft not tender no  organomegaly Extremities: Warm dry with brisk capillary refill Neuro: Awake oriented no focal deficits  Labs/imaging that I havepersonally reviewed  (right click and "Reselect all SmartList Selections" daily)  Reviewed -  Procalcitonin low Culture from pleural fluid negative thus far WBC - 17.9, downtrending Chest xray reviewed this morning - pigtail in good position, 220 bloody drainage in last 24 hours. Stable bilateral infiltrates  Resolved Hospital Problem list     Assessment & Plan:  Acute hypoxic respiratory failure (POA) in the setting of diffuse pulmonary infiltrates with associated hemoptysis, and right upper lobe loculated pleural effusion -s/p chest tube placement, RUL anterior bloody fluid collection Plan - stable chest xray, his chest tube is in good position with decreasing output.  - he feels marginally better today and I do think his RR and air entry are improved today from the last two days. Suspect pain control will help with mobility as well to improve V/Q mismatch from inflammation.  - narrowed abx to cefepime - would plan for 8 day course. Mrsa pcr negative so vancomycin stopped - Continue high flow supplemental oxygen, titrate down to goal saturations over 90%. I titrated him down to 60%.  - discussed with Dr. Roxan Hockey, given hemoptysis will hold on intrapleural fibrinolytics.  - continue bronchodilators scheduled.   Stage IIIa adenocarcinoma of the lung status post right upper lobe lobectomy, yet to undergo adjunctive therapy Plan Follow-up as outpatient  Will continue to follow. AM chest xray tomorrow.   [name], MD [facility]    Labs   CBC: Recent Labs  Lab 02/25/21 2204 02/26/21 0630 02/27/21 0026 02/28/21 0021 03/01/21 0023  WBC 29.4*  --  20.7* 17.9* 16.7*  NEUTROABS 26.0*  --  17.2*  --  13.0*  HGB 13.7 11.6* 11.6* 11.2* 11.7*  HCT 40.2 34.0* 34.1* 34.4* 34.8*  MCV 90.1  --  91.9 92.2  91.3  PLT 473*  --  378 345 449    Basic Metabolic Panel: Recent Labs  Lab 02/25/21 2204 02/26/21 0630 02/27/21 0026 02/28/21 0021 03/01/21 0023  NA 138 139 137 134* 134*  K 3.8 3.6 3.8 3.4* 3.5  CL 99  --  102 100 103  CO2 26  --  27 23 23   GLUCOSE 149*  --  121* 116* 98  BUN 11  --  6 8 10   CREATININE 0.71  --  0.52* 0.56* 0.53*  CALCIUM 9.3  --  9.0 8.6* 8.6*  MG  --   --  1.9 1.9  --   PHOS  --   --   --  2.8  --    GFR: Estimated Creatinine Clearance: 103.9 mL/min (A) (by C-G formula based on SCr of 0.53 mg/dL (L)). Recent Labs  Lab 02/25/21 2204 02/26/21 0241 02/26/21 0403 02/26/21 1215 02/27/21 0026 02/28/21 0021 03/01/21 0023  PROCALCITON  --   --   --   --  <0.10  --   --   WBC 29.4*  --   --   --  20.7* 17.9* 16.7*  LATICACIDVEN  --  1.6 3.7* 1.1  --   --   --  Liver Function Tests: Recent Labs  Lab 02/25/21 2204 02/27/21 0026  AST 21 21  ALT 28 19  ALKPHOS 103 75  BILITOT 1.1 1.0  PROT 7.1 5.9*  ALBUMIN 3.6 3.0*   Recent Labs  Lab 02/25/21 2204  LIPASE 18   No results for input(s): AMMONIA in the last 168 hours.  ABG    Component Value Date/Time   PHART 7.458 (H) 02/26/2021 0630   PCO2ART 40.5 02/26/2021 0630   PO2ART 79 (L) 02/26/2021 0630   HCO3 28.7 (H) 02/26/2021 0630   TCO2 30 02/26/2021 0630   ACIDBASEDEF 1.3 01/18/2021 1504   O2SAT 96.0 02/26/2021 0630     Coagulation Profile: Recent Labs  Lab 02/25/21 2204 02/27/21 0026  INR 1.1 1.1    Cardiac Enzymes: No results for input(s): CKTOTAL, CKMB, CKMBINDEX, TROPONINI in the last 168 hours.  HbA1C: No results found for: HGBA1C  CBG: Recent Labs  Lab 02/26/21 1101 02/26/21 1700 02/26/21 2041  GLUCAP 135* 147* 135*

## 2021-03-01 NOTE — Progress Notes (Signed)
[facility] Progress Note  Patient: Christopher Carter    WGN:[id number]  DOA: 02/25/2021     Date of Service: the patient was seen and examined on 03/01/2021  Brief hospital course: Patient is a 60 year old male with history of hypertension, right upper lobe stage IIIa adenocarcinoma of the lung, depression who was recently admitted here from 5/16-5/20 during which he underwent right upper lobe lobectomy and chest wall dissection(VATS) by Dr. Roxan Hockey and was discharged without any complication on 7/84 presented to the emergency department with complaints of severe cough, bright red hemoptysis, shortness of breath.  On presentation he was found to have leukocytosis in the range of 29,000, lactic acid was elevated.  Chest CT revealed diffuse infiltrates concerning for alveolar hemorrhage with possible superimposed infectious process/pulmonary edema.  Patient was started on broad-spectrum antibiotics.  PCCM/IR consulted and he underwent right-sided chest tube placement for hemothorax.  Cardiothoracic surgery also consulting.  Patient currently being managed for acute hypoxic respiratory failure requiring high flow oxygen, possible superimposed pneumonia.  Plan is to do bronchoscopy after improvement in the oxygen requirement.  Assessment and Plan: Acute hypoxic respiratory failure in the setting of right upper lobe loculated pleural effusion/hemothorax: Presented with hemoptysis, cough, shortness of breath requiring supplemental oxygen.  His hypoxia worsened during the hospitalization and currently he is on high flow oxygen.  Acute hypoxic respite failure is multifactorial secondary to progressive alveolar hemorrhage, possible superimposed pneumonia.  Underwent pigtail chest tube placement by IR on 5/21.  Continue supportive care, supplemental oxygen, cough medicines.  PCCM, cardiothoracic surgery following.  Plan is to do bronchoscopy when oxygen requirement is better.   Superimposed  pneumonia/severe sepsis: Presented with tachycardia, tachypnea, leukocytosis, elevated lactic acid level, chest infiltrates.  Continue broad antibiotics with vancomycin and cefepime.  Follow-up cultures.  Currently blood pressure stable, afebrile. Leukocytosis is improving   Lactic acidosis: Resolved with IV fluids   Hypertension: Currently blood pressure stable.  On Avapro, amlodipine   History of depression: Continue home medications   Adenocarcinoma of the right lung: Diagnosed in April 2022, follows with Dr. Julien Nordmann.  Status post recent VATS with right upper lobe lobectomy and chest wall dissection by cardiothoracic surgery, Dr. Koleen Nimrod.  Needs outpatient follow-up.  Hypokalemia: Supplemented with potassium.  Pain controlled. Medication adjusted today. Will monitor for response and if no improvement modify further.  Insomnia. Ambien.  Body mass index is 26.54 kg/m.   Diet: Regular diet DVT Prophylaxis:   SCDs Start: 02/26/21 6962    Advance goals of care discussion: Full code  Family Communication: no family was present at bedside, at the time of interview.   Disposition:  Status is: Inpatient  Remains inpatient appropriate because:Inpatient level of care appropriate due to severity of illness  Dispo: The patient is from: Home              Anticipated d/c is to: Home              Patient currently is not medically stable to d/c.   Difficult to place patient No        Subjective: Continues to have pain.  No nausea no vomiting.  Unable to sleep at night.  No diarrhea no constipation.  Physical Exam:  General: Appear in mild distress, no Rash; Oral Mucosa Clear, moist. no Abnormal Neck Mass Or lumps, Conjunctiva normal  Cardiovascular: S1 and S2 Present, no Murmur, Respiratory: increased respiratory effort, Bilateral Air entry present and bilateral  Crackles, no wheezes Abdomen: Bowel Sound present,  Soft and no tenderness Extremities: trace Pedal  edema Neurology: alert and oriented to time, place, and person affect appropriate. no new focal deficit Gait not checked due to patient safety concerns    Vitals:   03/01/21 1851 03/01/21 1941 03/01/21 1948 03/01/21 1955  BP:    (!) 149/78  Pulse:    95  Resp:    (!) 22  Temp:  98.8 F (37.1 C)    TempSrc:  Oral    SpO2: 94%  97% 90%  Weight:      Height:        Intake/Output Summary (Last 24 hours) at 03/01/2021 2040 Last data filed at 03/01/2021 1941 Gross per 24 hour  Intake 1123.99 ml  Output 810 ml  Net 313.99 ml   Filed Weights   02/26/21 0208  Weight: 83.9 kg    Data Reviewed: I have personally reviewed and interpreted daily labs, tele strips, imaging. I reviewed all nursing notes, pharmacy notes, vitals, pertinent old records I have discussed plan of care as described above with RN and patient/family.  CBC: Recent Labs  Lab 02/25/21 2204 02/26/21 0630 02/27/21 0026 02/28/21 0021 03/01/21 0023  WBC 29.4*  --  20.7* 17.9* 16.7*  NEUTROABS 26.0*  --  17.2*  --  13.0*  HGB 13.7 11.6* 11.6* 11.2* 11.7*  HCT 40.2 34.0* 34.1* 34.4* 34.8*  MCV 90.1  --  91.9 92.2 91.3  PLT 473*  --  378 345 382   Basic Metabolic Panel: Recent Labs  Lab 02/25/21 2204 02/26/21 0630 02/27/21 0026 02/28/21 0021 03/01/21 0023  NA 138 139 137 134* 134*  K 3.8 3.6 3.8 3.4* 3.5  CL 99  --  102 100 103  CO2 26  --  27 23 23   GLUCOSE 149*  --  121* 116* 98  BUN 11  --  6 8 10   CREATININE 0.71  --  0.52* 0.56* 0.53*  CALCIUM 9.3  --  9.0 8.6* 8.6*  MG  --   --  1.9 1.9  --   PHOS  --   --   --  2.8  --     Studies: DG Chest Port 1 View  Result Date: 03/01/2021 CLINICAL DATA:  Status post right lung lobectomy. EXAM: PORTABLE CHEST 1 VIEW COMPARISON:  02/28/2021.  02/27/2021. FINDINGS: Right chest tube in stable position. Stable postsurgical changes right apex. Tiny residual right apical pneumothorax cannot be completely excluded. Bilateral interstitial infiltrates/edema,  slightly progressed on the left from prior exam. No prominent pleural effusion. Heart size stable. IMPRESSION: 1. Right apical chest tube in stable position. Stable postsurgical changes right apex. Tiny residual right apical pneumothorax cannot be completely excluded. 2. Bilateral interstitial infiltrates/edema slightly progressed on the left from prior exam. Electronically Signed   By: Grimes   On: 03/01/2021 08:36    Scheduled Meds:  amLODipine  10 mg Oral Daily   calcium carbonate  1 tablet Oral TID   gabapentin  300 mg Oral TID   ipratropium-albuterol  3 mL Nebulization Q6H WA   irbesartan  300 mg Oral Daily   latanoprost  1 drop Both Eyes QHS   QUEtiapine  50 mg Oral QHS   sertraline  100 mg Oral Daily   Continuous Infusions:  ceFEPime (MAXIPIME) IV 2 g (03/01/21 1948)   promethazine (PHENERGAN) injection (IM or IVPB)     PRN Meds: acetaminophen **OR** acetaminophen, albuterol, alum & mag hydroxide-simeth, fentaNYL (SUBLIMAZE) injection **OR** fentaNYL (SUBLIMAZE) injection, HYDROmorphone, ondansetron **  OR** ondansetron (ZOFRAN) IV, polyethylene glycol, promethazine (PHENERGAN) injection (IM or IVPB), zolpidem  Time spent: 35 minutes  Author: Berle Mull, MD [facility] 03/01/2021 8:40 PM  To reach On-call, see care teams to locate the attending and reach out via [email or web address]. Between 7PM-7AM, please contact night-coverage If you still have difficulty reaching the attending provider, please page the [facility] (Director on Call) for [facility] on amion for assistance.

## 2021-03-01 NOTE — Progress Notes (Signed)
Pharmacy Antibiotic Note  Christopher Carter is a 60 y.o. male admitted on 02/25/2021 with sepsis. Pt started on vancomycin and cefepime, now narrowed to cefepime with negative MRSA PCR. Cr stable, planning 8 day course per CCM.  Plan: -Cefepime 2g IV q8h    Height: 5\' 10"  (177.8 cm) Weight: 83.9 kg (185 lb) IBW/kg (Calculated) : 73  Temp (24hrs), Avg:99.1 F (37.3 C), Min:98.7 F (37.1 C), Max:99.9 F (37.7 C)  Recent Labs  Lab 02/25/21 2204 02/26/21 0241 02/26/21 0403 02/26/21 1215 02/27/21 0026 02/28/21 0021 03/01/21 0023  WBC 29.4*  --   --   --  20.7* 17.9* 16.7*  CREATININE 0.71  --   --   --  0.52* 0.56* 0.53*  LATICACIDVEN  --  1.6 3.7* 1.1  --   --   --      Estimated Creatinine Clearance: 103.9 mL/min (A) (by C-G formula based on SCr of 0.53 mg/dL (L)).    Allergies  Allergen Reactions   Oxycodone Hcl Other (See Comments)    Pt reported "seeing things" and " feeling unusual."   Bupropion Nausea And Vomiting    Arrie Senate, PharmD, BCPS, [facility] Clinical Pharmacist [phone] Please check AMION for all [place] numbers 03/01/2021

## 2021-03-02 ENCOUNTER — Inpatient Hospital Stay (HOSPITAL_COMMUNITY): Payer: 59

## 2021-03-02 LAB — CBC WITH DIFFERENTIAL/PLATELET
Abs Immature Granulocytes: 0.1 10*3/uL — ABNORMAL HIGH (ref 0.00–0.07)
Basophils Absolute: 0.1 10*3/uL (ref 0.0–0.1)
Basophils Relative: 0 %
Eosinophils Absolute: 0.8 10*3/uL — ABNORMAL HIGH (ref 0.0–0.5)
Eosinophils Relative: 5 %
HCT: 35.2 % — ABNORMAL LOW (ref 39.0–52.0)
Hemoglobin: 12 g/dL — ABNORMAL LOW (ref 13.0–17.0)
Immature Granulocytes: 1 %
Lymphocytes Relative: 5 %
Lymphs Abs: 0.8 10*3/uL (ref 0.7–4.0)
MCH: 30.6 pg (ref 26.0–34.0)
MCHC: 34.1 g/dL (ref 30.0–36.0)
MCV: 89.8 fL (ref 80.0–100.0)
Monocytes Absolute: 1.6 10*3/uL — ABNORMAL HIGH (ref 0.1–1.0)
Monocytes Relative: 9 %
Neutro Abs: 13.7 10*3/uL — ABNORMAL HIGH (ref 1.7–7.7)
Neutrophils Relative %: 80 %
Platelets: 366 10*3/uL (ref 150–400)
RBC: 3.92 MIL/uL — ABNORMAL LOW (ref 4.22–5.81)
RDW: 14.6 % (ref 11.5–15.5)
WBC: 17.1 10*3/uL — ABNORMAL HIGH (ref 4.0–10.5)
nRBC: 0 % (ref 0.0–0.2)

## 2021-03-02 LAB — BASIC METABOLIC PANEL
Anion gap: 10 (ref 5–15)
BUN: 10 mg/dL (ref 6–20)
CO2: 25 mmol/L (ref 22–32)
Calcium: 8.5 mg/dL — ABNORMAL LOW (ref 8.9–10.3)
Chloride: 97 mmol/L — ABNORMAL LOW (ref 98–111)
Creatinine, Ser: 0.52 mg/dL — ABNORMAL LOW (ref 0.61–1.24)
GFR, Estimated: >60 mL/min (ref >60)
Glucose, Bld: 177 mg/dL — ABNORMAL HIGH (ref 70–99)
Potassium: 3 mmol/L — ABNORMAL LOW (ref 3.5–5.1)
Sodium: 132 mmol/L — ABNORMAL LOW (ref 135–145)

## 2021-03-02 LAB — MAGNESIUM: Magnesium: 2 mg/dL (ref 1.7–2.4)

## 2021-03-02 IMAGING — DX DG CHEST 1V PORT
1 series · 1 of 1 positions shown · non-contrast
Comparison: [DATE]

CLINICAL DATA: VATS/right upper lobectomy.  Right chest tube.

EXAM:
PORTABLE CHEST 1 VIEW

[chest ap]
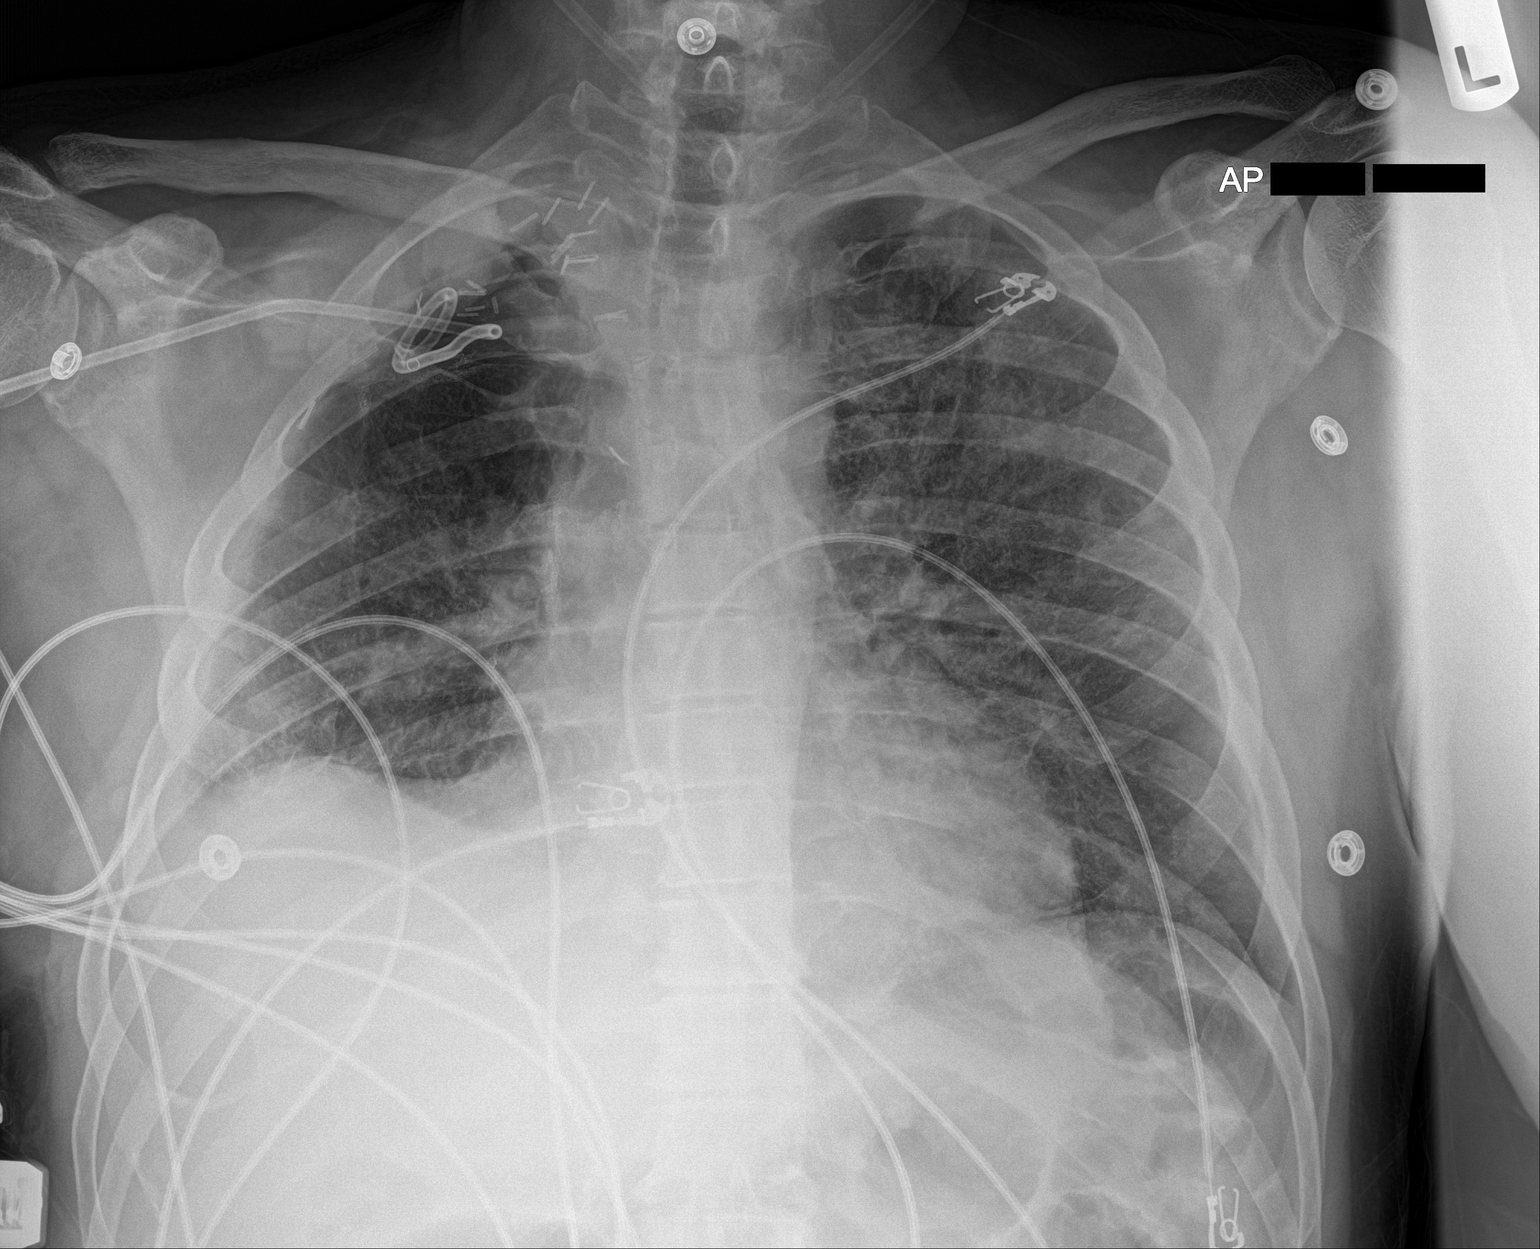

[1 of 1 positions shown; findings below may reference images not displayed]

FINDINGS: Stable position of the right apical chest tube. Residual pleural
based densities at the right lung apex with postsurgical changes.
Stable volume loss in the right hemithorax. Hazy interstitial lung
densities bilaterally that have minimally changed. Heart size is
stable.
IMPRESSION: 1. Stable positioning of the right apical small bore chest tube.
Aeration at the right lung apex has markedly improved since chest
tube placement but there are residual densities in this area.
Negative for a large pneumothorax.
2. Diffuse interstitial lung densities that could represent
interstitial pulmonary edema. Findings are similar to the recent
comparison examination.

## 2021-03-02 MED ORDER — HYDROMORPHONE HCL 2 MG PO TABS
2.0000 mg | ORAL_TABLET | ORAL | Status: DC | PRN
  Administered 2021-03-02 – 2021-03-05 (×11): 4 mg via ORAL
  Administered 2021-03-05: 2 mg via ORAL
  Administered 2021-03-06 – 2021-03-07 (×5): 4 mg via ORAL
  Administered 2021-03-07: 2 mg via ORAL
  Filled 2021-03-02 (×10): qty 2
  Filled 2021-03-02: qty 1
  Filled 2021-03-02 (×7): qty 2

## 2021-03-02 MED ORDER — KETOROLAC TROMETHAMINE 30 MG/ML IJ SOLN
30.0000 mg | Freq: Four times a day (QID) | INTRAMUSCULAR | Status: DC
  Administered 2021-03-02 – 2021-03-05 (×12): 30 mg via INTRAVENOUS
  Filled 2021-03-02 (×12): qty 1

## 2021-03-02 MED ORDER — POTASSIUM CHLORIDE CRYS ER 20 MEQ PO TBCR
40.0000 meq | EXTENDED_RELEASE_TABLET | Freq: Once | ORAL | Status: AC
  Administered 2021-03-02: 40 meq via ORAL
  Filled 2021-03-02: qty 2

## 2021-03-02 MED ORDER — HYDRALAZINE HCL 25 MG PO TABS: 25.0000 mg | ORAL_TABLET | Freq: Four times a day (QID) | ORAL | Status: DC | PRN

## 2021-03-02 MED ORDER — METHYLPREDNISOLONE SODIUM SUCC 40 MG IJ SOLR
40.0000 mg | Freq: Two times a day (BID) | INTRAMUSCULAR | Status: DC
  Administered 2021-03-02 – 2021-03-05 (×6): 40 mg via INTRAVENOUS
  Filled 2021-03-02 (×6): qty 1

## 2021-03-02 MED ORDER — FUROSEMIDE 10 MG/ML IJ SOLN
40.0000 mg | Freq: Once | INTRAMUSCULAR | Status: AC
  Administered 2021-03-02: 40 mg via INTRAVENOUS
  Filled 2021-03-02: qty 4

## 2021-03-02 MED ORDER — GABAPENTIN 400 MG PO CAPS
400.0000 mg | ORAL_CAPSULE | Freq: Three times a day (TID) | ORAL | Status: DC
  Administered 2021-03-02 – 2021-03-07 (×16): 400 mg via ORAL
  Filled 2021-03-02 (×16): qty 1

## 2021-03-02 NOTE — Progress Notes (Signed)
[facility] Progress Note  Patient: Christopher Carter    TIR:[id number]  DOA: 02/25/2021     Date of Service: the patient was seen and examined on 03/02/2021  Brief hospital course: Patient is a 60 year old male with history of HTN, stage III adenocarcinoma of lung, depression, recent admission 5/16-5/20 during which he underwent right upper lobe lobectomy and chest wall dissection(VATS) by Dr. Roxan Hockey, presented with severe cough, bright red hemoptysis, shortness of breath.  Chest CT revealed diffuse infiltrates concerning for alveolar hemorrhage with possible superimposed infectious process/pulmonary edema.   Patient was started on broad-spectrum antibiotics. PCCM/IR consulted and he underwent right-sided chest tube placement for hemothorax.   Cardiothoracic surgery also consulting.   Patient currently being managed for acute hypoxic respiratory failure requiring high flow oxygen, possible superimposed pneumonia.  Assessment and Plan: Acute hypoxic respiratory failure present on admission. Right upper lobe loculated pleural effusion Hemothorax concern for alveolar hemorrhage Recent right upper lobectomy for stage III adenocarcinoma. Severe sepsis secondary to healthcare associated pneumonia Presented with hemoptysis, cough, shortness of breath requiring supplemental oxygen.  Met SIRS criteria on admission with tachycardia, tachypnea, leukocytosis.  Endorgan damage with lactic acidosis. His hypoxia worsened during the hospitalization and currently he is on high flow oxygen.   Acute hypoxic respite failure is multifactorial secondary to progressive alveolar hemorrhage, possible superimposed pneumonia.  Pigtail chest tube placement by IR on 5/21. Cardiothoracic surgery consulted. Pulmonary consulted as well.  Patient may require bronchoscopy Started on broad-spectrum IV biotics, currently on IV cefepime. Cultures so far negative without any growth. Leukocytosis  improving. Chest x-ray on 6/24 shows increasing congestion, started on IV Lasix x1 with significant improvement respiratory status. Also IV steroids were added 6-24 to aid in healing. Monitor PCCM and cardiothoracic surgery recommendation.  Pain control. Anxiety Remains to be one of the main issue. No improvement with IV fentanyl. Oral Dilaudid was added but continues to remain with severe pain. Currently Toradol, gabapentin dose increased, Dilaudid dose increased, continue fentanyl. May benefit from being on benzodiazepine.  Lactic acidosis:  Resolved with IV fluids   Accelerated hypertension Currently blood pressure elevated secondary to pain and anxiety On Avapro, amlodipine   History of depression:  Continue home medications   Adenocarcinoma of the right lung:  Diagnosed in April 2022, follows with Dr. Julien Nordmann.  Status post recent VATS with right upper lobe lobectomy and chest wall dissection by cardiothoracic surgery, Dr. Koleen Nimrod. Needs outpatient follow-up.  Hypokalemia:  Supplemented with potassium.  Insomnia. Ambien.  Body mass index is 26.54 kg/m.   Diet: Regular diet DVT Prophylaxis:   SCDs Start: 02/26/21 6761    Advance goals of care discussion: Full code  Family Communication: family was present at bedside, at the time of interview.   Disposition:  Status is: Inpatient  Remains inpatient appropriate because:Inpatient level of care appropriate due to severity of illness  Dispo: The patient is from: Home              Anticipated d/c is to: Home              Patient currently is not medically stable to d/c.   Difficult to place patient No  Subjective: Pain remains an issue.  No nausea no vomiting.  Continues to have shortness of breath continues to have cough.  Continues to have fatigue and tiredness.  Also reports stress and anxiety.  Physical Exam:  General: Appear in mild distress, no Rash; Oral Mucosa Clear, moist. no Abnormal Neck Mass Or  lumps, Conjunctiva normal  Cardiovascular: S1 and S2 Present, no Murmur, Respiratory: increased respiratory effort, Bilateral Air entry present and bilateral  Crackles, no wheezes Abdomen: Bowel Sound present, Soft and no tenderness Extremities: trace Pedal edema Neurology: alert and oriented to time, place, and person affect appropriate. no new focal deficit Gait not checked due to patient safety concerns  Vitals:   03/02/21 1133 03/02/21 1347 03/02/21 1531 03/02/21 1900  BP: (!) 145/76 (!) 143/71 129/71 138/81  Pulse: 89 92 97 86  Resp: (!) 25 (!) 22 (!) 21 14  Temp: 99.4 F (37.4 C)  98.7 F (37.1 C) 99.6 F (37.6 C)  TempSrc: Oral  Oral Oral  SpO2: 91% 93% 90% 90%  Weight:      Height:        Intake/Output Summary (Last 24 hours) at 03/02/2021 2012 Last data filed at 03/02/2021 1900 Gross per 24 hour  Intake 400 ml  Output 1120 ml  Net -720 ml    Filed Weights   02/26/21 0208  Weight: 83.9 kg    Data Reviewed: I have personally reviewed and interpreted daily labs, tele strips, imaging. I reviewed all nursing notes, pharmacy notes, vitals, pertinent old records I have discussed plan of care as described above with RN and patient/family.  CBC: Recent Labs  Lab 02/25/21 2204 02/26/21 0630 02/27/21 0026 02/28/21 0021 03/01/21 0023 03/02/21 1254  WBC 29.4*  --  20.7* 17.9* 16.7* 17.1*  NEUTROABS 26.0*  --  17.2*  --  13.0* 13.7*  HGB 13.7 11.6* 11.6* 11.2* 11.7* 12.0*  HCT 40.2 34.0* 34.1* 34.4* 34.8* 35.2*  MCV 90.1  --  91.9 92.2 91.3 89.8  PLT 473*  --  378 345 332 295    Basic Metabolic Panel: Recent Labs  Lab 02/25/21 2204 02/26/21 0630 02/27/21 0026 02/28/21 0021 03/01/21 0023 03/02/21 1254  NA 138 139 137 134* 134* 132*  K 3.8 3.6 3.8 3.4* 3.5 3.0*  CL 99  --  102 100 103 97*  CO2 26  --  $R'27 23 23 25  'yh$ GLUCOSE 149*  --  121* 116* 98 177*  BUN 11  --  $R'6 8 10 10  'Jz$ CREATININE 0.71  --  0.52* 0.56* 0.53* 0.52*  CALCIUM 9.3  --  9.0 8.6* 8.6*  8.5*  MG  --   --  1.9 1.9  --  2.0  PHOS  --   --   --  2.8  --   --      Studies: DG Chest Port 1 View  Result Date: 03/02/2021 CLINICAL DATA:  VATS/right upper lobectomy.  Right chest tube. EXAM: PORTABLE CHEST 1 VIEW COMPARISON:  03/01/2021 FINDINGS: Stable position of the right apical chest tube. Residual pleural based densities at the right lung apex with postsurgical changes. Stable volume loss in the right hemithorax. Hazy interstitial lung densities bilaterally that have minimally changed. Heart size is stable. IMPRESSION: 1. Stable positioning of the right apical small bore chest tube. Aeration at the right lung apex has markedly improved since chest tube placement but there are residual densities in this area. Negative for a large pneumothorax. 2. Diffuse interstitial lung densities that could represent interstitial pulmonary edema. Findings are similar to the recent comparison examination. Electronically Signed   By: Markus Daft M.D.   On: 03/02/2021 11:04    Scheduled Meds:  amLODipine  10 mg Oral Daily   calcium carbonate  1 tablet Oral TID   gabapentin  400 mg Oral TID  ipratropium-albuterol  3 mL Nebulization Q6H WA   irbesartan  300 mg Oral Daily   ketorolac  30 mg Intravenous Q6H   latanoprost  1 drop Both Eyes QHS   methylPREDNISolone (SOLU-MEDROL) injection  40 mg Intravenous Q12H   QUEtiapine  50 mg Oral QHS   sertraline  100 mg Oral Daily   Continuous Infusions:  ceFEPime (MAXIPIME) IV 2 g (03/02/21 1224)   promethazine (PHENERGAN) injection (IM or IVPB)     PRN Meds: acetaminophen **OR** acetaminophen, albuterol, alum & mag hydroxide-simeth, [DISCONTINUED] fentaNYL (SUBLIMAZE) injection **OR** fentaNYL (SUBLIMAZE) injection, hydrALAZINE, HYDROmorphone, ondansetron **OR** ondansetron (ZOFRAN) IV, polyethylene glycol, promethazine (PHENERGAN) injection (IM or IVPB), zolpidem  Time spent: 35 minutes  Author: Berle Mull, MD [facility] 03/02/2021 8:12  PM  To reach On-call, see care teams to locate the attending and reach out via [email or web address]. Between 7PM-7AM, please contact night-coverage If you still have difficulty reaching the attending provider, please page the [facility] (Director on Call) for [facility] on amion for assistance.

## 2021-03-02 NOTE — Plan of Care (Signed)
  Problem: Education: Goal: Knowledge of General Education information will improve Description: Including pain rating scale, medication(s)/side effects and non-pharmacologic comfort measures Outcome: Progressing   Problem: Health Behavior/Discharge Planning: Goal: Ability to manage health-related needs will improve Outcome: Progressing   Problem: Clinical Measurements: Goal: Ability to maintain clinical measurements within normal limits will improve Outcome: Progressing Goal: Will remain free from infection Outcome: Progressing Goal: Respiratory complications will improve Outcome: Progressing   Problem: Activity: Goal: Risk for activity intolerance will decrease Outcome: Progressing   Problem: Coping: Goal: Level of anxiety will decrease Outcome: Progressing

## 2021-03-02 NOTE — Progress Notes (Addendum)
NAME:  Christopher Carter, MRN:  [id number], DOB:  1961-04-06, LOS: 4 ADMISSION DATE:  02/25/2021, CONSULTATION DATE:  6/20 REFERRING MD:  Dwyane Dee, CHIEF COMPLAINT:  respiratory failure and hemoptysis    History of Present Illness:  This is a 60 year old non-smoker.  Recently status post right upper lobe lobectomy with node dissection 5/16 for T4, N0, stage IIIa adenocarcinoma.  Surgical notes stated there was still residual tumor posterior near spine which was unable to be resected.  He is yet to undergo chemo or radiation.  Had been recovering slowly with expected postoperative pain.  Started to note decreased energy, more fatigue, overall malaise sometime around 6/15.  Had been seen the day prior by cardiothoracic's who noted loculated right upper lobe collection from surgical site for which plan CT-guided aspiration was planned for 6/21.  On 6/19 he continued to feel weak and fatigued, around midday he began to cough up blood.  Noting about a tablespoon at a time, reporting it is being bright red, and at its worst up to 6 times an hour.  At sometimes the cough would be so intense that it would make him nauseated and he would also vomit because of this, and worsening shortness of breath he reported to the emergency room around 8 PM on 6/19 .  In the ER he was found to be hypoxic requiring titration of oxygen up to 100% on heated high flow.  Initial CT imaging on 6/20 was negative for pulmonary embolus, there is a loculated moderate size right apical pleural fluid collection, diffuse bilateral airspace disease, and subacute right fifth and nondisplaced sixth rib fractures secondary to prior "thoracotomy.  He was admitted by the internal medicine service, cardiothoracic surgery is already consulted, because of the supplemental oxygen need pulmonary asked to evaluate  Pertinent  Medical History  Hypertension, depression.  Right upper lobe lung mass status post thoracotomy and right upper lobectomy with node  dissection 5/16.  Mass noted to be invading the chest wall with positive margins posteriorly near the spine.  He is T4, N0, stage IIIa adenocarcinoma.  Surgical notes document residual tumor posteriorly near the spine which was unable to be resected.  He has yet to undergo adjuvant radiation or chemo Last seen in cardiothoracic clinic on 6/17 at which time imaging noted to see a new loculated right upper lobe fluid collection and there was plan to proceed with CT-guided aspiration Significant Hospital Events: Including procedures, antibiotic start and stop dates in addition to other pertinent events   6/19 admitted with new hemoptysis,, fatigue, and worsening shortness of breath.  Found to be hypoxic on arrival requiring titration of supplemental oxygen up to heated high flow.  Started on azithromycin, ceftriaxone, and vancomycin  6/20   CT imaging obtained showing previously identified loculated right apical pleural effusion, negative for pulmonary emboli.  There is diffuse alveolar airspace disease.  Pulmonary asked to evaluate.  Interventional radiology consulted 6/20 IR placed anterior chestube into loculated effusion  Interim History / Subjective:  No further hemoptysis. Remains on 20-25 L HHF oxygen with sats of 93%.  They drop with minimal exertion per patient wife. RR is 27 Chest tube is draining about 100 cc per 12 hour shift. There is no leak that I can detect.   HGB 11.7 on 6/23 Mag 1.9 on 6/22 K was 3.5 on 6/23 No labs 6/24  Net + 3800 cc's CXR with improved aeration of right lung apex, with residual densities , CT in place Diffuse interstitial  lung densities that could represent interstitial pulmonary edema  Objective   Blood pressure (!) 169/85, pulse 96, temperature 99.5 F (37.5 C), temperature source Oral, resp. rate 20, height 5\' 10"  (1.778 m), weight 83.9 kg, SpO2 90 %.    FiO2 (%):  [60 %-100 %] 70 %   Intake/Output Summary (Last 24 hours) at 03/02/2021 1101 Last data  filed at 03/02/2021 0450 Gross per 24 hour  Intake 235.88 ml  Output 410 ml  Net -174.12 ml   Filed Weights   02/26/21 0208  Weight: 83.9 kg    Examination: General: acutely ill appearing male on HHF oxygen  HENT: MM  pink and moist, No LAD Lungs: Bilateral chest excursion, good airway movement right and left chest, few rhonchi Cardiovascular: tachycardic, regular, No RMG Abdomen: Soft not tender , ND, BS +,no organomegaly Extremities: Warm dry with brisk capillary refill, no edema Neuro: Awake oriented no focal deficits, anxious  Labs/imaging that I havepersonally reviewed  (right click and "Reselect all SmartList Selections" daily)  Reviewed -  Procalcitonin low Culture from pleural fluid negative thus far WBC - 17.9, downtrending Chest xray reviewed this morning - pigtail in good position, 210 bloody drainage in last 24 hours. Stable bilateral infiltrates, interstitial pulmonary edema  Resolved Hospital Problem list     Assessment & Plan:  Acute hypoxic respiratory failure (POA) in the setting of diffuse pulmonary infiltrates with associated hemoptysis, and right upper lobe loculated pleural effusion -s/p chest tube placement, RUL anterior bloody fluid collection - Drained 210 cc last 24 hours, no leak per sahara - CXR looks wet 6/24, Net + 3800 cc's - Remains on 20-25 L High flow oxygen, desaturated with minimal exertion Plan - Suspect pain control will help with mobility as well to improve V/Q mismatch from inflammation.  - narrowed abx to cefepime Day 4  -  plan for 8 day course. Mrsa pcr negative so vancomycin stopped - Continue high flow supplemental oxygen, titrate down to goal saturations over 90%. I titrated him down to 60%.  - hold on intrapleural fibrinolytics. For now, but no hemoptysis last 48 hours.   - continue bronchodilators scheduled.  - Diuresis 6/24 - Aggressive pulmonary Toilet as able with managed pain control/ OOB to chair - IS as  able  Electrolyte Management Plan Trend BMET and Mag Get BMET now , Mag now  Replete as needed  Leukocytosis 6/23 T Max 99.7 Last WBC 16.7 Plan Check CBC now and trend Trend fever curve Follow cultures ( Pending)    Stage IIIa adenocarcinoma of the lung status post right upper lobe lobectomy, yet to undergo adjunctive therapy Plan Follow-up as outpatient  Will continue to follow. AM chest xray  6/25  20 minutes CC APP Time  Magdalen Spatz, MSN, AGACNP-BC [place] for personal pager PCCM on call pager [phone] hospital use only, not for office use 03/02/2021    Labs   CBC: Recent Labs  Lab 02/25/21 2204 02/26/21 0630 02/27/21 0026 02/28/21 0021 03/01/21 0023  WBC 29.4*  --  20.7* 17.9* 16.7*  NEUTROABS 26.0*  --  17.2*  --  13.0*  HGB 13.7 11.6* 11.6* 11.2* 11.7*  HCT 40.2 34.0* 34.1* 34.4* 34.8*  MCV 90.1  --  91.9 92.2 91.3  PLT 473*  --  378 345 657    Basic Metabolic Panel: Recent Labs  Lab 02/25/21 2204 02/26/21 0630 02/27/21 0026 02/28/21 0021 03/01/21 0023  NA 138 139 137 134* 134*  K 3.8 3.6 3.8 3.4* 3.5  CL 99  --  102 100 103  CO2 26  --  27 23 23   GLUCOSE 149*  --  121* 116* 98  BUN 11  --  6 8 10   CREATININE 0.71  --  0.52* 0.56* 0.53*  CALCIUM 9.3  --  9.0 8.6* 8.6*  MG  --   --  1.9 1.9  --   PHOS  --   --   --  2.8  --    GFR: Estimated Creatinine Clearance: 103.9 mL/min (A) (by C-G formula based on SCr of 0.53 mg/dL (L)). Recent Labs  Lab 02/25/21 2204 02/26/21 0241 02/26/21 0403 02/26/21 1215 02/27/21 0026 02/28/21 0021 03/01/21 0023  PROCALCITON  --   --   --   --  <0.10  --   --   WBC 29.4*  --   --   --  20.7* 17.9* 16.7*  LATICACIDVEN  --  1.6 3.7* 1.1  --   --   --     Liver Function Tests: Recent Labs  Lab 02/25/21 2204 02/27/21 0026  AST 21 21  ALT 28 19  ALKPHOS 103 75  BILITOT 1.1 1.0  PROT 7.1 5.9*  ALBUMIN 3.6 3.0*   Recent Labs  Lab  02/25/21 2204  LIPASE 18   No results for input(s): AMMONIA in the last 168 hours.  ABG    Component Value Date/Time   PHART 7.458 (H) 02/26/2021 0630   PCO2ART 40.5 02/26/2021 0630   PO2ART 79 (L) 02/26/2021 0630   HCO3 28.7 (H) 02/26/2021 0630   TCO2 30 02/26/2021 0630   ACIDBASEDEF 1.3 01/18/2021 1504   O2SAT 96.0 02/26/2021 0630     Coagulation Profile: Recent Labs  Lab 02/25/21 2204 02/27/21 0026  INR 1.1 1.1    Cardiac Enzymes: No results for input(s): CKTOTAL, CKMB, CKMBINDEX, TROPONINI in the last 168 hours.  HbA1C: No results found for: HGBA1C  CBG: Recent Labs  Lab 02/26/21 1101 02/26/21 1700 02/26/21 2041  GLUCAP 135* 147* 135*

## 2021-03-02 NOTE — Progress Notes (Signed)
Brief Progress Note  I have reviewed 6/24  labs. K is 3.0, Na is 132 Mag is 2 WBC is 17.1, HGB 12 T max 99.7  Lasix 40 mg given x 1, Good diuresis Additional K of 40 meq given prior to Lasix  Plan Will check CXR in am Will give an additional 40 MEq of potassium now  Checking Mag in am , will replace as needed. Dr. Elsworth Soho added Steroids  Magdalen Spatz, MSN, AGACNP-BC [place] for personal pager PCCM on call pager [phone]  03/02/2021

## 2021-03-02 NOTE — Plan of Care (Signed)
  Problem: Education: Goal: Knowledge of General Education information will improve Description: Including pain rating scale, medication(s)/side effects and non-pharmacologic comfort measures Outcome: Progressing   Problem: Health Behavior/Discharge Planning: Goal: Ability to manage health-related needs will improve Outcome: Progressing   Problem: Clinical Measurements: Goal: Ability to maintain clinical measurements within normal limits will improve Outcome: Progressing   Problem: Clinical Measurements: Goal: Diagnostic test results will improve Outcome: Progressing   Problem: Clinical Measurements: Goal: Respiratory complications will improve Outcome: Progressing   Problem: Activity: Goal: Risk for activity intolerance will decrease Outcome: Progressing   Problem: Nutrition: Goal: Adequate nutrition will be maintained Outcome: Progressing   Problem: Pain Managment: Goal: General experience of comfort will improve Outcome: Progressing   Problem: Skin Integrity: Goal: Risk for impaired skin integrity will decrease Outcome: Progressing   

## 2021-03-03 ENCOUNTER — Inpatient Hospital Stay (HOSPITAL_COMMUNITY): Payer: 59

## 2021-03-03 DIAGNOSIS — J9601 Acute respiratory failure with hypoxia: Secondary | ICD-10-CM

## 2021-03-03 DIAGNOSIS — J90 Pleural effusion, not elsewhere classified: Secondary | ICD-10-CM

## 2021-03-03 LAB — CBC
HCT: 32 % — ABNORMAL LOW (ref 39.0–52.0)
Hemoglobin: 10.9 g/dL — ABNORMAL LOW (ref 13.0–17.0)
MCH: 30.4 pg (ref 26.0–34.0)
MCHC: 34.1 g/dL (ref 30.0–36.0)
MCV: 89.4 fL (ref 80.0–100.0)
Platelets: 361 10*3/uL (ref 150–400)
RBC: 3.58 MIL/uL — ABNORMAL LOW (ref 4.22–5.81)
RDW: 14.3 % (ref 11.5–15.5)
WBC: 13.4 10*3/uL — ABNORMAL HIGH (ref 4.0–10.5)
nRBC: 0 % (ref 0.0–0.2)

## 2021-03-03 LAB — CULTURE, BLOOD (ROUTINE X 2)
Culture: NO GROWTH
Culture: NO GROWTH
Special Requests: ADEQUATE
Special Requests: ADEQUATE

## 2021-03-03 LAB — BASIC METABOLIC PANEL
Anion gap: 9 (ref 5–15)
BUN: 18 mg/dL (ref 6–20)
CO2: 27 mmol/L (ref 22–32)
Calcium: 9.2 mg/dL (ref 8.9–10.3)
Chloride: 101 mmol/L (ref 98–111)
Creatinine, Ser: 0.61 mg/dL (ref 0.61–1.24)
GFR, Estimated: >60 mL/min (ref >60)
Glucose, Bld: 191 mg/dL — ABNORMAL HIGH (ref 70–99)
Potassium: 4.1 mmol/L (ref 3.5–5.1)
Sodium: 137 mmol/L (ref 135–145)

## 2021-03-03 LAB — MAGNESIUM: Magnesium: 2.3 mg/dL (ref 1.7–2.4)

## 2021-03-03 IMAGING — DX DG CHEST 1V PORT
1 series · 1 of 1 positions shown · non-contrast
Comparison: [DATE] and older exams.

CLINICAL DATA: 59-year-old male with history of HTN, stage III
adenocarcinoma of lung, depression, recent admission [DATE]-[DATE]
during which he underwent right upper lobe lobectomy and chest wall
dissection(VATS) by Dr. LOCKLEAR, presented with severe cough,
bright red hemoptysis, shortness of breath.Chest CT revealed diffuse
infiltrates concerning for alveolar hemorrhage with possible
superimposed infectious process/pulmonary edema. Patient was started
on broad-spectrum antibiotics.

EXAM:
PORTABLE CHEST 1 VIEW

[chest]
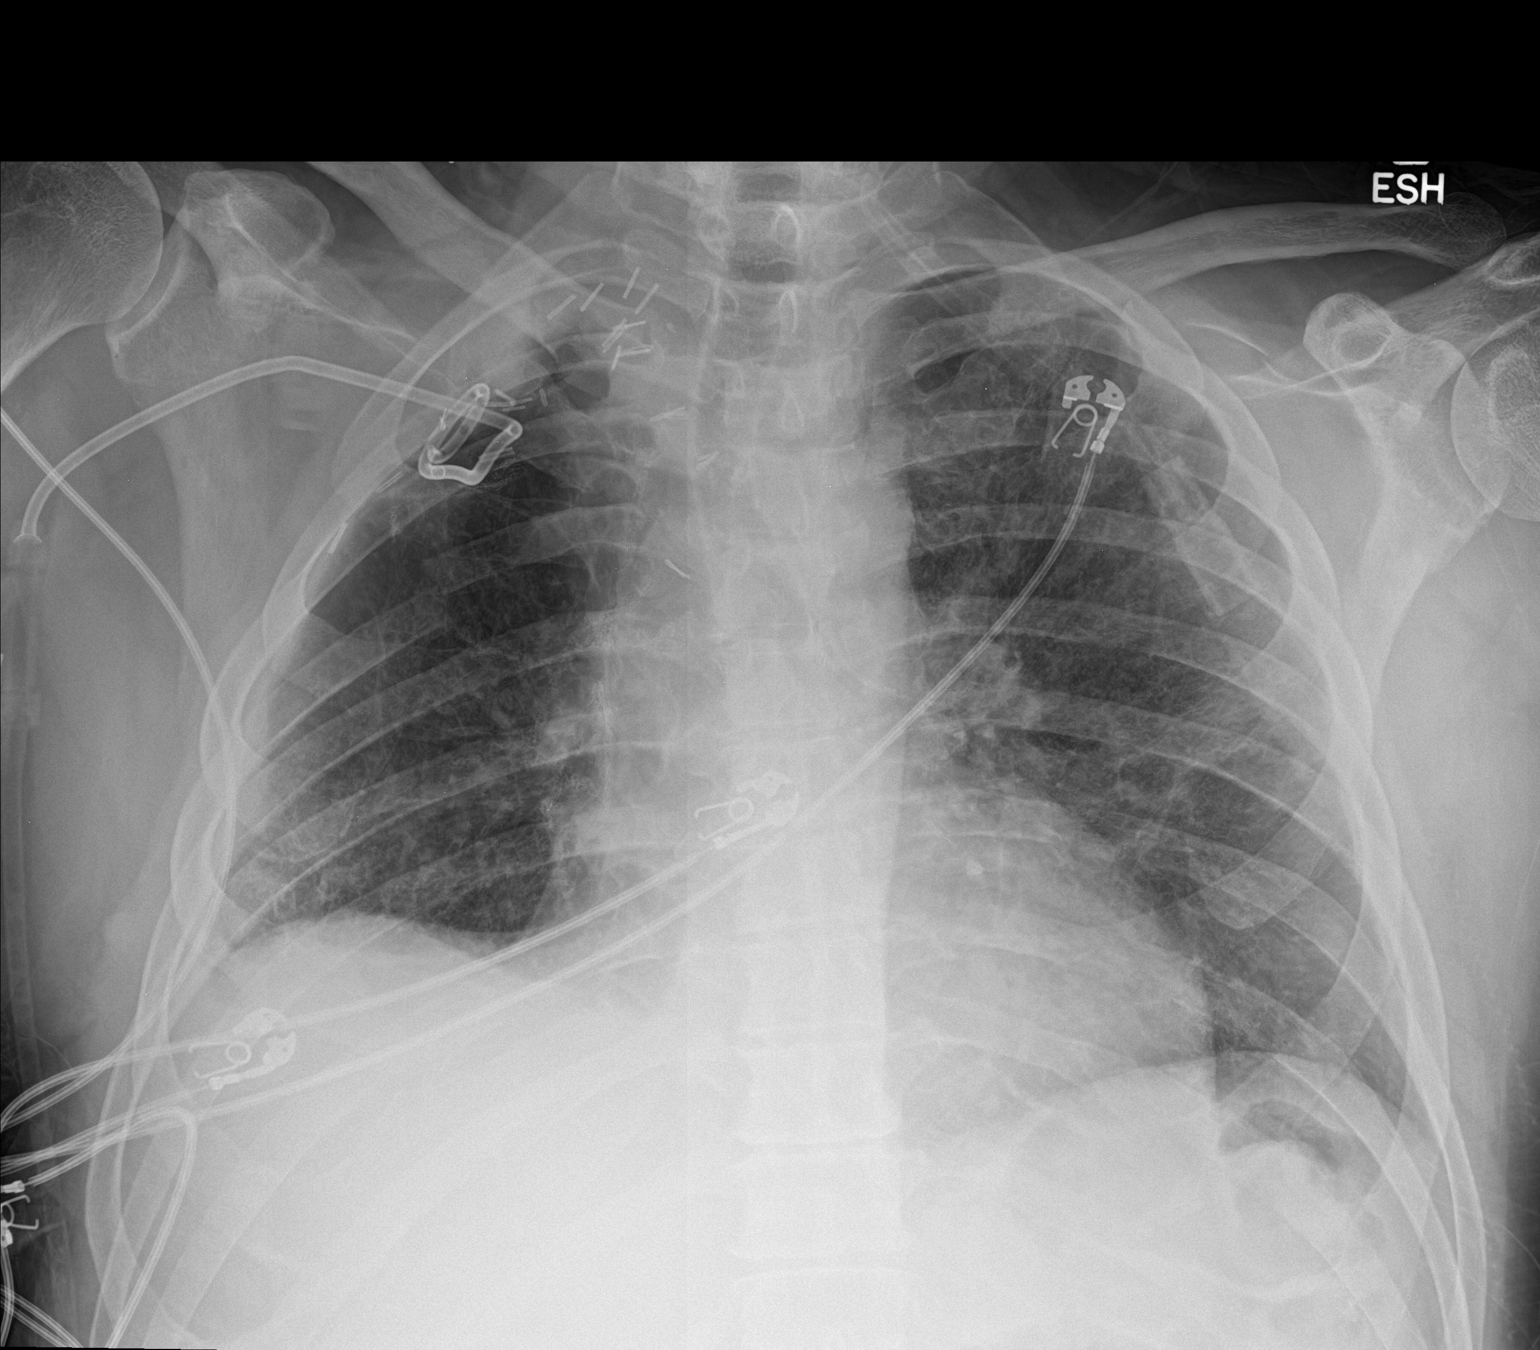

[1 of 1 positions shown; findings below may reference images not displayed]

FINDINGS: Postsurgical changes at the right lung apex are stable as is the
small caliber right upper hemithorax chest tube.

Interstitial lung opacities are similar to the prior study. Hazy
airspace opacities noted previously have improved. No new lung
abnormalities.

No convincing pneumothorax or pleural effusion.
IMPRESSION: 1. Improved airspace lung opacities compared to exams dating back to
[DATE]. Mild persistent interstitial lung opacities. No new lung
abnormalities.
2. Stable post surgical changes in the right upper hemithorax. No
pneumothorax.

## 2021-03-03 MED ORDER — IPRATROPIUM-ALBUTEROL 0.5-2.5 (3) MG/3ML IN SOLN
3.0000 mL | Freq: Three times a day (TID) | RESPIRATORY_TRACT | Status: DC
  Administered 2021-03-03 – 2021-03-06 (×10): 3 mL via RESPIRATORY_TRACT
  Filled 2021-03-03 (×9): qty 3

## 2021-03-03 NOTE — Progress Notes (Signed)
Patient pigtail chest tube removed without difficulty.  Transparent film of tagaderm applied over the site. Patient tolerated well

## 2021-03-03 NOTE — Progress Notes (Addendum)
NAME:  Christopher Carter, MRN:  [id number], DOB:  May 18, 1961, LOS: 5 ADMISSION DATE:  02/25/2021, CONSULTATION DATE:  6/20 REFERRING MD:  Dwyane Dee, CHIEF COMPLAINT:  respiratory failure and hemoptysis    History of Present Illness:  This is a 60 year old non-smoker.  Recently status post right upper lobe lobectomy with node dissection 5/16 for T4, N0, stage IIIa adenocarcinoma.  Surgical notes stated there was still residual tumor posterior near spine which was unable to be resected.  He is yet to undergo chemo or radiation.  Had been recovering slowly with expected postoperative pain.  Started to note decreased energy, more fatigue, overall malaise sometime around 6/15.  Had been seen the day prior by cardiothoracic's who noted loculated right upper lobe collection from surgical site for which plan CT-guided aspiration was planned for 6/21.  On 6/19 he continued to feel weak and fatigued, around midday he began to cough up blood.  Noting about a tablespoon at a time, reporting it is being bright red, and at its worst up to 6 times an hour.  At sometimes the cough would be so intense that it would make him nauseated and he would also vomit because of this, and worsening shortness of breath he reported to the emergency room around 8 PM on 6/19 .  In the ER he was found to be hypoxic requiring titration of oxygen up to 100% on heated high flow.  Initial CT imaging on 6/20 was negative for pulmonary embolus, there is a loculated moderate size right apical pleural fluid collection, diffuse bilateral airspace disease, and subacute right fifth and nondisplaced sixth rib fractures secondary to prior "thoracotomy.  He was admitted by the internal medicine service, cardiothoracic surgery is already consulted, because of the supplemental oxygen need pulmonary asked to evaluate  Pertinent  Medical History  Hypertension, depression.  Right upper lobe lung mass status post thoracotomy and right upper lobectomy with node  dissection 5/16.  Mass noted to be invading the chest wall with positive margins posteriorly near the spine.  He is T4, N0, stage IIIa adenocarcinoma.  Surgical notes document residual tumor posteriorly near the spine which was unable to be resected.  He has yet to undergo adjuvant radiation or chemo Last seen in cardiothoracic clinic on 6/17 at which time imaging noted to see a new loculated right upper lobe fluid collection and there was plan to proceed with CT-guided aspiration Significant Hospital Events: Including procedures, antibiotic start and stop dates in addition to other pertinent events   6/19 admitted with new hemoptysis,, fatigue, and worsening shortness of breath.  Found to be hypoxic on arrival requiring titration of supplemental oxygen up to heated high flow.  Started on azithromycin, ceftriaxone, and vancomycin  6/20   CT imaging obtained showing previously identified loculated right apical pleural effusion, negative for pulmonary emboli.  There is diffuse alveolar airspace disease.  Pulmonary asked to evaluate.  Interventional radiology consulted 6/20 IR placed anterior chestube into loculated effusion 6/24 started steroids  Interim History / Subjective:   Afebrile, complains of diaphoresis. Breathing better Down to 60%, 25 L high flow nasal cannula Objective   Blood pressure (!) 143/78, pulse 81, temperature 99 F (37.2 C), temperature source Oral, resp. rate 14, height $RemoveBe'5\' 10"'yxQFWFcmM$  (1.778 m), weight 83.9 kg, SpO2 95 %.    FiO2 (%):  [60 %-70 %] 60 %   Intake/Output Summary (Last 24 hours) at 03/03/2021 0859 Last data filed at 03/03/2021 0800 Gross per 24 hour  Intake 1080.06  ml  Output 1410 ml  Net -329.94 ml    Filed Weights   02/26/21 0208  Weight: 83.9 kg    Examination: General: acutely ill appearing male on HHF oxygen  HENT: MM  pink and moist, No LAD Lungs: No accessory muscle use, scattered rhonchi on right, no crackles Cardiovascular: S1-S2 regular, no  murmur Abdomen: Soft not tender , ND, BS +,no organomegaly Extremities: Warm dry with brisk capillary refill, no edema Neuro: Awake oriented no focal deficits, anxious  Labs/imaging that I havepersonally reviewed  (right click and "Reselect all SmartList Selections" daily)   Chest x-ray 6/25 independently reviewed-right loculated effusion is resolved, decreased lung volume on right, improved interstitial infiltrates  Labs show decrease leukocytosis, slight drop in hemoglobin from 12-11, normal electrolytes  Resolved Hospital Problem list     Assessment & Plan:  Acute hypoxic respiratory failure (POA) in the setting of diffuse pulmonary infiltrates with associated hemoptysis -Concern for alveolar hemorrhage versus aspiration induced damage  -s/p chest tube placement, RUL anterior bloody fluid collection - Remains on 20-25 L High flow oxygen, but gradually improving hypoxia Plan -Plan for 7 days of cefepime, decreasing leukocytosis -Continue to dial down FiO2 -Empiric Solu-Medrol 40 every 12   right upper lobe loculated pleural effusion --No indication for intrapleural tPA , in fact chest tube can be discontinued now that drainage is decreased   Stage IIIa adenocarcinoma of the lung status post right upper lobe lobectomy, yet to undergo adjunctive therapy Plan Follow-up as outpatient , pleural involvement makes the stage IV Molecular markers show met 1 and PD-L1 expression  Mobilize at bedside to prevent deconditioning    Labs   CBC: Recent Labs  Lab 02/25/21 2204 02/26/21 0630 02/27/21 0026 02/28/21 0021 03/01/21 0023 03/02/21 1254 03/03/21 0024  WBC 29.4*  --  20.7* 17.9* 16.7* 17.1* 13.4*  NEUTROABS 26.0*  --  17.2*  --  13.0* 13.7*  --   HGB 13.7   < > 11.6* 11.2* 11.7* 12.0* 10.9*  HCT 40.2   < > 34.1* 34.4* 34.8* 35.2* 32.0*  MCV 90.1  --  91.9 92.2 91.3 89.8 89.4  PLT 473*  --  378 345 332 366 361   < > = values in this interval not displayed.      Basic Metabolic Panel: Recent Labs  Lab 02/27/21 0026 02/28/21 0021 03/01/21 0023 03/02/21 1254 03/03/21 0024  NA 137 134* 134* 132* 137  K 3.8 3.4* 3.5 3.0* 4.1  CL 102 100 103 97* 101  CO2 $Re'27 23 23 25 27  'TrR$ GLUCOSE 121* 116* 98 177* 191*  BUN $Re'6 8 10 10 18  'sYE$ CREATININE 0.52* 0.56* 0.53* 0.52* 0.61  CALCIUM 9.0 8.6* 8.6* 8.5* 9.2  MG 1.9 1.9  --  2.0 2.3  PHOS  --  2.8  --   --   --     GFR: Estimated Creatinine Clearance: 103.9 mL/min (by C-G formula based on SCr of 0.61 mg/dL). Recent Labs  Lab 02/26/21 0241 02/26/21 0403 02/26/21 1215 02/27/21 0026 02/28/21 0021 03/01/21 0023 03/02/21 1254 03/03/21 0024  PROCALCITON  --   --   --  <0.10  --   --   --   --   WBC  --   --   --  20.7* 17.9* 16.7* 17.1* 13.4*  LATICACIDVEN 1.6 3.7* 1.1  --   --   --   --   --      Liver Function Tests: Recent Labs  Lab 02/25/21  2204 02/27/21 0026  AST 21 21  ALT 28 19  ALKPHOS 103 75  BILITOT 1.1 1.0  PROT 7.1 5.9*  ALBUMIN 3.6 3.0*    Recent Labs  Lab 02/25/21 2204  LIPASE 18    No results for input(s): AMMONIA in the last 168 hours.  ABG    Component Value Date/Time   PHART 7.458 (H) 02/26/2021 0630   PCO2ART 40.5 02/26/2021 0630   PO2ART 79 (L) 02/26/2021 0630   HCO3 28.7 (H) 02/26/2021 0630   TCO2 30 02/26/2021 0630   ACIDBASEDEF 1.3 01/18/2021 1504   O2SAT 96.0 02/26/2021 0630      Coagulation Profile: Recent Labs  Lab 02/25/21 2204 02/27/21 0026  INR 1.1 1.1     Cardiac Enzymes: No results for input(s): CKTOTAL, CKMB, CKMBINDEX, TROPONINI in the last 168 hours.  HbA1C: No results found for: HGBA1C  CBG: Recent Labs  Lab 02/26/21 1101 02/26/21 1700 02/26/21 2041  [place] MD. FCCP. [facility] Pager : [phone]  If no response to pager , please call [phone] until 7 pm After 7:00 pm call Elink  [phone]   03/03/2021

## 2021-03-03 NOTE — Progress Notes (Signed)
Pharmacy Antibiotic Note  Christopher Carter is a 60 y.o. male admitted on 02/25/2021 with  sepsis, r/o pleural effusion .  Pharmacy has been consulted for Cefepime dosing.  ID: Sepsis, concern pleural effusion - WBC 29>17>13.4, LA 3.7, Tmax 99.6, PCT <0.1, Scr <1. -R CT placed for loculated hemothorax 6/21 (-750 cc removed)  Vancomycin 6/20>> 6/22 Cefepime 6/20>> Azithromycin 6/20>> 6/21 Ceftriaxone 6/20 x1   Bcx 6/20: neg 6/20: pleural fluid: NGD MRSA PCR 6/20: neg   Plan: -Cefepime 2g IV every 8 hours - planning 8d course per Shearon Stalls (would end Monday 6/27)--no stop date entered yet Pharmacy will sign off. Please reconsult for further dosing assitance.     Height: 5\' 10"  (177.8 cm) Weight: 83.9 kg (185 lb) IBW/kg (Calculated) : 73  Temp (24hrs), Avg:98.8 F (37.1 C), Min:97.9 F (36.6 C), Max:99.6 F (37.6 C)  Recent Labs  Lab 02/26/21 0241 02/26/21 0403 02/26/21 1215 02/27/21 0026 02/28/21 0021 03/01/21 0023 03/02/21 1254 03/03/21 0024  WBC  --   --   --  20.7* 17.9* 16.7* 17.1* 13.4*  CREATININE  --   --   --  0.52* 0.56* 0.53* 0.52* 0.61  LATICACIDVEN 1.6 3.7* 1.1  --   --   --   --   --     Estimated Creatinine Clearance: 103.9 mL/min (by C-G formula based on SCr of 0.61 mg/dL).    Allergies  Allergen Reactions   Oxycodone Hcl Other (See Comments)    Pt reported "seeing things" and " feeling unusual."   Bupropion Nausea And Vomiting   Marlynn Hinckley S. Alford Highland, PharmD, BCPS Clinical Staff Pharmacist [email or web address]  Wayland Salinas 03/03/2021 3:10 PM

## 2021-03-03 NOTE — Plan of Care (Signed)

## 2021-03-03 NOTE — Progress Notes (Signed)
PROGRESS NOTE    Christopher Carter  JEH:[id number] DOB: 1960/11/12 DOA: 02/25/2021 PCP: [name], MD   Brief Narrative:  Patient is a 60 year old male with history of HTN, stage III adenocarcinoma of lung, depression, recent admission 5/16-5/20 during which he underwent right upper lobe lobectomy and chest wall dissection(VATS) by Dr. Roxan Hockey, presented with severe cough, bright red hemoptysis, shortness of breath.   Chest CT revealed diffuse infiltrates concerning for alveolar hemorrhage with possible superimposed infectious process/pulmonary edema.   Patient was started on broad-spectrum antibiotics. PCCM/IR consulted and he underwent right-sided chest tube placement for hemothorax.   Cardiothoracic surgery also consulting.   Patient currently being managed for acute hypoxic respiratory failure requiring high flow oxygen, possible superimposed pneumonia   Assessment & Plan:   Principal Problem:   Acute respiratory failure with hypoxia (HCC) Active Problems:   Essential hypertension   Pulmonary alveolar hemorrhage   Adenocarcinoma of right lung (HCC)   Pneumonia of right lung due to infectious organism   Sepsis due to pneumonia ([place])   Major depressive disorder   Acute hypoxic respiratory failure present on admission. Right upper lobe loculated pleural effusion Hemothorax concern for alveolar hemorrhage Recent right upper lobectomy for stage III adenocarcinoma. Severe sepsis secondary to healthcare associated pneumonia Presented with hemoptysis, cough, shortness of breath requiring supplemental oxygen.  Met SIRS criteria on admission with tachycardia, tachypnea, leukocytosis.  Endorgan damage with lactic acidosis. His hypoxia worsened during the hospitalization and currently he is on high flow oxygen 25L 60%-CONTINUING TO TRY AND WEAN FOR POSSIBLE BRONCHOSCOPY Acute hypoxic respite failure is multifactorial secondary to progressive alveolar hemorrhage, possible  superimposed pneumonia.  Pigtail chest tube placement by IR on 5/21. Cardiothoracic surgery consulted. Pulmonary consulted as well.  Patient may require bronchoscopy Started on broad-spectrum IV biotics, currently on IV cefepime-PLAN FOR 7 days Cultures so far negative without any growth. Leukocytosis improving. Chest x-ray on 6/24 shows increasing congestion, started on IV Lasix x1 with significant improvement respiratory status. Also IV steroids were added 6-24 to aid in healing. Monitoring PCCM and cardiothoracic surgery recommendation. CT in place, pulm evaluating for discontinuation    Pain control. Anxiety Remains to be one of the main issue, but on my eval this AM appears to be improving No improvement with IV fentanyl. Oral Dilaudid was added but continues to remain with severe pain. Currently Toradol, gabapentin dose increased, Dilaudid dose increased, continue fentanyl. May benefit from being on benzodiazepine.   Lactic acidosis:  Resolved with IV fluids   Accelerated hypertension Currently blood pressure elevated secondary to pain and anxiety On Avapro, amlodipine   History of depression:  Continue home medications   Adenocarcinoma of the right lung:  Diagnosed in April 2022, follows with Dr. Julien Nordmann.  Status post recent VATS with right upper lobe lobectomy and chest wall dissection by cardiothoracic surgery, Dr. Koleen Nimrod. Needs outpatient follow-up.   Hypokalemia:  Supplemented with potassium.  Insomnia. Ambien.   Body mass index is 26.54 kg/m.    Diet: Regular diet DVT Prophylaxis:   SCDs Start: 02/26/21 0612  DVT prophylaxis: SCD/Compression stockings  Code Status: full    Code Status Orders  (From admission, onward)           Start     Ordered   02/26/21 0612  Full code  Continuous        02/26/21 0613           Code Status History     Date Active Date Inactive Code  Status Order ID Comments User Context   01/22/2021 1529 01/26/2021  1518 Full Code [id number]  Elgie Collard PA-C Inpatient      Family Communication: wife at bedside  Disposition Plan:   TBD, no clinically stable for d/c Consults called:  cards pccm Admission status: Inpatient Status is: Inpatient   Remains inpatient appropriate because:Inpatient level of care appropriate due to severity of illness   Dispo: The patient is from: Home              Anticipated d/c is to: Home              Patient currently is not medically stable to d/c.              Difficult to place patient No  Consultants:  As above  Procedures:  DG Chest 2 View  Result Date: 02/25/2021 CLINICAL DATA:  History of right partial lobectomy with hemoptysis and increasing shortness of breath EXAM: CHEST - 2 VIEW COMPARISON:  02/20/2021 FINDINGS: Cardiac shadow is stable. Left lung remains clear. Right lung demonstrates right-sided pleural effusion and masslike density superiorly with postsurgical changes. This represents pleural fluid loculated from prior resection and stable in appearance. Some increasing parenchymal density is noted throughout the aerated lung which may represent hemorrhage given the history of hemoptysis. No new mass lesion is seen. IMPRESSION: Stable masslike density in the right apex consisting of loculated fluid following recent surgery. Increasing interstitial opacity within the right lung which may represent some hemorrhage given the hemoptysis. Small basilar right sided effusion. Electronically Signed   By: Inez Catalina M.D.   On: 02/25/2021 22:27   DG Chest 2 View  Result Date: 02/20/2021 CLINICAL DATA:  Right upper lobe mass EXAM: CHEST - 2 VIEW COMPARISON:  02/13/2021 FINDINGS: Cardiac shadow is stable. Volume loss on the right is noted with small effusions stable from the prior exam. Postoperative changes are noted in the right apex with persistent soft tissue mass lesion stable from the prior exam. Left lung remains clear. Posterior fracture of the right fifth  rib is noted stable from previous exam. IMPRESSION: Stable right upper lobe mass lesion.  Postsurgical changes are seen. Electronically Signed   By: Inez Catalina M.D.   On: 02/20/2021 14:32   DG Chest 2 View  Result Date: 02/13/2021 CLINICAL DATA:  Status post thoracotomy and VATS procedure EXAM: CHEST - 2 VIEW COMPARISON:  Jan 26, 2021 FINDINGS: There is a masslike area in the right upper lobe extending to the apex with surgical clips in this area. This masslike area measures 8.0 x 8.1 cm. There is a right pleural effusion with volume loss on the right. Left lung is clear. Heart size and pulmonary vascularity normal. No adenopathy no bone lesions. IMPRESSION: 8.1 x 8.0 cm masslike area right upper lobe toward the apex. Suspect postoperative loculated fluid hematoma in this area is most likely etiology given the dramatic change compared to the study from approximately 2 weeks prior. There is a small right pleural effusion there is right base volume loss. Left lung is clear. Heart size normal. Electronically Signed   By: Lowella Grip III M.D.   On: 02/13/2021 14:31   CT CHEST WO CONTRAST  Result Date: 02/16/2021 CLINICAL DATA:  History of right upper lobe lung cancer status post right upper lobe lobectomy. Follow-up postoperative right apical fluid collection. EXAM: CT CHEST WITHOUT CONTRAST TECHNIQUE: Multidetector CT imaging of the chest was performed following the standard protocol without IV contrast.  COMPARISON:  PET-CT 12/13/2020 and chest x-ray 02/13/2021 FINDINGS: Cardiovascular: The heart is normal in size peer no pericardial effusion. Stable age advanced coronary artery calcifications. No definite aortic calcifications. Mediastinum/Nodes: Stable scattered sub 8 mm mediastinal lymph nodes. No new or progressive findings. The esophagus is grossly normal. Lungs/Pleura: Surgical changes from a right upper lobe lobectomy. There is a moderate-sized right pleural effusion with loculated fluid at the  right lung apex. Small amount of residual pleural air is also noted. No worrisome pulmonary nodules to suggest pulmonary metastatic disease. Upper Abdomen: No significant upper abdominal findings. No hepatic or adrenal gland lesions are identified. Musculoskeletal: No significant bony findings. IMPRESSION: 1. Surgical changes from a right upper lobe lobectomy. 2. Moderate-sized right pleural effusion with loculated fluid at the right lung apex. 3. No worrisome pulmonary nodules to suggest pulmonary metastatic disease. 4. Stable age advanced coronary artery calcifications. Aortic Atherosclerosis (ICD10-I70.0) Electronically Signed   By: Marijo Sanes M.D.   On: 02/16/2021 16:27   CT Angio Chest PE W and/or Wo Contrast  Result Date: 02/26/2021 CLINICAL DATA:  hx of R partial lobectomy, has been coughing up blood since about noon, increased shortness of breath, denies home 02, now on 3L [place]. Pulmonary embolus suspected. VATS and thoracotomy surgery 01/22/2021 EXAM: CT ANGIOGRAPHY CHEST WITH CONTRAST TECHNIQUE: Multidetector CT imaging of the chest was performed using the standard protocol during bolus administration of intravenous contrast. Multiplanar CT image reconstructions and MIPs were obtained to evaluate the vascular anatomy. CONTRAST:  51m OMNIPAQUE IOHEXOL 350 MG/ML SOLN COMPARISON:  CT chest 02/16/2021 FINDINGS: Cardiovascular: Satisfactory opacification of the pulmonary arteries to the segmental level. No evidence of pulmonary embolism. Normal heart size. No pericardial effusion. The thoracic aorta is normal in caliber. Coronary artery calcifications are noted. Mediastinum/Nodes: No enlarged mediastinal, hilar, or axillary lymph nodes. Thyroid gland, trachea, and esophagus demonstrate no significant findings. Lungs/Pleura: Surgical changes related to a partial right lobectomy of the right upper lobe with associated loculated right apical moderate-sized pleural effusion. Persistent trace basilar pleural  effusion. Diffuse, right greater than left as well as lower lobe greater than upper lobe, peribronchovascular ground-glass and consolidative opacities. Some peripheral airspace opacities are also noted. Upper Abdomen: No acute abnormality. Musculoskeletal: No chest wall abnormality. No suspicious lytic or blastic osseous lesions. No acute displaced fracture. Redemonstration of subacute right comminuted fifth as well as right nondisplaced sixth rib fractures that are broken in 2 separate places. Multilevel degenerative changes of the spine. Review of the MIP images confirms the above findings. IMPRESSION: 1. No pulmonary embolus. 2. Diffuse pulmonary findings findings suggestive of alveolar hemorrhage versus infection/inflammation versus pulmonary edema. 3. Similar-appearing loculated moderate sized right apical pleural effusion with limited evaluation due to timing of intravenous contrast. Persistent trace right basilar pleural effusion. 4. Subacute right comminuted fifth as well as right nondisplaced sixth rib fractures that are broken in 2 separate places consistent with history of thoracotomy. 5. Coronary artery calcifications. Electronically Signed   By: MIven FinnM.D.   On: 02/26/2021 04:59   DG Chest Port 1 View  Result Date: 03/03/2021 CLINICAL DATA:  60year old male with history of HTN, stage III adenocarcinoma of lung, depression, recent admission 5/16-5/20 during which he underwent right upper lobe lobectomy and chest wall dissection(VATS) by Dr. HRoxan Hockey presented with severe cough, bright red hemoptysis, shortness of breath.Chest CT revealed diffuse infiltrates concerning for alveolar hemorrhage with possible superimposed infectious process/pulmonary edema. Patient was started on broad-spectrum antibiotics. EXAM: PORTABLE CHEST 1 VIEW COMPARISON:  03/02/2021 and older exams. FINDINGS: Postsurgical changes at the right lung apex are stable as is the small caliber right upper hemithorax  chest tube. Interstitial lung opacities are similar to the prior study. Hazy airspace opacities noted previously have improved. No new lung abnormalities. No convincing pneumothorax or pleural effusion. IMPRESSION: 1. Improved airspace lung opacities compared to exams dating back to 02/26/2021. Mild persistent interstitial lung opacities. No new lung abnormalities. 2. Stable post surgical changes in the right upper hemithorax. No pneumothorax. Electronically Signed   By: Lajean Manes M.D.   On: 03/03/2021 08:15   DG Chest Port 1 View  Result Date: 03/02/2021 CLINICAL DATA:  VATS/right upper lobectomy.  Right chest tube. EXAM: PORTABLE CHEST 1 VIEW COMPARISON:  03/01/2021 FINDINGS: Stable position of the right apical chest tube. Residual pleural based densities at the right lung apex with postsurgical changes. Stable volume loss in the right hemithorax. Hazy interstitial lung densities bilaterally that have minimally changed. Heart size is stable. IMPRESSION: 1. Stable positioning of the right apical small bore chest tube. Aeration at the right lung apex has markedly improved since chest tube placement but there are residual densities in this area. Negative for a large pneumothorax. 2. Diffuse interstitial lung densities that could represent interstitial pulmonary edema. Findings are similar to the recent comparison examination. Electronically Signed   By: Markus Daft M.D.   On: 03/02/2021 11:04   DG Chest Port 1 View  Result Date: 03/01/2021 CLINICAL DATA:  Status post right lung lobectomy. EXAM: PORTABLE CHEST 1 VIEW COMPARISON:  02/28/2021.  02/27/2021. FINDINGS: Right chest tube in stable position. Stable postsurgical changes right apex. Tiny residual right apical pneumothorax cannot be completely excluded. Bilateral interstitial infiltrates/edema, slightly progressed on the left from prior exam. No prominent pleural effusion. Heart size stable. IMPRESSION: 1. Right apical chest tube in stable position.  Stable postsurgical changes right apex. Tiny residual right apical pneumothorax cannot be completely excluded. 2. Bilateral interstitial infiltrates/edema slightly progressed on the left from prior exam. Electronically Signed   By: [place]   On: 03/01/2021 08:36   DG Chest Port 1 View  Result Date: 02/28/2021 CLINICAL DATA:  Chest tube.  Shortness of breath. EXAM: PORTABLE CHEST 1 VIEW COMPARISON:  02/27/2021. FINDINGS: Surgical clips right chest. Right chest tube in stable position over the right apex. Stable right apical pleural thickening and tiny pneumothorax noted. Stable right lateral pleural thickening. Bilateral pulmonary infiltrates/edema, improved from prior exam. No acute bony abnormality. IMPRESSION: 1. Surgical clips right chest. Right chest tube in stable position over the right apex. Stable right apical pleural thickening and tiny pneumothorax noted. Stable right lateral pleural thickening. 2.  Bilateral pulmonary infiltrates/edema, improved from prior exam. Electronically Signed   By: [place]   On: 02/28/2021 09:50   DG Chest Port 1 View  Result Date: 02/27/2021 CLINICAL DATA:  New hemoptysis, fatigue, worsening shortness of breath, hypoxia EXAM: PORTABLE CHEST 1 VIEW COMPARISON:  Portable exam 0835 hours compared to 02/25/2021 FINDINGS: Pigtail thoracostomy tube at RIGHT apex with decrease in loculated pleural effusion. Small loculated pneumothorax at RIGHT apex. Stable heart size and mediastinal contours. BILATERAL pulmonary infiltrates in the mid to lower lungs, increased on LEFT since previous exam. No acute osseous findings. IMPRESSION: Small residual loculated hydropneumothorax at RIGHT apex post chest tube insertion. BILATERAL mid to lower lung pulmonary infiltrates, slightly increased. Electronically Signed   By: Lavonia Dana M.D.   On: 02/27/2021 11:20   CT IMAGE GUIDED DRAINAGE BY PERCUTANEOUS  CATHETER  Result Date: 02/26/2021 INDICATION: 60 year old male with a  history recent surgery, presentation with hemoptysis, and loculated fluid of the surgery site. He has been referred for CT-guided chest tube placement EXAM: CT-GUIDED CHEST TUBE MEDICATIONS: The patient is currently admitted to the hospital and receiving intravenous antibiotics. The antibiotics were administered within an appropriate time frame prior to the initiation of the procedure. ANESTHESIA/SEDATION: 1.0 mg IV Versed 25 mcg IV Fentanyl Moderate Sedation Time:  16 minutes The patient was continuously monitored during the procedure by the interventional radiology nurse under my direct supervision. COMPLICATIONS: None TECHNIQUE: Informed written consent was obtained from the patient after a thorough discussion of the procedural risks, benefits and alternatives. All questions were addressed. Maximal Sterile Barrier Technique was utilized including caps, mask, sterile gowns, sterile gloves, sterile drape, hand hygiene and skin antiseptic. A timeout was performed prior to the initiation of the procedure. PROCEDURE: Patient was positioned in the supine position on the CT gantry table and a scout CT of the chest was performed for planning purposes. Once angle of approach was determined, the skin and subcutaneous tissues this scan was prepped and draped in the usual sterile fashion, and a sterile drape was applied covering the operative field. A sterile gown and sterile gloves were used for the procedure. Local anesthesia was provided with 1% Lidocaine. The skin and subcutaneous tissues were infiltrated 1% lidocaine for local anesthesia, and a small stab incision was made with an 11 blade scalpel. Using CT guidance, a 18 gauge trocar needle was advanced into the pleural space of the right apex. After confirmation of the tip, modified Seldinger technique was used to place a 10 French pigtail drainage catheter. Approximately 60 cc of reddish thin fluid aspirated. Culture was sent to the lab for analysis. Catheter was  then attached to water seal chamber and suction was applied confirming a operational chest tube. Retention suture was placed.  Sterile dressing was placed. Patient tolerated the procedure well and remained hemodynamically stable throughout. No complications were encountered and no significant blood loss was encounter FINDINGS: The scout CT demonstrates essentially a similar distribution and volume of confluent ground-glass opacity of the right lower lobe, right middle lobe, and left lower lobe. No significant fluid identified within the airways. Similar appearance of the loculated fluid collection within the surgical site at the right apex. After placement of the chest tube approximately 60 cc of thin reddish fluid aspirated which appears to be serosanguineous. No frank abscess. Culture was sent. IMPRESSION: Status post CT-guided right apical chest tube into loculated fluid revealing serosanguineous fluid. Culture was sent. Signed, Dulcy Fanny. Dellia Nims, RPVI [facility] Electronically Signed   By: Corrie Mckusick D.O.   On: 02/26/2021 17:09    Antimicrobials:  cefepime   Subjective: Reports his breathing continues to improve  Objective: Vitals:   03/03/21 0720 03/03/21 0728 03/03/21 1130 03/03/21 1318  BP: (!) 143/78  (!) 142/79   Pulse: 86 81 99 88  Resp: 19 14 (!) 29 14  Temp: 99 F (37.2 C)  98.9 F (37.2 C)   TempSrc: Oral  Oral   SpO2: 94% 95% 92% 97%  Weight:      Height:        Intake/Output Summary (Last 24 hours) at 03/03/2021 1333 Last data filed at 03/03/2021 1200 Gross per 24 hour  Intake 1080.06 ml  Output 1410 ml  Net -329.94 ml   Filed Weights   02/26/21 0208  Weight:  83.9 kg    Examination:  General exam: Appears calm and comfortable  Respiratory system: Mild rales bilaterally, chest tube in place Cardiovascular system: S1 & S2 heard, RRR. No JVD, murmurs, rubs, gallops or clicks. No pedal  edema. Gastrointestinal system: Abdomen is nondistended, soft and nontender. No organomegaly or masses felt. Normal bowel sounds heard. Central nervous system: Alert and oriented. No focal neurological deficits. Extremities: Warm well perfused, no edema Skin: No rashes, lesions or ulcers Psychiatry: Judgement and insight appear normal. Mood & affect appropriate.  Although appears mildly anxious35   Data Reviewed: I have personally reviewed following labs and imaging studies  CBC: Recent Labs  Lab 02/25/21 2204 02/26/21 0630 02/27/21 0026 02/28/21 0021 03/01/21 0023 03/02/21 1254 03/03/21 0024  WBC 29.4*  --  20.7* 17.9* 16.7* 17.1* 13.4*  NEUTROABS 26.0*  --  17.2*  --  13.0* 13.7*  --   HGB 13.7   < > 11.6* 11.2* 11.7* 12.0* 10.9*  HCT 40.2   < > 34.1* 34.4* 34.8* 35.2* 32.0*  MCV 90.1  --  91.9 92.2 91.3 89.8 89.4  PLT 473*  --  378 345 332 366 361   < > = values in this interval not displayed.   Basic Metabolic Panel: Recent Labs  Lab 02/27/21 0026 02/28/21 0021 03/01/21 0023 03/02/21 1254 03/03/21 0024  NA 137 134* 134* 132* 137  K 3.8 3.4* 3.5 3.0* 4.1  CL 102 100 103 97* 101  CO2 _0 GLUCOSE 121* 116* 98 177* 191*  BUN _1 CREATININE 0.52* 0.56* 0.53* 0.52* 0.61  CALCIUM 9.0 8.6* 8.6* 8.5* 9.2  MG 1.9 1.9  --  2.0 2.3  PHOS  --  2.8  --   --   --    GFR: Estimated Creatinine Clearance: 103.9 mL/min (by C-G formula based on SCr of 0.61 mg/dL). Liver Function Tests: Recent Labs  Lab 02/25/21 2204 02/27/21 0026  AST 21 21  ALT 28 19  ALKPHOS 103 75  BILITOT 1.1 1.0  PROT 7.1 5.9*  ALBUMIN 3.6 3.0*   Recent Labs  Lab 02/25/21 2204  LIPASE 18   No results for input(s): AMMONIA in the last 168 hours. Coagulation Profile: Recent Labs  Lab 02/25/21 2204 02/27/21 0026  INR 1.1 1.1   Cardiac Enzymes: No results for input(s): CKTOTAL, CKMB, CKMBINDEX, TROPONINI in the last 168 hours. BNP (last 3 results) No results for  input(s): PROBNP in the last 8760 hours. HbA1C: No results for input(s): HGBA1C in the last 72 hours. CBG: Recent Labs  Lab 02/26/21 1101 02/26/21 1700 02/26/21 2041  GLUCAP 135* 147* 135*   Lipid Profile: No results for input(s): CHOL, HDL, LDLCALC, TRIG, CHOLHDL, LDLDIRECT in the last 72 hours. Thyroid Function Tests: No results for input(s): TSH, T4TOTAL, FREET4, T3FREE, THYROIDAB in the last 72 hours. Anemia Panel: No results for input(s): VITAMINB12, FOLATE, FERRITIN, TIBC, IRON, RETICCTPCT in the last 72 hours. Sepsis Labs: Recent Labs  Lab 02/26/21 0241 02/26/21 0403 02/26/21 1215 02/27/21 0026  PROCALCITON  --   --   --  <0.10  LATICACIDVEN 1.6 3.7* 1.1  --     Recent Results (from the past 240 hour(s))  Resp Panel by RT-PCR (Flu A&B, Covid) Nasopharyngeal Swab     Status: None   Collection Time: 02/26/21  2:13 AM   Specimen: Nasopharyngeal Swab; Nasopharyngeal(NP) swabs in vial transport medium  Result Value Ref Range Status   SARS Coronavirus  2 by RT PCR NEGATIVE NEGATIVE Final    Comment: (NOTE) SARS-CoV-2 target nucleic acids are NOT DETECTED.  The SARS-CoV-2 RNA is generally detectable in upper respiratory specimens during the acute phase of infection. The lowest concentration of SARS-CoV-2 viral copies this assay can detect is 138 copies/mL. A negative result does not preclude SARS-Cov-2 infection and should not be used as the sole basis for treatment or other patient management decisions. A negative result may occur with  improper specimen collection/handling, submission of specimen other than nasopharyngeal swab, presence of viral mutation(s) within the areas targeted by this assay, and inadequate number of viral copies(<138 copies/mL). A negative result must be combined with clinical observations, patient history, and epidemiological information. The expected result is Negative.  Fact Sheet for Patients:   EntrepreneurPulse.com.au  Fact Sheet for Healthcare Providers:  IncredibleEmployment.be  This test is no t yet approved or cleared by the [place] FDA and  has been authorized for detection and/or diagnosis of SARS-CoV-2 by FDA under an Emergency Use Authorization (EUA). This EUA will remain  in effect (meaning this test can be used) for the duration of the COVID-19 declaration under Section 564(b)(1) of the Act, 21 U.S.C.section 360bbb-3(b)(1), unless the authorization is terminated  or revoked sooner.       Influenza A by PCR NEGATIVE NEGATIVE Final   Influenza B by PCR NEGATIVE NEGATIVE Final    Comment: (NOTE) The Xpert Xpress SARS-CoV-2/FLU/RSV plus assay is intended as an aid in the diagnosis of influenza from Nasopharyngeal swab specimens and should not be used as a sole basis for treatment. Nasal washings and aspirates are unacceptable for Xpert Xpress SARS-CoV-2/FLU/RSV testing.  Fact Sheet for Patients: EntrepreneurPulse.com.au  Fact Sheet for Healthcare Providers: IncredibleEmployment.be  This test is not yet approved or cleared by the [place] FDA and has been authorized for detection and/or diagnosis of SARS-CoV-2 by FDA under an Emergency Use Authorization (EUA). This EUA will remain in effect (meaning this test can be used) for the duration of the COVID-19 declaration under Section 564(b)(1) of the Act, 21 U.S.C. section 360bbb-3(b)(1), unless the authorization is terminated or revoked.  Performed at [facility], [place] [address], [place], [place] [address]   Blood Culture (routine x 2)     Status: None   Collection Time: 02/26/21  2:59 AM   Specimen: BLOOD RIGHT FOREARM  Result Value Ref Range Status   Specimen Description BLOOD RIGHT FOREARM  Final   Special Requests   Final    BOTTLES DRAWN AEROBIC AND ANAEROBIC Blood Culture adequate volume   Culture   Final    NO  GROWTH 5 DAYS Performed at [facility], [place] [address] [place], [place] [address]    Report Status 03/03/2021 FINAL  Final  MRSA Next Gen by PCR, Nasal     Status: None   Collection Time: 02/26/21  6:04 AM   Specimen: Nasal Mucosa; Nasal Swab  Result Value Ref Range Status   MRSA by PCR Next Gen NOT DETECTED NOT DETECTED Final    Comment: (NOTE) The GeneXpert MRSA Assay (FDA approved for NASAL specimens only), is one component of a comprehensive MRSA colonization surveillance program. It is not intended to diagnose MRSA infection nor to guide or monitor treatment for MRSA infections. Test performance is not FDA approved in patients less than 81 years old. Performed at [facility], [place] [address] [place], [place] [address]   Blood Culture (routine x 2)  Status: None   Collection Time: 02/26/21 12:15 PM   Specimen: BLOOD RIGHT HAND  Result Value Ref Range Status   Specimen Description BLOOD RIGHT HAND  Final   Special Requests   Final    BOTTLES DRAWN AEROBIC AND ANAEROBIC Blood Culture adequate volume   Culture   Final    NO GROWTH 5 DAYS Performed at [facility], [address], [place], [place] [address]    Report Status 03/03/2021 FINAL  Final  Aerobic/Anaerobic Culture w Gram Stain (surgical/deep wound)     Status: None (Preliminary result)   Collection Time: 02/26/21  4:00 PM   Specimen: Pleural Fluid  Result Value Ref Range Status   Specimen Description FLUID PLEURAL RIGHT  Final   Special Requests NONE  Final   Gram Stain   Final    RARE WBC PRESENT,BOTH PMN AND MONONUCLEAR NO ORGANISMS SEEN    Culture   Final    NO GROWTH 4 DAYS NO ANAEROBES ISOLATED; CULTURE IN PROGRESS FOR 5 DAYS Performed at [facility], [place] [address] [place], [place] [address]    Report Status PENDING  Incomplete  Surgical pcr screen     Status: None   Collection Time: 02/28/21  7:59 AM   Specimen: Nasal Mucosa; Nasal Swab  Result Value Ref Range Status    MRSA, PCR NEGATIVE NEGATIVE Final   Staphylococcus aureus NEGATIVE NEGATIVE Final    Comment: (NOTE) The Xpert SA Assay (FDA approved for NASAL specimens in patients 5 years of age and older), is one component of a comprehensive surveillance program. It is not intended to diagnose infection nor to guide or monitor treatment. Performed at [facility], [place] [address] [place], [place] [address]          Radiology Studies: DG Chest Center 1 View  Result Date: 03/03/2021 CLINICAL DATA:  60 year old male with history of HTN, stage III adenocarcinoma of lung, depression, recent admission 5/16-5/20 during which he underwent right upper lobe lobectomy and chest wall dissection(VATS) by Dr. Roxan Hockey, presented with severe cough, bright red hemoptysis, shortness of breath.Chest CT revealed diffuse infiltrates concerning for alveolar hemorrhage with possible superimposed infectious process/pulmonary edema. Patient was started on broad-spectrum antibiotics. EXAM: PORTABLE CHEST 1 VIEW COMPARISON:  03/02/2021 and older exams. FINDINGS: Postsurgical changes at the right lung apex are stable as is the small caliber right upper hemithorax chest tube. Interstitial lung opacities are similar to the prior study. Hazy airspace opacities noted previously have improved. No new lung abnormalities. No convincing pneumothorax or pleural effusion. IMPRESSION: 1. Improved airspace lung opacities compared to exams dating back to 02/26/2021. Mild persistent interstitial lung opacities. No new lung abnormalities. 2. Stable post surgical changes in the right upper hemithorax. No pneumothorax. Electronically Signed   By: Lajean Manes M.D.   On: 03/03/2021 08:15   DG Chest Port 1 View  Result Date: 03/02/2021 CLINICAL DATA:  VATS/right upper lobectomy.  Right chest tube. EXAM: PORTABLE CHEST 1 VIEW COMPARISON:  03/01/2021 FINDINGS: Stable position of the right apical chest tube. Residual pleural based densities at  the right lung apex with postsurgical changes. Stable volume loss in the right hemithorax. Hazy interstitial lung densities bilaterally that have minimally changed. Heart size is stable. IMPRESSION: 1. Stable positioning of the right apical small bore chest tube. Aeration at the right lung apex has markedly improved since chest tube placement but there are residual densities in this area. Negative for a large pneumothorax. 2. Diffuse interstitial  lung densities that could represent interstitial pulmonary edema. Findings are similar to the recent comparison examination. Electronically Signed   By: Markus Daft M.D.   On: 03/02/2021 11:04        Scheduled Meds:  amLODipine  10 mg Oral Daily   calcium carbonate  1 tablet Oral TID   gabapentin  400 mg Oral TID   ipratropium-albuterol  3 mL Nebulization TID   irbesartan  300 mg Oral Daily   ketorolac  30 mg Intravenous Q6H   latanoprost  1 drop Both Eyes QHS   methylPREDNISolone (SOLU-MEDROL) injection  40 mg Intravenous Q12H   QUEtiapine  50 mg Oral QHS   sertraline  100 mg Oral Daily   Continuous Infusions:  ceFEPime (MAXIPIME) IV 2 g (03/03/21 1148)   promethazine (PHENERGAN) injection (IM or IVPB)       LOS: 5 days    Time spent: 35 min     Nicolette Bang, MD [facility]  If 7PM-7AM, please contact night-coverage  03/03/2021, 1:33 PM

## 2021-03-03 NOTE — Progress Notes (Signed)
Procedure(s) (LRB): VIDEO BRONCHOSCOPY (N/A) Subjective:  Breathing easier after fentanyl. Eases his "tightness"    Objective: Vital signs in last 24 hours: Temp:  [97.9 F (36.6 C)-99.6 F (37.6 C)] 98.9 F (37.2 C) (06/25 1130) Pulse Rate:  [66-99] 88 (06/25 1318) Cardiac Rhythm: Normal sinus rhythm (06/25 0700) Resp:  [14-29] 14 (06/25 1318) BP: (115-143)/(69-81) 142/79 (06/25 1130) SpO2:  [90 %-99 %] 97 % (06/25 1318) FiO2 (%):  [55 %-70 %] 55 % (06/25 1318)  Hemodynamic parameters for last 24 hours:    Intake/Output from previous day: 06/24 0701 - 06/25 0700 In: 600.1 [P.O.:300; IV Piggyback:300.1] Out: 810 [Urine:700; Chest Tube:110] Intake/Output this shift: Total I/O In: 960 [P.O.:960] Out: 600 [Urine:600]  General appearance: alert and cooperative Heart: regular rate and rhythm, S1, S2 normal Lungs: clear to auscultation bilaterally  Lab Results: Recent Labs    03/02/21 1254 03/03/21 0024  WBC 17.1* 13.4*  HGB 12.0* 10.9*  HCT 35.2* 32.0*  PLT 366 361   BMET:  Recent Labs    03/02/21 1254 03/03/21 0024  NA 132* 137  K 3.0* 4.1  CL 97* 101  CO2 25 27  GLUCOSE 177* 191*  BUN 10 18  CREATININE 0.52* 0.61  CALCIUM 8.5* 9.2    PT/INR: No results for input(s): LABPROT, INR in the last 72 hours. ABG    Component Value Date/Time   PHART 7.458 (H) 02/26/2021 0630   HCO3 28.7 (H) 02/26/2021 0630   TCO2 30 02/26/2021 0630   ACIDBASEDEF 1.3 01/18/2021 1504   O2SAT 96.0 02/26/2021 0630   CBG (last 3)  No results for input(s): GLUCAP in the last 72 hours.  Narrative & Impression  CLINICAL DATA:  60 year old male with history of HTN, stage III adenocarcinoma of lung, depression, recent admission 5/16-5/20 during which he underwent right upper lobe lobectomy and chest wall dissection(VATS) by Dr. Roxan Hockey, presented with severe cough, bright red hemoptysis, shortness of breath.Chest CT revealed diffuse infiltrates concerning for alveolar  hemorrhage with possible superimposed infectious process/pulmonary edema. Patient was started on broad-spectrum antibiotics.   EXAM: PORTABLE CHEST 1 VIEW   COMPARISON:  03/02/2021 and older exams.   FINDINGS: Postsurgical changes at the right lung apex are stable as is the small caliber right upper hemithorax chest tube.   Interstitial lung opacities are similar to the prior study. Hazy airspace opacities noted previously have improved. No new lung abnormalities.   No convincing pneumothorax or pleural effusion.   IMPRESSION: 1. Improved airspace lung opacities compared to exams dating back to 02/26/2021. Mild persistent interstitial lung opacities. No new lung abnormalities. 2. Stable post surgical changes in the right upper hemithorax. No pneumothorax.     Electronically Signed   By: Lajean Manes M.D.   On: 03/03/2021 08:15     Assessment/Plan: S/P Procedure(s) (LRB): VIDEO BRONCHOSCOPY (N/A)  Minimal chest tube output so it can be removed. Repeat CXR in am.  Acute hypoxemic respiratory failure still requiring HFNC 25L. On antibiotics and steroids per pulmonary/CCM.   LOS: 5 days    Christopher Carter 03/03/2021

## 2021-03-04 ENCOUNTER — Inpatient Hospital Stay (HOSPITAL_COMMUNITY): Payer: 59

## 2021-03-04 DIAGNOSIS — J9601 Acute respiratory failure with hypoxia: Secondary | ICD-10-CM

## 2021-03-04 LAB — CBC WITH DIFFERENTIAL/PLATELET
Abs Immature Granulocytes: 0.14 10*3/uL — ABNORMAL HIGH (ref 0.00–0.07)
Basophils Absolute: 0 10*3/uL (ref 0.0–0.1)
Basophils Relative: 0 %
Eosinophils Absolute: 0 10*3/uL (ref 0.0–0.5)
Eosinophils Relative: 0 %
HCT: 30.4 % — ABNORMAL LOW (ref 39.0–52.0)
Hemoglobin: 10.2 g/dL — ABNORMAL LOW (ref 13.0–17.0)
Immature Granulocytes: 1 %
Lymphocytes Relative: 3 %
Lymphs Abs: 0.6 10*3/uL — ABNORMAL LOW (ref 0.7–4.0)
MCH: 30.4 pg (ref 26.0–34.0)
MCHC: 33.6 g/dL (ref 30.0–36.0)
MCV: 90.5 fL (ref 80.0–100.0)
Monocytes Absolute: 1.5 10*3/uL — ABNORMAL HIGH (ref 0.1–1.0)
Monocytes Relative: 8 %
Neutro Abs: 16.8 10*3/uL — ABNORMAL HIGH (ref 1.7–7.7)
Neutrophils Relative %: 88 %
Platelets: 426 10*3/uL — ABNORMAL HIGH (ref 150–400)
RBC: 3.36 MIL/uL — ABNORMAL LOW (ref 4.22–5.81)
RDW: 14.3 % (ref 11.5–15.5)
WBC: 19 10*3/uL — ABNORMAL HIGH (ref 4.0–10.5)
nRBC: 0 % (ref 0.0–0.2)

## 2021-03-04 LAB — BASIC METABOLIC PANEL
Anion gap: 7 (ref 5–15)
BUN: 22 mg/dL — ABNORMAL HIGH (ref 6–20)
CO2: 26 mmol/L (ref 22–32)
Calcium: 9 mg/dL (ref 8.9–10.3)
Chloride: 104 mmol/L (ref 98–111)
Creatinine, Ser: 0.61 mg/dL (ref 0.61–1.24)
GFR, Estimated: >60 mL/min (ref >60)
Glucose, Bld: 189 mg/dL — ABNORMAL HIGH (ref 70–99)
Potassium: 3.6 mmol/L (ref 3.5–5.1)
Sodium: 137 mmol/L (ref 135–145)

## 2021-03-04 LAB — AEROBIC/ANAEROBIC CULTURE W GRAM STAIN (SURGICAL/DEEP WOUND): Culture: NO GROWTH

## 2021-03-04 IMAGING — DX DG CHEST 1V PORT
2 series · 2 of 2 positions shown · non-contrast
Comparison: [DATE] and older exams.

CLINICAL DATA: Follow-up exam. Edema. Status post chest tube.
Status post right video-assisted lobectomy.

EXAM:
PORTABLE CHEST 1 VIEW

[chest ap (1 of 2)]
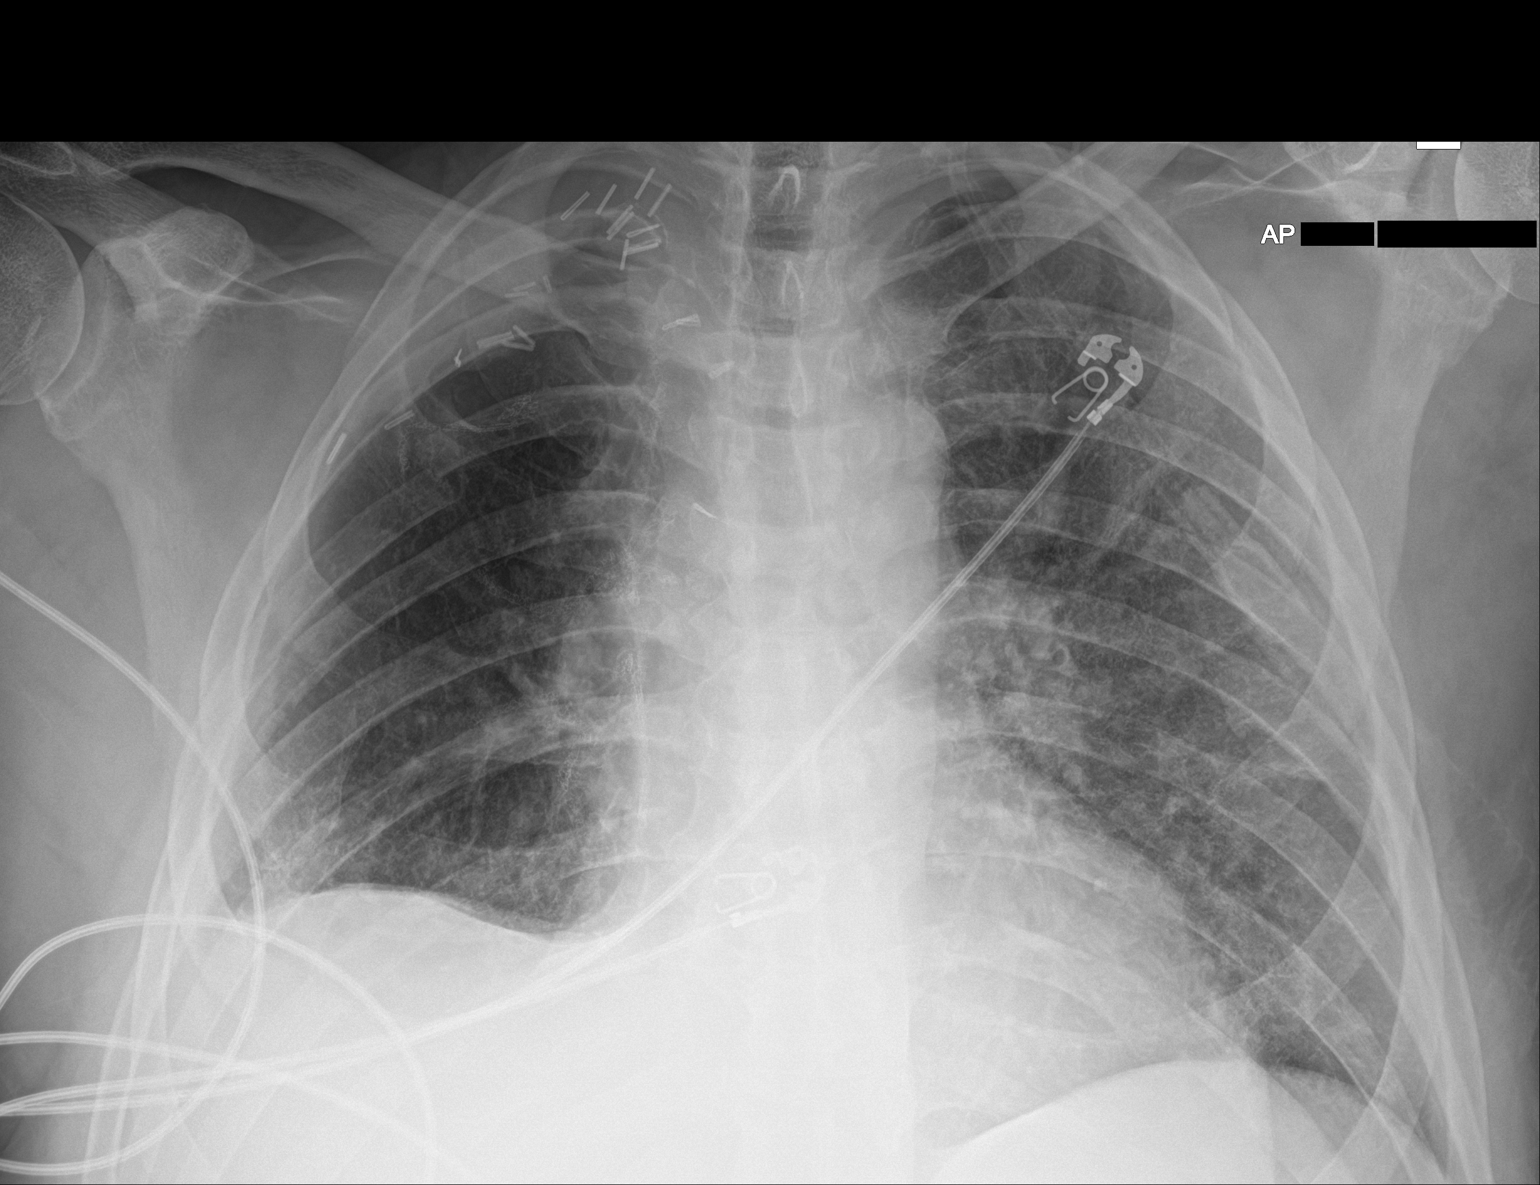

[chest ap (2 of 2)]
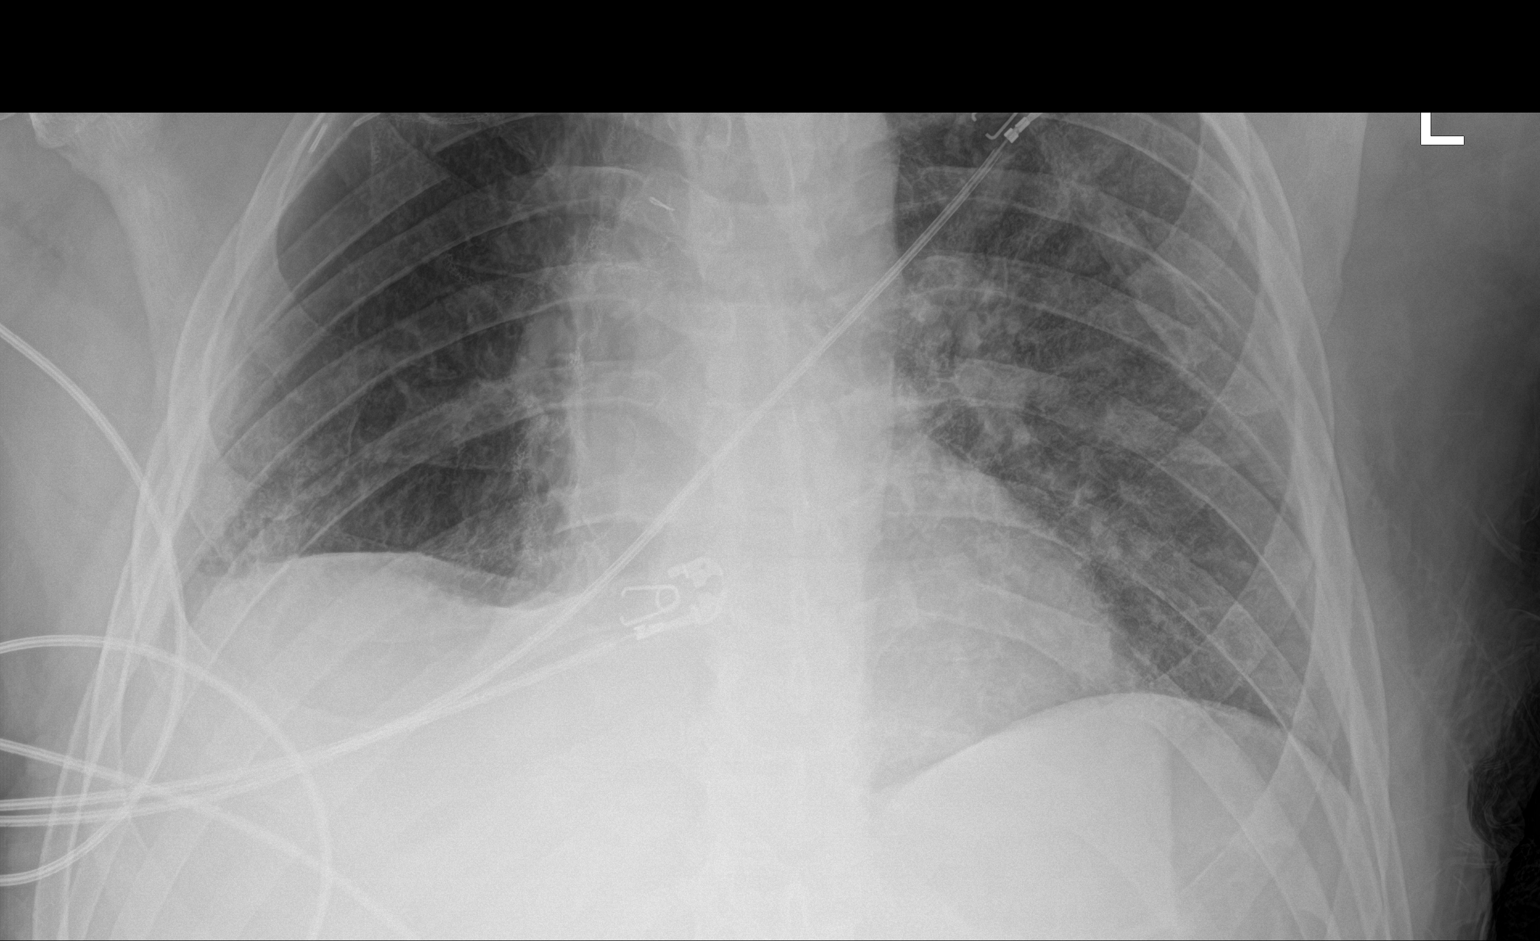

[2 of 2 positions shown; findings below may reference images not displayed]

FINDINGS: Since the earlier study, the small caliber right chest tube has been
removed. No pneumothorax.

Postsurgical changes in the right upper lung/hemithorax are stable.
Lungs demonstrate bilateral interstitial opacities similar to the
previous day's exam. No new lung abnormalities. No convincing
pleural effusion.
IMPRESSION: 1. Status post removal of the right-sided chest tube.
2. No pneumothorax.
3. No other change from the previous day's study.

## 2021-03-04 MED ORDER — POTASSIUM CHLORIDE CRYS ER 10 MEQ PO TBCR
20.0000 meq | EXTENDED_RELEASE_TABLET | Freq: Once | ORAL | Status: AC
  Administered 2021-03-04: 20 meq via ORAL
  Filled 2021-03-04: qty 2

## 2021-03-04 NOTE — Progress Notes (Addendum)
Procedure(s) (LRB): VIDEO BRONCHOSCOPY (N/A) Subjective:  Up in the bedside chair, says he is feeling stronger and rested better last night.  O2 down to 15L hi flow, sats 94%.     Objective: Vital signs in last 24 hours: Temp:  [98.1 F (36.7 C)-99.3 F (37.4 C)] 98.7 F (37.1 C) (06/26 1130) Pulse Rate:  [64-93] 85 (06/26 1130) Cardiac Rhythm: Normal sinus rhythm (06/26 0700) Resp:  [14-35] 18 (06/26 1130) BP: (124-152)/(74-84) 152/75 (06/26 1130) SpO2:  [90 %-97 %] 93 % (06/26 1144) FiO2 (%):  [40 %-55 %] 40 % (06/26 1144)  Hemodynamic parameters for last 24 hours:    Intake/Output from previous day: 06/25 0701 - 06/26 0700 In: 1453.8 [P.O.:960; IV Piggyback:493.8] Out: 800 [Urine:800] Intake/Output this shift: Total I/O In: 480 [P.O.:480] Out: -   General appearance: alert and cooperative Heart: regular rate and rhythm Lungs: clear to auscultation bilaterally  Lab Results: Recent Labs    03/03/21 0024 03/04/21 0052  WBC 13.4* 19.0*  HGB 10.9* 10.2*  HCT 32.0* 30.4*  PLT 361 426*    BMET:  Recent Labs    03/03/21 0024 03/04/21 0052  NA 137 137  K 4.1 3.6  CL 101 104  CO2 27 26  GLUCOSE 191* 189*  BUN 18 22*  CREATININE 0.61 0.61  CALCIUM 9.2 9.0     PT/INR: No results for input(s): LABPROT, INR in the last 72 hours. ABG    Component Value Date/Time   PHART 7.458 (H) 02/26/2021 0630   HCO3 28.7 (H) 02/26/2021 0630   TCO2 30 02/26/2021 0630   ACIDBASEDEF 1.3 01/18/2021 1504   O2SAT 96.0 02/26/2021 0630   CBG (last 3)  No results for input(s): GLUCAP in the last 72 hours.  EXAM: PORTABLE CHEST 1 VIEW   COMPARISON:  03/03/2021 and older exams.   FINDINGS: Since the earlier study, the small caliber right chest tube has been removed. No pneumothorax.   Postsurgical changes in the right upper lung/hemithorax are stable. Lungs demonstrate bilateral interstitial opacities similar to the previous day's exam. No new lung abnormalities.  No convincing pleural effusion.   IMPRESSION: 1. Status post removal of the right-sided chest tube. 2. No pneumothorax. 3. No other change from the previous day's study.     Electronically Signed   By: Lajean Manes M.D.   On: 03/04/2021 09:22    Assessment/Plan: S/P Procedure(s) (LRB): VIDEO BRONCHOSCOPY (N/A)  Stable CXR post chest tube removal. He is feeling stronger and is less hypoxic but still has significant leukocytosis.  ABX per primary team.    LOS: 6 days    Antony Odea, PA-C [phone] 03/04/2021   Chart reviewed, patient examined, agree with above. His leukocytosis is likely related to Solumedrol. CXR looks good.

## 2021-03-04 NOTE — Progress Notes (Signed)
NAME:  Christopher Carter, MRN:  [id number], DOB:  [birth year], LOS: 6 ADMISSION DATE:  02/25/2021, CONSULTATION DATE:  6/20 REFERRING MD:  Dwyane Dee, CHIEF COMPLAINT:  respiratory failure and hemoptysis    History of Present Illness:  This is a 60 year old non-smoker.  Recently status post right upper lobe lobectomy with node dissection 5/16 for T4, N0, stage IIIa adenocarcinoma.  Surgical notes stated there was still residual tumor posterior near spine which was unable to be resected.  He is yet to undergo chemo or radiation.  Had been recovering slowly with expected postoperative pain.  Started to note decreased energy, more fatigue, overall malaise sometime around 6/15.  Had been seen the day prior by cardiothoracic's who noted loculated right upper lobe collection from surgical site for which plan CT-guided aspiration was planned for 6/21.  On 6/19 he continued to feel weak and fatigued, around midday he began to cough up blood.  Noting about a tablespoon at a time, reporting it is being bright red, and at its worst up to 6 times an hour.  At sometimes the cough would be so intense that it would make him nauseated and he would also vomit because of this, and worsening shortness of breath he reported to the emergency room around 8 PM on 6/19 .  In the ER he was found to be hypoxic requiring titration of oxygen up to 100% on heated high flow.  Initial CT imaging on 6/20 was negative for pulmonary embolus, there is a loculated moderate size right apical pleural fluid collection, diffuse bilateral airspace disease, and subacute right fifth and nondisplaced sixth rib fractures secondary to prior "thoracotomy.  He was admitted by the internal medicine service, cardiothoracic surgery is already consulted, because of the supplemental oxygen need pulmonary asked to evaluate  Pertinent  Medical History  Hypertension, depression.  Right upper lobe lung mass status post thoracotomy and right upper lobectomy with node  dissection 5/16.  Mass noted to be invading the chest wall with positive margins posteriorly near the spine.  He is T4, N0, stage IIIa adenocarcinoma.  Surgical notes document residual tumor posteriorly near the spine which was unable to be resected.  He has yet to undergo adjuvant radiation or chemo Last seen in cardiothoracic clinic on 6/17 at which time imaging noted to see a new loculated right upper lobe fluid collection and there was plan to proceed with CT-guided aspiration Significant Hospital Events: Including procedures, antibiotic start and stop dates in addition to other pertinent events   6/19 admitted with new hemoptysis,, fatigue, and worsening shortness of breath.  Found to be hypoxic on arrival requiring titration of supplemental oxygen up to heated high flow.  Started on azithromycin, ceftriaxone, and vancomycin  6/20   CT imaging obtained showing previously identified loculated right apical pleural effusion, negative for pulmonary emboli.  There is diffuse alveolar airspace disease.  Pulmonary asked to evaluate.  Interventional radiology consulted 6/20 IR placed anterior chestube into loculated effusion 6/24 started steroids 6/25 chest tube DC'd  Interim History / Subjective:   Afebrile No chest pain, dyspnea improving Complains of sweating Down to 40% on high flow nasal cannula   Objective   Blood pressure (!) 143/74, pulse 84, temperature 98.5 F (36.9 C), temperature source Oral, resp. rate 19, height $RemoveBe'5\' 10"'ezKpckmie$  (1.778 m), weight 83.9 kg, SpO2 94 %.    FiO2 (%):  [40 %-60 %] 40 %   Intake/Output Summary (Last 24 hours) at 03/04/2021 0959 Last data filed at 03/04/2021  0800 Gross per 24 hour  Intake 1453.78 ml  Output 200 ml  Net 1253.78 ml    Filed Weights   02/26/21 0208  Weight: 83.9 kg    Examination: General: acutely ill appearing male on HHF oxygen  HENT: MM  pink and moist, No LAD Lungs: No crackles or rhonchi, no accessory muscle use Cardiovascular:  S1-S2 regular, no murmur Abdomen: Soft not tender , ND, BS +,no organomegaly Extremities: Warm dry with brisk capillary refill, no edema Neuro: Awake oriented no focal deficits, anxious  Labs/imaging that I havepersonally reviewed  (right click and "Reselect all SmartList Selections" daily)   Chest x-ray 6/26 independently reviewed-haziness over right upper lobe, decreased volume on right, improved bilateral interstitial infiltrates  Labs show increasing leukocytosis, stable anemia, mild hypokalemia  Resolved Hospital Problem list     Assessment & Plan:  Acute hypoxic respiratory failure (POA) in the setting of diffuse pulmonary infiltrates with associated hemoptysis -Concern for alveolar hemorrhage versus aspiration induced damage  -s/p chest tube placement, RUL anterior bloody fluid collection - Remains on 20-25 L High flow oxygen, but gradually improving hypoxia Plan -Plan for 7 days of cefepime, leukocytosis now seems to be related to steroids -Continue to dial down FiO2 , can attempt to transition to regular high flow -Empiric Solu-Medrol 40 every 12 >> dramatic improvement since starting so we will commit him to at least 2 weeks of steroids, start dialing down once hypoxia improves   right upper lobe loculated pleural effusion -- Chest tube discontinued 6/25   Stage IIIa adenocarcinoma of the lung status post right upper lobe lobectomy, yet to undergo adjunctive therapy Plan Follow-up as outpatient , note pleural involvement upgrades him to stage IV Molecular markers show met 1 and PD-L1 expression  PT eval for deconditioning    Labs   CBC: Recent Labs  Lab 02/25/21 2204 02/26/21 0630 02/27/21 0026 02/28/21 0021 03/01/21 0023 03/02/21 1254 03/03/21 0024 03/04/21 0052  WBC 29.4*  --  20.7* 17.9* 16.7* 17.1* 13.4* 19.0*  NEUTROABS 26.0*  --  17.2*  --  13.0* 13.7*  --  16.8*  HGB 13.7   < > 11.6* 11.2* 11.7* 12.0* 10.9* 10.2*  HCT 40.2   < > 34.1* 34.4*  34.8* 35.2* 32.0* 30.4*  MCV 90.1  --  91.9 92.2 91.3 89.8 89.4 90.5  PLT 473*  --  378 345 332 366 361 426*   < > = values in this interval not displayed.     Basic Metabolic Panel: Recent Labs  Lab 02/27/21 0026 02/28/21 0021 03/01/21 0023 03/02/21 1254 03/03/21 0024 03/04/21 0052  NA 137 134* 134* 132* 137 137  K 3.8 3.4* 3.5 3.0* 4.1 3.6  CL 102 100 103 97* 101 104  CO2 $Re'27 23 23 25 27 26  'kpJ$ GLUCOSE 121* 116* 98 177* 191* 189*  BUN $Re'6 8 10 10 18 'Fjl$ 22*  CREATININE 0.52* 0.56* 0.53* 0.52* 0.61 0.61  CALCIUM 9.0 8.6* 8.6* 8.5* 9.2 9.0  MG 1.9 1.9  --  2.0 2.3  --   PHOS  --  2.8  --   --   --   --     GFR: Estimated Creatinine Clearance: 103.9 mL/min (by C-G formula based on SCr of 0.61 mg/dL). Recent Labs  Lab 02/26/21 0241 02/26/21 0403 02/26/21 1215 02/27/21 0026 02/28/21 0021 03/01/21 0023 03/02/21 1254 03/03/21 0024 03/04/21 0052  PROCALCITON  --   --   --  <0.10  --   --   --   --   --  WBC  --   --   --  20.7*   < > 16.7* 17.1* 13.4* 19.0*  LATICACIDVEN 1.6 3.7* 1.1  --   --   --   --   --   --    < > = values in this interval not displayed.     Liver Function Tests: Recent Labs  Lab 02/25/21 2204 02/27/21 0026  AST 21 21  ALT 28 19  ALKPHOS 103 75  BILITOT 1.1 1.0  PROT 7.1 5.9*  ALBUMIN 3.6 3.0*    Recent Labs  Lab 02/25/21 2204  LIPASE 18    No results for input(s): AMMONIA in the last 168 hours.  ABG    Component Value Date/Time   PHART 7.458 (H) 02/26/2021 0630   PCO2ART 40.5 02/26/2021 0630   PO2ART 79 (L) 02/26/2021 0630   HCO3 28.7 (H) 02/26/2021 0630   TCO2 30 02/26/2021 0630   ACIDBASEDEF 1.3 01/18/2021 1504   O2SAT 96.0 02/26/2021 0630      Coagulation Profile: Recent Labs  Lab 02/25/21 2204 02/27/21 0026  INR 1.1 1.1     Cardiac Enzymes: No results for input(s): CKTOTAL, CKMB, CKMBINDEX, TROPONINI in the last 168 hours.  HbA1C: No results found for: HGBA1C  CBG: Recent Labs  Lab 02/26/21 1101  02/26/21 1700 02/26/21 2041  [place] MD. FCCP. [facility] Pager : [phone]  If no response to pager , please call [phone] until 7 pm After 7:00 pm call Elink  [phone]   03/04/2021

## 2021-03-04 NOTE — Progress Notes (Signed)
PROGRESS NOTE    Christopher Carter  DGL:[id number] DOB: [birth year] DOA: 02/25/2021 PCP: [name], MD   Brief Narrative:  Patient is a 60 year old male with history of HTN, stage III adenocarcinoma of lung, depression, recent admission 5/16-5/20 during which he underwent right upper lobe lobectomy and chest wall dissection(VATS) by Dr. Roxan Hockey, presented with severe cough, bright red hemoptysis, shortness of breath.   Chest CT revealed diffuse infiltrates concerning for alveolar hemorrhage with possible superimposed infectious process/pulmonary edema.   Patient was started on broad-spectrum antibiotics. PCCM/IR consulted and he underwent right-sided chest tube placement for hemothorax.   Cardiothoracic surgery also consulting.   Patient currently being managed for acute hypoxic respiratory failure requiring high flow oxygen, possible superimposed pneumonia   Assessment & Plan:   Principal Problem:   Acute respiratory failure with hypoxia (HCC) Active Problems:   Essential hypertension   Pulmonary alveolar hemorrhage   Adenocarcinoma of right lung (HCC)   Pneumonia of right lung due to infectious organism   Sepsis due to pneumonia ([place])   Major depressive disorder   Acute hypoxic respiratory failure present on admission. Right upper lobe loculated pleural effusion Hemothorax concern for alveolar hemorrhage Recent right upper lobectomy for stage III adenocarcinoma. Severe sepsis secondary to healthcare associated pneumonia Presented with hemoptysis, cough, shortness of breath requiring supplemental oxygen.  Met SIRS criteria on admission with tachycardia, tachypnea, leukocytosis.  Endorgan damage with lactic acidosis. His hypoxia worsened during the hospitalization and currently he is on high flow oxygen 25L 60%> 25L 40% today, CONTINUING TO TRY AND WEAN FOR POSSIBLE BRONCHOSCOPY Acute hypoxic respite failure is multifactorial secondary to progressive alveolar  hemorrhage, possible superimposed pneumonia.  Pigtail chest tube placement by IR on 5/21.> d/c 6/25 Cardiothoracic surgery consulted. Pulmonary consulted as well.  Patient may require bronchoscopy Started on broad-spectrum IV biotics, currently on IV cefepime-PLAN FOR 7 days Cultures so far negative without any growth. Leukocytosis improving. Chest x-ray on 6/24 shows increasing congestion, started on IV Lasix x1 with significant improvement respiratory status. Also IV steroids were added 6-24 to aid in healing. Monitoring PCCM and cardiothoracic surgery recommendation.    Pain control. Anxiety Remains to be one of the main issue, but on my eval this AM appears to be improving No improvement with IV fentanyl. Oral Dilaudid was added but continues to remain with severe pain. Currently Toradol, gabapentin dose increased, Dilaudid dose increased, continue fentanyl. May benefit from being on benzodiazepine.   Lactic acidosis:  Resolved with IV fluids   Accelerated hypertension Currently blood pressure elevated secondary to pain and anxiety On Avapro, amlodipine   History of depression:  Continue home medications   Adenocarcinoma of the right lung:  Diagnosed in April 2022, follows with Dr. Julien Nordmann.  Status post recent VATS with right upper lobe lobectomy and chest wall dissection by cardiothoracic surgery, Dr. Koleen Nimrod. Needs outpatient follow-up.   Hypokalemia:  Supplemented with potassium.  Insomnia. Ambien.   Body mass index is 26.54 kg/m.   DVT prophylaxis: SCD/Compression stockings  Code Status: full    Code Status Orders  (From admission, onward)           Start     Ordered   02/26/21 0612  Full code  Continuous        02/26/21 0613           Code Status History     Date Active Date Inactive Code Status Order ID Comments User Context   01/22/2021 1529 01/26/2021 1518 Full Code  [id number]  Elgie Collard, PA-C Inpatient      Family Communication:  wife at bedside  Disposition Plan:   TBD, no clinically stable for d/c Consults called:  cards pccm Admission status: Inpatient Status is: Inpatient   Remains inpatient appropriate because:Inpatient level of care appropriate due to severity of illness   Dispo: The patient is from: Home              Anticipated d/c is to: Home              Patient currently is not medically stable to d/c.              Difficult to place patient No  Consultants:  As above  Procedures:  DG Chest 2 View  Result Date: 02/25/2021 CLINICAL DATA:  History of right partial lobectomy with hemoptysis and increasing shortness of breath EXAM: CHEST - 2 VIEW COMPARISON:  02/20/2021 FINDINGS: Cardiac shadow is stable. Left lung remains clear. Right lung demonstrates right-sided pleural effusion and masslike density superiorly with postsurgical changes. This represents pleural fluid loculated from prior resection and stable in appearance. Some increasing parenchymal density is noted throughout the aerated lung which may represent hemorrhage given the history of hemoptysis. No new mass lesion is seen. IMPRESSION: Stable masslike density in the right apex consisting of loculated fluid following recent surgery. Increasing interstitial opacity within the right lung which may represent some hemorrhage given the hemoptysis. Small basilar right sided effusion. Electronically Signed   By: Inez Catalina M.D.   On: 02/25/2021 22:27   DG Chest 2 View  Result Date: 02/20/2021 CLINICAL DATA:  Right upper lobe mass EXAM: CHEST - 2 VIEW COMPARISON:  02/13/2021 FINDINGS: Cardiac shadow is stable. Volume loss on the right is noted with small effusions stable from the prior exam. Postoperative changes are noted in the right apex with persistent soft tissue mass lesion stable from the prior exam. Left lung remains clear. Posterior fracture of the right fifth rib is noted stable from previous exam. IMPRESSION: Stable right upper lobe mass  lesion.  Postsurgical changes are seen. Electronically Signed   By: Inez Catalina M.D.   On: 02/20/2021 14:32   DG Chest 2 View  Result Date: 02/13/2021 CLINICAL DATA:  Status post thoracotomy and VATS procedure EXAM: CHEST - 2 VIEW COMPARISON:  Jan 26, 2021 FINDINGS: There is a masslike area in the right upper lobe extending to the apex with surgical clips in this area. This masslike area measures 8.0 x 8.1 cm. There is a right pleural effusion with volume loss on the right. Left lung is clear. Heart size and pulmonary vascularity normal. No adenopathy no bone lesions. IMPRESSION: 8.1 x 8.0 cm masslike area right upper lobe toward the apex. Suspect postoperative loculated fluid hematoma in this area is most likely etiology given the dramatic change compared to the study from approximately 2 weeks prior. There is a small right pleural effusion there is right base volume loss. Left lung is clear. Heart size normal. Electronically Signed   By: Lowella Grip III M.D.   On: 02/13/2021 14:31   CT CHEST WO CONTRAST  Result Date: 02/16/2021 CLINICAL DATA:  History of right upper lobe lung cancer status post right upper lobe lobectomy. Follow-up postoperative right apical fluid collection. EXAM: CT CHEST WITHOUT CONTRAST TECHNIQUE: Multidetector CT imaging of the chest was performed following the standard protocol without IV contrast. COMPARISON:  PET-CT 12/13/2020 and chest x-ray 02/13/2021 FINDINGS: Cardiovascular: The heart is normal  in size peer no pericardial effusion. Stable age advanced coronary artery calcifications. No definite aortic calcifications. Mediastinum/Nodes: Stable scattered sub 8 mm mediastinal lymph nodes. No new or progressive findings. The esophagus is grossly normal. Lungs/Pleura: Surgical changes from a right upper lobe lobectomy. There is a moderate-sized right pleural effusion with loculated fluid at the right lung apex. Small amount of residual pleural air is also noted. No worrisome  pulmonary nodules to suggest pulmonary metastatic disease. Upper Abdomen: No significant upper abdominal findings. No hepatic or adrenal gland lesions are identified. Musculoskeletal: No significant bony findings. IMPRESSION: 1. Surgical changes from a right upper lobe lobectomy. 2. Moderate-sized right pleural effusion with loculated fluid at the right lung apex. 3. No worrisome pulmonary nodules to suggest pulmonary metastatic disease. 4. Stable age advanced coronary artery calcifications. Aortic Atherosclerosis (ICD10-I70.0) Electronically Signed   By: Marijo Sanes M.D.   On: 02/16/2021 16:27   CT Angio Chest PE W and/or Wo Contrast  Result Date: 02/26/2021 CLINICAL DATA:  hx of R partial lobectomy, has been coughing up blood since about noon, increased shortness of breath, denies home 02, now on 3L [place]. Pulmonary embolus suspected. VATS and thoracotomy surgery 01/22/2021 EXAM: CT ANGIOGRAPHY CHEST WITH CONTRAST TECHNIQUE: Multidetector CT imaging of the chest was performed using the standard protocol during bolus administration of intravenous contrast. Multiplanar CT image reconstructions and MIPs were obtained to evaluate the vascular anatomy. CONTRAST:  27m OMNIPAQUE IOHEXOL 350 MG/ML SOLN COMPARISON:  CT chest 02/16/2021 FINDINGS: Cardiovascular: Satisfactory opacification of the pulmonary arteries to the segmental level. No evidence of pulmonary embolism. Normal heart size. No pericardial effusion. The thoracic aorta is normal in caliber. Coronary artery calcifications are noted. Mediastinum/Nodes: No enlarged mediastinal, hilar, or axillary lymph nodes. Thyroid gland, trachea, and esophagus demonstrate no significant findings. Lungs/Pleura: Surgical changes related to a partial right lobectomy of the right upper lobe with associated loculated right apical moderate-sized pleural effusion. Persistent trace basilar pleural effusion. Diffuse, right greater than left as well as lower lobe greater than upper  lobe, peribronchovascular ground-glass and consolidative opacities. Some peripheral airspace opacities are also noted. Upper Abdomen: No acute abnormality. Musculoskeletal: No chest wall abnormality. No suspicious lytic or blastic osseous lesions. No acute displaced fracture. Redemonstration of subacute right comminuted fifth as well as right nondisplaced sixth rib fractures that are broken in 2 separate places. Multilevel degenerative changes of the spine. Review of the MIP images confirms the above findings. IMPRESSION: 1. No pulmonary embolus. 2. Diffuse pulmonary findings findings suggestive of alveolar hemorrhage versus infection/inflammation versus pulmonary edema. 3. Similar-appearing loculated moderate sized right apical pleural effusion with limited evaluation due to timing of intravenous contrast. Persistent trace right basilar pleural effusion. 4. Subacute right comminuted fifth as well as right nondisplaced sixth rib fractures that are broken in 2 separate places consistent with history of thoracotomy. 5. Coronary artery calcifications. Electronically Signed   By: MIven FinnM.D.   On: 02/26/2021 04:59   DG CHEST PORT 1 VIEW  Result Date: 03/04/2021 CLINICAL DATA:  Follow-up exam. Edema. Status post chest tube. Status post right video-assisted lobectomy. EXAM: PORTABLE CHEST 1 VIEW COMPARISON:  03/03/2021 and older exams. FINDINGS: Since the earlier study, the small caliber right chest tube has been removed. No pneumothorax. Postsurgical changes in the right upper lung/hemithorax are stable. Lungs demonstrate bilateral interstitial opacities similar to the previous day's exam. No new lung abnormalities. No convincing pleural effusion. IMPRESSION: 1. Status post removal of the right-sided chest tube. 2. No pneumothorax. 3.  No other change from the previous day's study. Electronically Signed   By: Lajean Manes M.D.   On: 03/04/2021 09:22   DG Chest Port 1 View  Result Date:  03/03/2021 CLINICAL DATA:  60 year old male with history of HTN, stage III adenocarcinoma of lung, depression, recent admission 5/16-5/20 during which he underwent right upper lobe lobectomy and chest wall dissection(VATS) by Dr. Roxan Hockey, presented with severe cough, bright red hemoptysis, shortness of breath.Chest CT revealed diffuse infiltrates concerning for alveolar hemorrhage with possible superimposed infectious process/pulmonary edema. Patient was started on broad-spectrum antibiotics. EXAM: PORTABLE CHEST 1 VIEW COMPARISON:  03/02/2021 and older exams. FINDINGS: Postsurgical changes at the right lung apex are stable as is the small caliber right upper hemithorax chest tube. Interstitial lung opacities are similar to the prior study. Hazy airspace opacities noted previously have improved. No new lung abnormalities. No convincing pneumothorax or pleural effusion. IMPRESSION: 1. Improved airspace lung opacities compared to exams dating back to 02/26/2021. Mild persistent interstitial lung opacities. No new lung abnormalities. 2. Stable post surgical changes in the right upper hemithorax. No pneumothorax. Electronically Signed   By: Lajean Manes M.D.   On: 03/03/2021 08:15   DG Chest Port 1 View  Result Date: 03/02/2021 CLINICAL DATA:  VATS/right upper lobectomy.  Right chest tube. EXAM: PORTABLE CHEST 1 VIEW COMPARISON:  03/01/2021 FINDINGS: Stable position of the right apical chest tube. Residual pleural based densities at the right lung apex with postsurgical changes. Stable volume loss in the right hemithorax. Hazy interstitial lung densities bilaterally that have minimally changed. Heart size is stable. IMPRESSION: 1. Stable positioning of the right apical small bore chest tube. Aeration at the right lung apex has markedly improved since chest tube placement but there are residual densities in this area. Negative for a large pneumothorax. 2. Diffuse interstitial lung densities that could represent  interstitial pulmonary edema. Findings are similar to the recent comparison examination. Electronically Signed   By: Markus Daft M.D.   On: 03/02/2021 11:04   DG Chest Port 1 View  Result Date: 03/01/2021 CLINICAL DATA:  Status post right lung lobectomy. EXAM: PORTABLE CHEST 1 VIEW COMPARISON:  02/28/2021.  02/27/2021. FINDINGS: Right chest tube in stable position. Stable postsurgical changes right apex. Tiny residual right apical pneumothorax cannot be completely excluded. Bilateral interstitial infiltrates/edema, slightly progressed on the left from prior exam. No prominent pleural effusion. Heart size stable. IMPRESSION: 1. Right apical chest tube in stable position. Stable postsurgical changes right apex. Tiny residual right apical pneumothorax cannot be completely excluded. 2. Bilateral interstitial infiltrates/edema slightly progressed on the left from prior exam. Electronically Signed   By: Barrett   On: 03/01/2021 08:36   DG Chest Port 1 View  Result Date: 02/28/2021 CLINICAL DATA:  Chest tube.  Shortness of breath. EXAM: PORTABLE CHEST 1 VIEW COMPARISON:  02/27/2021. FINDINGS: Surgical clips right chest. Right chest tube in stable position over the right apex. Stable right apical pleural thickening and tiny pneumothorax noted. Stable right lateral pleural thickening. Bilateral pulmonary infiltrates/edema, improved from prior exam. No acute bony abnormality. IMPRESSION: 1. Surgical clips right chest. Right chest tube in stable position over the right apex. Stable right apical pleural thickening and tiny pneumothorax noted. Stable right lateral pleural thickening. 2.  Bilateral pulmonary infiltrates/edema, improved from prior exam. Electronically Signed   By: Van Zandt   On: 02/28/2021 09:50   DG Chest Port 1 View  Result Date: 02/27/2021 CLINICAL DATA:  New hemoptysis, fatigue, worsening shortness of  breath, hypoxia EXAM: PORTABLE CHEST 1 VIEW COMPARISON:  Portable exam 0835 hours  compared to 02/25/2021 FINDINGS: Pigtail thoracostomy tube at RIGHT apex with decrease in loculated pleural effusion. Small loculated pneumothorax at RIGHT apex. Stable heart size and mediastinal contours. BILATERAL pulmonary infiltrates in the mid to lower lungs, increased on LEFT since previous exam. No acute osseous findings. IMPRESSION: Small residual loculated hydropneumothorax at RIGHT apex post chest tube insertion. BILATERAL mid to lower lung pulmonary infiltrates, slightly increased. Electronically Signed   By: Lavonia Dana M.D.   On: 02/27/2021 11:20   CT IMAGE GUIDED DRAINAGE BY PERCUTANEOUS CATHETER  Result Date: 02/26/2021 INDICATION: 60 year old male with a history recent surgery, presentation with hemoptysis, and loculated fluid of the surgery site. He has been referred for CT-guided chest tube placement EXAM: CT-GUIDED CHEST TUBE MEDICATIONS: The patient is currently admitted to the hospital and receiving intravenous antibiotics. The antibiotics were administered within an appropriate time frame prior to the initiation of the procedure. ANESTHESIA/SEDATION: 1.0 mg IV Versed 25 mcg IV Fentanyl Moderate Sedation Time:  16 minutes The patient was continuously monitored during the procedure by the interventional radiology nurse under my direct supervision. COMPLICATIONS: None TECHNIQUE: Informed written consent was obtained from the patient after a thorough discussion of the procedural risks, benefits and alternatives. All questions were addressed. Maximal Sterile Barrier Technique was utilized including caps, mask, sterile gowns, sterile gloves, sterile drape, hand hygiene and skin antiseptic. A timeout was performed prior to the initiation of the procedure. PROCEDURE: Patient was positioned in the supine position on the CT gantry table and a scout CT of the chest was performed for planning purposes. Once angle of approach was determined, the skin and subcutaneous tissues this scan was prepped and  draped in the usual sterile fashion, and a sterile drape was applied covering the operative field. A sterile gown and sterile gloves were used for the procedure. Local anesthesia was provided with 1% Lidocaine. The skin and subcutaneous tissues were infiltrated 1% lidocaine for local anesthesia, and a small stab incision was made with an 11 blade scalpel. Using CT guidance, a 18 gauge trocar needle was advanced into the pleural space of the right apex. After confirmation of the tip, modified Seldinger technique was used to place a 10 French pigtail drainage catheter. Approximately 60 cc of reddish thin fluid aspirated. Culture was sent to the lab for analysis. Catheter was then attached to water seal chamber and suction was applied confirming a operational chest tube. Retention suture was placed.  Sterile dressing was placed. Patient tolerated the procedure well and remained hemodynamically stable throughout. No complications were encountered and no significant blood loss was encounter FINDINGS: The scout CT demonstrates essentially a similar distribution and volume of confluent ground-glass opacity of the right lower lobe, right middle lobe, and left lower lobe. No significant fluid identified within the airways. Similar appearance of the loculated fluid collection within the surgical site at the right apex. After placement of the chest tube approximately 60 cc of thin reddish fluid aspirated which appears to be serosanguineous. No frank abscess. Culture was sent. IMPRESSION: Status post CT-guided right apical chest tube into loculated fluid revealing serosanguineous fluid. Culture was sent. Signed, Dulcy Fanny. Dellia Nims, RPVI [facility] Radiology Electronically Signed   By: Corrie Mckusick D.O.   On: 02/26/2021 17:09    Antimicrobials:  Cefepime x 7 days    Subjective: Pr resting in bedside chair  Objective: Vitals:   03/04/21 0124  03/04/21 0300 03/04/21  0717 03/04/21 0724  BP:  124/74 (!) 143/74   Pulse: 64 71 84   Resp: 17 (!) 25 19   Temp:  98.1 F (36.7 C) 98.5 F (36.9 C)   TempSrc:  Oral Oral   SpO2: 95% 90% 94% 94%  Weight:      Height:        Intake/Output Summary (Last 24 hours) at 03/04/2021 1117 Last data filed at 03/04/2021 0800 Gross per 24 hour  Intake 1453.78 ml  Output 200 ml  Net 1253.78 ml   Filed Weights   02/26/21 0208  Weight: 83.9 kg    Examination:  General exam: Appears calm and comfortable Respiratory system: Mild rales bilaterally, chest tube in place Cardiovascular system: S1 & S2 heard, RRR. No JVD, murmurs, rubs, gallops or clicks. No pedal edema. Gastrointestinal system: Abdomen is nondistended, soft and nontender. No organomegaly or masses felt. Normal bowel sounds heard. Central nervous system: Alert and oriented. No focal neurological deficits. Extremities: Warm well perfused, no edema Skin: No rashes, lesions or ulcers Psychiatry: Judgement and insight appear normal. Mood & affect appropriate.  Although appears mildly anxious .     Data Reviewed: I have personally reviewed following labs and imaging studies  CBC: Recent Labs  Lab 02/25/21 2204 02/26/21 0630 02/27/21 0026 02/28/21 0021 03/01/21 0023 03/02/21 1254 03/03/21 0024 03/04/21 0052  WBC 29.4*  --  20.7* 17.9* 16.7* 17.1* 13.4* 19.0*  NEUTROABS 26.0*  --  17.2*  --  13.0* 13.7*  --  16.8*  HGB 13.7   < > 11.6* 11.2* 11.7* 12.0* 10.9* 10.2*  HCT 40.2   < > 34.1* 34.4* 34.8* 35.2* 32.0* 30.4*  MCV 90.1  --  91.9 92.2 91.3 89.8 89.4 90.5  PLT 473*  --  378 345 332 366 361 426*   < > = values in this interval not displayed.   Basic Metabolic Panel: Recent Labs  Lab 02/27/21 0026 02/28/21 0021 03/01/21 0023 03/02/21 1254 03/03/21 0024 03/04/21 0052  NA 137 134* 134* 132* 137 137  K 3.8 3.4* 3.5 3.0* 4.1 3.6  CL 102 100 103 97* 101 104  CO2 _0 GLUCOSE 121* 116* 98 177* 191* 189*  BUN _1 22*  CREATININE 0.52* 0.56* 0.53* 0.52* 0.61 0.61  CALCIUM 9.0 8.6* 8.6* 8.5* 9.2 9.0  MG 1.9 1.9  --  2.0 2.3  --   PHOS  --  2.8  --   --   --   --    GFR: Estimated Creatinine Clearance: 103.9 mL/min (by C-G formula based on SCr of 0.61 mg/dL). Liver Function Tests: Recent Labs  Lab 02/25/21 2204 02/27/21 0026  AST 21 21  ALT 28 19  ALKPHOS 103 75  BILITOT 1.1 1.0  PROT 7.1 5.9*  ALBUMIN 3.6 3.0*   Recent Labs  Lab 02/25/21 2204  LIPASE 18   No results for input(s): AMMONIA in the last 168 hours. Coagulation Profile: Recent Labs  Lab 02/25/21 2204 02/27/21 0026  INR 1.1 1.1   Cardiac Enzymes: No results for input(s): CKTOTAL, CKMB, CKMBINDEX, TROPONINI in the last 168 hours. BNP (last 3 results) No results for input(s): PROBNP in the last 8760 hours. HbA1C: No results for input(s): HGBA1C in the last 72 hours. CBG: Recent Labs  Lab 02/26/21 1101 02/26/21 1700 02/26/21 2041  GLUCAP 135* 147* 135*   Lipid Profile: No results for input(s): CHOL, HDL, LDLCALC,  TRIG, CHOLHDL, LDLDIRECT in the last 72 hours. Thyroid Function Tests: No results for input(s): TSH, T4TOTAL, FREET4, T3FREE, THYROIDAB in the last 72 hours. Anemia Panel: No results for input(s): VITAMINB12, FOLATE, FERRITIN, TIBC, IRON, RETICCTPCT in the last 72 hours. Sepsis Labs: Recent Labs  Lab 02/26/21 0241 02/26/21 0403 02/26/21 1215 02/27/21 0026  PROCALCITON  --   --   --  <0.10  LATICACIDVEN 1.6 3.7* 1.1  --     Recent Results (from the past 240 hour(s))  Resp Panel by RT-PCR (Flu A&B, Covid) Nasopharyngeal Swab     Status: None   Collection Time: 02/26/21  2:13 AM   Specimen: Nasopharyngeal Swab; Nasopharyngeal(NP) swabs in vial transport medium  Result Value Ref Range Status   SARS Coronavirus 2 by RT PCR NEGATIVE NEGATIVE Final    Comment: (NOTE) SARS-CoV-2 target nucleic acids are NOT DETECTED.  The SARS-CoV-2 RNA is generally detectable in upper respiratory specimens  during the acute phase of infection. The lowest concentration of SARS-CoV-2 viral copies this assay can detect is 138 copies/mL. A negative result does not preclude SARS-Cov-2 infection and should not be used as the sole basis for treatment or other patient management decisions. A negative result may occur with  improper specimen collection/handling, submission of specimen other than nasopharyngeal swab, presence of viral mutation(s) within the areas targeted by this assay, and inadequate number of viral copies(<138 copies/mL). A negative result must be combined with clinical observations, patient history, and epidemiological information. The expected result is Negative.  Fact Sheet for Patients:  EntrepreneurPulse.com.au  Fact Sheet for Healthcare Providers:  IncredibleEmployment.be  This test is no t yet approved or cleared by the [place] FDA and  has been authorized for detection and/or diagnosis of SARS-CoV-2 by FDA under an Emergency Use Authorization (EUA). This EUA will remain  in effect (meaning this test can be used) for the duration of the COVID-19 declaration under Section 564(b)(1) of the Act, 21 U.S.C.section 360bbb-3(b)(1), unless the authorization is terminated  or revoked sooner.       Influenza A by PCR NEGATIVE NEGATIVE Final   Influenza B by PCR NEGATIVE NEGATIVE Final    Comment: (NOTE) The Xpert Xpress SARS-CoV-2/FLU/RSV plus assay is intended as an aid in the diagnosis of influenza from Nasopharyngeal swab specimens and should not be used as a sole basis for treatment. Nasal washings and aspirates are unacceptable for Xpert Xpress SARS-CoV-2/FLU/RSV testing.  Fact Sheet for Patients: EntrepreneurPulse.com.au  Fact Sheet for Healthcare Providers: IncredibleEmployment.be  This test is not yet approved or cleared by the [place] FDA and has been authorized for detection  and/or diagnosis of SARS-CoV-2 by FDA under an Emergency Use Authorization (EUA). This EUA will remain in effect (meaning this test can be used) for the duration of the COVID-19 declaration under Section 564(b)(1) of the Act, 21 U.S.C. section 360bbb-3(b)(1), unless the authorization is terminated or revoked.  Performed at [facility], [place] [address] [place], [place] [address]   Blood Culture (routine x 2)     Status: None   Collection Time: 02/26/21  2:59 AM   Specimen: BLOOD RIGHT FOREARM  Result Value Ref Range Status   Specimen Description BLOOD RIGHT FOREARM  Final   Special Requests   Final    BOTTLES DRAWN AEROBIC AND ANAEROBIC Blood Culture adequate volume   Culture   Final    NO GROWTH 5 DAYS Performed at [facility], [place] [address], [place], [place] [address]  Report Status 03/03/2021 FINAL  Final  MRSA Next Gen by PCR, Nasal     Status: None   Collection Time: 02/26/21  6:04 AM   Specimen: Nasal Mucosa; Nasal Swab  Result Value Ref Range Status   MRSA by PCR Next Gen NOT DETECTED NOT DETECTED Final    Comment: (NOTE) The GeneXpert MRSA Assay (FDA approved for NASAL specimens only), is one component of a comprehensive MRSA colonization surveillance program. It is not intended to diagnose MRSA infection nor to guide or monitor treatment for MRSA infections. Test performance is not FDA approved in patients less than 58 years old. Performed at [facility], [place] [address], [place], [place] [address]   Blood Culture (routine x 2)     Status: None   Collection Time: 02/26/21 12:15 PM   Specimen: BLOOD RIGHT HAND  Result Value Ref Range Status   Specimen Description BLOOD RIGHT HAND  Final   Special Requests   Final    BOTTLES DRAWN AEROBIC AND ANAEROBIC Blood Culture adequate volume   Culture   Final    NO GROWTH 5 DAYS Performed at [facility], [place] [address], [place], [place] [address]    Report Status 03/03/2021 FINAL  Final   Aerobic/Anaerobic Culture w Gram Stain (surgical/deep wound)     Status: None (Preliminary result)   Collection Time: 02/26/21  4:00 PM   Specimen: Pleural Fluid  Result Value Ref Range Status   Specimen Description FLUID PLEURAL RIGHT  Final   Special Requests NONE  Final   Gram Stain   Final    RARE WBC PRESENT,BOTH PMN AND MONONUCLEAR NO ORGANISMS SEEN    Culture   Final    NO GROWTH 5 DAYS NO ANAEROBES ISOLATED; CULTURE IN PROGRESS FOR 5 DAYS Performed at [facility], [place] [address] [place], [place] [address]    Report Status PENDING  Incomplete  Surgical pcr screen     Status: None   Collection Time: 02/28/21  7:59 AM   Specimen: Nasal Mucosa; Nasal Swab  Result Value Ref Range Status   MRSA, PCR NEGATIVE NEGATIVE Final   Staphylococcus aureus NEGATIVE NEGATIVE Final    Comment: (NOTE) The Xpert SA Assay (FDA approved for NASAL specimens in patients 74 years of age and older), is one component of a comprehensive surveillance program. It is not intended to diagnose infection nor to guide or monitor treatment. Performed at [facility], [place] [address], [place], [place] [address]          Radiology Studies: DG CHEST PORT 1 VIEW  Result Date: 03/04/2021 CLINICAL DATA:  Follow-up exam. Edema. Status post chest tube. Status post right video-assisted lobectomy. EXAM: PORTABLE CHEST 1 VIEW COMPARISON:  03/03/2021 and older exams. FINDINGS: Since the earlier study, the small caliber right chest tube has been removed. No pneumothorax. Postsurgical changes in the right upper lung/hemithorax are stable. Lungs demonstrate bilateral interstitial opacities similar to the previous day's exam. No new lung abnormalities. No convincing pleural effusion. IMPRESSION: 1. Status post removal of the right-sided chest tube. 2. No pneumothorax. 3. No other change from the previous day's study. Electronically Signed   By: Lajean Manes M.D.   On: 03/04/2021 09:22   DG Chest Port 1  View  Result Date: 03/03/2021 CLINICAL DATA:  60 year old male with history of HTN, stage III adenocarcinoma of lung, depression, recent admission 5/16-5/20 during which he underwent right upper lobe lobectomy and chest wall dissection(VATS) by  Dr. Roxan Hockey, presented with severe cough, bright red hemoptysis, shortness of breath.Chest CT revealed diffuse infiltrates concerning for alveolar hemorrhage with possible superimposed infectious process/pulmonary edema. Patient was started on broad-spectrum antibiotics. EXAM: PORTABLE CHEST 1 VIEW COMPARISON:  03/02/2021 and older exams. FINDINGS: Postsurgical changes at the right lung apex are stable as is the small caliber right upper hemithorax chest tube. Interstitial lung opacities are similar to the prior study. Hazy airspace opacities noted previously have improved. No new lung abnormalities. No convincing pneumothorax or pleural effusion. IMPRESSION: 1. Improved airspace lung opacities compared to exams dating back to 02/26/2021. Mild persistent interstitial lung opacities. No new lung abnormalities. 2. Stable post surgical changes in the right upper hemithorax. No pneumothorax. Electronically Signed   By: Lajean Manes M.D.   On: 03/03/2021 08:15        Scheduled Meds:  amLODipine  10 mg Oral Daily   calcium carbonate  1 tablet Oral TID   gabapentin  400 mg Oral TID   ipratropium-albuterol  3 mL Nebulization TID   irbesartan  300 mg Oral Daily   ketorolac  30 mg Intravenous Q6H   latanoprost  1 drop Both Eyes QHS   methylPREDNISolone (SOLU-MEDROL) injection  40 mg Intravenous Q12H   potassium chloride  20 mEq Oral Once   QUEtiapine  50 mg Oral QHS   sertraline  100 mg Oral Daily   Continuous Infusions:  ceFEPime (MAXIPIME) IV Stopped (03/04/21 0335)   promethazine (PHENERGAN) injection (IM or IVPB)       LOS: 6 days    Time spent: 35 min    Nicolette Bang, MD [facility]  If 7PM-7AM, please contact  night-coverage  03/04/2021, 11:17 AM

## 2021-03-04 NOTE — Plan of Care (Signed)

## 2021-03-05 DIAGNOSIS — J9601 Acute respiratory failure with hypoxia: Secondary | ICD-10-CM

## 2021-03-05 LAB — CBC WITH DIFFERENTIAL/PLATELET
Abs Immature Granulocytes: 0.13 10*3/uL — ABNORMAL HIGH (ref 0.00–0.07)
Basophils Absolute: 0 10*3/uL (ref 0.0–0.1)
Basophils Relative: 0 %
Eosinophils Absolute: 0 10*3/uL (ref 0.0–0.5)
Eosinophils Relative: 0 %
HCT: 30.2 % — ABNORMAL LOW (ref 39.0–52.0)
Hemoglobin: 10.1 g/dL — ABNORMAL LOW (ref 13.0–17.0)
Immature Granulocytes: 1 %
Lymphocytes Relative: 5 %
Lymphs Abs: 0.8 10*3/uL (ref 0.7–4.0)
MCH: 30.5 pg (ref 26.0–34.0)
MCHC: 33.4 g/dL (ref 30.0–36.0)
MCV: 91.2 fL (ref 80.0–100.0)
Monocytes Absolute: 1.5 10*3/uL — ABNORMAL HIGH (ref 0.1–1.0)
Monocytes Relative: 9 %
Neutro Abs: 13.4 10*3/uL — ABNORMAL HIGH (ref 1.7–7.7)
Neutrophils Relative %: 85 %
Platelets: 480 10*3/uL — ABNORMAL HIGH (ref 150–400)
RBC: 3.31 MIL/uL — ABNORMAL LOW (ref 4.22–5.81)
RDW: 14.4 % (ref 11.5–15.5)
WBC: 15.8 10*3/uL — ABNORMAL HIGH (ref 4.0–10.5)
nRBC: 0 % (ref 0.0–0.2)

## 2021-03-05 LAB — BASIC METABOLIC PANEL
Anion gap: 7 (ref 5–15)
BUN: 19 mg/dL (ref 6–20)
CO2: 27 mmol/L (ref 22–32)
Calcium: 8.8 mg/dL — ABNORMAL LOW (ref 8.9–10.3)
Chloride: 103 mmol/L (ref 98–111)
Creatinine, Ser: 0.6 mg/dL — ABNORMAL LOW (ref 0.61–1.24)
GFR, Estimated: >60 mL/min (ref >60)
Glucose, Bld: 222 mg/dL — ABNORMAL HIGH (ref 70–99)
Potassium: 4.1 mmol/L (ref 3.5–5.1)
Sodium: 137 mmol/L (ref 135–145)

## 2021-03-05 MED ORDER — PREDNISONE 20 MG PO TABS: 40.0000 mg | ORAL_TABLET | Freq: Every day | ORAL | Status: DC

## 2021-03-05 MED ORDER — KETOROLAC TROMETHAMINE 30 MG/ML IJ SOLN
30.0000 mg | Freq: Four times a day (QID) | INTRAMUSCULAR | Status: DC | PRN
  Administered 2021-03-05 – 2021-03-06 (×6): 30 mg via INTRAVENOUS
  Filled 2021-03-05 (×7): qty 1

## 2021-03-05 MED ORDER — PREDNISONE 20 MG PO TABS
40.0000 mg | ORAL_TABLET | Freq: Every day | ORAL | Status: DC
  Administered 2021-03-05 – 2021-03-07 (×3): 40 mg via ORAL
  Filled 2021-03-05 (×3): qty 2

## 2021-03-05 NOTE — Progress Notes (Signed)
Procedure(s) (LRB): VIDEO BRONCHOSCOPY (N/A) Subjective: Feels better, still has pain- fentanyl seems to work the best, dilaudid an improvement over tramadol  Objective: Vital signs in last 24 hours: Temp:  [98 F (36.7 C)-98.7 F (37.1 C)] 98 F (36.7 C) (06/27 0420) Pulse Rate:  [54-85] 54 (06/27 0420) Cardiac Rhythm: Normal sinus rhythm (06/27 0739) Resp:  [16-24] 19 (06/27 0420) BP: (125-152)/(63-78) 137/78 (06/27 0420) SpO2:  [92 %-98 %] 96 % (06/27 0420) FiO2 (%):  [40 %] 40 % (06/26 1144)  Hemodynamic parameters for last 24 hours:    Intake/Output from previous day: 06/26 0701 - 06/27 0700 In: 840 [P.O.:840] Out: 600 [Urine:600] Intake/Output this shift: No intake/output data recorded.  General appearance: alert, cooperative, and no distress Neurologic: intact  Lab Results: Recent Labs    03/04/21 0052 03/05/21 0013  WBC 19.0* 15.8*  HGB 10.2* 10.1*  HCT 30.4* 30.2*  PLT 426* 480*   BMET:  Recent Labs    03/04/21 0052 03/05/21 0013  NA 137 137  K 3.6 4.1  CL 104 103  CO2 26 27  GLUCOSE 189* 222*  BUN 22* 19  CREATININE 0.61 0.60*  CALCIUM 9.0 8.8*    PT/INR: No results for input(s): LABPROT, INR in the last 72 hours. ABG    Component Value Date/Time   PHART 7.458 (H) 02/26/2021 0630   HCO3 28.7 (H) 02/26/2021 0630   TCO2 30 02/26/2021 0630   ACIDBASEDEF 1.3 01/18/2021 1504   O2SAT 96.0 02/26/2021 0630   CBG (last 3)  No results for input(s): GLUCAP in the last 72 hours.  Assessment/Plan: S/P Procedure(s) (LRB): VIDEO BRONCHOSCOPY (N/A) - Looks much better Down to 6L [place] Was able to ambulate a good distance yesterday Need to streamline pain regimen for possible dc Continue Dilaudid, only use Fentanyl as needed for "rescue". Change toradol to PRN   LOS: 7 days    [name] 03/05/2021

## 2021-03-05 NOTE — Plan of Care (Signed)

## 2021-03-05 NOTE — Plan of Care (Signed)

## 2021-03-05 NOTE — Progress Notes (Addendum)
NAME:  Christopher Carter, MRN:  [id number], DOB:  Aug 05, 1961, LOS: 7 ADMISSION DATE:  02/25/2021, CONSULTATION DATE:  6/20 REFERRING MD:  Dwyane Dee, CHIEF COMPLAINT:  Respiratory failure and hemoptysis    History of Present Illness:  60 year old non-smoker, rides ~ 30 miles per day on mountain bike.  Recently status post right upper lobe lobectomy with node dissection 5/16 for T4, N0, stage IIIa adenocarcinoma.  Surgical notes stated there was still residual tumor posterior near spine which was unable to be resected.  He is yet to undergo chemo or radiation.  Had been recovering slowly with expected postoperative pain.  Started to note decreased energy, more fatigue, overall malaise sometime around 6/15.  Had been seen the day prior by cardiothoracic's who noted loculated right upper lobe collection from surgical site for which plan CT-guided aspiration was planned for 6/21.  On 6/19 he continued to feel weak and fatigued, around midday he began to cough up blood.  Noting about a tablespoon at a time, reporting it is being bright red, and at its worst up to 6 times an hour.  At times the cough would be so intense that it would make him nauseated and he would also vomit because of this, and worsening shortness of breath he reported to the emergency room around 8 PM on 6/19 .  In the ER he was found to be hypoxic requiring titration of oxygen up to 100% on heated high flow.  Initial CT imaging on 6/20 was negative for pulmonary embolus, there is a loculated moderate size right apical pleural fluid collection, diffuse bilateral airspace disease, and subacute right fifth and nondisplaced sixth rib fractures secondary to prior thoracotomy.  He was admitted by the internal medicine service, cardiothoracic surgery is already consulted, because of the supplemental oxygen need pulmonary asked to evaluate.  Pertinent  Medical History  Hypertension, depression.  Right upper lobe lung mass status post thoracotomy and  right upper lobectomy with node dissection 5/16.  Mass noted to be invading the chest wall with positive margins posteriorly near the spine.  He is T4, N0, stage IIIa adenocarcinoma.  Surgical notes document residual tumor posteriorly near the spine which was unable to be resected.  He has yet to undergo adjuvant radiation or chemo Last seen in cardiothoracic clinic on 6/17 at which time imaging noted to see a new loculated right upper lobe fluid collection and there was plan to proceed with CT-guided aspiration  Significant Hospital Events: Including procedures, antibiotic start and stop dates in addition to other pertinent events   6/19 admitted with new hemoptysis, fatigue, and worsening shortness of breath.  Found to be hypoxic on arrival requiring titration of supplemental oxygen up to heated high flow.  Started on azithromycin, ceftriaxone, and vancomycin  6/20   CT imaging obtained showing previously identified loculated right apical pleural effusion, negative for pulmonary emboli.  There is diffuse alveolar airspace disease.  Pulmonary asked to evaluate.  Interventional radiology consulted 6/20 IR placed anterior chestube into loculated effusion 6/24 started steroids 6/25 chest tube DC'd 6/27 O2 needs down to 6L (from 15L 6/26)  Interim History / Subjective:  Afebrile  O2 2L  Remains on cefepime, solumedrol 40 mg BID  Pt reports feeling better overall   Objective   Blood pressure (!) 145/69, pulse 77, temperature 98.3 F (36.8 C), temperature source Oral, resp. rate (!) 24, height 5' 10" (1.778 m), weight 83.9 kg, SpO2 96 %.    FiO2 (%):  [40 %] 40 %  Intake/Output Summary (Last 24 hours) at 03/05/2021 1104 Last data filed at 03/05/2021 0651 Gross per 24 hour  Intake 360 ml  Output 600 ml  Net -240 ml   Filed Weights   02/26/21 0208  Weight: 83.9 kg    Examination: General: adult male lying in bed in NAD, wife at bedside  HEENT: MM pink/moist, anicteric Neuro: AAOx4,  speech clear, MAE  CV: s1s2 RRR, SR with occasional PVC's noted on monitor, no m/r/g PULM: non-labored on O2, lungs bilaterally clear anterior, diminished lower GI: soft, bsx4 active  Extremities: warm/dry, no edema  Skin: no rashes or lesions  Resolved Hospital Problem list     Assessment & Plan:   Acute hypoxic respiratory failure (POA) in the setting of diffuse pulmonary infiltrates with associated hemoptysis Concern for alveolar hemorrhage versus aspiration induced damage.  Initially required heated high flow, O2 needs improved.  S/p chest tube placement, RUL anterior bloody fluid collection, removed 6/25.  -continue cefepime, stop date in place for 6/27 -wean O2 for sats >90% -transition solumedrol to prednisone 40 mg QD with taper off, continue steroids for 2 weeks total duration -leukocytosis likely from steroids -will need ambulatory O2 assessment prior to discharge, anticipate he may need 2L at rest, 4L with exertion in the short term -follow up with pulmonary as arranged, consider repeat follow up of O2 needs at time of office visit   RUL Loculated Pleural Dffusion S/p chest tube, discontinued 6/25 -follow imaging intermittently   Stage IIIa adenocarcinoma of the lung status post right upper lobe lobectomy, yet to undergo adjunctive therapy Molecular markers show met 1 and PD-L1 expression. Pleural involvement upgrades him to Stage IV -follow up with ONC as outpatient  -PT efforts for deconditioning  -diet encouraged   Anemia  -per primary   Hyperglycemia  -per primary    PCCM will be available PRN. Please call back if new needs arise.    Brandi Ollis, MSN, APRN, NP-C, AGACNP-BC [facility] 03/05/2021, 11:04 AM   Please see [email or web address] for pager details.   From 7A-7P if no response, please call [phone] After hours, please call ELink [phone]   Pulmonary/Critical care attending attestation note:  Patient seen and examined and  relevant ancillary tests reviewed.  I agree with the assessment and plan of care as outlined by Brandi Ollis, NP.   Recent diagnosis lung cancer admitted with hypoxemia and loculated pleural effusion. S/p chest tube now removed. Improving. Steroids seemed to help. CT with diffuse GGOs on right. Query inflammatory reaction to effusion. Chronically elevated eosinophils so query eosinophilic process or other inflammatory process. No role for bronch at this time given yield low while on steroids. Fortunate he has had improvement.   Synopsis of assessment and plan:  Acute Hypoxemic respiratory failure: Effusion with infiltrates. Improving on steroids. Differential as above. --Prednisone 40 mg x 5 days, then 20 mg x 5 days, then 10 mg x 5 days then off --May need O2 at d/c recommend ambulation so can counsel on level needed with exertion --Pulmonary f/u arranged, would likely benefit from repeat CT in 4-6 weeks  CRITICAL CARE N/a   R , MD See Amion for contact info  03/05/2021, 4:43 PM   

## 2021-03-05 NOTE — Progress Notes (Signed)
PROGRESS NOTE    Christopher Carter  SWH:[id number] DOB: [birth year] DOA: 02/25/2021 PCP: [name], MD   Brief Narrative:  Patient is a 60 year old male with history of HTN, stage III adenocarcinoma of lung, depression, recent admission 5/16-5/20 during which he underwent right upper lobe lobectomy and chest wall dissection(VATS) by Dr. Roxan Hockey, presented with severe cough, bright red hemoptysis, shortness of breath.   Chest CT revealed diffuse infiltrates concerning for alveolar hemorrhage with possible superimposed infectious process/pulmonary edema.   Patient was started on broad-spectrum antibiotics. PCCM/IR consulted and he underwent right-sided chest tube placement for hemothorax.   Cardiothoracic surgery also consulting.   Patient currently being managed for acute hypoxic respiratory failure requiring high flow oxygen, possible superimposed pneumonia   Assessment & Plan:   Principal Problem:   Acute respiratory failure with hypoxia (HCC) Active Problems:   Essential hypertension   Pulmonary alveolar hemorrhage   Adenocarcinoma of right lung (HCC)   Pneumonia of right lung due to infectious organism   Sepsis due to pneumonia ([place])   Major depressive disorder Acute hypoxic respiratory failure present on admission. Right upper lobe loculated pleural effusion Hemothorax concern for alveolar hemorrhage Recent right upper lobectomy for stage III adenocarcinoma. Severe sepsis secondary to healthcare associated pneumonia Presented with hemoptysis, cough, shortness of breath requiring supplemental oxygen.  Met SIRS criteria on admission with tachycardia, tachypnea, leukocytosis.  Endorgan damage with lactic acidosis. His hypoxia worsened during the hospitalization and currently he is on high flow oxygen 25L 60%> 25L 40% t> 6L this AM,  Acute hypoxic respite failure is multifactorial secondary to progressive alveolar hemorrhage, possible superimposed pneumonia.   Pigtail chest tube placement by IR on 5/21.> d/c 6/25 Cardiothoracic surgery consulted. Pulmonary consulted as well.  Patient may require bronchoscopy when 02 requirement stabilizes. Started on broad-spectrum IV biotics, currently on IV cefepime-PLAN FOR 7 days finish course 6/27 Cultures so far negative without any growth. Leukocytosis likley related to steroids Chest x-ray on 6/24 shows increasing congestion, started on IV Lasix x1 with significant improvement respiratory status-since cxr improving, recheck in AM Also IV steroids were added 6-24 to aid in healing.- AS 02 SATS STABILIZE START TO WEAN DOWN, QILL NWEED 2 WEEKS PER PULM Monitoring PCCM and cardiothoracic surgery recommendation.    Pain control. Anxiety Remains to be one of the main issue, but on my eval this AM appears to continue improving Will narrow down pain regimen C/W PO Dilaudid D/C toradol PRN fentanyl May benefit from being on benzodiazepine.   Lactic acidosis:  Resolved with IV fluids   Accelerated hypertension Imporved control On Avapro, amlodipine   History of depression:  Continue home medications   Adenocarcinoma of the right lung:  Diagnosed in April 2022, follows with Dr. Julien Nordmann.  Status post recent VATS with right upper lobe lobectomy and chest wall dissection by cardiothoracic surgery, Dr. Koleen Nimrod. Needs outpatient follow-up.   Hypokalemia:  Supplemented with potassium. 4.1  on 6/27 Recheck in AM  Insomnia. Ambien.   Body mass index is 26.54 kg/m.    DVT prophylaxis: SCD/Compression stockings  Code Status: full     Code Status Orders  (From admission, onward)           Start     Ordered   02/26/21 0612  Full code  Continuous        02/26/21 0613           Code Status History     Date Active Date Inactive Code Status Order ID  Comments User Context   01/22/2021 1529 01/26/2021 1518 Full Code [id number]  [facility] Aschoff Inpatient      Family Communication:  Discussed with pt in detail today Disposition Plan:   TBD, no clinically stable for d/c Consults called:  cards pccm Admission status: Inpatient Status is: Inpatient   Remains inpatient appropriate because:Inpatient level of care appropriate due to severity of illness   Dispo: The patient is from: Home              Anticipated d/c is to: Home              Patient currently is not medically stable to d/c.              Difficult to place patient No   Consultants:  As above  Procedures:  DG Chest 2 View  Result Date: 02/25/2021 CLINICAL DATA:  History of right partial lobectomy with hemoptysis and increasing shortness of breath EXAM: CHEST - 2 VIEW COMPARISON:  02/20/2021 FINDINGS: Cardiac shadow is stable. Left lung remains clear. Right lung demonstrates right-sided pleural effusion and masslike density superiorly with postsurgical changes. This represents pleural fluid loculated from prior resection and stable in appearance. Some increasing parenchymal density is noted throughout the aerated lung which may represent hemorrhage given the history of hemoptysis. No new mass lesion is seen. IMPRESSION: Stable masslike density in the right apex consisting of loculated fluid following recent surgery. Increasing interstitial opacity within the right lung which may represent some hemorrhage given the hemoptysis. Small basilar right sided effusion. Electronically Signed   By: Inez Catalina M.D.   On: 02/25/2021 22:27   DG Chest 2 View  Result Date: 02/20/2021 CLINICAL DATA:  Right upper lobe mass EXAM: CHEST - 2 VIEW COMPARISON:  02/13/2021 FINDINGS: Cardiac shadow is stable. Volume loss on the right is noted with small effusions stable from the prior exam. Postoperative changes are noted in the right apex with persistent soft tissue mass lesion stable from the prior exam. Left lung remains clear. Posterior fracture of the right fifth rib is noted stable from previous exam. IMPRESSION: Stable right  upper lobe mass lesion.  Postsurgical changes are seen. Electronically Signed   By: Inez Catalina M.D.   On: 02/20/2021 14:32   DG Chest 2 View  Result Date: 02/13/2021 CLINICAL DATA:  Status post thoracotomy and VATS procedure EXAM: CHEST - 2 VIEW COMPARISON:  Jan 26, 2021 FINDINGS: There is a masslike area in the right upper lobe extending to the apex with surgical clips in this area. This masslike area measures 8.0 x 8.1 cm. There is a right pleural effusion with volume loss on the right. Left lung is clear. Heart size and pulmonary vascularity normal. No adenopathy no bone lesions. IMPRESSION: 8.1 x 8.0 cm masslike area right upper lobe toward the apex. Suspect postoperative loculated fluid hematoma in this area is most likely etiology given the dramatic change compared to the study from approximately 2 weeks prior. There is a small right pleural effusion there is right base volume loss. Left lung is clear. Heart size normal. Electronically Signed   By: Lowella Grip III M.D.   On: 02/13/2021 14:31   CT CHEST WO CONTRAST  Result Date: 02/16/2021 CLINICAL DATA:  History of right upper lobe lung cancer status post right upper lobe lobectomy. Follow-up postoperative right apical fluid collection. EXAM: CT CHEST WITHOUT CONTRAST TECHNIQUE: Multidetector CT imaging of the chest was performed following the standard protocol without IV contrast.  COMPARISON:  PET-CT 12/13/2020 and chest x-ray 02/13/2021 FINDINGS: Cardiovascular: The heart is normal in size peer no pericardial effusion. Stable age advanced coronary artery calcifications. No definite aortic calcifications. Mediastinum/Nodes: Stable scattered sub 8 mm mediastinal lymph nodes. No new or progressive findings. The esophagus is grossly normal. Lungs/Pleura: Surgical changes from a right upper lobe lobectomy. There is a moderate-sized right pleural effusion with loculated fluid at the right lung apex. Small amount of residual pleural air is also  noted. No worrisome pulmonary nodules to suggest pulmonary metastatic disease. Upper Abdomen: No significant upper abdominal findings. No hepatic or adrenal gland lesions are identified. Musculoskeletal: No significant bony findings. IMPRESSION: 1. Surgical changes from a right upper lobe lobectomy. 2. Moderate-sized right pleural effusion with loculated fluid at the right lung apex. 3. No worrisome pulmonary nodules to suggest pulmonary metastatic disease. 4. Stable age advanced coronary artery calcifications. Aortic Atherosclerosis (ICD10-I70.0) Electronically Signed   By: Marijo Sanes M.D.   On: 02/16/2021 16:27   CT Angio Chest PE W and/or Wo Contrast  Result Date: 02/26/2021 CLINICAL DATA:  hx of R partial lobectomy, has been coughing up blood since about noon, increased shortness of breath, denies home 02, now on 3L [place]. Pulmonary embolus suspected. VATS and thoracotomy surgery 01/22/2021 EXAM: CT ANGIOGRAPHY CHEST WITH CONTRAST TECHNIQUE: Multidetector CT imaging of the chest was performed using the standard protocol during bolus administration of intravenous contrast. Multiplanar CT image reconstructions and MIPs were obtained to evaluate the vascular anatomy. CONTRAST:  61m OMNIPAQUE IOHEXOL 350 MG/ML SOLN COMPARISON:  CT chest 02/16/2021 FINDINGS: Cardiovascular: Satisfactory opacification of the pulmonary arteries to the segmental level. No evidence of pulmonary embolism. Normal heart size. No pericardial effusion. The thoracic aorta is normal in caliber. Coronary artery calcifications are noted. Mediastinum/Nodes: No enlarged mediastinal, hilar, or axillary lymph nodes. Thyroid gland, trachea, and esophagus demonstrate no significant findings. Lungs/Pleura: Surgical changes related to a partial right lobectomy of the right upper lobe with associated loculated right apical moderate-sized pleural effusion. Persistent trace basilar pleural effusion. Diffuse, right greater than left as well as lower  lobe greater than upper lobe, peribronchovascular ground-glass and consolidative opacities. Some peripheral airspace opacities are also noted. Upper Abdomen: No acute abnormality. Musculoskeletal: No chest wall abnormality. No suspicious lytic or blastic osseous lesions. No acute displaced fracture. Redemonstration of subacute right comminuted fifth as well as right nondisplaced sixth rib fractures that are broken in 2 separate places. Multilevel degenerative changes of the spine. Review of the MIP images confirms the above findings. IMPRESSION: 1. No pulmonary embolus. 2. Diffuse pulmonary findings findings suggestive of alveolar hemorrhage versus infection/inflammation versus pulmonary edema. 3. Similar-appearing loculated moderate sized right apical pleural effusion with limited evaluation due to timing of intravenous contrast. Persistent trace right basilar pleural effusion. 4. Subacute right comminuted fifth as well as right nondisplaced sixth rib fractures that are broken in 2 separate places consistent with history of thoracotomy. 5. Coronary artery calcifications. Electronically Signed   By: MIven FinnM.D.   On: 02/26/2021 04:59   DG CHEST PORT 1 VIEW  Result Date: 03/04/2021 CLINICAL DATA:  Follow-up exam. Edema. Status post chest tube. Status post right video-assisted lobectomy. EXAM: PORTABLE CHEST 1 VIEW COMPARISON:  03/03/2021 and older exams. FINDINGS: Since the earlier study, the small caliber right chest tube has been removed. No pneumothorax. Postsurgical changes in the right upper lung/hemithorax are stable. Lungs demonstrate bilateral interstitial opacities similar to the previous day's exam. No new lung abnormalities. No convincing pleural effusion.  IMPRESSION: 1. Status post removal of the right-sided chest tube. 2. No pneumothorax. 3. No other change from the previous day's study. Electronically Signed   By: Lajean Manes M.D.   On: 03/04/2021 09:22   DG Chest Port 1 View  Result  Date: 03/03/2021 CLINICAL DATA:  60 year old male with history of HTN, stage III adenocarcinoma of lung, depression, recent admission 5/16-5/20 during which he underwent right upper lobe lobectomy and chest wall dissection(VATS) by Dr. Roxan Hockey, presented with severe cough, bright red hemoptysis, shortness of breath.Chest CT revealed diffuse infiltrates concerning for alveolar hemorrhage with possible superimposed infectious process/pulmonary edema. Patient was started on broad-spectrum antibiotics. EXAM: PORTABLE CHEST 1 VIEW COMPARISON:  03/02/2021 and older exams. FINDINGS: Postsurgical changes at the right lung apex are stable as is the small caliber right upper hemithorax chest tube. Interstitial lung opacities are similar to the prior study. Hazy airspace opacities noted previously have improved. No new lung abnormalities. No convincing pneumothorax or pleural effusion. IMPRESSION: 1. Improved airspace lung opacities compared to exams dating back to 02/26/2021. Mild persistent interstitial lung opacities. No new lung abnormalities. 2. Stable post surgical changes in the right upper hemithorax. No pneumothorax. Electronically Signed   By: Lajean Manes M.D.   On: 03/03/2021 08:15   DG Chest Port 1 View  Result Date: 03/02/2021 CLINICAL DATA:  VATS/right upper lobectomy.  Right chest tube. EXAM: PORTABLE CHEST 1 VIEW COMPARISON:  03/01/2021 FINDINGS: Stable position of the right apical chest tube. Residual pleural based densities at the right lung apex with postsurgical changes. Stable volume loss in the right hemithorax. Hazy interstitial lung densities bilaterally that have minimally changed. Heart size is stable. IMPRESSION: 1. Stable positioning of the right apical small bore chest tube. Aeration at the right lung apex has markedly improved since chest tube placement but there are residual densities in this area. Negative for a large pneumothorax. 2. Diffuse interstitial lung densities that could  represent interstitial pulmonary edema. Findings are similar to the recent comparison examination. Electronically Signed   By: Markus Daft M.D.   On: 03/02/2021 11:04   DG Chest Port 1 View  Result Date: 03/01/2021 CLINICAL DATA:  Status post right lung lobectomy. EXAM: PORTABLE CHEST 1 VIEW COMPARISON:  02/28/2021.  02/27/2021. FINDINGS: Right chest tube in stable position. Stable postsurgical changes right apex. Tiny residual right apical pneumothorax cannot be completely excluded. Bilateral interstitial infiltrates/edema, slightly progressed on the left from prior exam. No prominent pleural effusion. Heart size stable. IMPRESSION: 1. Right apical chest tube in stable position. Stable postsurgical changes right apex. Tiny residual right apical pneumothorax cannot be completely excluded. 2. Bilateral interstitial infiltrates/edema slightly progressed on the left from prior exam. Electronically Signed   By: Naranja   On: 03/01/2021 08:36   DG Chest Port 1 View  Result Date: 02/28/2021 CLINICAL DATA:  Chest tube.  Shortness of breath. EXAM: PORTABLE CHEST 1 VIEW COMPARISON:  02/27/2021. FINDINGS: Surgical clips right chest. Right chest tube in stable position over the right apex. Stable right apical pleural thickening and tiny pneumothorax noted. Stable right lateral pleural thickening. Bilateral pulmonary infiltrates/edema, improved from prior exam. No acute bony abnormality. IMPRESSION: 1. Surgical clips right chest. Right chest tube in stable position over the right apex. Stable right apical pleural thickening and tiny pneumothorax noted. Stable right lateral pleural thickening. 2.  Bilateral pulmonary infiltrates/edema, improved from prior exam. Electronically Signed   By: Emmet   On: 02/28/2021 09:50   DG Chest Port 1  View  Result Date: 02/27/2021 CLINICAL DATA:  New hemoptysis, fatigue, worsening shortness of breath, hypoxia EXAM: PORTABLE CHEST 1 VIEW COMPARISON:  Portable exam  0835 hours compared to 02/25/2021 FINDINGS: Pigtail thoracostomy tube at RIGHT apex with decrease in loculated pleural effusion. Small loculated pneumothorax at RIGHT apex. Stable heart size and mediastinal contours. BILATERAL pulmonary infiltrates in the mid to lower lungs, increased on LEFT since previous exam. No acute osseous findings. IMPRESSION: Small residual loculated hydropneumothorax at RIGHT apex post chest tube insertion. BILATERAL mid to lower lung pulmonary infiltrates, slightly increased. Electronically Signed   By: Lavonia Dana M.D.   On: 02/27/2021 11:20   CT IMAGE GUIDED DRAINAGE BY PERCUTANEOUS CATHETER  Result Date: 02/26/2021 INDICATION: 60 year old male with a history recent surgery, presentation with hemoptysis, and loculated fluid of the surgery site. He has been referred for CT-guided chest tube placement EXAM: CT-GUIDED CHEST TUBE MEDICATIONS: The patient is currently admitted to the hospital and receiving intravenous antibiotics. The antibiotics were administered within an appropriate time frame prior to the initiation of the procedure. ANESTHESIA/SEDATION: 1.0 mg IV Versed 25 mcg IV Fentanyl Moderate Sedation Time:  16 minutes The patient was continuously monitored during the procedure by the interventional radiology nurse under my direct supervision. COMPLICATIONS: None TECHNIQUE: Informed written consent was obtained from the patient after a thorough discussion of the procedural risks, benefits and alternatives. All questions were addressed. Maximal Sterile Barrier Technique was utilized including caps, mask, sterile gowns, sterile gloves, sterile drape, hand hygiene and skin antiseptic. A timeout was performed prior to the initiation of the procedure. PROCEDURE: Patient was positioned in the supine position on the CT gantry table and a scout CT of the chest was performed for planning purposes. Once angle of approach was determined, the skin and subcutaneous tissues this scan was  prepped and draped in the usual sterile fashion, and a sterile drape was applied covering the operative field. A sterile gown and sterile gloves were used for the procedure. Local anesthesia was provided with 1% Lidocaine. The skin and subcutaneous tissues were infiltrated 1% lidocaine for local anesthesia, and a small stab incision was made with an 11 blade scalpel. Using CT guidance, a 18 gauge trocar needle was advanced into the pleural space of the right apex. After confirmation of the tip, modified Seldinger technique was used to place a 10 French pigtail drainage catheter. Approximately 60 cc of reddish thin fluid aspirated. Culture was sent to the lab for analysis. Catheter was then attached to water seal chamber and suction was applied confirming a operational chest tube. Retention suture was placed.  Sterile dressing was placed. Patient tolerated the procedure well and remained hemodynamically stable throughout. No complications were encountered and no significant blood loss was encounter FINDINGS: The scout CT demonstrates essentially a similar distribution and volume of confluent ground-glass opacity of the right lower lobe, right middle lobe, and left lower lobe. No significant fluid identified within the airways. Similar appearance of the loculated fluid collection within the surgical site at the right apex. After placement of the chest tube approximately 60 cc of thin reddish fluid aspirated which appears to be serosanguineous. No frank abscess. Culture was sent. IMPRESSION: Status post CT-guided right apical chest tube into loculated fluid revealing serosanguineous fluid. Culture was sent. Signed, Dulcy Fanny. Dellia Nims, RPVI [facility] Radiology Electronically Signed   By: Corrie Mckusick D.O.   On: 02/26/2021 17:09    Antimicrobials:  Cefepime for 7 days. Complete course  today    Subjective: Pt improving considerably, weaned down from 25L at 60% to  6Lover the last 2 days,  Still reports pain but improving  Objective: Vitals:   03/04/21 2330 03/05/21 0420 03/05/21 0755 03/05/21 0901  BP: 125/63 137/78 (!) 145/69   Pulse: 76 (!) 54 77   Resp: 18 19 (!) 24   Temp: 98.1 F (36.7 C) 98 F (36.7 C) 98.3 F (36.8 C)   TempSrc: Oral Oral Oral   SpO2: 98% 96% 94% 96%  Weight:      Height:        Intake/Output Summary (Last 24 hours) at 03/05/2021 0949 Last data filed at 03/05/2021 0651 Gross per 24 hour  Intake 360 ml  Output 600 ml  Net -240 ml   Filed Weights   02/26/21 0208  Weight: 83.9 kg    Examination:    General exam: Appears calm and comfortable this AM Respiratory system: Mild rales bilaterally, Cardiovascular system: S1 & S2 heard, RRR. No JVD, murmurs, rubs, gallops or clicks. No pedal edema. Gastrointestinal system: Abdomen is nondistended, soft and nontender. No organomegaly or masses felt. Normal bowel sounds heard. Central nervous system: Alert and oriented. No focal neurological deficits. Extremities: Warm well perfused, no edema Skin: No rashes, lesions or ulcers Psychiatry: Judgement and insight appear normal. Mood & affect appropriate.  Although appears mildly anxious-this continues to imrpove    Data Reviewed: I have personally reviewed following labs and imaging studies  CBC: Recent Labs  Lab 02/27/21 0026 02/28/21 0021 03/01/21 0023 03/02/21 1254 03/03/21 0024 03/04/21 0052 03/05/21 0013  WBC 20.7*   < > 16.7* 17.1* 13.4* 19.0* 15.8*  NEUTROABS 17.2*  --  13.0* 13.7*  --  16.8* 13.4*  HGB 11.6*   < > 11.7* 12.0* 10.9* 10.2* 10.1*  HCT 34.1*   < > 34.8* 35.2* 32.0* 30.4* 30.2*  MCV 91.9   < > 91.3 89.8 89.4 90.5 91.2  PLT 378   < > 332 366 361 426* 480*   < > = values in this interval not displayed.   Basic Metabolic Panel: Recent Labs  Lab 02/27/21 0026 02/28/21 0021 03/01/21 0023 03/02/21 1254 03/03/21 0024 03/04/21 0052 03/05/21 0013  NA 137 134* 134* 132* 137 137 137  K  3.8 3.4* 3.5 3.0* 4.1 3.6 4.1  CL 102 100 103 97* 101 104 103  CO2 _0 GLUCOSE 121* 116* 98 177* 191* 189* 222*  BUN _1 22* 19  CREATININE 0.52* 0.56* 0.53* 0.52* 0.61 0.61 0.60*  CALCIUM 9.0 8.6* 8.6* 8.5* 9.2 9.0 8.8*  MG 1.9 1.9  --  2.0 2.3  --   --   PHOS  --  2.8  --   --   --   --   --    GFR: Estimated Creatinine Clearance: 103.9 mL/min (A) (by C-G formula based on SCr of 0.6 mg/dL (L)). Liver Function Tests: Recent Labs  Lab 02/27/21 0026  AST 21  ALT 19  ALKPHOS 75  BILITOT 1.0  PROT 5.9*  ALBUMIN 3.0*   No results for input(s): LIPASE, AMYLASE in the last 168 hours. No results for input(s): AMMONIA in the last 168 hours. Coagulation Profile: Recent Labs  Lab 02/27/21 0026  INR 1.1   Cardiac Enzymes: No results for input(s): CKTOTAL, CKMB, CKMBINDEX, TROPONINI in the last 168 hours. BNP (last 3 results) No results for input(s): PROBNP in the last 8760 hours.  HbA1C: No results for input(s): HGBA1C in the last 72 hours. CBG: Recent Labs  Lab 02/26/21 1101 02/26/21 1700 02/26/21 2041  GLUCAP 135* 147* 135*   Lipid Profile: No results for input(s): CHOL, HDL, LDLCALC, TRIG, CHOLHDL, LDLDIRECT in the last 72 hours. Thyroid Function Tests: No results for input(s): TSH, T4TOTAL, FREET4, T3FREE, THYROIDAB in the last 72 hours. Anemia Panel: No results for input(s): VITAMINB12, FOLATE, FERRITIN, TIBC, IRON, RETICCTPCT in the last 72 hours. Sepsis Labs: Recent Labs  Lab 02/26/21 1215 02/27/21 0026  PROCALCITON  --  <0.10  LATICACIDVEN 1.1  --     Recent Results (from the past 240 hour(s))  Resp Panel by RT-PCR (Flu A&B, Covid) Nasopharyngeal Swab     Status: None   Collection Time: 02/26/21  2:13 AM   Specimen: Nasopharyngeal Swab; Nasopharyngeal(NP) swabs in vial transport medium  Result Value Ref Range Status   SARS Coronavirus 2 by RT PCR NEGATIVE NEGATIVE Final    Comment: (NOTE) SARS-CoV-2 target nucleic acids are NOT  DETECTED.  The SARS-CoV-2 RNA is generally detectable in upper respiratory specimens during the acute phase of infection. The lowest concentration of SARS-CoV-2 viral copies this assay can detect is 138 copies/mL. A negative result does not preclude SARS-Cov-2 infection and should not be used as the sole basis for treatment or other patient management decisions. A negative result may occur with  improper specimen collection/handling, submission of specimen other than nasopharyngeal swab, presence of viral mutation(s) within the areas targeted by this assay, and inadequate number of viral copies(<138 copies/mL). A negative result must be combined with clinical observations, patient history, and epidemiological information. The expected result is Negative.  Fact Sheet for Patients:  EntrepreneurPulse.com.au  Fact Sheet for Healthcare Providers:  IncredibleEmployment.be  This test is no t yet approved or cleared by the [place] FDA and  has been authorized for detection and/or diagnosis of SARS-CoV-2 by FDA under an Emergency Use Authorization (EUA). This EUA will remain  in effect (meaning this test can be used) for the duration of the COVID-19 declaration under Section 564(b)(1) of the Act, 21 U.S.C.section 360bbb-3(b)(1), unless the authorization is terminated  or revoked sooner.       Influenza A by PCR NEGATIVE NEGATIVE Final   Influenza B by PCR NEGATIVE NEGATIVE Final    Comment: (NOTE) The Xpert Xpress SARS-CoV-2/FLU/RSV plus assay is intended as an aid in the diagnosis of influenza from Nasopharyngeal swab specimens and should not be used as a sole basis for treatment. Nasal washings and aspirates are unacceptable for Xpert Xpress SARS-CoV-2/FLU/RSV testing.  Fact Sheet for Patients: EntrepreneurPulse.com.au  Fact Sheet for Healthcare Providers: IncredibleEmployment.be  This test is not yet  approved or cleared by the [place] FDA and has been authorized for detection and/or diagnosis of SARS-CoV-2 by FDA under an Emergency Use Authorization (EUA). This EUA will remain in effect (meaning this test can be used) for the duration of the COVID-19 declaration under Section 564(b)(1) of the Act, 21 U.S.C. section 360bbb-3(b)(1), unless the authorization is terminated or revoked.  Performed at [facility], [place] [address] [place], [place] [address]   Blood Culture (routine x 2)     Status: None   Collection Time: 02/26/21  2:59 AM   Specimen: BLOOD RIGHT FOREARM  Result Value Ref Range Status   Specimen Description BLOOD RIGHT FOREARM  Final   Special Requests   Final    BOTTLES DRAWN AEROBIC AND ANAEROBIC Blood Culture adequate volume  Culture   Final    NO GROWTH 5 DAYS Performed at [facility], [place] [address], [place], [place] [address]    Report Status 03/03/2021 FINAL  Final  MRSA Next Gen by PCR, Nasal     Status: None   Collection Time: 02/26/21  6:04 AM   Specimen: Nasal Mucosa; Nasal Swab  Result Value Ref Range Status   MRSA by PCR Next Gen NOT DETECTED NOT DETECTED Final    Comment: (NOTE) The GeneXpert MRSA Assay (FDA approved for NASAL specimens only), is one component of a comprehensive MRSA colonization surveillance program. It is not intended to diagnose MRSA infection nor to guide or monitor treatment for MRSA infections. Test performance is not FDA approved in patients less than 73 years old. Performed at [facility], [place] [address], [place], [place] [address]   Blood Culture (routine x 2)     Status: None   Collection Time: 02/26/21 12:15 PM   Specimen: BLOOD RIGHT HAND  Result Value Ref Range Status   Specimen Description BLOOD RIGHT HAND  Final   Special Requests   Final    BOTTLES DRAWN AEROBIC AND ANAEROBIC Blood Culture adequate volume   Culture   Final    NO GROWTH 5 DAYS Performed at [facility], [place] [address], [place], [place] [address]    Report Status 03/03/2021 FINAL  Final  Aerobic/Anaerobic Culture w Gram Stain (surgical/deep wound)     Status: None   Collection Time: 02/26/21  4:00 PM   Specimen: Pleural Fluid  Result Value Ref Range Status   Specimen Description FLUID PLEURAL RIGHT  Final   Special Requests NONE  Final   Gram Stain   Final    RARE WBC PRESENT,BOTH PMN AND MONONUCLEAR NO ORGANISMS SEEN    Culture   Final    No growth aerobically or anaerobically. Performed at [facility], [place] [address] [place], [place] [address]    Report Status 03/04/2021 FINAL  Final  Surgical pcr screen     Status: None   Collection Time: 02/28/21  7:59 AM   Specimen: Nasal Mucosa; Nasal Swab  Result Value Ref Range Status   MRSA, PCR NEGATIVE NEGATIVE Final   Staphylococcus aureus NEGATIVE NEGATIVE Final    Comment: (NOTE) The Xpert SA Assay (FDA approved for NASAL specimens in patients 72 years of age and older), is one component of a comprehensive surveillance program. It is not intended to diagnose infection nor to guide or monitor treatment. Performed at [facility], [place] [address], [place],  [address]          Radiology Studies: DG CHEST PORT 1 VIEW  Result Date: 03/04/2021 CLINICAL DATA:  Follow-up exam. Edema. Status post chest tube. Status post right video-assisted lobectomy. EXAM: PORTABLE CHEST 1 VIEW COMPARISON:  03/03/2021 and older exams. FINDINGS: Since the earlier study, the small caliber right chest tube has been removed. No pneumothorax. Postsurgical changes in the right upper lung/hemithorax are stable. Lungs demonstrate bilateral interstitial opacities similar to the previous day's exam. No new lung abnormalities. No convincing pleural effusion. IMPRESSION: 1. Status post removal of the right-sided chest tube. 2. No pneumothorax. 3. No other change from the previous day's study. Electronically Signed   By: Lajean Manes M.D.   On: 03/04/2021  09:22        Scheduled Meds:  amLODipine  10 mg Oral Daily   calcium carbonate  1 tablet Oral  TID   gabapentin  400 mg Oral TID   ipratropium-albuterol  3 mL Nebulization TID   irbesartan  300 mg Oral Daily   latanoprost  1 drop Both Eyes QHS   methylPREDNISolone (SOLU-MEDROL) injection  40 mg Intravenous Q12H   QUEtiapine  50 mg Oral QHS   sertraline  100 mg Oral Daily   Continuous Infusions:  ceFEPime (MAXIPIME) IV 2 g (03/05/21 0422)   promethazine (PHENERGAN) injection (IM or IVPB)       LOS: 7 days    Time spent: 35 min    Nicolette Bang, MD [facility]  If 7PM-7AM, please contact night-coverage  03/05/2021, 9:49 AM

## 2021-03-06 ENCOUNTER — Inpatient Hospital Stay (HOSPITAL_COMMUNITY): Payer: 59

## 2021-03-06 LAB — BASIC METABOLIC PANEL
Anion gap: 10 (ref 5–15)
Anion gap: 7 (ref 5–15)
BUN: 17 mg/dL (ref 6–20)
BUN: 17 mg/dL (ref 6–20)
CO2: 23 mmol/L (ref 22–32)
CO2: 27 mmol/L (ref 22–32)
Calcium: 8.9 mg/dL (ref 8.9–10.3)
Chloride: 100 mmol/L (ref 98–111)
Chloride: 101 mmol/L (ref 98–111)
Creatinine, Ser: 0.57 mg/dL — ABNORMAL LOW (ref 0.61–1.24)
Creatinine, Ser: 0.56 mg/dL — ABNORMAL LOW (ref 0.61–1.24)
GFR, Estimated: >60 mL/min (ref >60)
GFR, Estimated: >60 mL/min (ref >60)
Glucose, Bld: 233 mg/dL — ABNORMAL HIGH (ref 70–99)
Glucose, Bld: 186 mg/dL — ABNORMAL HIGH (ref 70–99)
Potassium: 7 mmol/L (ref 3.5–5.1)
Potassium: 3.9 mmol/L (ref 3.5–5.1)
Sodium: 133 mmol/L — ABNORMAL LOW (ref 135–145)
Sodium: 135 mmol/L (ref 135–145)

## 2021-03-06 LAB — CBC WITH DIFFERENTIAL/PLATELET
Abs Immature Granulocytes: 0.15 10*3/uL — ABNORMAL HIGH (ref 0.00–0.07)
Basophils Absolute: 0 10*3/uL (ref 0.0–0.1)
Basophils Relative: 0 %
Eosinophils Absolute: 0 10*3/uL (ref 0.0–0.5)
Eosinophils Relative: 0 %
HCT: 32.4 % — ABNORMAL LOW (ref 39.0–52.0)
Hemoglobin: 10.7 g/dL — ABNORMAL LOW (ref 13.0–17.0)
Immature Granulocytes: 1 %
Lymphocytes Relative: 6 %
Lymphs Abs: 0.8 10*3/uL (ref 0.7–4.0)
MCH: 30.1 pg (ref 26.0–34.0)
MCHC: 33 g/dL (ref 30.0–36.0)
MCV: 91.3 fL (ref 80.0–100.0)
Monocytes Absolute: 1.1 10*3/uL — ABNORMAL HIGH (ref 0.1–1.0)
Monocytes Relative: 8 %
Neutro Abs: 12.5 10*3/uL — ABNORMAL HIGH (ref 1.7–7.7)
Neutrophils Relative %: 85 %
Platelets: 569 10*3/uL — ABNORMAL HIGH (ref 150–400)
RBC: 3.55 MIL/uL — ABNORMAL LOW (ref 4.22–5.81)
RDW: 14.3 % (ref 11.5–15.5)
WBC: 14.6 10*3/uL — ABNORMAL HIGH (ref 4.0–10.5)
nRBC: 0 % (ref 0.0–0.2)

## 2021-03-06 IMAGING — DX DG CHEST 1V PORT
1 series · 1 of 1 positions shown · non-contrast
Comparison: [DATE].  [DATE].  [DATE].  CT [DATE].

CLINICAL DATA: Pneumothorax.  Hypoxia.

EXAM:
PORTABLE CHEST 1 VIEW

[chest ap]
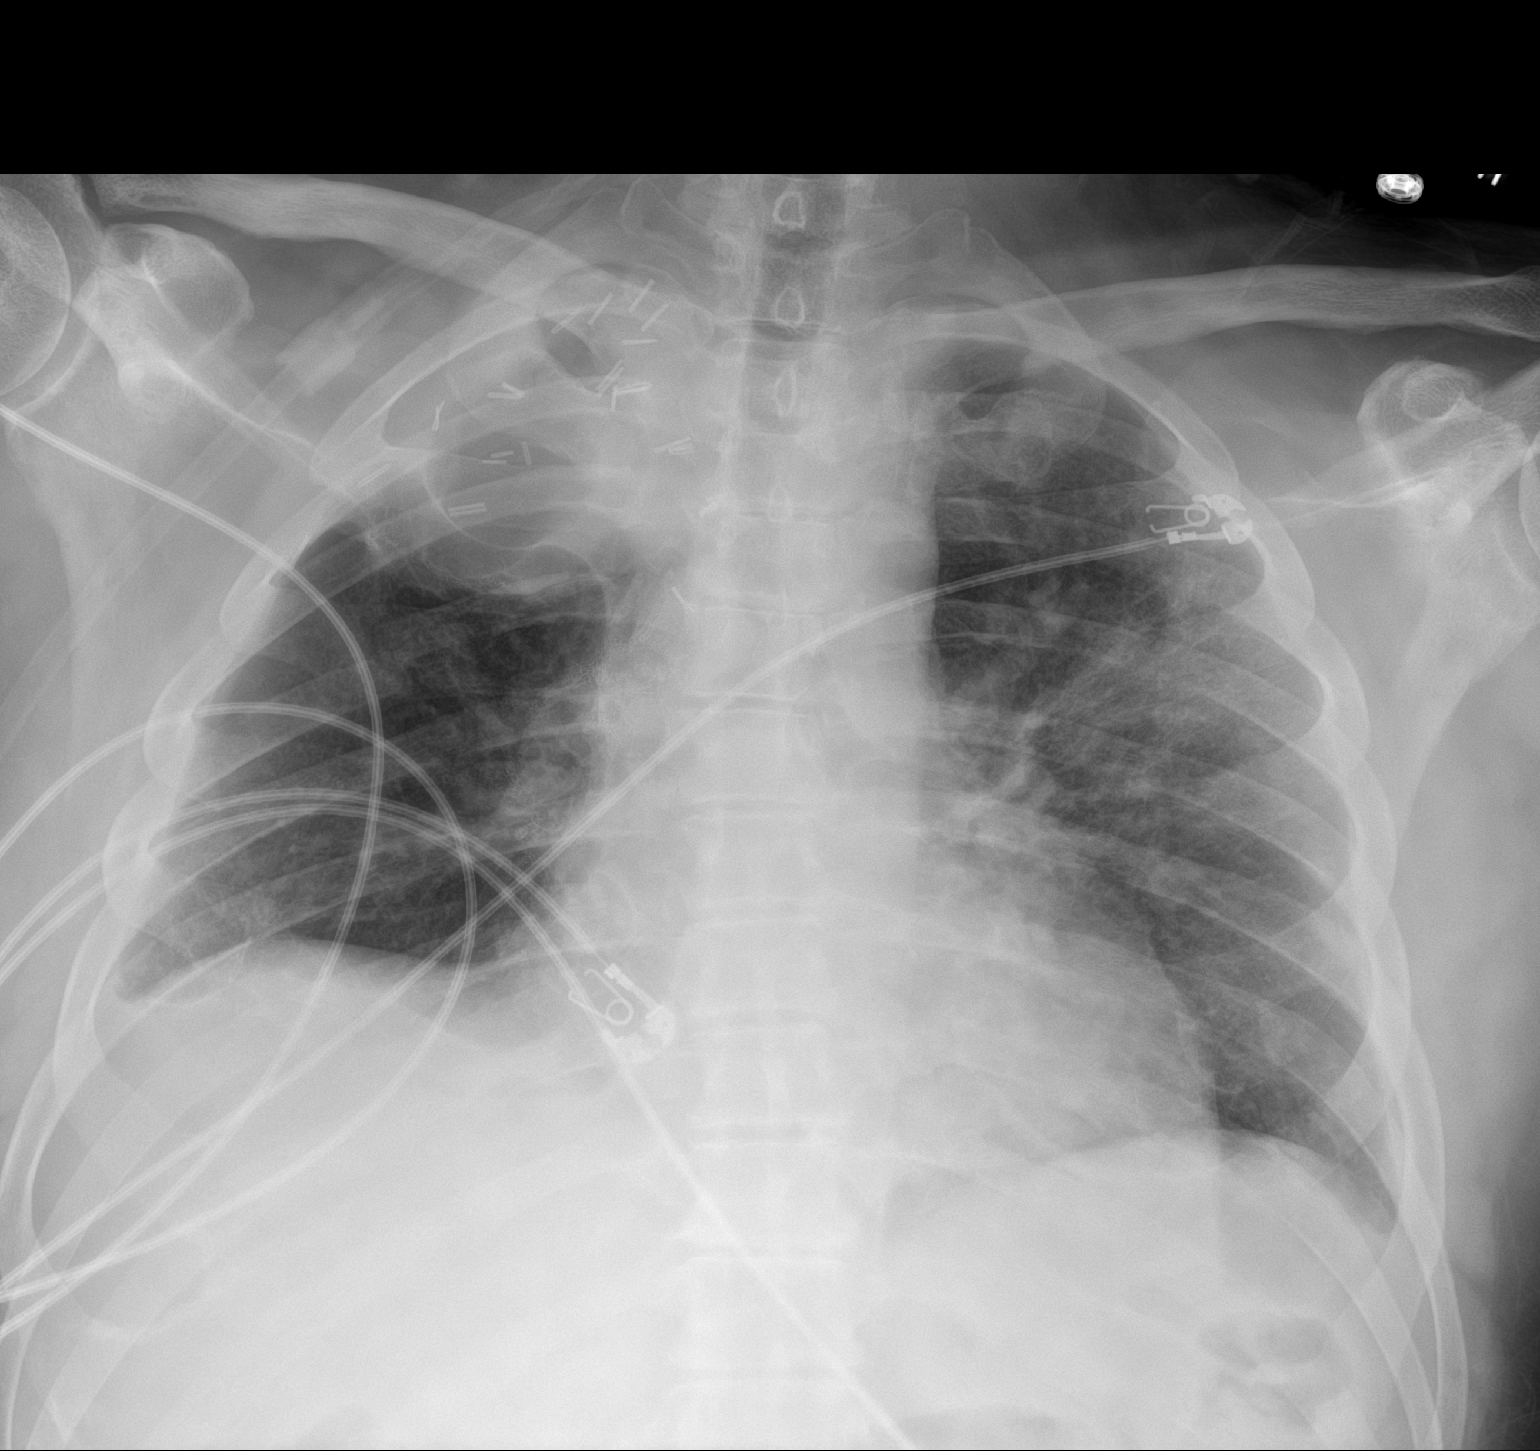

[1 of 1 positions shown; findings below may reference images not displayed]

FINDINGS: Postsurgical changes in the right apex again noted. Again patient
status post removal of right apical chest tube. Slight
reaccumulation of right apical pleural fluid cannot be excluded.
Mild left upper lung infiltrate. No pneumothorax. Low lung volumes.
Tiny bilateral basilar pleural effusions cannot be excluded. Stable
cardiomegaly.
IMPRESSION: 1. Postsurgical changes right apex again noted. Again patient status
post removal of right apical chest tube. Slight reaccumulation of
right apical pleural fluid cannot be excluded. No pneumothorax.

2. Mild left upper lung infiltrate. Low lung volumes. Tiny bilateral
basilar pleural effusions cannot be excluded.

## 2021-03-06 MED ORDER — POTASSIUM CHLORIDE CRYS ER 20 MEQ PO TBCR
20.0000 meq | EXTENDED_RELEASE_TABLET | Freq: Once | ORAL | Status: AC
  Administered 2021-03-06: 20 meq via ORAL
  Filled 2021-03-06: qty 1

## 2021-03-06 MED ORDER — LIDOCAINE 5 % EX PTCH
1.0000 | MEDICATED_PATCH | CUTANEOUS | Status: DC
  Administered 2021-03-06 – 2021-03-07 (×2): 1 via TRANSDERMAL
  Filled 2021-03-06 (×2): qty 1

## 2021-03-06 MED ORDER — FUROSEMIDE 10 MG/ML IJ SOLN
40.0000 mg | Freq: Once | INTRAMUSCULAR | Status: AC
  Administered 2021-03-06: 40 mg via INTRAVENOUS
  Filled 2021-03-06: qty 4

## 2021-03-06 MED ORDER — PANTOPRAZOLE SODIUM 40 MG PO TBEC
40.0000 mg | DELAYED_RELEASE_TABLET | Freq: Every day | ORAL | Status: DC
  Administered 2021-03-06 – 2021-03-07 (×2): 40 mg via ORAL
  Filled 2021-03-06 (×2): qty 1

## 2021-03-06 MED ORDER — IPRATROPIUM-ALBUTEROL 0.5-2.5 (3) MG/3ML IN SOLN: 3.0000 mL | Freq: Four times a day (QID) | RESPIRATORY_TRACT | Status: DC | PRN

## 2021-03-06 NOTE — Progress Notes (Signed)
SATURATION QUALIFICATIONS: (This note is used to comply with regulatory documentation for home oxygen)  Patient Saturations on Room Air at Rest = 82%  Patient Saturations on Room Air while Ambulating = 83%  Patient Saturations on 4 Liters of oxygen while Ambulating = 92%  Please briefly explain why patient needs home oxygen:pulse ox drops to 83% on room  air  on ambulation. Needs oxygen on discharge.

## 2021-03-06 NOTE — Progress Notes (Signed)
[facility] night shift.  The nursing staff notified me that the patient's potassium level was 7.0 mmol/L.  This did not make sense so I asked the staff to get a second measurement.  The potassium level on the second BMP was 3.9 mmol/L.  Tennis Must, MD.

## 2021-03-06 NOTE — Progress Notes (Signed)
PROGRESS NOTE    Christopher Carter  IHK:[id number] DOB: 01-28-1961 DOA: 02/25/2021 PCP: [name], MD   Brief Narrative: 60 year old with past medical history significant for hypertension, stage III adenocarcinoma of lung, depression, recent admission 5/16--5/20 during which he underwent right upper lobe lobectomy and chest wall dissection.by Dr. Roxan Hockey, presented with severe cough, bright red hemoptysis and shortness of breath.  CT chest revealed diffuse infiltrate concerning for alveolar hemorrhage with possible superimposed infectious process/pulmonary edema.  Patient was started on broad-spectrum, he completed 7 days IV antibiotics during this hospitalization.  CCM and CVTS consult patient on right side chest tube placement for hemothorax.    Assessment & Plan:   Principal Problem:   Acute respiratory failure with hypoxia (HCC) Active Problems:   Essential hypertension   Pulmonary alveolar hemorrhage   Adenocarcinoma of right lung (HCC)   Pneumonia of right lung due to infectious organism   Sepsis due to pneumonia ([place])   Major depressive disorder  1-Acute hypoxic respiratory failure, right Apical loculated pleural effusion Hemothorax concern for alveolar hemorrhage Recent right upper lobectomy for stage III adenocarcinoma Severe sepsis secondary to pneumonia.  -Patient presented with hemoptysis, cough, shortness of breath, requiring supplemental oxygen. -SIRS criteria, source of infection pneumonia. -Patient required HF oxygen up 25 L-- down to 4 L oxygen.  -He will likely need oxygen at discharge.  Chest tube remove 6/25. Completed IV antibiotics 7 days.  He will nee prednisone taper over 2 weeks.  Repeated chest x ray: unable to rule out increase haziness apical pleural fluid.  Discussed x ray with Dr Silas Flood, plan for IV lasix today.   2-Pain control, anxiety: On IV Toradol plan for 2 days. On oral Dilaudid. Plan to start lidocaine patch  3-Lactic  acidosis: Resolved  4-Accelerated hypertension: Continue with Avapro, amlodipine  History of depression: Continue with Zoloft,   Adenocarcinoma of the right lung Diagnosed April 2022, follows with Dr. Earlie Server.  Status post recent VATS with right upper lobe lobectomy and chest wall dissection by Dr. Koleen Nimrod  Leukocytosis; related to steroids.    Estimated body mass index is 26.54 kg/m as calculated from the following:   Height as of this encounter: 5\' 10"  (1.778 m).   Weight as of this encounter: 83.9 kg.   DVT prophylaxis: SCD Code Status: Full code Family Communication: Care discussed with patient Disposition Plan:  Status is: Inpatient  Remains inpatient appropriate because:IV treatments appropriate due to intensity of illness or inability to take PO  Dispo: The patient is from: Home              Anticipated d/c is to: Home              Patient currently is not medically stable to d/c.   Difficult to place patient No        Consultants:  CVTS Pulmonology   Procedures:    Antimicrobials:    Subjective: He report SOB on exertion. Still SOB at rest but better than before.  He is having pain with deep breath and at site of prior chest tube site.    Objective: Vitals:   03/05/21 1959 03/05/21 2023 03/06/21 0300 03/06/21 0719  BP: (!) 153/66  (!) 144/80   Pulse: 84  68   Resp: 17  20   Temp: 98.4 F (36.9 C)  98 F (36.7 C)   TempSrc: Oral  Oral   SpO2: 90% 98% 100% 93%  Weight:      Height:  Intake/Output Summary (Last 24 hours) at 03/06/2021 0732 Last data filed at 03/06/2021 0352 Gross per 24 hour  Intake 595.89 ml  Output 750 ml  Net -154.11 ml   Filed Weights   02/26/21 0208  Weight: 83.9 kg    Examination:  General exam: Appears calm and comfortable  Respiratory system: BL crackles.  Cardiovascular system: S1 & S2 heard, RRR.  Gastrointestinal system: Abdomen is nondistended, soft and nontender. No organomegaly or masses  felt. Normal bowel sounds heard. Central nervous system: Alert and oriented. No focal neurological deficits. Extremities: Symmetric 5 x 5 power.   Data Reviewed: I have personally reviewed following labs and imaging studies  CBC: Recent Labs  Lab 03/01/21 0023 03/02/21 1254 03/03/21 0024 03/04/21 0052 03/05/21 0013 03/06/21 0108  WBC 16.7* 17.1* 13.4* 19.0* 15.8* 14.6*  NEUTROABS 13.0* 13.7*  --  16.8* 13.4* 12.5*  HGB 11.7* 12.0* 10.9* 10.2* 10.1* 10.7*  HCT 34.8* 35.2* 32.0* 30.4* 30.2* 32.4*  MCV 91.3 89.8 89.4 90.5 91.2 91.3  PLT 332 366 361 426* 480* 188*   Basic Metabolic Panel: Recent Labs  Lab 02/28/21 0021 03/01/21 0023 03/02/21 1254 03/03/21 0024 03/04/21 0052 03/05/21 0013 03/06/21 0108 03/06/21 0359  NA 134*   < > 132* 137 137 137 133* 135  K 3.4*   < > 3.0* 4.1 3.6 4.1 7.0* 3.9  CL 100   < > 97* 101 104 103 100 101  CO2 23   < > 25 27 26 27 23 27   GLUCOSE 116*   < > 177* 191* 189* 222* 233* 186*  BUN 8   < > 10 18 22* 19 17 17   CREATININE 0.56*   < > 0.52* 0.61 0.61 0.60* 0.57* 0.56*  CALCIUM 8.6*   < > 8.5* 9.2 9.0 8.8* Null_Result 8.9  MG 1.9  --  2.0 2.3  --   --   --   --   PHOS 2.8  --   --   --   --   --   --   --    < > = values in this interval not displayed.   GFR: Estimated Creatinine Clearance: 103.9 mL/min (A) (by C-G formula based on SCr of 0.56 mg/dL (L)). Liver Function Tests: No results for input(s): AST, ALT, ALKPHOS, BILITOT, PROT, ALBUMIN in the last 168 hours. No results for input(s): LIPASE, AMYLASE in the last 168 hours. No results for input(s): AMMONIA in the last 168 hours. Coagulation Profile: No results for input(s): INR, PROTIME in the last 168 hours. Cardiac Enzymes: No results for input(s): CKTOTAL, CKMB, CKMBINDEX, TROPONINI in the last 168 hours. BNP (last 3 results) No results for input(s): PROBNP in the last 8760 hours. HbA1C: No results for input(s): HGBA1C in the last 72 hours. CBG: No results for input(s):  GLUCAP in the last 168 hours. Lipid Profile: No results for input(s): CHOL, HDL, LDLCALC, TRIG, CHOLHDL, LDLDIRECT in the last 72 hours. Thyroid Function Tests: No results for input(s): TSH, T4TOTAL, FREET4, T3FREE, THYROIDAB in the last 72 hours. Anemia Panel: No results for input(s): VITAMINB12, FOLATE, FERRITIN, TIBC, IRON, RETICCTPCT in the last 72 hours. Sepsis Labs: No results for input(s): PROCALCITON, LATICACIDVEN in the last 168 hours.  Recent Results (from the past 240 hour(s))  Resp Panel by RT-PCR (Flu A&B, Covid) Nasopharyngeal Swab     Status: None   Collection Time: 02/26/21  2:13 AM   Specimen: Nasopharyngeal Swab; Nasopharyngeal(NP) swabs in vial transport medium  Result Value Ref  Range Status   SARS Coronavirus 2 by RT PCR NEGATIVE NEGATIVE Final    Comment: (NOTE) SARS-CoV-2 target nucleic acids are NOT DETECTED.  The SARS-CoV-2 RNA is generally detectable in upper respiratory specimens during the acute phase of infection. The lowest concentration of SARS-CoV-2 viral copies this assay can detect is 138 copies/mL. A negative result does not preclude SARS-Cov-2 infection and should not be used as the sole basis for treatment or other patient management decisions. A negative result may occur with  improper specimen collection/handling, submission of specimen other than nasopharyngeal swab, presence of viral mutation(s) within the areas targeted by this assay, and inadequate number of viral copies(<138 copies/mL). A negative result must be combined with clinical observations, patient history, and epidemiological information. The expected result is Negative.  Fact Sheet for Patients:  EntrepreneurPulse.com.au  Fact Sheet for Healthcare Providers:  IncredibleEmployment.be  This test is no t yet approved or cleared by the [place] FDA and  has been authorized for detection and/or diagnosis of SARS-CoV-2 by FDA under an  Emergency Use Authorization (EUA). This EUA will remain  in effect (meaning this test can be used) for the duration of the COVID-19 declaration under Section 564(b)(1) of the Act, 21 U.S.C.section 360bbb-3(b)(1), unless the authorization is terminated  or revoked sooner.       Influenza A by PCR NEGATIVE NEGATIVE Final   Influenza B by PCR NEGATIVE NEGATIVE Final    Comment: (NOTE) The Xpert Xpress SARS-CoV-2/FLU/RSV plus assay is intended as an aid in the diagnosis of influenza from Nasopharyngeal swab specimens and should not be used as a sole basis for treatment. Nasal washings and aspirates are unacceptable for Xpert Xpress SARS-CoV-2/FLU/RSV testing.  Fact Sheet for Patients: EntrepreneurPulse.com.au  Fact Sheet for Healthcare Providers: IncredibleEmployment.be  This test is not yet approved or cleared by the [place] FDA and has been authorized for detection and/or diagnosis of SARS-CoV-2 by FDA under an Emergency Use Authorization (EUA). This EUA will remain in effect (meaning this test can be used) for the duration of the COVID-19 declaration under Section 564(b)(1) of the Act, 21 U.S.C. section 360bbb-3(b)(1), unless the authorization is terminated or revoked.  Performed at [facility], [place] [address] [place], [place] [address]   Blood Culture (routine x 2)     Status: None   Collection Time: 02/26/21  2:59 AM   Specimen: BLOOD RIGHT FOREARM  Result Value Ref Range Status   Specimen Description BLOOD RIGHT FOREARM  Final   Special Requests   Final    BOTTLES DRAWN AEROBIC AND ANAEROBIC Blood Culture adequate volume   Culture   Final    NO GROWTH 5 DAYS Performed at [facility], [place] [address] [place], [place] [address]    Report Status 03/03/2021 FINAL  Final  MRSA Next Gen by PCR, Nasal     Status: None   Collection Time: 02/26/21  6:04 AM   Specimen: Nasal Mucosa; Nasal Swab  Result Value Ref Range  Status   MRSA by PCR Next Gen NOT DETECTED NOT DETECTED Final    Comment: (NOTE) The GeneXpert MRSA Assay (FDA approved for NASAL specimens only), is one component of a comprehensive MRSA colonization surveillance program. It is not intended to diagnose MRSA infection nor to guide or monitor treatment for MRSA infections. Test performance is not FDA approved in patients less than 74 years old. Performed at [facility], [place] [address] [place], [place] [address]   Blood  Culture (routine x 2)     Status: None   Collection Time: 02/26/21 12:15 PM   Specimen: BLOOD RIGHT HAND  Result Value Ref Range Status   Specimen Description BLOOD RIGHT HAND  Final   Special Requests   Final    BOTTLES DRAWN AEROBIC AND ANAEROBIC Blood Culture adequate volume   Culture   Final    NO GROWTH 5 DAYS Performed at [facility], [address] [place], [place] [address]    Report Status 03/03/2021 FINAL  Final  Aerobic/Anaerobic Culture w Gram Stain (surgical/deep wound)     Status: None   Collection Time: 02/26/21  4:00 PM   Specimen: Pleural Fluid  Result Value Ref Range Status   Specimen Description FLUID PLEURAL RIGHT  Final   Special Requests NONE  Final   Gram Stain   Final    RARE WBC PRESENT,BOTH PMN AND MONONUCLEAR NO ORGANISMS SEEN    Culture   Final    No growth aerobically or anaerobically. Performed at [facility], [place] [address], [place], [place] [address]    Report Status 03/04/2021 FINAL  Final  Surgical pcr screen     Status: None   Collection Time: 02/28/21  7:59 AM   Specimen: Nasal Mucosa; Nasal Swab  Result Value Ref Range Status   MRSA, PCR NEGATIVE NEGATIVE Final   Staphylococcus aureus NEGATIVE NEGATIVE Final    Comment: (NOTE) The Xpert SA Assay (FDA approved for NASAL specimens in patients 29 years of age and older), is one component of a comprehensive surveillance program. It is not intended to diagnose infection nor to guide or monitor  treatment. Performed at [facility], [place] [address], [place], [place] [address]          Radiology Studies: No results found.      Scheduled Meds:  amLODipine  10 mg Oral Daily   calcium carbonate  1 tablet Oral TID   gabapentin  400 mg Oral TID   irbesartan  300 mg Oral Daily   latanoprost  1 drop Both Eyes QHS   predniSONE  40 mg Oral Q breakfast   QUEtiapine  50 mg Oral QHS   sertraline  100 mg Oral Daily   Continuous Infusions:  promethazine (PHENERGAN) injection (IM or IVPB)       LOS: 8 days    Time spent: 35 minutes    Shaylin Blatt A Diamante Rubin, MD [facility]   If 7PM-7AM, please contact night-coverage [email or web address]  03/06/2021, 7:32 AM

## 2021-03-06 NOTE — Progress Notes (Signed)
Date and time results received: 03/06/21 0310 (use smartphrase ".now" to insert current time)  Test:Potassium Critical Value:7.0  Name of Provider Notified: Dr Olevia Bowens  Orders Received? Or Actions Taken?: Pending

## 2021-03-07 LAB — BASIC METABOLIC PANEL
Anion gap: 7 (ref 5–15)
BUN: 18 mg/dL (ref 6–20)
CO2: 28 mmol/L (ref 22–32)
Calcium: 8.7 mg/dL — ABNORMAL LOW (ref 8.9–10.3)
Chloride: 102 mmol/L (ref 98–111)
Creatinine, Ser: 0.6 mg/dL — ABNORMAL LOW (ref 0.61–1.24)
GFR, Estimated: >60 mL/min (ref >60)
Glucose, Bld: 144 mg/dL — ABNORMAL HIGH (ref 70–99)
Potassium: 3.6 mmol/L (ref 3.5–5.1)
Sodium: 137 mmol/L (ref 135–145)

## 2021-03-07 MED ORDER — POTASSIUM CHLORIDE CRYS ER 20 MEQ PO TBCR
20.0000 meq | EXTENDED_RELEASE_TABLET | Freq: Every day | ORAL | Status: DC
  Administered 2021-03-07: 20 meq via ORAL
  Filled 2021-03-07: qty 1

## 2021-03-07 MED ORDER — POTASSIUM CHLORIDE CRYS ER 20 MEQ PO TBCR: 20.0000 meq | EXTENDED_RELEASE_TABLET | Freq: Every day | ORAL | 0 refills | Status: DC

## 2021-03-07 MED ORDER — FUROSEMIDE 20 MG PO TABS: 20.0000 mg | ORAL_TABLET | Freq: Every day | ORAL | 0 refills | Status: DC

## 2021-03-07 MED ORDER — LIDOCAINE 5 % EX PTCH: 1.0000 | MEDICATED_PATCH | CUTANEOUS | 0 refills | Status: DC

## 2021-03-07 MED ORDER — PREDNISONE 20 MG PO TABS: ORAL_TABLET | ORAL | 0 refills | Status: DC

## 2021-03-07 MED ORDER — POLYETHYLENE GLYCOL 3350 17 G PO PACK: 17.0000 g | PACK | Freq: Every day | ORAL | 0 refills | Status: AC | PRN

## 2021-03-07 MED ORDER — FUROSEMIDE 20 MG PO TABS
20.0000 mg | ORAL_TABLET | Freq: Every day | ORAL | Status: DC
  Administered 2021-03-07: 20 mg via ORAL
  Filled 2021-03-07: qty 1

## 2021-03-07 MED ORDER — GABAPENTIN 400 MG PO CAPS: 400.0000 mg | ORAL_CAPSULE | Freq: Three times a day (TID) | ORAL | 1 refills | Status: DC

## 2021-03-07 MED ORDER — PANTOPRAZOLE SODIUM 40 MG PO TBEC: 40.0000 mg | DELAYED_RELEASE_TABLET | Freq: Every day | ORAL | 0 refills | Status: DC

## 2021-03-07 MED ORDER — HYDROMORPHONE HCL 2 MG PO TABS: 2.0000 mg | ORAL_TABLET | ORAL | 0 refills | Status: AC | PRN

## 2021-03-07 NOTE — TOC Transition Note (Signed)
Transition of Care [facility] - CM/SW Discharge Note   Patient Details  Name: Christopher Carter MRN: [id number] Date of Birth: [birth year]  Transition of [facility]) CM/SW Contact:  Zenon Mayo, RN Phone Number: 03/07/2021, 12:40 PM   Clinical Narrative:    Patient is for dc today, NCM  spoke with patient , he is ok with [employer] Supplying the oxygen for him.  NCM made referral to [facility] with [employer] at 11:02 for home oxygen.    Final next level of care: Home/Self Care Barriers to Discharge: No Barriers Identified   Patient Goals and CMS Choice Patient states their goals for this hospitalization and ongoing recovery are:: return home   Choice offered to / list presented to : NA  Discharge Placement                       Discharge Plan and Services                DME Arranged: Oxygen DME Agency: [employer] Date DME Agency Contacted: 03/07/21 Time DME Agency Contacted: 20 Representative spoke with at DME Agency: [place]: NA          Social Determinants of Health ([place]) Interventions     Readmission Risk Interventions No flowsheet data found.

## 2021-03-07 NOTE — Discharge Summary (Signed)
Physician Discharge Summary  JAHLEEL STROSCHEIN RSW:[id number] DOB: Jan 10, 1961 DOA: 02/25/2021  PCP: [name], MD  Admit date: 02/25/2021 Discharge date: 03/07/2021  Admitted From: Home  Disposition:  Home   Recommendations for Outpatient Follow-up:  Follow up with PCP in 1-2 weeks Please obtain BMP/CBC in one week Needs follow up on electrolytes.  He will need repeat Chest  x ray.  Needs to follow up with CVTS and Pulmonology.   Home Health: None. Home oxygen.   Discharge Condition: Stable.  CODE STATUS: Full Code  Diet recommendation: Heart Healthy   Brief/Interim Summary: 60 year old with past medical history significant for hypertension, stage III adenocarcinoma of lung, depression, recent admission 5/16--5/20 during which he underwent right upper lobe lobectomy and chest wall dissection.by Dr. Roxan Hockey, presented with severe cough, bright red hemoptysis and shortness of breath.   CT chest revealed diffuse infiltrate concerning for alveolar hemorrhage with possible superimposed infectious process/pulmonary edema.  Patient was started on broad-spectrum, he completed 7 days IV antibiotics during this hospitalization.  CCM and CVTS consult patient on right side chest tube placement for hemothorax.     1-Acute hypoxic respiratory failure, right Apical loculated pleural effusion Hemothorax concern for alveolar hemorrhage Recent right upper lobectomy for stage III adenocarcinoma Severe sepsis secondary to pneumonia. -Patient presented with hemoptysis, cough, shortness of breath, requiring supplemental oxygen. -SIRS criteria, source of infection pneumonia. -Patient required HF oxygen up 25 L-- down to 4 L oxygen.3L  -He will likely need oxygen at discharge. Chest tube remove 6/25. Completed IV antibiotics 7 days. He will nee prednisone taper over 2 weeks. Repeated chest x ray: unable to rule out increase haziness apical pleural fluid. Discussed x ray with Dr  Silas Flood, Received IV lasix 6/28. Plan to discharge on oral lasix for 3 days, and potasium supplementation. His oxygen requirement has decrease to 3 L. He will be also discharge on Prednisone taper dose over 23 weeks.  He will need close follow up with Dr Roxan Hockey and Pulmonology.   2-Pain control, anxiety: On IV Toradol plan for 2 days. On oral Dilaudid. Started lidocaine patch  pain better controlled.   3-Lactic acidosis: Resolved   4-Accelerated hypertension: Continue with Avapro, amlodipine   History of depression: Continue with Zoloft,    Adenocarcinoma of the right lung Diagnosed April 2022, follows with Dr. Earlie Server.  Status post recent VATS with right upper lobe lobectomy and chest wall dissection by Dr. Koleen Nimrod   Leukocytosis; related to steroids.     Discharge Diagnoses:  Principal Problem:   Acute respiratory failure with hypoxia ([place]) Active Problems:   Essential hypertension   Pulmonary alveolar hemorrhage   Adenocarcinoma of right lung (HCC)   Pneumonia of right lung due to infectious organism   Sepsis due to pneumonia [facility])   Major depressive disorder    Discharge Instructions  Discharge Instructions     Diet - low sodium heart healthy   Complete by: As directed    Discharge wound care:   Complete by: As directed    See above   Increase activity slowly   Complete by: As directed       Allergies as of 03/07/2021       Reactions   Oxycodone Hcl Other (See Comments)   Pt reported "seeing things" and " feeling unusual."   Bupropion Nausea And Vomiting        Medication List     STOP taking these medications    aspirin EC 81 MG tablet  BIOFREEZE EX   CoQ10 100 MG Caps   HAWTHORNE BERRY PO   OVER THE COUNTER MEDICATION   RED YEAST RICE PO   traMADol 50 MG tablet Commonly known as: ULTRAM   TURMERIC PO       TAKE these medications    acetaminophen 500 MG tablet Commonly known as: TYLENOL Take 500 mg by mouth 2 (two)  times daily as needed for moderate pain or headache.   furosemide 20 MG tablet Commonly known as: LASIX Take 1 tablet (20 mg total) by mouth daily.   gabapentin 400 MG capsule Commonly known as: NEURONTIN Take 1 capsule (400 mg total) by mouth 3 (three) times daily. What changed:  medication strength how much to take   HYDROmorphone 2 MG tablet Commonly known as: DILAUDID Take 1-2 tablets (2-4 mg total) by mouth every 4 (four) hours as needed for up to 5 days for moderate pain or severe pain.   irbesartan 300 MG tablet Commonly known as: AVAPRO Take 300 mg by mouth daily.   latanoprost 0.005 % ophthalmic solution Commonly known as: XALATAN Place 1 drop into both eyes at bedtime.   lidocaine 5 % Commonly known as: LIDODERM Place 1 patch onto the skin daily. Remove & Discard patch within 12 hours or as directed by MD   pantoprazole 40 MG tablet Commonly known as: PROTONIX Take 1 tablet (40 mg total) by mouth daily. Start taking on: March 08, 2021   polyethylene glycol 17 g packet Commonly known as: MIRALAX / GLYCOLAX Take 17 g by mouth daily as needed for mild constipation.   potassium chloride SA 20 MEQ tablet Commonly known as: KLOR-CON Take 1 tablet (20 mEq total) by mouth daily.   predniSONE 20 MG tablet Commonly known as: DELTASONE Take 40 mg daily for 5 days, then 20 mg daily for 5 days then 10 mg daily for 5 days then stop.   QUEtiapine 50 MG tablet Commonly known as: SEROQUEL Take 50 mg by mouth at bedtime.   sertraline 100 MG tablet Commonly known as: ZOLOFT Take 100 mg by mouth daily.   vitamin B-12 1000 MCG tablet Commonly known as: CYANOCOBALAMIN Take 1,000 mcg by mouth daily.   vitamin C 1000 MG tablet Take 1,000 mg by mouth daily.       ASK your doctor about these medications    amLODipine 10 MG tablet Commonly known as: [place] 1 TABLET BY MOUTH EVERY DAY               Durable Medical Equipment  (From admission, onward)            Start     Ordered   03/06/21 1623  For home use only DME oxygen  Once       Question Answer Comment  Length of Need 6 Months   Mode or (Route) Nasal cannula   Liters per Minute 4   Frequency Continuous (stationary and portable oxygen unit needed)   Oxygen delivery system Gas      03/06/21 1622              Discharge Care Instructions  (From admission, onward)           Start     Ordered   03/07/21 0000  Discharge wound care:       Comments: See above   03/07/21 1028            Follow-up Information     Martyn Ehrich, NP  Follow up on 03/23/2021.   Specialty: Pulmonary Disease Why: Appt at 2:30 PM for hospital follow up. Will review O2 needs at that time. Contact information: [address] [place] [address] [phone]         [name], MD. Schedule an appointment as soon as possible for a visit in 1 week(s).   Specialty: Family Medicine Contact information: [place] [address] [phone]         [name], MD Follow up.   Specialty: Cardiothoracic Surgery Why: the office will call you with appointment. Contact information: [address] [place] [address] [place] [facility] Follow up.   Why: home oxygen Contact information: [address] [place] [address] [phone]                Allergies  Allergen Reactions   Oxycodone Hcl Other (See Comments)    Pt reported "seeing things" and " feeling unusual."   Bupropion Nausea And Vomiting    Consultations: CVTS Pulmonologist    Procedures/Studies: DG Chest 2 View  Result Date: 02/25/2021 CLINICAL DATA:  History of right partial lobectomy with hemoptysis and increasing shortness of breath EXAM: CHEST - 2 VIEW COMPARISON:  02/20/2021 FINDINGS: Cardiac shadow is stable. Left lung remains clear. Right lung demonstrates right-sided pleural effusion  and masslike density superiorly with postsurgical changes. This represents pleural fluid loculated from prior resection and stable in appearance. Some increasing parenchymal density is noted throughout the aerated lung which may represent hemorrhage given the history of hemoptysis. No new mass lesion is seen. IMPRESSION: Stable masslike density in the right apex consisting of loculated fluid following recent surgery. Increasing interstitial opacity within the right lung which may represent some hemorrhage given the hemoptysis. Small basilar right sided effusion. Electronically Signed   By: Inez Catalina M.D.   On: 02/25/2021 22:27   DG Chest 2 View  Result Date: 02/20/2021 CLINICAL DATA:  Right upper lobe mass EXAM: CHEST - 2 VIEW COMPARISON:  02/13/2021 FINDINGS: Cardiac shadow is stable. Volume loss on the right is noted with small effusions stable from the prior exam. Postoperative changes are noted in the right apex with persistent soft tissue mass lesion stable from the prior exam. Left lung remains clear. Posterior fracture of the right fifth rib is noted stable from previous exam. IMPRESSION: Stable right upper lobe mass lesion.  Postsurgical changes are seen. Electronically Signed   By: Inez Catalina M.D.   On: 02/20/2021 14:32   DG Chest 2 View  Result Date: 02/13/2021 CLINICAL DATA:  Status post thoracotomy and VATS procedure EXAM: CHEST - 2 VIEW COMPARISON:  Jan 26, 2021 FINDINGS: There is a masslike area in the right upper lobe extending to the apex with surgical clips in this area. This masslike area measures 8.0 x 8.1 cm. There is a right pleural effusion with volume loss on the right. Left lung is clear. Heart size and pulmonary vascularity normal. No adenopathy no bone lesions. IMPRESSION: 8.1 x 8.0 cm masslike area right upper lobe toward the apex. Suspect postoperative loculated fluid hematoma in this area is most likely etiology given the dramatic change compared to the study from  approximately 2 weeks prior. There is a small right pleural effusion there is right base volume loss. Left lung is clear. Heart size normal. Electronically Signed   By: Lowella Grip III M.D.  On: 02/13/2021 14:31   CT CHEST WO CONTRAST  Result Date: 02/16/2021 CLINICAL DATA:  History of right upper lobe lung cancer status post right upper lobe lobectomy. Follow-up postoperative right apical fluid collection. EXAM: CT CHEST WITHOUT CONTRAST TECHNIQUE: Multidetector CT imaging of the chest was performed following the standard protocol without IV contrast. COMPARISON:  PET-CT 12/13/2020 and chest x-ray 02/13/2021 FINDINGS: Cardiovascular: The heart is normal in size peer no pericardial effusion. Stable age advanced coronary artery calcifications. No definite aortic calcifications. Mediastinum/Nodes: Stable scattered sub 8 mm mediastinal lymph nodes. No new or progressive findings. The esophagus is grossly normal. Lungs/Pleura: Surgical changes from a right upper lobe lobectomy. There is a moderate-sized right pleural effusion with loculated fluid at the right lung apex. Small amount of residual pleural air is also noted. No worrisome pulmonary nodules to suggest pulmonary metastatic disease. Upper Abdomen: No significant upper abdominal findings. No hepatic or adrenal gland lesions are identified. Musculoskeletal: No significant bony findings. IMPRESSION: 1. Surgical changes from a right upper lobe lobectomy. 2. Moderate-sized right pleural effusion with loculated fluid at the right lung apex. 3. No worrisome pulmonary nodules to suggest pulmonary metastatic disease. 4. Stable age advanced coronary artery calcifications. Aortic Atherosclerosis (ICD10-I70.0) Electronically Signed   By: Marijo Sanes M.D.   On: 02/16/2021 16:27   CT Angio Chest PE W and/or Wo Contrast  Result Date: 02/26/2021 CLINICAL DATA:  hx of R partial lobectomy, has been coughing up blood since about noon, increased shortness of  breath, denies home 02, now on 3L [place]. Pulmonary embolus suspected. VATS and thoracotomy surgery 01/22/2021 EXAM: CT ANGIOGRAPHY CHEST WITH CONTRAST TECHNIQUE: Multidetector CT imaging of the chest was performed using the standard protocol during bolus administration of intravenous contrast. Multiplanar CT image reconstructions and MIPs were obtained to evaluate the vascular anatomy. CONTRAST:  17mL OMNIPAQUE IOHEXOL 350 MG/ML SOLN COMPARISON:  CT chest 02/16/2021 FINDINGS: Cardiovascular: Satisfactory opacification of the pulmonary arteries to the segmental level. No evidence of pulmonary embolism. Normal heart size. No pericardial effusion. The thoracic aorta is normal in caliber. Coronary artery calcifications are noted. Mediastinum/Nodes: No enlarged mediastinal, hilar, or axillary lymph nodes. Thyroid gland, trachea, and esophagus demonstrate no significant findings. Lungs/Pleura: Surgical changes related to a partial right lobectomy of the right upper lobe with associated loculated right apical moderate-sized pleural effusion. Persistent trace basilar pleural effusion. Diffuse, right greater than left as well as lower lobe greater than upper lobe, peribronchovascular ground-glass and consolidative opacities. Some peripheral airspace opacities are also noted. Upper Abdomen: No acute abnormality. Musculoskeletal: No chest wall abnormality. No suspicious lytic or blastic osseous lesions. No acute displaced fracture. Redemonstration of subacute right comminuted fifth as well as right nondisplaced sixth rib fractures that are broken in 2 separate places. Multilevel degenerative changes of the spine. Review of the MIP images confirms the above findings. IMPRESSION: 1. No pulmonary embolus. 2. Diffuse pulmonary findings findings suggestive of alveolar hemorrhage versus infection/inflammation versus pulmonary edema. 3. Similar-appearing loculated moderate sized right apical pleural effusion with limited evaluation due  to timing of intravenous contrast. Persistent trace right basilar pleural effusion. 4. Subacute right comminuted fifth as well as right nondisplaced sixth rib fractures that are broken in 2 separate places consistent with history of thoracotomy. 5. Coronary artery calcifications. Electronically Signed   By: Iven Finn M.D.   On: 02/26/2021 04:59   DG CHEST PORT 1 VIEW  Result Date: 03/06/2021 CLINICAL DATA:  Pneumothorax.  Hypoxia. EXAM: PORTABLE CHEST 1 VIEW COMPARISON:  03/04/2021.  03/02/2021.  03/01/2021.  CT 02/26/2021. FINDINGS: Postsurgical changes in the right apex again noted. Again patient status post removal of right apical chest tube. Slight reaccumulation of right apical pleural fluid cannot be excluded. Mild left upper lung infiltrate. No pneumothorax. Low lung volumes. Tiny bilateral basilar pleural effusions cannot be excluded. Stable cardiomegaly. IMPRESSION: 1. Postsurgical changes right apex again noted. Again patient status post removal of right apical chest tube. Slight reaccumulation of right apical pleural fluid cannot be excluded. No pneumothorax. 2. Mild left upper lung infiltrate. Low lung volumes. Tiny bilateral basilar pleural effusions cannot be excluded. Electronically Signed   By: [place]   On: 03/06/2021 07:45   DG CHEST PORT 1 VIEW  Result Date: 03/04/2021 CLINICAL DATA:  Follow-up exam. Edema. Status post chest tube. Status post right video-assisted lobectomy. EXAM: PORTABLE CHEST 1 VIEW COMPARISON:  03/03/2021 and older exams. FINDINGS: Since the earlier study, the small caliber right chest tube has been removed. No pneumothorax. Postsurgical changes in the right upper lung/hemithorax are stable. Lungs demonstrate bilateral interstitial opacities similar to the previous day's exam. No new lung abnormalities. No convincing pleural effusion. IMPRESSION: 1. Status post removal of the right-sided chest tube. 2. No pneumothorax. 3. No other change from the previous  day's study. Electronically Signed   By: Lajean Manes M.D.   On: 03/04/2021 09:22   DG Chest Port 1 View  Result Date: 03/03/2021 CLINICAL DATA:  60 year old male with history of HTN, stage III adenocarcinoma of lung, depression, recent admission 5/16-5/20 during which he underwent right upper lobe lobectomy and chest wall dissection(VATS) by Dr. Roxan Hockey, presented with severe cough, bright red hemoptysis, shortness of breath.Chest CT revealed diffuse infiltrates concerning for alveolar hemorrhage with possible superimposed infectious process/pulmonary edema. Patient was started on broad-spectrum antibiotics. EXAM: PORTABLE CHEST 1 VIEW COMPARISON:  03/02/2021 and older exams. FINDINGS: Postsurgical changes at the right lung apex are stable as is the small caliber right upper hemithorax chest tube. Interstitial lung opacities are similar to the prior study. Hazy airspace opacities noted previously have improved. No new lung abnormalities. No convincing pneumothorax or pleural effusion. IMPRESSION: 1. Improved airspace lung opacities compared to exams dating back to 02/26/2021. Mild persistent interstitial lung opacities. No new lung abnormalities. 2. Stable post surgical changes in the right upper hemithorax. No pneumothorax. Electronically Signed   By: Lajean Manes M.D.   On: 03/03/2021 08:15   DG Chest Port 1 View  Result Date: 03/02/2021 CLINICAL DATA:  VATS/right upper lobectomy.  Right chest tube. EXAM: PORTABLE CHEST 1 VIEW COMPARISON:  03/01/2021 FINDINGS: Stable position of the right apical chest tube. Residual pleural based densities at the right lung apex with postsurgical changes. Stable volume loss in the right hemithorax. Hazy interstitial lung densities bilaterally that have minimally changed. Heart size is stable. IMPRESSION: 1. Stable positioning of the right apical small bore chest tube. Aeration at the right lung apex has markedly improved since chest tube placement but there are  residual densities in this area. Negative for a large pneumothorax. 2. Diffuse interstitial lung densities that could represent interstitial pulmonary edema. Findings are similar to the recent comparison examination. Electronically Signed   By: Markus Daft M.D.   On: 03/02/2021 11:04   DG Chest Port 1 View  Result Date: 03/01/2021 CLINICAL DATA:  Status post right lung lobectomy. EXAM: PORTABLE CHEST 1 VIEW COMPARISON:  02/28/2021.  02/27/2021. FINDINGS: Right chest tube in stable position. Stable postsurgical changes right apex. Tiny residual right apical pneumothorax  cannot be completely excluded. Bilateral interstitial infiltrates/edema, slightly progressed on the left from prior exam. No prominent pleural effusion. Heart size stable. IMPRESSION: 1. Right apical chest tube in stable position. Stable postsurgical changes right apex. Tiny residual right apical pneumothorax cannot be completely excluded. 2. Bilateral interstitial infiltrates/edema slightly progressed on the left from prior exam. Electronically Signed   By: [place]   On: 03/01/2021 08:36   DG Chest Port 1 View  Result Date: 02/28/2021 CLINICAL DATA:  Chest tube.  Shortness of breath. EXAM: PORTABLE CHEST 1 VIEW COMPARISON:  02/27/2021. FINDINGS: Surgical clips right chest. Right chest tube in stable position over the right apex. Stable right apical pleural thickening and tiny pneumothorax noted. Stable right lateral pleural thickening. Bilateral pulmonary infiltrates/edema, improved from prior exam. No acute bony abnormality. IMPRESSION: 1. Surgical clips right chest. Right chest tube in stable position over the right apex. Stable right apical pleural thickening and tiny pneumothorax noted. Stable right lateral pleural thickening. 2.  Bilateral pulmonary infiltrates/edema, improved from prior exam. Electronically Signed   By: [place]   On: 02/28/2021 09:50   DG Chest Port 1 View  Result Date: 02/27/2021 CLINICAL DATA:   New hemoptysis, fatigue, worsening shortness of breath, hypoxia EXAM: PORTABLE CHEST 1 VIEW COMPARISON:  Portable exam 0835 hours compared to 02/25/2021 FINDINGS: Pigtail thoracostomy tube at RIGHT apex with decrease in loculated pleural effusion. Small loculated pneumothorax at RIGHT apex. Stable heart size and mediastinal contours. BILATERAL pulmonary infiltrates in the mid to lower lungs, increased on LEFT since previous exam. No acute osseous findings. IMPRESSION: Small residual loculated hydropneumothorax at RIGHT apex post chest tube insertion. BILATERAL mid to lower lung pulmonary infiltrates, slightly increased. Electronically Signed   By: Lavonia Dana M.D.   On: 02/27/2021 11:20   CT IMAGE GUIDED DRAINAGE BY PERCUTANEOUS CATHETER  Result Date: 02/26/2021 INDICATION: 60 year old male with a history recent surgery, presentation with hemoptysis, and loculated fluid of the surgery site. He has been referred for CT-guided chest tube placement EXAM: CT-GUIDED CHEST TUBE MEDICATIONS: The patient is currently admitted to the hospital and receiving intravenous antibiotics. The antibiotics were administered within an appropriate time frame prior to the initiation of the procedure. ANESTHESIA/SEDATION: 1.0 mg IV Versed 25 mcg IV Fentanyl Moderate Sedation Time:  16 minutes The patient was continuously monitored during the procedure by the interventional radiology nurse under my direct supervision. COMPLICATIONS: None TECHNIQUE: Informed written consent was obtained from the patient after a thorough discussion of the procedural risks, benefits and alternatives. All questions were addressed. Maximal Sterile Barrier Technique was utilized including caps, mask, sterile gowns, sterile gloves, sterile drape, hand hygiene and skin antiseptic. A timeout was performed prior to the initiation of the procedure. PROCEDURE: Patient was positioned in the supine position on the CT gantry table and a scout CT of the chest was  performed for planning purposes. Once angle of approach was determined, the skin and subcutaneous tissues this scan was prepped and draped in the usual sterile fashion, and a sterile drape was applied covering the operative field. A sterile gown and sterile gloves were used for the procedure. Local anesthesia was provided with 1% Lidocaine. The skin and subcutaneous tissues were infiltrated 1% lidocaine for local anesthesia, and a small stab incision was made with an 11 blade scalpel. Using CT guidance, a 18 gauge trocar needle was advanced into the pleural space of the right apex. After confirmation of the tip, modified Seldinger technique was used to place a 10  French pigtail drainage catheter. Approximately 60 cc of reddish thin fluid aspirated. Culture was sent to the lab for analysis. Catheter was then attached to water seal chamber and suction was applied confirming a operational chest tube. Retention suture was placed.  Sterile dressing was placed. Patient tolerated the procedure well and remained hemodynamically stable throughout. No complications were encountered and no significant blood loss was encounter FINDINGS: The scout CT demonstrates essentially a similar distribution and volume of confluent ground-glass opacity of the right lower lobe, right middle lobe, and left lower lobe. No significant fluid identified within the airways. Similar appearance of the loculated fluid collection within the surgical site at the right apex. After placement of the chest tube approximately 60 cc of thin reddish fluid aspirated which appears to be serosanguineous. No frank abscess. Culture was sent. IMPRESSION: Status post CT-guided right apical chest tube into loculated fluid revealing serosanguineous fluid. Culture was sent. Signed, Dulcy Fanny. Dellia Nims, RPVI [facility] Electronically Signed   By: Corrie Mckusick D.O.   On: 02/26/2021 17:09     Subjective: He  is breathing better., Dyspnea on exertion improved. He feels ready to go home.  He will be discharge on Oxygen.   Discharge Exam: Vitals:   03/07/21 1137 03/07/21 1149  BP: 138/75 136/81  Pulse: 81 83  Resp: (!) 27 19  Temp: 98.3 F (36.8 C) 98.3 F (36.8 C)  SpO2: 94% 92%     General: Pt is alert, awake, not in acute distress Cardiovascular: RRR, S1/S2 +, no rubs, no gallops Respiratory: CTA bilaterally, no wheezing, no rhonchi Abdominal: Soft, NT, ND, bowel sounds + Extremities: no edema, no cyanosis    The results of significant diagnostics from this hospitalization (including imaging, microbiology, ancillary and laboratory) are listed below for reference.     Microbiology: Recent Results (from the past 240 hour(s))  Resp Panel by RT-PCR (Flu A&B, Covid) Nasopharyngeal Swab     Status: None   Collection Time: 02/26/21  2:13 AM   Specimen: Nasopharyngeal Swab; Nasopharyngeal(NP) swabs in vial transport medium  Result Value Ref Range Status   SARS Coronavirus 2 by RT PCR NEGATIVE NEGATIVE Final    Comment: (NOTE) SARS-CoV-2 target nucleic acids are NOT DETECTED.  The SARS-CoV-2 RNA is generally detectable in upper respiratory specimens during the acute phase of infection. The lowest concentration of SARS-CoV-2 viral copies this assay can detect is 138 copies/mL. A negative result does not preclude SARS-Cov-2 infection and should not be used as the sole basis for treatment or other patient management decisions. A negative result may occur with  improper specimen collection/handling, submission of specimen other than nasopharyngeal swab, presence of viral mutation(s) within the areas targeted by this assay, and inadequate number of viral copies(<138 copies/mL). A negative result must be combined with clinical observations, patient history, and epidemiological information. The expected result is Negative.  Fact Sheet for Patients:   EntrepreneurPulse.com.au  Fact Sheet for Healthcare Providers:  IncredibleEmployment.be  This test is no t yet approved or cleared by the [place] FDA and  has been authorized for detection and/or diagnosis of SARS-CoV-2 by FDA under an Emergency Use Authorization (EUA). This EUA will remain  in effect (meaning this test can be used) for the duration of the COVID-19 declaration under Section 564(b)(1) of the Act, 21 U.S.C.section 360bbb-3(b)(1), unless the authorization is terminated  or revoked sooner.       Influenza A by PCR NEGATIVE NEGATIVE Final  Influenza B by PCR NEGATIVE NEGATIVE Final    Comment: (NOTE) The Xpert Xpress SARS-CoV-2/FLU/RSV plus assay is intended as an aid in the diagnosis of influenza from Nasopharyngeal swab specimens and should not be used as a sole basis for treatment. Nasal washings and aspirates are unacceptable for Xpert Xpress SARS-CoV-2/FLU/RSV testing.  Fact Sheet for Patients: EntrepreneurPulse.com.au  Fact Sheet for Healthcare Providers: IncredibleEmployment.be  This test is not yet approved or cleared by the [place] FDA and has been authorized for detection and/or diagnosis of SARS-CoV-2 by FDA under an Emergency Use Authorization (EUA). This EUA will remain in effect (meaning this test can be used) for the duration of the COVID-19 declaration under Section 564(b)(1) of the Act, 21 U.S.C. section 360bbb-3(b)(1), unless the authorization is terminated or revoked.  Performed at [facility], [place] [address], [place], [place] [address]   Blood Culture (routine x 2)     Status: None   Collection Time: 02/26/21  2:59 AM   Specimen: BLOOD RIGHT FOREARM  Result Value Ref Range Status   Specimen Description BLOOD RIGHT FOREARM  Final   Special Requests   Final    BOTTLES DRAWN AEROBIC AND ANAEROBIC Blood Culture adequate volume   Culture   Final    NO  GROWTH 5 DAYS Performed at [facility], [place] [address] [place], [place] [address]    Report Status 03/03/2021 FINAL  Final  MRSA Next Gen by PCR, Nasal     Status: None   Collection Time: 02/26/21  6:04 AM   Specimen: Nasal Mucosa; Nasal Swab  Result Value Ref Range Status   MRSA by PCR Next Gen NOT DETECTED NOT DETECTED Final    Comment: (NOTE) The GeneXpert MRSA Assay (FDA approved for NASAL specimens only), is one component of a comprehensive MRSA colonization surveillance program. It is not intended to diagnose MRSA infection nor to guide or monitor treatment for MRSA infections. Test performance is not FDA approved in patients less than 76 years old. Performed at [facility], [place] [address], [place], [place] [address]   Blood Culture (routine x 2)     Status: None   Collection Time: 02/26/21 12:15 PM   Specimen: BLOOD RIGHT HAND  Result Value Ref Range Status   Specimen Description BLOOD RIGHT HAND  Final   Special Requests   Final    BOTTLES DRAWN AEROBIC AND ANAEROBIC Blood Culture adequate volume   Culture   Final    NO GROWTH 5 DAYS Performed at [facility], [place] [address] [place], [place] [address]    Report Status 03/03/2021 FINAL  Final  Aerobic/Anaerobic Culture w Gram Stain (surgical/deep wound)     Status: None   Collection Time: 02/26/21  4:00 PM   Specimen: Pleural Fluid  Result Value Ref Range Status   Specimen Description FLUID PLEURAL RIGHT  Final   Special Requests NONE  Final   Gram Stain   Final    RARE WBC PRESENT,BOTH PMN AND MONONUCLEAR NO ORGANISMS SEEN    Culture   Final    No growth aerobically or anaerobically. Performed at [facility], [place] [address] [place],  [address]    Report Status 03/04/2021 FINAL  Final  Surgical pcr screen     Status: None   Collection Time: 02/28/21  7:59 AM   Specimen: Nasal Mucosa; Nasal Swab  Result Value Ref Range Status   MRSA, PCR NEGATIVE NEGATIVE Final  Staphylococcus aureus NEGATIVE NEGATIVE Final    Comment: (NOTE) The Xpert SA Assay (FDA approved for NASAL specimens in patients 76 years of age and older), is one component of a comprehensive surveillance program. It is not intended to diagnose infection nor to guide or monitor treatment. Performed at [facility], [place] [address], [place], [place] [address]      Labs: BNP (last 3 results) No results for input(s): BNP in the last 8760 hours. Basic Metabolic Panel: Recent Labs  Lab 03/02/21 1254 03/03/21 0024 03/04/21 0052 03/05/21 0013 03/06/21 0108 03/06/21 0359 03/07/21 0035  NA 132* 137 137 137 133* 135 137  K 3.0* 4.1 3.6 4.1 7.0* 3.9 3.6  CL 97* 101 104 103 100 101 102  CO2 25 27 26 27 23 27 28   GLUCOSE 177* 191* 189* 222* 233* 186* 144*  BUN 10 18 22* 19 17 17 18   CREATININE 0.52* 0.61 0.61 0.60* 0.57* 0.56* 0.60*  CALCIUM 8.5* 9.2 9.0 8.8* Null_Result 8.9 8.7*  MG 2.0 2.3  --   --   --   --   --    Liver Function Tests: No results for input(s): AST, ALT, ALKPHOS, BILITOT, PROT, ALBUMIN in the last 168 hours. No results for input(s): LIPASE, AMYLASE in the last 168 hours. No results for input(s): AMMONIA in the last 168 hours. CBC: Recent Labs  Lab 03/01/21 0023 03/02/21 1254 03/03/21 0024 03/04/21 0052 03/05/21 0013 03/06/21 0108  WBC 16.7* 17.1* 13.4* 19.0* 15.8* 14.6*  NEUTROABS 13.0* 13.7*  --  16.8* 13.4* 12.5*  HGB 11.7* 12.0* 10.9* 10.2* 10.1* 10.7*  HCT 34.8* 35.2* 32.0* 30.4* 30.2* 32.4*  MCV 91.3 89.8 89.4 90.5 91.2 91.3  PLT 332 366 361 426* 480* 569*   Cardiac Enzymes: No results for input(s): CKTOTAL, CKMB, CKMBINDEX, TROPONINI in the last 168 hours. BNP: Invalid input(s): POCBNP CBG: No results for input(s): GLUCAP in the last 168 hours. D-Dimer No results for input(s): DDIMER in the last 72 hours. Hgb A1c No results for input(s): HGBA1C in the last 72 hours. Lipid Profile No results for input(s): CHOL, HDL, LDLCALC, TRIG,  CHOLHDL, LDLDIRECT in the last 72 hours. Thyroid function studies No results for input(s): TSH, T4TOTAL, T3FREE, THYROIDAB in the last 72 hours.  Invalid input(s): FREET3 Anemia work up No results for input(s): VITAMINB12, FOLATE, FERRITIN, TIBC, IRON, RETICCTPCT in the last 72 hours. Urinalysis    Component Value Date/Time   COLORURINE YELLOW 01/18/2021 1500   APPEARANCEUR CLEAR 01/18/2021 1500   LABSPEC 1.006 01/18/2021 1500   PHURINE 6.0 01/18/2021 1500   GLUCOSEU NEGATIVE 01/18/2021 1500   HGBUR SMALL (A) 01/18/2021 1500   BILIRUBINUR NEGATIVE 01/18/2021 1500   KETONESUR NEGATIVE 01/18/2021 1500   PROTEINUR NEGATIVE 01/18/2021 1500   NITRITE NEGATIVE 01/18/2021 1500   LEUKOCYTESUR NEGATIVE 01/18/2021 1500   Sepsis Labs Invalid input(s): PROCALCITONIN,  WBC,  LACTICIDVEN Microbiology Recent Results (from the past 240 hour(s))  Resp Panel by RT-PCR (Flu A&B, Covid) Nasopharyngeal Swab     Status: None   Collection Time: 02/26/21  2:13 AM   Specimen: Nasopharyngeal Swab; Nasopharyngeal(NP) swabs in vial transport medium  Result Value Ref Range Status   SARS Coronavirus 2 by RT PCR NEGATIVE NEGATIVE Final    Comment: (NOTE) SARS-CoV-2 target nucleic acids are NOT DETECTED.  The SARS-CoV-2 RNA is generally detectable in upper respiratory specimens during the acute phase of infection. The lowest concentration of SARS-CoV-2 viral copies this assay can detect is 138 copies/mL.  A negative result does not preclude SARS-Cov-2 infection and should not be used as the sole basis for treatment or other patient management decisions. A negative result may occur with  improper specimen collection/handling, submission of specimen other than nasopharyngeal swab, presence of viral mutation(s) within the areas targeted by this assay, and inadequate number of viral copies(<138 copies/mL). A negative result must be combined with clinical observations, patient history, and  epidemiological information. The expected result is Negative.  Fact Sheet for Patients:  EntrepreneurPulse.com.au  Fact Sheet for Healthcare Providers:  IncredibleEmployment.be  This test is no t yet approved or cleared by the [place] FDA and  has been authorized for detection and/or diagnosis of SARS-CoV-2 by FDA under an Emergency Use Authorization (EUA). This EUA will remain  in effect (meaning this test can be used) for the duration of the COVID-19 declaration under Section 564(b)(1) of the Act, 21 U.S.C.section 360bbb-3(b)(1), unless the authorization is terminated  or revoked sooner.       Influenza A by PCR NEGATIVE NEGATIVE Final   Influenza B by PCR NEGATIVE NEGATIVE Final    Comment: (NOTE) The Xpert Xpress SARS-CoV-2/FLU/RSV plus assay is intended as an aid in the diagnosis of influenza from Nasopharyngeal swab specimens and should not be used as a sole basis for treatment. Nasal washings and aspirates are unacceptable for Xpert Xpress SARS-CoV-2/FLU/RSV testing.  Fact Sheet for Patients: EntrepreneurPulse.com.au  Fact Sheet for Healthcare Providers: IncredibleEmployment.be  This test is not yet approved or cleared by the [place] FDA and has been authorized for detection and/or diagnosis of SARS-CoV-2 by FDA under an Emergency Use Authorization (EUA). This EUA will remain in effect (meaning this test can be used) for the duration of the COVID-19 declaration under Section 564(b)(1) of the Act, 21 U.S.C. section 360bbb-3(b)(1), unless the authorization is terminated or revoked.  Performed at [facility], [place] [address] [place], [place] [address]   Blood Culture (routine x 2)     Status: None   Collection Time: 02/26/21  2:59 AM   Specimen: BLOOD RIGHT FOREARM  Result Value Ref Range Status   Specimen Description BLOOD RIGHT FOREARM  Final   Special Requests   Final     BOTTLES DRAWN AEROBIC AND ANAEROBIC Blood Culture adequate volume   Culture   Final    NO GROWTH 5 DAYS Performed at [facility], [place] [address] [place], [place] [address]    Report Status 03/03/2021 FINAL  Final  MRSA Next Gen by PCR, Nasal     Status: None   Collection Time: 02/26/21  6:04 AM   Specimen: Nasal Mucosa; Nasal Swab  Result Value Ref Range Status   MRSA by PCR Next Gen NOT DETECTED NOT DETECTED Final    Comment: (NOTE) The GeneXpert MRSA Assay (FDA approved for NASAL specimens only), is one component of a comprehensive MRSA colonization surveillance program. It is not intended to diagnose MRSA infection nor to guide or monitor treatment for MRSA infections. Test performance is not FDA approved in patients less than 57 years old. Performed at [facility], [place] [address] [place], [place] [address]   Blood Culture (routine x 2)     Status: None   Collection Time: 02/26/21 12:15 PM   Specimen: BLOOD RIGHT HAND  Result Value Ref Range Status   Specimen Description BLOOD RIGHT HAND  Final   Special Requests   Final    BOTTLES DRAWN AEROBIC AND ANAEROBIC Blood Culture adequate  volume   Culture   Final    NO GROWTH 5 DAYS Performed at [facility], [place] [address] [place], [place] [address]    Report Status 03/03/2021 FINAL  Final  Aerobic/Anaerobic Culture w Gram Stain (surgical/deep wound)     Status: None   Collection Time: 02/26/21  4:00 PM   Specimen: Pleural Fluid  Result Value Ref Range Status   Specimen Description FLUID PLEURAL RIGHT  Final   Special Requests NONE  Final   Gram Stain   Final    RARE WBC PRESENT,BOTH PMN AND MONONUCLEAR NO ORGANISMS SEEN    Culture   Final    No growth aerobically or anaerobically. Performed at [facility], [place] [address], [place], [place] [address]    Report Status 03/04/2021 FINAL  Final  Surgical pcr screen     Status: None   Collection Time: 02/28/21  7:59 AM   Specimen: Nasal Mucosa;  Nasal Swab  Result Value Ref Range Status   MRSA, PCR NEGATIVE NEGATIVE Final   Staphylococcus aureus NEGATIVE NEGATIVE Final    Comment: (NOTE) The Xpert SA Assay (FDA approved for NASAL specimens in patients 54 years of age and older), is one component of a comprehensive surveillance program. It is not intended to diagnose infection nor to guide or monitor treatment. Performed at [facility], [place] [address] [place], [place] [address]      Time coordinating discharge: 40 minutes  SIGNED:   Elmarie Shiley, MD  [facility]

## 2021-03-07 NOTE — Discharge Instructions (Signed)
Follow up with your PCP for lab work (cbc and Bmet)/  You will be discharge on prednisone taper and water pill (lasix for 3 days)

## 2021-03-07 NOTE — Plan of Care (Signed)
  Problem: Education: Goal: Knowledge of General Education information will improve Description: Including pain rating scale, medication(s)/side effects and non-pharmacologic comfort measures 03/07/2021 1327 by Thressa Sheller, RN Outcome: Adequate for Discharge 03/07/2021 1327 by Thressa Sheller, RN Outcome: Adequate for Discharge   Problem: Health Behavior/Discharge Planning: Goal: Ability to manage health-related needs will improve 03/07/2021 1327 by Thressa Sheller, RN Outcome: Adequate for Discharge 03/07/2021 1327 by Thressa Sheller, RN Outcome: Adequate for Discharge   Problem: Clinical Measurements: Goal: Ability to maintain clinical measurements within normal limits will improve 03/07/2021 1327 by Thressa Sheller, RN Outcome: Adequate for Discharge 03/07/2021 1327 by Thressa Sheller, RN Outcome: Adequate for Discharge Goal: Will remain free from infection 03/07/2021 1327 by Thressa Sheller, RN Outcome: Adequate for Discharge 03/07/2021 1327 by Thressa Sheller, RN Outcome: Adequate for Discharge Goal: Diagnostic test results will improve 03/07/2021 1327 by Thressa Sheller, RN Outcome: Adequate for Discharge 03/07/2021 1327 by Thressa Sheller, RN Outcome: Adequate for Discharge Goal: Respiratory complications will improve 03/07/2021 1327 by Thressa Sheller, RN Outcome: Adequate for Discharge 03/07/2021 1327 by Thressa Sheller, RN Outcome: Adequate for Discharge Goal: Cardiovascular complication will be avoided 03/07/2021 1327 by Thressa Sheller, RN Outcome: Adequate for Discharge 03/07/2021 1327 by Thressa Sheller, RN Outcome: Adequate for Discharge   Problem: Activity: Goal: Risk for activity intolerance will decrease 03/07/2021 1327 by Thressa Sheller, RN Outcome: Adequate for Discharge 03/07/2021 1327 by Thressa Sheller, RN Outcome: Adequate for Discharge   Problem: Nutrition: Goal: Adequate nutrition will be  maintained 03/07/2021 1327 by Thressa Sheller, RN Outcome: Adequate for Discharge 03/07/2021 1327 by Thressa Sheller, RN Outcome: Adequate for Discharge   Problem: Coping: Goal: Level of anxiety will decrease 03/07/2021 1327 by Thressa Sheller, RN Outcome: Adequate for Discharge 03/07/2021 1327 by Thressa Sheller, RN Outcome: Adequate for Discharge   Problem: Elimination: Goal: Will not experience complications related to bowel motility 03/07/2021 1327 by Thressa Sheller, RN Outcome: Adequate for Discharge 03/07/2021 1327 by Thressa Sheller, RN Outcome: Adequate for Discharge Goal: Will not experience complications related to urinary retention 03/07/2021 1327 by Thressa Sheller, RN Outcome: Adequate for Discharge 03/07/2021 1327 by Thressa Sheller, RN Outcome: Adequate for Discharge   Problem: Pain Managment: Goal: General experience of comfort will improve 03/07/2021 1327 by Thressa Sheller, RN Outcome: Adequate for Discharge 03/07/2021 1327 by Thressa Sheller, RN Outcome: Adequate for Discharge   Problem: Safety: Goal: Ability to remain free from injury will improve 03/07/2021 1327 by Thressa Sheller, RN Outcome: Adequate for Discharge 03/07/2021 1327 by Thressa Sheller, RN Outcome: Adequate for Discharge   Problem: Skin Integrity: Goal: Risk for impaired skin integrity will decrease 03/07/2021 1327 by Thressa Sheller, RN Outcome: Adequate for Discharge 03/07/2021 1327 by Thressa Sheller, RN Outcome: Adequate for Discharge

## 2021-03-07 NOTE — Progress Notes (Signed)
      [address]       [place] [address]             [phone]      Mr. Toman is being discharged today  Going home with O2  Will arrange follow up in our office next week  Will also make sure he has appointments with Med onc and Rad Onc in near future  [place] C. Roxan Hockey, MD [facility] [phone]

## 2021-03-08 ENCOUNTER — Telehealth: Payer: Self-pay | Admitting: *Deleted

## 2021-03-08 NOTE — Telephone Encounter (Signed)
I received a message from Dr. Leonarda Salon office that Mr. Mikesell needed to be seen with Dr. Julien Nordmann.  I updated Dr. Julien Nordmann and he is able to see patient today if needed. I called but was unable to reach Mr. Blasco. I did leave vm message with my name and phone number to call.

## 2021-03-08 NOTE — Progress Notes (Signed)
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 Roxan Hockey  -Right upper lobectomy and node dissection on 01/22/2021, mass was invading the chest wall and there was a positive margin posteriorly near the spine.  Past/Anticipated interventions by medical oncology, if any:  Dr. Julien Nordmann 03/21/2021  Signs/Symptoms Weight changes, if any: He reports he has not weighed himself in a while but feels that he has lost some weight.   Respiratory complaints, if any: Productive cough.  He notes that he has been getting more congested over the past couple of days and says he could hear the "crackles". Hemoptysis, if any: He reports coughing up streaks of blood this morning.   Pain issues, if any:  He continues to have pain from his initial surgery back in May.  He is taking Dilaudid and gabapentin.  SAFETY ISSUES: Prior radiation? No Pacemaker/ICD? No  Possible current pregnancy? N/a Is the patient on methotrexate? No  Current Complaints / other details:

## 2021-03-13 ENCOUNTER — Ambulatory Visit
Admission: RE | Admit: 2021-03-13 | Discharge: 2021-03-13 | Disposition: A | Discharge status: Home / Self Care | Payer: 59 | Source: Ambulatory Visit | Attending: Radiation Oncology | Admitting: Radiation Oncology

## 2021-03-13 ENCOUNTER — Ambulatory Visit (INDEPENDENT_AMBULATORY_CARE_PROVIDER_SITE_OTHER): Payer: Self-pay | Admitting: Physician Assistant

## 2021-03-13 ENCOUNTER — Encounter: Payer: Self-pay | Admitting: Radiation Oncology

## 2021-03-13 ENCOUNTER — Other Ambulatory Visit: Payer: Self-pay

## 2021-03-13 ENCOUNTER — Other Ambulatory Visit: Payer: Self-pay | Admitting: *Deleted

## 2021-03-13 ENCOUNTER — Other Ambulatory Visit: Payer: Self-pay | Admitting: Radiation Oncology

## 2021-03-13 ENCOUNTER — Ambulatory Visit
Admission: RE | Admit: 2021-03-13 | Discharge: 2021-03-13 | Disposition: A | Discharge status: Home / Self Care | Payer: 59 | Source: Ambulatory Visit | Attending: Thoracic Surgery (Cardiothoracic Vascular Surgery) | Admitting: Thoracic Surgery (Cardiothoracic Vascular Surgery)

## 2021-03-13 ENCOUNTER — Encounter: Payer: Self-pay | Admitting: Physician Assistant

## 2021-03-13 VITALS — Ht 70.0 in | Wt 183.0 lb

## 2021-03-13 VITALS — BP 127/82 | HR 96 | Resp 20 | Ht 70.0 in | Wt 176.0 lb

## 2021-03-13 DIAGNOSIS — C3411 Malignant neoplasm of upper lobe, right bronchus or lung: Secondary | ICD-10-CM

## 2021-03-13 DIAGNOSIS — Z902 Acquired absence of lung [part of]: Secondary | ICD-10-CM

## 2021-03-13 DIAGNOSIS — Z09 Encounter for follow-up examination after completed treatment for conditions other than malignant neoplasm: Secondary | ICD-10-CM

## 2021-03-13 IMAGING — DX DG CHEST 2V
2 series · 2 of 2 positions shown · non-contrast
Comparison: [DATE].

CLINICAL DATA: Status post video assisted thorascopic surgery.

EXAM:
CHEST - 2 VIEW

[dg chest 2 view (1 of 2)]
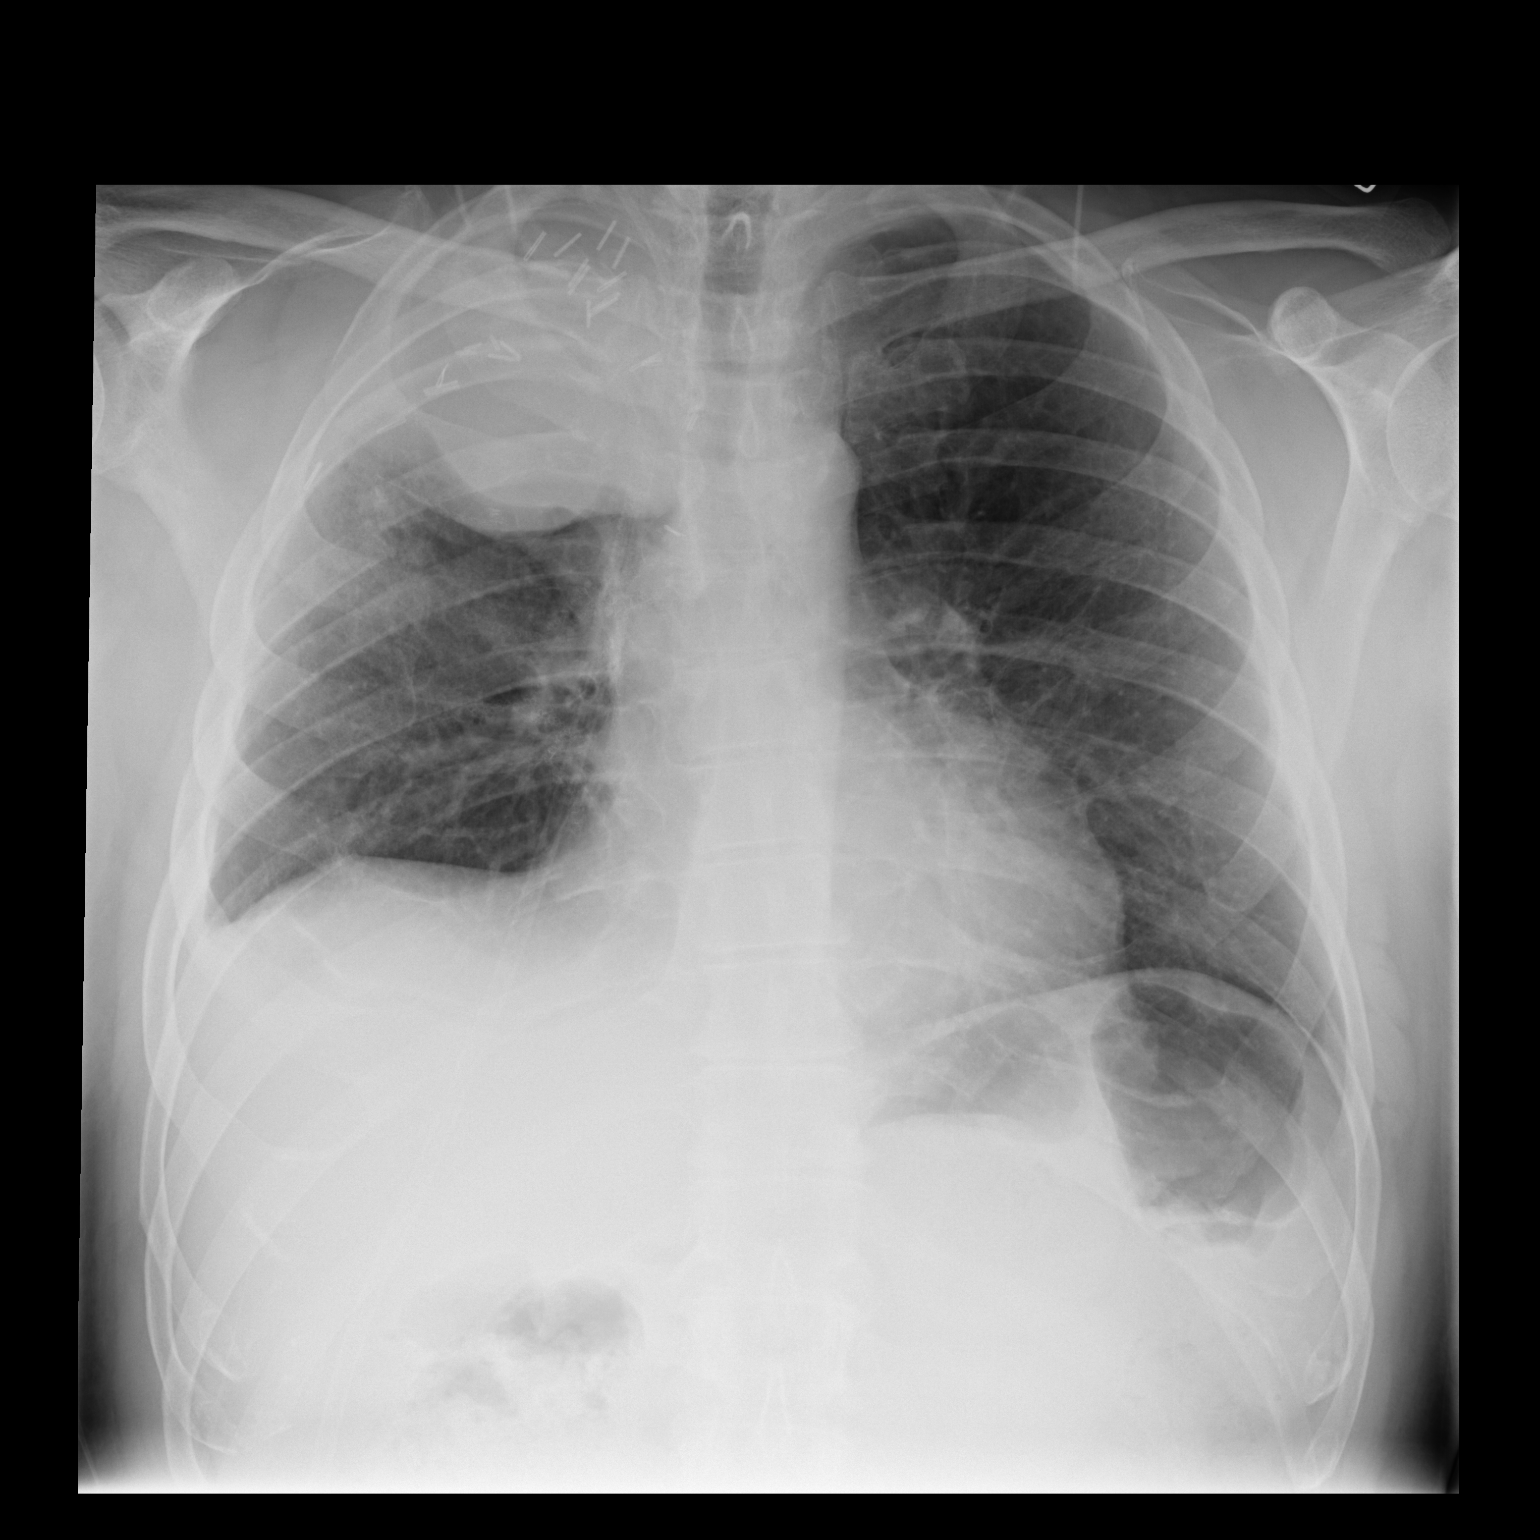

[dg chest 2 view (2 of 2)]
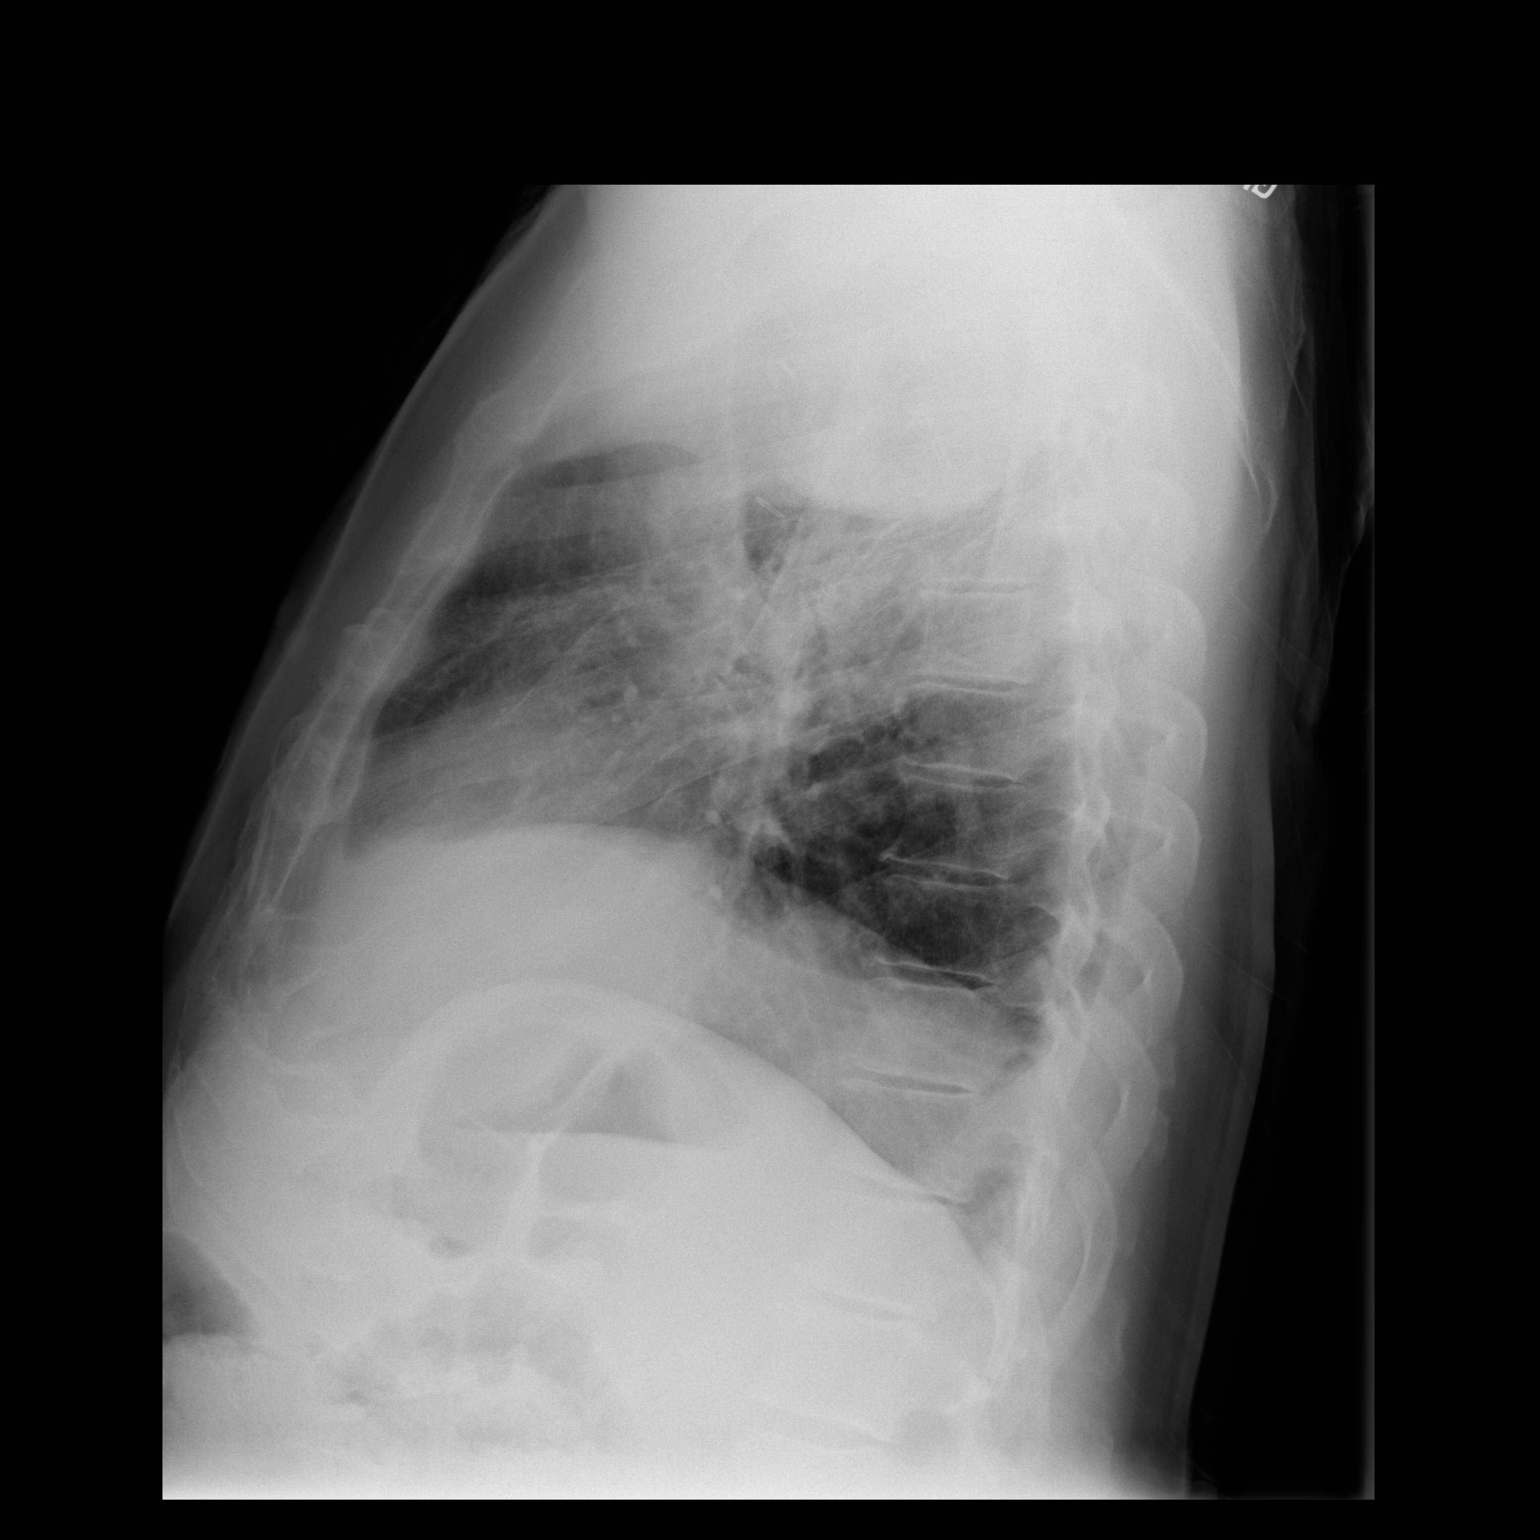

[2 of 2 positions shown; findings below may reference images not displayed]

FINDINGS: The heart size and mediastinal contours are within normal limits.
Left lung is clear. Stable postsurgical changes are noted in the
right lung apex. Minimal right basilar subsegmental atelectasis or
scarring is noted with small right pleural effusion. The visualized
skeletal structures are unremarkable.
IMPRESSION: Stable postsurgical changes seen in right lung apex. Minimal right
basilar subsegmental atelectasis or scarring is noted with probable
small right pleural effusion.

## 2021-03-13 MED ORDER — TAMSULOSIN HCL 0.4 MG PO CAPS: 0.4000 mg | ORAL_CAPSULE | Freq: Every day | ORAL | 1 refills | Status: DC

## 2021-03-13 MED ORDER — HYDROMORPHONE HCL 2 MG PO TABS: 2.0000 mg | ORAL_TABLET | ORAL | 0 refills | Status: DC | PRN

## 2021-03-13 NOTE — Addendum Note (Signed)
Encounter addended by: Kyung Rudd, MD on: 03/13/2021 12:21 PM  Actions taken: Pharmacy for encounter modified, Order list changed

## 2021-03-13 NOTE — Progress Notes (Signed)
[address]       [place] [address]             [phone]     Christopher Carter is a 60 y.o. male patient who was recently hospitalized at [facility] from 6 /19-6 /29 with a past medical history of severe cough, bright red hemoptysis and shortness of breath.  This patient is well-known to [place] due to his recent admission from 5/16-5/20 where he underwent a right upper lobectomy and lymph node dissection by Dr. Roxan Hockey.  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  The patient was started on broad-spectrum IV antibiotics and completed a total of 7 days during his hospitalization.  He was also placed on a prednisone taper which was dosed over the next 2 weeks.   Patient had a complicated hospital stay and at 1 time was requiring high flow nasal cannula 25 L.  Pulmonary/CCM was consulted and was assisting with care.  A right-sided chest tube was placed for a hemothorax.  He has an appointment set up with pulmonology on 7/15.   Today, during his appointment he does have headache that has lasted the last 2 days but has improved this afternoon in addition he still has some thoracotomy pain.  He has noticed some "wheezing" which she describes as popping in his chest.  He did have episodes of hemoptysis x2 with about a quarter sized amount of bright red blood.  He has been intermittently taking his oxygen saturation and it has been in the mid 90s and he remains on 4 L oxygen via nasal cannula which was what he was discharged on.  He knows about his follow-up appointments with Dr. Earlie Server and pulmonology.  1. S/P lobectomy of lung   2. Surgery follow-up examination   3. Primary adenocarcinoma of upper lobe of right lung [facility])    Past Medical History:  Diagnosis Date   Anxiety    Cancer ([place]) 12/2020   Depression    Hypertension     Pneumonia    1990's   No past surgical history pertinent negatives on file. Scheduled Meds: Current Outpatient Medications on File Prior to Visit  Medication Sig Dispense Refill   acetaminophen (TYLENOL) 500 MG tablet Take 500 mg by mouth 2 (two) times daily as needed for moderate pain or headache.     amLODipine (NORVASC) 10 MG tablet TAKE 1 TABLET BY MOUTH EVERY DAY (Patient taking differently: Take 10 mg by mouth daily.) 30 tablet 1   Ascorbic Acid (VITAMIN C) 1000 MG tablet Take 1,000 mg by mouth daily.     furosemide (LASIX) 20 MG tablet Take 1 tablet (20 mg total) by mouth daily. 3 tablet 0   gabapentin (NEURONTIN) 300 MG capsule Take 300 mg by mouth 3 (three) times daily.     irbesartan (AVAPRO) 300 MG tablet Take 300 mg by mouth daily.     latanoprost (XALATAN) 0.005 % ophthalmic solution Place 1 drop into both eyes at bedtime.     lidocaine (LIDODERM) 5 % Place 1 patch onto the skin daily. Remove & Discard patch within 12 hours or as directed by MD 30 patch 0   pantoprazole (PROTONIX) 40 MG tablet Take 1 tablet (40 mg total) by mouth daily. 30 tablet 0   polyethylene glycol (MIRALAX / GLYCOLAX) 17 g packet Take 17 g by mouth daily  as needed for mild constipation. 14 each 0   potassium chloride SA (KLOR-CON) 20 MEQ tablet Take 1 tablet (20 mEq total) by mouth daily. 3 tablet 0   predniSONE (DELTASONE) 20 MG tablet Take 40 mg daily for 5 days, then 20 mg daily for 5 days then 10 mg daily for 5 days then stop. 20 tablet 0   QUEtiapine (SEROQUEL) 50 MG tablet Take 50 mg by mouth at bedtime.     sertraline (ZOLOFT) 100 MG tablet Take 100 mg by mouth daily.     vitamin B-12 (CYANOCOBALAMIN) 1000 MCG tablet Take 1,000 mcg by mouth daily.     No current facility-administered medications on file prior to visit.     Allergies  Allergen Reactions   Oxycodone Hcl Other (See Comments)    Pt reported "seeing things" and " feeling unusual."   Bupropion Nausea And Vomiting   Active  Problems:   * No active hospital problems. *  Blood pressure 127/82, pulse 96, resp. rate 20, height 5\' 10"  (1.778 m), weight 176 lb (79.8 kg), SpO2 98 %.  Cor: RRR, no murmur Pulm: CTA bilaterally and in all fields Abd: no tenderness Ext: no edema Wound: well healed thoracotomy incision  Subjective Objective: Vital signs (most recent): Blood pressure 127/82, pulse 96, resp. rate 20, height 5\' 10"  (1.778 m), weight 176 lb (79.8 kg), SpO2 98 %. Assessment & Plan  CLINICAL DATA:  Status post video assisted thorascopic surgery.   EXAM: CHEST - 2 VIEW   COMPARISON:  March 06, 2021.   FINDINGS: The heart size and mediastinal contours are within normal limits. Left lung is clear. Stable postsurgical changes are noted in the right lung apex. Minimal right basilar subsegmental atelectasis or scarring is noted with small right pleural effusion. The visualized skeletal structures are unremarkable.   IMPRESSION: Stable postsurgical changes seen in right lung apex. Minimal right basilar subsegmental atelectasis or scarring is noted with probable small right pleural effusion.     Electronically Signed   By: Marijo Conception M.D.   On: 03/13/2021 14:43   Assessment:  1.  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 2.  Hemoptysis with recent hospitalization. Hemoptysis x 2 today bight red blood about a quarter size amount. CBC ordered  3. Headache  Plan:   [name] was discussed with Dr. Kipp Brood and it was decided to hold off on admission.  He is getting around okay and his oxygen saturation has been in the mid 90s, in addition he has not had any increase in his shortness of breath.  He remains on 4 L nasal cannula which she was discharged home with.  He is still having some significant pain from his thoracotomy incision and I did send a refill for his hydromorphone, however when speaking with  the pharmacist there had already been a prescription sent by another provider so I canceled my prescription.  He is having some mild headaches in addition to some popping in his chest that he describes as wheezing.  I have contacted [place] pulmonology and moved of his appointment to Monday 7/11 at 11 AM.  I do not think he needs any surgical intervention at this time by way of a chest tube, however he will likely need a CT of the chest in the near future to evaluate him further.  We have arranged a follow-up appointment next week with Dr. Kipp Brood after  he sees his pulmonologist to coordinate care.  If his hemoptysis worsens or he has additional symptoms arise he is to go to the emergency department for care.  No medication changes. CBC ordered. Will need CT scan of the chest in the near future.   Appointments: Pulmonary 7/11, Dr. Kipp Brood 7/13   Elgie Collard 03/13/2021

## 2021-03-15 ENCOUNTER — Emergency Department (HOSPITAL_COMMUNITY): Payer: 59

## 2021-03-15 ENCOUNTER — Telehealth: Payer: Self-pay | Admitting: Primary Care

## 2021-03-15 ENCOUNTER — Encounter (HOSPITAL_COMMUNITY): Payer: Self-pay | Admitting: Emergency Medicine

## 2021-03-15 ENCOUNTER — Emergency Department (HOSPITAL_COMMUNITY)
Admission: EM | Admit: 2021-03-15 | Discharge: 2021-03-15 | Disposition: A | Discharge status: Home / Self Care | Payer: 59 | Attending: Emergency Medicine | Admitting: Emergency Medicine

## 2021-03-15 ENCOUNTER — Ambulatory Visit: Payer: 59

## 2021-03-15 DIAGNOSIS — M549 Dorsalgia, unspecified: Secondary | ICD-10-CM | POA: Insufficient documentation

## 2021-03-15 DIAGNOSIS — Z5321 Procedure and treatment not carried out due to patient leaving prior to being seen by health care provider: Secondary | ICD-10-CM | POA: Insufficient documentation

## 2021-03-15 DIAGNOSIS — R042 Hemoptysis: Secondary | ICD-10-CM | POA: Insufficient documentation

## 2021-03-15 DIAGNOSIS — R079 Chest pain, unspecified: Secondary | ICD-10-CM | POA: Diagnosis not present

## 2021-03-15 LAB — COMPREHENSIVE METABOLIC PANEL
ALT: 36 U/L (ref 0–44)
AST: 12 U/L — ABNORMAL LOW (ref 15–41)
Albumin: 3.1 g/dL — ABNORMAL LOW (ref 3.5–5.0)
Alkaline Phosphatase: 94 U/L (ref 38–126)
Anion gap: 10 (ref 5–15)
BUN: 7 mg/dL (ref 6–20)
CO2: 27 mmol/L (ref 22–32)
Calcium: 9.3 mg/dL (ref 8.9–10.3)
Chloride: 96 mmol/L — ABNORMAL LOW (ref 98–111)
Creatinine, Ser: 0.68 mg/dL (ref 0.61–1.24)
GFR, Estimated: >60 mL/min (ref >60)
Glucose, Bld: 164 mg/dL — ABNORMAL HIGH (ref 70–99)
Potassium: 4.5 mmol/L (ref 3.5–5.1)
Sodium: 133 mmol/L — ABNORMAL LOW (ref 135–145)
Total Bilirubin: 0.8 mg/dL (ref 0.3–1.2)
Total Protein: 6.7 g/dL (ref 6.5–8.1)

## 2021-03-15 LAB — CBC WITH DIFFERENTIAL/PLATELET
Abs Immature Granulocytes: 0 10*3/uL (ref 0.00–0.07)
Basophils Absolute: 0.3 10*3/uL — ABNORMAL HIGH (ref 0.0–0.1)
Basophils Relative: 1 %
Eosinophils Absolute: 0.3 10*3/uL (ref 0.0–0.5)
Eosinophils Relative: 1 %
HCT: 35.7 % — ABNORMAL LOW (ref 39.0–52.0)
Hemoglobin: 11.5 g/dL — ABNORMAL LOW (ref 13.0–17.0)
Lymphocytes Relative: 6 %
Lymphs Abs: 1.8 10*3/uL (ref 0.7–4.0)
MCH: 30.3 pg (ref 26.0–34.0)
MCHC: 32.2 g/dL (ref 30.0–36.0)
MCV: 93.9 fL (ref 80.0–100.0)
Monocytes Absolute: 2.1 10*3/uL — ABNORMAL HIGH (ref 0.1–1.0)
Monocytes Relative: 7 %
Neutro Abs: 25.5 10*3/uL — ABNORMAL HIGH (ref 1.7–7.7)
Neutrophils Relative %: 85 %
Platelets: 574 10*3/uL — ABNORMAL HIGH (ref 150–400)
RBC: 3.8 MIL/uL — ABNORMAL LOW (ref 4.22–5.81)
RDW: 15.3 % (ref 11.5–15.5)
WBC: 30 10*3/uL — ABNORMAL HIGH (ref 4.0–10.5)
nRBC: 0 % (ref 0.0–0.2)
nRBC: 0 /100 WBC

## 2021-03-15 LAB — LACTIC ACID, PLASMA: Lactic Acid, Venous: 1.7 mmol/L (ref 0.5–1.9)

## 2021-03-15 IMAGING — CR DG CHEST 2V
2 series · 2 of 2 positions shown · non-contrast
Comparison: Prior chest radiograph [DATE] and earlier.

CLINICAL DATA: Provided history: Chest pain. Additional history
provided: Hemoptysis and pain after lobectomy for lung cancer.
Shortness of breath, posterior right upper chest pain.

EXAM:
CHEST - 2 VIEW

[chest pa]
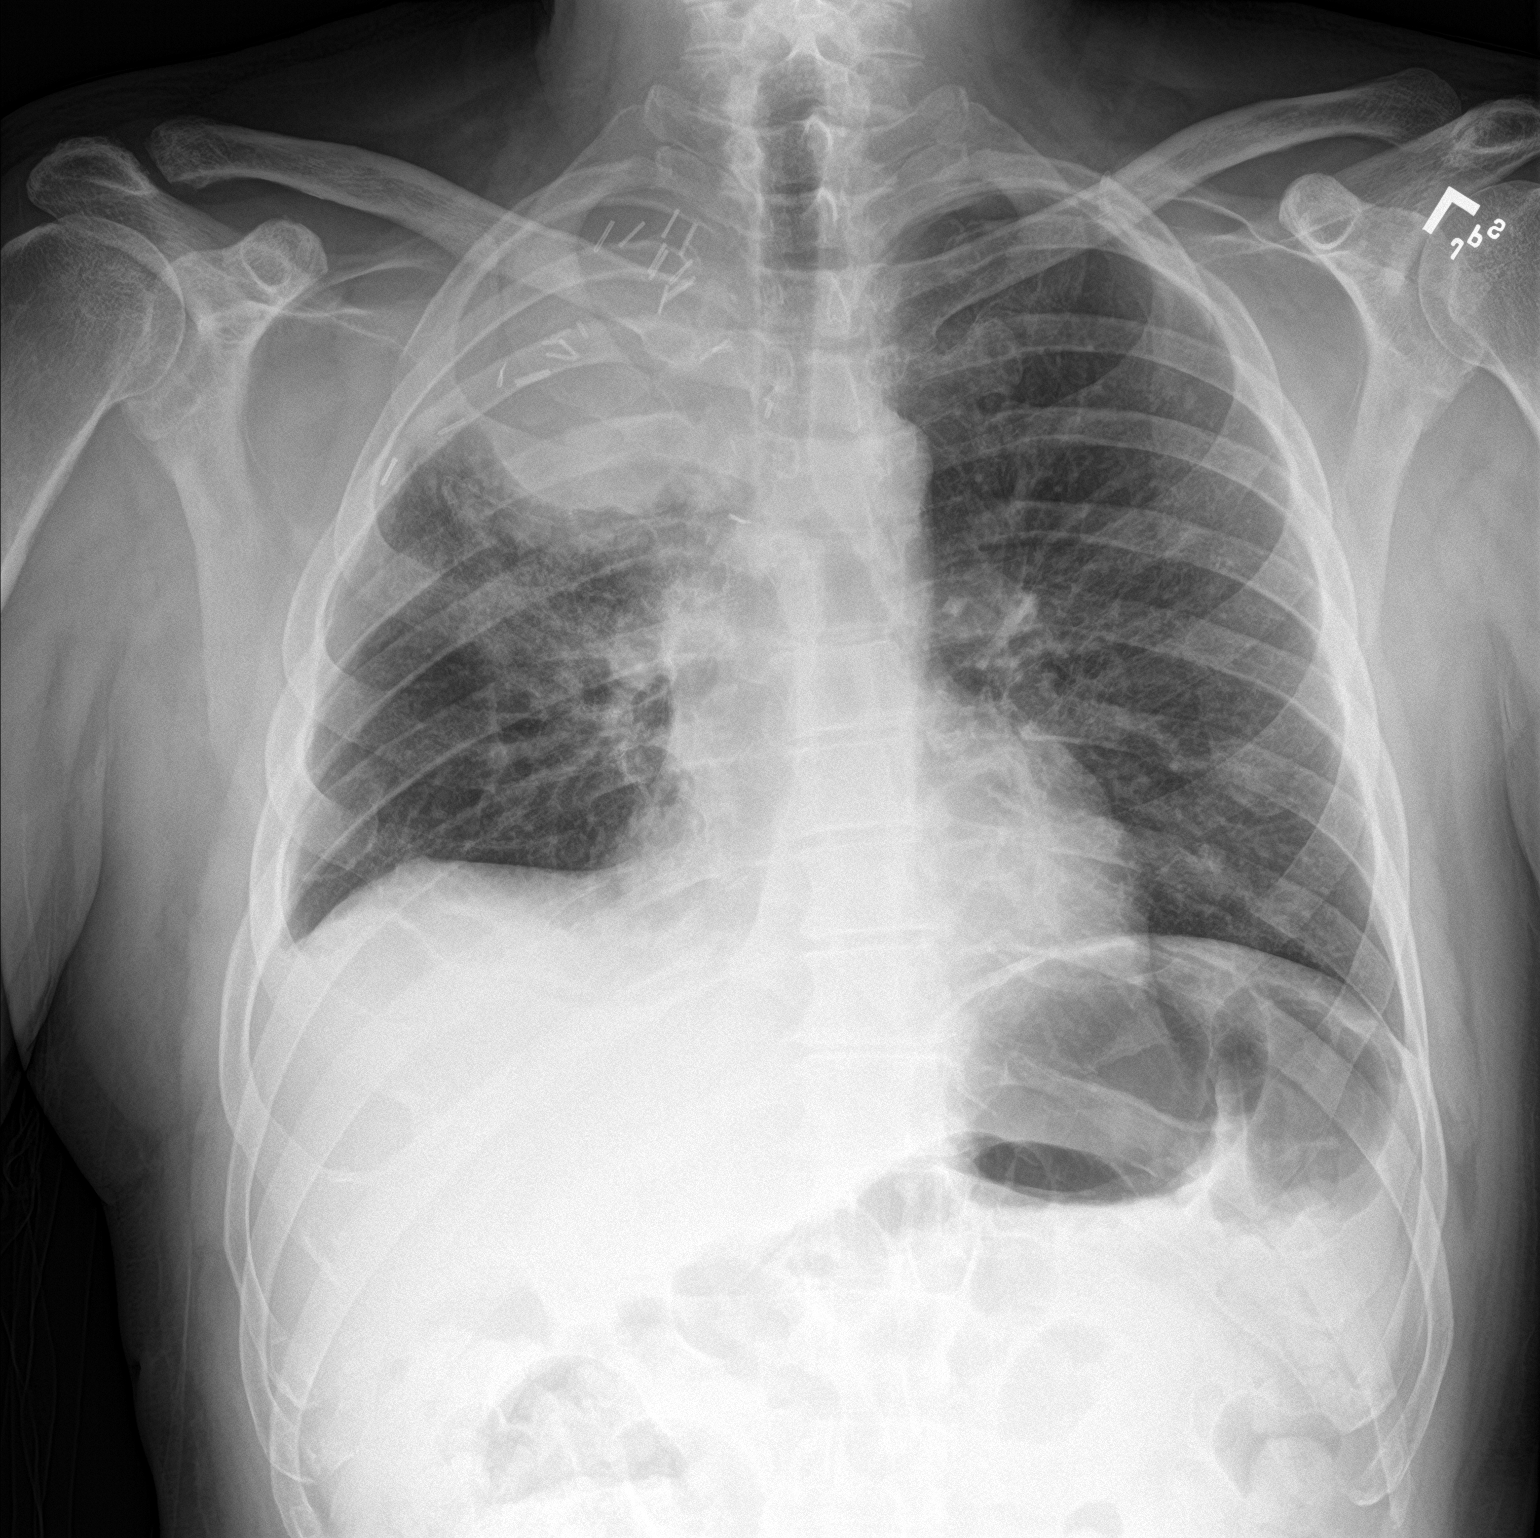

[chest lat]
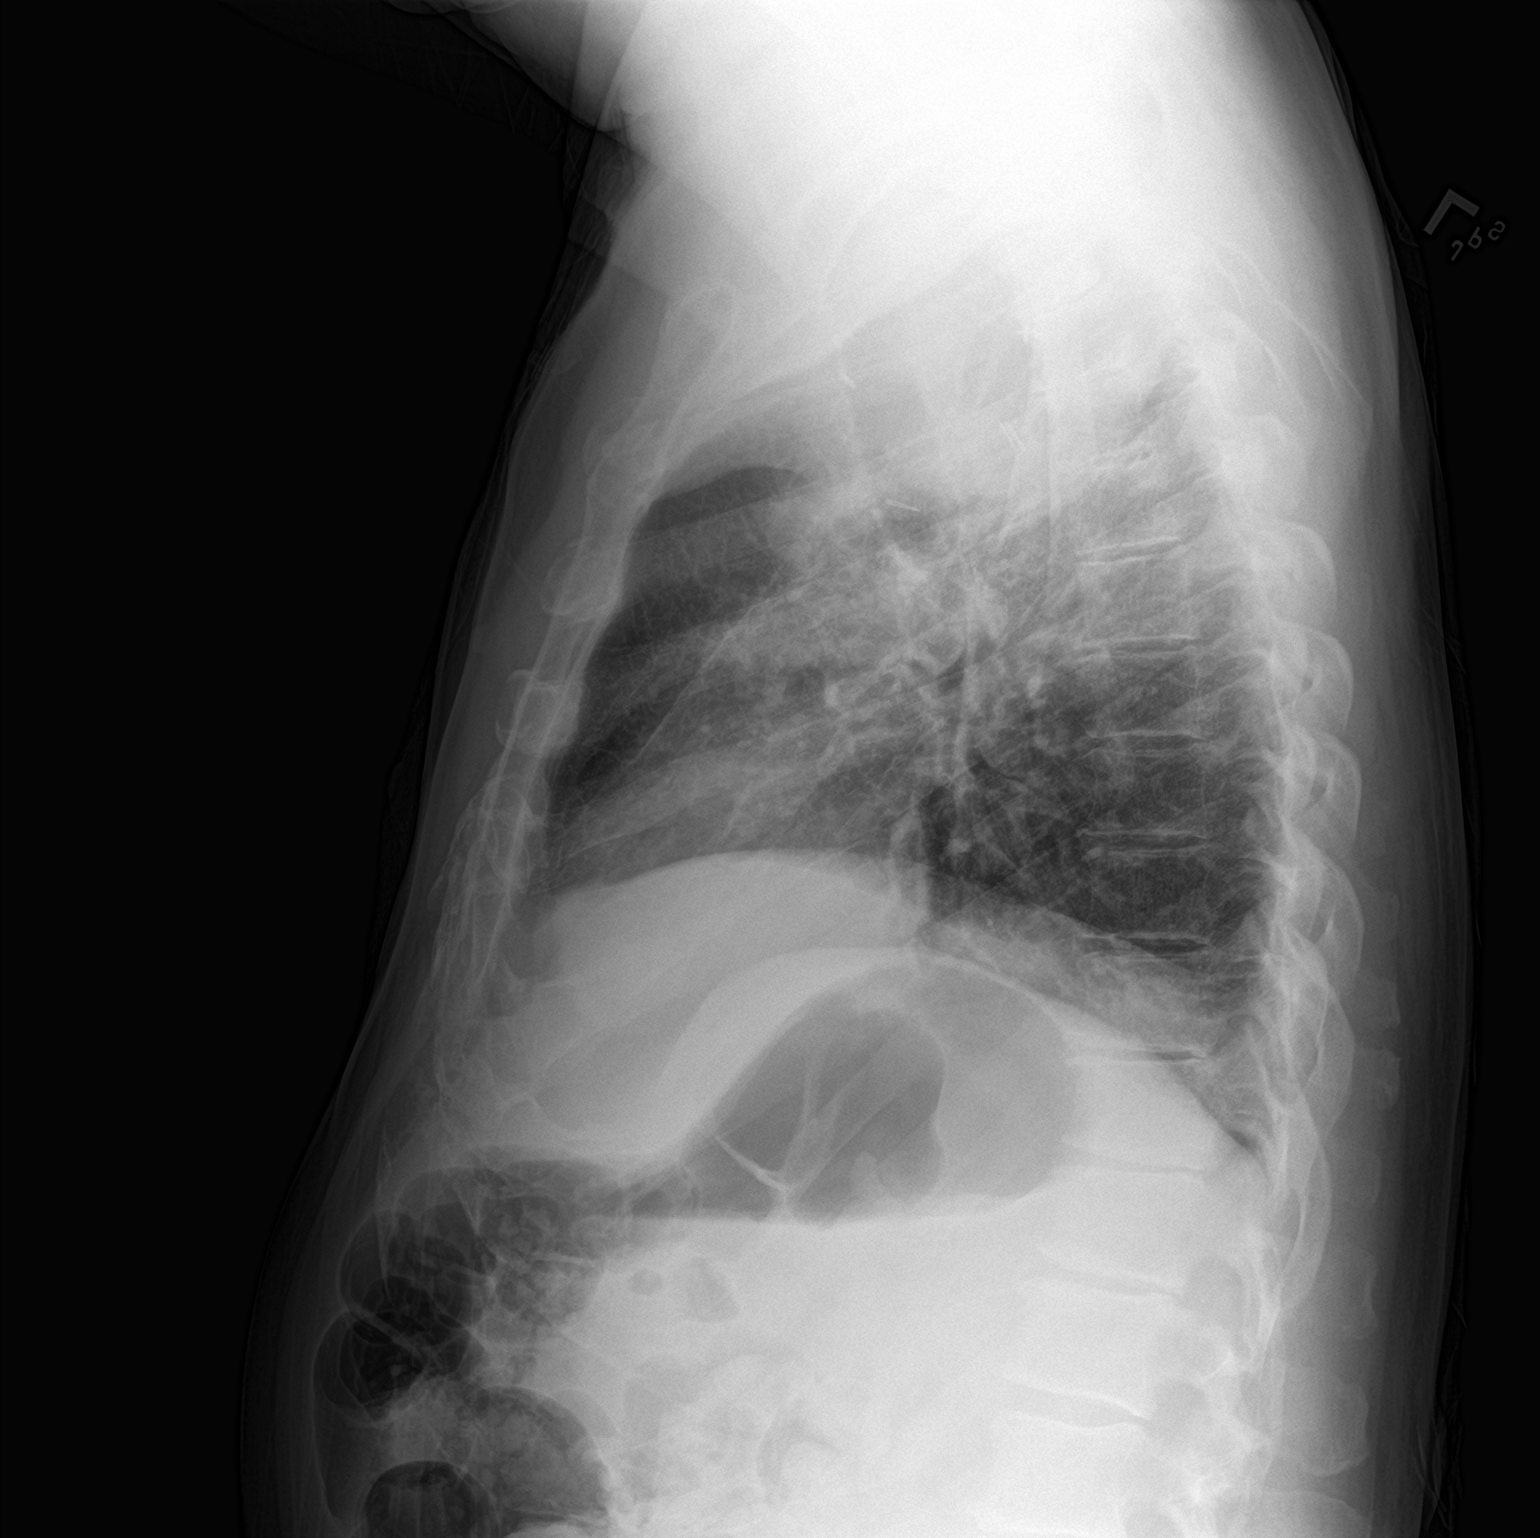

[2 of 2 positions shown; findings below may reference images not displayed]

FINDINGS: Heart size within normal limits. Redemonstrated postsurgical changes
within the right lung apex. Ill-defined airspace opacity within the
right mid lung field, new as compared to the chest radiograph of
[DATE]. The left lung remains clear. Persistent pleural
thickening or trace pleural effusion at the right lung base. No
evidence of pneumothorax. No acute bony abnormality identified.
IMPRESSION: Nonspecific airspace disease within the right mid lung field, new
from the chest radiographs of [DATE]. Primary consideration
include pneumonia, atelectasis or hemorrhage.

Redemonstrated postsurgical changes within the right lung apex.

Persistent pleural thickening or trace pleural effusion at the right
lung base.

## 2021-03-15 MED ORDER — HYDROCODONE-ACETAMINOPHEN 5-325 MG PO TABS: 1.0000 | ORAL_TABLET | Freq: Once | ORAL | Status: DC

## 2021-03-15 MED ORDER — ACETAMINOPHEN 325 MG PO TABS: 650.0000 mg | ORAL_TABLET | Freq: Once | ORAL | Status: DC

## 2021-03-15 NOTE — ED Notes (Signed)
Patient called for room in back with no response.

## 2021-03-15 NOTE — ED Triage Notes (Addendum)
Patient complaining of hemoptysis and pain after a lobectomy for lung cancer. Per EMS patient was evaluated by office that performed lobectomy and they stated no problems with surgical site. Patient alert, oriented, and in no apparent distress at this time. EMS states self administered 2mg  dilaudid at 1100 today.  Hr 102 BP 122/86  94% 5L [place]

## 2021-03-15 NOTE — ED Provider Notes (Signed)
Emergency Medicine Provider Triage Evaluation Note  Christopher Carter , a 60 y.o. male  was evaluated in triage.  Pt complains of hemoptysis, chest pain and back pain.  Symptoms have been going on for the past 2 days.  He had a repeat x-ray done by his surgeon 2 days ago without any concerning findings.  He presents here for continued pain despite taking Dilaudid.  Denies any vomiting  Review of Systems  Positive: Chest pain, back pain, hemoptysis Negative: Vomiting  Physical Exam  BP 113/69   Pulse (!) 117   Temp (!) 100.8 F (38.2 C) (Oral)   Resp (!) 22   SpO2 96%  Gen:   Awake, no distress   Resp:  Normal effort  MSK:   Moves extremities without difficulty  Other:  4 L of oxygen via nasal cannula  Medical Decision Making  Medically screening exam initiated at 12:52 PM.  Appropriate orders placed.  Irwin A Fillinger was informed that the remainder of the evaluation will be completed by another provider, this initial triage assessment does not replace that evaluation, and the importance of remaining in the ED until their evaluation is complete.  He is tachycardic and febrile here, will order labs   Delia Heady, PA-C 03/15/21 1254    Noemi Chapel, MD 03/16/21 [phone]

## 2021-03-15 NOTE — Telephone Encounter (Signed)
Called and spoke with patient's wife, [name] and let her know that he has never been seen in this office, he has an appointment on Monday with Geraldo Pitter NP, however, he is not an established patient.  I advised her to call Dr. Leonarda Salon office.  I let her know that once he is an established patient we can assist in the future.  She verbalized understanding.  Nothing further needed.

## 2021-03-16 ENCOUNTER — Telehealth: Payer: Self-pay | Admitting: Medical Oncology

## 2021-03-16 NOTE — Telephone Encounter (Signed)
[facility] nurse navigator called and reported  concern for pts safety ( suicidal ideation-shooting himself)  because of pain and to request palliative care.     She called wife to remove all guns from the house and she said wife did remove them. .  I gave her information for [facility] and to send pt to ED. I also recommended calling PCP as Julien Nordmann only saw pt one time. She was going to do that . She wanted [place] to be aware of t his information. Pt is scheduled for F/U appts next week.

## 2021-03-18 ENCOUNTER — Encounter (HOSPITAL_BASED_OUTPATIENT_CLINIC_OR_DEPARTMENT_OTHER): Payer: Self-pay | Admitting: Emergency Medicine

## 2021-03-18 ENCOUNTER — Emergency Department (HOSPITAL_BASED_OUTPATIENT_CLINIC_OR_DEPARTMENT_OTHER): Payer: 59

## 2021-03-18 ENCOUNTER — Inpatient Hospital Stay (HOSPITAL_BASED_OUTPATIENT_CLINIC_OR_DEPARTMENT_OTHER)
Admission: EM | Admit: 2021-03-18 | Discharge: 2021-04-10 | DRG: 180 | Disposition: A | Discharge status: Home / Self Care | Payer: 59 | Attending: Internal Medicine | Admitting: Internal Medicine

## 2021-03-18 ENCOUNTER — Other Ambulatory Visit: Payer: Self-pay | Admitting: Thoracic Surgery (Cardiothoracic Vascular Surgery)

## 2021-03-18 ENCOUNTER — Other Ambulatory Visit: Payer: Self-pay

## 2021-03-18 DIAGNOSIS — F329 Major depressive disorder, single episode, unspecified: Secondary | ICD-10-CM | POA: Diagnosis not present

## 2021-03-18 DIAGNOSIS — R0489 Hemorrhage from other sites in respiratory passages: Secondary | ICD-10-CM | POA: Diagnosis not present

## 2021-03-18 DIAGNOSIS — Z902 Acquired absence of lung [part of]: Secondary | ICD-10-CM

## 2021-03-18 DIAGNOSIS — R000 Tachycardia, unspecified: Secondary | ICD-10-CM | POA: Diagnosis not present

## 2021-03-18 DIAGNOSIS — M549 Dorsalgia, unspecified: Secondary | ICD-10-CM

## 2021-03-18 DIAGNOSIS — Z885 Allergy status to narcotic agent status: Secondary | ICD-10-CM

## 2021-03-18 DIAGNOSIS — K219 Gastro-esophageal reflux disease without esophagitis: Secondary | ICD-10-CM | POA: Diagnosis present

## 2021-03-18 DIAGNOSIS — G893 Neoplasm related pain (acute) (chronic): Secondary | ICD-10-CM | POA: Diagnosis present

## 2021-03-18 DIAGNOSIS — K0381 Cracked tooth: Secondary | ICD-10-CM | POA: Diagnosis present

## 2021-03-18 DIAGNOSIS — J9811 Atelectasis: Secondary | ICD-10-CM | POA: Diagnosis present

## 2021-03-18 DIAGNOSIS — R069 Unspecified abnormalities of breathing: Secondary | ICD-10-CM

## 2021-03-18 DIAGNOSIS — T380X5A Adverse effect of glucocorticoids and synthetic analogues, initial encounter: Secondary | ICD-10-CM | POA: Diagnosis not present

## 2021-03-18 DIAGNOSIS — A419 Sepsis, unspecified organism: Secondary | ICD-10-CM | POA: Diagnosis not present

## 2021-03-18 DIAGNOSIS — K0889 Other specified disorders of teeth and supporting structures: Secondary | ICD-10-CM | POA: Diagnosis present

## 2021-03-18 DIAGNOSIS — E611 Iron deficiency: Secondary | ICD-10-CM | POA: Diagnosis present

## 2021-03-18 DIAGNOSIS — Z87891 Personal history of nicotine dependence: Secondary | ICD-10-CM

## 2021-03-18 DIAGNOSIS — M25552 Pain in left hip: Secondary | ICD-10-CM

## 2021-03-18 DIAGNOSIS — Z888 Allergy status to other drugs, medicaments and biological substances status: Secondary | ICD-10-CM

## 2021-03-18 DIAGNOSIS — D630 Anemia in neoplastic disease: Secondary | ICD-10-CM | POA: Diagnosis present

## 2021-03-18 DIAGNOSIS — C3491 Malignant neoplasm of unspecified part of right bronchus or lung: Secondary | ICD-10-CM

## 2021-03-18 DIAGNOSIS — R45851 Suicidal ideations: Secondary | ICD-10-CM | POA: Diagnosis present

## 2021-03-18 DIAGNOSIS — J189 Pneumonia, unspecified organism: Secondary | ICD-10-CM

## 2021-03-18 DIAGNOSIS — R6520 Severe sepsis without septic shock: Secondary | ICD-10-CM | POA: Diagnosis not present

## 2021-03-18 DIAGNOSIS — J851 Abscess of lung with pneumonia: Secondary | ICD-10-CM

## 2021-03-18 DIAGNOSIS — D75839 Thrombocytosis, unspecified: Secondary | ICD-10-CM | POA: Diagnosis present

## 2021-03-18 DIAGNOSIS — R52 Pain, unspecified: Secondary | ICD-10-CM | POA: Diagnosis not present

## 2021-03-18 DIAGNOSIS — I10 Essential (primary) hypertension: Secondary | ICD-10-CM | POA: Diagnosis not present

## 2021-03-18 DIAGNOSIS — D72829 Elevated white blood cell count, unspecified: Secondary | ICD-10-CM | POA: Diagnosis not present

## 2021-03-18 DIAGNOSIS — R042 Hemoptysis: Secondary | ICD-10-CM | POA: Diagnosis present

## 2021-03-18 DIAGNOSIS — I2510 Atherosclerotic heart disease of native coronary artery without angina pectoris: Secondary | ICD-10-CM | POA: Diagnosis present

## 2021-03-18 DIAGNOSIS — F419 Anxiety disorder, unspecified: Secondary | ICD-10-CM | POA: Diagnosis present

## 2021-03-18 DIAGNOSIS — R599 Enlarged lymph nodes, unspecified: Secondary | ICD-10-CM | POA: Diagnosis present

## 2021-03-18 DIAGNOSIS — K047 Periapical abscess without sinus: Secondary | ICD-10-CM | POA: Diagnosis present

## 2021-03-18 DIAGNOSIS — C3411 Malignant neoplasm of upper lobe, right bronchus or lung: Principal | ICD-10-CM | POA: Diagnosis present

## 2021-03-18 DIAGNOSIS — R233 Spontaneous ecchymoses: Secondary | ICD-10-CM | POA: Diagnosis present

## 2021-03-18 DIAGNOSIS — I959 Hypotension, unspecified: Secondary | ICD-10-CM | POA: Diagnosis not present

## 2021-03-18 DIAGNOSIS — R251 Tremor, unspecified: Secondary | ICD-10-CM | POA: Diagnosis not present

## 2021-03-18 DIAGNOSIS — Z20822 Contact with and (suspected) exposure to covid-19: Secondary | ICD-10-CM | POA: Diagnosis present

## 2021-03-18 DIAGNOSIS — Z79899 Other long term (current) drug therapy: Secondary | ICD-10-CM

## 2021-03-18 DIAGNOSIS — Z515 Encounter for palliative care: Secondary | ICD-10-CM

## 2021-03-18 DIAGNOSIS — J9621 Acute and chronic respiratory failure with hypoxia: Secondary | ICD-10-CM | POA: Diagnosis present

## 2021-03-18 DIAGNOSIS — Z9981 Dependence on supplemental oxygen: Secondary | ICD-10-CM

## 2021-03-18 DIAGNOSIS — R0602 Shortness of breath: Secondary | ICD-10-CM

## 2021-03-18 DIAGNOSIS — E1165 Type 2 diabetes mellitus with hyperglycemia: Secondary | ICD-10-CM | POA: Diagnosis not present

## 2021-03-18 DIAGNOSIS — M25551 Pain in right hip: Secondary | ICD-10-CM

## 2021-03-18 DIAGNOSIS — Z4682 Encounter for fitting and adjustment of non-vascular catheter: Secondary | ICD-10-CM

## 2021-03-18 DIAGNOSIS — R509 Fever, unspecified: Secondary | ICD-10-CM | POA: Diagnosis not present

## 2021-03-18 DIAGNOSIS — J90 Pleural effusion, not elsewhere classified: Secondary | ICD-10-CM | POA: Diagnosis not present

## 2021-03-18 DIAGNOSIS — J9690 Respiratory failure, unspecified, unspecified whether with hypoxia or hypercapnia: Secondary | ICD-10-CM

## 2021-03-18 DIAGNOSIS — J942 Hemothorax: Secondary | ICD-10-CM | POA: Diagnosis not present

## 2021-03-18 DIAGNOSIS — D649 Anemia, unspecified: Secondary | ICD-10-CM | POA: Diagnosis present

## 2021-03-18 LAB — CBC WITH DIFFERENTIAL/PLATELET
Abs Immature Granulocytes: 0.14 10*3/uL — ABNORMAL HIGH (ref 0.00–0.07)
Basophils Absolute: 0.1 10*3/uL (ref 0.0–0.1)
Basophils Relative: 0 %
Eosinophils Absolute: 0.9 10*3/uL — ABNORMAL HIGH (ref 0.0–0.5)
Eosinophils Relative: 5 %
HCT: 33.1 % — ABNORMAL LOW (ref 39.0–52.0)
Hemoglobin: 10.8 g/dL — ABNORMAL LOW (ref 13.0–17.0)
Immature Granulocytes: 1 %
Lymphocytes Relative: 11 %
Lymphs Abs: 1.9 10*3/uL (ref 0.7–4.0)
MCH: 29.7 pg (ref 26.0–34.0)
MCHC: 32.6 g/dL (ref 30.0–36.0)
MCV: 90.9 fL (ref 80.0–100.0)
Monocytes Absolute: 1.8 10*3/uL — ABNORMAL HIGH (ref 0.1–1.0)
Monocytes Relative: 10 %
Neutro Abs: 13.2 10*3/uL — ABNORMAL HIGH (ref 1.7–7.7)
Neutrophils Relative %: 73 %
Platelets: 405 10*3/uL — ABNORMAL HIGH (ref 150–400)
RBC: 3.64 MIL/uL — ABNORMAL LOW (ref 4.22–5.81)
RDW: 14.7 % (ref 11.5–15.5)
WBC: 18.1 10*3/uL — ABNORMAL HIGH (ref 4.0–10.5)
nRBC: 0 % (ref 0.0–0.2)

## 2021-03-18 LAB — RESP PANEL BY RT-PCR (FLU A&B, COVID) ARPGX2
Influenza A by PCR: NEGATIVE
Influenza B by PCR: NEGATIVE
SARS Coronavirus 2 by RT PCR: NEGATIVE

## 2021-03-18 LAB — BASIC METABOLIC PANEL
Anion gap: 10 (ref 5–15)
BUN: 13 mg/dL (ref 6–20)
CO2: 26 mmol/L (ref 22–32)
Calcium: 9 mg/dL (ref 8.9–10.3)
Chloride: 98 mmol/L (ref 98–111)
Creatinine, Ser: 0.5 mg/dL — ABNORMAL LOW (ref 0.61–1.24)
GFR, Estimated: >60 mL/min (ref >60)
Glucose, Bld: 172 mg/dL — ABNORMAL HIGH (ref 70–99)
Potassium: 4 mmol/L (ref 3.5–5.1)
Sodium: 134 mmol/L — ABNORMAL LOW (ref 135–145)

## 2021-03-18 LAB — HEMOGLOBIN AND HEMATOCRIT, BLOOD
HCT: 29 % — ABNORMAL LOW (ref 39.0–52.0)
HCT: 29.9 % — ABNORMAL LOW (ref 39.0–52.0)
Hemoglobin: 9.3 g/dL — ABNORMAL LOW (ref 13.0–17.0)
Hemoglobin: 9.8 g/dL — ABNORMAL LOW (ref 13.0–17.0)

## 2021-03-18 LAB — TYPE AND SCREEN
ABO/RH(D): O POS
Antibody Screen: NEGATIVE

## 2021-03-18 LAB — PROCALCITONIN: Procalcitonin: <0.1 ng/mL

## 2021-03-18 LAB — PROTIME-INR
INR: 1 (ref 0.8–1.2)
INR: 1 (ref 0.8–1.2)
Prothrombin Time: 13.1 seconds (ref 11.4–15.2)
Prothrombin Time: 13.5 seconds (ref 11.4–15.2)

## 2021-03-18 IMAGING — CT CT CHEST W/ CM
2 of 4 series · 15 of 36 positions shown, 18 images · IV contrast (APPLIED)
Comparison: [DATE]

CLINICAL DATA: Hemoptysis.  Recent diagnosis of lung cancer

EXAM:
CT CHEST WITH CONTRAST
TECHNIQUE: Multidetector CT imaging of the chest was performed during
intravenous contrast administration.
CONTRAST:  75mL OMNIPAQUE IOHEXOL 300 MG/ML  SOLN

[Series 2: routine chest with · axial · 0.71mm/px · z∈[-643,-379]mm · 12 of 156 slices shown, 15 images]
[im 12/156  mediastinal]
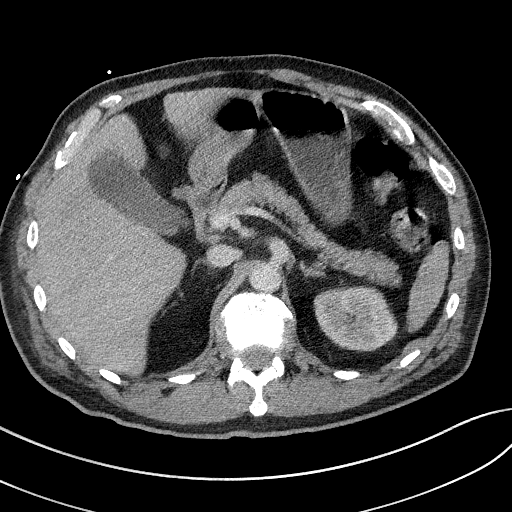
[im 12/156  lung]
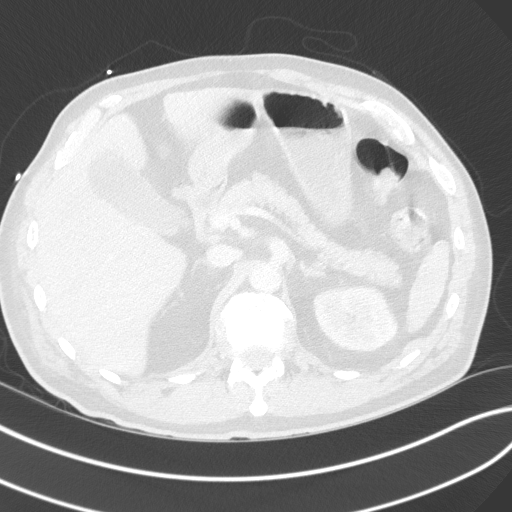
[im 24/156  lung]
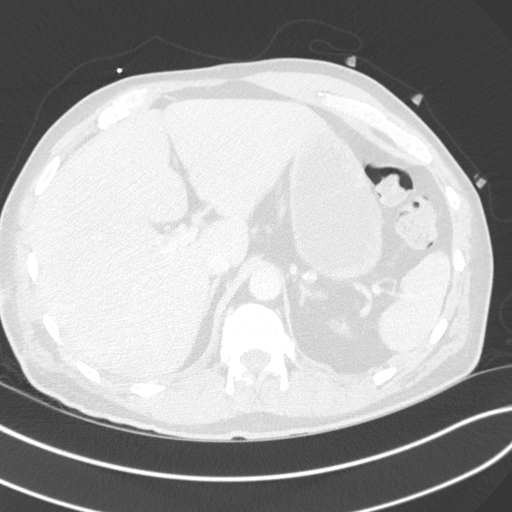
[im 36/156  lung]
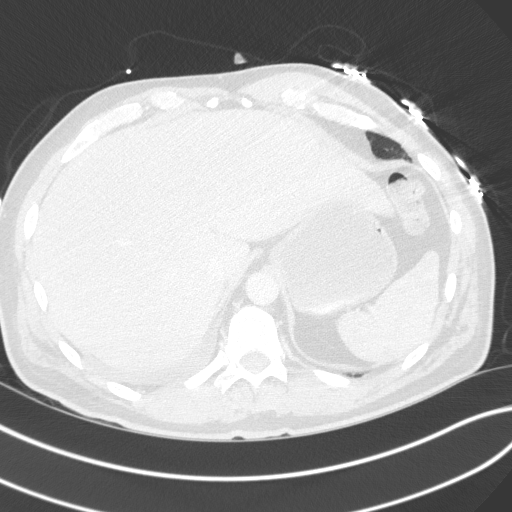
[im 48/156  lung]
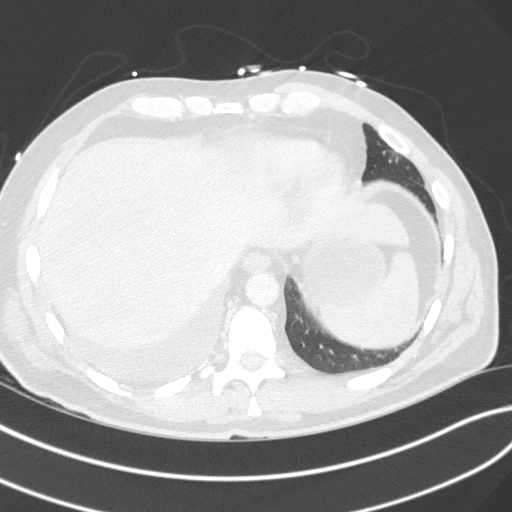
[im 60/156  mediastinal]
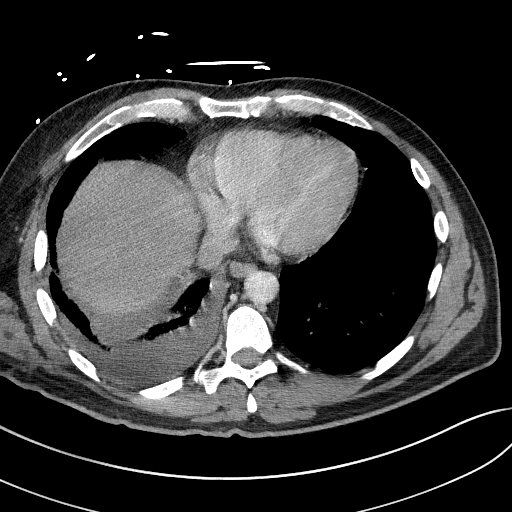
[im 60/156  lung]
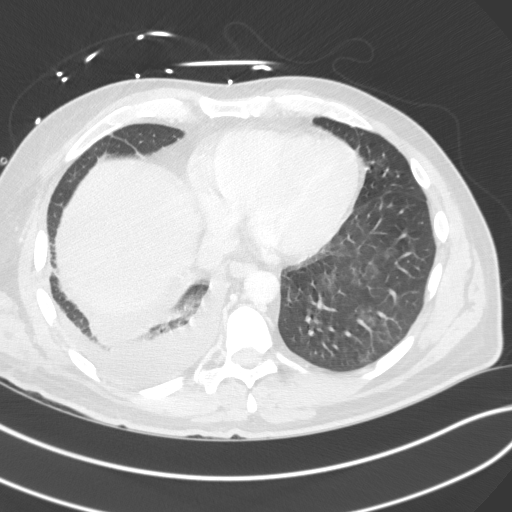
[im 72/156  lung]
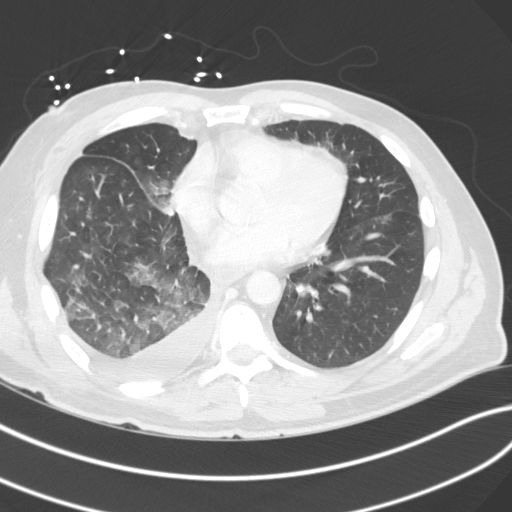
[im 84/156  lung]
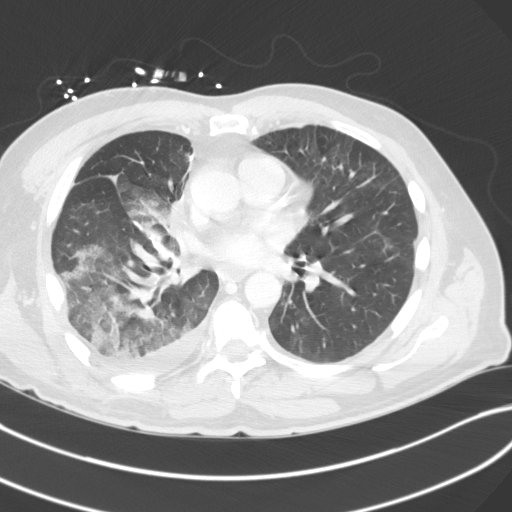
[im 96/156  lung]
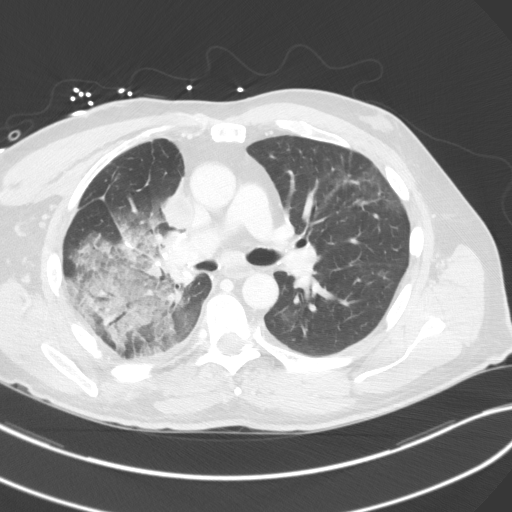
[im 108/156  mediastinal]
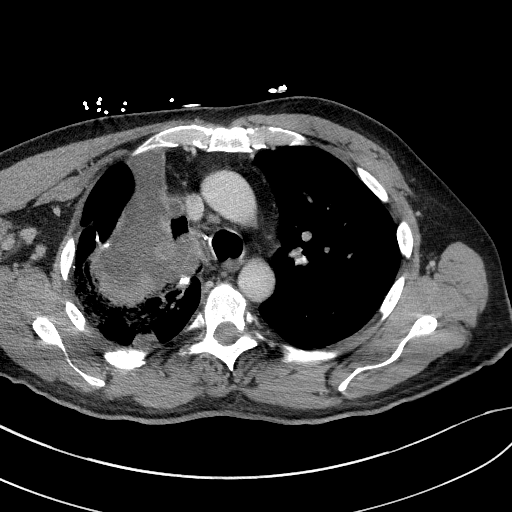
[im 108/156  lung]
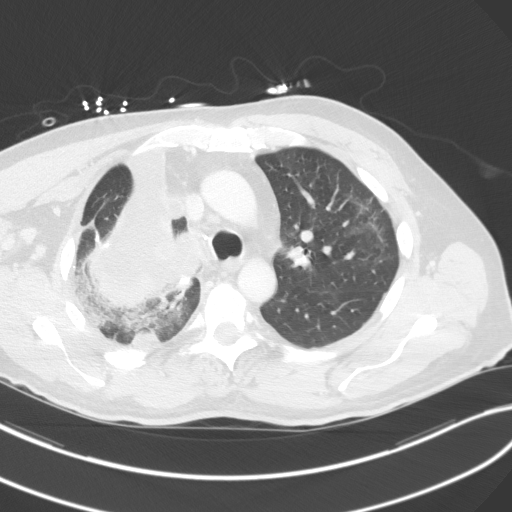
[im 120/156  lung]
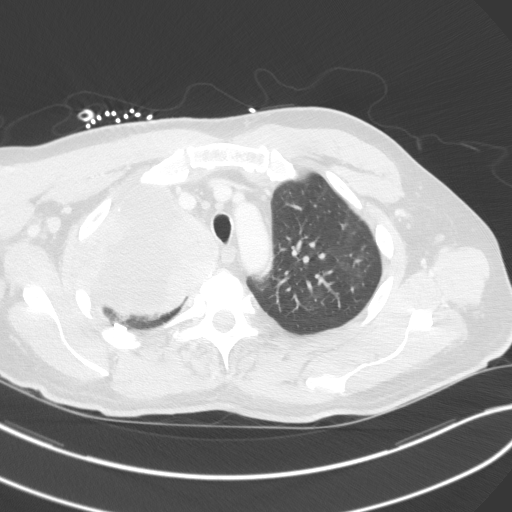
[im 132/156  lung]
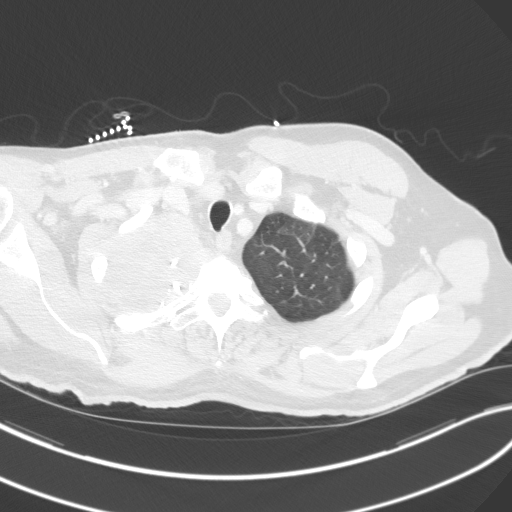
[im 144/156  lung]
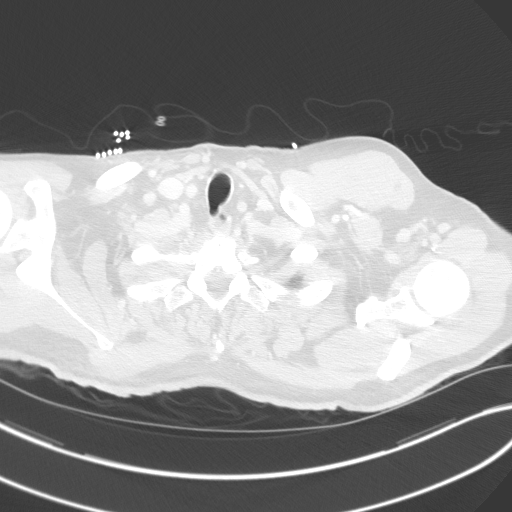

[Series 5: coronal · coronal · 0.62mm/px · 3 of 140 slices shown]
[im 28/140  lung]
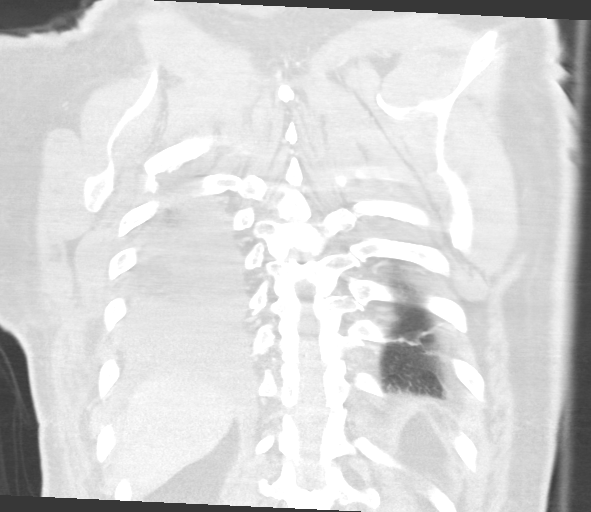
[im 56/140  lung]
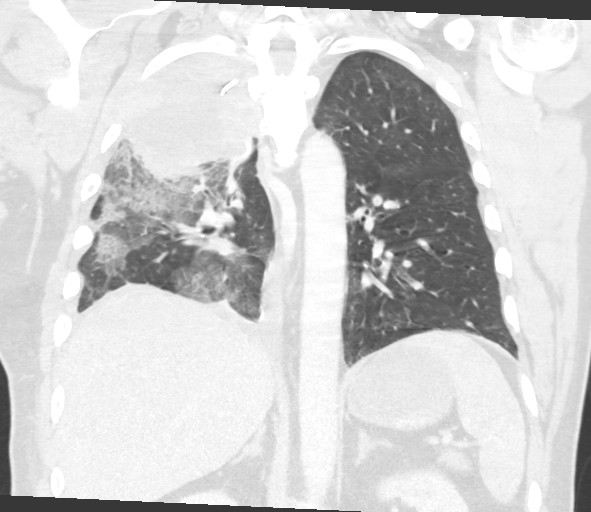
[im 84/140  lung]
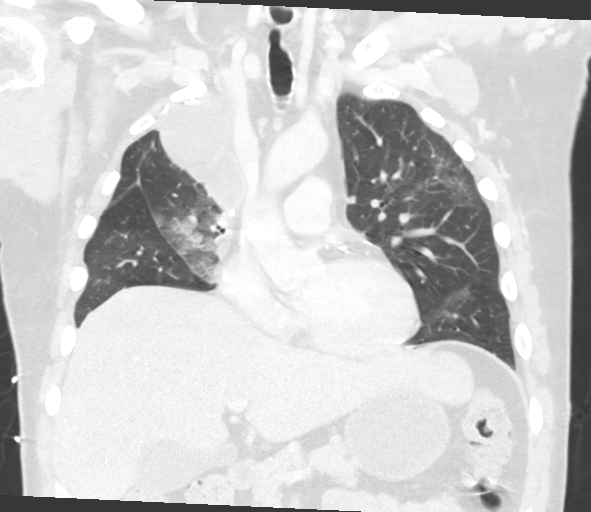

[15 of 36 positions shown; findings below may reference images not displayed]

FINDINGS: Cardiovascular: Normal heart size. No pericardial effusion.
Extensive coronary calcification. No acute vascular finding.

Mediastinum/Nodes: Tubular centrally low-density structure along the
upper right mediastinum is contiguous with a complex right apical
mass and collection. There is right paratracheal nodule measuring 17
mm in diameter, suspicious for metastatic node.

Lungs/Pleura: History of right upper lobectomy for cancer. There is
a recently drained and re-accumulated loculated collection at the
right apex lined by thick masslike nodularity, up to 10 cm on axial
slices. Small right pleural effusion at the base which appears more
simple and dependent. Airspace opacity in the right lung and to a
much lesser extent in the left lung. No discrete cavity or airway
mass.

Upper Abdomen: Negative

Musculoskeletal: The right apical mass is in close continuity with
the upper ribs but no erosion is detected. No hematogenous osseous
metastatic disease noted either.
IMPRESSION: 1. Airspace disease likely reflecting alveolar hemorrhage which
localizes to the right lung.
2. The recurrent right apical fluid collection is lined by masslike
nodularity most consistent with recurrence. Recommend histologic
correlation.
3. Right paratracheal adenopathy.
4. Small right pleural effusion.

## 2021-03-18 IMAGING — DX DG CHEST 1V PORT
1 series · 1 of 1 positions shown · non-contrast
Comparison: [DATE]

CLINICAL DATA: Hemoptysis for several hours, history of known lung
carcinoma

EXAM:
PORTABLE CHEST 1 VIEW

[chest]
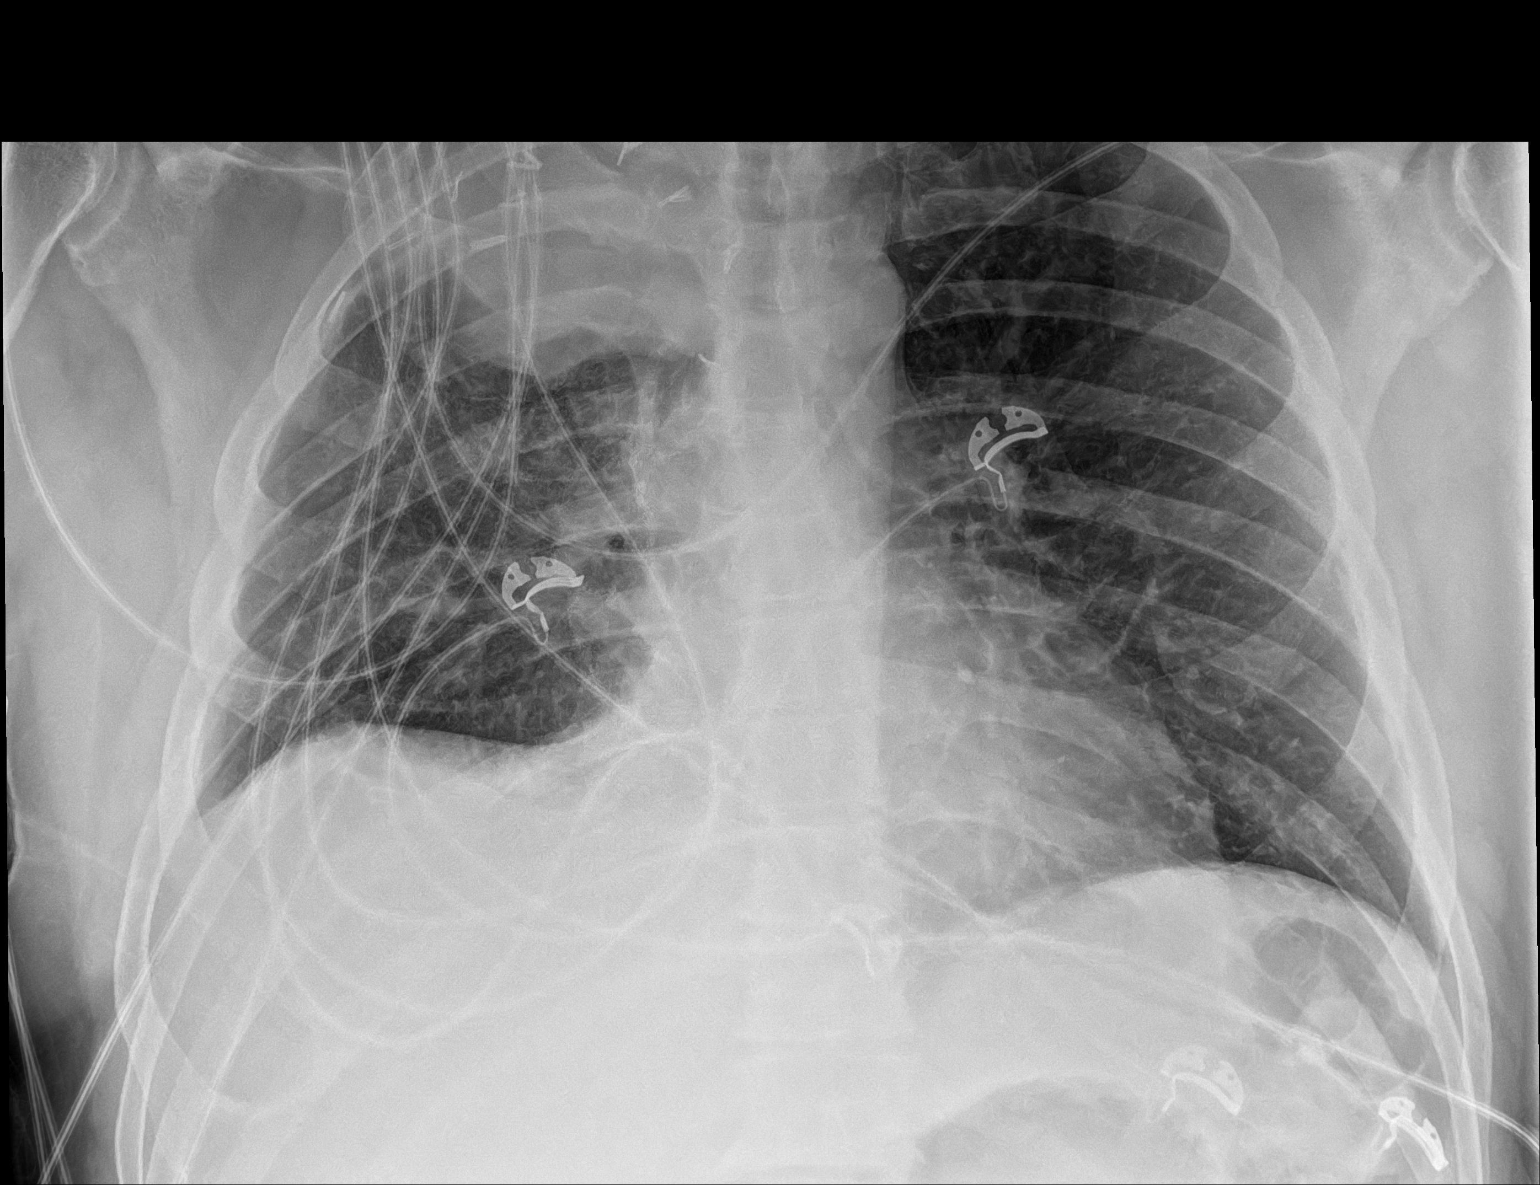

[1 of 1 positions shown; findings below may reference images not displayed]

FINDINGS: Cardiac shadow is within normal limits. Postsurgical changes are
noted in the right apex with persistent soft tissue density in the
apex stable from the prior exam. The previously seen right mid lung
airspace opacity has resolved in the interval. Left lung remains
clear. No bony abnormality is noted.
IMPRESSION: Clearing of previously seen right mid lung airspace opacity.

Remainder of the exam is stable from the prior study.

## 2021-03-18 MED ORDER — ACETAMINOPHEN 500 MG PO TABS
1000.0000 mg | ORAL_TABLET | Freq: Three times a day (TID) | ORAL | Status: DC | PRN
  Administered 2021-03-18 – 2021-04-02 (×2): 1000 mg via ORAL
  Filled 2021-03-18 (×2): qty 2

## 2021-03-18 MED ORDER — VANCOMYCIN HCL 1500 MG/300ML IV SOLN
1500.0000 mg | Freq: Once | INTRAVENOUS | Status: AC
  Administered 2021-03-18: 1500 mg via INTRAVENOUS
  Filled 2021-03-18: qty 300

## 2021-03-18 MED ORDER — TRANEXAMIC ACID FOR INHALATION
500.0000 mg | Freq: Once | RESPIRATORY_TRACT | Status: DC
  Filled 2021-03-18 (×3): qty 10

## 2021-03-18 MED ORDER — FENTANYL CITRATE (PF) 100 MCG/2ML IJ SOLN
100.0000 ug | INTRAMUSCULAR | Status: DC | PRN
  Administered 2021-03-18 (×11): 100 ug via INTRAVENOUS
  Filled 2021-03-18 (×13): qty 2

## 2021-03-18 MED ORDER — IPRATROPIUM-ALBUTEROL 0.5-2.5 (3) MG/3ML IN SOLN
3.0000 mL | Freq: Once | RESPIRATORY_TRACT | Status: AC
  Administered 2021-03-18: 3 mL via RESPIRATORY_TRACT
  Filled 2021-03-18: qty 3

## 2021-03-18 MED ORDER — SODIUM CHLORIDE 0.9 % IV SOLN
500.0000 mg | INTRAVENOUS | Status: DC
  Administered 2021-03-18: 500 mg via INTRAVENOUS
  Filled 2021-03-18 (×2): qty 500

## 2021-03-18 MED ORDER — VANCOMYCIN HCL 1250 MG/250ML IV SOLN
1250.0000 mg | Freq: Two times a day (BID) | INTRAVENOUS | Status: DC
  Administered 2021-03-19 – 2021-03-20 (×3): 1250 mg via INTRAVENOUS
  Filled 2021-03-18 (×3): qty 250

## 2021-03-18 MED ORDER — QUETIAPINE FUMARATE 100 MG PO TABS
100.0000 mg | ORAL_TABLET | Freq: Every day | ORAL | Status: DC
  Administered 2021-03-18 – 2021-04-09 (×23): 100 mg via ORAL
  Filled 2021-03-18 (×23): qty 1

## 2021-03-18 MED ORDER — ONDANSETRON HCL 4 MG/2ML IJ SOLN
4.0000 mg | Freq: Once | INTRAMUSCULAR | Status: AC
  Administered 2021-03-18: 4 mg via INTRAVENOUS
  Filled 2021-03-18: qty 2

## 2021-03-18 MED ORDER — PENICILLIN V POTASSIUM 250 MG PO TABS
500.0000 mg | ORAL_TABLET | Freq: Four times a day (QID) | ORAL | Status: DC
  Administered 2021-03-18: 500 mg via ORAL
  Filled 2021-03-18 (×2): qty 2

## 2021-03-18 MED ORDER — ONDANSETRON HCL 4 MG PO TABS: 4.0000 mg | ORAL_TABLET | Freq: Four times a day (QID) | ORAL | Status: DC | PRN

## 2021-03-18 MED ORDER — POLYETHYLENE GLYCOL 3350 17 G PO PACK: 17.0000 g | PACK | Freq: Every day | ORAL | Status: DC | PRN

## 2021-03-18 MED ORDER — AMLODIPINE BESYLATE 10 MG PO TABS
10.0000 mg | ORAL_TABLET | Freq: Every day | ORAL | Status: DC
  Administered 2021-03-19 – 2021-04-01 (×14): 10 mg via ORAL
  Filled 2021-03-18 (×15): qty 1

## 2021-03-18 MED ORDER — FENTANYL CITRATE (PF) 100 MCG/2ML IJ SOLN
100.0000 ug | Freq: Once | INTRAMUSCULAR | Status: AC
  Administered 2021-03-18: 100 ug via INTRAVENOUS
  Filled 2021-03-18: qty 2

## 2021-03-18 MED ORDER — HYDROMORPHONE HCL 2 MG PO TABS
4.0000 mg | ORAL_TABLET | ORAL | Status: DC | PRN
  Administered 2021-03-18: 4 mg via ORAL
  Filled 2021-03-18: qty 2

## 2021-03-18 MED ORDER — IOHEXOL 300 MG/ML  SOLN
75.0000 mL | Freq: Once | INTRAMUSCULAR | Status: AC | PRN
  Administered 2021-03-18: 75 mL via INTRAVENOUS

## 2021-03-18 MED ORDER — DIPHENHYDRAMINE HCL 50 MG/ML IJ SOLN: 12.5000 mg | Freq: Four times a day (QID) | INTRAMUSCULAR | Status: DC | PRN

## 2021-03-18 MED ORDER — GABAPENTIN 300 MG PO CAPS
300.0000 mg | ORAL_CAPSULE | Freq: Two times a day (BID) | ORAL | Status: DC
  Administered 2021-03-19 – 2021-04-10 (×44): 300 mg via ORAL
  Filled 2021-03-18 (×46): qty 1

## 2021-03-18 MED ORDER — HYDROMORPHONE HCL 2 MG PO TABS: 4.0000 mg | ORAL_TABLET | ORAL | Status: DC | PRN

## 2021-03-18 MED ORDER — ALBUTEROL SULFATE HFA 108 (90 BASE) MCG/ACT IN AERS
2.0000 | INHALATION_SPRAY | RESPIRATORY_TRACT | Status: DC | PRN
  Administered 2021-03-18: 2 via RESPIRATORY_TRACT
  Filled 2021-03-18: qty 6.7

## 2021-03-18 MED ORDER — LIDOCAINE 5 % EX PTCH
1.0000 | MEDICATED_PATCH | CUTANEOUS | Status: DC
  Administered 2021-03-18 – 2021-04-09 (×20): 1 via TRANSDERMAL
  Filled 2021-03-18 (×25): qty 1

## 2021-03-18 MED ORDER — SODIUM CHLORIDE 0.9% FLUSH: 9.0000 mL | INTRAVENOUS | Status: DC | PRN

## 2021-03-18 MED ORDER — ONDANSETRON HCL 4 MG/2ML IJ SOLN: 4.0000 mg | Freq: Four times a day (QID) | INTRAMUSCULAR | Status: DC | PRN

## 2021-03-18 MED ORDER — PREDNISONE 20 MG PO TABS: 20.0000 mg | ORAL_TABLET | Freq: Every day | ORAL | Status: DC

## 2021-03-18 MED ORDER — NALOXONE HCL 0.4 MG/ML IJ SOLN: 0.4000 mg | INTRAMUSCULAR | Status: DC | PRN

## 2021-03-18 MED ORDER — IPRATROPIUM-ALBUTEROL 0.5-2.5 (3) MG/3ML IN SOLN
3.0000 mL | RESPIRATORY_TRACT | Status: DC | PRN
  Administered 2021-03-18 – 2021-03-19 (×5): 3 mL via RESPIRATORY_TRACT
  Filled 2021-03-18 (×5): qty 3

## 2021-03-18 MED ORDER — DIPHENHYDRAMINE HCL 12.5 MG/5ML PO ELIX: 12.5000 mg | ORAL_SOLUTION | Freq: Four times a day (QID) | ORAL | Status: DC | PRN

## 2021-03-18 MED ORDER — PANTOPRAZOLE SODIUM 40 MG PO TBEC
40.0000 mg | DELAYED_RELEASE_TABLET | Freq: Every day | ORAL | Status: DC
  Administered 2021-03-18 – 2021-04-10 (×24): 40 mg via ORAL
  Filled 2021-03-18 (×24): qty 1

## 2021-03-18 MED ORDER — SERTRALINE HCL 100 MG PO TABS
100.0000 mg | ORAL_TABLET | Freq: Every day | ORAL | Status: DC
  Administered 2021-03-18 – 2021-04-10 (×24): 100 mg via ORAL
  Filled 2021-03-18 (×24): qty 1

## 2021-03-18 MED ORDER — IRBESARTAN 300 MG PO TABS
300.0000 mg | ORAL_TABLET | Freq: Every day | ORAL | Status: DC
  Administered 2021-03-19 – 2021-04-03 (×15): 300 mg via ORAL
  Filled 2021-03-18 (×17): qty 1

## 2021-03-18 MED ORDER — SODIUM CHLORIDE 0.9% FLUSH
3.0000 mL | Freq: Two times a day (BID) | INTRAVENOUS | Status: DC
  Administered 2021-03-18 – 2021-04-01 (×26): 3 mL via INTRAVENOUS
  Administered 2021-04-02: 10 mL via INTRAVENOUS
  Administered 2021-04-03 – 2021-04-10 (×15): 3 mL via INTRAVENOUS

## 2021-03-18 MED ORDER — HYDROMORPHONE HCL 1 MG/ML IJ SOLN
1.0000 mg | Freq: Once | INTRAMUSCULAR | Status: AC
  Administered 2021-03-18: 1 mg via INTRAVENOUS
  Filled 2021-03-18: qty 1

## 2021-03-18 MED ORDER — LATANOPROST 0.005 % OP SOLN
1.0000 [drp] | Freq: Every day | OPHTHALMIC | Status: DC
  Administered 2021-03-18 – 2021-04-09 (×22): 1 [drp] via OPHTHALMIC
  Filled 2021-03-18 (×4): qty 2.5

## 2021-03-18 MED ORDER — PREDNISONE 10 MG PO TABS
10.0000 mg | ORAL_TABLET | Freq: Every day | ORAL | Status: DC
  Administered 2021-03-18 – 2021-03-22 (×4): 10 mg via ORAL
  Filled 2021-03-18 (×5): qty 1

## 2021-03-18 MED ORDER — GABAPENTIN 300 MG PO CAPS
600.0000 mg | ORAL_CAPSULE | Freq: Every day | ORAL | Status: DC
  Administered 2021-03-18 – 2021-04-09 (×23): 600 mg via ORAL
  Filled 2021-03-18 (×23): qty 2

## 2021-03-18 MED ORDER — SODIUM CHLORIDE 0.9 % IV SOLN
2.0000 g | Freq: Three times a day (TID) | INTRAVENOUS | Status: AC
  Administered 2021-03-19 – 2021-03-25 (×21): 2 g via INTRAVENOUS
  Filled 2021-03-18 (×24): qty 2

## 2021-03-18 MED ORDER — HYDROMORPHONE 1 MG/ML IV SOLN
INTRAVENOUS | Status: DC
  Administered 2021-03-18 – 2021-03-19 (×2): 30 mg via INTRAVENOUS
  Filled 2021-03-18 (×3): qty 30

## 2021-03-18 NOTE — Progress Notes (Signed)
RN gave patient nebulizer treament

## 2021-03-18 NOTE — Progress Notes (Signed)
Called  to patients room for neb tx. Pt.is in severe pain which caused him to be SOB.  Pt.is now receiving pain med from RN.

## 2021-03-18 NOTE — Progress Notes (Signed)
Pharmacy Antibiotic Note  Christopher Carter is a 60 y.o. male admitted on 03/18/2021 with HCAP in the setting of abscess left lower tooth.  Pharmacy has been consulted for cefepime and vancomycin dosing.  Patient with worsening cough, hemoptysis and chest/back pain. CT chest with R side airspace disease likely reflecting alveolar hemorrhage, recurrent R apical fluid collection, small right pleural effusion, and right para tracheal adenopathy. Planning chest tube. WBC 18, Tmax 100.8 on 7/7. MRSA swab negative 6/22 but will continue vancomycin for now and follow up cultures given recently post-op.   7/10 Vancomycin 1250mg  Q 12 hr Scr used: 0.8 mg/dL (rounded up for low SCr) Weight: 79.4 kg Vd coeff: 0.72 L/kg Est AUC: 482  Plan: Stop PTA penicillin, ok by MD Vancomycin 1500mg  x1 then 1250mg  q12hr  Cefepime 2g q8 hr Monitor cultures, clinical status, renal fx, vanc levels Narrow abx as able and f/u duration    Height: 5\' 10"  (177.8 cm) Weight: 79.4 kg (175 lb) IBW/kg (Calculated) : 73  Temp (24hrs), Avg:97.9 F (36.6 C), Min:97.1 F (36.2 C), Max:98.7 F (37.1 C)  Recent Labs  Lab 03/15/21 1254 03/18/21 0246  WBC 30.0* 18.1*  CREATININE 0.68 0.50*  LATICACIDVEN 1.7  --     Estimated Creatinine Clearance: 103.9 mL/min (A) (by C-G formula based on SCr of 0.5 mg/dL (L)).    Allergies  Allergen Reactions   Oxycodone Hcl Other (See Comments)    Pt reported "seeing things" and " feeling unusual."   Bupropion Nausea And Vomiting    Antimicrobials this admission: vanc 7/10 >>  cefepime 7/10 >>  Azithro 7/10>>  PCN 7/6>>7/10    Dose adjustments this admission:   Microbiology results: 7/10 Sputum: pending  6/22 MRSA PCR: negative   Thank you for allowing pharmacy to be a part of this patient's care.  Benetta Spar, PharmD, BCPS, BCCP Clinical Pharmacist  Please check AMION for all [place] phone numbers After 10:00 PM, call [place] [phone]

## 2021-03-18 NOTE — Consult Note (Signed)
NAME:  Christopher Carter, MRN:  [id number], DOB:  1961/07/27, LOS: 0 ADMISSION DATE:  03/18/2021, CONSULTATION DATE:  03/18/2021 REFERRING MD:  Dr. Tamala Julian, CHIEF COMPLAINT:  Hemoptysis    History of Present Illness:  Christopher Carter is a 60 y.o. 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 Patient has been treated at this facility for similar presentation and underwent video bronchoscopy and IR placed chest tube for pleural effusion with Dr. Roxan Hockey. He was discharged in stable condition with plans to follow up with cardiothoracic surgery and oncology.  He presented this admission with recurrent hemoptysis that started 4hr prior to admission with associated right upper back pain that radiates to upper neck. Patient was transferred from [place] to [facility] for cardiothoracic, pulmonary, and IR consults. Vital signs stable, WBC elevated at 18.1. CT chest was obtained and consistent with alveolar hemorrhage localized to the right with right apical fluid collection.   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 rate 18, height 5\' 10"  (1.778 m), weight 79.4 kg, SpO2 96 %.        Intake/Output Summary (Last 24 hours) at 03/18/2021 1529 Last data filed at 03/18/2021 1246 Gross per 24 hour  Intake 480 ml  Output 400 ml  Net 80 ml   Filed Weights   03/18/21 0240 03/18/21 0242  Weight: 78.9 kg 79.4 kg    Examination: Deferred to  MD  Resolved Hospital Problem list     Assessment & Plan:  Acute on chronic hypoxic respiratory failure  -On 5L  at baseline  Recurrent hemoptysis  Right upper lobe pleural effusion Stage IIIa lung adenocarcinoma now s/p right upper lobectomy  -Has not started adjunctive therapy  Intractable pain  -Likely cancer related with possible nerve involvement  P: Cardiothoracic surgery consult Inhaled TXA IR to place chest tube for pleural effusion  Pleural effusion  Up-titration of pain meds  Palliative care consult  Monitor amount of hemoptysis  Close observation of airway protection  Obtain sputum culture  Check Procal   Best Practice   Diet/type: Regular consistency (see orders) DVT prophylaxis: SCD GI prophylaxis: N/A Lines: N/A Foley:  N/A Code Status:  full code Last date of multidisciplinary goals of care discussion: Per primary   Labs   CBC: Recent Labs  Lab 03/15/21 1254 03/18/21 0246  WBC 30.0* 18.1*  NEUTROABS 25.5* 13.2*  HGB 11.5* 10.8*  HCT 35.7* 33.1*  MCV 93.9 90.9  PLT 574* 405*    Basic Metabolic Panel: Recent Labs  Lab 03/15/21 1254 03/18/21 0246  NA 133* 134*  K 4.5 4.0  CL 96* 98  CO2 27 26  GLUCOSE 164* 172*  BUN 7 13  CREATININE 0.68 0.50*  CALCIUM 9.3 9.0   GFR: Estimated Creatinine Clearance: 103.9 mL/min (A) (by C-G formula based on SCr of 0.5 mg/dL (L)). Recent Labs  Lab 03/15/21 1254 03/18/21 0246  WBC 30.0* 18.1*  LATICACIDVEN 1.7  --     Liver Function Tests:  Recent Labs  Lab 03/15/21 1254  AST 12*  ALT 36  ALKPHOS 94  BILITOT 0.8  PROT 6.7  ALBUMIN 3.1*   No results for input(s): LIPASE, AMYLASE in the last 168 hours. No results for input(s): AMMONIA in the last 168 hours.  ABG    Component Value Date/Time   PHART 7.458 (H) 02/26/2021 0630   PCO2ART 40.5 02/26/2021 0630   PO2ART 79 (L) 02/26/2021 0630   HCO3 28.7 (H) 02/26/2021 0630   TCO2 30 02/26/2021 0630   ACIDBASEDEF 1.3 01/18/2021 1504    O2SAT 96.0 02/26/2021 0630     Coagulation Profile: Recent Labs  Lab 03/18/21 0246  INR 1.0    Cardiac Enzymes: No results for input(s): CKTOTAL, CKMB, CKMBINDEX, TROPONINI in the last 168 hours.  HbA1C: No results found for: HGBA1C  CBG: No results for input(s): GLUCAP in the last 168 hours.  Review of Systems:   Please see the history of present illness. All other systems reviewed and are negative   Past Medical History:  He,  has a past medical history of Anxiety, Cancer ([place]) (12/2020), Depression, Hypertension, and Pneumonia.   Surgical History:   Past Surgical History:  Procedure Laterality Date   ANKLE FRACTURE SURGERY Right    BRONCHIAL BIOPSY  01/05/2021   Procedure: BRONCHIAL BIOPSIES;  Surgeon: Collene Gobble, MD;  Location: [place];  Service: Cardiopulmonary;;   BRONCHIAL BRUSHINGS  01/05/2021   Procedure: BRONCHIAL BRUSHINGS;  Surgeon: Collene Gobble, MD;  Location: [place];  Service: Cardiopulmonary;;   BRONCHIAL NEEDLE ASPIRATION BIOPSY  01/05/2021   Procedure: BRONCHIAL NEEDLE ASPIRATION BIOPSIES;  Surgeon: Collene Gobble, MD;  Location: [place];  Service: Cardiopulmonary;;   COLON SURGERY  2017   bowel obstruction surgery per pt    ENDOBRONCHIAL ULTRASOUND N/A 01/05/2021   Procedure: ENDOBRONCHIAL ULTRASOUND;  Surgeon: Collene Gobble, MD;  Location: [place];  Service: Cardiopulmonary;  Laterality: N/A;   INTERCOSTAL NERVE BLOCK Right 01/22/2021   Procedure: INTERCOSTAL NERVE BLOCK;  Surgeon: [name], MD;  Location: [place];  Service: Thoracic;  Laterality: Right;   LAPAROSCOPY N/A 04/25/2017   Procedure: LAPAROSCOPY ASSISTED ILEO-CECECTOMY WITH SMALL BOWEL RESECTION WITH ANASTOMOSIS;  Surgeon: Excell Seltzer, MD;  Location: [facility];  Service: General;  Laterality: N/A;   LYMPH NODE DISSECTION Right 01/22/2021   Procedure: LYMPH NODE DISSECTION;  Surgeon: [name], MD;  Location: [place];  Service: Thoracic;   Laterality: Right;   THORACOTOMY Right 01/22/2021   Procedure: THORACOTOMY;  Surgeon: [name], MD;  Location: [place];  Service: Thoracic;  Laterality: Right;   VIDEO ASSISTED THORACOSCOPY (VATS)/ LOBECTOMY Right 01/22/2021   Procedure: VIDEO ASSISTED THORACOSCOPY (VATS)/RIGHT UPPER LOBECTOMY;  Surgeon: [name], MD;  Location: [place];  Service: Thoracic;  Laterality: Right;   VIDEO BRONCHOSCOPY N/A 01/05/2021   Procedure: VIDEO BRONCHOSCOPY WITH FLUORO;  Surgeon: Collene Gobble, MD;  Location: [place];  Service: Cardiopulmonary;  Laterality: N/A;     Social History:   reports that he has never smoked. He has never used smokeless tobacco. He reports current alcohol use of about 22.0 standard drinks of alcohol per week. He reports that he does not use drugs.   Family History:  His family history is not on file.   Allergies Allergies  Allergen Reactions   Oxycodone Hcl Other (See Comments)    Pt reported "seeing things" and " feeling unusual."   Bupropion Nausea And Vomiting  Home Medications  Prior to Admission medications   Medication Sig Start Date End Date Taking? Authorizing Provider  acetaminophen (TYLENOL) 500 MG tablet Take 1,000 mg by mouth every 8 (eight) hours as needed for moderate pain or headache.   Yes [provider]  amLODipine (NORVASC) 10 MG tablet TAKE 1 TABLET BY MOUTH EVERY DAY Patient taking differently: Take 10 mg by mouth daily. 02/19/21  Yes [name], MD  Ascorbic Acid (VITAMIN C) 1000 MG tablet Take 1,000 mg by mouth daily.   Yes [provider]  gabapentin (NEURONTIN) 300 MG capsule Take 300-600 mg by mouth See admin instructions. Takes 300 mg in the morning and afternoon and 600 mg at night 03/12/21  Yes [provider]  HYDROmorphone (DILAUDID) 2 MG tablet Take 1-2 tablets (2-4 mg total) by mouth every 4 (four) hours as needed for severe pain. Patient taking differently: Take 4 mg by mouth  every 4 (four) hours as needed for severe pain. 03/13/21  Yes Conte, Tessa N, PA-C  irbesartan (AVAPRO) 300 MG tablet Take 300 mg by mouth daily.   Yes [provider]  latanoprost (XALATAN) 0.005 % ophthalmic solution Place 1 drop into both eyes at bedtime.   Yes [provider]  lidocaine (LIDODERM) 5 % Place 1 patch onto the skin daily. Remove & Discard patch within 12 hours or as directed by MD 03/07/21  Yes Regalado, Belkys A, MD  pantoprazole (PROTONIX) 40 MG tablet Take 1 tablet (40 mg total) by mouth daily. 03/08/21  Yes Regalado, Belkys A, MD  penicillin v potassium (VEETID) 500 MG tablet Take 500 mg by mouth 4 (four) times daily. Start date : 03/14/21 03/14/21  Yes [provider]  polyethylene glycol (MIRALAX / GLYCOLAX) 17 g packet Take 17 g by mouth daily as needed for mild constipation. 03/07/21  Yes Regalado, Belkys A, MD  potassium chloride SA (KLOR-CON) 20 MEQ tablet Take 1 tablet (20 mEq total) by mouth daily. 03/07/21  Yes Regalado, Belkys A, MD  predniSONE (DELTASONE) 20 MG tablet Take 40 mg daily for 5 days, then 20 mg daily for 5 days then 10 mg daily for 5 days then stop. Patient taking differently: Take 20 mg by mouth daily with breakfast. Take 40 mg daily for 5 days, then 20 mg daily for 5 days then 10 mg daily for 5 days then stop. 03/07/21  Yes Regalado, Belkys A, MD  QUEtiapine (SEROQUEL) 50 MG tablet Take 100 mg by mouth at bedtime. 11/06/20  Yes [provider]  sertraline (ZOLOFT) 100 MG tablet Take 100 mg by mouth daily. 10/29/20  Yes [provider]  vitamin B-12 (CYANOCOBALAMIN) 1000 MCG tablet Take 1,000 mcg by mouth daily.   Yes [provider]  furosemide (LASIX) 20 MG tablet Take 1 tablet (20 mg total) by mouth daily. Patient not taking: No sig reported 03/07/21   Elmarie Shiley, MD     Critical care time: N/A  Dak Szumski D. Kenton Kingfisher, NP-C [facility] Personal contact information can be found on  Amion  03/18/2021, 4:28 PM

## 2021-03-18 NOTE — H&P (Signed)
History and Physical    Christopher Carter KNL:[id number] DOB: 1961-01-22 DOA: 03/18/2021  Referring MD/NP/PA: Gean Birchwood, MD PCP: [name], MD  Patient coming from: [facility] transfer  Chief Complaint: Coughing up blood  I have personally briefly reviewed patient's old medical records in [place]   HPI: Christopher Carter is a 60 y.o. 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 Koleen Nimrod on 5/16, hypertension, anxiety, and depression presents with complaints of coughing up blood.  History is obtained with assistance of the patient's wife who is present at bedside.  Records note patient had last been admitted into the hospital on 6/19-6/29 for hemoptysis and shortness of breath.  He was found to have a loculated hemothorax requiring IR placement of a chest tube and empiric antibiotics with prednisone taper.  The chest tube was able to be removed prior to the patient being discharged home.  He had followed up with cardiothoracic surgery as well as radiation oncology earlier this week.  Over the last few days he had been coughing up blood daily.  Yesterday, had developed a persistent cough and was coughing up blood more frequently than previously.  Wife notes that his oxygenation has been stable on 4 L, and they had not noticed any drops.  He had been bumped up to 6 L of nasal cannula oxygen at home as it made him seem more comfortable.  He has been having severe right-sided back pain that radiates down his arm that is severe and rated as a 10 out of 10 on the pain scale.  Despite being on Dilaudid every 4 hours and increasing gabapentin at night he has had no significant relief.  Any kind of movement or coughing seems to worsen symptoms. Patient had been started on penicillin V 500 mg 4 times daily on 7/6 for treatment of an abscessed tooth for which there was plans for a root canal  on 7/11.  ED Course: Upon admission to the emergency department patient was noted to be afebrile, pulse 98-118, respirations 12-24, blood pressures maintained, and O2 saturations 92-97% on 5 L nasal cannula oxygen.  Labs significant for WBC 18.1, hemoglobin 10.8, platelets 405, sodium 134, and glucose 172.  CT of the chest significant for airspace disease in the right lung representing alveolar hemorrhage with recurrence of apical fluid collection, masslike nodular present concerning for recurrence, and right peritracheal adenopathy.  Review of Systems  Constitutional:  Positive for malaise/fatigue.  Respiratory:  Positive for cough and hemoptysis.   Musculoskeletal:  Positive for joint pain and myalgias.   Past Medical History:  Diagnosis Date   Anxiety    Cancer ([place]) 12/2020   Depression    Hypertension    Pneumonia    1990's    Past Surgical History:  Procedure Laterality Date   ANKLE FRACTURE SURGERY Right    BRONCHIAL BIOPSY  01/05/2021   Procedure: BRONCHIAL BIOPSIES;  Surgeon: Collene Gobble, MD;  Location: [place];  Service: Cardiopulmonary;;   BRONCHIAL BRUSHINGS  01/05/2021   Procedure: BRONCHIAL BRUSHINGS;  Surgeon: Collene Gobble, MD;  Location: [place];  Service: Cardiopulmonary;;   BRONCHIAL NEEDLE ASPIRATION BIOPSY  01/05/2021   Procedure: BRONCHIAL NEEDLE ASPIRATION BIOPSIES;  Surgeon: Collene Gobble, MD;  Location: [place];  Service: Cardiopulmonary;;   COLON SURGERY  2017   bowel obstruction surgery per pt    ENDOBRONCHIAL ULTRASOUND N/A 01/05/2021  Procedure: ENDOBRONCHIAL ULTRASOUND;  Surgeon: Collene Gobble, MD;  Location[facility];  Service: Cardiopulmonary;  Laterality: N/A;   INTERCOSTAL NERVE BLOCK Right 01/22/2021   Procedure: INTERCOSTAL NERVE BLOCK;  Surgeon: [name], MD;  Location: [place];  Service: Thoracic;  Laterality: Right;   LAPAROSCOPY N/A 04/25/2017   Procedure: LAPAROSCOPY ASSISTED ILEO-CECECTOMY WITH SMALL BOWEL  RESECTION WITH ANASTOMOSIS;  Surgeon: Excell Seltzer, MD;  Location: [facility];  Service: General;  Laterality: N/A;   LYMPH NODE DISSECTION Right 01/22/2021   Procedure: LYMPH NODE DISSECTION;  Surgeon: [name], MD;  Location: [place];  Service: Thoracic;  Laterality: Right;   THORACOTOMY Right 01/22/2021   Procedure: THORACOTOMY;  Surgeon: [name], MD;  Location: [place];  Service: Thoracic;  Laterality: Right;   VIDEO ASSISTED THORACOSCOPY (VATS)/ LOBECTOMY Right 01/22/2021   Procedure: VIDEO ASSISTED THORACOSCOPY (VATS)/RIGHT UPPER LOBECTOMY;  Surgeon: [name], MD;  Location: [place];  Service: Thoracic;  Laterality: Right;   VIDEO BRONCHOSCOPY N/A 01/05/2021   Procedure: VIDEO BRONCHOSCOPY WITH FLUORO;  Surgeon: Collene Gobble, MD;  Location: [place];  Service: Cardiopulmonary;  Laterality: N/A;     reports that he has never smoked. He has never used smokeless tobacco. He reports current alcohol use of about 22.0 standard drinks of alcohol per week. He reports that he does not use drugs.  Allergies  Allergen Reactions   Oxycodone Hcl Other (See Comments)    Pt reported "seeing things" and " feeling unusual."   Bupropion Nausea And Vomiting    History reviewed. No pertinent family history.  Prior to Admission medications   Medication Sig Start Date End Date Taking? Authorizing Provider  acetaminophen (TYLENOL) 500 MG tablet Take 1,000 mg by mouth every 8 (eight) hours as needed for moderate pain or headache.   Yes [provider]  amLODipine (NORVASC) 10 MG tablet TAKE 1 TABLET BY MOUTH EVERY DAY Patient taking differently: Take 10 mg by mouth daily. 02/19/21  Yes [name], MD  Ascorbic Acid (VITAMIN C) 1000 MG tablet Take 1,000 mg by mouth daily.   Yes [provider]  gabapentin (NEURONTIN) 300 MG capsule Take 300-600 mg by mouth See admin instructions. Takes 300 mg in the morning and afternoon and 600 mg at night  03/12/21  Yes [provider]  HYDROmorphone (DILAUDID) 2 MG tablet Take 1-2 tablets (2-4 mg total) by mouth every 4 (four) hours as needed for severe pain. Patient taking differently: Take 4 mg by mouth every 4 (four) hours as needed for severe pain. 03/13/21  Yes Conte, Tessa N, PA-C  irbesartan (AVAPRO) 300 MG tablet Take 300 mg by mouth daily.   Yes [provider]  latanoprost (XALATAN) 0.005 % ophthalmic solution Place 1 drop into both eyes at bedtime.   Yes [provider]  lidocaine (LIDODERM) 5 % Place 1 patch onto the skin daily. Remove & Discard patch within 12 hours or as directed by MD 03/07/21  Yes Regalado, Belkys A, MD  pantoprazole (PROTONIX) 40 MG tablet Take 1 tablet (40 mg total) by mouth daily. 03/08/21  Yes Regalado, Belkys A, MD  polyethylene glycol (MIRALAX / GLYCOLAX) 17 g packet Take 17 g by mouth daily as needed for mild constipation. 03/07/21  Yes Regalado, Belkys A, MD  potassium chloride SA (KLOR-CON) 20 MEQ tablet Take 1 tablet (20 mEq total) by mouth daily. 03/07/21  Yes Regalado, Belkys A, MD  predniSONE (DELTASONE) 20 MG tablet Take 40 mg  daily for 5 days, then 20 mg daily for 5 days then 10 mg daily for 5 days then stop. Patient taking differently: Take 20 mg by mouth daily with breakfast. Take 40 mg daily for 5 days, then 20 mg daily for 5 days then 10 mg daily for 5 days then stop. 03/07/21  Yes Regalado, Belkys A, MD  QUEtiapine (SEROQUEL) 50 MG tablet Take 100 mg by mouth at bedtime. 11/06/20  Yes [provider]  sertraline (ZOLOFT) 100 MG tablet Take 100 mg by mouth daily. 10/29/20  Yes [provider]  vitamin B-12 (CYANOCOBALAMIN) 1000 MCG tablet Take 1,000 mcg by mouth daily.   Yes [provider]  furosemide (LASIX) 20 MG tablet Take 1 tablet (20 mg total) by mouth daily. Patient not taking: Reported on 03/18/2021 03/07/21   Regalado, Jerald Kief A, MD  penicillin v potassium (VEETID) 500 MG tablet Take by mouth. 03/14/21    [provider]    Physical Exam:  Constitutional: NAD, calm, comfortable Vitals:   03/18/21 1000 03/18/21 1200 03/18/21 1300 03/18/21 1327  BP: 124/78 (!) 147/80 (!) 145/71   Pulse: 90 95 97   Resp: 16 20 18    Temp:      TempSrc:      SpO2: 95% 93% 92% 96%  Weight:      Height:       Eyes: PERRL, lids and conjunctivae normal ENMT: Mucous membranes are moist. Posterior pharynx clear of any exudate or lesions.Normal dentition.  Neck: normal, supple, no masses, no thyromegaly Respiratory: clear to auscultation bilaterally, no wheezing, no crackles. Normal respiratory effort. No accessory muscle use.  Cardiovascular: Regular rate and rhythm, no murmurs / rubs / gallops. No extremity edema. 2+ pedal pulses. No carotid bruits.  Abdomen: no tenderness, no masses palpated. No hepatosplenomegaly. Bowel sounds positive.  Musculoskeletal: no clubbing / cyanosis. No joint deformity upper and lower extremities. Good ROM, no contractures. Normal muscle tone.  Skin: no rashes, lesions, ulcers. No induration Neurologic: CN 2-12 grossly intact. Sensation intact, DTR normal. Strength 5/5 in all 4.  Psychiatric: Normal judgment and insight. Alert and oriented x 3. Normal mood.     Labs on Admission: I have personally reviewed following labs and imaging studies  CBC: Recent Labs  Lab 03/15/21 1254 03/18/21 0246  WBC 30.0* 18.1*  NEUTROABS 25.5* 13.2*  HGB 11.5* 10.8*  HCT 35.7* 33.1*  MCV 93.9 90.9  PLT 574* 235*   Basic Metabolic Panel: Recent Labs  Lab 03/15/21 1254 03/18/21 0246  NA 133* 134*  K 4.5 4.0  CL 96* 98  CO2 27 26  GLUCOSE 164* 172*  BUN 7 13  CREATININE 0.68 0.50*  CALCIUM 9.3 9.0   GFR: Estimated Creatinine Clearance: 103.9 mL/min (A) (by C-G formula based on SCr of 0.5 mg/dL (L)). Liver Function Tests: Recent Labs  Lab 03/15/21 1254  AST 12*  ALT 36  ALKPHOS 94  BILITOT 0.8  PROT 6.7  ALBUMIN 3.1*   No results for input(s): LIPASE,  AMYLASE in the last 168 hours. No results for input(s): AMMONIA in the last 168 hours. Coagulation Profile: Recent Labs  Lab 03/18/21 0246  INR 1.0   Cardiac Enzymes: No results for input(s): CKTOTAL, CKMB, CKMBINDEX, TROPONINI in the last 168 hours. BNP (last 3 results) No results for input(s): PROBNP in the last 8760 hours. HbA1C: No results for input(s): HGBA1C in the last 72 hours. CBG: No results for input(s): GLUCAP in the last 168 hours. Lipid Profile: No  results for input(s): CHOL, HDL, LDLCALC, TRIG, CHOLHDL, LDLDIRECT in the last 72 hours. Thyroid Function Tests: No results for input(s): TSH, T4TOTAL, FREET4, T3FREE, THYROIDAB in the last 72 hours. Anemia Panel: No results for input(s): VITAMINB12, FOLATE, FERRITIN, TIBC, IRON, RETICCTPCT in the last 72 hours. Urine analysis:    Component Value Date/Time   COLORURINE YELLOW 01/18/2021 1500   APPEARANCEUR CLEAR 01/18/2021 1500   LABSPEC 1.006 01/18/2021 1500   PHURINE 6.0 01/18/2021 1500   GLUCOSEU NEGATIVE 01/18/2021 1500   HGBUR SMALL (A) 01/18/2021 1500   BILIRUBINUR NEGATIVE 01/18/2021 1500   KETONESUR NEGATIVE 01/18/2021 1500   PROTEINUR NEGATIVE 01/18/2021 1500   NITRITE NEGATIVE 01/18/2021 1500   LEUKOCYTESUR NEGATIVE 01/18/2021 1500   Sepsis Labs: Recent Results (from the past 240 hour(s))  Resp Panel by RT-PCR (Flu A&B, Covid) Nasopharyngeal Swab     Status: None   Collection Time: 03/18/21  2:57 AM   Specimen: Nasopharyngeal Swab; Nasopharyngeal(NP) swabs in vial transport medium  Result Value Ref Range Status   SARS Coronavirus 2 by RT PCR NEGATIVE NEGATIVE Final    Comment: (NOTE) SARS-CoV-2 target nucleic acids are NOT DETECTED.  The SARS-CoV-2 RNA is generally detectable in upper respiratory specimens during the acute phase of infection. The lowest concentration of SARS-CoV-2 viral copies this assay can detect is 138 copies/mL. A negative result does not preclude SARS-Cov-2 infection and  should not be used as the sole basis for treatment or other patient management decisions. A negative result may occur with  improper specimen collection/handling, submission of specimen other than nasopharyngeal swab, presence of viral mutation(s) within the areas targeted by this assay, and inadequate number of viral copies(<138 copies/mL). A negative result must be combined with clinical observations, patient history, and epidemiological information. The expected result is Negative.  Fact Sheet for Patients:  EntrepreneurPulse.com.au  Fact Sheet for Healthcare Providers:  IncredibleEmployment.be  This test is no t yet approved or cleared by the [place] FDA and  has been authorized for detection and/or diagnosis of SARS-CoV-2 by FDA under an Emergency Use Authorization (EUA). This EUA will remain  in effect (meaning this test can be used) for the duration of the COVID-19 declaration under Section 564(b)(1) of the Act, 21 U.S.C.section 360bbb-3(b)(1), unless the authorization is terminated  or revoked sooner.       Influenza A by PCR NEGATIVE NEGATIVE Final   Influenza B by PCR NEGATIVE NEGATIVE Final    Comment: (NOTE) The Xpert Xpress SARS-CoV-2/FLU/RSV plus assay is intended as an aid in the diagnosis of influenza from Nasopharyngeal swab specimens and should not be used as a sole basis for treatment. Nasal washings and aspirates are unacceptable for Xpert Xpress SARS-CoV-2/FLU/RSV testing.  Fact Sheet for Patients: EntrepreneurPulse.com.au  Fact Sheet for Healthcare Providers: IncredibleEmployment.be  This test is not yet approved or cleared by the [place] FDA and has been authorized for detection and/or diagnosis of SARS-CoV-2 by FDA under an Emergency Use Authorization (EUA). This EUA will remain in effect (meaning this test can be used) for the duration of the COVID-19 declaration  under Section 564(b)(1) of the Act, 21 U.S.C. section 360bbb-3(b)(1), unless the authorization is terminated or revoked.  Performed at [facility], [address], [place], [place] [address]      Radiological Exams on Admission: CT Chest W Contrast  Result Date: 03/18/2021 CLINICAL DATA:  Hemoptysis.  Recent diagnosis of lung cancer EXAM: CT CHEST WITH CONTRAST TECHNIQUE: Multidetector CT imaging of the chest was performed  during intravenous contrast administration. CONTRAST:  60mL OMNIPAQUE IOHEXOL 300 MG/ML  SOLN COMPARISON:  02/26/2021 FINDINGS: Cardiovascular: Normal heart size. No pericardial effusion. Extensive coronary calcification. No acute vascular finding. Mediastinum/Nodes: Tubular centrally low-density structure along the upper right mediastinum is contiguous with a complex right apical mass and collection. There is right paratracheal nodule measuring 17 mm in diameter, suspicious for metastatic node. Lungs/Pleura: History of right upper lobectomy for cancer. There is a recently drained and re-accumulated loculated collection at the right apex lined by thick masslike nodularity, up to 10 cm on axial slices. Small right pleural effusion at the base which appears more simple and dependent. Airspace opacity in the right lung and to a much lesser extent in the left lung. No discrete cavity or airway mass. Upper Abdomen: Negative Musculoskeletal: The right apical mass is in close continuity with the upper ribs but no erosion is detected. No hematogenous osseous metastatic disease noted either. IMPRESSION: 1. 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 2. The recurrent right apical fluid collection is lined by masslike nodularity most consistent with recurrence. Recommend histologic correlation. 3. Right paratracheal adenopathy. 4. Small right pleural effusion. Electronically Signed   By: Monte Fantasia M.D.   On: 03/18/2021 04:49    DG Chest Portable 1 View  Result Date: 03/18/2021 CLINICAL DATA:  Hemoptysis for several hours, history of known lung carcinoma EXAM: PORTABLE CHEST 1 VIEW COMPARISON:  03/15/2021 FINDINGS: Cardiac shadow is within normal limits. Postsurgical changes are noted in the right apex with persistent soft tissue density in the apex stable from the prior exam. The previously seen right mid lung airspace opacity has resolved in the interval. Left lung remains clear. No bony abnormality is noted. IMPRESSION: Clearing of previously seen right mid lung airspace opacity. Remainder of the exam is stable from the prior study. Electronically Signed   By: Inez Catalina M.D.   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  At baseline patient is on 4 L nasal cannula oxygen.  CT imaging concerning for alveolar hemorrhage with recurrence of apical fluid collection and masslike nodularity concerning for reoccurrence.    -Admit to a progressive bed -Continuous pulse oximetry with oxygen to maintain O2 saturation greater than 92% -Aspiration precautions with elevation of head of -IR consult for chest tube placement for drainage with plans to send fluid for cytology and culture -Tranexamic acid nebs ordered by PCCM -N.p.o. after midnight for plans for chest tube placement in a.m. -Appreciate PCCM and thoracic surgery, will follow for any further recommendation  Uncontrolled pain: Patient reports pain had not been well controlled on Dilaudid 4 mg every 4 hours. -Dilaudid PCA pump ordered -Palliative care consulted for pain control  Leukocytosis: Acute.  WBC elevated 18.2.  Suspect multifactorial in nature. -Recheck CBC in a.m.  Abscessed tooth: Patient was placed on penicillin V for treatment  and was supposed to undergo root canal on 7/11. -Continue penicillin V  Normocytic anemia: Chronic.  Hemoglobin 10.8 g/dL which appears to be around patient's baseline. -Type and screen for possible need of blood products -Serial monitoring of H&H  Hypertension: Blood pressures currently maintained.  Home blood pressure medications include amlodipine 10 mg daily and irbesartan 300 mg daily. -Continue home regimen as tolerated  Anxiety and major depression: Patient had made  note of suicidal ideations while in the emergency department prior to being transferred to [facility]. -Suicide precautions -Continue Seroquel and Zoloft -May benefit from a formal psychiatry evaluation prior to discharge  Thrombocytosis: Platelet count 405. -Continue to monitor  GERD -Continue Protonix  DVT prophylaxis: SCDs Code Status: Full Family Communication: Wife updated Disposition Plan: To be determined Consults called: PCCM, palliative care, cardiothoracic surgery, IR Admission status: Inpatient require more than 2 midnight stay  Norval Morton MD [facility]   If 7PM-7AM, please contact night-coverage   03/18/2021, 3:14 PM

## 2021-03-18 NOTE — ED Notes (Signed)
Report called to Mauricia Area, RN unit [id number].

## 2021-03-18 NOTE — Consult Note (Addendum)
[address]       [place] [address]             [phone]        Dewell A Burkman [place] Medical Record #[id number] Date of Birth: 1961/06/21  Referring: Dr. Tamala Julian, MD Primary Care: [name], MD   Chief Complaint:    Chief Complaint  Patient presents with   Hemoptysis   Back Pain    History of Present Illness:     This is a 60 year old male with a past medical history of hypertension, tobacco abuse, depression, left tooth abscess and adenocarcinoma (stage IIIa) s/p right thoracotomy, extra pleural RUL, LN dissection, intercostal nerve block on 05/16,2022 who was last admitted on 02/25/2021 for hemoptysis and shortness of breath. Ultimately, he was placed on antibiotics and Prednisone for pneumonia and IR placed a right chest tube for a loculated hemothorax. He was discharged with home oxygen.  Patient's wife at bedside and most of recent history obtained from her. Patient was seen by PA from [facility] this past Tuesday. He had issues with surgical pain, headache, and a few episodes of hemoptysis. Further follow up with Dr. Kipp Brood and patient's pulmonologist were arranged. On Thursday, he had several episodes of hemoptysis. He presented to [facility] but because of the long wait decided to return home. Because of more hemoptysis and increasing right upper back and neck pain that radiates down his right arm, he presented to ED at [facility].  CXR showed right mid lung airspace opacity resolved. 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 WBC was elevated at 18,100, H and H were 10.8 and 33.1. He was transferred to [facility] for further evaluation and treatment. Dr. Roxy Manns was asked to evaluate for thoracic surgery.  At the time of my exam, patient is on 5 liters oxygen via [place]. He is having a lot of pain in his upper back and neck and asking for pain medication.   Current Activity/ Functional Status: Patient is independent with mobility/ambulation, transfers, ADL's, IADL's.   Zubrod Score: At the time of surgery this patient's most appropriate activity status/level should be described as: []     0    Normal activity, no symptoms [x]     1    Restricted in physical strenuous activity but ambulatory, able to do out light work []     2    Ambulatory and capable of self care, unable to do work activities, up and about more than 50%  Of the time                            []     3    Only limited self care, in bed greater than 50% of waking hours []     4    Completely disabled, no self care, confined to bed or chair []     5    Moribund  Past Medical History:  Diagnosis Date   Anxiety    Cancer ([place]) 12/2020   Depression    Hypertension    Pneumonia    1990's    Past Surgical History:  Procedure Laterality Date   ANKLE FRACTURE SURGERY Right    BRONCHIAL BIOPSY  01/05/2021  Procedure: BRONCHIAL BIOPSIES;  Surgeon: Collene Gobble, MD;  Location: [facility] ENDOSCOPY;  Service: Cardiopulmonary;;   BRONCHIAL BRUSHINGS  01/05/2021   Procedure: BRONCHIAL BRUSHINGS;  Surgeon: Collene Gobble, MD;  Location: [place];  Service: Cardiopulmonary;;   BRONCHIAL NEEDLE ASPIRATION BIOPSY  01/05/2021   Procedure: BRONCHIAL NEEDLE ASPIRATION BIOPSIES;  Surgeon: Collene Gobble, MD;  Location: [place];  Service: Cardiopulmonary;;   COLON SURGERY  2017   bowel obstruction surgery per pt    ENDOBRONCHIAL ULTRASOUND N/A 01/05/2021   Procedure: ENDOBRONCHIAL ULTRASOUND;  Surgeon: Collene Gobble, MD;  Location: [place];  Service: Cardiopulmonary;  Laterality: N/A;   INTERCOSTAL NERVE BLOCK Right 01/22/2021   Procedure: INTERCOSTAL NERVE BLOCK;  Surgeon: [name], MD;  Location: [place];  Service: Thoracic;   Laterality: Right;   LAPAROSCOPY N/A 04/25/2017   Procedure: LAPAROSCOPY ASSISTED ILEO-CECECTOMY WITH SMALL BOWEL RESECTION WITH ANASTOMOSIS;  Surgeon: Excell Seltzer, MD;  Location: [facility];  Service: General;  Laterality: N/A;   LYMPH NODE DISSECTION Right 01/22/2021   Procedure: LYMPH NODE DISSECTION;  Surgeon: [name], MD;  Location: [place];  Service: Thoracic;  Laterality: Right;   THORACOTOMY Right 01/22/2021   Procedure: THORACOTOMY;  Surgeon: [name], MD;  Location: [place];  Service: Thoracic;  Laterality: Right;   VIDEO ASSISTED THORACOSCOPY (VATS)/ LOBECTOMY Right 01/22/2021   Procedure: VIDEO ASSISTED THORACOSCOPY (VATS)/RIGHT UPPER LOBECTOMY;  Surgeon: [name], MD;  Location: [place];  Service: Thoracic;  Laterality: Right;   VIDEO BRONCHOSCOPY N/A 01/05/2021   Procedure: VIDEO BRONCHOSCOPY WITH FLUORO;  Surgeon: Collene Gobble, MD;  Location: [place];  Service: Cardiopulmonary;  Laterality: N/A;    Social History   Tobacco Use  Smoking Status Never  Smokeless Tobacco Never    Social History   Substance and Sexual Activity  Alcohol Use Yes   Alcohol/week: 22.0 standard drinks   Types: 1 Cans of beer, 21 Shots of liquor per week   Comment: 3-4 shots of whiskey a day     Allergies  Allergen Reactions   Oxycodone Hcl Other (See Comments)    Pt reported "seeing things" and " feeling unusual."   Bupropion Nausea And Vomiting    Current Facility-Administered Medications  Medication Dose Route Frequency Provider Last Rate Last Admin   acetaminophen (TYLENOL) tablet 1,000 mg  1,000 mg Oral Q8H PRN Norval Morton, MD       [START ON 03/19/2021] amLODipine (NORVASC) tablet 10 mg  10 mg Oral Daily Smith, Rondell A, MD       fentaNYL (SUBLIMAZE) injection 100 mcg  100 mcg Intravenous Q1H PRN Fuller Plan A, MD   100 mcg at 03/18/21 1459   [START ON 03/19/2021] gabapentin (NEURONTIN) capsule 300 mg  300 mg Oral BID WC Smith, Rondell  A, MD       gabapentin (NEURONTIN) capsule 600 mg  600 mg Oral QHS Smith, Rondell A, MD       HYDROmorphone (DILAUDID) tablet 4 mg  4 mg Oral Q4H PRN Smith, Rondell A, MD       ipratropium-albuterol (DUONEB) 0.5-2.5 (3) MG/3ML nebulizer solution 3 mL  3 mL Nebulization Q4H PRN Tamala Julian, Rondell A, MD   3 mL at 03/18/21 1327   [START ON 03/19/2021] irbesartan (AVAPRO) tablet 300 mg  300 mg Oral Daily Smith, Rondell A, MD       latanoprost (XALATAN) 0.005 % ophthalmic solution 1 drop  1 drop Both Eyes QHS Smith, Rondell A,  MD       lidocaine (LIDODERM) 5 % 1 patch  1 patch Transdermal Q24H Smith, Rondell A, MD       ondansetron (ZOFRAN) tablet 4 mg  4 mg Oral Q6H PRN Fuller Plan A, MD       Or   ondansetron (ZOFRAN) injection 4 mg  4 mg Intravenous Q6H PRN Smith, Rondell A, MD       pantoprazole (PROTONIX) EC tablet 40 mg  40 mg Oral Daily Smith, Rondell A, MD       penicillin v potassium (VEETID) tablet 500 mg  500 mg Oral QID Smith, Rondell A, MD       polyethylene glycol (MIRALAX / GLYCOLAX) packet 17 g  17 g Oral Daily PRN Norval Morton, MD       [START ON 03/19/2021] predniSONE (DELTASONE) tablet 20 mg  20 mg Oral Q breakfast Smith, Rondell A, MD       QUEtiapine (SEROQUEL) tablet 100 mg  100 mg Oral QHS Smith, Rondell A, MD       sertraline (ZOLOFT) tablet 100 mg  100 mg Oral Daily Smith, Rondell A, MD       sodium chloride flush (NS) 0.9 % injection 3 mL  3 mL Intravenous Q12H Smith, Rondell A, MD       tranexamic acid (CYKLOKAPRON) 1000 MG/10ML nebulizer solution 500 mg  500 mg Nebulization Once [name], DO        Medications Prior to Admission  Medication Sig Dispense Refill Last Dose   acetaminophen (TYLENOL) 500 MG tablet Take 1,000 mg by mouth every 8 (eight) hours as needed for moderate pain or headache.   03/18/2021   amLODipine (NORVASC) 10 MG tablet TAKE 1 TABLET BY MOUTH EVERY DAY (Patient taking differently: Take 10 mg by mouth daily.) 30 tablet 1 03/17/2021   Ascorbic  Acid (VITAMIN C) 1000 MG tablet Take 1,000 mg by mouth daily.   03/17/2021   gabapentin (NEURONTIN) 300 MG capsule Take 300-600 mg by mouth See admin instructions. Takes 300 mg in the morning and afternoon and 600 mg at night   03/17/2021   HYDROmorphone (DILAUDID) 2 MG tablet Take 1-2 tablets (2-4 mg total) by mouth every 4 (four) hours as needed for severe pain. (Patient taking differently: Take 4 mg by mouth every 4 (four) hours as needed for severe pain.) 30 tablet 0 03/18/2021   irbesartan (AVAPRO) 300 MG tablet Take 300 mg by mouth daily.   03/17/2021   latanoprost (XALATAN) 0.005 % ophthalmic solution Place 1 drop into both eyes at bedtime.   03/17/2021   lidocaine (LIDODERM) 5 % Place 1 patch onto the skin daily. Remove & Discard patch within 12 hours or as directed by MD 30 patch 0 03/17/2021   pantoprazole (PROTONIX) 40 MG tablet Take 1 tablet (40 mg total) by mouth daily. 30 tablet 0 03/17/2021   penicillin v potassium (VEETID) 500 MG tablet Take 500 mg by mouth 4 (four) times daily. Start date : 03/14/21   03/18/2021   polyethylene glycol (MIRALAX / GLYCOLAX) 17 g packet Take 17 g by mouth daily as needed for mild constipation. 14 each 0 Past Week   potassium chloride SA (KLOR-CON) 20 MEQ tablet Take 1 tablet (20 mEq total) by mouth daily. 3 tablet 0 03/15/2021   predniSONE (DELTASONE) 20 MG tablet Take 40 mg daily for 5 days, then 20 mg daily for 5 days then 10 mg daily for 5 days then stop. (Patient  taking differently: Take 20 mg by mouth daily with breakfast. Take 40 mg daily for 5 days, then 20 mg daily for 5 days then 10 mg daily for 5 days then stop.) 20 tablet 0 03/17/2021   QUEtiapine (SEROQUEL) 50 MG tablet Take 100 mg by mouth at bedtime.   03/17/2021   sertraline (ZOLOFT) 100 MG tablet Take 100 mg by mouth daily.   03/17/2021   vitamin B-12 (CYANOCOBALAMIN) 1000 MCG tablet Take 1,000 mcg by mouth daily.   03/17/2021   furosemide (LASIX) 20 MG tablet Take 1 tablet (20 mg total) by mouth daily. (Patient  not taking: No sig reported) 3 tablet 0 Not Taking    History reviewed. No pertinent family history.   Review of Systems:  Non contributory except those already metnioned in HPI     Physical Exam: BP (!) 145/71   Pulse 97   Temp 98.7 F (37.1 C) (Oral)   Resp 18   Ht 5\' 10"  (1.778 m)   Wt 79.4 kg   SpO2 96%   BMI 25.11 kg/m    General appearance: cooperative and pale Head: Normocephalic, without obvious abnormality, atraumatic Neck: no carotid bruit and supple, symmetrical, trachea midline Resp: Diminished on the right apex clear on the left Cardio: RRR GI: Soft, non tender, bowel sounds present Extremities: No LE edema Neurologic: Grossly intact without focal deficit  Diagnostic Studies & Laboratory data:     Recent Radiology Findings:   CT Chest W Contrast  Result Date: 03/18/2021 CLINICAL DATA:  Hemoptysis.  Recent diagnosis of lung cancer EXAM: CT CHEST WITH CONTRAST TECHNIQUE: Multidetector CT imaging of the chest was performed during intravenous contrast administration. CONTRAST:  52mL OMNIPAQUE IOHEXOL 300 MG/ML  SOLN COMPARISON:  02/26/2021 FINDINGS: Cardiovascular: Normal heart size. No pericardial effusion. Extensive coronary calcification. No acute vascular finding. Mediastinum/Nodes: Tubular centrally low-density structure along the upper right mediastinum is contiguous with a complex right apical mass and collection. There is right paratracheal nodule measuring 17 mm in diameter, suspicious for metastatic node. Lungs/Pleura: History of right upper lobectomy for cancer. There is a recently drained and re-accumulated loculated collection at the right apex lined by thick masslike nodularity, up to 10 cm on axial slices. Small right pleural effusion at the base which appears more simple and dependent. Airspace opacity in the right lung and to a much lesser extent in the left lung. No discrete cavity or airway mass. Upper Abdomen: Negative Musculoskeletal: The right  apical mass is in close continuity with the upper ribs but no erosion is detected. No hematogenous osseous metastatic disease noted either. IMPRESSION: 1. 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 2. The recurrent right apical fluid collection is lined by masslike nodularity most consistent with recurrence. Recommend histologic correlation. 3. Right paratracheal adenopathy. 4. Small right pleural effusion. Electronically Signed   By: Monte Fantasia M.D.   On: 03/18/2021 04:49   DG Chest Portable 1 View  Result Date: 03/18/2021 CLINICAL DATA:  Hemoptysis for several hours, history of known lung carcinoma EXAM: PORTABLE CHEST 1 VIEW COMPARISON:  03/15/2021 FINDINGS: Cardiac shadow is within normal limits. Postsurgical changes are noted in the right apex with persistent soft tissue density in the apex stable from the prior exam. The previously seen right mid lung airspace opacity has resolved in the interval. Left lung remains clear. No bony abnormality is noted. IMPRESSION: Clearing of previously seen right mid lung airspace opacity. Remainder of the exam is stable from the  prior study. Electronically Signed   By: Inez Catalina M.D.   On: 03/18/2021 03:18     I have independently reviewed the above radiologic studies and discussed with the patient   Recent Lab Findings: Lab Results  Component Value Date   WBC 18.1 (H) 03/18/2021   HGB 10.8 (L) 03/18/2021   HCT 33.1 (L) 03/18/2021   PLT 405 (H) 03/18/2021   GLUCOSE 172 (H) 03/18/2021   ALT 36 03/15/2021   AST 12 (L) 03/15/2021   NA 134 (L) 03/18/2021   K 4.0 03/18/2021   CL 98 03/18/2021   CREATININE 0.50 (L) 03/18/2021   BUN 13 03/18/2021   CO2 26 03/18/2021   INR 1.0 03/18/2021   Assessment / Plan:   Recurrent hemoptysis, alveolar hemorrhage-Dr. Roxy Manns recommended IR consult to place a chest tube into right apex (he had this done on 02/26/2021 on his last admission). Thoracic surgeon to evaluate  and provide further recommendations. 2. History of adenocarcinoma (stage IIIa)-s/p right thoracotomy, extrapleural RUL, lymph node dissection, and intercostal nerve block by Dr. Roxan Hockey on 01/22/2021.  3. Abscess left lower tooth-on Penicillin V orally and was supposed to go to dentist in am for a root canal. 4. Normocytic anemia-H and H this am 10.8 and 33.1 5. Thrombocytosis-platelets this am 405,000   I  spent 15 minutes counseling the patient face to face.   Lars Pinks PA-C 03/18/2021 3:52 PM   I have seen and examined the patient and agree with the assessment and plan as outlined above by Lars Pinks.  Patient readmitted w/ recurrent worsening cough productive of fairly copious thin serosanguinous fluid and increased right sided chest and back pain.  WBC remains elevated 18k.  CT reveals recurrent/persistent right apical fluid collection as well as persistent but improved alveolar opacity in right lower lung fields.  I favor repeat CT-guided placement of chest tube into right apical fluid collection ASAP and sending fluid for culture and path.  I also favor resuming broad-spectrum antibiotics.  Will make Dr Roxan Hockey and Dr Kipp Brood aware of patient's readmission.   Rexene Alberts, MD 03/18/2021 5:11 PM

## 2021-03-18 NOTE — Progress Notes (Signed)
Added humidity bottle to patient's 5 liter nasal cannula.  RT will continue to monitor.

## 2021-03-18 NOTE — Progress Notes (Signed)
Radiation Oncology         [phone]) [phone] ________________________________  Name: Christopher Carter MRN: [id number]  Date: 03/13/2021  DOB: October 09, 1960  IR:WERX, Dwyane Luo, MD  Curt Bears, MD     REFERRING PHYSICIAN: Curt Bears, MD   DIAGNOSIS: The encounter diagnosis was Primary adenocarcinoma of upper lobe of right lung [facility]).   HISTORY OF PRESENT ILLNESS::Christopher Carter is a 60 y.o. male who is seen for an initial consultation visit regarding the patient's diagnosis of non-small cell lung cancer.  The patient's work-up led to this diagnosis in April 2022.  Symptoms included cough and some tightness of the chest.  Imaging studies revealed a T4 N0 M0 adenocarcinoma of the right upper lobe.  MRI scan of the brain did not reveal any intracranial disease.  PET scan demonstrated hypermetabolic activity within this tumor without any clear lymphadenopathy.  The patient proceeded to a right upper lobe lobectomy which involved a dissection of the adjacent chest wall due to some involvement there.  It was felt that tumor was likely still present at the margin towards the spine given the extent of the disease in this direction.  The surgery was completed on 01/22/2021.  The patient subsequently has been admitted for sepsis but has been recovering.  Given all of these findings I been asked to see the patient for consideration of postoperative radiation treatment.  He does see medical oncology next week as well.    PREVIOUS RADIATION THERAPY: No   PAST MEDICAL HISTORY:  has a past medical history of Anxiety, Cancer ([place]) (12/2020), Depression, Hypertension, and Pneumonia.     PAST SURGICAL HISTORY: Past Surgical History:  Procedure Laterality Date   ANKLE FRACTURE SURGERY Right    BRONCHIAL BIOPSY  01/05/2021   Procedure: BRONCHIAL BIOPSIES;  Surgeon: Collene Gobble, MD;  Location: [place];  Service: Cardiopulmonary;;   BRONCHIAL BRUSHINGS  01/05/2021   Procedure: BRONCHIAL  BRUSHINGS;  Surgeon: Collene Gobble, MD;  Location: [place];  Service: Cardiopulmonary;;   BRONCHIAL NEEDLE ASPIRATION BIOPSY  01/05/2021   Procedure: BRONCHIAL NEEDLE ASPIRATION BIOPSIES;  Surgeon: Collene Gobble, MD;  Location: [place];  Service: Cardiopulmonary;;   COLON SURGERY  2017   bowel obstruction surgery per pt    ENDOBRONCHIAL ULTRASOUND N/A 01/05/2021   Procedure: ENDOBRONCHIAL ULTRASOUND;  Surgeon: Collene Gobble, MD;  Location: [place];  Service: Cardiopulmonary;  Laterality: N/A;   INTERCOSTAL NERVE BLOCK Right 01/22/2021   Procedure: INTERCOSTAL NERVE BLOCK;  Surgeon: [name], MD;  Location: [place];  Service: Thoracic;  Laterality: Right;   LAPAROSCOPY N/A 04/25/2017   Procedure: LAPAROSCOPY ASSISTED ILEO-CECECTOMY WITH SMALL BOWEL RESECTION WITH ANASTOMOSIS;  Surgeon: Excell Seltzer, MD;  Location: [facility];  Service: General;  Laterality: N/A;   LYMPH NODE DISSECTION Right 01/22/2021   Procedure: LYMPH NODE DISSECTION;  Surgeon: [name], MD;  Location: [place];  Service: Thoracic;  Laterality: Right;   THORACOTOMY Right 01/22/2021   Procedure: THORACOTOMY;  Surgeon: [name], MD;  Location: [place];  Service: Thoracic;  Laterality: Right;   VIDEO ASSISTED THORACOSCOPY (VATS)/ LOBECTOMY Right 01/22/2021   Procedure: VIDEO ASSISTED THORACOSCOPY (VATS)/RIGHT UPPER LOBECTOMY;  Surgeon: [name], MD;  Location: [place];  Service: Thoracic;  Laterality: Right;   VIDEO BRONCHOSCOPY N/A 01/05/2021   Procedure: VIDEO BRONCHOSCOPY WITH FLUORO;  Surgeon: Collene Gobble, MD;  Location: [place];  Service: Cardiopulmonary;  Laterality: N/A;     FAMILY HISTORY: family history  is not on file.   SOCIAL HISTORY:  reports that he has never smoked. He has never used smokeless tobacco. He reports current alcohol use of about 22.0 standard drinks of alcohol per week. He reports that he does not use drugs.   ALLERGIES: Oxycodone  hcl and Bupropion   MEDICATIONS:  No current facility-administered medications for this encounter.   No current outpatient medications on file.   Facility-Administered Medications Ordered in Other Encounters  Medication Dose Route Frequency Provider Last Rate Last Admin   acetaminophen (TYLENOL) tablet 1,000 mg  1,000 mg Oral Q8H PRN Fuller Plan A, MD   1,000 mg at 03/18/21 1630   [START ON 03/19/2021] amLODipine (NORVASC) tablet 10 mg  10 mg Oral Daily Smith, Rondell A, MD       fentaNYL (SUBLIMAZE) injection 100 mcg  100 mcg Intravenous Q1H PRN Fuller Plan A, MD   100 mcg at 03/18/21 1630   [START ON 03/19/2021] gabapentin (NEURONTIN) capsule 300 mg  300 mg Oral BID WC Smith, Rondell A, MD       gabapentin (NEURONTIN) capsule 600 mg  600 mg Oral QHS Smith, Rondell A, MD       HYDROmorphone (DILAUDID) tablet 4-6 mg  4-6 mg Oral Q4H PRN Tamala Julian, Rondell A, MD       ipratropium-albuterol (DUONEB) 0.5-2.5 (3) MG/3ML nebulizer solution 3 mL  3 mL Nebulization Q4H PRN Fuller Plan A, MD   3 mL at 03/18/21 1327   [START ON 03/19/2021] irbesartan (AVAPRO) tablet 300 mg  300 mg Oral Daily Smith, Rondell A, MD       latanoprost (XALATAN) 0.005 % ophthalmic solution 1 drop  1 drop Both Eyes QHS Smith, Rondell A, MD       lidocaine (LIDODERM) 5 % 1 patch  1 patch Transdermal Q24H Smith, Rondell A, MD   1 patch at 03/18/21 1640   ondansetron (ZOFRAN) tablet 4 mg  4 mg Oral Q6H PRN Norval Morton, MD       Or   ondansetron (ZOFRAN) injection 4 mg  4 mg Intravenous Q6H PRN Fuller Plan A, MD       pantoprazole (PROTONIX) EC tablet 40 mg  40 mg Oral Daily Tamala Julian, Rondell A, MD   40 mg at 03/18/21 1630   penicillin v potassium (VEETID) tablet 500 mg  500 mg Oral QID Fuller Plan A, MD   500 mg at 03/18/21 1631   polyethylene glycol (MIRALAX / GLYCOLAX) packet 17 g  17 g Oral Daily PRN Norval Morton, MD       [START ON 03/19/2021] predniSONE (DELTASONE) tablet 20 mg  20 mg Oral Q breakfast Smith,  Rondell A, MD       QUEtiapine (SEROQUEL) tablet 100 mg  100 mg Oral QHS Smith, Rondell A, MD       sertraline (ZOLOFT) tablet 100 mg  100 mg Oral Daily Tamala Julian, Rondell A, MD   100 mg at 03/18/21 1631   sodium chloride flush (NS) 0.9 % injection 3 mL  3 mL Intravenous Q12H Smith, Rondell A, MD       tranexamic acid (CYKLOKAPRON) 1000 MG/10ML nebulizer solution 500 mg  500 mg Nebulization Once Noemi Chapel P, DO         REVIEW OF SYSTEMS:  A 15 point review of systems is documented in the electronic medical record. This was obtained by the nursing staff. However, I reviewed this with the patient to discuss relevant findings and make appropriate changes.  Pertinent items are noted in HPI.    PHYSICAL EXAM:  height is 5\' 10"  (1.778 m) and weight is 183 lb (83 kg).   ECOG = 1  0 - Asymptomatic (Fully active, able to carry on all predisease activities without restriction)  1 - Symptomatic but completely ambulatory (Restricted in physically strenuous activity but ambulatory and able to carry out work of a light or sedentary nature. For example, light housework, office work)  2 - Symptomatic, <50% in bed during the day (Ambulatory and capable of all self care but unable to carry out any work activities. Up and about more than 50% of waking hours)  3 - Symptomatic, >50% in bed, but not bedbound (Capable of only limited self-care, confined to bed or chair 50% or more of waking hours)  4 - Bedbound (Completely disabled. Cannot carry on any self-care. Totally confined to bed or chair)  5 - Death   Eustace Pen MM, Creech RH, Tormey DC, et al. [phone]). "Toxicity and response criteria of the [facility]". [place] Oncol. 5 (6): 649-55  Alert, no distress   LABORATORY DATA:  Lab Results  Component Value Date   WBC 18.1 (H) 03/18/2021   HGB 9.3 (L) 03/18/2021   HCT 29.0 (L) 03/18/2021   MCV 90.9 03/18/2021   PLT 405 (H) 03/18/2021   Lab Results  Component Value Date   NA  134 (L) 03/18/2021   K 4.0 03/18/2021   CL 98 03/18/2021   CO2 26 03/18/2021   Lab Results  Component Value Date   ALT 36 03/15/2021   AST 12 (L) 03/15/2021   ALKPHOS 94 03/15/2021   BILITOT 0.8 03/15/2021      RADIOGRAPHY: DG Chest 2 View  Result Date: 03/15/2021 CLINICAL DATA:  Provided history: Chest pain. Additional history provided: Hemoptysis and pain after lobectomy for lung cancer. Shortness of breath, posterior right upper chest pain. EXAM: CHEST - 2 VIEW COMPARISON:  Prior chest radiograph 03/13/2021 and earlier. FINDINGS: Heart size within normal limits. Redemonstrated postsurgical changes within the right lung apex. Ill-defined airspace opacity within the right mid lung field, new as compared to the chest radiograph of 03/13/2021. The left lung remains clear. Persistent pleural thickening or trace pleural effusion at the right lung base. No evidence of pneumothorax. No acute bony abnormality identified. IMPRESSION: Nonspecific airspace disease within the right mid lung field, new from the chest radiographs of 03/13/2021. Primary consideration include pneumonia, atelectasis or hemorrhage. Redemonstrated postsurgical changes within the right lung apex. Persistent pleural thickening or trace pleural effusion at the right lung base. Electronically Signed   By: Kellie Simmering DO   On: 03/15/2021 13:44   DG Chest 2 View  Result Date: 03/13/2021 CLINICAL DATA:  Status post video assisted thorascopic surgery. EXAM: CHEST - 2 VIEW COMPARISON:  March 06, 2021. FINDINGS: The heart size and mediastinal contours are within normal limits. Left lung is clear. Stable postsurgical changes are noted in the right lung apex. Minimal right basilar subsegmental atelectasis or scarring is noted with small right pleural effusion. The visualized skeletal structures are unremarkable. IMPRESSION: Stable postsurgical changes seen in right lung apex. Minimal right basilar subsegmental atelectasis or scarring is noted  with probable small right pleural effusion. Electronically Signed   By: Marijo Conception M.D.   On: 03/13/2021 14:43   DG Chest 2 View  Result Date: 02/25/2021 CLINICAL DATA:  History of right partial lobectomy with hemoptysis and increasing shortness of breath EXAM: CHEST -  2 VIEW COMPARISON:  02/20/2021 FINDINGS: Cardiac shadow is stable. Left lung remains clear. Right lung demonstrates right-sided pleural effusion and masslike density superiorly with postsurgical changes. This represents pleural fluid loculated from prior resection and stable in appearance. Some increasing parenchymal density is noted throughout the aerated lung which may represent hemorrhage given the history of hemoptysis. No new mass lesion is seen. IMPRESSION: Stable masslike density in the right apex consisting of loculated fluid following recent surgery. Increasing interstitial opacity within the right lung which may represent some hemorrhage given the hemoptysis. Small basilar right sided effusion. Electronically Signed   By: Inez Catalina M.D.   On: 02/25/2021 22:27   DG Chest 2 View  Result Date: 02/20/2021 CLINICAL DATA:  Right upper lobe mass EXAM: CHEST - 2 VIEW COMPARISON:  02/13/2021 FINDINGS: Cardiac shadow is stable. Volume loss on the right is noted with small effusions stable from the prior exam. Postoperative changes are noted in the right apex with persistent soft tissue mass lesion stable from the prior exam. Left lung remains clear. Posterior fracture of the right fifth rib is noted stable from previous exam. IMPRESSION: Stable right upper lobe mass lesion.  Postsurgical changes are seen. Electronically Signed   By: Inez Catalina M.D.   On: 02/20/2021 14:32   CT Chest W Contrast  Result Date: 03/18/2021 CLINICAL DATA:  Hemoptysis.  Recent diagnosis of lung cancer EXAM: CT CHEST WITH CONTRAST TECHNIQUE: Multidetector CT imaging of the chest was performed during intravenous contrast administration. CONTRAST:  44mL  OMNIPAQUE IOHEXOL 300 MG/ML  SOLN COMPARISON:  02/26/2021 FINDINGS: Cardiovascular: Normal heart size. No pericardial effusion. Extensive coronary calcification. No acute vascular finding. Mediastinum/Nodes: Tubular centrally low-density structure along the upper right mediastinum is contiguous with a complex right apical mass and collection. There is right paratracheal nodule measuring 17 mm in diameter, suspicious for metastatic node. Lungs/Pleura: History of right upper lobectomy for cancer. There is a recently drained and re-accumulated loculated collection at the right apex lined by thick masslike nodularity, up to 10 cm on axial slices. Small right pleural effusion at the base which appears more simple and dependent. Airspace opacity in the right lung and to a much lesser extent in the left lung. No discrete cavity or airway mass. Upper Abdomen: Negative Musculoskeletal: The right apical mass is in close continuity with the upper ribs but no erosion is detected. No hematogenous osseous metastatic disease noted either. IMPRESSION: 1. 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 2. The recurrent right apical fluid collection is lined by masslike nodularity most consistent with recurrence. Recommend histologic correlation. 3. Right paratracheal adenopathy. 4. Small right pleural effusion. Electronically Signed   By: Monte Fantasia M.D.   On: 03/18/2021 04:49   CT Angio Chest PE W and/or Wo Contrast  Result Date: 02/26/2021 CLINICAL DATA:  hx of R partial lobectomy, has been coughing up blood since about noon, increased shortness of breath, denies home 02, now on 3L [place]. Pulmonary embolus suspected. VATS and thoracotomy surgery 01/22/2021 EXAM: CT ANGIOGRAPHY CHEST WITH CONTRAST TECHNIQUE: Multidetector CT imaging of the chest was performed using the standard protocol during bolus administration of intravenous contrast. Multiplanar CT image reconstructions and MIPs  were obtained to evaluate the vascular anatomy. CONTRAST:  52mL OMNIPAQUE IOHEXOL 350 MG/ML SOLN COMPARISON:  CT chest 02/16/2021 FINDINGS: Cardiovascular: Satisfactory opacification of the pulmonary arteries to the segmental level. No evidence of pulmonary embolism. Normal heart size. No pericardial effusion. The thoracic aorta is  normal in caliber. Coronary artery calcifications are noted. Mediastinum/Nodes: No enlarged mediastinal, hilar, or axillary lymph nodes. Thyroid gland, trachea, and esophagus demonstrate no significant findings. Lungs/Pleura: Surgical changes related to a partial right lobectomy of the right upper lobe with associated loculated right apical moderate-sized pleural effusion. Persistent trace basilar pleural effusion. Diffuse, right greater than left as well as lower lobe greater than upper lobe, peribronchovascular ground-glass and consolidative opacities. Some peripheral airspace opacities are also noted. Upper Abdomen: No acute abnormality. Musculoskeletal: No chest wall abnormality. No suspicious lytic or blastic osseous lesions. No acute displaced fracture. Redemonstration of subacute right comminuted fifth as well as right nondisplaced sixth rib fractures that are broken in 2 separate places. Multilevel degenerative changes of the spine. Review of the MIP images confirms the above findings. IMPRESSION: 1. No pulmonary embolus. 2. Diffuse pulmonary findings findings suggestive of alveolar hemorrhage versus infection/inflammation versus pulmonary edema. 3. Similar-appearing loculated moderate sized right apical pleural effusion with limited evaluation due to timing of intravenous contrast. Persistent trace right basilar pleural effusion. 4. Subacute right comminuted fifth as well as right nondisplaced sixth rib fractures that are broken in 2 separate places consistent with history of thoracotomy. 5. Coronary artery calcifications. Electronically Signed   By: Iven Finn M.D.   On:  02/26/2021 04:59   DG Chest Portable 1 View  Result Date: 03/18/2021 CLINICAL DATA:  Hemoptysis for several hours, history of known lung carcinoma EXAM: PORTABLE CHEST 1 VIEW COMPARISON:  03/15/2021 FINDINGS: Cardiac shadow is within normal limits. Postsurgical changes are noted in the right apex with persistent soft tissue density in the apex stable from the prior exam. The previously seen right mid lung airspace opacity has resolved in the interval. Left lung remains clear. No bony abnormality is noted. IMPRESSION: Clearing of previously seen right mid lung airspace opacity. Remainder of the exam is stable from the prior study. Electronically Signed   By: Inez Catalina M.D.   On: 03/18/2021 03:18   DG CHEST PORT 1 VIEW  Result Date: 03/06/2021 CLINICAL DATA:  Pneumothorax.  Hypoxia. EXAM: PORTABLE CHEST 1 VIEW COMPARISON:  03/04/2021.  03/02/2021.  03/01/2021.  CT 02/26/2021. FINDINGS: Postsurgical changes in the right apex again noted. Again patient status post removal of right apical chest tube. Slight reaccumulation of right apical pleural fluid cannot be excluded. Mild left upper lung infiltrate. No pneumothorax. Low lung volumes. Tiny bilateral basilar pleural effusions cannot be excluded. Stable cardiomegaly. IMPRESSION: 1. Postsurgical changes right apex again noted. Again patient status post removal of right apical chest tube. Slight reaccumulation of right apical pleural fluid cannot be excluded. No pneumothorax. 2. Mild left upper lung infiltrate. Low lung volumes. Tiny bilateral basilar pleural effusions cannot be excluded. Electronically Signed   By: [place]   On: 03/06/2021 07:45   DG CHEST PORT 1 VIEW  Result Date: 03/04/2021 CLINICAL DATA:  Follow-up exam. Edema. Status post chest tube. Status post right video-assisted lobectomy. EXAM: PORTABLE CHEST 1 VIEW COMPARISON:  03/03/2021 and older exams. FINDINGS: Since the earlier study, the small caliber right chest tube has been  removed. No pneumothorax. Postsurgical changes in the right upper lung/hemithorax are stable. Lungs demonstrate bilateral interstitial opacities similar to the previous day's exam. No new lung abnormalities. No convincing pleural effusion. IMPRESSION: 1. Status post removal of the right-sided chest tube. 2. No pneumothorax. 3. No other change from the previous day's study. Electronically Signed   By: Lajean Manes M.D.   On: 03/04/2021 09:22   DG  Chest Port 1 View  Result Date: 03/03/2021 CLINICAL DATA:  60 year old male with history of HTN, stage III adenocarcinoma of lung, depression, recent admission 5/16-5/20 during which he underwent right upper lobe lobectomy and chest wall dissection(VATS) by Dr. Roxan Hockey, presented with severe cough, bright red hemoptysis, shortness of breath.Chest CT revealed diffuse infiltrates concerning for alveolar hemorrhage with possible superimposed infectious process/pulmonary edema. Patient was started on broad-spectrum antibiotics. EXAM: PORTABLE CHEST 1 VIEW COMPARISON:  03/02/2021 and older exams. FINDINGS: Postsurgical changes at the right lung apex are stable as is the small caliber right upper hemithorax chest tube. Interstitial lung opacities are similar to the prior study. Hazy airspace opacities noted previously have improved. No new lung abnormalities. No convincing pneumothorax or pleural effusion. IMPRESSION: 1. Improved airspace lung opacities compared to exams dating back to 02/26/2021. Mild persistent interstitial lung opacities. No new lung abnormalities. 2. Stable post surgical changes in the right upper hemithorax. No pneumothorax. Electronically Signed   By: Lajean Manes M.D.   On: 03/03/2021 08:15   DG Chest Port 1 View  Result Date: 03/02/2021 CLINICAL DATA:  VATS/right upper lobectomy.  Right chest tube. EXAM: PORTABLE CHEST 1 VIEW COMPARISON:  03/01/2021 FINDINGS: Stable position of the right apical chest tube. Residual pleural based densities at  the right lung apex with postsurgical changes. Stable volume loss in the right hemithorax. Hazy interstitial lung densities bilaterally that have minimally changed. Heart size is stable. IMPRESSION: 1. Stable positioning of the right apical small bore chest tube. Aeration at the right lung apex has markedly improved since chest tube placement but there are residual densities in this area. Negative for a large pneumothorax. 2. Diffuse interstitial lung densities that could represent interstitial pulmonary edema. Findings are similar to the recent comparison examination. Electronically Signed   By: Markus Daft M.D.   On: 03/02/2021 11:04   DG Chest Port 1 View  Result Date: 03/01/2021 CLINICAL DATA:  Status post right lung lobectomy. EXAM: PORTABLE CHEST 1 VIEW COMPARISON:  02/28/2021.  02/27/2021. FINDINGS: Right chest tube in stable position. Stable postsurgical changes right apex. Tiny residual right apical pneumothorax cannot be completely excluded. Bilateral interstitial infiltrates/edema, slightly progressed on the left from prior exam. No prominent pleural effusion. Heart size stable. IMPRESSION: 1. Right apical chest tube in stable position. Stable postsurgical changes right apex. Tiny residual right apical pneumothorax cannot be completely excluded. 2. Bilateral interstitial infiltrates/edema slightly progressed on the left from prior exam. Electronically Signed   By: [place]   On: 03/01/2021 08:36   DG Chest Port 1 View  Result Date: 02/28/2021 CLINICAL DATA:  Chest tube.  Shortness of breath. EXAM: PORTABLE CHEST 1 VIEW COMPARISON:  02/27/2021. FINDINGS: Surgical clips right chest. Right chest tube in stable position over the right apex. Stable right apical pleural thickening and tiny pneumothorax noted. Stable right lateral pleural thickening. Bilateral pulmonary infiltrates/edema, improved from prior exam. No acute bony abnormality. IMPRESSION: 1. Surgical clips right chest. Right chest  tube in stable position over the right apex. Stable right apical pleural thickening and tiny pneumothorax noted. Stable right lateral pleural thickening. 2.  Bilateral pulmonary infiltrates/edema, improved from prior exam. Electronically Signed   By: [place]   On: 02/28/2021 09:50   DG Chest Port 1 View  Result Date: 02/27/2021 CLINICAL DATA:  New hemoptysis, fatigue, worsening shortness of breath, hypoxia EXAM: PORTABLE CHEST 1 VIEW COMPARISON:  Portable exam 0835 hours compared to 02/25/2021 FINDINGS: Pigtail thoracostomy tube at RIGHT apex with decrease  in loculated pleural effusion. Small loculated pneumothorax at RIGHT apex. Stable heart size and mediastinal contours. BILATERAL pulmonary infiltrates in the mid to lower lungs, increased on LEFT since previous exam. No acute osseous findings. IMPRESSION: Small residual loculated hydropneumothorax at RIGHT apex post chest tube insertion. BILATERAL mid to lower lung pulmonary infiltrates, slightly increased. Electronically Signed   By: Lavonia Dana M.D.   On: 02/27/2021 11:20   CT IMAGE GUIDED DRAINAGE BY PERCUTANEOUS CATHETER  Result Date: 02/26/2021 INDICATION: 60 year old male with a history recent surgery, presentation with hemoptysis, and loculated fluid of the surgery site. He has been referred for CT-guided chest tube placement EXAM: CT-GUIDED CHEST TUBE MEDICATIONS: The patient is currently admitted to the hospital and receiving intravenous antibiotics. The antibiotics were administered within an appropriate time frame prior to the initiation of the procedure. ANESTHESIA/SEDATION: 1.0 mg IV Versed 25 mcg IV Fentanyl Moderate Sedation Time:  16 minutes The patient was continuously monitored during the procedure by the interventional radiology nurse under my direct supervision. COMPLICATIONS: None TECHNIQUE: Informed written consent was obtained from the patient after a thorough discussion of the procedural risks, benefits and alternatives.  All questions were addressed. Maximal Sterile Barrier Technique was utilized including caps, mask, sterile gowns, sterile gloves, sterile drape, hand hygiene and skin antiseptic. A timeout was performed prior to the initiation of the procedure. PROCEDURE: Patient was positioned in the supine position on the CT gantry table and a scout CT of the chest was performed for planning purposes. Once angle of approach was determined, the skin and subcutaneous tissues this scan was prepped and draped in the usual sterile fashion, and a sterile drape was applied covering the operative field. A sterile gown and sterile gloves were used for the procedure. Local anesthesia was provided with 1% Lidocaine. The skin and subcutaneous tissues were infiltrated 1% lidocaine for local anesthesia, and a small stab incision was made with an 11 blade scalpel. Using CT guidance, a 18 gauge trocar needle was advanced into the pleural space of the right apex. After confirmation of the tip, modified Seldinger technique was used to place a 10 French pigtail drainage catheter. Approximately 60 cc of reddish thin fluid aspirated. Culture was sent to the lab for analysis. Catheter was then attached to water seal chamber and suction was applied confirming a operational chest tube. Retention suture was placed.  Sterile dressing was placed. Patient tolerated the procedure well and remained hemodynamically stable throughout. No complications were encountered and no significant blood loss was encounter FINDINGS: The scout CT demonstrates essentially a similar distribution and volume of confluent ground-glass opacity of the right lower lobe, right middle lobe, and left lower lobe. No significant fluid identified within the airways. Similar appearance of the loculated fluid collection within the surgical site at the right apex. After placement of the chest tube approximately 60 cc of thin reddish fluid aspirated which appears to be serosanguineous. No  frank abscess. Culture was sent. IMPRESSION: Status post CT-guided right apical chest tube into loculated fluid revealing serosanguineous fluid. Culture was sent. Signed, Dulcy Fanny. Dellia Nims, RPVI [facility] Electronically Signed   By: Corrie Mckusick D.O.   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  It is felt that residual tumor is likely present at the margin towards the spine and surgical clips were placed at this  location.  The patient's case has been discussed in multidisciplinary thoracic conference and it is felt that he would be a good candidate and would benefit from postoperative radiation treatment to reduce the chance of local failure.  I discussed such a treatment with the patient to include approximately 6-1/2 weeks.  We discussed the rationale of treatment as well as possible side effects and risks.  All of his questions were answered and he does wish to proceed with such a treatment.  He will undergo simulation as soon as possible.  I believe that there was some delay in getting [place] connected with him given his prior hospitalization but we will move as quickly as possible.  Again, he also sees Dr. Julien Nordmann next week to further discuss systemic treatment options as well.  The patient was seen in person today in clinic.  The total time spent on the patient's visit today was 45 minutes, including chart review, direct discussion/evaluation with the patient, and coordination of care.        ________________________________   Jodelle Gross, MD, PhD   **Disclaimer: This note was dictated with voice recognition software. Similar sounding words can inadvertently be transcribed and this note may contain transcription errors which may not have been corrected upon publication of note.**

## 2021-03-18 NOTE — ED Provider Notes (Signed)
[facility] EMERGENCY DEPT Provider Note   CSN: [id number] Arrival date & time: 03/18/21  0230     History Chief Complaint  Patient presents with   Hemoptysis   Back Pain    Christopher Carter is a 60 y.o. male.  HPI     This a 60 year old male with adenocarcinoma of the lung who presents with hemoptysis.  Wife provides most of the history.  Reports that he has been having daily hemoptysis mostly in the morning.  Over the last several days this has been recurrent.  However, tonight for the last 4 hours he has had significant coughing and hemoptysis.  He describes clumps of bright red blood.  He is not on any blood thinners.  At baseline he is on 4 L of oxygen.  He increase this to 6 for comfort.  He reports some shortness of breath and some right-sided back pain.  He has been having some right-sided back and shoulder discomfort since having his lobectomy.  Reports that it radiates into the right neck into the right arm.  Currently his pain is 10 out of 10 and it is worse with coughing.  No recent fevers.  He is currently on penicillin for an abscessed tooth.  He is supposed to have a root canal on Monday.  Chart review: Patient admitted to the hospital imaging.  He had diffuse infiltrate on his CT scan concerning for alveolar hemorrhage with possible infectious process.  He was started on antibiotics.  He also had a right chest tube for hemothorax.  Previously had a lobectomy by Dr. Roxan Hockey 5/16. 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   Acute respiratory failure with hypoxia ([place]) 02/26/2021   Adenocarcinoma of right lung ([place]) 02/26/2021   Pneumonia of right lung due to infectious organism 02/26/2021   Sepsis due to pneumonia ([place]) 02/26/2021   Major depressive disorder  02/26/2021   S/P lobectomy of lung 01/22/2021   Essential hypertension 01/15/2021   Mass of upper lobe of right lung 12/22/2020   Hemoptysis 12/22/2020    Past Surgical History:  Procedure Laterality Date   ANKLE FRACTURE SURGERY Right    BRONCHIAL BIOPSY  01/05/2021   Procedure: BRONCHIAL BIOPSIES;  Surgeon: Collene Gobble, MD;  Location: [place];  Service: Cardiopulmonary;;   BRONCHIAL BRUSHINGS  01/05/2021   Procedure: BRONCHIAL BRUSHINGS;  Surgeon: Collene Gobble, MD;  Location: [place];  Service: Cardiopulmonary;;   BRONCHIAL NEEDLE ASPIRATION BIOPSY  01/05/2021   Procedure: BRONCHIAL NEEDLE ASPIRATION BIOPSIES;  Surgeon: Collene Gobble, MD;  Location: [place];  Service: Cardiopulmonary;;   COLON SURGERY  2017   bowel obstruction surgery per pt    ENDOBRONCHIAL ULTRASOUND N/A 01/05/2021   Procedure: ENDOBRONCHIAL ULTRASOUND;  Surgeon: Collene Gobble, MD;  Location: [facility] ENDOSCOPY;  Service: Cardiopulmonary;  Laterality: N/A;   INTERCOSTAL NERVE BLOCK Right 01/22/2021   Procedure: INTERCOSTAL NERVE BLOCK;  Surgeon: [name], MD;  Location: [place];  Service: Thoracic;  Laterality: Right;   LAPAROSCOPY N/A 04/25/2017   Procedure: LAPAROSCOPY ASSISTED ILEO-CECECTOMY WITH SMALL BOWEL RESECTION WITH ANASTOMOSIS;  Surgeon: Excell Seltzer, MD;  Location: [facility];  Service: General;  Laterality: N/A;   LYMPH NODE DISSECTION Right 01/22/2021   Procedure: LYMPH NODE DISSECTION;  Surgeon: [name], MD;  Location: [place];  Service: Thoracic;  Laterality: Right;   THORACOTOMY Right 01/22/2021   Procedure: THORACOTOMY;  Surgeon: [name], MD;  Location: [place];  Service: Thoracic;  Laterality: Right;   VIDEO ASSISTED THORACOSCOPY (VATS)/ LOBECTOMY Right 01/22/2021   Procedure: VIDEO ASSISTED THORACOSCOPY (VATS)/RIGHT UPPER LOBECTOMY;  Surgeon: [name], MD;  Location: [place];  Service: Thoracic;  Laterality: Right;   VIDEO BRONCHOSCOPY  N/A 01/05/2021   Procedure: VIDEO BRONCHOSCOPY WITH FLUORO;  Surgeon: Collene Gobble, MD;  Location: [place];  Service: Cardiopulmonary;  Laterality: N/A;       History reviewed. No pertinent family history.  Social History   Tobacco Use   Smoking status: Never   Smokeless tobacco: Never  Vaping Use   Vaping Use: Never used  Substance Use Topics   Alcohol use: Not Currently    Alcohol/week: 21.0 standard drinks    Types: 21 Shots of liquor per week    Comment: 3-4 shots of whiskey a day   Drug use: No    Home Medications Prior to Admission medications   Medication Sig Start Date End Date Taking? Authorizing Provider  acetaminophen (TYLENOL) 500 MG tablet Take 500 mg by mouth 2 (two) times daily as needed for moderate pain or headache.    [provider]  amLODipine (NORVASC) 10 MG tablet TAKE 1 TABLET BY MOUTH EVERY DAY Patient taking differently: Take 10 mg by mouth daily. 02/19/21   [name], MD  Ascorbic Acid (VITAMIN C) 1000 MG tablet Take 1,000 mg by mouth daily.    [provider]  furosemide (LASIX) 20 MG tablet Take 1 tablet (20 mg total) by mouth daily. 03/07/21   Regalado, Belkys A, MD  gabapentin (NEURONTIN) 300 MG capsule Take 300 mg by mouth 3 (three) times daily. 03/12/21   [provider]  HYDROmorphone (DILAUDID) 2 MG tablet Take 1-2 tablets (2-4 mg total) by mouth every 4 (four) hours as needed for severe pain. 03/13/21   Elgie Collard, PA-C  irbesartan (AVAPRO) 300 MG tablet Take 300 mg by mouth daily.    [provider]  latanoprost (XALATAN) 0.005 % ophthalmic solution Place 1 drop into both eyes at bedtime.    [provider]  lidocaine (LIDODERM) 5 % Place 1 patch onto the skin daily. Remove & Discard patch within 12 hours or as directed by MD 03/07/21   Regalado, Jerald Kief A, MD  pantoprazole (PROTONIX) 40 MG tablet Take 1 tablet (40 mg total) by mouth daily. 03/08/21   Regalado, Belkys A, MD   polyethylene glycol (MIRALAX / GLYCOLAX) 17 g packet Take 17 g by mouth daily as needed for mild constipation. 03/07/21   Regalado, Belkys A, MD  potassium chloride SA (KLOR-CON) 20 MEQ tablet Take 1 tablet (20 mEq total) by mouth daily. 03/07/21   Regalado, Belkys A, MD  predniSONE (DELTASONE) 20 MG tablet Take 40 mg daily for 5 days, then 20 mg daily for 5 days then 10 mg daily for 5 days then stop. 03/07/21   Regalado, Belkys A, MD  QUEtiapine (SEROQUEL) 50 MG tablet Take 50 mg by mouth at bedtime. 11/06/20   [provider]  sertraline (ZOLOFT) 100 MG tablet Take 100 mg by mouth daily. 10/29/20   [provider]  vitamin B-12 (CYANOCOBALAMIN) 1000 MCG tablet Take 1,000 mcg by mouth daily.    [provider]    Allergies    Oxycodone hcl and  Bupropion  Review of Systems   Review of Systems  Constitutional:  Negative for fever.  Respiratory:  Positive for cough, shortness of breath and wheezing.   Cardiovascular:  Negative for chest pain.  Gastrointestinal:  Negative for abdominal pain, nausea and vomiting.  Musculoskeletal:  Positive for back pain.  All other systems reviewed and are negative.  Physical Exam Updated Vital Signs BP 120/82   Pulse 94   Temp 98.7 F (37.1 C) (Oral)   Resp 12   Ht 1.778 m (5\' 10" )   Wt 79.4 kg   SpO2 99%   BMI 25.11 kg/m   Physical Exam Vitals and nursing note reviewed.  Constitutional:      Appearance: He is well-developed.     Comments: Ill-appearing  HENT:     Head: Normocephalic and atraumatic.     Nose: Nose normal.     Mouth/Throat:     Mouth: Mucous membranes are dry.  Eyes:     Pupils: Pupils are equal, round, and reactive to light.  Cardiovascular:     Rate and Rhythm: Regular rhythm. Tachycardia present.     Heart sounds: Normal heart sounds. No murmur heard. Pulmonary:     Effort: Respiratory distress present.     Breath sounds: Wheezing and rales present.     Comments: Mild tachypnea, coarse Rales  in all lung fields, end expiratory wheezing right greater than left, speaking in short sentences Abdominal:     Palpations: Abdomen is soft.     Tenderness: There is no abdominal tenderness.  Musculoskeletal:     Cervical back: Neck supple.     Right lower leg: No edema.     Left lower leg: No edema.  Skin:    General: Skin is warm and dry.  Neurological:     Mental Status: He is oriented to person, place, and time.  Psychiatric:        Mood and Affect: Mood normal.    ED Results / Procedures / Treatments   Labs (all labs ordered are listed, but only abnormal results are displayed) Labs Reviewed  CBC WITH DIFFERENTIAL/PLATELET - Abnormal; Notable for the following components:      Result Value   WBC 18.1 (*)    RBC 3.64 (*)    Hemoglobin 10.8 (*)    HCT 33.1 (*)    Platelets 405 (*)    Neutro Abs 13.2 (*)    Monocytes Absolute 1.8 (*)    Eosinophils Absolute 0.9 (*)    Abs Immature Granulocytes 0.14 (*)    All other components within normal limits  BASIC METABOLIC PANEL - Abnormal; Notable for the following components:   Sodium 134 (*)    Glucose, Bld 172 (*)    Creatinine, Ser 0.50 (*)    All other components within normal limits  RESP PANEL BY RT-PCR (FLU A&B, COVID) ARPGX2  PROTIME-INR    EKG None  Radiology CT Chest W Contrast  Result Date: 03/18/2021 CLINICAL DATA:  Hemoptysis.  Recent diagnosis of lung cancer EXAM: CT CHEST WITH CONTRAST TECHNIQUE: Multidetector CT imaging of the chest was performed during intravenous contrast administration. CONTRAST:  76mL OMNIPAQUE IOHEXOL 300 MG/ML  SOLN COMPARISON:  02/26/2021 FINDINGS: Cardiovascular: Normal heart size. No pericardial effusion. Extensive coronary calcification. No acute vascular finding. Mediastinum/Nodes: Tubular centrally low-density structure along the upper right mediastinum is contiguous with a complex right apical mass and collection. There is right paratracheal nodule measuring 17 mm in diameter,  suspicious for metastatic node. Lungs/Pleura:  History of right upper lobectomy for cancer. There is a recently drained and re-accumulated loculated collection at the right apex lined by thick masslike nodularity, up to 10 cm on axial slices. Small right pleural effusion at the base which appears more simple and dependent. Airspace opacity in the right lung and to a much lesser extent in the left lung. No discrete cavity or airway mass. Upper Abdomen: Negative Musculoskeletal: The right apical mass is in close continuity with the upper ribs but no erosion is detected. No hematogenous osseous metastatic disease noted either. IMPRESSION: 1. 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 2. The recurrent right apical fluid collection is lined by masslike nodularity most consistent with recurrence. Recommend histologic correlation. 3. Right paratracheal adenopathy. 4. Small right pleural effusion. Electronically Signed   By: Monte Fantasia M.D.   On: 03/18/2021 04:49   DG Chest Portable 1 View  Result Date: 03/18/2021 CLINICAL DATA:  Hemoptysis for several hours, history of known lung carcinoma EXAM: PORTABLE CHEST 1 VIEW COMPARISON:  03/15/2021 FINDINGS: Cardiac shadow is within normal limits. Postsurgical changes are noted in the right apex with persistent soft tissue density in the apex stable from the prior exam. The previously seen right mid lung airspace opacity has resolved in the interval. Left lung remains clear. No bony abnormality is noted. IMPRESSION: Clearing of previously seen right mid lung airspace opacity. Remainder of the exam is stable from the prior study. Electronically Signed   By: Inez Catalina M.D.   On: 03/18/2021 03:18    Procedures .Critical Care  Date/Time: 03/18/2021 5:42 AM Performed by: Merryl Hacker, MD Authorized by: Merryl Hacker, MD   Critical care provider statement:    Critical care time (minutes):  45   Critical care  was time spent personally by me on the following activities:  Discussions with consultants, evaluation of patient's response to treatment, examination of patient, ordering and performing treatments and interventions, ordering and review of laboratory studies, ordering and review of radiographic studies, pulse oximetry, re-evaluation of patient's condition, obtaining history from patient or surrogate and review of old charts   Medications Ordered in ED Medications  albuterol (VENTOLIN HFA) 108 (90 Base) MCG/ACT inhaler 2 puff (has no administration in time range)  fentaNYL (SUBLIMAZE) injection 100 mcg (has no administration in time range)  ipratropium-albuterol (DUONEB) 0.5-2.5 (3) MG/3ML nebulizer solution 3 mL (has no administration in time range)  HYDROmorphone (DILAUDID) injection 1 mg (1 mg Intravenous Given 03/18/21 0304)  ondansetron (ZOFRAN) injection 4 mg (4 mg Intravenous Given 03/18/21 0304)  ipratropium-albuterol (DUONEB) 0.5-2.5 (3) MG/3ML nebulizer solution 3 mL (3 mLs Nebulization Given 03/18/21 0310)  iohexol (OMNIPAQUE) 300 MG/ML solution 75 mL (75 mLs Intravenous Contrast Given 03/18/21 0355)  fentaNYL (SUBLIMAZE) injection 100 mcg (100 mcg Intravenous Given 03/18/21 0404)  ipratropium-albuterol (DUONEB) 0.5-2.5 (3) MG/3ML nebulizer solution 3 mL (3 mLs Nebulization Given 03/18/21 0446)    ED Course  I have reviewed the triage vital signs and the nursing notes.  Pertinent labs & imaging results that were available during my care of the patient were reviewed by me and considered in my medical decision making (see chart for details).  Clinical Course as of 03/18/21 0542  Sun Mar 18, 2021  0527 Spoke with the patient and his wife regarding CT findings.  Concern for some reaccumulation of fluid collection in the right upper lobe and nodular appearance of capsule concerning for recurrence.  He also has evidence of  alveolar hemorrhage.  Vital signs have stabilized.  He does continue to  have a cough but states he feels improved after nebulizer treatments.  We discussed plans of care.  Given his ongoing hemoptysis and evidence of recurrent alveolar hemorrhage, would recommend he be evaluated as an inpatient by pulmonology and CT surgery for further options. [CH]  [phone] Spoke with Dr. Patsey Berthold, pulmonary critical care.  Does not need ICU at this time.  However, she agrees that admission likely to stepdown unit is appropriate.  Pulmonary can be consulted upon his arrival. [CH]    Clinical Course User Index [CH] Tacari Repass, Barbette Hair, MD   MDM Rules/Calculators/A&P                          Patient presents with hemoptysis.  Ongoing for several days.  He is tachypneic and tachycardic.  He is ill-appearing.  He has increased his oxygen at home for comfort.  He is wheezing and has rales on exam.  Concern for recurrent alveolar hemorrhage versus infection versus worsening cancer.  Repeat lab work was obtained.  Hemoglobin is stable.  PT/INR is normal.  Chest x-ray shows an improving airspace opacity from prior but stable recurrence of right upper lobe fluid collection.  Will obtain CT imaging to further evaluate.  Patient is also having significant ongoing upper back and scapular pain.  He was dosed pain medication.  There is no pneumothorax on chest x-ray.  CT findings are concerning for alveolar hemorrhage, recurrence of fluid collection and recurrence of mass in the right upper lobe.  See clinical course above.  I had a long conversation with the patient and his wife.  I have reviewed the patient's chart.  It appears that his intermittently having suicidal ideation secondary to pain.  They have also requested a palliative evaluation as an outpatient.  Patient is not actively suicidal at this time but states his pain gets so bad at times that he has had thoughts of hurting himself.  He has never tried to hurt himself in the past.  I have encouraged him and his wife to request a palliative evaluation  even if they are pursuing ongoing curative measures as the palliative service could be a great help outlining goals of care and helping with pain control.   Final Clinical Impression(s) / ED Diagnoses Final diagnoses:  Pulmonary alveolar hemorrhage  Acute on chronic respiratory failure with hypoxia ([place])  Hemoptysis    Rx / DC Orders ED Discharge Orders     None        Depaul Arizpe, Barbette Hair, MD 03/18/21 [phone]

## 2021-03-18 NOTE — Addendum Note (Signed)
Encounter addended by: Kyung Rudd, MD on: 03/18/2021 5:31 PM  Actions taken: Problem List modified, Clinical Note Signed

## 2021-03-18 NOTE — ED Triage Notes (Signed)
  Patient comes in with hemoptysis that started around 4 hours ago.  Wife states it is bright red.  Diagnosed with stage 3 non small cell lung cancer but has not started chemo yet.  Patient had lobectomy done in 05/22.  Patient on 5L [place] at baseline. Patient states he couldn't stop coughing and wanted to make sure everything was ok.  Patient endorsing R upper back pain that radiates to neck.  Patient admitted in June for fluid build up and had chest tube inserted.  Pain 10/10.

## 2021-03-19 ENCOUNTER — Encounter (HOSPITAL_COMMUNITY): Payer: Self-pay | Admitting: Internal Medicine

## 2021-03-19 ENCOUNTER — Inpatient Hospital Stay: Payer: 59 | Admitting: Primary Care

## 2021-03-19 ENCOUNTER — Encounter (HOSPITAL_COMMUNITY)
Admission: EM | Disposition: A | Discharge status: Home / Self Care | Payer: Self-pay | Source: Home / Self Care | Attending: Student

## 2021-03-19 ENCOUNTER — Observation Stay (HOSPITAL_COMMUNITY): Payer: 59 | Admitting: Certified Registered Nurse Anesthetist

## 2021-03-19 DIAGNOSIS — J9621 Acute and chronic respiratory failure with hypoxia: Secondary | ICD-10-CM

## 2021-03-19 DIAGNOSIS — R0489 Hemorrhage from other sites in respiratory passages: Secondary | ICD-10-CM | POA: Diagnosis present

## 2021-03-19 DIAGNOSIS — D72829 Elevated white blood cell count, unspecified: Secondary | ICD-10-CM | POA: Diagnosis not present

## 2021-03-19 DIAGNOSIS — D649 Anemia, unspecified: Secondary | ICD-10-CM | POA: Diagnosis not present

## 2021-03-19 DIAGNOSIS — Z515 Encounter for palliative care: Secondary | ICD-10-CM | POA: Diagnosis not present

## 2021-03-19 DIAGNOSIS — F419 Anxiety disorder, unspecified: Secondary | ICD-10-CM | POA: Diagnosis present

## 2021-03-19 DIAGNOSIS — Z0120 Encounter for dental examination and cleaning without abnormal findings: Secondary | ICD-10-CM | POA: Diagnosis not present

## 2021-03-19 DIAGNOSIS — J851 Abscess of lung with pneumonia: Secondary | ICD-10-CM | POA: Diagnosis not present

## 2021-03-19 DIAGNOSIS — R042 Hemoptysis: Secondary | ICD-10-CM | POA: Diagnosis present

## 2021-03-19 DIAGNOSIS — E1165 Type 2 diabetes mellitus with hyperglycemia: Secondary | ICD-10-CM | POA: Diagnosis not present

## 2021-03-19 DIAGNOSIS — K0381 Cracked tooth: Secondary | ICD-10-CM | POA: Diagnosis not present

## 2021-03-19 DIAGNOSIS — R52 Pain, unspecified: Secondary | ICD-10-CM | POA: Diagnosis not present

## 2021-03-19 DIAGNOSIS — J942 Hemothorax: Secondary | ICD-10-CM | POA: Diagnosis not present

## 2021-03-19 DIAGNOSIS — R45851 Suicidal ideations: Secondary | ICD-10-CM | POA: Diagnosis present

## 2021-03-19 DIAGNOSIS — Z4682 Encounter for fitting and adjustment of non-vascular catheter: Secondary | ICD-10-CM | POA: Diagnosis not present

## 2021-03-19 DIAGNOSIS — I10 Essential (primary) hypertension: Secondary | ICD-10-CM | POA: Diagnosis present

## 2021-03-19 DIAGNOSIS — Z79899 Other long term (current) drug therapy: Secondary | ICD-10-CM | POA: Diagnosis not present

## 2021-03-19 DIAGNOSIS — Z885 Allergy status to narcotic agent status: Secondary | ICD-10-CM | POA: Diagnosis not present

## 2021-03-19 DIAGNOSIS — Z888 Allergy status to other drugs, medicaments and biological substances status: Secondary | ICD-10-CM | POA: Diagnosis not present

## 2021-03-19 DIAGNOSIS — C3491 Malignant neoplasm of unspecified part of right bronchus or lung: Secondary | ICD-10-CM | POA: Diagnosis not present

## 2021-03-19 DIAGNOSIS — I959 Hypotension, unspecified: Secondary | ICD-10-CM | POA: Diagnosis not present

## 2021-03-19 DIAGNOSIS — F329 Major depressive disorder, single episode, unspecified: Secondary | ICD-10-CM | POA: Diagnosis present

## 2021-03-19 DIAGNOSIS — A419 Sepsis, unspecified organism: Secondary | ICD-10-CM | POA: Diagnosis not present

## 2021-03-19 DIAGNOSIS — J9811 Atelectasis: Secondary | ICD-10-CM | POA: Diagnosis present

## 2021-03-19 DIAGNOSIS — J90 Pleural effusion, not elsewhere classified: Secondary | ICD-10-CM | POA: Diagnosis not present

## 2021-03-19 DIAGNOSIS — Z20822 Contact with and (suspected) exposure to covid-19: Secondary | ICD-10-CM | POA: Diagnosis present

## 2021-03-19 DIAGNOSIS — K047 Periapical abscess without sinus: Secondary | ICD-10-CM | POA: Diagnosis present

## 2021-03-19 DIAGNOSIS — C3411 Malignant neoplasm of upper lobe, right bronchus or lung: Secondary | ICD-10-CM | POA: Diagnosis present

## 2021-03-19 DIAGNOSIS — D630 Anemia in neoplastic disease: Secondary | ICD-10-CM | POA: Diagnosis present

## 2021-03-19 DIAGNOSIS — R6520 Severe sepsis without septic shock: Secondary | ICD-10-CM | POA: Diagnosis not present

## 2021-03-19 DIAGNOSIS — D75839 Thrombocytosis, unspecified: Secondary | ICD-10-CM | POA: Diagnosis present

## 2021-03-19 HISTORY — PX: VIDEO BRONCHOSCOPY: SHX5072

## 2021-03-19 LAB — PROCALCITONIN: Procalcitonin: 0.12 ng/mL

## 2021-03-19 LAB — CBC
HCT: 28.8 % — ABNORMAL LOW (ref 39.0–52.0)
Hemoglobin: 9.4 g/dL — ABNORMAL LOW (ref 13.0–17.0)
MCH: 30.1 pg (ref 26.0–34.0)
MCHC: 32.6 g/dL (ref 30.0–36.0)
MCV: 92.3 fL (ref 80.0–100.0)
Platelets: 352 10*3/uL (ref 150–400)
RBC: 3.12 MIL/uL — ABNORMAL LOW (ref 4.22–5.81)
RDW: 14.7 % (ref 11.5–15.5)
WBC: 19.4 10*3/uL — ABNORMAL HIGH (ref 4.0–10.5)
nRBC: 0 % (ref 0.0–0.2)

## 2021-03-19 LAB — BASIC METABOLIC PANEL
Anion gap: 8 (ref 5–15)
BUN: 8 mg/dL (ref 6–20)
CO2: 27 mmol/L (ref 22–32)
Calcium: 8.5 mg/dL — ABNORMAL LOW (ref 8.9–10.3)
Chloride: 95 mmol/L — ABNORMAL LOW (ref 98–111)
Creatinine, Ser: 0.51 mg/dL — ABNORMAL LOW (ref 0.61–1.24)
GFR, Estimated: >60 mL/min (ref >60)
Glucose, Bld: 158 mg/dL — ABNORMAL HIGH (ref 70–99)
Potassium: 4.2 mmol/L (ref 3.5–5.1)
Sodium: 130 mmol/L — ABNORMAL LOW (ref 135–145)

## 2021-03-19 LAB — APTT: aPTT: 30 seconds (ref 24–36)

## 2021-03-19 LAB — PROTIME-INR
INR: 1.1 (ref 0.8–1.2)
Prothrombin Time: 14.4 seconds (ref 11.4–15.2)

## 2021-03-19 SURGERY — VIDEO BRONCHOSCOPY WITHOUT FLUORO
Anesthesia: General

## 2021-03-19 MED ORDER — PHENYLEPHRINE 40 MCG/ML (10ML) SYRINGE FOR IV PUSH (FOR BLOOD PRESSURE SUPPORT)
PREFILLED_SYRINGE | INTRAVENOUS | Status: DC | PRN
  Administered 2021-03-19 (×5): 120 ug via INTRAVENOUS

## 2021-03-19 MED ORDER — LIDOCAINE HCL URETHRAL/MUCOSAL 2 % EX GEL: 1.0000 "application " | Freq: Once | CUTANEOUS | Status: DC

## 2021-03-19 MED ORDER — FENTANYL CITRATE (PF) 250 MCG/5ML IJ SOLN
INTRAMUSCULAR | Status: DC | PRN
  Administered 2021-03-19: 100 ug via INTRAVENOUS

## 2021-03-19 MED ORDER — HYDROMORPHONE 1 MG/ML IV SOLN: INTRAVENOUS | Status: DC

## 2021-03-19 MED ORDER — LIDOCAINE 2% (20 MG/ML) 5 ML SYRINGE
INTRAMUSCULAR | Status: DC | PRN
  Administered 2021-03-19: 60 mg via INTRAVENOUS

## 2021-03-19 MED ORDER — PHENYLEPHRINE HCL-NACL 10-0.9 MG/250ML-% IV SOLN
INTRAVENOUS | Status: DC | PRN
  Administered 2021-03-19: 20 ug/min via INTRAVENOUS

## 2021-03-19 MED ORDER — ONDANSETRON HCL 4 MG/2ML IJ SOLN
INTRAMUSCULAR | Status: DC | PRN
  Administered 2021-03-19: 4 mg via INTRAVENOUS

## 2021-03-19 MED ORDER — TRANEXAMIC ACID-NACL 1000-0.7 MG/100ML-% IV SOLN
1000.0000 mg | Freq: Once | INTRAVENOUS | Status: DC
  Filled 2021-03-19: qty 100

## 2021-03-19 MED ORDER — EPHEDRINE SULFATE-NACL 50-0.9 MG/10ML-% IV SOSY
PREFILLED_SYRINGE | INTRAVENOUS | Status: DC | PRN
  Administered 2021-03-19: 10 mg via INTRAVENOUS

## 2021-03-19 MED ORDER — LACTATED RINGERS IV SOLN
INTRAVENOUS | Status: AC | PRN
  Administered 2021-03-19: 1000 mL via INTRAVENOUS

## 2021-03-19 MED ORDER — SUGAMMADEX SODIUM 200 MG/2ML IV SOLN
INTRAVENOUS | Status: DC | PRN
  Administered 2021-03-19: 400 mg via INTRAVENOUS

## 2021-03-19 MED ORDER — DEXAMETHASONE SODIUM PHOSPHATE 10 MG/ML IJ SOLN
INTRAMUSCULAR | Status: DC | PRN
  Administered 2021-03-19: 10 mg via INTRAVENOUS

## 2021-03-19 MED ORDER — PROPOFOL 10 MG/ML IV BOLUS
INTRAVENOUS | Status: DC | PRN
  Administered 2021-03-19: 160 mg via INTRAVENOUS

## 2021-03-19 MED ORDER — BUTAMBEN-TETRACAINE-BENZOCAINE 2-2-14 % EX AERO: 1.0000 | INHALATION_SPRAY | Freq: Once | CUTANEOUS | Status: DC

## 2021-03-19 MED ORDER — ROCURONIUM BROMIDE 10 MG/ML (PF) SYRINGE
PREFILLED_SYRINGE | INTRAVENOUS | Status: DC | PRN
  Administered 2021-03-19: 70 mg via INTRAVENOUS

## 2021-03-19 MED ORDER — HYDROMORPHONE BOLUS VIA INFUSION
1.0000 mg | Freq: Once | INTRAVENOUS | Status: AC
  Administered 2021-03-19: 1 mg via INTRAVENOUS

## 2021-03-19 MED ORDER — PHENYLEPHRINE HCL 0.25 % NA SOLN: 1.0000 | Freq: Four times a day (QID) | NASAL | Status: DC | PRN

## 2021-03-19 MED ORDER — ALBUTEROL SULFATE (2.5 MG/3ML) 0.083% IN NEBU
2.5000 mg | INHALATION_SOLUTION | RESPIRATORY_TRACT | Status: DC | PRN
  Administered 2021-03-19 – 2021-04-02 (×11): 2.5 mg via RESPIRATORY_TRACT
  Filled 2021-03-19 (×11): qty 3

## 2021-03-19 MED ORDER — MIDAZOLAM HCL 2 MG/2ML IJ SOLN
INTRAMUSCULAR | Status: AC
  Filled 2021-03-19: qty 2

## 2021-03-19 MED ORDER — DEXAMETHASONE SODIUM PHOSPHATE 4 MG/ML IJ SOLN
4.0000 mg | Freq: Once | INTRAMUSCULAR | Status: AC
  Administered 2021-03-19: 4 mg via INTRAVENOUS
  Filled 2021-03-19: qty 1

## 2021-03-19 NOTE — Telephone Encounter (Signed)
Pt admitted to [facility] for " alveolar hemorrhage which localizes to the right lung."

## 2021-03-19 NOTE — Progress Notes (Signed)
[facility] Pontes [id number]) Hospital Liaison note:  This is a pending outpatient-based Palliative Care patient. Will continue to follow for disposition.  Please call with any outpatient palliative questions or concerns.  Thank you, Lorelee Market, LPN [facility] Liaison [phone]

## 2021-03-19 NOTE — Progress Notes (Signed)
I spoke with the patient's wife to confirm simulation tomorrow via carelink.

## 2021-03-19 NOTE — Progress Notes (Signed)
PROGRESS NOTE    Christopher Carter  BWI:[id number] DOB: Feb 23, 1961 DOA: 03/18/2021 PCP: [name], MD   Chief Complain: Hemoptysis  Brief Narrative: Patient is a 60 year old male with history of adenocarcinoma of the lung status post right upper lobe lobectomy, chest wall dissection by Dr. Koleen Nimrod on 5/16, hypertension, anxiety, depression who presented with hemoptysis.  He was admitted recently from 6/19-6/29 for hemoptysis, shortness of breath.  At that time he was found to have loculated hemothorax requiring IR placement of a chest tube and empiric antibiotics with prednisone taper.  Chest tube was removed prior to discharge.  He has following with cardiothoracic surgery and radiation oncology.  Patient again developed persistent cough and coughed of blood more frequently than before.  He was also on oxygen at 4 L/min at home which was bumped to 6 L.  Patient was then brought to the emergency department.  On presentation he was hemodynamically stable.  Lab work showed leukocytosis, hemoglobin of 10.8.  CT chest showed airspace disease in the right lung representing alveolar hemorrhage with recurrence of apical fluid collection, nodular masslike lesion concerning for recurrence of malignancy, right peritracheal adenopathy.  PCCM, cardiothoracic surgery consulted.  Current plan is to insert a chest tube as per IR  Assessment & Plan:   Principal Problem:   Intra-alveolar hemorrhage Active Problems:   Essential hypertension   Malignant neoplasm of right upper lobe of lung (HCC)   Major depressive disorder   Uncontrolled pain   Leukocytosis   Normocytic anemia   Tooth abscess   Thrombocytosis   Intra-alveolar hemorrhage: Has history of recurrent hematemesis, recently admitted in June for the same, at that time chest tube was placed and was removed on discharge.  He was following with cardiothoracic surgery and radiation oncology. Presented with recurrent hemoptysis, increment   in the oxygen requirement.  Possibility of recurrence of malignancy. Continue to monitor respiratory status.  IR consulted for chest tube placement for drains with plan to send fluid for cytology/culture.  Cardiothoracic surgery, PCCM also consulted. Also underwent bronchoscopy today by PCCM without finding of any active bleeding.  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 Koleen Nimrod on 5/16 Radiation oncology planning to start radiation therapy tomorrow.  Chronic respiratory failure with hypoxia: On 4 L of oxygen per minute at home.Now at baseline again  Uncontrolled pain: Currently on Dilaudid pump.  Palliative care consulted for pain control.  Leukocytosis: Elevated WBC at 18.2 on admission.  Most likely multifactorial causes.  Continue to monitor.  Started on broad-spectrum antibiotics.  Will DC vancomycin if MRSA PCR is negative.  Tooth abscess: He was on on penicillin V that was started as an outpatient.  Plan for root canal treatment on 7/11 this needs to be postponed.  Normocytic anemia: Currently hemoglobin around baseline.  Monitor H&H  Hypertension: Currently blood pressure stable.  Continue home amlodipine, irbesartan.  History of anxiety/depression: On Seroquel, Zoloft  GERD:On  Protonix           DVT prophylaxis:SCD Code Status: Full Family Communication: None at bedside Status is: Inpatient  Remains inpatient appropriate because:Inpatient level of care appropriate due to severity of illness  Dispo: The patient is from: Home              Anticipated d/c is to: Home              Patient currently is not medically stable to  d/c.   Difficult to place patient No     Consultants: PCCM, IR, cardiothoracic surgery  Procedures: Bronchoscopy,Plan for chest tube placement  Antimicrobials:  Anti-infectives (From admission, onward)    Start     Dose/Rate Route  Frequency Ordered Stop   03/19/21 0900  vancomycin (VANCOREADY) IVPB 1250 mg/250 mL        1,250 mg 166.7 mL/hr over 90 Minutes Intravenous Every 12 hours 03/18/21 1842     03/18/21 2200  ceFEPIme (MAXIPIME) 2 g in sodium chloride 0.9 % 100 mL IVPB        2 g 200 mL/hr over 30 Minutes Intravenous Every 8 hours 03/18/21 1836     03/18/21 1930  vancomycin (VANCOREADY) IVPB 1500 mg/300 mL        1,500 mg 150 mL/hr over 120 Minutes Intravenous  Once 03/18/21 1836 03/19/21 0734   03/18/21 1915  azithromycin (ZITHROMAX) 500 mg in sodium chloride 0.9 % 250 mL IVPB        500 mg 250 mL/hr over 60 Minutes Intravenous Every 24 hours 03/18/21 1822     03/18/21 1800  penicillin v potassium (VEETID) tablet 500 mg  Status:  Discontinued       Note to Pharmacy: Start date : 03/14/21     500 mg Oral 4 times daily 03/18/21 1527 03/18/21 1842       Subjective:  Patient seen and examined at the bedside this morning.  Hemodynamically stable.  On Dilaudid pump.  Looks comfortable when I entered the room.  Pain well controlled.  Denies any worsening shortness of breath or active hemoptysis.  On 4 to 5 L of oxygen per minute.  Objective: Vitals:   03/19/21 0039 03/19/21 0109 03/19/21 0531 03/19/21 0759  BP: 126/66  135/78 (!) 144/62  Pulse: 91  93   Resp: 20  18   Temp:   98.2 F (36.8 C) 98.4 F (36.9 C)  TempSrc:   Oral   SpO2: 97% 97% 93%   Weight:      Height:        Intake/Output Summary (Last 24 hours) at 03/19/2021 0829 Last data filed at 03/18/2021 1246 Gross per 24 hour  Intake 480 ml  Output 400 ml  Net 80 ml   Filed Weights   03/18/21 0240 03/18/21 0242  Weight: 78.9 kg 79.4 kg    Examination:  General exam: Appears calm and comfortable ,Not in distress,average built HEENT:PERRL,Oral mucosa moist, Ear/Nose normal on gross exam Respiratory system: Bilateral mildly diminished air entry, no crackles  cardiovascular system: S1 & S2 heard, RRR. No JVD, murmurs, rubs, gallops or  clicks. No pedal edema. Gastrointestinal system: Abdomen is nondistended, soft and nontender. No organomegaly or masses felt. Normal bowel sounds heard. Central nervous system: Alert and oriented. No focal neurological deficits. Extremities: No edema, no clubbing ,no cyanosis Skin: No rashes, lesions or ulcers,no icterus ,no pallor   Data Reviewed: I have personally reviewed following labs and imaging studies  CBC: Recent Labs  Lab 03/15/21 1254 03/18/21 0246 03/18/21 1551 03/18/21 2208 03/19/21 0215  WBC 30.0* 18.1*  --   --  19.4*  NEUTROABS 25.5* 13.2*  --   --   --   HGB 11.5* 10.8* 9.3* 9.8* 9.4*  HCT 35.7* 33.1* 29.0* 29.9* 28.8*  MCV 93.9 90.9  --   --  92.3  PLT 574* 405*  --   --  825   Basic Metabolic Panel: Recent Labs  Lab 03/15/21 1254 03/18/21 0246  03/19/21 0215  NA 133* 134* 130*  K 4.5 4.0 4.2  CL 96* 98 95*  CO2 27 26 27   GLUCOSE 164* 172* 158*  BUN 7 13 8   CREATININE 0.68 0.50* 0.51*  CALCIUM 9.3 9.0 8.5*   GFR: Estimated Creatinine Clearance: 103.9 mL/min (A) (by C-G formula based on SCr of 0.51 mg/dL (L)). Liver Function Tests: Recent Labs  Lab 03/15/21 1254  AST 12*  ALT 36  ALKPHOS 94  BILITOT 0.8  PROT 6.7  ALBUMIN 3.1*   No results for input(s): LIPASE, AMYLASE in the last 168 hours. No results for input(s): AMMONIA in the last 168 hours. Coagulation Profile: Recent Labs  Lab 03/18/21 0246 03/18/21 1609 03/19/21 0215  INR 1.0 1.0 1.1   Cardiac Enzymes: No results for input(s): CKTOTAL, CKMB, CKMBINDEX, TROPONINI in the last 168 hours. BNP (last 3 results) No results for input(s): PROBNP in the last 8760 hours. HbA1C: No results for input(s): HGBA1C in the last 72 hours. CBG: No results for input(s): GLUCAP in the last 168 hours. Lipid Profile: No results for input(s): CHOL, HDL, LDLCALC, TRIG, CHOLHDL, LDLDIRECT in the last 72 hours. Thyroid Function Tests: No results for input(s): TSH, T4TOTAL, FREET4, T3FREE, THYROIDAB  in the last 72 hours. Anemia Panel: No results for input(s): VITAMINB12, FOLATE, FERRITIN, TIBC, IRON, RETICCTPCT in the last 72 hours. Sepsis Labs: Recent Labs  Lab 03/15/21 1254 03/18/21 1551 03/19/21 0215  PROCALCITON  --  <0.10 0.12  LATICACIDVEN 1.7  --   --     Recent Results (from the past 240 hour(s))  Resp Panel by RT-PCR (Flu A&B, Covid) Nasopharyngeal Swab     Status: None   Collection Time: 03/18/21  2:57 AM   Specimen: Nasopharyngeal Swab; Nasopharyngeal(NP) swabs in vial transport medium  Result Value Ref Range Status   SARS Coronavirus 2 by RT PCR NEGATIVE NEGATIVE Final    Comment: (NOTE) SARS-CoV-2 target nucleic acids are NOT DETECTED.  The SARS-CoV-2 RNA is generally detectable in upper respiratory specimens during the acute phase of infection. The lowest concentration of SARS-CoV-2 viral copies this assay can detect is 138 copies/mL. A negative result does not preclude SARS-Cov-2 infection and should not be used as the sole basis for treatment or other patient management decisions. A negative result may occur with  improper specimen collection/handling, submission of specimen other than nasopharyngeal swab, presence of viral mutation(s) within the areas targeted by this assay, and inadequate number of viral copies(<138 copies/mL). A negative result must be combined with clinical observations, patient history, and epidemiological information. The expected result is Negative.  Fact Sheet for Patients:  EntrepreneurPulse.com.au  Fact Sheet for Healthcare Providers:  IncredibleEmployment.be  This test is no t yet approved or cleared by the [place] FDA and  has been authorized for detection and/or diagnosis of SARS-CoV-2 by FDA under an Emergency Use Authorization (EUA). This EUA will remain  in effect (meaning this test can be used) for the duration of the COVID-19 declaration under Section 564(b)(1) of the Act,  21 U.S.C.section 360bbb-3(b)(1), unless the authorization is terminated  or revoked sooner.       Influenza A by PCR NEGATIVE NEGATIVE Final   Influenza B by PCR NEGATIVE NEGATIVE Final    Comment: (NOTE) The Xpert Xpress SARS-CoV-2/FLU/RSV plus assay is intended as an aid in the diagnosis of influenza from Nasopharyngeal swab specimens and should not be used as a sole basis for treatment. Nasal washings and aspirates are unacceptable for Xpert Xpress SARS-CoV-2/FLU/RSV  testing.  Fact Sheet for Patients: EntrepreneurPulse.com.au  Fact Sheet for Healthcare Providers: IncredibleEmployment.be  This test is not yet approved or cleared by the [place] FDA and has been authorized for detection and/or diagnosis of SARS-CoV-2 by FDA under an Emergency Use Authorization (EUA). This EUA will remain in effect (meaning this test can be used) for the duration of the COVID-19 declaration under Section 564(b)(1) of the Act, 21 U.S.C. section 360bbb-3(b)(1), unless the authorization is terminated or revoked.  Performed at [facility], [address], [place], [place] [address]          Radiology Studies: CT Chest W Contrast  Result Date: 03/18/2021 CLINICAL DATA:  Hemoptysis.  Recent diagnosis of lung cancer EXAM: CT CHEST WITH CONTRAST TECHNIQUE: Multidetector CT imaging of the chest was performed during intravenous contrast administration. CONTRAST:  37mL OMNIPAQUE IOHEXOL 300 MG/ML  SOLN COMPARISON:  02/26/2021 FINDINGS: Cardiovascular: Normal heart size. No pericardial effusion. Extensive coronary calcification. No acute vascular finding. Mediastinum/Nodes: Tubular centrally low-density structure along the upper right mediastinum is contiguous with a complex right apical mass and collection. There is right paratracheal nodule measuring 17 mm in diameter, suspicious for metastatic node. Lungs/Pleura: History of right upper  lobectomy for cancer. There is a recently drained and re-accumulated loculated collection at the right apex lined by thick masslike nodularity, up to 10 cm on axial slices. Small right pleural effusion at the base which appears more simple and dependent. Airspace opacity in the right lung and to a much lesser extent in the left lung. No discrete cavity or airway mass. Upper Abdomen: Negative Musculoskeletal: The right apical mass is in close continuity with the upper ribs but no erosion is detected. No hematogenous osseous metastatic disease noted either. IMPRESSION: 1. 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 2. The recurrent right apical fluid collection is lined by masslike nodularity most consistent with recurrence. Recommend histologic correlation. 3. Right paratracheal adenopathy. 4. Small right pleural effusion. Electronically Signed   By: Monte Fantasia M.D.   On: 03/18/2021 04:49   DG Chest Portable 1 View  Result Date: 03/18/2021 CLINICAL DATA:  Hemoptysis for several hours, history of known lung carcinoma EXAM: PORTABLE CHEST 1 VIEW COMPARISON:  03/15/2021 FINDINGS: Cardiac shadow is within normal limits. Postsurgical changes are noted in the right apex with persistent soft tissue density in the apex stable from the prior exam. The previously seen right mid lung airspace opacity has resolved in the interval. Left lung remains clear. No bony abnormality is noted. IMPRESSION: Clearing of previously seen right mid lung airspace opacity. Remainder of the exam is stable from the prior study. Electronically Signed   By: Inez Catalina M.D.   On: 03/18/2021 03:18        Scheduled Meds:  amLODipine  10 mg Oral Daily   gabapentin  300 mg Oral BID WC   gabapentin  600 mg Oral QHS   HYDROmorphone   Intravenous Q4H   irbesartan  300 mg Oral Daily   latanoprost  1 drop Both Eyes QHS   lidocaine  1 patch Transdermal Q24H   pantoprazole  40 mg Oral Daily    predniSONE  10 mg Oral Q breakfast   QUEtiapine  100 mg Oral QHS   sertraline  100 mg Oral Daily   sodium chloride flush  3 mL Intravenous Q12H   tranexamic acid  500 mg Nebulization Once   Continuous Infusions:  azithromycin 500 mg (03/18/21 2355)  ceFEPime (MAXIPIME) IV 2 g (03/19/21 0646)   vancomycin 1,250 mg (03/19/21 0819)     LOS: 1 day    Time spent:35 mins. More than 50% of that time was spent in counseling and/or coordination of care.      Shelly Coss, MD [facility] P7/07/2021, 8:29 AM

## 2021-03-19 NOTE — Procedures (Signed)
Video Bronchoscopy Procedure Note  Date of Operation: 03/19/2021  Pre-op Diagnosis: hemoptysis  Post-op Diagnosis: Same  Surgeon: [name], DO  Assistants: none  Anesthesia: general  Meds Given: per anesthesia records  Operation: Flexible video fiberoptic bronchoscopy and biopsies  Estimated Blood Loss: <7CW  Complications: none noted  Indications and History: Christopher Carter is 60 y.o. with history of lung adenocarcinoma  of R lung and hemoptysis.  Recommendation was to perform video fiberoptic bronchoscopy for localization of bleeding. The risks, benefits, complications, treatment options and expected outcomes were discussed with the patient.  The possibilities of pneumothorax, pneumonia, reaction to medication, pulmonary aspiration, perforation of a viscus, bleeding, failure to diagnose a condition and creating a complication requiring transfusion or operation were discussed with the patient who freely signed the consent.    Description of Procedure: The patient was seen in the Preoperative Area, was examined and was deemed appropriate to proceed.  The patient was taken to endoscopy, identified as [name] and the procedure verified as Flexible Video Fiberoptic Bronchoscopy.  A Time Out was held and the above information confirmed.   General anesthesia was initiated as indicated above. The video fiberoptic bronchoscope was introduced via the ETT and a general inspection was performed which showed sharp carina, petechiae in multiple airways including distal trachea, L and right mainstem bronchi (pictures in paper chart), and several subsegments, and frothy blood-tinged secretions throughout. When these secretions were washed out with a few mL of saline, there was no recurrent bleeding despite waiting several minutes for recurrence of bloody secretions in the airways. There were no visible staples at the site of the RUL stump, and the mucosa had healed well. No signs  of bleeding there.   The scope was withdrawn and the patient was taken to the PACU.   Samples: none  Plans:  Con't care as described in progress notes.  [name], DO 03/19/21 12:50 PM [facility]

## 2021-03-19 NOTE — Consult Note (Signed)
Consultation Note Date: 03/19/2021   Patient Name: Christopher Carter  DOB: 11-14-1960  MRN: [id number]  Age / Sex: 60 y.o., male  PCP: [name], MD Referring Physician: Shelly Coss, MD  Reason for Consultation: Establishing goals of care and Pain control  HPI/Patient Profile: 60 y.o. 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 Koleen Nimrod on 5/16, hypertension, anxiety, and depression admitted on 03/18/2021 with hemoptysis, shortness of breath, and uncontrolled cancer-related pain.  Palliative medicine has been consulted to assist with pain management and goals of care conversation.  Clinical Assessment and Goals of Care:  I have reviewed medical records including EPIC notes, labs and imaging, received report from RN, assessed the patient and then met at the bedside along with [name] and his friend Eulas Post to discuss diagnosis, prognosis, GOC, EOL wishes, disposition and options.  I introduced Palliative Medicine as specialized medical care for people living with serious illness. It focuses on providing relief from the symptoms and stress of a serious illness. The goal is to improve quality of life for both the patient and the family.  We discussed a brief life review of the patient and then focused on their current illness. Christopher Carter has been married to Christopher Carter for 10 years and they share a Psychiatrist and step-granddaughter together. His hobbies and interests include hiking, biking, going to the gym, and working on his truck. He has worked as an Medical sales representative, although has been out of work for the past couple of months due to his current illness. Since his cancer diagnosis at the end of 12/2020, Christopher Carter has gradually slowed down his walking due to pain and difficulty breathing. He does not need a  walker. He is still able to bath, dress, and cook for himself. 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 as previously.    I attempted to elicit values and goals of care important to the patient.   Christopher Carter values his time with family and his quality of life. His pain has been gradually worsening to the point that he has wished for death at times. He denies current suicidal ideation or ever making a plan. Last night was the worse pain he has ever experienced. He reports 6-7/10 pain at baseline and more than 10/10 at its worse. Pain is in the right upper back and radiates to his neck and right arm. It begins as a dull ache and develops into a hot knife sensation. If he does not take medication, it takes less than an hour for pain to severely worsen. Pain medications provide relief for 5-10 minutes. He has not slept more than 2 hours in several days due to this pain. Christopher Carter has tried gabapentin, Toradol, prednisone, fentanyl, and dilaudid in the past. He has experienced the most relief from dilaudid PCA and states that it is a relief to know that he has the option of bolus doses  without waiting for assistance. Movements, coughing, and anticipation/anxiety all worsen his pain. Discussed risks and benefits of various options for improving pain management.   Palliative Care services outpatient were explained and offered.  Discussed the importance of continued conversation with family and the medical providers regarding overall plan of care and treatment options, ensuring decisions are within the context of the patient's values and GOCs.    Questions and concerns were addressed. The family was encouraged to call with questions or concerns.  PMT will continue to support holistically.   PATIENT is the primary decision maker. Next of kin is his wife, Christopher Carter.    SUMMARY OF RECOMMENDATIONS   -Full code/full scope -Add $RemoveBe'1mg'TISNxCehm$ /hr basal rate to Dilaudid PCA -Order $RemoveBe'4mg'uqkBpVnar$  IV decadron  x1 -Ongoing pain management and discussions of additional such as methadone, given risk of hyperalgesia with high doses of dilaudid  -Psychosocial and emotional support provided -Ongoing support from [place]: Full code  Symptom Management:  As above  Palliative Prophylaxis:  Bowel Regimen and Frequent Pain Assessment  Additional Recommendations (Limitations, Scope, Preferences): Full Scope Treatment  Psycho-social/Spiritual:  Desire for further Chaplaincy support:tbd Additional Recommendations: Caregiving  Support/Resources and Referral to Community Resources   Prognosis:  Unable to determine  Discharge Planning: To Be Determined      Primary Diagnoses: Present on Admission:  Intra-alveolar hemorrhage  Uncontrolled pain  Leukocytosis  Normocytic anemia  Tooth abscess  Malignant neoplasm of right upper lobe of lung (HCC)  Essential hypertension  Major depressive disorder  Thrombocytosis   I have reviewed the medical record, interviewed the patient and family, and examined the patient. The following aspects are pertinent.  Past Medical History:  Diagnosis Date   Anxiety    Cancer ([place]) 12/2020   Depression    Hypertension    Pneumonia    1990's   Social History   Socioeconomic History   Marital status: Married    Spouse name: Not on file   Number of children: Not on file   Years of education: Not on file   Highest education level: Not on file  Occupational History   Not on file  Tobacco Use   Smoking status: Never   Smokeless tobacco: Never  Vaping Use   Vaping Use: Never used  Substance and Sexual Activity   Alcohol use: Yes    Alcohol/week: 22.0 standard drinks    Types: 1 Cans of beer, 21 Shots of liquor per week    Comment: 3-4 shots of whiskey a day   Drug use: No   Sexual activity: Not on file  Other Topics Concern   Not on file  Social History Narrative   Not on file   Social Determinants of Health    Financial Resource Strain: Not on file  Food Insecurity: Not on file  Transportation Needs: Not on file  Physical Activity: Not on file  Stress: Not on file  Social Connections: Not on file   History reviewed. No pertinent family history. Scheduled Meds:  amLODipine  10 mg Oral Daily   gabapentin  300 mg Oral BID WC   gabapentin  600 mg Oral QHS   HYDROmorphone   Intravenous Q4H   irbesartan  300 mg Oral Daily   latanoprost  1 drop Both Eyes QHS   lidocaine  1 patch Transdermal Q24H   midazolam       pantoprazole  40 mg Oral Daily   predniSONE  10 mg Oral Q breakfast  QUEtiapine  100 mg Oral QHS   sertraline  100 mg Oral Daily   sodium chloride flush  3 mL Intravenous Q12H   tranexamic acid  500 mg Nebulization Once   Continuous Infusions:  ceFEPime (MAXIPIME) IV 2 g (03/19/21 1430)   tranexamic acid     vancomycin 1,250 mg (03/19/21 0819)   PRN Meds:.acetaminophen, albuterol, diphenhydrAMINE **OR** diphenhydrAMINE, fentaNYL (SUBLIMAZE) injection, naloxone **AND** sodium chloride flush, ondansetron **OR** ondansetron (ZOFRAN) IV, ondansetron (ZOFRAN) IV, polyethylene glycol Medications Prior to Admission:  Prior to Admission medications   Medication Sig Start Date End Date Taking? Authorizing Provider  acetaminophen (TYLENOL) 500 MG tablet Take 1,000 mg by mouth every 8 (eight) hours as needed for moderate pain or headache.   Yes [provider]  Ascorbic Acid (VITAMIN C) 1000 MG tablet Take 1,000 mg by mouth daily.   Yes [provider]  gabapentin (NEURONTIN) 300 MG capsule Take 300-600 mg by mouth See admin instructions. Takes 300 mg in the morning and afternoon and 600 mg at night 03/12/21  Yes [provider]  HYDROmorphone (DILAUDID) 2 MG tablet Take 1-2 tablets (2-4 mg total) by mouth every 4 (four) hours as needed for severe pain. Patient taking differently: Take 4 mg by mouth every 4 (four) hours as needed for severe pain. 03/13/21  Yes Conte,  Tessa N, PA-C  irbesartan (AVAPRO) 300 MG tablet Take 300 mg by mouth daily.   Yes [provider]  latanoprost (XALATAN) 0.005 % ophthalmic solution Place 1 drop into both eyes at bedtime.   Yes [provider]  lidocaine (LIDODERM) 5 % Place 1 patch onto the skin daily. Remove & Discard patch within 12 hours or as directed by MD 03/07/21  Yes Regalado, Belkys A, MD  pantoprazole (PROTONIX) 40 MG tablet Take 1 tablet (40 mg total) by mouth daily. 03/08/21  Yes Regalado, Belkys A, MD  penicillin v potassium (VEETID) 500 MG tablet Take 500 mg by mouth 4 (four) times daily. Start date : 03/14/21 03/14/21  Yes [provider]  polyethylene glycol (MIRALAX / GLYCOLAX) 17 g packet Take 17 g by mouth daily as needed for mild constipation. 03/07/21  Yes Regalado, Belkys A, MD  potassium chloride SA (KLOR-CON) 20 MEQ tablet Take 1 tablet (20 mEq total) by mouth daily. 03/07/21  Yes Regalado, Belkys A, MD  predniSONE (DELTASONE) 20 MG tablet Take 40 mg daily for 5 days, then 20 mg daily for 5 days then 10 mg daily for 5 days then stop. Patient taking differently: Take 20 mg by mouth daily with breakfast. Take 40 mg daily for 5 days, then 20 mg daily for 5 days then 10 mg daily for 5 days then stop. 03/07/21  Yes Regalado, Belkys A, MD  QUEtiapine (SEROQUEL) 50 MG tablet Take 100 mg by mouth at bedtime. 11/06/20  Yes [provider]  sertraline (ZOLOFT) 100 MG tablet Take 100 mg by mouth daily. 10/29/20  Yes [provider]  vitamin B-12 (CYANOCOBALAMIN) 1000 MCG tablet Take 1,000 mcg by mouth daily.   Yes [provider]  amLODipine (NORVASC) 10 MG tablet TAKE 1 TABLET BY MOUTH EVERY DAY 03/19/21   [name], MD  furosemide (LASIX) 20 MG tablet Take 1 tablet (20 mg total) by mouth daily. Patient not taking: No sig reported 03/07/21 03/18/21  Regalado, Jerald Kief A, MD   Allergies  Allergen Reactions   Oxycodone Hcl Other (See Comments)    Pt reported  "seeing things" and "  feeling unusual."   Bupropion Nausea And Vomiting   Review of Systems  Respiratory:  Positive for cough.   Musculoskeletal:  Positive for back pain and neck pain.  Psychiatric/Behavioral:  The patient is nervous/anxious.   All other systems reviewed and are negative.  Physical Exam Vitals and nursing note reviewed.  Constitutional:      Appearance: He is ill-appearing.     Interventions: Nasal cannula in place.     Comments: 6L  Cardiovascular:     Rate and Rhythm: Regular rhythm. Tachycardia present.  Pulmonary:     Effort: Pulmonary effort is normal.  Skin:    General: Skin is warm and dry.  Neurological:     Mental Status: He is alert and oriented to person, place, and time.  Psychiatric:        Mood and Affect: Mood normal.        Behavior: Behavior normal.    Vital Signs: BP 107/61   Pulse (!) 111   Temp (!) 97.5 F (36.4 C) (Temporal)   Resp 16   Ht $R'5\' 10"'vd$  (1.778 m)   Wt 79.4 kg   SpO2 94%   BMI 25.11 kg/m  Pain Scale: 0-10 POSS *See Group Information*: 1-Acceptable,Awake and alert Pain Score: 8    SpO2: SpO2: 94 % O2 Device:SpO2: 94 % O2 Flow Rate: .O2 Flow Rate (L/min): 5 L/min  IO: Intake/output summary:  Intake/Output Summary (Last 24 hours) at 03/19/2021 1511 Last data filed at 03/19/2021 1232 Gross per 24 hour  Intake 660 ml  Output 605 ml  Net 55 ml    LBM: Last BM Date: 03/18/21 Baseline Weight: Weight: 78.9 kg Most recent weight: Weight: 79.4 kg     Palliative Assessment/Data: 60%    Time In: 1:00pm Time Out: 2:15pm Time Total: 75 minutes Greater than 50% of this time was spent in counseling and coordinating care related to the above assessment and plan.  Dorthy Cooler, PA-C Palliative Medicine Team Team phone # [phone]  Thank you for allowing the Palliative Medicine Team to assist in the care of this patient. Please utilize secure chat with additional questions, if there is no response within 30  minutes please call the above phone number.  Palliative Medicine Team providers are available by phone from 7am to 7pm daily and can be reached through the team cell phone.  Should this patient require assistance outside of these hours, please call the patient's attending physician.

## 2021-03-19 NOTE — Progress Notes (Addendum)
Interventional Radiology Progress Note   Discussed possible CT guided right chest tube with Dr. Kipp Brood.    Will hold off for time being.  Available if needed.   Signed,  Dulcy Fanny. Earleen Newport, DO

## 2021-03-19 NOTE — Interval H&P Note (Signed)
History and Physical Interval Note:  03/19/2021 11:27 AM  Christopher Carter  has presented today for surgery, with the diagnosis of hemoptysis.  The various methods of treatment have been discussed with the patient and family. After consideration of risks, benefits and other options for treatment, the patient has consented to  Procedure(s) with comments: VIDEO BRONCHOSCOPY WITHOUT FLUORO (N/A) - need cryo probe available as a surgical intervention.  The patient's history has been reviewed, patient examined, no change in status, stable for surgery.  I have reviewed the patient's chart and labs.  Questions were answered to the patient's satisfaction.    Examined in pre-op. Still having hemoptysis. Exam unchanged. Consent signed and all questions have been answered.  [name]

## 2021-03-19 NOTE — Anesthesia Procedure Notes (Signed)
Procedure Name: Intubation Date/Time: 03/19/2021 12:10 PM Performed by: Thelma Comp, CRNA Pre-anesthesia Checklist: Patient identified, Emergency Drugs available, Suction available and Patient being monitored Patient Re-evaluated:Patient Re-evaluated prior to induction Oxygen Delivery Method: Circle System Utilized Preoxygenation: Pre-oxygenation with 100% oxygen Induction Type: IV induction Ventilation: Mask ventilation without difficulty Laryngoscope Size: Mac and 4 Grade View: Grade II Tube type: Oral Tube size: 8.5 mm Number of attempts: 1 Airway Equipment and Method: Stylet Placement Confirmation: ETT inserted through vocal cords under direct vision, positive ETCO2 and breath sounds checked- equal and bilateral Secured at: 23 cm Tube secured with: Tape Dental Injury: Teeth and Oropharynx as per pre-operative assessment

## 2021-03-19 NOTE — Progress Notes (Signed)
Pt receiving another PRN neb tx due to SOB.

## 2021-03-19 NOTE — H&P (View-Only) (Signed)
NAME:  Christopher Carter, MRN:  [id number], DOB:  1960-09-26, LOS: 1 ADMISSION DATE:  03/18/2021, CONSULTATION DATE:  03/18/2021 REFERRING MD:  Dr. Tamala Julian, CHIEF COMPLAINT:  Hemoptysis    History of Present Illness:  Christopher Carter is a 60 y.o. male with a PMH significant for Stage IIIa adenocarcinoma now S/P right upper lobectomy with node dissection 01/22/2021, on 5L [place] at baseline, tobacco abuse, HTN, and depression who presented to [facility] for recurrent hemoptysis. Patient has been treated at this facility for similar presentation and underwent video bronchoscopy and IR placed chest tube for pleural effusion with Dr. Roxan Hockey. He was discharged in stable condition with plans to follow up with cardiothoracic surgery and oncology.  He presented this admission with recurrent hemoptysis that started 4hr prior to admission with associated right upper back pain that radiates to upper neck. Patient was transferred from [place] to [facility] for cardiothoracic, pulmonary, and IR consults. Vital signs stable, WBC elevated at 18.1. CT chest was obtained and consistent with alveolar hemorrhage localized to the right with right apical fluid collection.   Pertinent  Medical History  Stage IIIa adenocarcinoma now S/P right upper lobectomy with node dissection 01/22/2021, on 5L [place] at baseline, tobacco abuse, HTN, and depression   Significant Hospital Events:  7/10 admitted with recurrent hemoptysis   Interim History / Subjective:  Overnight his pain was uncontrolled and he required a PCA to be started. His pain has come down to a controllable level again, but he continues to have significant worsening of his pain with coughing.  Objective   Blood pressure (!) 144/62, pulse 93, temperature 98.4 F (36.9 C), resp. rate 18, height 5\' 10"  (1.778 m), weight 79.4 kg, SpO2 93 %.        Intake/Output Summary (Last 24 hours) at 03/19/2021 0856 Last data filed at 03/18/2021 1246 Gross per  24 hour  Intake 480 ml  Output 400 ml  Net 80 ml    Filed Weights   03/18/21 0240 03/18/21 0242  Weight: 78.9 kg 79.4 kg    Examination: General: Ill-appearing man lying in bed in no acute distress HEENT: [place]/AT, eyes anicteric Cardiac: S1-S2, regular rate and rhythm Respiratory: Frequent coughing, wet sounding.  Rhonchi diffusely bilaterally.  No accessory muscle use. Abdomen: Nondistended, soft Extremities: Clubbing, no cyanosis Skin: Warm, dry, no rashes Neuro: awake, moving extremities spontaneously, face symmetric.  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  This is not typical post-thoracotomy pain. Acute on chronic anemia -Appreciate cardiothoracic surgery's input.  Agree with broad-spectrum antibiotics and chest tube placement today.  Need culture, cell count staph, cytology on fluid. - Continue TXA nebs 3 times daily - Continue breathing treatments as needed.  Adding albuterol every 2 as needed for breakthrough. - Continue supplemental oxygen as required to maintain SPO2 greater than 90%. - Agree with PCA as his pain remained uncontrolled, which will have implications on pulmonary hygiene.  Appreciate assistance from palliative care on pain regimen. -Transfuse for hemoglobin less than 7 or hemodynamically significant bleeding.  Type and screen previously sent. -Continue holding chemical DVT prophylaxis -Exact source of bleeding still not known.  CT contrast did not  demonstrate a focal source of bleeding.  If bleeding escalates may require bronchoscopy or additional evaluation to localize source.  Tooth abscess  -hold PCN while on cefepime   -will need root canal eventually      Patient updated at bedside. Wife not present at bedside this morning.  Best Practice   Diet/type: Regular consistency (see orders) DVT prophylaxis: SCD GI prophylaxis: N/A Lines: N/A Foley:  N/A Code Status:  full code Last date of multidisciplinary goals of care discussion: Per primary   Labs   CBC: Recent Labs  Lab 03/15/21 1254 03/18/21 0246 03/18/21 1551 03/18/21 2208 03/19/21 0215  WBC 30.0* 18.1*  --   --  19.4*  NEUTROABS 25.5* 13.2*  --   --   --   HGB 11.5* 10.8* 9.3* 9.8* 9.4*  HCT 35.7* 33.1* 29.0* 29.9* 28.8*  MCV 93.9 90.9  --   --  92.3  PLT 574* 405*  --   --  352     Basic Metabolic Panel: Recent Labs  Lab 03/15/21 1254 03/18/21 0246 03/19/21 0215  NA 133* 134* 130*  K 4.5 4.0 4.2  CL 96* 98 95*  CO2 27 26 27   GLUCOSE 164* 172* 158*  BUN 7 13 8   CREATININE 0.68 0.50* 0.51*  CALCIUM 9.3 9.0 8.5*    GFR: Estimated Creatinine Clearance: 103.9 mL/min (A) (by C-G formula based on SCr of 0.51 mg/dL (L)). Recent Labs  Lab 03/15/21 1254 03/18/21 0246 03/18/21 1551 03/19/21 0215  PROCALCITON  --   --  <0.10 0.12  WBC 30.0* 18.1*  --  19.4*  LATICACIDVEN 1.7  --   --   --       [name], DO 03/19/21 9:07 AM [facility]

## 2021-03-19 NOTE — Progress Notes (Signed)
Planning on radiation tomorrow morning at [facility]. Per RT department and [facility], he can remain on his PCA pump during this trip. Per RT, no concerns about root canal that has not yet been completed for this radiation field.  [name], DO 03/19/21 1:27 PM [facility]

## 2021-03-19 NOTE — Progress Notes (Signed)
NAME:  Christopher Carter, MRN:  [id number], DOB:  12-07-1960, LOS: 1 ADMISSION DATE:  03/18/2021, CONSULTATION DATE:  03/18/2021 REFERRING MD:  Dr. Tamala Julian, CHIEF COMPLAINT:  Hemoptysis    History of Present Illness:  Christopher Carter is a 60 y.o. 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 Patient has been treated at this facility for similar presentation and underwent video bronchoscopy and IR placed chest tube for pleural effusion with Dr. Roxan Hockey. He was discharged in stable condition with plans to follow up with cardiothoracic surgery and oncology.  He presented this admission with recurrent hemoptysis that started 4hr prior to admission with associated right upper back pain that radiates to upper neck. Patient was transferred from [place] to [facility] for cardiothoracic, pulmonary, and IR consults. Vital signs stable, WBC elevated at 18.1. CT chest was obtained and consistent with alveolar hemorrhage localized to the right with right apical fluid collection.   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 His pain has come down to a controllable level again, but he continues to have significant worsening of his pain with coughing.  Objective   Blood pressure (!) 144/62, pulse 93, temperature 98.4 F (36.9 C), resp. rate 18, height 5\' 10"  (1.778 m), weight 79.4 kg, SpO2 93 %.        Intake/Output Summary (Last 24 hours) at 03/19/2021 0856 Last data filed at 03/18/2021 1246 Gross per  24 hour  Intake 480 ml  Output 400 ml  Net 80 ml    Filed Weights   03/18/21 0240 03/18/21 0242  Weight: 78.9 kg 79.4 kg    Examination: General: Ill-appearing man lying in bed in no acute distress HEENT: /AT, eyes anicteric Cardiac: S1-S2, regular rate and rhythm Respiratory: Frequent coughing, wet sounding.  Rhonchi diffusely bilaterally.  No accessory muscle use. Abdomen: Nondistended, soft Extremities: Clubbing, no cyanosis Skin: Warm, dry, no rashes Neuro: awake, moving extremities spontaneously, face symmetric.  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  This is not typical post-thoracotomy pain. Acute on chronic anemia -Appreciate cardiothoracic surgery's input.  Agree with broad-spectrum antibiotics and chest tube placement today.  Need culture, cell count staph, cytology on fluid. - Continue TXA nebs 3 times daily - Continue breathing treatments as needed.  Adding albuterol every 2 as needed for breakthrough. - Continue supplemental oxygen as required to maintain SPO2 greater than 90%. - Agree with PCA as his pain remained uncontrolled, which will have implications on pulmonary hygiene.  Appreciate assistance from palliative care on pain regimen. -Transfuse for hemoglobin less than 7 or hemodynamically significant bleeding.  Type and screen previously sent. -Continue holding chemical DVT prophylaxis -Exact source of bleeding still not known.  CT contrast did not  demonstrate a focal source of bleeding.  If bleeding escalates may require bronchoscopy or additional evaluation to localize source.  Tooth abscess  -hold PCN while on cefepime   -will need root canal eventually      Patient updated at bedside. Wife not present at bedside this morning.  Best Practice   Diet/type: Regular consistency (see orders) DVT prophylaxis: SCD GI prophylaxis: N/A Lines: N/A Foley:  N/A Code Status:  full code Last date of multidisciplinary goals of care discussion: Per primary   Labs   CBC: Recent Labs  Lab 03/15/21 1254 03/18/21 0246 03/18/21 1551 03/18/21 2208 03/19/21 0215  WBC 30.0* 18.1*  --   --  19.4*  NEUTROABS 25.5* 13.2*  --   --   --   HGB 11.5* 10.8* 9.3* 9.8* 9.4*  HCT 35.7* 33.1* 29.0* 29.9* 28.8*  MCV 93.9 90.9  --   --  92.3  PLT 574* 405*  --   --  352     Basic Metabolic Panel: Recent Labs  Lab 03/15/21 1254 03/18/21 0246 03/19/21 0215  NA 133* 134* 130*  K 4.5 4.0 4.2  CL 96* 98 95*  CO2 27 26 27   GLUCOSE 164* 172* 158*  BUN 7 13 8   CREATININE 0.68 0.50* 0.51*  CALCIUM 9.3 9.0 8.5*    GFR: Estimated Creatinine Clearance: 103.9 mL/min (A) (by C-G formula based on SCr of 0.51 mg/dL (L)). Recent Labs  Lab 03/15/21 1254 03/18/21 0246 03/18/21 1551 03/19/21 0215  PROCALCITON  --   --  <0.10 0.12  WBC 30.0* 18.1*  --  19.4*  LATICACIDVEN 1.7  --   --   --       [name], DO 03/19/21 9:07 AM [facility]

## 2021-03-19 NOTE — Progress Notes (Signed)
     [address]       [place] [address]             [phone]       Feels better today. Continues to have hemoptysis  Vitals:   03/19/21 0759 03/19/21 1110  BP: (!) 144/62 126/72  Pulse:  97  Resp:  14  Temp: 98.4 F (36.9 C) 98.8 F (37.1 C)  SpO2:  97%   Alert NAD EWOB, wet cough  Images reviewed  Case discussed with Pulm.  Will plan for bronchoscopy, and potential cyrotherapy if bleeding source is identified  Another option would be IR angiogram and embolization of potential bronchial bleeding source  Agree with image guided catheter in right fluid collection  Will discuss potentially starting radiation therapy as an impatient.  Christopher Carter Bary Leriche

## 2021-03-19 NOTE — Anesthesia Preprocedure Evaluation (Signed)
Anesthesia Evaluation  Patient identified by MRN, date of birth, ID band Patient awake    Reviewed: Allergy & Precautions, NPO status , Patient's Chart, lab work & pertinent test results  History of Anesthesia Complications Negative for: history of anesthetic complications  Airway Mallampati: II  TM Distance: >3 FB Neck ROM: Full    Dental  (+) Dental Advisory Given, Teeth Intact   Pulmonary shortness of breath,  Covid-19 Nucleic Acid Test Results Lab Results      Component                Value               Date                      SARSCOV2NAA              NEGATIVE            01/18/2021             RUL ADENOCARCINOMA s/p resection Hemoptysis    + rhonchi  + decreased breath sounds      Cardiovascular hypertension, Pt. on medications (-) angina(-) Past MI and (-) CHF  Rhythm:Regular     Neuro/Psych PSYCHIATRIC DISORDERS Anxiety Depression negative neurological ROS     GI/Hepatic negative GI ROS, Neg liver ROS,   Endo/Other    Renal/GU negative Renal ROSLab Results      Component                Value               Date                      CREATININE               0.51 (L)            03/19/2021           Lab Results      Component                Value               Date                      K                        4.2                 03/19/2021                 Musculoskeletal   Abdominal   Peds  Hematology  (+) Blood dyscrasia, anemia , Lab Results      Component                Value               Date                      WBC                      19.4 (H)            03/19/2021                HGB  9.4 (L)             03/19/2021                HCT                      28.8 (L)            03/19/2021                MCV                      92.3                03/19/2021                PLT                      352                 03/19/2021              Anesthesia Other  Findings  DISCUSSION: Patient is a 60 year old male scheduled for the above procedure. S/p video bronchoscopy with EBUS 01/05/21 for evaluation of RUL lung mass with mediastinal adenopathy (cytology: Malignant cells consistent with non-small cell carcinoma).   History includes never smoker, HTN, lung cancer (RUL mass, + non-small cell carcinoma 01/05/21), SBO due to chronically inflamed Meckel's diverticulum (s/p laparoscopic assisted ileocecectomy and small bowel resection with anastomoses 04/25/17). + COVID-19 ~ 12/20/20 (prescribed Paxlovid).   Surgeon called in KCL for hypokalemia, K 2.9. Will order an ISTAT for the day of surgery to re-check for improvement.   Reproductive/Obstetrics                             Anesthesia Physical Anesthesia Plan  ASA: 3  Anesthesia Plan: General   Post-op Pain Management:    Induction: Intravenous  PONV Risk Score and Plan: 2  Airway Management Planned: Oral ETT  Additional Equipment: None  Intra-op Plan:   Post-operative Plan: Extubation in OR and Possible Post-op intubation/ventilation  Informed Consent: I have reviewed the patients History and Physical, chart, labs and discussed the procedure including the risks, benefits and alternatives for the proposed anesthesia with the patient or authorized representative who has indicated his/her understanding and acceptance.     Dental advisory given  Plan Discussed with: CRNA  Anesthesia Plan Comments:         Anesthesia Quick Evaluation

## 2021-03-19 NOTE — Transfer of Care (Signed)
Immediate Anesthesia Transfer of Care Note  Patient: Christopher Carter  Procedure(s) Performed: VIDEO BRONCHOSCOPY WITHOUT FLUORO  Patient Location: Endoscopy Unit  Anesthesia Type:General  Level of Consciousness: drowsy and patient cooperative  Airway & Oxygen Therapy: Patient Spontanous Breathing and Patient connected to face mask oxygen  Post-op Assessment: Report given to RN and Post -op Vital signs reviewed and stable  Post vital signs: Reviewed and stable  Last Vitals:  Vitals Value Taken Time  BP 106/53 03/19/21 1247  Temp 36.4 C 03/19/21 1249  Pulse 114 03/19/21 1254  Resp 21 03/19/21 1254  SpO2 96 % 03/19/21 1254  Vitals shown include unvalidated device data.  Last Pain:  Vitals:   03/19/21 1249  TempSrc: Temporal  PainSc: 0-No pain      Patients Stated Pain Goal: 2 (51/88/41 6606)  Complications: No notable events documented.

## 2021-03-19 NOTE — Progress Notes (Signed)
Initial Nutrition Assessment  DOCUMENTATION CODES:   Not applicable  INTERVENTION:  Once diet advances: -Ensure Enlive po BID, each supplement provides 350 kcal and 20 grams of protein -MVI with minerals daily  NUTRITION DIAGNOSIS:   Increased nutrient needs related to cancer and cancer related treatments as evidenced by estimated needs.  GOAL:   Patient will meet greater than or equal to 90% of their needs  MONITOR:   PO intake, I & O's, Supplement acceptance, Diet advancement, Labs, Weight trends, Skin  REASON FOR ASSESSMENT:   Malnutrition Screening Tool    ASSESSMENT:   Pt with a PMH significant for stage IIIa adenocarcinoma now s/p R upper lobectomy w/ node dissection 01/22/21 on 5L [place] at baseline, HTN, and depression who presented with recurrent hemoptysis. 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  Pt out of room at time of RD visit x2 attempts.    Per weight readings, pt has experienced a 7.89% weight loss x 2 months, which is significant for time frame. Suspect pt meets criteria for malnutrition; however, unable to diagnose at this time without nutrition-focused physical exam.   No PO intake documented.   UOP: 465ml documented x24 hours  Medications: protonix, deltasone Labs: Na 130 (L), Creatinine 0.51 (L), Hgb 9.4 (L)  NUTRITION - FOCUSED PHYSICAL EXAM: Unable to perform at this time. Will attempt at follow-up.   Diet Order:   Diet Order             Diet NPO time specified  Diet effective now                   EDUCATION NEEDS:   No education needs have been identified at this time  Skin:  Skin Assessment: Reviewed RN Assessment  Last BM:  PTA  Height:   Ht Readings from Last 1 Encounters:  03/18/21 5\' 10"  (1.778 m)    Weight:   Wt Readings from Last 1 Encounters:  03/18/21 79.4 kg    BMI:  Body mass index is 25.11 kg/m.  Estimated Nutritional Needs:   Kcal:   2400-2600  Protein:  120-130g  Fluid:  >2L    Larkin Ina, MS, RD, LDN (she/her/hers) RD pager number and weekend/on-call pager number located in [place].

## 2021-03-20 ENCOUNTER — Ambulatory Visit
Admit: 2021-03-20 | Discharge: 2021-03-20 | Disposition: A | Discharge status: Home / Self Care | Payer: 59 | Attending: Radiation Oncology | Admitting: Radiation Oncology

## 2021-03-20 DIAGNOSIS — R042 Hemoptysis: Secondary | ICD-10-CM | POA: Diagnosis not present

## 2021-03-20 DIAGNOSIS — C3491 Malignant neoplasm of unspecified part of right bronchus or lung: Secondary | ICD-10-CM | POA: Diagnosis not present

## 2021-03-20 DIAGNOSIS — C3411 Malignant neoplasm of upper lobe, right bronchus or lung: Secondary | ICD-10-CM

## 2021-03-20 DIAGNOSIS — R52 Pain, unspecified: Secondary | ICD-10-CM | POA: Diagnosis not present

## 2021-03-20 DIAGNOSIS — J9621 Acute and chronic respiratory failure with hypoxia: Secondary | ICD-10-CM | POA: Diagnosis not present

## 2021-03-20 DIAGNOSIS — Z515 Encounter for palliative care: Secondary | ICD-10-CM

## 2021-03-20 DIAGNOSIS — R0489 Hemorrhage from other sites in respiratory passages: Secondary | ICD-10-CM | POA: Diagnosis not present

## 2021-03-20 DIAGNOSIS — D649 Anemia, unspecified: Secondary | ICD-10-CM | POA: Diagnosis not present

## 2021-03-20 LAB — BASIC METABOLIC PANEL
Anion gap: 11 (ref 5–15)
BUN: 9 mg/dL (ref 6–20)
CO2: 24 mmol/L (ref 22–32)
Calcium: 9.1 mg/dL (ref 8.9–10.3)
Chloride: 99 mmol/L (ref 98–111)
Creatinine, Ser: 0.68 mg/dL (ref 0.61–1.24)
GFR, Estimated: >60 mL/min (ref >60)
Glucose, Bld: 211 mg/dL — ABNORMAL HIGH (ref 70–99)
Potassium: 3.8 mmol/L (ref 3.5–5.1)
Sodium: 134 mmol/L — ABNORMAL LOW (ref 135–145)

## 2021-03-20 LAB — CBC WITH DIFFERENTIAL/PLATELET
Abs Immature Granulocytes: 0.2 10*3/uL — ABNORMAL HIGH (ref 0.00–0.07)
Basophils Absolute: 0 10*3/uL (ref 0.0–0.1)
Basophils Relative: 0 %
Eosinophils Absolute: 0 10*3/uL (ref 0.0–0.5)
Eosinophils Relative: 0 %
HCT: 26.8 % — ABNORMAL LOW (ref 39.0–52.0)
Hemoglobin: 8.8 g/dL — ABNORMAL LOW (ref 13.0–17.0)
Immature Granulocytes: 1 %
Lymphocytes Relative: 3 %
Lymphs Abs: 0.7 10*3/uL (ref 0.7–4.0)
MCH: 30.3 pg (ref 26.0–34.0)
MCHC: 32.8 g/dL (ref 30.0–36.0)
MCV: 92.4 fL (ref 80.0–100.0)
Monocytes Absolute: 1.2 10*3/uL — ABNORMAL HIGH (ref 0.1–1.0)
Monocytes Relative: 6 %
Neutro Abs: 18.1 10*3/uL — ABNORMAL HIGH (ref 1.7–7.7)
Neutrophils Relative %: 90 %
Platelets: 304 10*3/uL (ref 150–400)
RBC: 2.9 MIL/uL — ABNORMAL LOW (ref 4.22–5.81)
RDW: 14.5 % (ref 11.5–15.5)
WBC: 20.2 10*3/uL — ABNORMAL HIGH (ref 4.0–10.5)
nRBC: 0 % (ref 0.0–0.2)

## 2021-03-20 LAB — MRSA NEXT GEN BY PCR, NASAL: MRSA by PCR Next Gen: NOT DETECTED

## 2021-03-20 LAB — PROCALCITONIN: Procalcitonin: <0.1 ng/mL

## 2021-03-20 MED ORDER — ALBUTEROL SULFATE (2.5 MG/3ML) 0.083% IN NEBU
2.5000 mg | INHALATION_SOLUTION | Freq: Four times a day (QID) | RESPIRATORY_TRACT | Status: DC
  Administered 2021-03-20 – 2021-04-02 (×46): 2.5 mg via RESPIRATORY_TRACT
  Filled 2021-03-20 (×52): qty 3

## 2021-03-20 MED ORDER — HYDROMORPHONE HCL 1 MG/ML IJ SOLN
1.0000 mg | Freq: Once | INTRAMUSCULAR | Status: AC
  Administered 2021-03-20: 1 mg via INTRAMUSCULAR
  Filled 2021-03-20: qty 1

## 2021-03-20 MED ORDER — HYDROMORPHONE 1 MG/ML IV SOLN
INTRAVENOUS | Status: DC
  Administered 2021-03-20: 30 mg via INTRAVENOUS
  Administered 2021-03-20 – 2021-03-21 (×3): 1 mg via INTRAVENOUS
  Administered 2021-03-21: 30 mg via INTRAVENOUS
  Administered 2021-03-21: 1 mg via INTRAVENOUS
  Administered 2021-03-22: 10.19 mg via INTRAVENOUS
  Administered 2021-03-22: 8.57 mL via INTRAVENOUS
  Administered 2021-03-22: 1.5 mL via INTRAVENOUS
  Filled 2021-03-20 (×5): qty 30

## 2021-03-20 MED ORDER — ADULT MULTIVITAMIN W/MINERALS CH
1.0000 | ORAL_TABLET | Freq: Every day | ORAL | Status: DC
  Administered 2021-03-20 – 2021-04-10 (×22): 1 via ORAL
  Filled 2021-03-20 (×22): qty 1

## 2021-03-20 MED ORDER — ENSURE ENLIVE PO LIQD
237.0000 mL | Freq: Two times a day (BID) | ORAL | Status: DC
  Administered 2021-03-20 – 2021-03-26 (×7): 237 mL via ORAL
  Filled 2021-03-20 (×2): qty 237

## 2021-03-20 MED ORDER — HYDROMORPHONE HCL 2 MG PO TABS
4.0000 mg | ORAL_TABLET | ORAL | Status: DC | PRN
  Administered 2021-03-21 – 2021-03-22 (×2): 4 mg via ORAL
  Filled 2021-03-20 (×2): qty 2

## 2021-03-20 MED ORDER — GUAIFENESIN ER 600 MG PO TB12
600.0000 mg | ORAL_TABLET | Freq: Two times a day (BID) | ORAL | Status: DC
  Administered 2021-03-20 – 2021-04-10 (×43): 600 mg via ORAL
  Filled 2021-03-20 (×43): qty 1

## 2021-03-20 NOTE — Progress Notes (Signed)
Daily Progress Note   Patient Name: Christopher Carter       Date: 03/20/2021 DOB: 02-03-1961  Age: 60 y.o. MRN#: [id number] Attending Physician: Shelly Coss, MD Primary Care Physician: [name], MD Admit Date: 03/18/2021  Reason for Consultation/Follow-up: Pain control  Subjective: Medical records reviewed. Discussed with RN Christopher Carter and assessed patient at the bedside. He is sitting up in bed eating his lunch, in no acute distress. His friend Christopher Carter is at the bedside.  Christopher Carter has had a "pretty good" day and tolerated transfer to and from [place] with the PCA for pain control. He is grateful for the addition of a basal rate to his PCA, reporting a slight overall improvement and fewer exacerbations since yesterday. He notes that his 24 hour total dose may not accurately depict his needs, as his PCA was interrupted yesterday afternoon for a few hours. His IV came out and access was delayed due to multiple unsuccessful attempts. He did not receive oral medications during this time and suffered with severe pain.   Of note, the IV steroid dose was administered while he did not have the PCA and he is uncertain whether it would have been be helpful in different circumstances. He also notes a delay in receiving his nebulized albuterol last night, which is usually helpful. This has been frustrating for him, as he would like to figure out an effective pain regimen and focus more on his cancer treatments.    Christopher Carter continues to report 7/10 pain at rest and difficulty sleeping due to pain. He is able to sleep for about an hour at a time with PCA bolus doses. He denies any side effects and he is agreeable to increasing basal dose for a goal of 5/10 pain and improved sleep. He has not tried to ambulate  since admission and expresses his fear of discharging without a good plan for pain control.  Questions and concerns addressed. PMT will continue to support holistically.   Length of Stay: 1  Current Medications: Scheduled Meds:  . albuterol  2.5 mg Nebulization Q6H  . amLODipine  10 mg Oral Daily  . gabapentin  300 mg Oral BID WC  . gabapentin  600 mg Oral QHS  . guaiFENesin  600 mg Oral BID  . HYDROmorphone   Intravenous Q4H  .  irbesartan  300 mg Oral Daily  . latanoprost  1 drop Both Eyes QHS  . lidocaine  1 patch Transdermal Q24H  . pantoprazole  40 mg Oral Daily  . predniSONE  10 mg Oral Q breakfast  . QUEtiapine  100 mg Oral QHS  . sertraline  100 mg Oral Daily  . sodium chloride flush  3 mL Intravenous Q12H  . tranexamic acid  500 mg Nebulization Once    Continuous Infusions: . ceFEPime (MAXIPIME) IV 2 g (03/20/21 0536)  . tranexamic acid      PRN Meds: acetaminophen, albuterol, diphenhydrAMINE **OR** diphenhydrAMINE, naloxone **AND** sodium chloride flush, ondansetron **OR** ondansetron (ZOFRAN) IV, ondansetron (ZOFRAN) IV, polyethylene glycol  Physical Exam Vitals and nursing note reviewed.  Constitutional:      General: He is not in acute distress.    Appearance: He is ill-appearing.  Cardiovascular:     Rate and Rhythm: Normal rate.  Pulmonary:     Effort: Pulmonary effort is normal.  Neurological:     Mental Status: He is alert and oriented to person, place, and time.  Psychiatric:        Mood and Affect: Mood normal.            Vital Signs: BP (!) 167/68 (BP Location: Right Arm)   Pulse 93   Temp 98.5 F (36.9 C) (Oral)   Resp 18   Ht 5\' 10"  (1.778 m)   Wt 79.4 kg   SpO2 98%   BMI 25.11 kg/m  SpO2: SpO2: 98 % O2 Device: O2 Device: Nasal Cannula O2 Flow Rate: O2 Flow Rate (L/min): 5 L/min  Intake/output summary:  Intake/Output Summary (Last 24 hours) at 03/20/2021 1404 Last data filed at 03/20/2021 0700 Gross per 24 hour  Intake 750 ml   Output 1650 ml  Net -900 ml   LBM: Last BM Date: 03/18/21 Baseline Weight: Weight: 78.9 kg Most recent weight: Weight: 79.4 kg       Palliative Assessment/Data: 60%      Patient Active Problem List   Diagnosis Date Noted  . Palliative care by specialist   . 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 Uncontrolled pain 03/18/2021  . Leukocytosis 03/18/2021  . Normocytic anemia 03/18/2021  . Tooth abscess 03/18/2021  . Thrombocytosis 03/18/2021  . 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  . Acute on chronic respiratory failure with hypoxia ([place]) 02/26/2021  . Malignant neoplasm of right upper lobe of lung ([place]) 02/26/2021  . Pneumonia of right lung due to infectious organism 02/26/2021  . Sepsis due to pneumonia ([place]) 02/26/2021  . Major depressive disorder 02/26/2021  . S/P lobectomy of lung 01/22/2021  . Essential hypertension 01/15/2021  . Mass of upper lobe of right lung 12/22/2020  . Hemoptysis 12/22/2020    Palliative Care Assessment & Plan   Patient Profile: 60 y.o. 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 Koleen Nimrod on 5/16, hypertension, anxiety, and depression admitted on 03/18/2021 with hemoptysis, shortness of breath, and uncontrolled cancer-related pain.   Palliative medicine has been consulted to assist with pain management and goals of care conversation.    Assessment: Stage IV lung adenocarcinoma now s/p right upper lobectomy Acute on chronic hypoxic respiratory failure, improved to baseline Recurrent hemoptysis, improving Uncontrolled cancer-related pain, improving  Recommendations/Plan: Increase dilaudid PCA basal dose to 1.5mg /hr, ongoing management Dilaudid PO is available PRN if issues with IV access arise again Psychosocial and emotional support provided Ongoing  support from PMT  Goals of Care and Additional Recommendations: Limitations on Scope of Treatment:  Full Scope Treatment  Code Status: Full   Code Status Orders  (From admission, onward)           Start     Ordered   03/18/21 1527  Full code  Continuous        03/18/21 1527           Code Status History     Date Active Date Inactive Code Status Order ID Comments User Context   02/26/2021 0613 03/07/2021 1843 Full Code [id number]  Vernelle Emerald, MD ED   01/22/2021 1529 01/26/2021 1518 Full Code [id number]  Elgie Collard, PA-C Inpatient       Prognosis:  Unable to determine  Discharge Planning: Home with Palliative Services  Care plan was discussed with patient, Dr. Hilma Favors   Total time: 35 minutes Greater than 50% of this time was spent in counseling and coordinating care related to the above assessment and plan.  Dorthy Cooler, PA-C Palliative Medicine Team Team phone # [phone]  Thank you for allowing the Palliative Medicine Team to assist in the care of this patient. Please utilize secure chat with additional questions, if there is no response within 30 minutes please call the above phone number.  Palliative Medicine Team providers are available by phone from 7am to 7pm daily and can be reached through the team cell phone.  Should this patient require assistance outside of these hours, please call the patient's attending physician.

## 2021-03-20 NOTE — Progress Notes (Signed)
Patient transferred to [facility] via [place]. Report given to transport. Transferred with PCA pump. Attending saw patient before transfer. The patient is stable with no signs of distress.

## 2021-03-20 NOTE — Progress Notes (Signed)
PROGRESS NOTE    Christopher Carter  LGX:[id number] DOB: 07-22-1961 DOA: 03/18/2021 PCP: [name], MD   Chief Complain: Hemoptysis  Brief Narrative: Patient is a 60 year old male with history of adenocarcinoma of the lung status post right upper lobe lobectomy, chest wall dissection by Dr. Koleen Nimrod on 5/16, hypertension, anxiety, depression who presented with hemoptysis.  He was admitted recently from 6/19-6/29 for hemoptysis, shortness of breath.  At that time he was found to have loculated hemothorax requiring IR placement of a chest tube and empiric antibiotics with prednisone taper.  Chest tube was removed prior to discharge.  He has following with cardiothoracic surgery and radiation oncology.  Patient again developed persistent cough and coughed of blood more frequently than before.  He was also on oxygen at 4 L/min at home which was bumped to 6 L.  Patient was then brought to the emergency department.  On presentation he was hemodynamically stable.  Lab work showed leukocytosis, hemoglobin of 10.8.  CT chest showed airspace disease in the right lung representing alveolar hemorrhage with recurrence of apical fluid collection, nodular masslike lesion concerning for recurrence of malignancy, right peritracheal adenopathy.  PCCM, cardiothoracic surgery consulted.  Radiation oncology started on radiation treatment  Assessment & Plan:   Principal Problem:   Intra-alveolar hemorrhage Active Problems:   Essential hypertension   Malignant neoplasm of right upper lobe of lung (HCC)   Major depressive disorder   Uncontrolled pain   Leukocytosis   Normocytic anemia   Tooth abscess   Thrombocytosis   Intra-alveolar hemorrhage: Has history of recurrent hematemesis, recently admitted in June for the same, at that time chest tube was placed and was removed on discharge.  He was following with cardiothoracic surgery and radiation oncology. Presented with recurrent hemoptysis, increment   in the oxygen requirement.  Possibility of recurrence of malignancy/underlying infection. Continue to monitor respiratory status.  IR consulted for chest tube placement but the plan is currently on hold..  Cardiothoracic surgery, PCCM also consulted. Also underwent bronchoscopy  by PCCM without finding of any active bleeding.  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 Koleen Nimrod on 5/16 Radiation oncology starting radiation treatment  Chronic respiratory failure with hypoxia: On 4 L of oxygen per minute at home.Now at baseline again  Uncontrolled pain: Currently on Dilaudid pump.  Palliative care consulted for pain control.  Leukocytosis: Elevated WBC at 18.2 on admission.  Most likely multifactorial causes.  Continue to monitor.  Continue cefepime for now.  Tooth abscess: He was on on penicillin V that was started as an outpatient.  Plan for root canal treatment on 7/11 this needs to be postponed.  Normocytic anemia: Hemoglobin has dropped to 8.8 today.  Was 11.5 on 7/7.  Could be associated with hemoptysis.  We will continue to monitor.  Hypertension: Currently blood pressure stable.  Continue home amlodipine, irbesartan.  History of anxiety/depression: On Seroquel, Zoloft  GERD:On  Protonix    Nutrition Problem: Increased nutrient needs Etiology: cancer and cancer related treatments      DVT prophylaxis:SCD Code Status: Full Family Communication: None at bedside Status is: Inpatient  Remains inpatient appropriate because:Inpatient level of care appropriate due to severity of illness  Dispo: The patient is from: Home              Anticipated d/c is to: Home  Patient currently is not medically stable to d/c.   Difficult to place patient No     Consultants: PCCM, IR, cardiothoracic surgery  Procedures: Bronchoscopy,Plan for chest tube  placement  Antimicrobials:  Anti-infectives (From admission, onward)    Start     Dose/Rate Route Frequency Ordered Stop   03/19/21 0900  vancomycin (VANCOREADY) IVPB 1250 mg/250 mL        1,250 mg 166.7 mL/hr over 90 Minutes Intravenous Every 12 hours 03/18/21 1842     03/18/21 2200  ceFEPIme (MAXIPIME) 2 g in sodium chloride 0.9 % 100 mL IVPB        2 g 200 mL/hr over 30 Minutes Intravenous Every 8 hours 03/18/21 1836     03/18/21 1930  vancomycin (VANCOREADY) IVPB 1500 mg/300 mL        1,500 mg 150 mL/hr over 120 Minutes Intravenous  Once 03/18/21 1836 03/19/21 0734   03/18/21 1915  azithromycin (ZITHROMAX) 500 mg in sodium chloride 0.9 % 250 mL IVPB  Status:  Discontinued        500 mg 250 mL/hr over 60 Minutes Intravenous Every 24 hours 03/18/21 1822 03/19/21 0840   03/18/21 1800  penicillin v potassium (VEETID) tablet 500 mg  Status:  Discontinued       Note to Pharmacy: Start date : 03/14/21     500 mg Oral 4 times daily 03/18/21 1527 03/18/21 1842       Subjective:  Patient seen and examined the bedside this morning.  Hemodynamically stable.  He feels better.  He is on 5 L of oxygen per minute.  Has some cough.  He was being transferred to [facility] for radiation treatment  Objective: Vitals:   03/20/21 0345 03/20/21 0500 03/20/21 0738 03/20/21 0741  BP:  133/81 129/80   Pulse:  89    Resp: 19 18  14   Temp:  98.2 F (36.8 C) 98.5 F (36.9 C)   TempSrc:  Oral Oral   SpO2: 98%   98%  Weight:      Height:        Intake/Output Summary (Last 24 hours) at 03/20/2021 0835 Last data filed at 03/20/2021 0700 Gross per 24 hour  Intake 1350 ml  Output 1655 ml  Net -305 ml   Filed Weights   03/18/21 0240 03/18/21 0242  Weight: 78.9 kg 79.4 kg    Examination:  General exam: Overall comfortable, not in distress HEENT: PERRL Respiratory system: Bilateral rhonchi but no wheezing or crackles Cardiovascular system: S1 & S2 heard, RRR.  Gastrointestinal system:  Abdomen is nondistended, soft and nontender. Central nervous system: Alert and oriented Extremities: No edema, no clubbing ,no cyanosis Skin: No rashes, no ulcers,no icterus     Data Reviewed: I have personally reviewed following labs and imaging studies  CBC: Recent Labs  Lab 03/15/21 1254 03/18/21 0246 03/18/21 1551 03/18/21 2208 03/19/21 0215 03/20/21 0407  WBC 30.0* 18.1*  --   --  19.4* 20.2*  NEUTROABS 25.5* 13.2*  --   --   --  18.1*  HGB 11.5* 10.8* 9.3* 9.8* 9.4* 8.8*  HCT 35.7* 33.1* 29.0* 29.9* 28.8* 26.8*  MCV 93.9 90.9  --   --  92.3 92.4  PLT 574* 405*  --   --  352 417   Basic Metabolic Panel: Recent Labs  Lab 03/15/21 1254 03/18/21 0246 03/19/21 0215 03/20/21 0407  NA 133* 134* 130* 134*  K 4.5 4.0 4.2 3.8  CL 96* 98 95* 99  CO2 27 26 27 24   GLUCOSE 164* 172* 158* 211*  BUN 7 13 8 9   CREATININE 0.68 0.50* 0.51* 0.68  CALCIUM 9.3 9.0 8.5* 9.1   GFR: Estimated Creatinine Clearance: 103.9 mL/min (by C-G formula based on SCr of 0.68 mg/dL). Liver Function Tests: Recent Labs  Lab 03/15/21 1254  AST 12*  ALT 36  ALKPHOS 94  BILITOT 0.8  PROT 6.7  ALBUMIN 3.1*   No results for input(s): LIPASE, AMYLASE in the last 168 hours. No results for input(s): AMMONIA in the last 168 hours. Coagulation Profile: Recent Labs  Lab 03/18/21 0246 03/18/21 1609 03/19/21 0215  INR 1.0 1.0 1.1   Cardiac Enzymes: No results for input(s): CKTOTAL, CKMB, CKMBINDEX, TROPONINI in the last 168 hours. BNP (last 3 results) No results for input(s): PROBNP in the last 8760 hours. HbA1C: No results for input(s): HGBA1C in the last 72 hours. CBG: No results for input(s): GLUCAP in the last 168 hours. Lipid Profile: No results for input(s): CHOL, HDL, LDLCALC, TRIG, CHOLHDL, LDLDIRECT in the last 72 hours. Thyroid Function Tests: No results for input(s): TSH, T4TOTAL, FREET4, T3FREE, THYROIDAB in the last 72 hours. Anemia Panel: No results for input(s):  VITAMINB12, FOLATE, FERRITIN, TIBC, IRON, RETICCTPCT in the last 72 hours. Sepsis Labs: Recent Labs  Lab 03/15/21 1254 03/18/21 1551 03/19/21 0215 03/20/21 0407  PROCALCITON  --  <0.10 0.12 <0.10  LATICACIDVEN 1.7  --   --   --     Recent Results (from the past 240 hour(s))  Resp Panel by RT-PCR (Flu A&B, Covid) Nasopharyngeal Swab     Status: None   Collection Time: 03/18/21  2:57 AM   Specimen: Nasopharyngeal Swab; Nasopharyngeal(NP) swabs in vial transport medium  Result Value Ref Range Status   SARS Coronavirus 2 by RT PCR NEGATIVE NEGATIVE Final    Comment: (NOTE) SARS-CoV-2 target nucleic acids are NOT DETECTED.  The SARS-CoV-2 RNA is generally detectable in upper respiratory specimens during the acute phase of infection. The lowest concentration of SARS-CoV-2 viral copies this assay can detect is 138 copies/mL. A negative result does not preclude SARS-Cov-2 infection and should not be used as the sole basis for treatment or other patient management decisions. A negative result may occur with  improper specimen collection/handling, submission of specimen other than nasopharyngeal swab, presence of viral mutation(s) within the areas targeted by this assay, and inadequate number of viral copies(<138 copies/mL). A negative result must be combined with clinical observations, patient history, and epidemiological information. The expected result is Negative.  Fact Sheet for Patients:  EntrepreneurPulse.com.au  Fact Sheet for Healthcare Providers:  IncredibleEmployment.be  This test is no t yet approved or cleared by the [place] FDA and  has been authorized for detection and/or diagnosis of SARS-CoV-2 by FDA under an Emergency Use Authorization (EUA). This EUA will remain  in effect (meaning this test can be used) for the duration of the COVID-19 declaration under Section 564(b)(1) of the Act, 21 U.S.C.section 360bbb-3(b)(1), unless  the authorization is terminated  or revoked sooner.       Influenza A by PCR NEGATIVE NEGATIVE Final   Influenza B by PCR NEGATIVE NEGATIVE Final    Comment: (NOTE) The Xpert Xpress SARS-CoV-2/FLU/RSV plus assay is intended as an aid in the diagnosis of influenza from Nasopharyngeal swab specimens and should not be used as a sole basis for treatment. Nasal washings and aspirates are unacceptable for Xpert Xpress SARS-CoV-2/FLU/RSV testing.  Fact Sheet for Patients: EntrepreneurPulse.com.au  Fact  Sheet for Healthcare Providers: IncredibleEmployment.be  This test is not yet approved or cleared by the [place] and has been authorized for detection and/or diagnosis of SARS-CoV-2 by FDA under an Emergency Use Authorization (EUA). This EUA will remain in effect (meaning this test can be used) for the duration of the COVID-19 declaration under Section 564(b)(1) of the Act, 21 U.S.C. section 360bbb-3(b)(1), unless the authorization is terminated or revoked.  Performed at [facility], [address], [place], [place] [address]          Radiology Studies: No results found.      Scheduled Meds:  albuterol  2.5 mg Nebulization Q6H   amLODipine  10 mg Oral Daily   gabapentin  300 mg Oral BID WC   gabapentin  600 mg Oral QHS   HYDROmorphone   Intravenous Q4H   irbesartan  300 mg Oral Daily   latanoprost  1 drop Both Eyes QHS   lidocaine  1 patch Transdermal Q24H   pantoprazole  40 mg Oral Daily   predniSONE  10 mg Oral Q breakfast   QUEtiapine  100 mg Oral QHS   sertraline  100 mg Oral Daily   sodium chloride flush  3 mL Intravenous Q12H   tranexamic acid  500 mg Nebulization Once   Continuous Infusions:  ceFEPime (MAXIPIME) IV 2 g (03/20/21 0536)   tranexamic acid     vancomycin 1,250 mg (03/19/21 2154)     LOS: 1 day    Time spent:25 mins. More than 50% of that time was spent in counseling and/or  coordination of care.      Shelly Coss, MD [facility] P7/08/2021, 8:35 AM

## 2021-03-20 NOTE — Progress Notes (Signed)
NAME:  Christopher Carter, MRN:  [id number], DOB:  1961/07/30, LOS: 1 ADMISSION DATE:  03/18/2021, CONSULTATION DATE:  03/18/2021 REFERRING MD:  Dr. Tamala Julian, CHIEF COMPLAINT:  Hemoptysis    History of Present Illness:  Christopher Carter is a 60 y.o. male with a PMH significant for Stage IIIa adenocarcinoma now S/P right upper lobectomy with node dissection 01/22/2021, on 5L [place] at baseline, tobacco abuse, HTN, and depression who presented to [facility] for recurrent hemoptysis. Patient has been treated at this facility for similar presentation and underwent video bronchoscopy and IR placed chest tube for pleural effusion with Dr. Roxan Hockey. He was discharged in stable condition with plans to follow up with cardiothoracic surgery and oncology.  He presented this admission with recurrent hemoptysis that started 4hr prior to admission with associated right upper back pain that radiates to upper neck. Patient was transferred from [place] to [facility] for cardiothoracic, pulmonary, and IR consults. Vital signs stable, WBC elevated at 18.1. CT chest was obtained and consistent with alveolar hemorrhage localized to the right with right apical fluid collection.   Pertinent  Medical History  Stage IIIa adenocarcinoma now S/P right upper lobectomy with node dissection 01/22/2021, on 5L [place] at baseline, tobacco abuse, HTN, and depression   Significant Hospital Events:  7/10 admitted with recurrent hemoptysis  7/11 bronch- nonfocal source, had bloody frothy secretions in all airways, petechiae on bronchi  Interim History / Subjective:  He had his PCA disrupted for a few hours yesterday and had uncontrolled pain, but has been ok since it was reconnected. His breathing and hemoptysis are a little better this morning.  Objective   Blood pressure 129/80, pulse 89, temperature 98.5 F (36.9 C), temperature source Oral, resp. rate 14, height 5\' 10"  (1.778 m), weight 79.4 kg, SpO2 98 %.    FiO2  (%):  [40 %] 40 %   Intake/Output Summary (Last 24 hours) at 03/20/2021 0840 Last data filed at 03/20/2021 0700 Gross per 24 hour  Intake 1350 ml  Output 1655 ml  Net -305 ml    Filed Weights   03/18/21 0240 03/18/21 0242  Weight: 78.9 kg 79.4 kg    Examination: General: chronically ill appearing man lying in bed in NAD HEENT: [place]/AT, eyes anicteric Cardiac: S1S2, RRR Respiratory: less coughing, less wet-sounding. Minimal rhonchi today. Breathing comfortably on [place]. Abdomen: soft, NT, ND Extremities: +clubbing, no cyanosis or edema Skin: warm, dry, no rashes Neuro: awake and alert, moving all extremities  Resolved Hospital Problem list     Assessment & Plan:  Acute on chronic hypoxic respiratory failure  Recurrent hemoptysis - presumed to be infectious in etiology. No focal source of bleeding identified on bronchoscopy. Loculated right pleural effusion apically-- suspect this is cancer related. Stage IV lung adenocarcinoma now s/p right upper lobectomy  Intractable cancer associated pain -Possible nerve involvement as well as the bony invasion of the transverse processes of the spine seen at the time of surgery, plus pleural mets.  This is not typical post-thoracotomy pain. Acute on chronic anemia -Appreciate TCTS's management. No drain needed for effusion at this point- this may be a loculated effusion ex-vacuo from lobectomy vs effusion related to mets. - Continue TXA nebs 3 times daily- today is day #3.  - Continue breathing treatments Q6h + Q2h PRN.   - Continue supplemental oxygen as required to maintain SPO2 greater than 90%. - Agree with PCA for pain control until an appropriate oral regimen can be established. He needs  to be able to move around and have appropriate pulmonary hygiene with tolerable pain. Appreciate assistance from palliative care on pain regimen-- we have discussed his case previously. -Transfuse for hemoglobin less than 7 or hemodynamically significant  bleeding.  Type and screen previously sent. -Continue holding chemical DVT prophylaxis. Can be restarted on hemoptysis resolved as he is high risk for VTE.  Tooth abscess  -hold PCN while on cefepime  -will need root canal eventually      Patient updated at bedside. Wife not present at bedside this morning. Going to [facility] for radiation.  Best Practice   Diet/type: Regular consistency (see orders) DVT prophylaxis: SCD GI prophylaxis: N/A Lines: N/A Foley:  N/A Code Status:  full code Last date of multidisciplinary goals of care discussion: Per primary   Labs   CBC: Recent Labs  Lab 03/15/21 1254 03/18/21 0246 03/18/21 1551 03/18/21 2208 03/19/21 0215 03/20/21 0407  WBC 30.0* 18.1*  --   --  19.4* 20.2*  NEUTROABS 25.5* 13.2*  --   --   --  18.1*  HGB 11.5* 10.8* 9.3* 9.8* 9.4* 8.8*  HCT 35.7* 33.1* 29.0* 29.9* 28.8* 26.8*  MCV 93.9 90.9  --   --  92.3 92.4  PLT 574* 405*  --   --  352 304     Basic Metabolic Panel: Recent Labs  Lab 03/15/21 1254 03/18/21 0246 03/19/21 0215 03/20/21 0407  NA 133* 134* 130* 134*  K 4.5 4.0 4.2 3.8  CL 96* 98 95* 99  CO2 27 26 27 24   GLUCOSE 164* 172* 158* 211*  BUN 7 13 8 9   CREATININE 0.68 0.50* 0.51* 0.68  CALCIUM 9.3 9.0 8.5* 9.1    GFR: Estimated Creatinine Clearance: 103.9 mL/min (by C-G formula based on SCr of 0.68 mg/dL). Recent Labs  Lab 03/15/21 1254 03/18/21 0246 03/18/21 1551 03/19/21 0215 03/20/21 0407  PROCALCITON  --   --  <0.10 0.12 <0.10  WBC 30.0* 18.1*  --  19.4* 20.2*  LATICACIDVEN 1.7  --   --   --   --       [name], DO 03/20/21 8:48 AM [facility]

## 2021-03-21 ENCOUNTER — Ambulatory Visit
Admit: 2021-03-21 | Discharge: 2021-03-21 | Disposition: A | Discharge status: Home / Self Care | Payer: 59 | Attending: Radiation Oncology | Admitting: Radiation Oncology

## 2021-03-21 ENCOUNTER — Inpatient Hospital Stay: Payer: 59 | Admitting: Internal Medicine

## 2021-03-21 ENCOUNTER — Encounter: Payer: 59 | Admitting: Thoracic Surgery (Cardiothoracic Vascular Surgery)

## 2021-03-21 DIAGNOSIS — R0489 Hemorrhage from other sites in respiratory passages: Secondary | ICD-10-CM | POA: Diagnosis not present

## 2021-03-21 DIAGNOSIS — R042 Hemoptysis: Secondary | ICD-10-CM | POA: Diagnosis not present

## 2021-03-21 DIAGNOSIS — C3411 Malignant neoplasm of upper lobe, right bronchus or lung: Secondary | ICD-10-CM | POA: Diagnosis not present

## 2021-03-21 DIAGNOSIS — J9621 Acute and chronic respiratory failure with hypoxia: Secondary | ICD-10-CM | POA: Diagnosis not present

## 2021-03-21 DIAGNOSIS — D649 Anemia, unspecified: Secondary | ICD-10-CM

## 2021-03-21 LAB — FERRITIN: Ferritin: 1108 ng/mL — ABNORMAL HIGH (ref 24–336)

## 2021-03-21 LAB — CBC WITH DIFFERENTIAL/PLATELET
Abs Immature Granulocytes: 0.14 10*3/uL — ABNORMAL HIGH (ref 0.00–0.07)
Basophils Absolute: 0 10*3/uL (ref 0.0–0.1)
Basophils Relative: 0 %
Eosinophils Absolute: 1.1 10*3/uL — ABNORMAL HIGH (ref 0.0–0.5)
Eosinophils Relative: 6 %
HCT: 26.9 % — ABNORMAL LOW (ref 39.0–52.0)
Hemoglobin: 8.5 g/dL — ABNORMAL LOW (ref 13.0–17.0)
Immature Granulocytes: 1 %
Lymphocytes Relative: 5 %
Lymphs Abs: 1 10*3/uL (ref 0.7–4.0)
MCH: 29.7 pg (ref 26.0–34.0)
MCHC: 31.6 g/dL (ref 30.0–36.0)
MCV: 94.1 fL (ref 80.0–100.0)
Monocytes Absolute: 1.6 10*3/uL — ABNORMAL HIGH (ref 0.1–1.0)
Monocytes Relative: 9 %
Neutro Abs: 15.1 10*3/uL — ABNORMAL HIGH (ref 1.7–7.7)
Neutrophils Relative %: 79 %
Platelets: 372 10*3/uL (ref 150–400)
RBC: 2.86 MIL/uL — ABNORMAL LOW (ref 4.22–5.81)
RDW: 14.9 % (ref 11.5–15.5)
WBC: 18.9 10*3/uL — ABNORMAL HIGH (ref 4.0–10.5)
nRBC: 0 % (ref 0.0–0.2)

## 2021-03-21 LAB — EXPECTORATED SPUTUM ASSESSMENT W GRAM STAIN, RFLX TO RESP C

## 2021-03-21 LAB — IRON AND TIBC
Iron: 15 ug/dL — ABNORMAL LOW (ref 45–182)
Saturation Ratios: 6 % — ABNORMAL LOW (ref 17.9–39.5)
TIBC: 249 ug/dL — ABNORMAL LOW (ref 250–450)
UIBC: 234 ug/dL

## 2021-03-21 MED ORDER — TRANEXAMIC ACID FOR INHALATION
500.0000 mg | Freq: Three times a day (TID) | RESPIRATORY_TRACT | Status: AC
  Administered 2021-03-21 – 2021-03-24 (×9): 500 mg via RESPIRATORY_TRACT
  Filled 2021-03-21 (×9): qty 10

## 2021-03-21 MED ORDER — SODIUM CHLORIDE 0.9 % IV SOLN
510.0000 mg | Freq: Once | INTRAVENOUS | Status: AC
  Administered 2021-03-21: 510 mg via INTRAVENOUS
  Filled 2021-03-21 (×2): qty 17

## 2021-03-21 MED ORDER — HYDROCOD POLST-CPM POLST ER 10-8 MG/5ML PO SUER
5.0000 mL | Freq: Two times a day (BID) | ORAL | Status: DC
  Administered 2021-03-21 – 2021-03-26 (×11): 5 mL via ORAL
  Filled 2021-03-21 (×11): qty 5

## 2021-03-21 MED ORDER — FERROUS SULFATE 325 (65 FE) MG PO TABS
325.0000 mg | ORAL_TABLET | Freq: Every day | ORAL | Status: DC
  Administered 2021-03-22 – 2021-04-10 (×20): 325 mg via ORAL
  Filled 2021-03-21 (×23): qty 1

## 2021-03-21 NOTE — Progress Notes (Signed)
Pharmacy Antibiotic Note  Christopher Carter is a 60 y.o. male admitted on 03/18/2021 with HCAP in the setting of abscess left lower tooth.  Pharmacy has been consulted for cefepime and vancomycin dosing.  Patient with worsening cough, hemoptysis and chest/back pain. CT chest with R side airspace disease likely reflecting alveolar hemorrhage, recurrent R apical fluid collection, small right pleural effusion, and right para tracheal adenopathy. IR consulted for chest tube placement but the plan is currently on hold.    . WBC 18, Tmax 100.8 on 7/7. MRSA swab negative 6/22 but will continue vancomycin for now and follow up cultures given recently post-op.    Day # 4 Cefepime  Afebrile,  WBC 19.4>20.2>18.9k Vancomycin discontinued 7/12, MD noting that leukocytosis most likely multifactorial causes including malignancy/ has h/o chronic leukocytosis.  Continues cefepime for now.  Renal function stable  Plan: Continue Cefepime 2g q8 hr Monitor cultures, clinical status, renal fx. Narrow abx as able and f/u duration    Height: 5\' 10"  (177.8 cm) Weight: 79.4 kg (175 lb) IBW/kg (Calculated) : 73  Temp (24hrs), Avg:98.2 F (36.8 C), Min:97.8 F (36.6 C), Max:98.7 F (37.1 C)  Recent Labs  Lab 03/15/21 1254 03/18/21 0246 03/19/21 0215 03/20/21 0407 03/21/21 0057  WBC 30.0* 18.1* 19.4* 20.2* 18.9*  CREATININE 0.68 0.50* 0.51* 0.68  --   LATICACIDVEN 1.7  --   --   --   --      Estimated Creatinine Clearance: 103.9 mL/min (by C-G formula based on SCr of 0.68 mg/dL).    Allergies  Allergen Reactions   Oxycodone Hcl Other (See Comments)    Pt reported "seeing things" and " feeling unusual."   Bupropion Nausea And Vomiting    Antimicrobials this admission: cefepime 7/10 >>  vanc 7/10 >> 7/12 Azithro 7/10>> 7/11 PCN 7/6>>7/10    Dose adjustments this admission: n/a   Microbiology results: 7/10 Sputum: pending  7/11 MRSA PCR: negative   Thank you for allowing pharmacy to  be a part of this patient's care. Nicole Cella, RPh Clinical Pharmacist [phone]  Please check AMION for all [facility] Pharm[facility] phone numbers After 10:00 PM, call [place] [phone]

## 2021-03-21 NOTE — Progress Notes (Signed)
PROGRESS NOTE    Christopher Carter  PPI:[id number] DOB: 1961-03-09 DOA: 03/18/2021 PCP: [name], MD   Chief Complain: Hemoptysis  Brief Narrative: Patient is a 60 year old male with history of adenocarcinoma of the lung status post right upper lobe lobectomy, chest wall dissection by Dr. Koleen Nimrod on 5/16, hypertension, anxiety, depression who presented with hemoptysis.  He was admitted recently from 6/19-6/29 for hemoptysis, shortness of breath.  At that time he was found to have loculated hemothorax requiring IR placement of a chest tube and empiric antibiotics with prednisone taper.  Chest tube was removed prior to discharge.  He has following with cardiothoracic surgery and radiation oncology.  Patient again developed persistent cough and coughed of blood more frequently than before.  He was also on oxygen at 4 L/min at home which was bumped to 6 L.  Patient was then brought to the emergency department.  On presentation he was hemodynamically stable.  Lab work showed leukocytosis, hemoglobin of 10.8.  CT chest showed airspace disease in the right lung representing alveolar hemorrhage with recurrence of apical fluid collection, nodular masslike lesion concerning for recurrence of malignancy, right peritracheal adenopathy.  PCCM, cardiothoracic surgery consulted.  Radiation oncology started on radiation treatment  Assessment & Plan:   Principal Problem:   Intra-alveolar hemorrhage Active Problems:   Essential hypertension   Acute on chronic respiratory failure with hypoxia (HCC)   Malignant neoplasm of right upper lobe of lung (HCC)   Major depressive disorder   Uncontrolled pain   Leukocytosis   Normocytic anemia   Tooth abscess   Thrombocytosis   Palliative care by specialist   Intra-alveolar hemorrhage: Has history of recurrent hematemesis, recently admitted in June for the same, at that time chest tube was placed and was removed on discharge.  He was following with  cardiothoracic surgery and radiation oncology. Presented with recurrent hemoptysis, increment  in the oxygen requirement.  Possibility of recurrence of malignancy/underlying infection. Continue to monitor respiratory status.  IR consulted for chest tube placement but the plan is currently on hold..  Cardiothoracic surgery, PCCM also consulted. Also underwent bronchoscopy  by PCCM without finding of any active bleeding. 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 Koleen Nimrod on 5/16 Radiation oncology started radiation treatment  Chronic respiratory failure with hypoxia: On 4 L of oxygen per minute at home.Now at baseline again  Uncontrolled pain: Currently on Dilaudid pump.  Palliative care consulted for pain control.  Leukocytosis: Elevated WBC at 18.2 on admission.  Most likely multifactorial causes including malignancy.  Continue to monitor.  Continue cefepime for now.  On reviewing his lab work, he has chronic leukocytosis.  Tooth abscess: He was on on penicillin V that was started as an outpatient.  Plan for root canal treatment on 7/11 this needs to be postponed.  Normocytic anemia: Hemoglobin has dropped to 8.8 today.  Was 11.5 on 7/7.  Could be associated with hemoptysis/alveolar hge.  We will continue to monitor.  Iron studies showed severe iron deficiency.  Given 1 dose of IV iron, started on oral supplementation.  Hypertension: Currently blood pressure stable.  Continue home amlodipine, irbesartan.  History of anxiety/depression: On Seroquel, Zoloft  GERD:On  Protonix    Nutrition Problem: Increased nutrient needs Etiology: cancer and cancer related treatments      DVT prophylaxis:SCD Code Status: Full Family Communication: None at bedside Status is:  Inpatient  Remains inpatient appropriate because:Inpatient level of care  appropriate due to severity of illness  Dispo: The patient is from: Home              Anticipated d/c is to: Home              Patient currently is not medically stable to d/c.   Difficult to place patient No     Consultants: PCCM, IR, cardiothoracic surgery  Procedures: Bronchoscopy,Plan for chest tube placement  Antimicrobials:  Anti-infectives (From admission, onward)    Start     Dose/Rate Route Frequency Ordered Stop   03/19/21 0900  vancomycin (VANCOREADY) IVPB 1250 mg/250 mL  Status:  Discontinued        1,250 mg 166.7 mL/hr over 90 Minutes Intravenous Every 12 hours 03/18/21 1842 03/20/21 1059   03/18/21 2200  ceFEPIme (MAXIPIME) 2 g in sodium chloride 0.9 % 100 mL IVPB        2 g 200 mL/hr over 30 Minutes Intravenous Every 8 hours 03/18/21 1836     03/18/21 1930  vancomycin (VANCOREADY) IVPB 1500 mg/300 mL        1,500 mg 150 mL/hr over 120 Minutes Intravenous  Once 03/18/21 1836 03/19/21 0734   03/18/21 1915  azithromycin (ZITHROMAX) 500 mg in sodium chloride 0.9 % 250 mL IVPB  Status:  Discontinued        500 mg 250 mL/hr over 60 Minutes Intravenous Every 24 hours 03/18/21 1822 03/19/21 0840   03/18/21 1800  penicillin v potassium (VEETID) tablet 500 mg  Status:  Discontinued       Note to Pharmacy: Start date : 03/14/21     500 mg Oral 4 times daily 03/18/21 1527 03/18/21 1842       Subjective: Patient seen and examined the bedside this morning.  Pain well controlled.  On 5 L of oxygen per minute.  Continues to cough.  No new complaints today.  Still seeing some streaks of blood when he coughs   Objective: Vitals:   03/21/21 0319 03/21/21 0400 03/21/21 0722 03/21/21 0747  BP:  132/65  119/64  Pulse:  94  93  Resp: 14 20 19 14   Temp:    98.4 F (36.9 C)  TempSrc:    Oral  SpO2: 92% 96% 92% 96%  Weight:      Height:        Intake/Output Summary (Last 24 hours) at 03/21/2021 0813 Last data filed at 03/21/2021 0304 Gross per 24 hour  Intake 2020 ml   Output 350 ml  Net 1670 ml   Filed Weights   03/18/21 0240 03/18/21 0242  Weight: 78.9 kg 79.4 kg    Examination:  General exam: Overall comfortable, not in distress HEENT: PERRL Respiratory system: Coarse breathing sound bilaterally Cardiovascular system: S1 & S2 heard, RRR.  Gastrointestinal system: Abdomen is nondistended, soft and nontender. Central nervous system: Alert and oriented Extremities: No edema, no clubbing ,no cyanosis Skin: No rashes, no ulcers,no icterus     Data Reviewed: I have personally reviewed following labs and imaging studies  CBC: Recent Labs  Lab 03/15/21 1254 03/18/21 0246 03/18/21 1551 03/18/21 2208 03/19/21 0215 03/20/21 0407 03/21/21 0057  WBC 30.0* 18.1*  --   --  19.4* 20.2* 18.9*  NEUTROABS 25.5* 13.2*  --   --   --  18.1* 15.1*  HGB 11.5* 10.8* 9.3* 9.8* 9.4* 8.8* 8.5*  HCT 35.7* 33.1* 29.0* 29.9* 28.8* 26.8* 26.9*  MCV 93.9 90.9  --   --  92.3 92.4 94.1  PLT 574* 405*  --   --  352 304 361   Basic Metabolic Panel: Recent Labs  Lab 03/15/21 1254 03/18/21 0246 03/19/21 0215 03/20/21 0407  NA 133* 134* 130* 134*  K 4.5 4.0 4.2 3.8  CL 96* 98 95* 99  CO2 27 26 27 24   GLUCOSE 164* 172* 158* 211*  BUN 7 13 8 9   CREATININE 0.68 0.50* 0.51* 0.68  CALCIUM 9.3 9.0 8.5* 9.1   GFR: Estimated Creatinine Clearance: 103.9 mL/min (by C-G formula based on SCr of 0.68 mg/dL). Liver Function Tests: Recent Labs  Lab 03/15/21 1254  AST 12*  ALT 36  ALKPHOS 94  BILITOT 0.8  PROT 6.7  ALBUMIN 3.1*   No results for input(s): LIPASE, AMYLASE in the last 168 hours. No results for input(s): AMMONIA in the last 168 hours. Coagulation Profile: Recent Labs  Lab 03/18/21 0246 03/18/21 1609 03/19/21 0215  INR 1.0 1.0 1.1   Cardiac Enzymes: No results for input(s): CKTOTAL, CKMB, CKMBINDEX, TROPONINI in the last 168 hours. BNP (last 3 results) No results for input(s): PROBNP in the last 8760 hours. HbA1C: No results for  input(s): HGBA1C in the last 72 hours. CBG: No results for input(s): GLUCAP in the last 168 hours. Lipid Profile: No results for input(s): CHOL, HDL, LDLCALC, TRIG, CHOLHDL, LDLDIRECT in the last 72 hours. Thyroid Function Tests: No results for input(s): TSH, T4TOTAL, FREET4, T3FREE, THYROIDAB in the last 72 hours. Anemia Panel: No results for input(s): VITAMINB12, FOLATE, FERRITIN, TIBC, IRON, RETICCTPCT in the last 72 hours. Sepsis Labs: Recent Labs  Lab 03/15/21 1254 03/18/21 1551 03/19/21 0215 03/20/21 0407  PROCALCITON  --  <0.10 0.12 <0.10  LATICACIDVEN 1.7  --   --   --     Recent Results (from the past 240 hour(s))  Resp Panel by RT-PCR (Flu A&B, Covid) Nasopharyngeal Swab     Status: None   Collection Time: 03/18/21  2:57 AM   Specimen: Nasopharyngeal Swab; Nasopharyngeal(NP) swabs in vial transport medium  Result Value Ref Range Status   SARS Coronavirus 2 by RT PCR NEGATIVE NEGATIVE Final    Comment: (NOTE) SARS-CoV-2 target nucleic acids are NOT DETECTED.  The SARS-CoV-2 RNA is generally detectable in upper respiratory specimens during the acute phase of infection. The lowest concentration of SARS-CoV-2 viral copies this assay can detect is 138 copies/mL. A negative result does not preclude SARS-Cov-2 infection and should not be used as the sole basis for treatment or other patient management decisions. A negative result may occur with  improper specimen collection/handling, submission of specimen other than nasopharyngeal swab, presence of viral mutation(s) within the areas targeted by this assay, and inadequate number of viral copies(<138 copies/mL). A negative result must be combined with clinical observations, patient history, and epidemiological information. The expected result is Negative.  Fact Sheet for Patients:  EntrepreneurPulse.com.au  Fact Sheet for Healthcare Providers:  IncredibleEmployment.be  This test is  no t yet approved or cleared by the [place] FDA and  has been authorized for detection and/or diagnosis of SARS-CoV-2 by FDA under an Emergency Use Authorization (EUA). This EUA will remain  in effect (meaning this test can be used) for the duration of the COVID-19 declaration under Section 564(b)(1) of the Act, 21 U.S.C.section 360bbb-3(b)(1), unless the authorization is terminated  or revoked sooner.       Influenza A by PCR NEGATIVE NEGATIVE Final   Influenza B by PCR NEGATIVE NEGATIVE Final  Comment: (NOTE) The Xpert Xpress SARS-CoV-2/FLU/RSV plus assay is intended as an aid in the diagnosis of influenza from Nasopharyngeal swab specimens and should not be used as a sole basis for treatment. Nasal washings and aspirates are unacceptable for Xpert Xpress SARS-CoV-2/FLU/RSV testing.  Fact Sheet for Patients: EntrepreneurPulse.com.au  Fact Sheet for Healthcare Providers: IncredibleEmployment.be  This test is not yet approved or cleared by the [place] FDA and has been authorized for detection and/or diagnosis of SARS-CoV-2 by FDA under an Emergency Use Authorization (EUA). This EUA will remain in effect (meaning this test can be used) for the duration of the COVID-19 declaration under Section 564(b)(1) of the Act, 21 U.S.C. section 360bbb-3(b)(1), unless the authorization is terminated or revoked.  Performed at [facility], [address] [place], [place] [address]   MRSA Next Gen by PCR, Nasal     Status: None   Collection Time: 03/19/21  8:41 AM   Specimen: Nasal Mucosa; Nasal Swab  Result Value Ref Range Status   MRSA by PCR Next Gen NOT DETECTED NOT DETECTED Final    Comment: (NOTE) The GeneXpert MRSA Assay (FDA approved for NASAL specimens only), is one component of a comprehensive MRSA colonization surveillance program. It is not intended to diagnose MRSA infection nor to guide or monitor  treatment for MRSA infections. Test performance is not FDA approved in patients less than 71 years old. Performed at [facility], [place] [address], [place], [place] [address]          Radiology Studies: No results found.      Scheduled Meds:  albuterol  2.5 mg Nebulization Q6H   amLODipine  10 mg Oral Daily   feeding supplement  237 mL Oral BID BM   gabapentin  300 mg Oral BID WC   gabapentin  600 mg Oral QHS   guaiFENesin  600 mg Oral BID   HYDROmorphone   Intravenous Q4H   irbesartan  300 mg Oral Daily   latanoprost  1 drop Both Eyes QHS   lidocaine  1 patch Transdermal Q24H   multivitamin with minerals  1 tablet Oral Daily   pantoprazole  40 mg Oral Daily   predniSONE  10 mg Oral Q breakfast   QUEtiapine  100 mg Oral QHS   sertraline  100 mg Oral Daily   sodium chloride flush  3 mL Intravenous Q12H   tranexamic acid  500 mg Nebulization Once   Continuous Infusions:  ceFEPime (MAXIPIME) IV 2 g (03/21/21 0556)   tranexamic acid       LOS: 2 days    Time spent:25 mins. More than 50% of that time was spent in counseling and/or coordination of care.      Shelly Coss, MD [facility] P7/13/2022, 8:13 AM

## 2021-03-21 NOTE — Progress Notes (Addendum)
      [address]       [place] [address]             [phone]      Attempted to see Mr. Reinig earlier today but he is away at [facility] for radiation treatment.  Chart reviewed. Nothing to add to current mgt from thoracic surgery standpoint.  We will continue to follow peripherally and remain available to assist as needed.   Macarthur Critchley, PA-C [phone]   Agree with above. Please call with questions.  Shaundra Fullam Bary Leriche

## 2021-03-21 NOTE — Anesthesia Postprocedure Evaluation (Signed)
Anesthesia Post Note  Patient: Christopher Carter  Procedure(s) Performed: VIDEO BRONCHOSCOPY WITHOUT FLUORO     Patient location during evaluation: Endoscopy Anesthesia Type: General Level of consciousness: patient cooperative Pain management: pain level controlled Vital Signs Assessment: post-procedure vital signs reviewed and stable Respiratory status: spontaneous breathing, nonlabored ventilation, respiratory function stable and patient connected to face mask oxygen Cardiovascular status: blood pressure returned to baseline and stable Postop Assessment: no apparent nausea or vomiting Anesthetic complications: no   No notable events documented.  Last Vitals:  Vitals:   03/21/21 1510 03/21/21 1600  BP:    Pulse:    Resp:  16  Temp:    SpO2: 92% 94%    Last Pain:  Vitals:   03/21/21 1640  TempSrc:   PainSc: Asleep                 Felipe Cabell

## 2021-03-21 NOTE — Progress Notes (Signed)
NAME:  Christopher Carter, MRN:  [id number], DOB:  01-02-1961, LOS: 2 ADMISSION DATE:  03/18/2021, CONSULTATION DATE:  03/18/2021 REFERRING MD:  Dr. Tamala Julian, CHIEF COMPLAINT:  Hemoptysis    History of Present Illness:  Christopher Carter is a 60 y.o. male with a PMH significant for Stage IV adenocarcinoma now S/P right upper lobectomy with node dissection 01/22/2021, on 5L [place] at baseline, tobacco abuse, HTN, and depression who presented to [facility] for recurrent hemoptysis. Patient has been treated at this facility for similar presentation and underwent video bronchoscopy and IR placed chest tube for pleural effusion with Dr. Roxan Hockey. He was discharged in stable condition with plans to follow up with cardiothoracic surgery and oncology.  He presented this admission with recurrent hemoptysis that started 4hr prior to admission with associated right upper back pain that radiates to upper neck. Patient was transferred from [place] to [facility] for cardiothoracic, pulmonary, and IR consults. Vital signs stable, WBC elevated at 18.1. CT chest was obtained and consistent with alveolar hemorrhage localized to the right with right apical fluid collection.   Pertinent  Medical History  Stage IIIa adenocarcinoma now S/P right upper lobectomy with node dissection 01/22/2021, on 5L [place] at baseline, tobacco abuse, HTN, and depression   Significant Hospital Events:  7/10 admitted with recurrent hemoptysis  7/11 bronch- nonfocal source, had bloody frothy secretions in all airways, petechiae on bronchi  Interim History / Subjective:  Palliative care adjusted PCA dilaudid dosing-- he hasn't needed boluses but has had issues with the pump turning off. RN not sure why as his respiratory rate has been adequate the times she has checked it. Feels like he is in a fog a little more today. Still some hemoptysis, better than admission, but persistent.   Objective   Blood pressure 132/65, pulse 94,  temperature 97.8 F (36.6 C), temperature source Oral, resp. rate 19, height 5\' 10"  (1.778 m), weight 79.4 kg, SpO2 92 %.    FiO2 (%):  [40 %] 40 %   Intake/Output Summary (Last 24 hours) at 03/21/2021 0739 Last data filed at 03/21/2021 0304 Gross per 24 hour  Intake 2020 ml  Output 350 ml  Net 1670 ml    Filed Weights   03/18/21 0240 03/18/21 0242  Weight: 78.9 kg 79.4 kg    Examination: General: chronically ill appearing man lying in bed in NAD HEENT: [place]/AT, eyes anicteric, oral mucosa moist Cardiac: S1-S2, regular rate and rhythm Respiratory: Rhonchi bilaterally, still some coughing no accessory muscle use.  Respiratory rate 23 but PCA currently turned off. Abdomen: Soft, nondistended Extremities: No peripheral edema or cyanosis, clubbing present Skin: No rashes, skin warm and dry Neuro: Awake and alert, moving extremities  H/H 8.5/ 26.9 Trach aspirate > pending  Resolved Hospital Problem list     Assessment & Plan:  Acute on chronic hypoxic respiratory failure  Recurrent hemoptysis - presumed to be infectious in etiology. No focal source of bleeding identified on bronchoscopy. Loculated right pleural effusion apically-- suspect this is cancer related. Stage IV lung adenocarcinoma now s/p right upper lobectomy  Intractable cancer associated pain -Possible nerve involvement as well as the bony invasion of the transverse processes of the spine seen at the time of surgery, plus pleural mets.  This is not typical post-thoracotomy pain. Acute on chronic anemia -Appreciate TCTS's management. No drain needed for effusion at this point- this may be a loculated effusion ex-vacuo from lobectomy vs effusion related to mets. - TXA nebs -  Continue breathing treatments Q6h + Q2h PRN.   - Continue supplemental oxygen as required to maintain SPO2 greater than 90%. - Agree with PCA for pain control until an appropriate oral regimen can be established. Appreciate Palliative Care team's  management. -Transfuse for hemoglobin less than 7 or hemodynamically significant bleeding.   -Continue holding chemical DVT prophylaxis. Can be restarted on hemoptysis resolved as he is high risk for VTE. -radiation starting today  Tooth abscess  -hold PCN while on cefepime  -will need root canal eventually       Best Practice   Diet/type: Regular consistency (see orders) DVT prophylaxis: SCD GI prophylaxis: N/A Lines: N/A Foley:  N/A Code Status:  full code Last date of multidisciplinary goals of care discussion: Per primary   Labs   CBC: Recent Labs  Lab 03/15/21 1254 03/18/21 0246 03/18/21 1551 03/18/21 2208 03/19/21 0215 03/20/21 0407 03/21/21 0057  WBC 30.0* 18.1*  --   --  19.4* 20.2* 18.9*  NEUTROABS 25.5* 13.2*  --   --   --  18.1* 15.1*  HGB 11.5* 10.8* 9.3* 9.8* 9.4* 8.8* 8.5*  HCT 35.7* 33.1* 29.0* 29.9* 28.8* 26.8* 26.9*  MCV 93.9 90.9  --   --  92.3 92.4 94.1  PLT 574* 405*  --   --  352 304 372     Basic Metabolic Panel: Recent Labs  Lab 03/15/21 1254 03/18/21 0246 03/19/21 0215 03/20/21 0407  NA 133* 134* 130* 134*  K 4.5 4.0 4.2 3.8  CL 96* 98 95* 99  CO2 27 26 27 24   GLUCOSE 164* 172* 158* 211*  BUN 7 13 8 9   CREATININE 0.68 0.50* 0.51* 0.68  CALCIUM 9.3 9.0 8.5* 9.1    GFR: Estimated Creatinine Clearance: 103.9 mL/min (by C-G formula based on SCr of 0.68 mg/dL). Recent Labs  Lab 03/15/21 1254 03/18/21 0246 03/18/21 1551 03/19/21 0215 03/20/21 0407 03/21/21 0057  PROCALCITON  --   --  <0.10 0.12 <0.10  --   WBC 30.0* 18.1*  --  19.4* 20.2* 18.9*  LATICACIDVEN 1.7  --   --   --   --   --       [name], DO 03/21/21 8:43 AM [facility]

## 2021-03-22 ENCOUNTER — Ambulatory Visit
Admit: 2021-03-22 | Discharge: 2021-03-22 | Disposition: A | Discharge status: Home / Self Care | Payer: 59 | Attending: Radiation Oncology | Admitting: Radiation Oncology

## 2021-03-22 DIAGNOSIS — D649 Anemia, unspecified: Secondary | ICD-10-CM | POA: Diagnosis not present

## 2021-03-22 DIAGNOSIS — J9621 Acute and chronic respiratory failure with hypoxia: Secondary | ICD-10-CM | POA: Diagnosis not present

## 2021-03-22 DIAGNOSIS — R0489 Hemorrhage from other sites in respiratory passages: Secondary | ICD-10-CM | POA: Diagnosis not present

## 2021-03-22 DIAGNOSIS — Z515 Encounter for palliative care: Secondary | ICD-10-CM | POA: Diagnosis not present

## 2021-03-22 DIAGNOSIS — R042 Hemoptysis: Secondary | ICD-10-CM | POA: Diagnosis not present

## 2021-03-22 DIAGNOSIS — R52 Pain, unspecified: Secondary | ICD-10-CM | POA: Diagnosis not present

## 2021-03-22 DIAGNOSIS — C3411 Malignant neoplasm of upper lobe, right bronchus or lung: Secondary | ICD-10-CM | POA: Diagnosis not present

## 2021-03-22 LAB — CBC WITH DIFFERENTIAL/PLATELET
Abs Immature Granulocytes: 0.14 10*3/uL — ABNORMAL HIGH (ref 0.00–0.07)
Basophils Absolute: 0 10*3/uL (ref 0.0–0.1)
Basophils Relative: 0 %
Eosinophils Absolute: 1.3 10*3/uL — ABNORMAL HIGH (ref 0.0–0.5)
Eosinophils Relative: 8 %
HCT: 25.9 % — ABNORMAL LOW (ref 39.0–52.0)
Hemoglobin: 8.5 g/dL — ABNORMAL LOW (ref 13.0–17.0)
Immature Granulocytes: 1 %
Lymphocytes Relative: 7 %
Lymphs Abs: 1.2 10*3/uL (ref 0.7–4.0)
MCH: 30.6 pg (ref 26.0–34.0)
MCHC: 32.8 g/dL (ref 30.0–36.0)
MCV: 93.2 fL (ref 80.0–100.0)
Monocytes Absolute: 1.5 10*3/uL — ABNORMAL HIGH (ref 0.1–1.0)
Monocytes Relative: 9 %
Neutro Abs: 13 10*3/uL — ABNORMAL HIGH (ref 1.7–7.7)
Neutrophils Relative %: 75 %
Platelets: 361 10*3/uL (ref 150–400)
RBC: 2.78 MIL/uL — ABNORMAL LOW (ref 4.22–5.81)
RDW: 14.7 % (ref 11.5–15.5)
WBC: 17.2 10*3/uL — ABNORMAL HIGH (ref 4.0–10.5)
nRBC: 0 % (ref 0.0–0.2)

## 2021-03-22 LAB — GLUCOSE, CAPILLARY
Glucose-Capillary: 133 mg/dL — ABNORMAL HIGH (ref 70–99)
Glucose-Capillary: 171 mg/dL — ABNORMAL HIGH (ref 70–99)
Glucose-Capillary: 225 mg/dL — ABNORMAL HIGH (ref 70–99)
Glucose-Capillary: 209 mg/dL — ABNORMAL HIGH (ref 70–99)
Glucose-Capillary: 201 mg/dL — ABNORMAL HIGH (ref 70–99)

## 2021-03-22 MED ORDER — FENTANYL CITRATE (PF) 100 MCG/2ML IJ SOLN
50.0000 ug | Freq: Every day | INTRAMUSCULAR | Status: DC | PRN
Start: 2021-03-22 — End: 2021-04-10
  Administered 2021-03-22 – 2021-04-09 (×8): 50 ug via INTRAVENOUS
  Filled 2021-03-22 (×7): qty 2

## 2021-03-22 MED ORDER — METHYLPREDNISOLONE SODIUM SUCC 40 MG IJ SOLR
40.0000 mg | INTRAMUSCULAR | Status: DC
  Administered 2021-03-22 – 2021-03-26 (×5): 40 mg via INTRAVENOUS
  Filled 2021-03-22 (×5): qty 1

## 2021-03-22 MED ORDER — DIAZEPAM 5 MG PO TABS
10.0000 mg | ORAL_TABLET | Freq: Every day | ORAL | Status: DC | PRN
  Administered 2021-03-22 – 2021-04-06 (×9): 10 mg via ORAL
  Filled 2021-03-22 (×9): qty 2

## 2021-03-22 NOTE — Plan of Care (Signed)

## 2021-03-22 NOTE — Progress Notes (Signed)
Daily Progress Note   Patient Name: Christopher Carter       Date: 03/22/2021 DOB: [birth year]  Age: 60 y.o. MRN#: [id number] Attending Physician: Shelly Coss, MD Primary Care Physician: [name], MD Admit Date: 03/18/2021  Reason for Consultation/Follow-up: Pain control  Subjective: Medical records reviewed. Patient assessed at the bedside. He is resting comfortably and reports much better pain control before radiation today.  He continues to feel a little "buzzed" although this has already had some improvement since yesterday.  No nausea or vomiting, no itching.   Discussed side effects of opioids and likely improvement of cognitive side effects within the next day or 2.  Discussed intermittent pauses in PCA, most notably when patient falls asleep and slows his breathing. Discussed possibility of continuing radiation treatments as an outpatient if pain continues to be controlled.  Christopher Carter is not in a rush to discharge, but rather appreciates having an idea of what to expect next.  Questions and concerns addressed.  PMT will continue to support holistically.  Length of Stay: 3  Current Medications: Scheduled Meds:  . albuterol  2.5 mg Nebulization Q6H  . amLODipine  10 mg Oral Daily  . chlorpheniramine-HYDROcodone  5 mL Oral Q12H  . feeding supplement  237 mL Oral BID BM  . ferrous sulfate  325 mg Oral Q breakfast  . gabapentin  300 mg Oral BID WC  . gabapentin  600 mg Oral QHS  . guaiFENesin  600 mg Oral BID  . HYDROmorphone   Intravenous Q4H  . irbesartan  300 mg Oral Daily  . latanoprost  1 drop Both Eyes QHS  . lidocaine  1 patch Transdermal Q24H  . methylPREDNISolone (SOLU-MEDROL) injection  40 mg Intravenous Q24H  . multivitamin with minerals  1 tablet Oral Daily  .  pantoprazole  40 mg Oral Daily  . QUEtiapine  100 mg Oral QHS  . sertraline  100 mg Oral Daily  . sodium chloride flush  3 mL Intravenous Q12H  . tranexamic acid  500 mg Nebulization Q8H    Continuous Infusions: . ceFEPime (MAXIPIME) IV 2 g (03/22/21 1409)  . tranexamic acid      PRN Meds: acetaminophen, albuterol, diazepam, diphenhydrAMINE **OR** diphenhydrAMINE, fentaNYL (SUBLIMAZE) injection, HYDROmorphone, naloxone **AND** sodium chloride flush, ondansetron **OR** ondansetron (ZOFRAN) IV, ondansetron (ZOFRAN) IV,  polyethylene glycol  Physical Exam Vitals and nursing note reviewed.  Constitutional:      General: He is not in acute distress.    Appearance: He is ill-appearing.  Cardiovascular:     Rate and Rhythm: Normal rate.  Pulmonary:     Effort: Pulmonary effort is normal.  Neurological:     Mental Status: He is alert and oriented to person, place, and time.  Psychiatric:        Mood and Affect: Mood normal.            Vital Signs: BP 122/64 (BP Location: Right Arm)   Pulse 90   Temp 98.8 F (37.1 C) (Oral)   Resp 16   Ht 5\' 10"  (1.778 m)   Wt 79.4 kg   SpO2 97%   BMI 25.11 kg/m  SpO2: SpO2: 97 % O2 Device: O2 Device: Nasal Cannula O2 Flow Rate: O2 Flow Rate (L/min): 5 L/min  Intake/output summary:  Intake/Output Summary (Last 24 hours) at 03/22/2021 1730 Last data filed at 03/22/2021 0435 Gross per 24 hour  Intake --  Output 650 ml  Net -650 ml    LBM: Last BM Date: 03/20/21 Baseline Weight: Weight: 78.9 kg Most recent weight: Weight: 79.4 kg       Palliative Assessment/Data: 60%      Patient Active Problem List   Diagnosis Date Noted  . Palliative care by specialist   . 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 Uncontrolled pain 03/18/2021  . Leukocytosis 03/18/2021  . Normocytic anemia 03/18/2021  . Tooth abscess 03/18/2021  . Thrombocytosis 03/18/2021  . 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  . Acute on chronic respiratory failure  with hypoxia ([place]) 02/26/2021  . Malignant neoplasm of right upper lobe of lung ([place]) 02/26/2021  . Pneumonia of right lung due to infectious organism 02/26/2021  . Sepsis due to pneumonia ([place]) 02/26/2021  . Major depressive disorder 02/26/2021  . S/P lobectomy of lung 01/22/2021  . Essential hypertension 01/15/2021  . Mass of upper lobe of right lung 12/22/2020  . Hemoptysis 12/22/2020    Palliative Care Assessment & Plan   Patient Profile: 60 y.o. 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 Koleen Nimrod on 5/16, hypertension, anxiety, and depression admitted on 03/18/2021 with hemoptysis, shortness of breath, and uncontrolled cancer-related pain.   Palliative medicine has been consulted to assist with pain management and goals of care conversation.    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  Patient wonders whether there are reasons other than pain control to continue radiation treatments as an inpatient. Dilaudid PO is available PRN if issues with IV access arise again Psychosocial and emotional support provided Ongoing support from PMT  Goals of Care and Additional Recommendations: Limitations on Scope of Treatment: Full Scope Treatment  Code Status: Full   Code Status Orders  (From admission, onward)           Start     Ordered   03/18/21 1527  Full code  Continuous        03/18/21 1527           Code Status History  Date Active Date Inactive Code Status Order ID Comments User Context   02/26/2021 [id number] 03/07/2021  1843 Full Code [id number]  Vernelle Emerald, MD ED   01/22/2021 1529 01/26/2021 1518 Full Code [id number]  Elgie Collard, PA-C Inpatient       Prognosis:  Unable to determine  Discharge Planning: Home with Palliative Services  Care plan was discussed with patient   Total time: 25 minutes Greater than 50% of this time was spent in counseling and coordinating care related to the above assessment and plan.  Dorthy Cooler, PA-C Palliative Medicine Team Team phone # [phone]  Thank you for allowing the Palliative Medicine Team to assist in the care of this patient. Please utilize secure chat with additional questions, if there is no response within 30 minutes please call the above phone number.  Palliative Medicine Team providers are available by phone from 7am to 7pm daily and can be reached through the team cell phone.  Should this patient require assistance outside of these hours, please call the patient's attending physician.

## 2021-03-22 NOTE — Progress Notes (Signed)
PROGRESS NOTE    Christopher Carter  QZR:[id number] DOB: 05-22-1961 DOA: 03/18/2021 PCP: [name], MD   Chief Complain: Hemoptysis  Brief Narrative: Patient is a 60 year old male with history of adenocarcinoma of the lung status post right upper lobe lobectomy, chest wall dissection by Dr. Koleen Nimrod on 5/16, hypertension, anxiety, depression who presented with hemoptysis.  He was admitted recently from 6/19-6/29 for hemoptysis, shortness of breath.  At that time he was found to have loculated hemothorax requiring IR placement of a chest tube and empiric antibiotics with prednisone taper.  Chest tube was removed prior to discharge.  He has following with cardiothoracic surgery and radiation oncology.  Patient again developed persistent cough and coughed of blood more frequently than before.  He was also on oxygen at 4 L/min at home which was bumped to 6 L.  Patient was then brought to the emergency department.  On presentation he was hemodynamically stable.  Lab work showed leukocytosis, hemoglobin of 10.8.  CT chest showed airspace disease in the right lung representing alveolar hemorrhage with recurrence of apical fluid collection, nodular masslike lesion concerning for recurrence of malignancy, right peritracheal adenopathy.  PCCM, cardiothoracic surgery consulted.  Radiation oncology started on radiation treatment  Assessment & Plan:   Principal Problem:   Intra-alveolar hemorrhage Active Problems:   Essential hypertension   Acute on chronic respiratory failure with hypoxia (HCC)   Malignant neoplasm of right upper lobe of lung ([facility])   Major depressive disorder   Uncontrolled pain   Leukocytosis   Normocytic anemia   Tooth abscess   Thrombocytosis   Palliative care by specialist   Intra-alveolar hemorrhage: Has history of recurrent hematemesis, recently admitted in June for the same, at that time chest tube was placed and was removed on discharge.  He was following with  cardiothoracic surgery and radiation oncology. Presented with recurrent hemoptysis, increment  in the oxygen requirement.  Possibility of recurrence of malignancy/underlying infection. Continue to monitor respiratory status.  IR consulted for chest tube placement but the plan is currently on hold..  Cardiothoracic surgery, PCCM also consulted. Also underwent bronchoscopy  by PCCM without finding of any active bleeding. He is still having hemoptysis and is using suction.  Suction canister had some blood in it.  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 Koleen Nimrod on 5/16 Radiation oncology started radiation treatment He needs follow-up with oncology as an outpatient, Dr. Julien Nordmann  Chronic respiratory failure with hypoxia: On 4 L of oxygen per minute at home.Now at around baseline again  Uncontrolled pain: Currently on Dilaudid pump.  Palliative care consulted for pain control.  Leukocytosis: Elevated WBC at 18.2 on admission.  Most likely multifactorial causes including malignancy.  Continue to monitor.  Continue cefepime for now.  On reviewing his lab work, he has chronic leukocytosis.  Tooth abscess: He was on on penicillin V that was started as an outpatient.  Plan for root canal treatment on 7/11 this needs to be postponed.  Normocytic anemia: Hemoglobin has dropped to 8.8 today.  Was 11.5 on 7/7.  Could be associated with hemoptysis/alveolar hge.  We will continue to monitor.  Iron studies showed severe iron deficiency.  Given 1 dose of IV iron, started on oral supplementation.  Hypertension: Currently blood pressure stable.  Continue home amlodipine, irbesartan.  History of anxiety/depression: On Seroquel, Zoloft  GERD:On  Protonix  Nutrition Problem: Increased nutrient needs Etiology: cancer and cancer related treatments      DVT prophylaxis:SCD Code  Status: Full Family Communication: None at bedside Status is: Inpatient  Remains inpatient appropriate because:Inpatient level of care appropriate due to severity of illness  Dispo: The patient is from: Home              Anticipated d/c is to: Home              Patient currently is not medically stable to d/c.   Difficult to place patient No     Consultants: PCCM, IR, cardiothoracic surgery  Procedures: Bronchoscopy,Plan for chest tube placement  Antimicrobials:  Anti-infectives (From admission, onward)    Start     Dose/Rate Route Frequency Ordered Stop   03/19/21 0900  vancomycin (VANCOREADY) IVPB 1250 mg/250 mL  Status:  Discontinued        1,250 mg 166.7 mL/hr over 90 Minutes Intravenous Every 12 hours 03/18/21 1842 03/20/21 1059   03/18/21 2200  ceFEPIme (MAXIPIME) 2 g in sodium chloride 0.9 % 100 mL IVPB        2 g 200 mL/hr over 30 Minutes Intravenous Every 8 hours 03/18/21 1836     03/18/21 1930  vancomycin (VANCOREADY) IVPB 1500 mg/300 mL        1,500 mg 150 mL/hr over 120 Minutes Intravenous  Once 03/18/21 1836 03/19/21 0734   03/18/21 1915  azithromycin (ZITHROMAX) 500 mg in sodium chloride 0.9 % 250 mL IVPB  Status:  Discontinued        500 mg 250 mL/hr over 60 Minutes Intravenous Every 24 hours 03/18/21 1822 03/19/21 0840   03/18/21 1800  penicillin v potassium (VEETID) tablet 500 mg  Status:  Discontinued       Note to Pharmacy: Start date : 03/14/21     500 mg Oral 4 times daily 03/18/21 1527 03/18/21 1842       Subjective:  Patient seen and examined the bedside this morning.  Hemodynamically stable.  On 4 to 5 L of oxygen per minute.  He is still having hemoptysis and was frequently suctioning blood from his mouth.  Cough is better today.  Pain well controlled   Objective: Vitals:   03/22/21 0559 03/22/21 0716 03/22/21 0723 03/22/21 0748  BP:   133/66   Pulse:   (!) 105 (!) 111  Resp: 19 (!) 23 20 20   Temp:   98.2 F (36.8 C)   TempSrc:   Oral    SpO2: 98% 94% 92% 96%  Weight:      Height:        Intake/Output Summary (Last 24 hours) at 03/22/2021 0822 Last data filed at 03/22/2021 0435 Gross per 24 hour  Intake --  Output 650 ml  Net -650 ml   Filed Weights   03/18/21 0240 03/18/21 0242  Weight: 78.9 kg 79.4 kg    Examination:  General exam: Overall comfortable, not in distress HEENT: PERRL Respiratory system:  no wheezes or crackles , coarse breathing sounds Cardiovascular system: S1 & S2 heard, RRR.  Gastrointestinal system: Abdomen is nondistended, soft and nontender. Central nervous system: Alert and oriented Extremities: No edema, no clubbing ,no cyanosis Skin: No rashes, no ulcers,no icterus    Data Reviewed: I have personally reviewed following labs and imaging studies  CBC: Recent Labs  Lab 03/15/21 1254 03/18/21 0246 03/18/21 1551 03/18/21 2208 03/19/21 0215 03/20/21 0407 03/21/21 0057 03/22/21 0212  WBC 30.0* 18.1*  --   --  19.4* 20.2* 18.9* 17.2*  NEUTROABS 25.5* 13.2*  --   --   --  18.1* 15.1* 13.0*  HGB 11.5* 10.8*   < > 9.8* 9.4* 8.8* 8.5* 8.5*  HCT 35.7* 33.1*   < > 29.9* 28.8* 26.8* 26.9* 25.9*  MCV 93.9 90.9  --   --  92.3 92.4 94.1 93.2  PLT 574* 405*  --   --  352 304 372 361   < > = values in this interval not displayed.   Basic Metabolic Panel: Recent Labs  Lab 03/15/21 1254 03/18/21 0246 03/19/21 0215 03/20/21 0407  NA 133* 134* 130* 134*  K 4.5 4.0 4.2 3.8  CL 96* 98 95* 99  CO2 27 26 27 24   GLUCOSE 164* 172* 158* 211*  BUN 7 13 8 9   CREATININE 0.68 0.50* 0.51* 0.68  CALCIUM 9.3 9.0 8.5* 9.1   GFR: Estimated Creatinine Clearance: 103.9 mL/min (by C-G formula based on SCr of 0.68 mg/dL). Liver Function Tests: Recent Labs  Lab 03/15/21 1254  AST 12*  ALT 36  ALKPHOS 94  BILITOT 0.8  PROT 6.7  ALBUMIN 3.1*   No results for input(s): LIPASE, AMYLASE in the last 168 hours. No results for input(s): AMMONIA in the last 168 hours. Coagulation Profile: Recent  Labs  Lab 03/18/21 0246 03/18/21 1609 03/19/21 0215  INR 1.0 1.0 1.1   Cardiac Enzymes: No results for input(s): CKTOTAL, CKMB, CKMBINDEX, TROPONINI in the last 168 hours. BNP (last 3 results) No results for input(s): PROBNP in the last 8760 hours. HbA1C: No results for input(s): HGBA1C in the last 72 hours. CBG: Recent Labs  Lab 03/22/21 0721  GLUCAP 133*   Lipid Profile: No results for input(s): CHOL, HDL, LDLCALC, TRIG, CHOLHDL, LDLDIRECT in the last 72 hours. Thyroid Function Tests: No results for input(s): TSH, T4TOTAL, FREET4, T3FREE, THYROIDAB in the last 72 hours. Anemia Panel: Recent Labs    03/21/21 1008  FERRITIN 1,108*  TIBC 249*  IRON 15*   Sepsis Labs: Recent Labs  Lab 03/15/21 1254 03/18/21 1551 03/19/21 0215 03/20/21 0407  PROCALCITON  --  <0.10 0.12 <0.10  LATICACIDVEN 1.7  --   --   --     Recent Results (from the past 240 hour(s))  Resp Panel by RT-PCR (Flu A&B, Covid) Nasopharyngeal Swab     Status: None   Collection Time: 03/18/21  2:57 AM   Specimen: Nasopharyngeal Swab; Nasopharyngeal(NP) swabs in vial transport medium  Result Value Ref Range Status   SARS Coronavirus 2 by RT PCR NEGATIVE NEGATIVE Final    Comment: (NOTE) SARS-CoV-2 target nucleic acids are NOT DETECTED.  The SARS-CoV-2 RNA is generally detectable in upper respiratory specimens during the acute phase of infection. The lowest concentration of SARS-CoV-2 viral copies this assay can detect is 138 copies/mL. A negative result does not preclude SARS-Cov-2 infection and should not be used as the sole basis for treatment or other patient management decisions. A negative result may occur with  improper specimen collection/handling, submission of specimen other than nasopharyngeal swab, presence of viral mutation(s) within the areas targeted by this assay, and inadequate number of viral copies(<138 copies/mL). A negative result must be combined with clinical observations,  patient history, and epidemiological information. The expected result is Negative.  Fact Sheet for Patients:  EntrepreneurPulse.com.au  Fact Sheet for Healthcare Providers:  IncredibleEmployment.be  This test is no t yet approved or cleared by the [place] FDA and  has been authorized for detection and/or diagnosis  of SARS-CoV-2 by FDA under an Emergency Use Authorization (EUA). This EUA will remain  in effect (meaning this test can be used) for the duration of the COVID-19 declaration under Section 564(b)(1) of the Act, 21 U.S.C.section 360bbb-3(b)(1), unless the authorization is terminated  or revoked sooner.       Influenza A by PCR NEGATIVE NEGATIVE Final   Influenza B by PCR NEGATIVE NEGATIVE Final    Comment: (NOTE) The Xpert Xpress SARS-CoV-2/FLU/RSV plus assay is intended as an aid in the diagnosis of influenza from Nasopharyngeal swab specimens and should not be used as a sole basis for treatment. Nasal washings and aspirates are unacceptable for Xpert Xpress SARS-CoV-2/FLU/RSV testing.  Fact Sheet for Patients: EntrepreneurPulse.com.au  Fact Sheet for Healthcare Providers: IncredibleEmployment.be  This test is not yet approved or cleared by the [place] FDA and has been authorized for detection and/or diagnosis of SARS-CoV-2 by FDA under an Emergency Use Authorization (EUA). This EUA will remain in effect (meaning this test can be used) for the duration of the COVID-19 declaration under Section 564(b)(1) of the Act, 21 U.S.C. section 360bbb-3(b)(1), unless the authorization is terminated or revoked.  Performed at [facility], [address], [place], [place] [address]   MRSA Next Gen by PCR, Nasal     Status: None   Collection Time: 03/19/21  8:41 AM   Specimen: Nasal Mucosa; Nasal Swab  Result Value Ref Range Status   MRSA by PCR Next Gen NOT DETECTED NOT  DETECTED Final    Comment: (NOTE) The GeneXpert MRSA Assay (FDA approved for NASAL specimens only), is one component of a comprehensive MRSA colonization surveillance program. It is not intended to diagnose MRSA infection nor to guide or monitor treatment for MRSA infections. Test performance is not FDA approved in patients less than 16 years old. Performed at [facility], [place] [address], [place], [place] [address]   Expectorated Sputum Assessment w Gram Stain, Rflx to Resp Cult     Status: None   Collection Time: 03/20/21  9:24 PM   Specimen: Sputum  Result Value Ref Range Status   Specimen Description SPUTUM  Final   Special Requests NONE  Final   Sputum evaluation   Final    THIS SPECIMEN IS ACCEPTABLE FOR SPUTUM CULTURE Performed at [facility], [place] [address] [place], [place] [address]    Report Status 03/21/2021 FINAL  Final  Culture, Respiratory w Gram Stain     Status: None (Preliminary result)   Collection Time: 03/20/21  9:24 PM   Specimen: SPU  Result Value Ref Range Status   Specimen Description SPUTUM  Final   Special Requests NONE Reflexed from [id number]  Final   Gram Stain   Final    FEW WBC PRESENT,BOTH PMN AND MONONUCLEAR FEW GRAM POSITIVE COCCI RARE GRAM NEGATIVE RODS Performed at [facility], [address], [place],  [address]    Culture PENDING  Incomplete   Report Status PENDING  Incomplete         Radiology Studies: No results found.      Scheduled Meds:  albuterol  2.5 mg Nebulization Q6H   amLODipine  10 mg Oral Daily   chlorpheniramine-HYDROcodone  5 mL Oral Q12H   feeding supplement  237 mL Oral BID BM   ferrous sulfate  325 mg Oral Q breakfast   gabapentin  300 mg Oral BID WC   gabapentin  600 mg Oral QHS   guaiFENesin  600 mg Oral BID   HYDROmorphone   Intravenous Q4H   irbesartan  300 mg Oral Daily   latanoprost  1 drop Both Eyes QHS   lidocaine  1 patch Transdermal Q24H   multivitamin with minerals   1 tablet Oral Daily   pantoprazole  40 mg Oral Daily   predniSONE  10 mg Oral Q breakfast   QUEtiapine  100 mg Oral QHS   sertraline  100 mg Oral Daily   sodium chloride flush  3 mL Intravenous Q12H   tranexamic acid  500 mg Nebulization Q8H   Continuous Infusions:  ceFEPime (MAXIPIME) IV 2 g (03/22/21 0529)   tranexamic acid       LOS: 3 days    Time spent:25 mins. More than 50% of that time was spent in counseling and/or coordination of care.      Shelly Coss, MD [facility] P7/14/2022, 8:22 AM

## 2021-03-22 NOTE — Plan of Care (Signed)

## 2021-03-22 NOTE — Progress Notes (Signed)
NAME:  Christopher Carter, MRN:  [id number], DOB:  1961/04/17, LOS: 3 ADMISSION DATE:  03/18/2021, CONSULTATION DATE:  03/18/2021 REFERRING MD:  Dr. Tamala Julian, CHIEF COMPLAINT:  Hemoptysis    History of Present Illness:  Christopher Carter is a 60 y.o. 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 Patient has been treated at this facility for similar presentation and underwent video bronchoscopy and IR placed chest tube for pleural effusion with Dr. Roxan Hockey. He was discharged in stable condition with plans to follow up with cardiothoracic surgery and oncology.  He presented this admission with recurrent hemoptysis that started 4hr prior to admission with associated right upper back pain that radiates to upper neck. Patient was transferred from [place] to [facility] for cardiothoracic, pulmonary, and IR consults. Vital signs stable, WBC elevated at 18.1. CT chest was obtained and consistent with alveolar hemorrhage localized to the right with right apical fluid collection.   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 He is still having pain but feels like he is in a fog. Coughing less, still some hemoptysis.  Objective   Blood pressure 133/66, pulse (!) 111, temperature 98.2 F (36.8 C), temperature source Oral, resp. rate 20, height 5\' 10"  (1.778 m), weight 79.4 kg, SpO2 96 %.    FiO2 (%):  [40 %] 40 %    Intake/Output Summary (Last 24 hours) at 03/22/2021 0929 Last data filed at 03/22/2021 0435 Gross per 24 hour  Intake --  Output 650 ml  Net -650 ml    Filed Weights   03/18/21 0240 03/18/21 0242  Weight: 78.9 kg 79.4 kg    Examination: General: Chronically ill appearing man lying in bed in NAD. Remains on dilaudid PCA. HEENT: [place]/AT, eyes anicteric Cardiac: S1S2, RRR Respiratory: still faint rhonchi bilaterally, all sputum is dark blood, not bright. Breathing comfortably on [place]. Abdomen: soft, nontender, nondistended Extremities: no c/c/e Skin: warm, dry, no rashes Neuro: awake but less alert today, moving all extremities  WBC 17.2 H/H 8.5/ 25.9 Trach aspirate > few WBC, few GPC, rare GNR  Resolved Hospital Problem list     Assessment & Plan:  Acute on chronic hypoxic respiratory failure  Recurrent hemoptysis - presumed to be infectious in etiology. No focal source of bleeding identified on bronchoscopy. Loculated right pleural effusion apically-- suspect this is cancer related. Stage IV lung adenocarcinoma now s/p right upper lobectomy  Intractable cancer associated pain -Possible nerve involvement as well as the bony invasion of the transverse processes of the spine seen at the time of surgery, plus pleural mets.  This is not typical post-thoracotomy pain. Acute on chronic anemia -Appreciate TCTS's management. No drain needed for effusion at this point- this may be a loculated effusion ex-vacuo from lobectomy vs effusion related to mets. - Con't TXA nebs. May need to give another dose of IV TXA. - Start solumedrol 40mg  daily today - Continue breathing treatments Q6h + Q2h PRN.   - Continue supplemental oxygen  as required to maintain SPO2 greater than 90%. - Agree with PCA for pain control until an appropriate oral or [place] regimen can be established. Appreciate Palliative Care team's management. -Transfuse for hemoglobin less than 7 or hemodynamically significant bleeding.    -Continue holding chemical DVT prophylaxis. Can be restarted when hemoptysis resolved as he is high risk for VTE. -Radiation  -Needs an OP appointment with oncology set up before discharge-- Dr. Earlie Server is aware that he is admitted.  Tooth abscess  -hold PCN while on cefepime  -will need root canal eventually     Deconditioning -needs PT, OT  Best Practice   Diet/type: Regular consistency (see orders) DVT prophylaxis: SCD GI prophylaxis: N/A Lines: N/A Foley:  N/A Code Status:  full code Last date of multidisciplinary goals of care discussion: Per primary   Labs   CBC: Recent Labs  Lab 03/15/21 1254 03/18/21 0246 03/18/21 1551 03/18/21 2208 03/19/21 0215 03/20/21 0407 03/21/21 0057 03/22/21 0212  WBC 30.0* 18.1*  --   --  19.4* 20.2* 18.9* 17.2*  NEUTROABS 25.5* 13.2*  --   --   --  18.1* 15.1* 13.0*  HGB 11.5* 10.8*   < > 9.8* 9.4* 8.8* 8.5* 8.5*  HCT 35.7* 33.1*   < > 29.9* 28.8* 26.8* 26.9* 25.9*  MCV 93.9 90.9  --   --  92.3 92.4 94.1 93.2  PLT 574* 405*  --   --  352 304 372 361   < > = values in this interval not displayed.     Basic Metabolic Panel: Recent Labs  Lab 03/15/21 1254 03/18/21 0246 03/19/21 0215 03/20/21 0407  NA 133* 134* 130* 134*  K 4.5 4.0 4.2 3.8  CL 96* 98 95* 99  CO2 27 26 27 24   GLUCOSE 164* 172* 158* 211*  BUN 7 13 8 9   CREATININE 0.68 0.50* 0.51* 0.68  CALCIUM 9.3 9.0 8.5* 9.1    GFR: Estimated Creatinine Clearance: 103.9 mL/min (by C-G formula based on SCr of 0.68 mg/dL). Recent Labs  Lab 03/15/21 1254 03/18/21 0246 03/18/21 1551 03/19/21 0215 03/20/21 0407 03/21/21 0057 03/22/21 0212  PROCALCITON  --   --  <0.10 0.12 <0.10  --   --   WBC 30.0*   < >  --  19.4* 20.2* 18.9* 17.2*  LATICACIDVEN 1.7  --   --   --   --   --   --    < > = values in this interval not displayed.      [name], DO 03/22/21 11:09 AM [facility]

## 2021-03-22 NOTE — Progress Notes (Addendum)
Daily Progress Note   Patient Name: Christopher Carter       Date: 03/22/2021 DOB: 01/24/1961  Age: 60 y.o. MRN#: [id number] Attending Physician: Shelly Coss, MD Primary Care Physician: [name], MD Admit Date: 03/18/2021  Reason for Consultation/Follow-up: Pain control  Subjective: Feels "a little out of it today". He is exhausted, but reports much better pain control. He had a hard time with positioning in radiation due to pain.   Length of Stay: 3  Current Medications: Scheduled Meds:   albuterol  2.5 mg Nebulization Q6H   amLODipine  10 mg Oral Daily   chlorpheniramine-HYDROcodone  5 mL Oral Q12H   feeding supplement  237 mL Oral BID BM   ferrous sulfate  325 mg Oral Q breakfast   gabapentin  300 mg Oral BID WC   gabapentin  600 mg Oral QHS   guaiFENesin  600 mg Oral BID   HYDROmorphone   Intravenous Q4H   irbesartan  300 mg Oral Daily   latanoprost  1 drop Both Eyes QHS   lidocaine  1 patch Transdermal Q24H   multivitamin with minerals  1 tablet Oral Daily   pantoprazole  40 mg Oral Daily   predniSONE  10 mg Oral Q breakfast   QUEtiapine  100 mg Oral QHS   sertraline  100 mg Oral Daily   sodium chloride flush  3 mL Intravenous Q12H   tranexamic acid  500 mg Nebulization Q8H    Continuous Infusions:  ceFEPime (MAXIPIME) IV 2 g (03/22/21 0529)   tranexamic acid      PRN Meds: acetaminophen, albuterol, diazepam, diphenhydrAMINE **OR** diphenhydrAMINE, fentaNYL (SUBLIMAZE) injection, HYDROmorphone, naloxone **AND** sodium chloride flush, ondansetron **OR** ondansetron (ZOFRAN) IV, ondansetron (ZOFRAN) IV, polyethylene glycol  Physical Exam Vitals and nursing note reviewed.  Constitutional:      General: He is not in acute distress.    Appearance: He is  ill-appearing.  Cardiovascular:     Rate and Rhythm: Normal rate.  Pulmonary:     Effort: Pulmonary effort is normal.  Neurological:     Mental Status: He is alert and oriented to person, place, and time.  Psychiatric:        Mood and Affect: Mood normal.            Vital Signs: BP Marland Kitchen)  156/74 (BP Location: Right Arm)   Pulse 96   Temp 99.3 F (37.4 C) (Oral)   Resp 19   Ht 5\' 10"  (1.778 m)   Wt 79.4 kg   SpO2 98%   BMI 25.11 kg/m  SpO2: SpO2: 98 % O2 Device: O2 Device: Nasal Cannula O2 Flow Rate: O2 Flow Rate (L/min): 5 L/min  Intake/output summary:  Intake/Output Summary (Last 24 hours) at 03/22/2021 0708 Last data filed at 03/22/2021 0435 Gross per 24 hour  Intake --  Output 650 ml  Net -650 ml    LBM: Last BM Date: 03/20/21 Baseline Weight: Weight: 78.9 kg Most recent weight: Weight: 79.4 kg       Palliative Assessment/Data: 60%      Patient Active Problem List   Diagnosis Date Noted   Palliative care by specialist    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   Uncontrolled pain 03/18/2021   Leukocytosis 03/18/2021   Normocytic anemia 03/18/2021   Tooth abscess 03/18/2021   Thrombocytosis 03/18/2021   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 Koleen Nimrod on 5/16, hypertension, anxiety, and depression admitted on 03/18/2021 with hemoptysis, shortness of  breath, and uncontrolled cancer-related pain.   Palliative medicine has been consulted to assist with pain management and goals of care conversation.    Assessment: Stage IV lung adenocarcinoma now s/p right upper lobectomy Acute on chronic hypoxic respiratory failure, improved to baseline Recurrent hemoptysis, improving Uncontrolled cancer-related pain, improving   Recommendations/Plan: Added Valium and Fentanyl PRN for use 30 minutes prior to radiation. Maintain dilaudid PCA basal dose to 1.5mg /hr, ongoing management Dilaudid PO is available PRN if issues with IV access arise again Psychosocial and emotional support provided Ongoing support from PMT  Goals of Care and Additional Recommendations: Limitations on Scope of Treatment: Full Scope Treatment  Code Status: Full   Code Status Orders  (From admission, onward)           Start     Ordered   03/18/21 1527  Full code  Continuous        03/18/21 1527           Code Status History     Date Active Date Inactive Code Status Order ID Comments User Context   02/26/2021 0613 03/07/2021 1843 Full Code [id number]  Vernelle Emerald, MD ED   01/22/2021 1529 01/26/2021 1518 Full Code [id number]  Elgie Collard, PA-C Inpatient       Prognosis:  Unable to determine  Discharge Planning: Home with Palliative Services  Care plan was discussed with patient, Dr. Hilma Favors   Total time: 35 minutes Greater than 50% of this time was spent in counseling and coordinating care related to the above assessment and plan.  Dorthy Cooler, PA-C Palliative Medicine Team Team phone # [phone]  Thank you for allowing the Palliative Medicine Team to assist in the care of this patient.  Please utilize secure chat with additional questions, if there is no response within 30 minutes please call the above phone number.  Palliative Medicine Team providers are available by phone from 7am to 7pm daily and can be reached through the team  cell phone.  Should this patient require assistance outside of these hours, please call the patient's attending physician.

## 2021-03-23 ENCOUNTER — Ambulatory Visit
Admit: 2021-03-23 | Discharge: 2021-03-23 | Disposition: A | Discharge status: Home / Self Care | Payer: 59 | Attending: Radiation Oncology | Admitting: Radiation Oncology

## 2021-03-23 ENCOUNTER — Inpatient Hospital Stay: Payer: 59 | Admitting: Primary Care

## 2021-03-23 DIAGNOSIS — J9621 Acute and chronic respiratory failure with hypoxia: Secondary | ICD-10-CM | POA: Diagnosis not present

## 2021-03-23 DIAGNOSIS — R52 Pain, unspecified: Secondary | ICD-10-CM | POA: Diagnosis not present

## 2021-03-23 DIAGNOSIS — D649 Anemia, unspecified: Secondary | ICD-10-CM | POA: Diagnosis not present

## 2021-03-23 DIAGNOSIS — C3411 Malignant neoplasm of upper lobe, right bronchus or lung: Secondary | ICD-10-CM | POA: Diagnosis not present

## 2021-03-23 DIAGNOSIS — R042 Hemoptysis: Secondary | ICD-10-CM | POA: Diagnosis not present

## 2021-03-23 DIAGNOSIS — R0489 Hemorrhage from other sites in respiratory passages: Secondary | ICD-10-CM | POA: Diagnosis not present

## 2021-03-23 DIAGNOSIS — Z515 Encounter for palliative care: Secondary | ICD-10-CM | POA: Diagnosis not present

## 2021-03-23 LAB — HEMOGLOBIN A1C
Hgb A1c MFr Bld: 6.5 % — ABNORMAL HIGH (ref 4.8–5.6)
Mean Plasma Glucose: 139.85 mg/dL

## 2021-03-23 LAB — CBC WITH DIFFERENTIAL/PLATELET
Abs Immature Granulocytes: 0.2 10*3/uL — ABNORMAL HIGH (ref 0.00–0.07)
Basophils Absolute: 0 10*3/uL (ref 0.0–0.1)
Basophils Relative: 0 %
Eosinophils Absolute: 0.2 10*3/uL (ref 0.0–0.5)
Eosinophils Relative: 1 %
HCT: 27.1 % — ABNORMAL LOW (ref 39.0–52.0)
Hemoglobin: 8.6 g/dL — ABNORMAL LOW (ref 13.0–17.0)
Immature Granulocytes: 1 %
Lymphocytes Relative: 3 %
Lymphs Abs: 0.7 10*3/uL (ref 0.7–4.0)
MCH: 30.1 pg (ref 26.0–34.0)
MCHC: 31.7 g/dL (ref 30.0–36.0)
MCV: 94.8 fL (ref 80.0–100.0)
Monocytes Absolute: 1.8 10*3/uL — ABNORMAL HIGH (ref 0.1–1.0)
Monocytes Relative: 8 %
Neutro Abs: 20.3 10*3/uL — ABNORMAL HIGH (ref 1.7–7.7)
Neutrophils Relative %: 87 %
Platelets: 377 10*3/uL (ref 150–400)
RBC: 2.86 MIL/uL — ABNORMAL LOW (ref 4.22–5.81)
RDW: 14.7 % (ref 11.5–15.5)
WBC: 23.3 10*3/uL — ABNORMAL HIGH (ref 4.0–10.5)
nRBC: 0 % (ref 0.0–0.2)

## 2021-03-23 LAB — BASIC METABOLIC PANEL
Anion gap: 8 (ref 5–15)
BUN: 10 mg/dL (ref 6–20)
CO2: 28 mmol/L (ref 22–32)
Calcium: 9.2 mg/dL (ref 8.9–10.3)
Chloride: 98 mmol/L (ref 98–111)
Creatinine, Ser: 0.52 mg/dL — ABNORMAL LOW (ref 0.61–1.24)
GFR, Estimated: >60 mL/min (ref >60)
Glucose, Bld: 171 mg/dL — ABNORMAL HIGH (ref 70–99)
Potassium: 4.1 mmol/L (ref 3.5–5.1)
Sodium: 134 mmol/L — ABNORMAL LOW (ref 135–145)

## 2021-03-23 LAB — GLUCOSE, CAPILLARY
Glucose-Capillary: 173 mg/dL — ABNORMAL HIGH (ref 70–99)
Glucose-Capillary: 191 mg/dL — ABNORMAL HIGH (ref 70–99)
Glucose-Capillary: 233 mg/dL — ABNORMAL HIGH (ref 70–99)
Glucose-Capillary: 246 mg/dL — ABNORMAL HIGH (ref 70–99)

## 2021-03-23 MED ORDER — HYDROMORPHONE 1 MG/ML IV SOLN
INTRAVENOUS | Status: DC
  Administered 2021-03-24: 30 mg via INTRAVENOUS
  Filled 2021-03-23: qty 30

## 2021-03-23 MED ORDER — HYDROMORPHONE HCL 1 MG/ML IJ SOLN
1.0000 mg | INTRAMUSCULAR | Status: DC | PRN
  Administered 2021-03-23 – 2021-03-26 (×4): 1 mg via INTRAVENOUS
  Filled 2021-03-23 (×4): qty 1

## 2021-03-23 MED ORDER — MORPHINE SULFATE ER 15 MG PO TBCR: 150.0000 mg | EXTENDED_RELEASE_TABLET | Freq: Two times a day (BID) | ORAL | Status: DC

## 2021-03-23 MED ORDER — MORPHINE SULFATE ER 15 MG PO TBCR
150.0000 mg | EXTENDED_RELEASE_TABLET | Freq: Two times a day (BID) | ORAL | Status: DC
  Administered 2021-03-23: 150 mg via ORAL
  Filled 2021-03-23: qty 10

## 2021-03-23 MED ORDER — HYDROMORPHONE 1 MG/ML IV SOLN: INTRAVENOUS | Status: DC

## 2021-03-23 MED ORDER — INSULIN ASPART 100 UNIT/ML IJ SOLN
0.0000 [IU] | Freq: Three times a day (TID) | INTRAMUSCULAR | Status: DC
  Administered 2021-03-23: 3 [IU] via SUBCUTANEOUS
  Administered 2021-03-23: 2 [IU] via SUBCUTANEOUS
  Administered 2021-03-24: 1 [IU] via SUBCUTANEOUS
  Administered 2021-03-24 (×2): 2 [IU] via SUBCUTANEOUS
  Administered 2021-03-25: 5 [IU] via SUBCUTANEOUS
  Administered 2021-03-25: 1 [IU] via SUBCUTANEOUS
  Administered 2021-03-25: 2 [IU] via SUBCUTANEOUS
  Administered 2021-03-26: 5 [IU] via SUBCUTANEOUS
  Administered 2021-03-26: 1 [IU] via SUBCUTANEOUS
  Administered 2021-03-26: 2 [IU] via SUBCUTANEOUS
  Administered 2021-03-27: 3 [IU] via SUBCUTANEOUS

## 2021-03-23 NOTE — Progress Notes (Addendum)
PROGRESS NOTE    Christopher Carter  RSW:[id number] DOB: 02/01/1961 DOA: 03/18/2021 PCP: [name], MD   Chief Complain: Hemoptysis  Brief Narrative: Patient is a 60 year old male with history of adenocarcinoma of the lung status post right upper lobe lobectomy, chest wall dissection by Dr. Koleen Nimrod on 5/16, hypertension, anxiety, depression who presented with hemoptysis.  He was admitted recently from 6/19-6/29 for hemoptysis, shortness of breath.  At that time he was found to have loculated hemothorax requiring IR placement of a chest tube and empiric antibiotics with prednisone taper.  Chest tube was removed prior to discharge.  He has following with cardiothoracic surgery and radiation oncology.  Patient again developed persistent cough and coughed of blood more frequently than before.  He was also on oxygen at 4 L/min at home which was bumped to 6 L.  Patient was then brought to the emergency department.  On presentation he was hemodynamically stable.  Lab work showed leukocytosis, hemoglobin of 10.8.  CT chest showed airspace disease in the right lung representing alveolar hemorrhage with recurrence of apical fluid collection, nodular masslike lesion concerning for recurrence of malignancy, right peritracheal adenopathy.  PCCM, cardiothoracic surgery consulted.  Radiation oncology started on radiation treatment  Assessment & Plan:   Principal Problem:   Intra-alveolar hemorrhage Active Problems:   Essential hypertension   Acute on chronic respiratory failure with hypoxia (HCC)   Malignant neoplasm of right upper lobe of lung (HCC)   Major depressive disorder   Uncontrolled pain   Leukocytosis   Normocytic anemia   Tooth abscess   Thrombocytosis   Palliative care by specialist   Intra-alveolar hemorrhage: Has history of recurrent hematemesis, recently admitted in June for the same, at that time chest tube was placed and was removed on discharge.  He was following with  cardiothoracic surgery and radiation oncology. Presented with recurrent hemoptysis, increment  in the oxygen requirement.  Possibility of recurrence of malignancy/underlying infection. Continue to monitor respiratory status.  IR consulted for chest tube placement but the plan is currently on hold..  Cardiothoracic surgery, PCCM also consulted. Also underwent bronchoscopy  by PCCM without finding of any active bleeding.  PCCM started on  steroid.  Hemoptysis has improved.  He feels better today. 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 Koleen Nimrod on 5/16 Radiation oncology started radiation treatment He needs follow-up with oncology as an outpatient, Dr. Julien Nordmann  Chronic respiratory failure with hypoxia: On 4 L of oxygen per minute at home.Now at around baseline again  Uncontrolled pain: Currently on Dilaudid pump.  Palliative care consulted for pain control.  Leukocytosis: Elevated WBC at 18.2 on admission.  Most likely multifactorial causes including malignancy.  Continue to monitor.  Continue cefepime for now.  On reviewing his lab work, he has chronic leukocytosis.  Tooth abscess: He was on on penicillin V that was started as an outpatient.  Plan for root canal treatment on 7/11 this needs to be postponed.  Normocytic anemia: Hemoglobin has dropped to 8.8 today.  Was 11.5 on 7/7.  Could be associated with hemoptysis/alveolar hge.  We will continue to monitor.  Iron studies showed severe iron deficiency.  Given 1 dose of IV iron, started on oral supplementation.  Hypertension: Currently blood pressure stable.  Continue home amlodipine, irbesartan.  History of anxiety/depression: On Seroquel, Zoloft  GERD:On  Protonix    Nutrition  Problem: Increased nutrient needs Etiology: cancer and cancer related treatments      DVT  prophylaxis:SCD Code Status: Full Family Communication: None at bedside Status is: Inpatient  Remains inpatient appropriate because:Inpatient level of care appropriate due to severity of illness  Dispo: The patient is from: Home              Anticipated d/c is to: Home              Patient currently is not medically stable to d/c.   Difficult to place patient No  Still on PCA pump   Consultants: PCCM, IR, cardiothoracic surgery  Procedures: Bronchoscopy,Plan for chest tube placement  Antimicrobials:  Anti-infectives (From admission, onward)    Start     Dose/Rate Route Frequency Ordered Stop   03/19/21 0900  vancomycin (VANCOREADY) IVPB 1250 mg/250 mL  Status:  Discontinued        1,250 mg 166.7 mL/hr over 90 Minutes Intravenous Every 12 hours 03/18/21 1842 03/20/21 1059   03/18/21 2200  ceFEPIme (MAXIPIME) 2 g in sodium chloride 0.9 % 100 mL IVPB        2 g 200 mL/hr over 30 Minutes Intravenous Every 8 hours 03/18/21 1836     03/18/21 1930  vancomycin (VANCOREADY) IVPB 1500 mg/300 mL        1,500 mg 150 mL/hr over 120 Minutes Intravenous  Once 03/18/21 1836 03/19/21 0734   03/18/21 1915  azithromycin (ZITHROMAX) 500 mg in sodium chloride 0.9 % 250 mL IVPB  Status:  Discontinued        500 mg 250 mL/hr over 60 Minutes Intravenous Every 24 hours 03/18/21 1822 03/19/21 0840   03/18/21 1800  penicillin v potassium (VEETID) tablet 500 mg  Status:  Discontinued       Note to Pharmacy: Start date : 03/14/21     500 mg Oral 4 times daily 03/18/21 1527 03/18/21 1842       Subjective:  Patient seen and examined at the bedside this morning.  Hemodynamically stable.  He looks better today.  He feels better.  Cough is better and he is noticing less hemoptysis.  Wondering when he can go home.   Objective: Vitals:   03/23/21 0817 03/23/21 1103 03/23/21 1130 03/23/21 1204  BP:   (!) 126/48 (!) 111/50  Pulse:   (!) 111 97  Resp: 18 18 17 14   Temp:    98.2 F (36.8 C)  TempSrc:     Oral  SpO2: 94% 100% 99% 100%  Weight:      Height:        Intake/Output Summary (Last 24 hours) at 03/23/2021 1314 Last data filed at 03/23/2021 1103 Gross per 24 hour  Intake 963.17 ml  Output 701 ml  Net 262.17 ml   Filed Weights   03/18/21 0240 03/18/21 0242  Weight: 78.9 kg 79.4 kg    Examination:  General exam: Overall comfortable, not in distress HEENT: PERRL Respiratory system:  no wheezes or crackles  Cardiovascular system: S1 & S2 heard, RRR.  Gastrointestinal system: Abdomen is nondistended, soft and nontender. Central nervous system: Alert and oriented Extremities: No edema, no clubbing ,no cyanosis Skin: No rashes, no ulcers,no icterus    Data Reviewed: I have personally reviewed following labs and imaging studies  CBC: Recent Labs  Lab 03/18/21 0246 03/18/21 1551 03/19/21 0215 03/20/21 0407 03/21/21 0057 03/22/21 0212 03/23/21 0532  WBC 18.1*  --  19.4* 20.2* 18.9* 17.2* 23.3*  NEUTROABS 13.2*  --   --  18.1* 15.1* 13.0* 20.3*  HGB 10.8*   < > 9.4* 8.8* 8.5* 8.5* 8.6*  HCT 33.1*   < > 28.8* 26.8* 26.9* 25.9* 27.1*  MCV 90.9  --  92.3 92.4 94.1 93.2 94.8  PLT 405*  --  352 304 372 361 377   < > = values in this interval not displayed.   Basic Metabolic Panel: Recent Labs  Lab 03/18/21 0246 03/19/21 0215 03/20/21 0407 03/23/21 0532  NA 134* 130* 134* 134*  K 4.0 4.2 3.8 4.1  CL 98 95* 99 98  CO2 26 27 24 28   GLUCOSE 172* 158* 211* 171*  BUN 13 8 9 10   CREATININE 0.50* 0.51* 0.68 0.52*  CALCIUM 9.0 8.5* 9.1 9.2   GFR: Estimated Creatinine Clearance: 103.9 mL/min (A) (by C-G formula based on SCr of 0.52 mg/dL (L)). Liver Function Tests: No results for input(s): AST, ALT, ALKPHOS, BILITOT, PROT, ALBUMIN in the last 168 hours.  No results for input(s): LIPASE, AMYLASE in the last 168 hours. No results for input(s): AMMONIA in the last 168 hours. Coagulation Profile: Recent Labs  Lab 03/18/21 0246 03/18/21 1609 03/19/21 0215  INR  1.0 1.0 1.1   Cardiac Enzymes: No results for input(s): CKTOTAL, CKMB, CKMBINDEX, TROPONINI in the last 168 hours. BNP (last 3 results) No results for input(s): PROBNP in the last 8760 hours. HbA1C: No results for input(s): HGBA1C in the last 72 hours. CBG: Recent Labs  Lab 03/22/21 1512 03/22/21 1910 03/22/21 2314 03/23/21 0306 03/23/21 1202  GLUCAP 225* 209* 201* 173* 191*   Lipid Profile: No results for input(s): CHOL, HDL, LDLCALC, TRIG, CHOLHDL, LDLDIRECT in the last 72 hours. Thyroid Function Tests: No results for input(s): TSH, T4TOTAL, FREET4, T3FREE, THYROIDAB in the last 72 hours. Anemia Panel: Recent Labs    03/21/21 1008  FERRITIN 1,108*  TIBC 249*  IRON 15*   Sepsis Labs: Recent Labs  Lab 03/18/21 1551 03/19/21 0215 03/20/21 0407  PROCALCITON <0.10 0.12 <0.10    Recent Results (from the past 240 hour(s))  Resp Panel by RT-PCR (Flu A&B, Covid) Nasopharyngeal Swab     Status: None   Collection Time: 03/18/21  2:57 AM   Specimen: Nasopharyngeal Swab; Nasopharyngeal(NP) swabs in vial transport medium  Result Value Ref Range Status   SARS Coronavirus 2 by RT PCR NEGATIVE NEGATIVE Final    Comment: (NOTE) SARS-CoV-2 target nucleic acids are NOT DETECTED.  The SARS-CoV-2 RNA is generally detectable in upper respiratory specimens during the acute phase of infection. The lowest concentration of SARS-CoV-2 viral copies this assay can detect is 138 copies/mL. A negative result does not preclude SARS-Cov-2 infection and should not be used as the sole basis for treatment or other patient management decisions. A negative result may occur with  improper specimen collection/handling, submission of specimen other than nasopharyngeal swab, presence of viral mutation(s) within the areas targeted by this assay, and inadequate number of viral copies(<138 copies/mL). A negative result must be combined with clinical observations, patient history, and  epidemiological information. The expected result is Negative.  Fact Sheet for Patients:  EntrepreneurPulse.com.au  Fact Sheet for Healthcare Providers:  IncredibleEmployment.be  This test is no t yet approved or cleared by the [place] FDA and  has been authorized for detection and/or diagnosis of SARS-CoV-2 by FDA under an Emergency Use Authorization (EUA). This EUA will remain  in effect (meaning this test can be used) for the duration of the COVID-19 declaration under Section 564(b)(1) of the Act, 21  U.S.C.section 360bbb-3(b)(1), unless the authorization is terminated  or revoked sooner.       Influenza A by PCR NEGATIVE NEGATIVE Final   Influenza B by PCR NEGATIVE NEGATIVE Final    Comment: (NOTE) The Xpert Xpress SARS-CoV-2/FLU/RSV plus assay is intended as an aid in the diagnosis of influenza from Nasopharyngeal swab specimens and should not be used as a sole basis for treatment. Nasal washings and aspirates are unacceptable for Xpert Xpress SARS-CoV-2/FLU/RSV testing.  Fact Sheet for Patients: EntrepreneurPulse.com.au  Fact Sheet for Healthcare Providers: IncredibleEmployment.be  This test is not yet approved or cleared by the [place] FDA and has been authorized for detection and/or diagnosis of SARS-CoV-2 by FDA under an Emergency Use Authorization (EUA). This EUA will remain in effect (meaning this test can be used) for the duration of the COVID-19 declaration under Section 564(b)(1) of the Act, 21 U.S.C. section 360bbb-3(b)(1), unless the authorization is terminated or revoked.  Performed at [facility], [address] [place], [place] [address]   MRSA Next Gen by PCR, Nasal     Status: None   Collection Time: 03/19/21  8:41 AM   Specimen: Nasal Mucosa; Nasal Swab  Result Value Ref Range Status   MRSA by PCR Next Gen NOT DETECTED NOT DETECTED Final     Comment: (NOTE) The GeneXpert MRSA Assay (FDA approved for NASAL specimens only), is one component of a comprehensive MRSA colonization surveillance program. It is not intended to diagnose MRSA infection nor to guide or monitor treatment for MRSA infections. Test performance is not FDA approved in patients less than 42 years old. Performed at [facility], [place] [address], [place], [place] [address]   Expectorated Sputum Assessment w Gram Stain, Rflx to Resp Cult     Status: None   Collection Time: 03/20/21  9:24 PM   Specimen: Sputum  Result Value Ref Range Status   Specimen Description SPUTUM  Final   Special Requests NONE  Final   Sputum evaluation   Final    THIS SPECIMEN IS ACCEPTABLE FOR SPUTUM CULTURE Performed at [facility], [place] [address], [place], [place] [address]    Report Status 03/21/2021 FINAL  Final  Culture, Respiratory w Gram Stain     Status: None (Preliminary result)   Collection Time: 03/20/21  9:24 PM   Specimen: SPU  Result Value Ref Range Status   Specimen Description SPUTUM  Final   Special Requests NONE Reflexed from [id number]  Final   Gram Stain   Final    FEW WBC PRESENT,BOTH PMN AND MONONUCLEAR FEW GRAM POSITIVE COCCI RARE GRAM NEGATIVE RODS    Culture   Final    RARE Normal respiratory flora-no Staph aureus or Pseudomonas seen Performed at [facility], [place] [address], [place], [place] [address]    Report Status PENDING  Incomplete         Radiology Studies: No results found.      Scheduled Meds:  albuterol  2.5 mg Nebulization Q6H   amLODipine  10 mg Oral Daily   chlorpheniramine-HYDROcodone  5 mL Oral Q12H   feeding supplement  237 mL Oral BID BM   ferrous sulfate  325 mg Oral Q breakfast   gabapentin  300 mg Oral BID WC   gabapentin  600 mg Oral QHS   guaiFENesin  600 mg Oral BID   HYDROmorphone   Intravenous Q4H   insulin aspart  0-9 Units Subcutaneous TID WC   irbesartan  300 mg Oral Daily   latanoprost  1  drop Both Eyes QHS   lidocaine  1 patch Transdermal Q24H   methylPREDNISolone (SOLU-MEDROL) injection  40 mg Intravenous Q24H   multivitamin with minerals  1 tablet Oral Daily   pantoprazole  40 mg Oral Daily   QUEtiapine  100 mg Oral QHS   sertraline  100 mg Oral Daily   sodium chloride flush  3 mL Intravenous Q12H   tranexamic acid  500 mg Nebulization Q8H   Continuous Infusions:  ceFEPime (MAXIPIME) IV 2 g (03/23/21 0444)   tranexamic acid       LOS: 4 days    Time spent:25 mins. More than 50% of that time was spent in counseling and/or coordination of care.      Shelly Coss, MD [facility] P7/15/2022, 1:14 PM

## 2021-03-23 NOTE — Progress Notes (Signed)
Daily Progress Note   Patient Name: Christopher Carter       Date: 03/23/2021 DOB: 04/08/1961  Age: 60 y.o. MRN#: [id number] Attending Physician: Shelly Coss, MD Primary Care Physician: [name], MD Admit Date: 03/18/2021  Reason for Consultation/Follow-up: pain management  Discussed with RN.  Worked with RN to decipher documentation over the last 24 hours regarding PCA pump.  Documentation style changes significantly between shifts and it is very difficult to determine what the patient has received.  Queried PCA pump with attending MD to start the process of migrating to an oral regimen.  Subjective: Patient reports there was a problem with the PCA pump last night and it stopped working.  But other than that he has been fairly comfortable today.  Received fentanyl at radiation.  Assessment: Patient appears comfortable.   Over the last 24 hours he has received 63.18 mg of dilaudid.  This equates to 1263 mg of PO morphine.   Reduce dosing by 25% for cross tolerance = 884 mg of PO morphine.   Divided by two is over 400 mg of morphine q 12 hours.   Patient Profile/HPI:  60 y.o. 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 Koleen Nimrod on 5/16, hypertension, anxiety, and depression admitted on 03/18/2021 with hemoptysis, shortness of breath, and uncontrolled cancer-related pain.  Length of Stay: 4   Vital Signs: BP (!) 115/52   Pulse 97   Temp 98.4 F (36.9 C) (Oral)   Resp 14   Ht 5\' 10"  (1.778 m)   Wt 79.4 kg   SpO2 100%   BMI 25.11 kg/m  SpO2: SpO2: 100 % O2 Device: O2 Device: Nasal Cannula O2 Flow Rate: O2 Flow Rate (L/min): 5 L/min       Palliative Assessment/Data:   50%     Palliative Care Plan    Recommendations/Plan: Will turn off basal rate on PCA pump this evening Will initiate MS Contin at 150 mg q 12 hours Will increase bolus rate on pump to 0.5 mg Will add a PRN dilaudid injection (for back up) PMT will reassess tomorrow to continue working towards a PO outpatient regimen.  Code Status:  Full code  Prognosis:  Unable to determine   Discharge Planning: Home  with Home Health and palliative   Care plan was discussed with RN, PMT Attending.  Thank you for allowing the Palliative Medicine Team to assist in the care of this patient.  Total time spent:  35 min.     Greater than 50%  of this time was spent counseling and coordinating care related to the above assessment and plan.  Florentina Jenny, PA-C Palliative Medicine  Please contact Palliative MedicineTeam phone at [phone] for questions and concerns between 7 am - 7 pm.   Please see AMION for individual provider pager numbers.

## 2021-03-23 NOTE — Plan of Care (Signed)

## 2021-03-23 NOTE — Plan of Care (Signed)
  Problem: Education: Goal: Knowledge of General Education information will improve Description: Including pain rating scale, medication(s)/side effects and non-pharmacologic comfort measures 03/23/2021 2300 by Burnice Logan, LPN Outcome: Progressing 03/23/2021 2300 by Burnice Logan, LPN Outcome: Progressing   Problem: Health Behavior/Discharge Planning: Goal: Ability to manage health-related needs will improve 03/23/2021 2300 by Burnice Logan, LPN Outcome: Progressing 03/23/2021 2300 by Burnice Logan, LPN Outcome: Progressing   Problem: Clinical Measurements: Goal: Ability to maintain clinical measurements within normal limits will improve 03/23/2021 2300 by Burnice Logan, LPN Outcome: Progressing 03/23/2021 2300 by Burnice Logan, LPN Outcome: Progressing Goal: Will remain free from infection 03/23/2021 2300 by Burnice Logan, LPN Outcome: Progressing 03/23/2021 2300 by Burnice Logan, LPN Outcome: Progressing Goal: Diagnostic test results will improve 03/23/2021 2300 by Burnice Logan, LPN Outcome: Progressing 03/23/2021 2300 by Burnice Logan, LPN Outcome: Progressing Goal: Respiratory complications will improve Outcome: Progressing Goal: Cardiovascular complication will be avoided Outcome: Progressing   Problem: Activity: Goal: Risk for activity intolerance will decrease Outcome: Progressing   Problem: Nutrition: Goal: Adequate nutrition will be maintained Outcome: Progressing   Problem: Coping: Goal: Level of anxiety will decrease Outcome: Progressing   Problem: Elimination: Goal: Will not experience complications related to bowel motility Outcome: Progressing Goal: Will not experience complications related to urinary retention Outcome: Progressing   Problem: Pain Managment: Goal: General experience of comfort will improve Outcome: Progressing   Problem: Safety: Goal: Ability to remain free from injury will  improve Outcome: Progressing   Problem: Skin Integrity: Goal: Risk for impaired skin integrity will decrease Outcome: Progressing   Problem: Activity: Goal: Ability to tolerate increased activity will improve Outcome: Progressing   Problem: Clinical Measurements: Goal: Ability to maintain a body temperature in the normal range will improve Outcome: Progressing   Problem: Respiratory: Goal: Ability to maintain adequate ventilation will improve Outcome: Progressing Goal: Ability to maintain a clear airway will improve Outcome: Progressing

## 2021-03-23 NOTE — Progress Notes (Signed)
NAME:  OLEY LAHAIE, MRN:  [id number], DOB:  Jan 03, 1961, LOS: 4 ADMISSION DATE:  03/18/2021, CONSULTATION DATE:  03/18/2021 REFERRING MD:  Dr. Tamala Julian, CHIEF COMPLAINT:  Hemoptysis    History of Present Illness:  Christopher Carter is a 60 y.o. male with a PMH significant for Stage IV adenocarcinoma now S/P right upper lobectomy with node dissection 01/22/2021, on 5L [place] at baseline, tobacco abuse, HTN, and depression who presented to [facility] for recurrent hemoptysis. Patient has been treated at this facility for similar presentation and underwent video bronchoscopy and IR placed chest tube for pleural effusion with Dr. Roxan Hockey. He was discharged in stable condition with plans to follow up with cardiothoracic surgery and oncology.  He presented this admission with recurrent hemoptysis that started 4hr prior to admission with associated right upper back pain that radiates to upper neck. Patient was transferred from [place] to [facility] for cardiothoracic, pulmonary, and IR consults. Vital signs stable, WBC elevated at 18.1. CT chest was obtained and consistent with alveolar hemorrhage localized to the right with right apical fluid collection.   Pertinent  Medical History  Stage IIIa adenocarcinoma now S/P right upper lobectomy with node dissection 01/22/2021, on 5L [place] at baseline, tobacco abuse, HTN, and depression   Significant Hospital Events:  7/10 admitted with recurrent hemoptysis  7/11 bronch- nonfocal source, had bloody frothy secretions in all airways, petechiae on bronchi  Interim History / Subjective:  Reports pain is better still on PCA  Objective   Blood pressure (!) 125/58, pulse (!) 108, temperature 98.1 F (36.7 C), temperature source Oral, resp. rate 18, height 5\' 10"  (1.778 m), weight 79.4 kg, SpO2 94 %.    FiO2 (%):  [40 %] 40 %   Intake/Output Summary (Last 24 hours) at 03/23/2021 1017 Last data filed at 03/23/2021 1000 Gross per 24 hour  Intake  940 ml  Output 700 ml  Net 240 ml   Filed Weights   03/18/21 0240 03/18/21 0242  Weight: 78.9 kg 79.4 kg    Examination: General: Well-nourished well-developed male no acute distress HEENT: MM pink/moist Neuro: Intact without focal defect CV: Sounds regular regular rate and rhythm  PULM: Diminished in the bases 2 to 3 L 92% sats GI: soft, bsx4 active  GU: Voids Extremities: warm/dry,  edema  Skin: no rashes or lesions   Labs reviewed  Resolved Hospital Problem list     Assessment & Plan:  Acute on chronic hypoxic respiratory failure  Recurrent hemoptysis - presumed to be infectious in etiology. No focal source of bleeding identified on bronchoscopy. Loculated right pleural effusion apically-- suspect this is cancer related. Stage IV lung adenocarcinoma now s/p right upper lobectomy  Intractable cancer associated pain -Possible nerve involvement as well as the bony invasion of the transverse processes of the spine seen at the time of surgery, plus pleural mets.  This is not typical post-thoracotomy pain.  Appreciate thoracic surgery's involvement no need for chest tube at this time Continue nebulizers May need repeat dose of IV TXA Solu-Medrol 40 mg daily transition to p.o. in near future Currently with PCA for pain control this will need to be stopped to use oral agent prior to discharge Transfuse per protocol Continue to hold DVT prophylaxis continue to hold DVT prophylaxis Resume when hemoptysis has resolved Follow-up with oncology as an outpatient Radiation thing treatments Q6h + Q2h PRN.   O2 as needed  Tooth abscess  Antibiotics and root canal   Deconditioning PT OT  Best Practice   Diet/type: Regular consistency (see orders) DVT prophylaxis: SCD GI prophylaxis: N/A Lines: N/A Foley:  N/A Code Status:  full code Last date of multidisciplinary goals of care discussion: Per primary   Labs   CBC: Recent Labs  Lab 03/18/21 0246 03/18/21 1551  03/19/21 0215 03/20/21 0407 03/21/21 0057 03/22/21 0212 03/23/21 0532  WBC 18.1*  --  19.4* 20.2* 18.9* 17.2* 23.3*  NEUTROABS 13.2*  --   --  18.1* 15.1* 13.0* 20.3*  HGB 10.8*   < > 9.4* 8.8* 8.5* 8.5* 8.6*  HCT 33.1*   < > 28.8* 26.8* 26.9* 25.9* 27.1*  MCV 90.9  --  92.3 92.4 94.1 93.2 94.8  PLT 405*  --  352 304 372 361 377   < > = values in this interval not displayed.    Basic Metabolic Panel: Recent Labs  Lab 03/18/21 0246 03/19/21 0215 03/20/21 0407 03/23/21 0532  NA 134* 130* 134* 134*  K 4.0 4.2 3.8 4.1  CL 98 95* 99 98  CO2 26 27 24 28   GLUCOSE 172* 158* 211* 171*  BUN 13 8 9 10   CREATININE 0.50* 0.51* 0.68 0.52*  CALCIUM 9.0 8.5* 9.1 9.2   GFR: Estimated Creatinine Clearance: 103.9 mL/min (A) (by C-G formula based on SCr of 0.52 mg/dL (L)). Recent Labs  Lab 03/18/21 1551 03/19/21 0215 03/20/21 0407 03/21/21 0057 03/22/21 0212 03/23/21 0532  PROCALCITON <0.10 0.12 <0.10  --   --   --   WBC  --  19.4* 20.2* 18.9* 17.2* 23.3*    Steve Arlys Scatena ACNP Acute Care Nurse Practitioner [place] Please consult Amion 03/23/2021, 10:17 AM

## 2021-03-23 NOTE — Progress Notes (Addendum)
PCA Administration Total Administration doses in 18hrs 14.83 mg.

## 2021-03-23 NOTE — Progress Notes (Signed)
New dilaudid syringe loaded in PCA pump, 89ml wasted with 2nd RN (see in pyxis), Pump cleared at this time, hung early due to pt being transferred to [facility] for radiation treatment and would not have enough pain medication for treatment. Pt remains at 1.5mg /hr with 0.3mg  demand dose with lock out interval of 38min and a max dose of 5mg  in and hour. Pt pain 7/10 at this time and reports this is his baseline and is comfortable.

## 2021-03-23 NOTE — Progress Notes (Signed)
[facility] arrived to take pt to [facility] for Radiation therapy. PCA transferred with pt

## 2021-03-23 NOTE — Progress Notes (Signed)
Pt arrived back to room from radiation treatment. Pt having pain. Pt did received 13mcg of fentanyl during treatment. PCA remains in place.

## 2021-03-24 ENCOUNTER — Inpatient Hospital Stay (HOSPITAL_COMMUNITY): Payer: 59

## 2021-03-24 DIAGNOSIS — Z515 Encounter for palliative care: Secondary | ICD-10-CM | POA: Diagnosis not present

## 2021-03-24 DIAGNOSIS — R042 Hemoptysis: Secondary | ICD-10-CM | POA: Diagnosis not present

## 2021-03-24 DIAGNOSIS — R0489 Hemorrhage from other sites in respiratory passages: Secondary | ICD-10-CM | POA: Diagnosis not present

## 2021-03-24 DIAGNOSIS — C3411 Malignant neoplasm of upper lobe, right bronchus or lung: Secondary | ICD-10-CM | POA: Diagnosis not present

## 2021-03-24 DIAGNOSIS — R52 Pain, unspecified: Secondary | ICD-10-CM | POA: Diagnosis not present

## 2021-03-24 LAB — CBC WITH DIFFERENTIAL/PLATELET
Abs Immature Granulocytes: 0.22 10*3/uL — ABNORMAL HIGH (ref 0.00–0.07)
Basophils Absolute: 0 10*3/uL (ref 0.0–0.1)
Basophils Relative: 0 %
Eosinophils Absolute: 0.4 10*3/uL (ref 0.0–0.5)
Eosinophils Relative: 2 %
HCT: 25.2 % — ABNORMAL LOW (ref 39.0–52.0)
Hemoglobin: 8.1 g/dL — ABNORMAL LOW (ref 13.0–17.0)
Immature Granulocytes: 1 %
Lymphocytes Relative: 3 %
Lymphs Abs: 0.7 10*3/uL (ref 0.7–4.0)
MCH: 30 pg (ref 26.0–34.0)
MCHC: 32.1 g/dL (ref 30.0–36.0)
MCV: 93.3 fL (ref 80.0–100.0)
Monocytes Absolute: 1.7 10*3/uL — ABNORMAL HIGH (ref 0.1–1.0)
Monocytes Relative: 7 %
Neutro Abs: 20.7 10*3/uL — ABNORMAL HIGH (ref 1.7–7.7)
Neutrophils Relative %: 87 %
Platelets: 412 10*3/uL — ABNORMAL HIGH (ref 150–400)
RBC: 2.7 MIL/uL — ABNORMAL LOW (ref 4.22–5.81)
RDW: 15 % (ref 11.5–15.5)
WBC: 23.7 10*3/uL — ABNORMAL HIGH (ref 4.0–10.5)
nRBC: 0 % (ref 0.0–0.2)

## 2021-03-24 LAB — PHOSPHORUS: Phosphorus: 3.2 mg/dL (ref 2.5–4.6)

## 2021-03-24 LAB — CULTURE, RESPIRATORY W GRAM STAIN: Culture: NORMAL

## 2021-03-24 LAB — GLUCOSE, CAPILLARY
Glucose-Capillary: 136 mg/dL — ABNORMAL HIGH (ref 70–99)
Glucose-Capillary: 166 mg/dL — ABNORMAL HIGH (ref 70–99)
Glucose-Capillary: 196 mg/dL — ABNORMAL HIGH (ref 70–99)

## 2021-03-24 LAB — MAGNESIUM: Magnesium: 2.1 mg/dL (ref 1.7–2.4)

## 2021-03-24 IMAGING — DX DG CHEST 1V PORT
1 series · 1 of 1 positions shown · non-contrast
Comparison: Radiograph [DATE], CT [DATE]

CLINICAL DATA: Respiration abnormal

EXAM:
PORTABLE CHEST 1 VIEW

[chest ap]
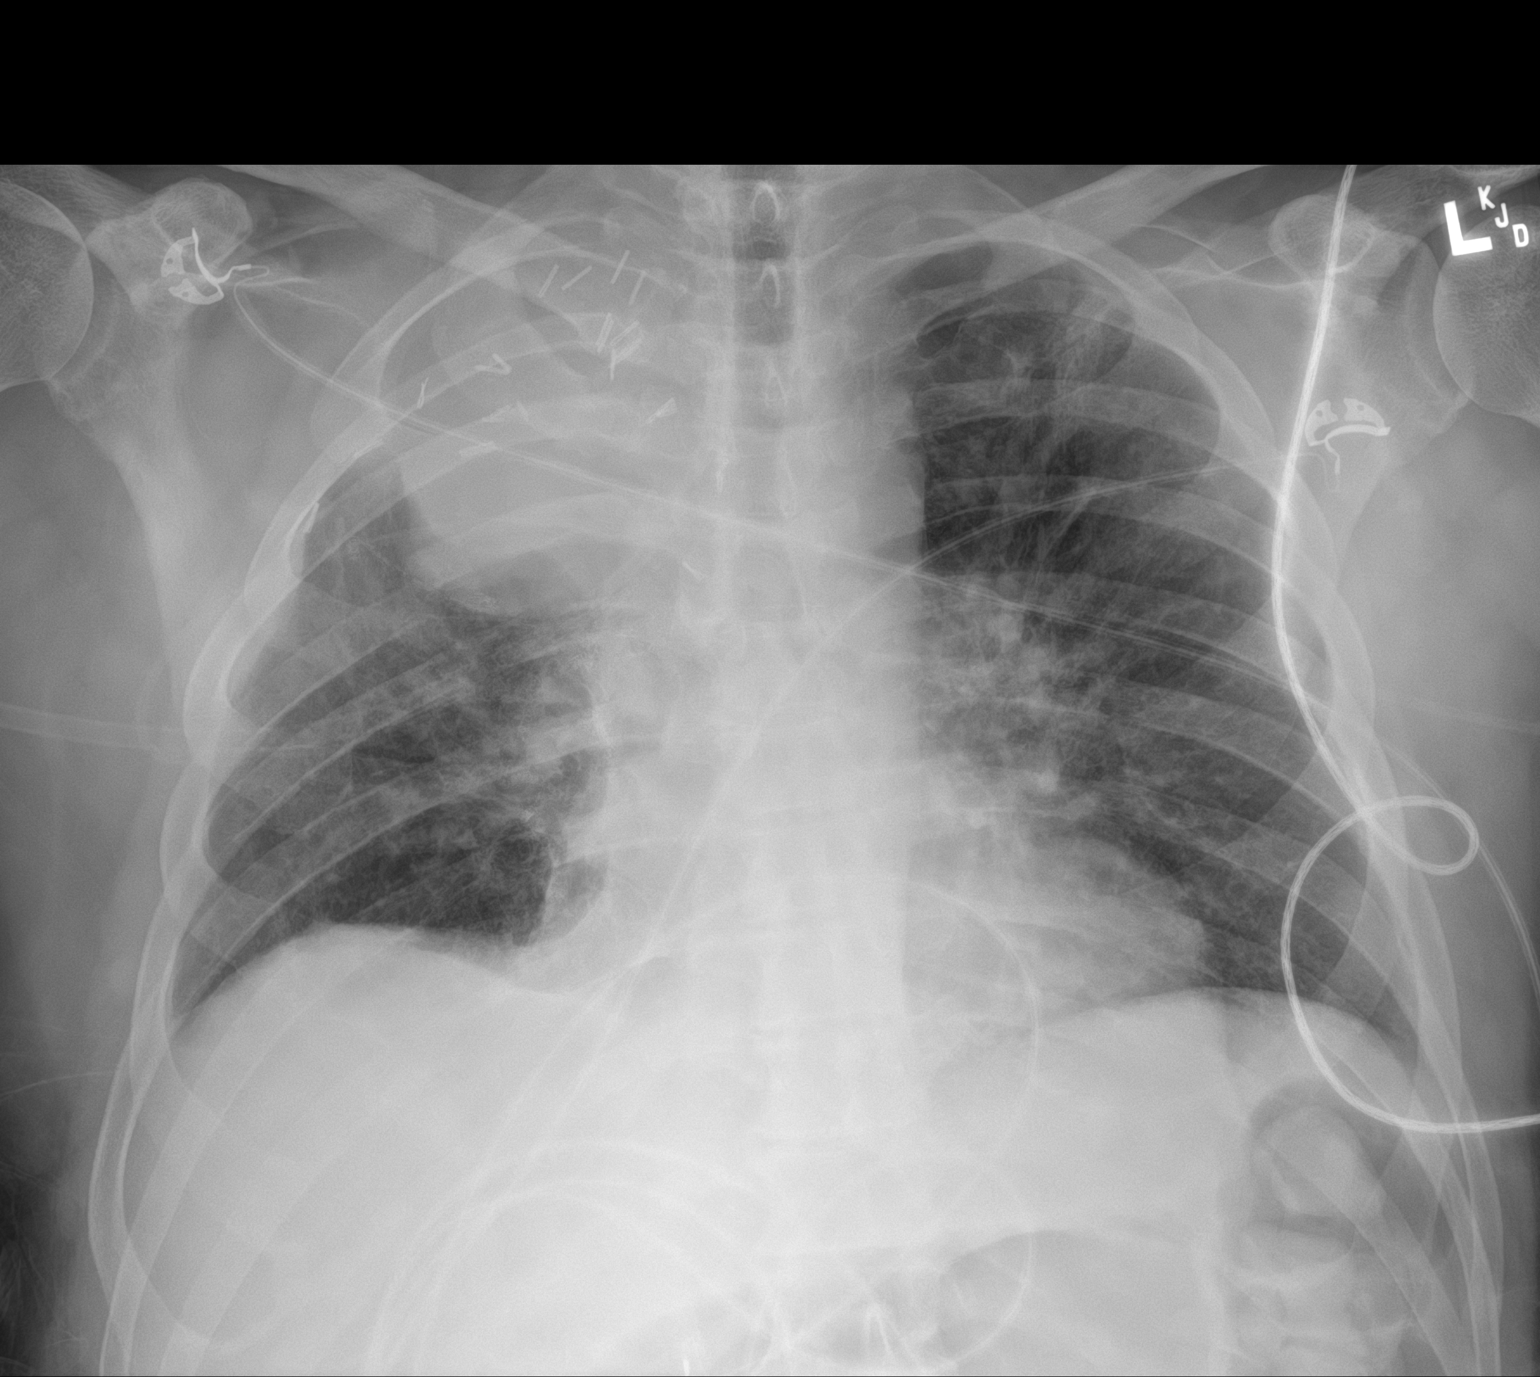

[1 of 1 positions shown; findings below may reference images not displayed]

FINDINGS: Large RIGHT upper lobe apical mass again demonstrated. Stable
cardiac silhouette. Mild central venous congestion is slightly
increased from prior. No pneumothorax. No pleural fluid.
IMPRESSION: 1. Mild increase in venous pulmonary congestion.
2. Stable RIGHT apical mass.

## 2021-03-24 MED ORDER — DIAZEPAM 2 MG PO TABS
2.0000 mg | ORAL_TABLET | Freq: Three times a day (TID) | ORAL | Status: DC | PRN
  Administered 2021-03-25: 2 mg via ORAL
  Filled 2021-03-24 (×3): qty 1

## 2021-03-24 MED ORDER — DIAZEPAM 2 MG PO TABS
2.0000 mg | ORAL_TABLET | Freq: Three times a day (TID) | ORAL | Status: DC
  Administered 2021-03-24: 2 mg via ORAL

## 2021-03-24 MED ORDER — MORPHINE SULFATE ER 100 MG PO TBCR
200.0000 mg | EXTENDED_RELEASE_TABLET | Freq: Two times a day (BID) | ORAL | Status: DC
  Administered 2021-03-24: 200 mg via ORAL
  Filled 2021-03-24: qty 2

## 2021-03-24 MED ORDER — HYDROMORPHONE 1 MG/ML IV SOLN: INTRAVENOUS | Status: DC

## 2021-03-24 MED ORDER — HYDROMORPHONE 1 MG/ML IV SOLN
INTRAVENOUS | Status: DC
  Administered 2021-03-25: 22.8 mg via INTRAVENOUS
  Administered 2021-03-25: 30 mg via INTRAVENOUS
  Administered 2021-03-25: 16.72 mg via INTRAVENOUS
  Administered 2021-03-26: 15.2 mL via INTRAVENOUS
  Administered 2021-03-26: 6.54 mL via INTRAVENOUS
  Administered 2021-03-26: 18.81 mg via INTRAVENOUS
  Administered 2021-03-26: 6.11 mg via INTRAVENOUS
  Administered 2021-03-26: 30 mg via INTRAVENOUS
  Filled 2021-03-24 (×2): qty 30

## 2021-03-24 NOTE — Progress Notes (Signed)
Daily Progress Note   Patient Name: Christopher Carter       Date: 03/24/2021 DOB: 1961-04-26  Age: 60 y.o. MRN#: [id number] Attending Physician: Christopher Coss, MD Primary Care Physician: [name], MD Admit Date: 03/18/2021  Reason for Consultation/Follow-up: Symptom management  Patient used 63 mg of dilaudid in the previous 24 hours 7/15 and his pain was controlled.  We attempted to begin to convert him to an outpatient regimen by starting MS Contin last evening and stopping his basal PCA rate.  Unfortunately the MS contin dose was too low.  In addition the bolus dose was increased in the orders but not reflected on the pump.  Patient suffered in pain overnight.  Subjective: Patient tells me "I'm just trying to deal with the pain right now".   Shows me on my back that his pain starts in his mid right back travels up to his shoulder and down his right arm.  Is is constant and severe.   Current meds including MS contin 150 mg and PC bolus only of 0.3 along with a PRN of 1.0 mg of dilaudid is not nearly controlling his pain.  Wife arrives at bedside.  She is very reasonable and understanding but also very frustrated.  "It feels like ground hogs day"  every time there is a change in personnel he is in severe pain again.  Or the IV needs to be changed and he is in severe pain - then it takes time to get him back under control.  It keeps happening again and again.  We talked about the multiple components of Christopher Carter's pain :  physical pain intensified by anxiety, the pain associated with his diagnosis and multiple hospitalizations - it is certain multifactorial.   [name] shares that Christopher Carter's anxiety and depression were well controlled prior to his recent diagnosis and hospitalizations.  I shared that  the amount of pain medication he requires is quite unusual and likely our staff is unfamiliar with giving so much - it can be frightening - the last thing we want to do is cause any harm.   [name] understands.  [name] shares that her husband is on short term disability from [place] where he was an Clinical biochemist and a Fish farm manager.  He will have 6.5 weeks of daily radiation (m-f) and then  start chemo.  She shares his cancer is a stage 3.   The original surgery 5/16 was to remove a 9 cm tumor.  Margins were not clear.   I asked [name] if Christopher Carter's treatment was Palliative or Curative.  She did not know.  She stated they had not really even talked to the oncologist yet.  I committed to round on Christopher Carter again later today to make certain his pain is controlled.   Assessment: Patient's pain is uncontrolled.  Anxiety quite elevated.   I did notice one myoclonic jerking motion - Christopher Carter said he was drifting off to sleep.    Pump query:  6.5 mg dilaudid, 21 demands, 20 delivered in 12 hours.   pCA bolus was 0.3 mg q 8 min prn. MS contin 150 given at 6:20 pm.  Two prn doses of dilaudid 1mg  were given by the RN in the last 12 hours.  Patient Profile/HPI:   60 y.o. 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 Koleen Nimrod on 5/16, hypertension, anxiety, and depression admitted on 03/18/2021 with hemoptysis, shortness of breath, and uncontrolled cancer-related pain.    Length of Stay: 5   Vital Signs: BP (!) 137/99 (BP Location: Left Arm)   Pulse (!) 108   Temp 98.5 F (36.9 C) (Oral)   Resp 16   Ht 5\' 10"  (1.778 m)   Wt 79.4 kg   SpO2 100%   BMI 25.11 kg/m  SpO2: SpO2: 100 % O2 Device: O2 Device: Nasal Cannula O2 Flow Rate: O2 Flow Rate (L/min): 4 L/min       Palliative Assessment/Data:  40%     Palliative Care Plan    Recommendations/Plan: Discussed PCA pump and pain medication regimen at length with RN, Charge,  and a traveler RN who was familiar with PCA pumps.  PCA programmed correctly to give basal dose at 1 mg / hour, bolus at 0.3 mg q 8 min prn. Discussed pain medication regimen with attending physician and recalculated.  Will increase MS contin to 200 mg bid.  Anticipate it will continue to be titrated up as the PCA comes down. Discussed transfer to [facility] Oncology floor with [facility] attending.  Patient is due for radiation on Monday.  The Oncology floor likely is more familiar with PCA pumps than [facility] staff. Will discuss his case with Dr. Hilma Favors as a potential for outpatient management in the [facility] clinic.    Code Status:  Full code  Prognosis:  Unable to determine   Discharge Planning: Home with Home Health and Palliative outpatient at [facility] clinic.  Care plan was discussed with Patient and wife, RN, Charge, [facility] attending, PMT Attending.  Thank you for allowing the Palliative Medicine Team to assist in the care of this patient.  Total time spent:  90 min. Time in 9:10 Time out 10:40.     Greater than 50%  of this time was spent counseling and coordinating care related to the above assessment and plan.  Florentina Jenny, PA-C Palliative Medicine  Please contact Palliative MedicineTeam phone at [phone] for questions and concerns between 7 am - 7 pm.   Please see AMION for individual provider pager numbers.

## 2021-03-24 NOTE — Progress Notes (Signed)
Patient has been awake most of the shift, pt is concerned about his pain meds, he's been observing PCA pump syringe to make sure there's enough pain meds. Pt remains at baseline at this time.

## 2021-03-24 NOTE — Progress Notes (Signed)
Talked with wife on the phone.  She is at bedside.  She tells me the jerking has continued since I left.     Discussed with PMT attending.  Will be very watchful for possible OIN.  Will DC MS Contin and continue on Dilaudid PCA.  Will add low dose valium for myoclonic jerking.  Will consider rotating from dilaudid to Fentanyl tomorrow if not better.  PMT director is familiar with the patient and situation.  PMT following closely.  Florentina Jenny, PA-C Palliative Medicine Office:  [phone]

## 2021-03-24 NOTE — Progress Notes (Signed)
PROGRESS NOTE    Christopher Carter  GYI:[id number] DOB: [birth year] DOA: 03/18/2021 PCP: [name], MD   Chief Complain: Hemoptysis  Brief Narrative: Patient is a 60 year old male with history of adenocarcinoma of the lung status post right upper lobe lobectomy, chest wall dissection by Dr. Koleen Nimrod on 5/16, hypertension, anxiety, depression who presented with hemoptysis.  He was admitted recently from 6/19-6/29 for hemoptysis, shortness of breath.  At that time he was found to have loculated hemothorax requiring IR placement of a chest tube and empiric antibiotics with prednisone taper.  Chest tube was removed prior to discharge.  He has following with cardiothoracic surgery and radiation oncology.  Patient again developed persistent cough and coughed of blood more frequently than before.  He was also on oxygen at 4 L/min at home which was bumped to 6 L.  Patient was then brought to the emergency department.  On presentation he was hemodynamically stable.  Lab work showed leukocytosis, hemoglobin of 10.8.  CT chest showed airspace disease in the right lung representing alveolar hemorrhage with recurrence of apical fluid collection, nodular masslike lesion concerning for recurrence of malignancy, right peritracheal adenopathy.  PCCM, cardiothoracic surgery consulted.  Radiation oncology started on radiation treatment for lung cancer.  Currently hemoptysis has been improving but due to severe pain, he is on PCA pump, palliative care following for this.  Due to  ongoing radiation treatment, he will be transferred to [facility].  Assessment & Plan:   Principal Problem:   Intra-alveolar hemorrhage Active Problems:   Essential hypertension   Acute on chronic respiratory failure with hypoxia (HCC)   Malignant neoplasm of right upper lobe of lung (HCC)   Major depressive disorder   Uncontrolled pain   Leukocytosis   Normocytic anemia   Tooth abscess   Thrombocytosis   Palliative care  by specialist   Palliative care encounter   Intra-alveolar hemorrhage: Has history of recurrent hemoptysis, recently admitted in June for the same, at that time chest tube was placed and was removed on discharge.  He was following with cardiothoracic surgery and radiation oncology. Presented with recurrent hemoptysis, increment  in the oxygen requirement.  Thought to be due to recurrence of malignancy/underlying infection.  IR consulted for chest tube placement but the plan is currently on hold..  Cardiothoracic surgery, PCCM were also consulted. He underwent bronchoscopy  by PCCM without finding of any active bleeding.  PCCM started on  IV steroid for persistent hemoptysis.  Hemoptysis has so far improved.He is also on tranexamic acid nebulization. Will request PCCM to continue following at [facility].  I have sent message to Dr. 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 Koleen Nimrod on 5/16 Radiation oncology started radiation treatment, so far 3 cycles of treatment done. He needs eventually follow-up with oncology as an outpatient, Dr. Julien Nordmann  Chronic respiratory failure with hypoxia: On 4 L of oxygen per minute at home.Now at around baseline again  Uncontrolled pain: Currently on Dilaudid pump.  Palliative care consulted and following for pump.  He will be taken care of by Dr. Domingo Cocking at [facility]  Leukocytosis:   Most likely multifactorial causes including malignancy.  On reviewing his lab work, he has chronic leukocytosis.  Tooth abscess: He was on on penicillin V  that was started as an outpatient.  Plan for root canal treatment on 7/11, it has been  postponed.  Normocytic anemia: Hemoglobin has dropped to the range of 8.  Was 11.5 on 7/7.   Could be associated with hemoptysis/alveolar hge.  We will continue to monitor.  Iron studies showed severe iron deficiency.  Given 1 dose of IV iron, started on oral supplementation.  Hypertension: Currently blood pressure stable.  Continue home amlodipine, irbesartan.  History of anxiety/depression: On Seroquel, Zoloft  GERD:On  Protonix    Nutrition Problem: Increased nutrient needs Etiology: cancer and cancer related treatments      DVT prophylaxis:SCD Code Status: Full Family Communication:: Discussed with wife on phone today Status is: Inpatient  Remains inpatient appropriate because:Inpatient level of care appropriate due to severity of illness  Dispo: The patient is from: Home              Anticipated d/c is to: Home              Patient currently is not medically stable to d/c.   Difficult to place patient No  Still on PCA pump   Consultants: PCCM, IR, cardiothoracic surgery  Procedures: Bronchoscopy  Antimicrobials:  Anti-infectives (From admission, onward)    Start     Dose/Rate Route Frequency Ordered Stop   03/19/21 0900  vancomycin (VANCOREADY) IVPB 1250 mg/250 mL  Status:  Discontinued        1,250 mg 166.7 mL/hr over 90 Minutes Intravenous Every 12 hours 03/18/21 1842 03/20/21 1059   03/18/21 2200  ceFEPIme (MAXIPIME) 2 g in sodium chloride 0.9 % 100 mL IVPB        2 g 200 mL/hr over 30 Minutes Intravenous Every 8 hours 03/18/21 1836 03/25/21 2159   03/18/21 1930  vancomycin (VANCOREADY) IVPB 1500 mg/300 mL        1,500 mg 150 mL/hr over 120 Minutes Intravenous  Once 03/18/21 1836 03/19/21 0734   03/18/21 1915  azithromycin (ZITHROMAX) 500 mg in sodium chloride 0.9 % 250 mL IVPB  Status:  Discontinued        500 mg 250 mL/hr over 60 Minutes Intravenous Every 24 hours 03/18/21 1822 03/19/21 0840   03/18/21 1800  penicillin v potassium (VEETID) tablet 500 mg  Status:  Discontinued       Note to Pharmacy: Start date : 03/14/21     500 mg Oral 4 times  daily 03/18/21 1527 03/18/21 1842       Subjective:  Patient seen and examined the bedside this morning.  Hemodynamically stable during my evaluation.  He says his pain is well controlled this morning but had severe pain last night and did not get Dilaudid pump as ordered because of nursing issues.  He is hemoptysis has been better and he is not coughing like before.  I had a long discussion with wife and palliative care today, we decided to transfer him to [facility] because of ongoing radiation treatment and he does need to travel back and forth   Objective: Vitals:   03/24/21 0402 03/24/21 0754 03/24/21 0847 03/24/21 0956  BP: 130/71  (!) 137/99   Pulse: 92  (!) 108   Resp: 15 18 18 16   Temp: 98.6 F (37 C)  98.5 F (36.9 C)   TempSrc: Axillary  Oral   SpO2: 100% 100% 100% 100%  Weight:      Height:        Intake/Output Summary (Last 24 hours) at 03/24/2021 1106 Last  data filed at 03/24/2021 0658 Gross per 24 hour  Intake 285.07 ml  Output 2050 ml  Net -1764.93 ml   Filed Weights   03/18/21 0240 03/18/21 0242  Weight: 78.9 kg 79.4 kg    Examination:  General exam: Overall comfortable, not in distress HEENT: PERRL Respiratory system: Bilateral coarse breathing sound, no wheezes or crackles  Cardiovascular system: S1 & S2 heard, RRR.  Gastrointestinal system: Abdomen is nondistended, soft and nontender. Central nervous system: Alert and oriented Extremities: No edema, no clubbing ,no cyanosis Skin: No rashes, no ulcers,no icterus    Data Reviewed: I have personally reviewed following labs and imaging studies  CBC: Recent Labs  Lab 03/20/21 0407 03/21/21 0057 03/22/21 0212 03/23/21 0532 03/24/21 0224  WBC 20.2* 18.9* 17.2* 23.3* 23.7*  NEUTROABS 18.1* 15.1* 13.0* 20.3* 20.7*  HGB 8.8* 8.5* 8.5* 8.6* 8.1*  HCT 26.8* 26.9* 25.9* 27.1* 25.2*  MCV 92.4 94.1 93.2 94.8 93.3  PLT 304 372 361 377 710*   Basic Metabolic Panel: Recent Labs  Lab 03/18/21 0246  03/19/21 0215 03/20/21 0407 03/23/21 0532 03/24/21 0224  NA 134* 130* 134* 134*  --   K 4.0 4.2 3.8 4.1  --   CL 98 95* 99 98  --   CO2 26 27 24 28   --   GLUCOSE 172* 158* 211* 171*  --   BUN 13 8 9 10   --   CREATININE 0.50* 0.51* 0.68 0.52*  --   CALCIUM 9.0 8.5* 9.1 9.2  --   MG  --   --   --   --  2.1  PHOS  --   --   --   --  3.2   GFR: Estimated Creatinine Clearance: 103.9 mL/min (A) (by C-G formula based on SCr of 0.52 mg/dL (L)). Liver Function Tests: No results for input(s): AST, ALT, ALKPHOS, BILITOT, PROT, ALBUMIN in the last 168 hours.  No results for input(s): LIPASE, AMYLASE in the last 168 hours. No results for input(s): AMMONIA in the last 168 hours. Coagulation Profile: Recent Labs  Lab 03/18/21 0246 03/18/21 1609 03/19/21 0215  INR 1.0 1.0 1.1   Cardiac Enzymes: No results for input(s): CKTOTAL, CKMB, CKMBINDEX, TROPONINI in the last 168 hours. BNP (last 3 results) No results for input(s): PROBNP in the last 8760 hours. HbA1C: Recent Labs    03/23/21 0532  HGBA1C 6.5*   CBG: Recent Labs  Lab 03/23/21 0306 03/23/21 1202 03/23/21 1539 03/23/21 2027 03/24/21 0759  GLUCAP 173* 191* 233* 246* 136*   Lipid Profile: No results for input(s): CHOL, HDL, LDLCALC, TRIG, CHOLHDL, LDLDIRECT in the last 72 hours. Thyroid Function Tests: No results for input(s): TSH, T4TOTAL, FREET4, T3FREE, THYROIDAB in the last 72 hours. Anemia Panel: No results for input(s): VITAMINB12, FOLATE, FERRITIN, TIBC, IRON, RETICCTPCT in the last 72 hours.  Sepsis Labs: Recent Labs  Lab 03/18/21 1551 03/19/21 0215 03/20/21 0407  PROCALCITON <0.10 0.12 <0.10    Recent Results (from the past 240 hour(s))  Resp Panel by RT-PCR (Flu A&B, Covid) Nasopharyngeal Swab     Status: None   Collection Time: 03/18/21  2:57 AM   Specimen: Nasopharyngeal Swab; Nasopharyngeal(NP) swabs in vial transport medium  Result Value Ref Range Status   SARS Coronavirus 2 by RT PCR NEGATIVE  NEGATIVE Final    Comment: (NOTE) SARS-CoV-2 target nucleic acids are NOT DETECTED.  The SARS-CoV-2 RNA is generally detectable in upper respiratory specimens during the acute phase of infection. The lowest concentration of  SARS-CoV-2 viral copies this assay can detect is 138 copies/mL. A negative result does not preclude SARS-Cov-2 infection and should not be used as the sole basis for treatment or other patient management decisions. A negative result may occur with  improper specimen collection/handling, submission of specimen other than nasopharyngeal swab, presence of viral mutation(s) within the areas targeted by this assay, and inadequate number of viral copies(<138 copies/mL). A negative result must be combined with clinical observations, patient history, and epidemiological information. The expected result is Negative.  Fact Sheet for Patients:  EntrepreneurPulse.com.au  Fact Sheet for Healthcare Providers:  IncredibleEmployment.be  This test is no t yet approved or cleared by the [place] FDA and  has been authorized for detection and/or diagnosis of SARS-CoV-2 by FDA under an Emergency Use Authorization (EUA). This EUA will remain  in effect (meaning this test can be used) for the duration of the COVID-19 declaration under Section 564(b)(1) of the Act, 21 U.S.C.section 360bbb-3(b)(1), unless the authorization is terminated  or revoked sooner.       Influenza A by PCR NEGATIVE NEGATIVE Final   Influenza B by PCR NEGATIVE NEGATIVE Final    Comment: (NOTE) The Xpert Xpress SARS-CoV-2/FLU/RSV plus assay is intended as an aid in the diagnosis of influenza from Nasopharyngeal swab specimens and should not be used as a sole basis for treatment. Nasal washings and aspirates are unacceptable for Xpert Xpress SARS-CoV-2/FLU/RSV testing.  Fact Sheet for Patients: EntrepreneurPulse.com.au  Fact Sheet for Healthcare  Providers: IncredibleEmployment.be  This test is not yet approved or cleared by the [place] FDA and has been authorized for detection and/or diagnosis of SARS-CoV-2 by FDA under an Emergency Use Authorization (EUA). This EUA will remain in effect (meaning this test can be used) for the duration of the COVID-19 declaration under Section 564(b)(1) of the Act, 21 U.S.C. section 360bbb-3(b)(1), unless the authorization is terminated or revoked.  Performed at [facility], [address] [place], [place] [address]   MRSA Next Gen by PCR, Nasal     Status: None   Collection Time: 03/19/21  8:41 AM   Specimen: Nasal Mucosa; Nasal Swab  Result Value Ref Range Status   MRSA by PCR Next Gen NOT DETECTED NOT DETECTED Final    Comment: (NOTE) The GeneXpert MRSA Assay (FDA approved for NASAL specimens only), is one component of a comprehensive MRSA colonization surveillance program. It is not intended to diagnose MRSA infection nor to guide or monitor treatment for MRSA infections. Test performance is not FDA approved in patients less than 63 years old. Performed at [facility], [place] [address] [place], [place] [address]   Expectorated Sputum Assessment w Gram Stain, Rflx to Resp Cult     Status: None   Collection Time: 03/20/21  9:24 PM   Specimen: Sputum  Result Value Ref Range Status   Specimen Description SPUTUM  Final   Special Requests NONE  Final   Sputum evaluation   Final    THIS SPECIMEN IS ACCEPTABLE FOR SPUTUM CULTURE Performed at [facility], [place] [address] [place], [place] [address]    Report Status 03/21/2021 FINAL  Final  Culture, Respiratory w Gram Stain     Status: None   Collection Time: 03/20/21  9:24 PM   Specimen: SPU  Result Value Ref Range Status   Specimen Description SPUTUM  Final   Special Requests NONE Reflexed from [id number]  Final   Gram Stain   Final  FEW WBC PRESENT,BOTH PMN AND  MONONUCLEAR FEW GRAM POSITIVE COCCI RARE GRAM NEGATIVE RODS    Culture   Final    RARE Normal respiratory flora-no Staph aureus or Pseudomonas seen Performed at [facility], [address] [place], [place] [id number]    Report Status 03/24/2021 FINAL  Final         Radiology Studies: DG Chest Port 1 View  Result Date: 03/24/2021 CLINICAL DATA:  Respiration abnormal EXAM: PORTABLE CHEST 1 VIEW COMPARISON:  Radiograph 03/18/2021, CT 03/18/2021 FINDINGS: Large RIGHT upper lobe apical mass again demonstrated. Stable cardiac silhouette. Mild central venous congestion is slightly increased from prior. No pneumothorax. No pleural fluid. IMPRESSION: 1. Mild increase in venous pulmonary congestion. 2. Stable RIGHT apical mass. Electronically Signed   By: Suzy Bouchard M.D.   On: 03/24/2021 09:37        Scheduled Meds:  albuterol  2.5 mg Nebulization Q6H   amLODipine  10 mg Oral Daily   chlorpheniramine-HYDROcodone  5 mL Oral Q12H   feeding supplement  237 mL Oral BID BM   ferrous sulfate  325 mg Oral Q breakfast   gabapentin  300 mg Oral BID WC   gabapentin  600 mg Oral QHS   guaiFENesin  600 mg Oral BID   HYDROmorphone   Intravenous Q4H   insulin aspart  0-9 Units Subcutaneous TID WC   irbesartan  300 mg Oral Daily   latanoprost  1 drop Both Eyes QHS   lidocaine  1 patch Transdermal Q24H   methylPREDNISolone (SOLU-MEDROL) injection  40 mg Intravenous Q24H   morphine  200 mg Oral Q12H   multivitamin with minerals  1 tablet Oral Daily   pantoprazole  40 mg Oral Daily   QUEtiapine  100 mg Oral QHS   sertraline  100 mg Oral Daily   sodium chloride flush  3 mL Intravenous Q12H   Continuous Infusions:  ceFEPime (MAXIPIME) IV 2 g (03/24/21 0541)   tranexamic acid       LOS: 5 days    Time spent:25 mins. More than 50% of that time was spent in counseling and/or coordination of care.      Shelly Coss, MD [facility] P7/16/2022, 11:06 AM

## 2021-03-24 NOTE — Progress Notes (Signed)
Pt remains on PCA pump dilaudid 1mg /mL /hr 0.3mg  on demand up to 80min. Max dose is 5mg . Total Shift dose was 3.9 mg. 13 demands. 13 received dose, with an additional Delaudid PRN injection 1mg  x2.

## 2021-03-25 DIAGNOSIS — R52 Pain, unspecified: Secondary | ICD-10-CM | POA: Diagnosis not present

## 2021-03-25 DIAGNOSIS — C3411 Malignant neoplasm of upper lobe, right bronchus or lung: Secondary | ICD-10-CM | POA: Diagnosis not present

## 2021-03-25 DIAGNOSIS — Z515 Encounter for palliative care: Secondary | ICD-10-CM | POA: Diagnosis not present

## 2021-03-25 DIAGNOSIS — R042 Hemoptysis: Secondary | ICD-10-CM | POA: Diagnosis not present

## 2021-03-25 DIAGNOSIS — R0489 Hemorrhage from other sites in respiratory passages: Secondary | ICD-10-CM | POA: Diagnosis not present

## 2021-03-25 LAB — CBC WITH DIFFERENTIAL/PLATELET
Abs Immature Granulocytes: 0.4 10*3/uL — ABNORMAL HIGH (ref 0.00–0.07)
Basophils Absolute: 0 10*3/uL (ref 0.0–0.1)
Basophils Relative: 0 %
Eosinophils Absolute: 0.8 10*3/uL — ABNORMAL HIGH (ref 0.0–0.5)
Eosinophils Relative: 4 %
HCT: 25.9 % — ABNORMAL LOW (ref 39.0–52.0)
Hemoglobin: 8.3 g/dL — ABNORMAL LOW (ref 13.0–17.0)
Immature Granulocytes: 2 %
Lymphocytes Relative: 3 %
Lymphs Abs: 0.6 10*3/uL — ABNORMAL LOW (ref 0.7–4.0)
MCH: 30.1 pg (ref 26.0–34.0)
MCHC: 32 g/dL (ref 30.0–36.0)
MCV: 93.8 fL (ref 80.0–100.0)
Monocytes Absolute: 1.6 10*3/uL — ABNORMAL HIGH (ref 0.1–1.0)
Monocytes Relative: 8 %
Neutro Abs: 18 10*3/uL — ABNORMAL HIGH (ref 1.7–7.7)
Neutrophils Relative %: 83 %
Platelets: 416 10*3/uL — ABNORMAL HIGH (ref 150–400)
RBC: 2.76 MIL/uL — ABNORMAL LOW (ref 4.22–5.81)
RDW: 15.4 % (ref 11.5–15.5)
WBC: 21.5 10*3/uL — ABNORMAL HIGH (ref 4.0–10.5)
nRBC: 0 % (ref 0.0–0.2)

## 2021-03-25 LAB — GLUCOSE, CAPILLARY
Glucose-Capillary: 140 mg/dL — ABNORMAL HIGH (ref 70–99)
Glucose-Capillary: 162 mg/dL — ABNORMAL HIGH (ref 70–99)
Glucose-Capillary: 262 mg/dL — ABNORMAL HIGH (ref 70–99)
Glucose-Capillary: 289 mg/dL — ABNORMAL HIGH (ref 70–99)

## 2021-03-25 MED ORDER — HYDROMORPHONE 1 MG/ML IV SOLN: INTRAVENOUS | Status: DC

## 2021-03-25 MED ORDER — DIAZEPAM 2 MG PO TABS
1.0000 mg | ORAL_TABLET | Freq: Four times a day (QID) | ORAL | Status: DC
  Administered 2021-03-25 – 2021-03-26 (×5): 1 mg via ORAL
  Filled 2021-03-25 (×5): qty 1

## 2021-03-25 NOTE — Progress Notes (Signed)
PROGRESS NOTE    Christopher Carter  ZYS:[id number] DOB: 19-Feb-1961 DOA: 03/18/2021 PCP: [name], MD   Chief Complain: Hemoptysis  Brief Narrative: Patient is a 60 year old male with history of adenocarcinoma of the lung status post right upper lobe lobectomy, chest wall dissection by Dr. Koleen Nimrod on 5/16, hypertension, anxiety, depression who presented with hemoptysis.  He was admitted recently from 6/19-6/29 for hemoptysis, shortness of breath.  At that time he was found to have loculated hemothorax requiring IR placement of a chest tube and empiric antibiotics with prednisone taper.  Chest tube was removed prior to discharge.  He has following with cardiothoracic surgery and radiation oncology.  Patient again developed persistent cough and coughed of blood more frequently than before.  He was also on oxygen at 4 L/min at home which was bumped to 6 L.  Patient was then brought to the emergency department.  On presentation he was hemodynamically stable.  Lab work showed leukocytosis, hemoglobin of 10.8.  CT chest showed airspace disease in the right lung representing alveolar hemorrhage with recurrence of apical fluid collection, nodular masslike lesion concerning for recurrence of malignancy, right peritracheal adenopathy.  PCCM, cardiothoracic surgery consulted.  Radiation oncology started on radiation treatment for lung cancer.  Currently hemoptysis has been improving but due to severe pain, he is on PCA pump, palliative care following for this.  Due to  ongoing radiation treatment, he will be transferred to [facility].  Assessment & Plan:   Principal Problem:   Intra-alveolar hemorrhage Active Problems:   Essential hypertension   Acute on chronic respiratory failure with hypoxia (HCC)   Malignant neoplasm of right upper lobe of lung (HCC)   Major depressive disorder   Uncontrolled pain   Leukocytosis   Normocytic anemia   Tooth abscess   Thrombocytosis   Palliative care  by specialist   Palliative care encounter   Intra-alveolar hemorrhage: Has history of recurrent hemoptysis, recently admitted in June for the same, at that time chest tube was placed and was removed on discharge.  He was following with cardiothoracic surgery and radiation oncology. Presented with recurrent hemoptysis, increment  in the oxygen requirement.  Thought to be due to recurrence of malignancy/underlying infection.  IR consulted for chest tube placement but the plan is currently on hold..  Cardiothoracic surgery, PCCM were also consulted. He underwent bronchoscopy  by PCCM without finding of any active bleeding.  PCCM started on  IV steroid for persistent hemoptysis.  Hemoptysis has so far improved.He is also on tranexamic acid nebulization. Will request PCCM to continue following at [facility].  I have sent message to Dr. 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 Koleen Nimrod on 5/16 Radiation oncology started radiation treatment, so far 3 cycles of treatment done. He needs eventually follow-up with oncology as an outpatient, Dr. Julien Nordmann  Chronic respiratory failure with hypoxia: On 4 L of oxygen per minute at home.Now at around baseline again  Uncontrolled pain: Currently on Dilaudid pump.  Palliative care consulted and following for pump.  He will be taken care of by Dr. Domingo Cocking at [facility]  Leukocytosis:   Most likely multifactorial causes including malignancy.  On reviewing his lab work, he has chronic leukocytosis.  Tooth abscess: He was on on penicillin V  that was started as an outpatient.  Plan for root canal treatment on 7/11, it has been  postponed.  Normocytic anemia: Hemoglobin has dropped to the range of 8.  Was 11.5 on 7/7.   Could be associated with hemoptysis/alveolar hge.  We will continue to monitor.  Iron studies showed severe iron deficiency.  Given 1 dose of IV iron, started on oral supplementation.  Hypertension: Currently blood pressure stable.  Continue home amlodipine, irbesartan.  History of anxiety/depression: On Seroquel, Zoloft  GERD:On  Protonix    Nutrition Problem: Increased nutrient needs Etiology: cancer and cancer related treatments      DVT prophylaxis:SCD Code Status: Full Family Communication:: Discussed with wife at bedside today Status is: Inpatient  Remains inpatient appropriate because:Inpatient level of care appropriate due to severity of illness  Dispo: The patient is from: Home              Anticipated d/c is to: Home              Patient currently is not medically stable to d/c.   Difficult to place patient No  Still on PCA pump   Consultants: PCCM, IR, cardiothoracic surgery  Procedures: Bronchoscopy  Antimicrobials:  Anti-infectives (From admission, onward)    Start     Dose/Rate Route Frequency Ordered Stop   03/19/21 0900  vancomycin (VANCOREADY) IVPB 1250 mg/250 mL  Status:  Discontinued        1,250 mg 166.7 mL/hr over 90 Minutes Intravenous Every 12 hours 03/18/21 1842 03/20/21 1059   03/18/21 2200  ceFEPIme (MAXIPIME) 2 g in sodium chloride 0.9 % 100 mL IVPB        2 g 200 mL/hr over 30 Minutes Intravenous Every 8 hours 03/18/21 1836 03/25/21 2159   03/18/21 1930  vancomycin (VANCOREADY) IVPB 1500 mg/300 mL        1,500 mg 150 mL/hr over 120 Minutes Intravenous  Once 03/18/21 1836 03/19/21 0734   03/18/21 1915  azithromycin (ZITHROMAX) 500 mg in sodium chloride 0.9 % 250 mL IVPB  Status:  Discontinued        500 mg 250 mL/hr over 60 Minutes Intravenous Every 24 hours 03/18/21 1822 03/19/21 0840   03/18/21 1800  penicillin v potassium (VEETID) tablet 500 mg  Status:  Discontinued       Note to Pharmacy: Start date : 03/14/21     500 mg Oral 4  times daily 03/18/21 1527 03/18/21 1842       Subjective:  Patient seen and examined at the bedside this morning.  Wife at bedside.  On baseline oxygen requirement, 4 L.  Denies any worsening shortness of breath or cough.  He has not noticed blood in the sputum since last 1 to 2 days.  Patient and family anxious about getting into [facility].  Objective: Vitals:   03/25/21 0420 03/25/21 0747 03/25/21 0812 03/25/21 0816  BP:  125/71  136/71  Pulse:  90  98  Resp: 11 10 11 12   Temp:  97.9 F (36.6 C)  98.3 F (36.8 C)  TempSrc:  Oral  Oral  SpO2: 100% 100% 100% 99%  Weight:      Height:        Intake/Output Summary (Last 24 hours) at 03/25/2021 0823 Last data filed at 03/25/2021 0748 Gross per 24 hour  Intake 390.54 ml  Output 1550 ml  Net -1159.46 ml   Filed Weights   03/18/21 0240 03/18/21 0242  Weight: 78.9 kg 79.4 kg  Examination:  General exam: Overall comfortable, not in distress HEENT: PERRL Respiratory system: Bilateral diminished air sounds but no no wheezes or crackles  Cardiovascular system: S1 & S2 heard, RRR.  Gastrointestinal system: Abdomen is nondistended, soft and nontender. Central nervous system: Alert and oriented Extremities: No edema, no clubbing ,no cyanosis Skin: No rashes, no ulcers,no icterus    Data Reviewed: I have personally reviewed following labs and imaging studies  CBC: Recent Labs  Lab 03/21/21 0057 03/22/21 0212 03/23/21 0532 03/24/21 0224 03/25/21 0357  WBC 18.9* 17.2* 23.3* 23.7* 21.5*  NEUTROABS 15.1* 13.0* 20.3* 20.7* 18.0*  HGB 8.5* 8.5* 8.6* 8.1* 8.3*  HCT 26.9* 25.9* 27.1* 25.2* 25.9*  MCV 94.1 93.2 94.8 93.3 93.8  PLT 372 361 377 412* 546*   Basic Metabolic Panel: Recent Labs  Lab 03/19/21 0215 03/20/21 0407 03/23/21 0532 03/24/21 0224  NA 130* 134* 134*  --   K 4.2 3.8 4.1  --   CL 95* 99 98  --   CO2 27 24 28   --   GLUCOSE 158* 211* 171*  --   BUN 8 9 10   --   CREATININE 0.51* 0.68 0.52*  --   CALCIUM  8.5* 9.1 9.2  --   MG  --   --   --  2.1  PHOS  --   --   --  3.2   GFR: Estimated Creatinine Clearance: 103.9 mL/min (A) (by C-G formula based on SCr of 0.52 mg/dL (L)). Liver Function Tests: No results for input(s): AST, ALT, ALKPHOS, BILITOT, PROT, ALBUMIN in the last 168 hours.  No results for input(s): LIPASE, AMYLASE in the last 168 hours. No results for input(s): AMMONIA in the last 168 hours. Coagulation Profile: Recent Labs  Lab 03/18/21 1609 03/19/21 0215  INR 1.0 1.1   Cardiac Enzymes: No results for input(s): CKTOTAL, CKMB, CKMBINDEX, TROPONINI in the last 168 hours. BNP (last 3 results) No results for input(s): PROBNP in the last 8760 hours. HbA1C: Recent Labs    03/23/21 0532  HGBA1C 6.5*   CBG: Recent Labs  Lab 03/23/21 2027 03/24/21 0759 03/24/21 1150 03/24/21 1514 03/25/21 0745  GLUCAP 246* 136* 166* 196* 140*   Lipid Profile: No results for input(s): CHOL, HDL, LDLCALC, TRIG, CHOLHDL, LDLDIRECT in the last 72 hours. Thyroid Function Tests: No results for input(s): TSH, T4TOTAL, FREET4, T3FREE, THYROIDAB in the last 72 hours. Anemia Panel: No results for input(s): VITAMINB12, FOLATE, FERRITIN, TIBC, IRON, RETICCTPCT in the last 72 hours.  Sepsis Labs: Recent Labs  Lab 03/18/21 1551 03/19/21 0215 03/20/21 0407  PROCALCITON <0.10 0.12 <0.10    Recent Results (from the past 240 hour(s))  Resp Panel by RT-PCR (Flu A&B, Covid) Nasopharyngeal Swab     Status: None   Collection Time: 03/18/21  2:57 AM   Specimen: Nasopharyngeal Swab; Nasopharyngeal(NP) swabs in vial transport medium  Result Value Ref Range Status   SARS Coronavirus 2 by RT PCR NEGATIVE NEGATIVE Final    Comment: (NOTE) SARS-CoV-2 target nucleic acids are NOT DETECTED.  The SARS-CoV-2 RNA is generally detectable in upper respiratory specimens during the acute phase of infection. The lowest concentration of SARS-CoV-2 viral copies this assay can detect is 138 copies/mL. A  negative result does not preclude SARS-Cov-2 infection and should not be used as the sole basis for treatment or other patient management decisions. A negative result may occur with  improper specimen collection/handling, submission of specimen other than nasopharyngeal swab, presence of viral mutation(s) within the  areas targeted by this assay, and inadequate number of viral copies(<138 copies/mL). A negative result must be combined with clinical observations, patient history, and epidemiological information. The expected result is Negative.  Fact Sheet for Patients:  EntrepreneurPulse.com.au  Fact Sheet for Healthcare Providers:  IncredibleEmployment.be  This test is no t yet approved or cleared by the [place] FDA and  has been authorized for detection and/or diagnosis of SARS-CoV-2 by FDA under an Emergency Use Authorization (EUA). This EUA will remain  in effect (meaning this test can be used) for the duration of the COVID-19 declaration under Section 564(b)(1) of the Act, 21 U.S.C.section 360bbb-3(b)(1), unless the authorization is terminated  or revoked sooner.       Influenza A by PCR NEGATIVE NEGATIVE Final   Influenza B by PCR NEGATIVE NEGATIVE Final    Comment: (NOTE) The Xpert Xpress SARS-CoV-2/FLU/RSV plus assay is intended as an aid in the diagnosis of influenza from Nasopharyngeal swab specimens and should not be used as a sole basis for treatment. Nasal washings and aspirates are unacceptable for Xpert Xpress SARS-CoV-2/FLU/RSV testing.  Fact Sheet for Patients: EntrepreneurPulse.com.au  Fact Sheet for Healthcare Providers: IncredibleEmployment.be  This test is not yet approved or cleared by the [place] FDA and has been authorized for detection and/or diagnosis of SARS-CoV-2 by FDA under an Emergency Use Authorization (EUA). This EUA will remain in effect (meaning this test can  be used) for the duration of the COVID-19 declaration under Section 564(b)(1) of the Act, 21 U.S.C. section 360bbb-3(b)(1), unless the authorization is terminated or revoked.  Performed at [facility], [address] [place], [place] [address]   MRSA Next Gen by PCR, Nasal     Status: None   Collection Time: 03/19/21  8:41 AM   Specimen: Nasal Mucosa; Nasal Swab  Result Value Ref Range Status   MRSA by PCR Next Gen NOT DETECTED NOT DETECTED Final    Comment: (NOTE) The GeneXpert MRSA Assay (FDA approved for NASAL specimens only), is one component of a comprehensive MRSA colonization surveillance program. It is not intended to diagnose MRSA infection nor to guide or monitor treatment for MRSA infections. Test performance is not FDA approved in patients less than 65 years old. Performed at [facility], [place] [address], [place], [place] [address]   Expectorated Sputum Assessment w Gram Stain, Rflx to Resp Cult     Status: None   Collection Time: 03/20/21  9:24 PM   Specimen: Sputum  Result Value Ref Range Status   Specimen Description SPUTUM  Final   Special Requests NONE  Final   Sputum evaluation   Final    THIS SPECIMEN IS ACCEPTABLE FOR SPUTUM CULTURE Performed at [facility], [place] [address], [place], [place] [address]    Report Status 03/21/2021 FINAL  Final  Culture, Respiratory w Gram Stain     Status: None   Collection Time: 03/20/21  9:24 PM   Specimen: SPU  Result Value Ref Range Status   Specimen Description SPUTUM  Final   Special Requests NONE Reflexed from [id number]  Final   Gram Stain   Final    FEW WBC PRESENT,BOTH PMN AND MONONUCLEAR FEW GRAM POSITIVE COCCI RARE GRAM NEGATIVE RODS    Culture   Final    RARE Normal respiratory flora-no Staph aureus or Pseudomonas seen Performed at [facility], [place] [address], [place], [place] [address]    Report Status 03/24/2021 FINAL  Final  Radiology Studies: DG  Chest Port 1 View  Result Date: 03/24/2021 CLINICAL DATA:  Respiration abnormal EXAM: PORTABLE CHEST 1 VIEW COMPARISON:  Radiograph 03/18/2021, CT 03/18/2021 FINDINGS: Large RIGHT upper lobe apical mass again demonstrated. Stable cardiac silhouette. Mild central venous congestion is slightly increased from prior. No pneumothorax. No pleural fluid. IMPRESSION: 1. Mild increase in venous pulmonary congestion. 2. Stable RIGHT apical mass. Electronically Signed   By: Suzy Bouchard M.D.   On: 03/24/2021 09:37        Scheduled Meds:  albuterol  2.5 mg Nebulization Q6H   amLODipine  10 mg Oral Daily   chlorpheniramine-HYDROcodone  5 mL Oral Q12H   feeding supplement  237 mL Oral BID BM   ferrous sulfate  325 mg Oral Q breakfast   gabapentin  300 mg Oral BID WC   gabapentin  600 mg Oral QHS   guaiFENesin  600 mg Oral BID   HYDROmorphone   Intravenous Q4H   insulin aspart  0-9 Units Subcutaneous TID WC   irbesartan  300 mg Oral Daily   latanoprost  1 drop Both Eyes QHS   lidocaine  1 patch Transdermal Q24H   methylPREDNISolone (SOLU-MEDROL) injection  40 mg Intravenous Q24H   multivitamin with minerals  1 tablet Oral Daily   pantoprazole  40 mg Oral Daily   QUEtiapine  100 mg Oral QHS   sertraline  100 mg Oral Daily   sodium chloride flush  3 mL Intravenous Q12H   Continuous Infusions:  ceFEPime (MAXIPIME) IV 2 g (03/25/21 0454)   tranexamic acid       LOS: 6 days    Time spent:25 mins. More than 50% of that time was spent in counseling and/or coordination of care.      Shelly Coss, MD [facility] P7/17/2022, 8:23 AM

## 2021-03-25 NOTE — Progress Notes (Addendum)
Gave report to [facility] for pt transfer. [facility] has been called

## 2021-03-25 NOTE — Progress Notes (Signed)
New Syringe of delaudid PCA was loaded at 0600. Wasted 25cc witnessed by [place].

## 2021-03-25 NOTE — Progress Notes (Signed)
Daily Progress Note   Patient Name: Christopher Carter       Date: 03/25/2021 DOB: [birth year]  Age: 60 y.o. MRN#: [id number] Attending Physician: Shelly Coss, MD Primary Care Physician: [name], MD Admit Date: 03/18/2021  Reason for Consultation/Follow-up: symptom management To discuss complex medical decision making related to patient's goals of care  Subjective: At 8:20 am patient denies pain and appears comfortable.  He tells me he is still having some myoclonic jerking particularly in his feet.  Spoke with [name] at approximately 10:20.  She feels Christopher Carter is having more pain - hitting the PCA button and then needing to hit it again before 8 min is up.  She mentions that his right hand and legs have a resting tremor that has been going on for some days.  [name] does not seem to be bothered by it.   Assessment: On 7/17 24 hour intake of dilaudid is 32.26.  his demands are 32 and he had 25 doses delivered. 12 hour intake of dilaudid PCA is 19.27 mg.  Demands are 24 and 18 doses delivered.  He has had no additional PRNs.  Patient Profile/HPI:  60 y.o. 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 Koleen Nimrod on 5/16, hypertension, anxiety, and depression admitted on 03/18/2021 with hemoptysis, shortness of breath, and uncontrolled cancer-related pain.   Palliative medicine has been consulted to assist with pain management and goals of care conversation.      Length of Stay: 6   Vital Signs: BP 136/71 (BP Location: Right Arm)   Pulse 98   Temp 98.3 F (36.8 C) (Oral)   Resp 12   Ht 5\' 10"  (1.778 m)   Wt 79.4 kg   SpO2 99%   BMI 25.11 kg/m  SpO2: SpO2: 99 % O2 Device: O2 Device: Nasal  Cannula O2 Flow Rate: O2 Flow Rate (L/min): 4 L/min       Palliative Assessment/Data: 40%     Palliative Care Plan    Recommendations/Plan: Ambulate as tolerated. Obtain an EKG to assess QTC Will schedule valium 1 mg q 6 hours to assist with reducing a component of anxiety that is likely contributing to pain. Will likely move toward fentanyl patch vs methadone for outpatient regimen to  be discussed with Dr. Hilma Favors tomorrow. Will re-round later today to ensure comfort and discuss patient's condition with [name].  Code Status:  Full code  Prognosis:  Unable to determine   Discharge Planning: Home with [place] was discussed with patient, wife, PMT attending.  Thank you for allowing the Palliative Medicine Team to assist in the care of this patient.  Total time spent:  35 min.     Greater than 50%  of this time was spent counseling and coordinating care related to the above assessment and plan.  Florentina Jenny, PA-C Palliative Medicine  Please contact Palliative MedicineTeam phone at [phone] for questions and concerns between 7 am - 7 pm.   Please see AMION for individual provider pager numbers.

## 2021-03-25 NOTE — Plan of Care (Signed)
  Problem: Activity: Goal: Risk for activity intolerance will decrease Outcome: Progressing   Problem: Nutrition: Goal: Adequate nutrition will be maintained Outcome: Progressing   Problem: Coping: Goal: Level of anxiety will decrease Outcome: Progressing   Problem: Pain Managment: Goal: General experience of comfort will improve Outcome: Progressing   Problem: Safety: Goal: Ability to remain free from injury will improve Outcome: Progressing   

## 2021-03-26 ENCOUNTER — Ambulatory Visit
Admit: 2021-03-26 | Discharge: 2021-03-26 | Disposition: A | Discharge status: Home / Self Care | Payer: 59 | Attending: Radiation Oncology | Admitting: Radiation Oncology

## 2021-03-26 DIAGNOSIS — I10 Essential (primary) hypertension: Secondary | ICD-10-CM | POA: Diagnosis not present

## 2021-03-26 DIAGNOSIS — F32A Depression, unspecified: Secondary | ICD-10-CM

## 2021-03-26 DIAGNOSIS — G893 Neoplasm related pain (acute) (chronic): Secondary | ICD-10-CM

## 2021-03-26 DIAGNOSIS — F419 Anxiety disorder, unspecified: Secondary | ICD-10-CM

## 2021-03-26 DIAGNOSIS — R0489 Hemorrhage from other sites in respiratory passages: Secondary | ICD-10-CM | POA: Diagnosis not present

## 2021-03-26 DIAGNOSIS — D72829 Elevated white blood cell count, unspecified: Secondary | ICD-10-CM | POA: Diagnosis not present

## 2021-03-26 DIAGNOSIS — J9621 Acute and chronic respiratory failure with hypoxia: Secondary | ICD-10-CM | POA: Diagnosis not present

## 2021-03-26 LAB — GLUCOSE, CAPILLARY
Glucose-Capillary: 122 mg/dL — ABNORMAL HIGH (ref 70–99)
Glucose-Capillary: 204 mg/dL — ABNORMAL HIGH (ref 70–99)
Glucose-Capillary: 254 mg/dL — ABNORMAL HIGH (ref 70–99)
Glucose-Capillary: 241 mg/dL — ABNORMAL HIGH (ref 70–99)

## 2021-03-26 MED ORDER — PREDNISONE 20 MG PO TABS
20.0000 mg | ORAL_TABLET | Freq: Two times a day (BID) | ORAL | Status: DC
  Administered 2021-03-26 – 2021-03-27 (×2): 20 mg via ORAL
  Filled 2021-03-26 (×2): qty 1

## 2021-03-26 MED ORDER — DIAZEPAM 2 MG PO TABS
2.0000 mg | ORAL_TABLET | Freq: Four times a day (QID) | ORAL | Status: DC
  Administered 2021-03-26 – 2021-03-29 (×11): 2 mg via ORAL
  Filled 2021-03-26 (×11): qty 1

## 2021-03-26 MED ORDER — FENTANYL 50 MCG/ML IV PCA SOLN
INTRAVENOUS | Status: DC
  Administered 2021-03-27: 25 ug via INTRAVENOUS
  Administered 2021-03-27: 913.3 ug via INTRAVENOUS
  Administered 2021-03-27: 598.8 ug via INTRAVENOUS
  Administered 2021-03-27: 50 ug via INTRAVENOUS
  Administered 2021-03-28: 672 ug via INTRAVENOUS
  Administered 2021-03-28: 53 ug via INTRAVENOUS
  Administered 2021-03-28: 417.3 ug via INTRAVENOUS
  Administered 2021-03-28: 435 ug via INTRAVENOUS
  Administered 2021-03-28: 341.1 ug via INTRAVENOUS
  Administered 2021-03-28: 388.6 ug via INTRAVENOUS
  Administered 2021-03-29: 356.3 ug via INTRAVENOUS
  Administered 2021-03-29: 619.6 ug via INTRAVENOUS
  Filled 2021-03-26 (×11): qty 20

## 2021-03-26 MED ORDER — CELECOXIB 200 MG PO CAPS
200.0000 mg | ORAL_CAPSULE | Freq: Two times a day (BID) | ORAL | Status: DC
  Administered 2021-03-26 – 2021-04-10 (×31): 200 mg via ORAL
  Filled 2021-03-26 (×33): qty 1

## 2021-03-26 NOTE — Progress Notes (Signed)
PCA Dilaudid waste of the amount of 3.89 mL from syringe, verified waste with Otho Darner, RN.

## 2021-03-26 NOTE — Plan of Care (Signed)
  Problem: Safety: Goal: Ability to remain free from injury will improve Outcome: Progressing   Problem: Skin Integrity: Goal: Risk for impaired skin integrity will decrease Outcome: Progressing   

## 2021-03-26 NOTE — Progress Notes (Addendum)
NAME:  Christopher Carter, MRN:  [id number], DOB:  [birth year], LOS: 7 ADMISSION DATE:  03/18/2021, CONSULTATION DATE:  03/18/2021 REFERRING MD:  Dr. Tamala Julian, CHIEF COMPLAINT:  Hemoptysis    History of Present Illness:  Christopher Carter is a 60 y.o. male with a PMH significant for Stage IV adenocarcinoma now S/P right upper lobectomy with node dissection 01/22/2021, on 5L [place] at baseline, tobacco abuse, HTN, and depression who presented to [facility] for recurrent hemoptysis. Patient has been treated at this facility for similar presentation and underwent video bronchoscopy and IR placed chest tube for pleural effusion with Dr. Roxan Hockey. He was discharged in stable condition with plans to follow up with cardiothoracic surgery and oncology.  He presented this admission with recurrent hemoptysis that started 4hr prior to admission with associated right upper back pain that radiates to upper neck. Patient was transferred from [facility] to [facility] for cardiothoracic, pulmonary, and IR consults. Vital signs stable, WBC elevated at 18.1. CT chest was obtained and consistent with alveolar hemorrhage localized to the right with right apical fluid collection.   Pertinent  Medical History  Stage IIIa adenocarcinoma now S/P right upper lobectomy with node dissection 01/22/2021, on 5L [place] at baseline, tobacco abuse, HTN, and depression   Significant Hospital Events:  7/10 admitted with recurrent hemoptysis  7/11 bronch- nonfocal source, had bloody frothy secretions in all airways, petechiae on bronchi Receiving radiation therapy  Interim History / Subjective:  Still with significant pain and discomfort  Objective   Blood pressure 115/68, pulse 93, temperature 98.6 F (37 C), temperature source Oral, resp. rate 15, height 5\' 10"  (1.778 m), weight 81.7 kg, SpO2 94 %.    FiO2 (%):  [36 %] 36 %   Intake/Output Summary (Last 24 hours) at 03/26/2021 1208 Last data filed at 03/26/2021  0700 Gross per 24 hour  Intake 340 ml  Output 1800 ml  Net -1460 ml   Filed Weights   03/18/21 0240 03/18/21 0242 03/25/21 1542  Weight: 78.9 kg 79.4 kg 81.7 kg    Examination: General: Well-nourished, does not appear to be in extreme distress Still does have some tremulousness  HEENT: MM pink/moist Neuro:  awake and interactive CV: S1-S2 appreciated PULM: Diminished in the bases 2 to 3 L 92% sats GI: soft, bsx4 active  GU: Voids Extremities: warm/dry,  edema  Skin: no rashes or lesions  Labs reviewed -Leukocytosis 21.5, hematocrit of 26, platelet count of 416  CT scan of the chest 03/18/2021 reviewed by myself showing alveolar interstitial process  Resolved Hospital Problem list     Assessment & Plan:  Acute on chronic hypoxic respiratory failure Hemoptysis Loculated right pleural effusion-s/p intervention Stage IV lung adenocarcinoma-post right upper lobectomy  -Nebulization treatments as needed -On IV steroids -Transition to oral steroids-prednisone 20 p.o. twice daily for 5 days -PCA for pain control -on neurontin for pain  Continue to optimize pain medications    Tooth abscess  Antibiotics and root canal   Deconditioning PT OT  Best Practice   Diet/type: Regular consistency (see orders) DVT prophylaxis: SCD GI prophylaxis: N/A Lines: N/A Foley:  N/A Code Status:  full code Last date of multidisciplinary goals of care discussion: Per primary   Labs   CBC: Recent Labs  Lab 03/21/21 0057 03/22/21 0212 03/23/21 0532 03/24/21 0224 03/25/21 0357  WBC 18.9* 17.2* 23.3* 23.7* 21.5*  NEUTROABS 15.1* 13.0* 20.3* 20.7* 18.0*  HGB 8.5* 8.5* 8.6* 8.1* 8.3*  HCT 26.9* 25.9* 27.1* 25.2*  25.9*  MCV 94.1 93.2 94.8 93.3 93.8  PLT 372 361 377 412* 416*    Basic Metabolic Panel: Recent Labs  Lab 03/20/21 0407 03/23/21 0532 03/24/21 0224  NA 134* 134*  --   K 3.8 4.1  --   CL 99 98  --   CO2 24 28  --   GLUCOSE 211* 171*  --   BUN 9 10  --    CREATININE 0.68 0.52*  --   CALCIUM 9.1 9.2  --   MG  --   --  2.1  PHOS  --   --  3.2   GFR: Estimated Creatinine Clearance: 103.9 mL/min (A) (by C-G formula based on SCr of 0.52 mg/dL (L)). Recent Labs  Lab 03/20/21 0407 03/21/21 0057 03/22/21 0212 03/23/21 0532 03/24/21 0224 03/25/21 0357  PROCALCITON <0.10  --   --   --   --   --   WBC 20.2*   < > 17.2* 23.3* 23.7* 21.5*   < > = values in this interval not displayed.    Sherrilyn Rist, MD [facility] Pager: See Shea Evans

## 2021-03-26 NOTE — Progress Notes (Signed)
Palliative care brief note  I checked in briefly with Christopher Carter following transfer to [facility] to ensure PCA was set up and his pain was controlled.  Reviewed PCA settings to confirm they were correct.  Micheline Rough, MD [facility] Palliative Medicine Team [phone]  NO CHARGE NOTE

## 2021-03-26 NOTE — Progress Notes (Signed)
PCA Dilaudid was transitioned to Fentanyl. Dilaudid 76ml wasted with Threasa Heads into stericycle.

## 2021-03-26 NOTE — Progress Notes (Signed)
PROGRESS NOTE  Christopher Carter FXT:[id number] DOB: Oct 13, 1960   PCP: [name], MD  Patient is from: Home  DOA: 03/18/2021 LOS: 7  Chief complaints:  Chief Complaint  Patient presents with   Hemoptysis   Back Pain     Brief Narrative / Interim history: 60 year old M with PMH of stage IV adenocarcinoma s/p RUL lobectomy with node dissection on 5/16, recurrent hemoptysis, chronic RF on 5 L, tobacco abuse, HTN, depression, loculated hemithorax treated with IR chest tube placement, empiric antibiotics and prednisone taper and discharged to follow-up with CTS and radiation oncology.  He returned with cough with hemoptysis, right upper back pain, and increased oxygen requirement.  CT chest showed airspace disease in right lung representing alveolar hemorrhage with recurrence of apical fluid collection, nodular masslike lesion concerning for recurrence of malignancy and right peritracheal adenopathy.  He was admitted to [facility].  CTS, pulmonary IR and palliative consulted.  Started on IV cefepime, systemic steroid and tranexamic acid nebulization.  He underwent bronchoscopy that did not reveal active bleeding.  Hemoptysis seems to have resolved.  Started on PCA Dilaudid by PMT for pain control, and transferred to [facility] for radiation oncology care.  Subjective: Seen and examined earlier this morning.  No major events overnight of this morning.  Pain seems to be fairly controlled with current PCA regimen.  He is also on Valium.  He denies further hemoptysis or cough.  He denies nausea or vomiting.  Family member felt his is jumpy and jittery at times.  Objective: Vitals:   03/26/21 0512 03/26/21 0737 03/26/21 0844 03/26/21 1121  BP:      Pulse:      Resp: 14 12  15   Temp:      TempSrc:      SpO2:  96% 96% 94%  Weight:      Height:        Intake/Output Summary (Last 24 hours) at 03/26/2021 1225 Last data filed at 03/26/2021 0700 Gross per 24 hour  Intake 340 ml  Output 1800  ml  Net -1460 ml   Filed Weights   03/18/21 0240 03/18/21 0242 03/25/21 1542  Weight: 78.9 kg 79.4 kg 81.7 kg    Examination:  GENERAL: No apparent distress.  Nontoxic. HEENT: MMM.  Vision and hearing grossly intact.  NECK: Supple.  No apparent JVD.  RESP:  No IWOB.  Fair aeration bilaterally. CVS:  RRR. Heart sounds normal.  ABD/GI/GU: BS+. Abd soft, NTND.  MSK/EXT:  Moves extremities. No apparent deformity. No edema.  SKIN: no apparent skin lesion or wound NEURO: Awake, alert and oriented appropriately.  No apparent focal neuro deficit. PSYCH: Calm. Normal affect.   Procedures:  7/11-bronchoscopy  Microbiology summarized: [id number] and influenza PCR nonreactive. MRSA PCR screen negative. 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 s/p RUL lobectomy and node dissection by Dr. Koleen Nimrod on 5/16 Acute on chronic respiratory failure with hypoxia-on 4 L at baseline. -CT chest showed airspace disease in right lung representing alveolar hemorrhage with recurrence of apical fluid collection, nodular masslike lesion concerning for recurrence of malignancy and right peritracheal LAD -Respiratory culture with normal respiratory flora -Underwent bronchoscopy by PCCM-no active hemorrhage -No further hemoptysis -Completed 7 days of IV cefepime empirically -Radiation oncology started radiation on 7/13>>> -Palliative care monitoring pain with PCA and as needed meds -Appreciate help by PCCM-weaning  Solu-Medrol to p.o. prednisone -Added oncology to treatment team    Uncontrolled cancer-related pain:  -Currently on Dilaudid PCA, as needed Dilaudid and fentanyl per palliative medicine  -On Valium for anxiety.   Leukocytosis: Likely demargination from steroid. -Continue monitoring   Tooth abscess: Was on penicillin V  outpatient.  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 Recent Labs    03/18/21 0246 03/18/21 1551 03/18/21 2208 03/19/21 0215 03/20/21 0407 03/21/21 0057 03/22/21 0212 03/23/21 0532 03/24/21 0224 03/25/21 0357  HGB 10.8* 9.3* 9.8* 9.4* 8.8* 8.5* 8.5* 8.6* 8.1* 8.3*  -Monitor H&H    Essential hypertension: Normotensive. -Continue home amlodipine, irbesartan.  Controlled DM-2 with hyperglycemia: A1c 6.5%.  Hyperglycemia likely due to steroid.  Not on diabetic meds at home Recent Labs  Lab 03/25/21 0745 03/25/21 1235 03/25/21 1716 03/25/21 2018 03/26/21 0748  GLUCAP 140* 162* 262* 289* 122*  -Continue current insulin regimen -Wean from steroid as able    History of anxiety/depression:  -On Seroquel, Zoloft and Valium   GERD: -On  Protonix     Body mass index is 25.84 kg/m. Nutrition Problem: Increased nutrient needs Etiology: cancer and cancer related treatments Signs/Symptoms: estimated needs Interventions: Ensure Enlive (each supplement provides 350kcal and 20 grams of protein), MVI   DVT prophylaxis:  SCDs Start: 03/18/21 1527  Code Status: Full code Family Communication: Patient and/or RN.  Updated patient's wife at bedside Level of care: Progressive Status is: Inpatient  Remains inpatient appropriate because:Ongoing active pain requiring inpatient pain management, IV treatments appropriate due to intensity of illness or inability to take PO, and Inpatient level of care appropriate due to severity of illness  Dispo: The patient is from: Home              Anticipated d/c is to: Home              Patient currently is not medically stable to d/c.   Difficult to place patient No       Consultants:  PCCM Cardiothoracic surgery IR Palliative medicine Oncology Radiation oncology   Sch Meds:  Scheduled Meds:  albuterol  2.5 mg Nebulization Q6H    amLODipine  10 mg Oral Daily   chlorpheniramine-HYDROcodone  5 mL Oral Q12H   diazepam  1 mg Oral Q6H   feeding supplement  237 mL Oral BID BM   ferrous sulfate  325 mg Oral Q breakfast   gabapentin  300 mg Oral BID WC   gabapentin  600 mg Oral QHS   guaiFENesin  600 mg Oral BID   HYDROmorphone   Intravenous Q4H   insulin aspart  0-9 Units Subcutaneous TID WC   irbesartan  300 mg Oral Daily   latanoprost  1 drop Both Eyes QHS   lidocaine  1 patch Transdermal Q24H   multivitamin with minerals  1 tablet Oral Daily   pantoprazole  40 mg Oral Daily   predniSONE  20 mg Oral BID WC   QUEtiapine  100 mg Oral QHS   sertraline  100 mg Oral Daily   sodium chloride flush  3 mL Intravenous Q12H   Continuous Infusions:  tranexamic acid     PRN Meds:.acetaminophen, albuterol, diazepam, diphenhydrAMINE **OR** diphenhydrAMINE, fentaNYL (SUBLIMAZE) injection, HYDROmorphone (DILAUDID) injection, naloxone **AND** sodium chloride flush, ondansetron **OR** ondansetron (ZOFRAN) IV, ondansetron (ZOFRAN) IV, polyethylene glycol  Antimicrobials: Anti-infectives (From admission, onward)    Start  Dose/Rate Route Frequency Ordered Stop   03/19/21 0900  vancomycin (VANCOREADY) IVPB 1250 mg/250 mL  Status:  Discontinued        1,250 mg 166.7 mL/hr over 90 Minutes Intravenous Every 12 hours 03/18/21 1842 03/20/21 1059   03/18/21 2200  ceFEPIme (MAXIPIME) 2 g in sodium chloride 0.9 % 100 mL IVPB        2 g 200 mL/hr over 30 Minutes Intravenous Every 8 hours 03/18/21 1836 03/26/21 0835   03/18/21 1930  vancomycin (VANCOREADY) IVPB 1500 mg/300 mL        1,500 mg 150 mL/hr over 120 Minutes Intravenous  Once 03/18/21 1836 03/19/21 0734   03/18/21 1915  azithromycin (ZITHROMAX) 500 mg in sodium chloride 0.9 % 250 mL IVPB  Status:  Discontinued        500 mg 250 mL/hr over 60 Minutes Intravenous Every 24 hours 03/18/21 1822 03/19/21 0840   03/18/21 1800  penicillin v potassium (VEETID) tablet 500 mg  Status:   Discontinued       Note to Pharmacy: Start date : 03/14/21     500 mg Oral 4 times daily 03/18/21 1527 03/18/21 1842        I have personally reviewed the following labs and images: CBC: Recent Labs  Lab 03/21/21 0057 03/22/21 0212 03/23/21 0532 03/24/21 0224 03/25/21 0357  WBC 18.9* 17.2* 23.3* 23.7* 21.5*  NEUTROABS 15.1* 13.0* 20.3* 20.7* 18.0*  HGB 8.5* 8.5* 8.6* 8.1* 8.3*  HCT 26.9* 25.9* 27.1* 25.2* 25.9*  MCV 94.1 93.2 94.8 93.3 93.8  PLT 372 361 377 412* 416*   BMP &GFR Recent Labs  Lab 03/20/21 0407 03/23/21 0532 03/24/21 0224  NA 134* 134*  --   K 3.8 4.1  --   CL 99 98  --   CO2 24 28  --   GLUCOSE 211* 171*  --   BUN 9 10  --   CREATININE 0.68 0.52*  --   CALCIUM 9.1 9.2  --   MG  --   --  2.1  PHOS  --   --  3.2   Estimated Creatinine Clearance: 103.9 mL/min (A) (by C-G formula based on SCr of 0.52 mg/dL (L)). Liver & Pancreas: No results for input(s): AST, ALT, ALKPHOS, BILITOT, PROT, ALBUMIN in the last 168 hours. No results for input(s): LIPASE, AMYLASE in the last 168 hours. No results for input(s): AMMONIA in the last 168 hours. Diabetic: No results for input(s): HGBA1C in the last 72 hours. Recent Labs  Lab 03/25/21 0745 03/25/21 1235 03/25/21 1716 03/25/21 2018 03/26/21 0748  GLUCAP 140* 162* 262* 289* 122*   Cardiac Enzymes: No results for input(s): CKTOTAL, CKMB, CKMBINDEX, TROPONINI in the last 168 hours. No results for input(s): PROBNP in the last 8760 hours. Coagulation Profile: No results for input(s): INR, PROTIME in the last 168 hours. Thyroid Function Tests: No results for input(s): TSH, T4TOTAL, FREET4, T3FREE, THYROIDAB in the last 72 hours. Lipid Profile: No results for input(s): CHOL, HDL, LDLCALC, TRIG, CHOLHDL, LDLDIRECT in the last 72 hours. Anemia Panel: No results for input(s): VITAMINB12, FOLATE, FERRITIN, TIBC, IRON, RETICCTPCT in the last 72 hours. Urine analysis:    Component Value Date/Time   COLORURINE  YELLOW 01/18/2021 1500   APPEARANCEUR CLEAR 01/18/2021 1500   LABSPEC 1.006 01/18/2021 1500   PHURINE 6.0 01/18/2021 1500   GLUCOSEU NEGATIVE 01/18/2021 1500   HGBUR SMALL (A) 01/18/2021 1500   BILIRUBINUR NEGATIVE 01/18/2021 1500   KETONESUR NEGATIVE 01/18/2021 1500  PROTEINUR NEGATIVE 01/18/2021 1500   NITRITE NEGATIVE 01/18/2021 1500   LEUKOCYTESUR NEGATIVE 01/18/2021 1500   Sepsis Labs: Invalid input(s): PROCALCITONIN, [place]  Microbiology: Recent Results (from the past 240 hour(s))  Resp Panel by RT-PCR (Flu A&B, Covid) Nasopharyngeal Swab     Status: None   Collection Time: 03/18/21  2:57 AM   Specimen: Nasopharyngeal Swab; Nasopharyngeal(NP) swabs in vial transport medium  Result Value Ref Range Status   SARS Coronavirus 2 by RT PCR NEGATIVE NEGATIVE Final    Comment: (NOTE) SARS-CoV-2 target nucleic acids are NOT DETECTED.  The SARS-CoV-2 RNA is generally detectable in upper respiratory specimens during the acute phase of infection. The lowest concentration of SARS-CoV-2 viral copies this assay can detect is 138 copies/mL. A negative result does not preclude SARS-Cov-2 infection and should not be used as the sole basis for treatment or other patient management decisions. A negative result may occur with  improper specimen collection/handling, submission of specimen other than nasopharyngeal swab, presence of viral mutation(s) within the areas targeted by this assay, and inadequate number of viral copies(<138 copies/mL). A negative result must be combined with clinical observations, patient history, and epidemiological information. The expected result is Negative.  Fact Sheet for Patients:  EntrepreneurPulse.com.au  Fact Sheet for Healthcare Providers:  IncredibleEmployment.be  This test is no t yet approved or cleared by the [place] FDA and  has been authorized for detection and/or diagnosis of SARS-CoV-2 by FDA  under an Emergency Use Authorization (EUA). This EUA will remain  in effect (meaning this test can be used) for the duration of the COVID-19 declaration under Section 564(b)(1) of the Act, 21 U.S.C.section 360bbb-3(b)(1), unless the authorization is terminated  or revoked sooner.       Influenza A by PCR NEGATIVE NEGATIVE Final   Influenza B by PCR NEGATIVE NEGATIVE Final    Comment: (NOTE) The Xpert Xpress SARS-CoV-2/FLU/RSV plus assay is intended as an aid in the diagnosis of influenza from Nasopharyngeal swab specimens and should not be used as a sole basis for treatment. Nasal washings and aspirates are unacceptable for Xpert Xpress SARS-CoV-2/FLU/RSV testing.  Fact Sheet for Patients: EntrepreneurPulse.com.au  Fact Sheet for Healthcare Providers: IncredibleEmployment.be  This test is not yet approved or cleared by the [place] FDA and has been authorized for detection and/or diagnosis of SARS-CoV-2 by FDA under an Emergency Use Authorization (EUA). This EUA will remain in effect (meaning this test can be used) for the duration of the COVID-19 declaration under Section 564(b)(1) of the Act, 21 U.S.C. section 360bbb-3(b)(1), unless the authorization is terminated or revoked.  Performed at [facility], [address] [place], [place] [address]   MRSA Next Gen by PCR, Nasal     Status: None   Collection Time: 03/19/21  8:41 AM   Specimen: Nasal Mucosa; Nasal Swab  Result Value Ref Range Status   MRSA by PCR Next Gen NOT DETECTED NOT DETECTED Final    Comment: (NOTE) The GeneXpert MRSA Assay (FDA approved for NASAL specimens only), is one component of a comprehensive MRSA colonization surveillance program. It is not intended to diagnose MRSA infection nor to guide or monitor treatment for MRSA infections. Test performance is not FDA approved in patients less than 38 years old. Performed at [facility], [place] [address], [place], [place] [address]   Expectorated Sputum Assessment w Gram Stain, Rflx to Resp Cult     Status: None   Collection Time: 03/20/21  9:24 PM  Specimen: Sputum  Result Value Ref Range Status   Specimen Description SPUTUM  Final   Special Requests NONE  Final   Sputum evaluation   Final    THIS SPECIMEN IS ACCEPTABLE FOR SPUTUM CULTURE Performed at [facility], [address], [place], [place] [address]    Report Status 03/21/2021 FINAL  Final  Culture, Respiratory w Gram Stain     Status: None   Collection Time: 03/20/21  9:24 PM   Specimen: SPU  Result Value Ref Range Status   Specimen Description SPUTUM  Final   Special Requests NONE Reflexed from [id number]  Final   Gram Stain   Final    FEW WBC PRESENT,BOTH PMN AND MONONUCLEAR FEW GRAM POSITIVE COCCI RARE GRAM NEGATIVE RODS    Culture   Final    RARE Normal respiratory flora-no Staph aureus or Pseudomonas seen Performed at [facility], [place] [address], [place], [place] [address]    Report Status 03/24/2021 FINAL  Final    Radiology Studies: No results found.    Angelyne Terwilliger T. [place]  If 7PM-7AM, please contact night-coverage [email or web address] 03/26/2021, 12:25 PM

## 2021-03-27 ENCOUNTER — Encounter (HOSPITAL_COMMUNITY): Payer: Self-pay

## 2021-03-27 ENCOUNTER — Ambulatory Visit
Admit: 2021-03-27 | Discharge: 2021-03-27 | Disposition: A | Discharge status: Home / Self Care | Payer: 59 | Attending: Radiation Oncology | Admitting: Radiation Oncology

## 2021-03-27 ENCOUNTER — Inpatient Hospital Stay (HOSPITAL_COMMUNITY): Payer: 59

## 2021-03-27 DIAGNOSIS — I10 Essential (primary) hypertension: Secondary | ICD-10-CM | POA: Diagnosis not present

## 2021-03-27 DIAGNOSIS — R0489 Hemorrhage from other sites in respiratory passages: Secondary | ICD-10-CM | POA: Diagnosis not present

## 2021-03-27 DIAGNOSIS — J9621 Acute and chronic respiratory failure with hypoxia: Secondary | ICD-10-CM | POA: Diagnosis not present

## 2021-03-27 DIAGNOSIS — D72829 Elevated white blood cell count, unspecified: Secondary | ICD-10-CM | POA: Diagnosis not present

## 2021-03-27 LAB — RENAL FUNCTION PANEL
Albumin: 2.6 g/dL — ABNORMAL LOW (ref 3.5–5.0)
Anion gap: 10 (ref 5–15)
BUN: 13 mg/dL (ref 6–20)
CO2: 29 mmol/L (ref 22–32)
Calcium: 9 mg/dL (ref 8.9–10.3)
Chloride: 100 mmol/L (ref 98–111)
Creatinine, Ser: 0.42 mg/dL — ABNORMAL LOW (ref 0.61–1.24)
GFR, Estimated: >60 mL/min (ref >60)
Glucose, Bld: 238 mg/dL — ABNORMAL HIGH (ref 70–99)
Phosphorus: 2.9 mg/dL (ref 2.5–4.6)
Potassium: 4.1 mmol/L (ref 3.5–5.1)
Sodium: 139 mmol/L (ref 135–145)

## 2021-03-27 LAB — CBC
HCT: 29.2 % — ABNORMAL LOW (ref 39.0–52.0)
Hemoglobin: 9.2 g/dL — ABNORMAL LOW (ref 13.0–17.0)
MCH: 30.2 pg (ref 26.0–34.0)
MCHC: 31.5 g/dL (ref 30.0–36.0)
MCV: 95.7 fL (ref 80.0–100.0)
Platelets: 467 10*3/uL — ABNORMAL HIGH (ref 150–400)
RBC: 3.05 MIL/uL — ABNORMAL LOW (ref 4.22–5.81)
RDW: 15.2 % (ref 11.5–15.5)
WBC: 20.4 10*3/uL — ABNORMAL HIGH (ref 4.0–10.5)
nRBC: 0 % (ref 0.0–0.2)

## 2021-03-27 LAB — GLUCOSE, CAPILLARY
Glucose-Capillary: 245 mg/dL — ABNORMAL HIGH (ref 70–99)
Glucose-Capillary: 240 mg/dL — ABNORMAL HIGH (ref 70–99)
Glucose-Capillary: 234 mg/dL — ABNORMAL HIGH (ref 70–99)
Glucose-Capillary: 356 mg/dL — ABNORMAL HIGH (ref 70–99)

## 2021-03-27 LAB — MAGNESIUM: Magnesium: 2 mg/dL (ref 1.7–2.4)

## 2021-03-27 IMAGING — CT CT CHEST W/ CM
2 of 3 series · 15 of 36 positions shown, 18 images · IV contrast (omnipaque)
Comparison: Multiple recent radiographs.  Chest CT 9 days ago.

CLINICAL DATA: Persistent chest pain. History of right upper
lobectomy and recent CT suspicious for recurrence.

EXAM:
CT CHEST WITH CONTRAST
TECHNIQUE: Multidetector CT imaging of the chest was performed during
intravenous contrast administration.
CONTRAST:  75mL OMNIPAQUE IOHEXOL 350 MG/ML SOLN

[Series 2: axial st · axial · 0.74mm/px · z∈[-241,+47]mm · 12 of 170 slices shown, 15 images]
[im 13/170  mediastinal]
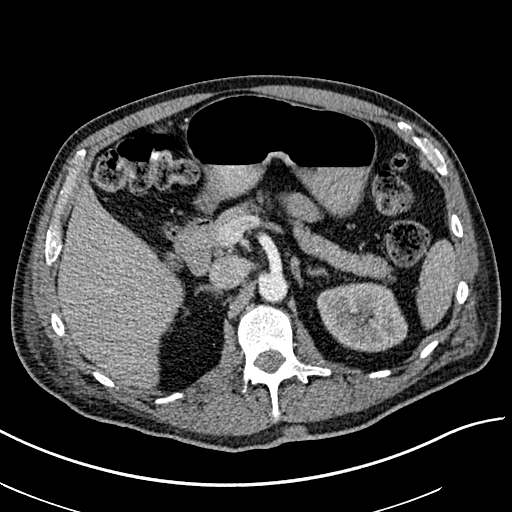
[im 13/170  lung]
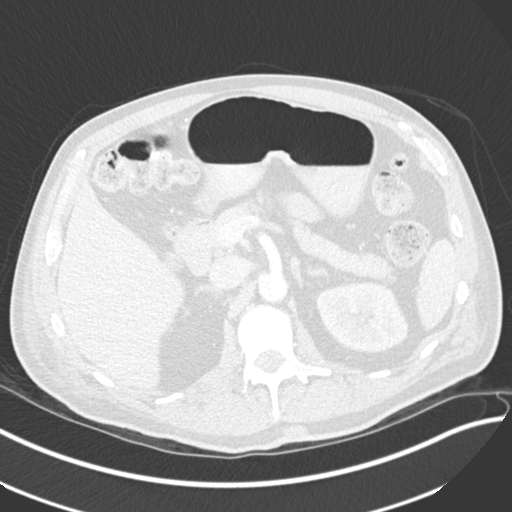
[im 26/170  lung]
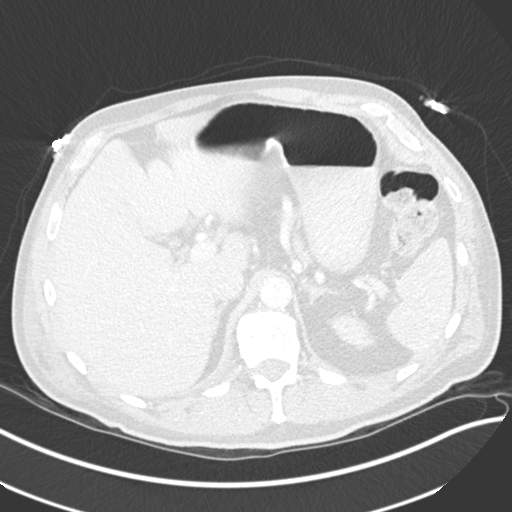
[im 38/170  lung]
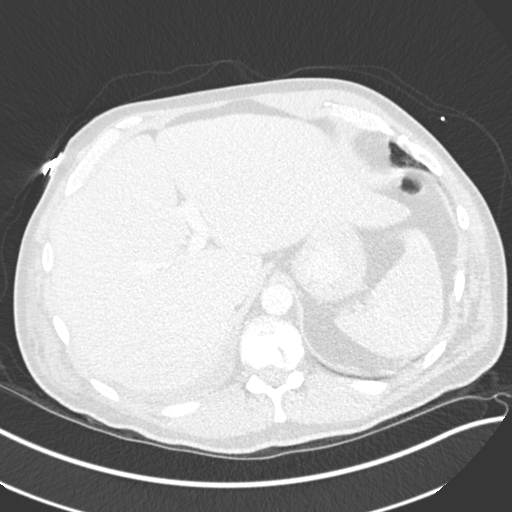
[im 51/170  lung]
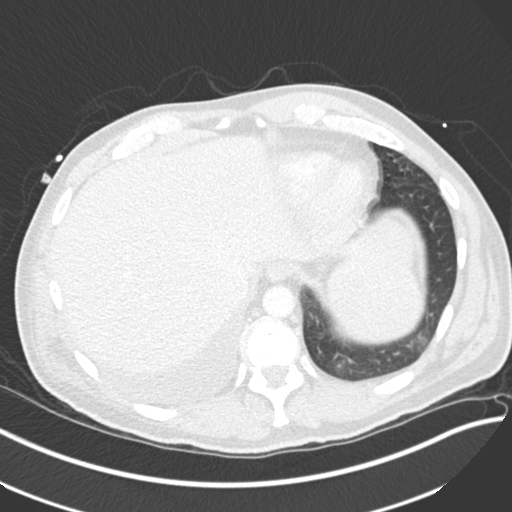
[im 63/170  mediastinal]
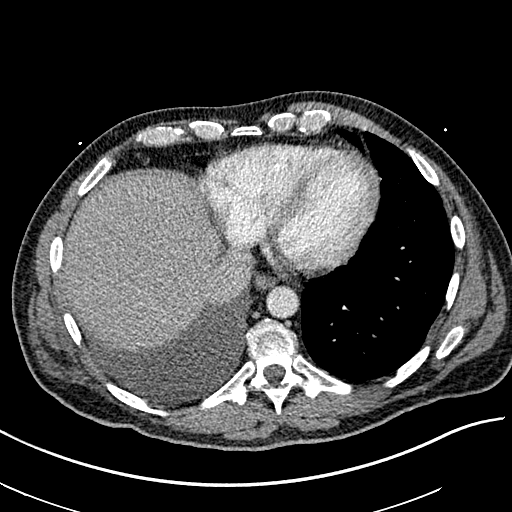
[im 63/170  lung]
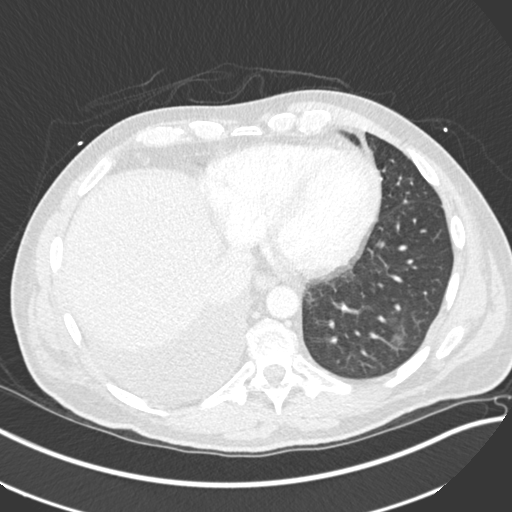
[im 76/170  lung]
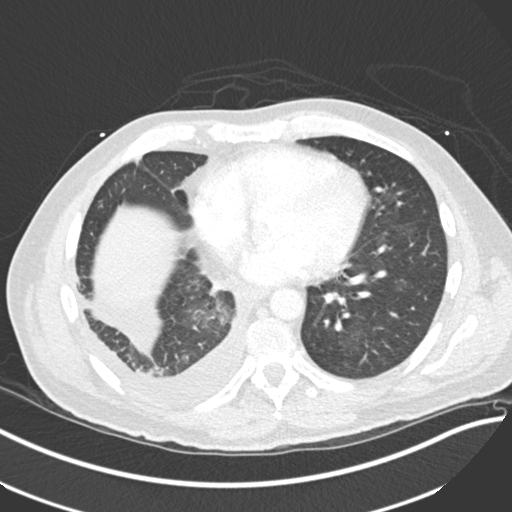
[im 94/170  lung]
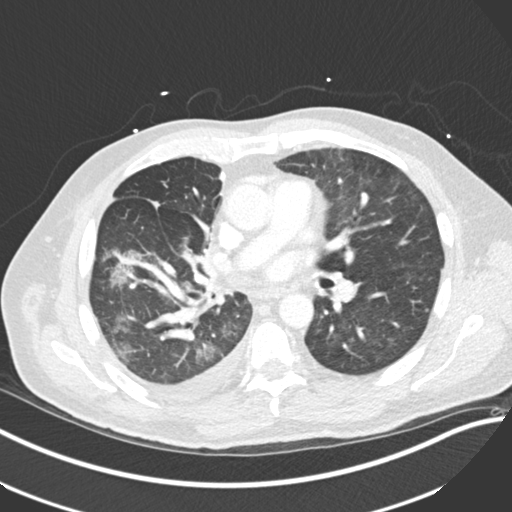
[im 107/170  lung]
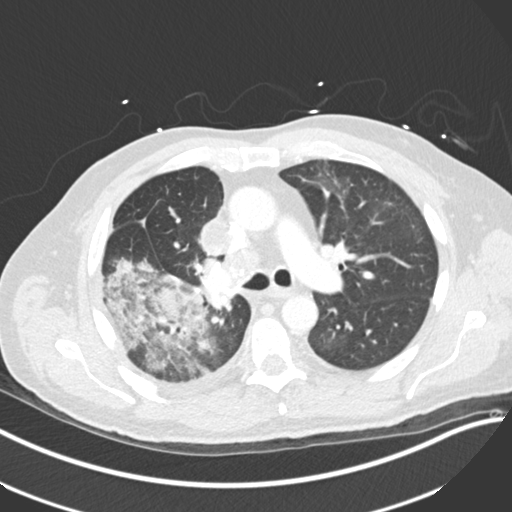
[im 119/170  mediastinal]
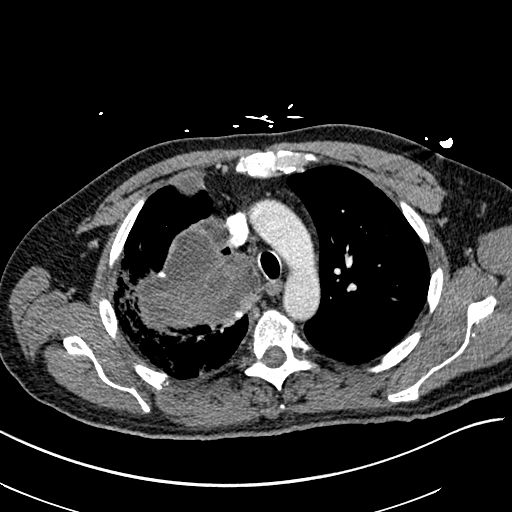
[im 119/170  lung]
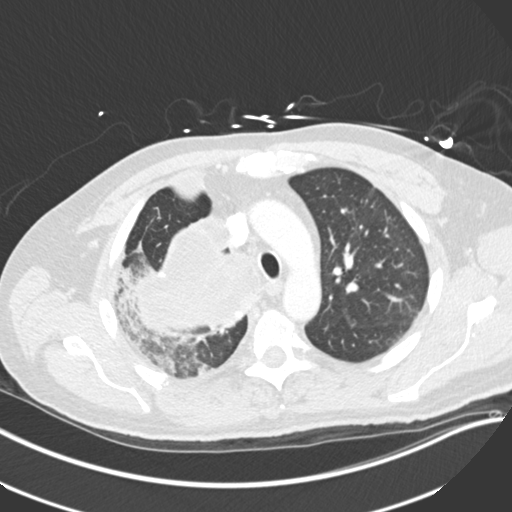
[im 132/170  lung]
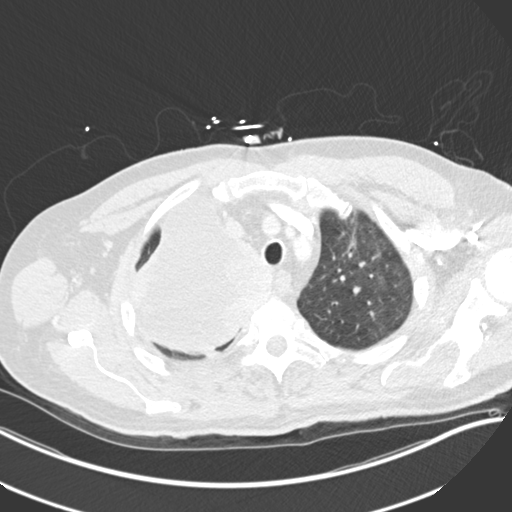
[im 144/170  lung]
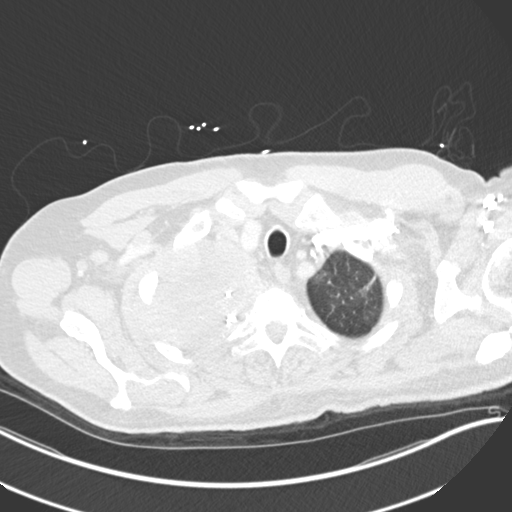
[im 157/170  lung]
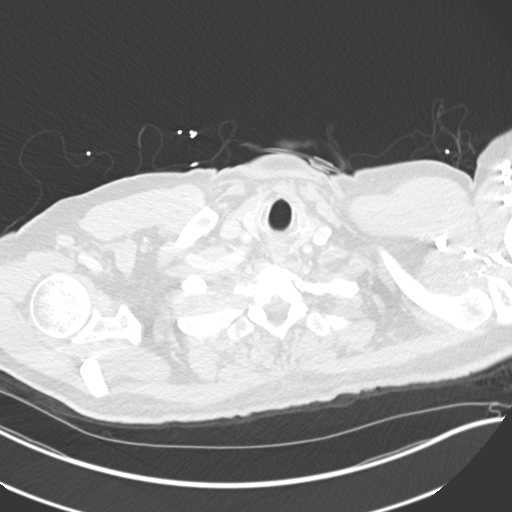

[Series 6: coronal · coronal · 0.70mm/px · 3 of 134 slices shown]
[im 27/134  lung]
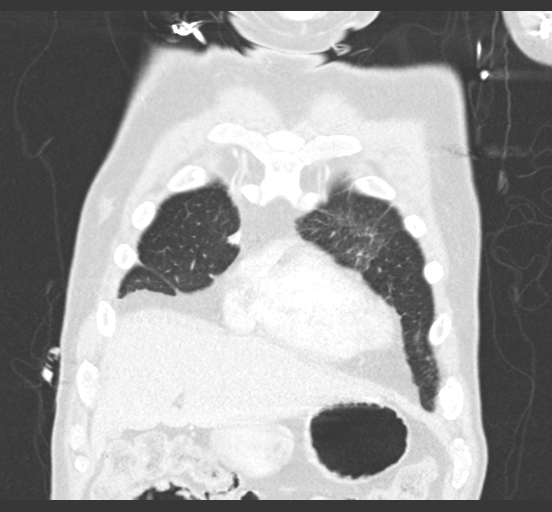
[im 54/134  lung]
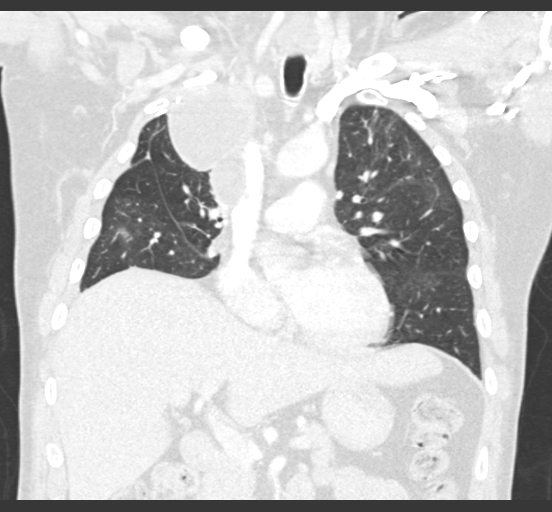
[im 80/134  lung]
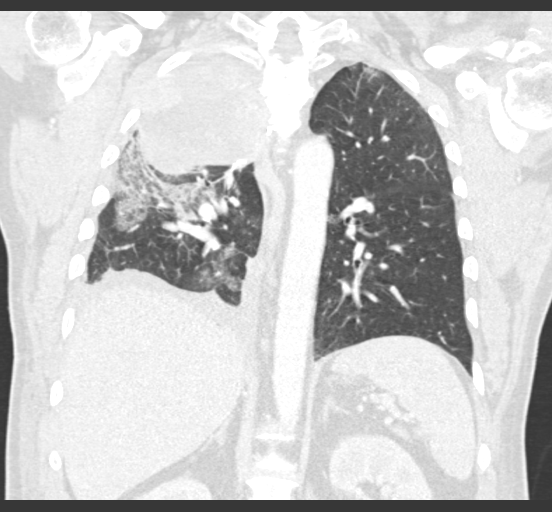

[15 of 36 positions shown; findings below may reference images not displayed]

FINDINGS: Cardiovascular: Normal caliber thoracic aorta. Mild atherosclerosis.
Coronary artery calcifications. Normal heart size. No pericardial
effusion. No obvious pulmonary embolus on this non angiographic
exam.

Mediastinum/Nodes: There are enlarged right lower paratracheal nodes
measuring 16 and 17 mm, unchanged allowing for differences in
caliper placement. Additional smaller mediastinal lymph nodes are
similar. No thyroid nodule. No esophageal wall thickening.

Lungs/Pleura: History of right upper lobectomy. Loculated collection
at the right lung apex lined by thick masslike nodularity. This is
not significantly changed in the interim. This again measures at
least 10 cm ostial dimension. There scattered calcifications. There
is surrounding ground-glass opacity extending into the adjacent
right lung with improvement from prior exam. Small right pleural
effusion is similar there is a posterior pleural based nodule in the
right hemithorax measuring 17 mm, series 2, image 46.

Upper Abdomen: No acute findings.

Musculoskeletal: Postsurgical change of right posterior upper ribs.
There is no frank bony destruction related to right apical masslike
opacity. No discrete osseous lesions. No definite chest wall
extension of apical lesion.
IMPRESSION: 1. No significant change in loculated collection at the right lung
apex lined by thick masslike nodularity, suspicious for malignancy
recurrence.
2. Mild improvement in surrounding ground-glass opacity in the
adjacent right lung, likely representing improving hemorrhage.
3. Unchanged small right pleural effusion. Unchanged pleural based
nodule in the posterior right hemithorax.
4. Enlarged right lower paratracheal lymph nodes are similar to
recent prior.
5. Coronary artery calcifications.

Aortic Atherosclerosis ([NN]-[NN]).

## 2021-03-27 IMAGING — DX DG CHEST 1V PORT
1 series · 1 of 1 positions shown · non-contrast
Comparison: [DATE]

CLINICAL DATA: Right upper lobectomy

EXAM:
PORTABLE CHEST 1 VIEW

[chest ap]
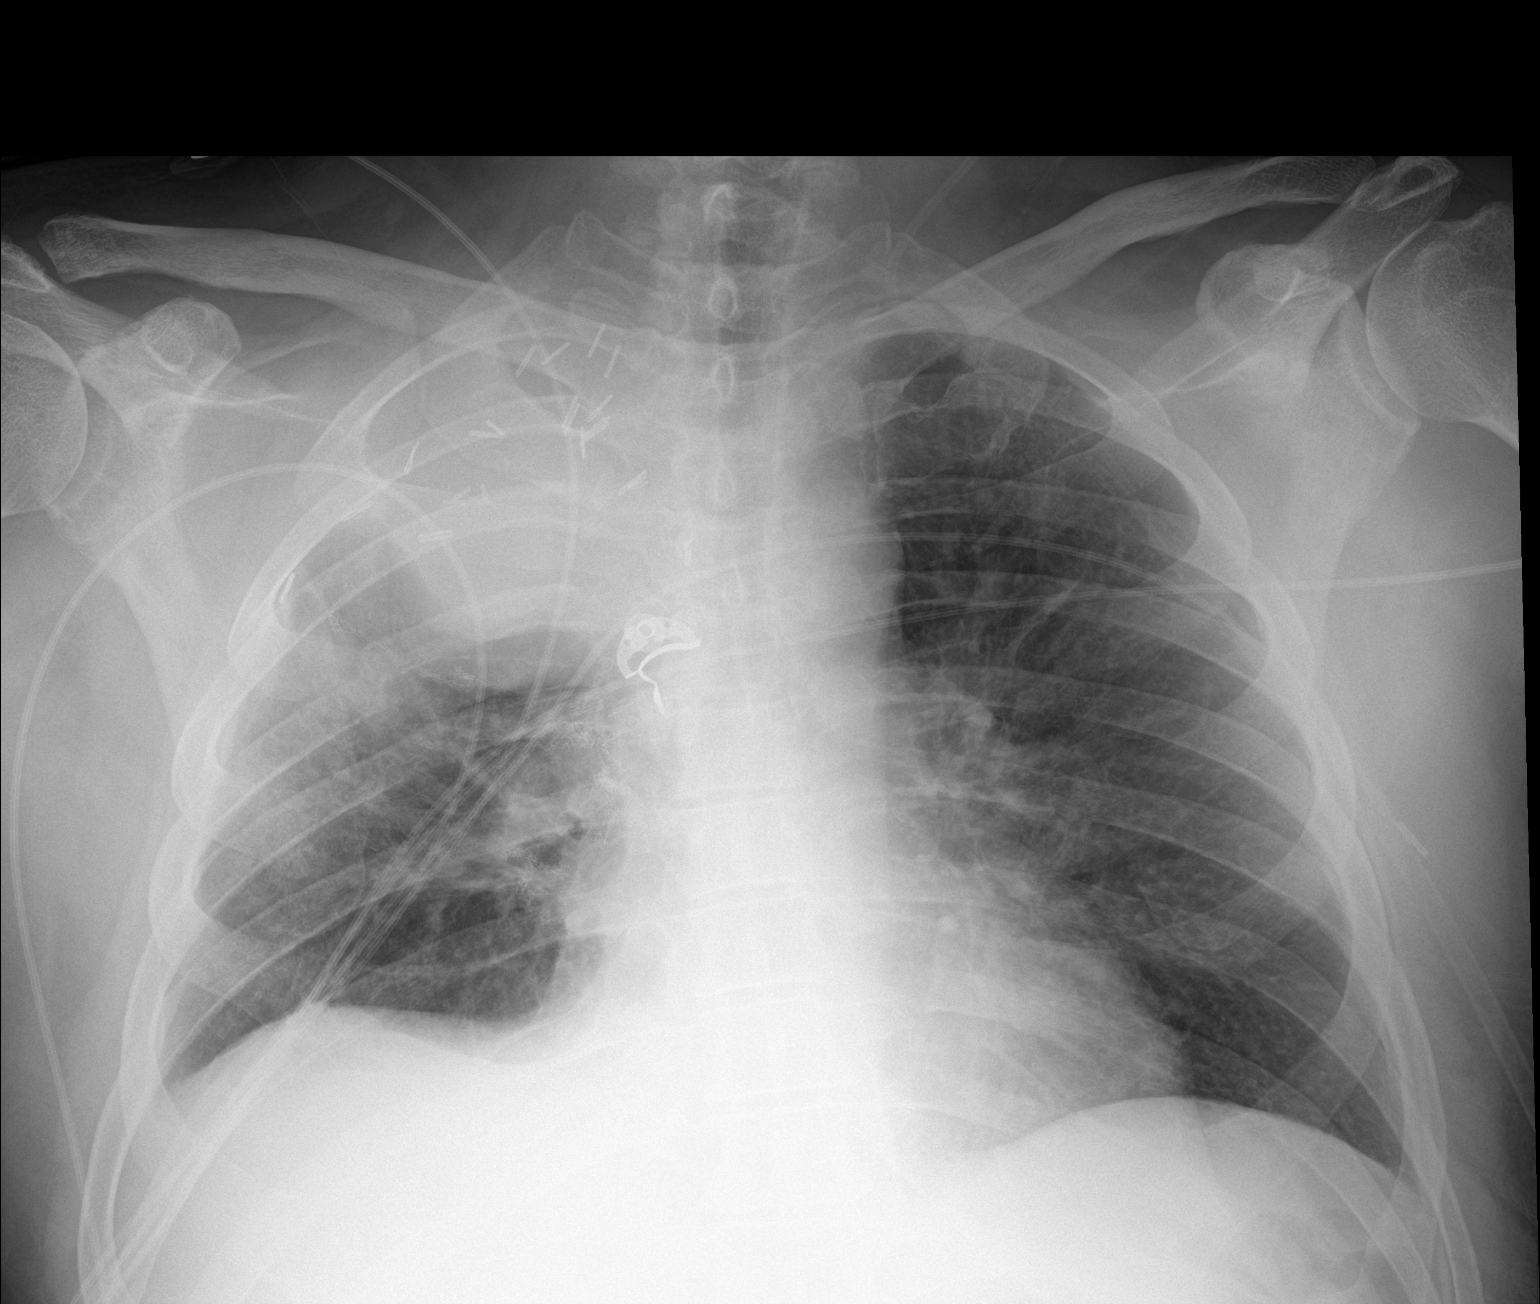

[1 of 1 positions shown; findings below may reference images not displayed]

FINDINGS: Postsurgical changes in the right upper lobe. Persistent right upper
lobe pulmonary mass unchanged from [DATE]. No pleural effusion
or pneumothorax. Stable cardiomediastinal silhouette. No aggressive
osseous lesion.
IMPRESSION: Postsurgical changes in the right upper lobe. Persistent right upper
lobe pulmonary mass unchanged from [DATE].

## 2021-03-27 MED ORDER — METHYLPREDNISOLONE SODIUM SUCC 125 MG IJ SOLR
60.0000 mg | Freq: Four times a day (QID) | INTRAMUSCULAR | Status: DC
  Administered 2021-03-27 – 2021-03-28 (×4): 60 mg via INTRAVENOUS
  Filled 2021-03-27 (×4): qty 2

## 2021-03-27 MED ORDER — ENSURE ENLIVE PO LIQD
237.0000 mL | Freq: Two times a day (BID) | ORAL | Status: DC
  Administered 2021-03-28 – 2021-04-09 (×16): 237 mL via ORAL

## 2021-03-27 MED ORDER — FENTANYL 50 MCG/HR TD PT72
1.0000 | MEDICATED_PATCH | TRANSDERMAL | Status: DC
Start: 2021-03-28 — End: 2021-03-29
  Administered 2021-03-27: 1 via TRANSDERMAL
  Filled 2021-03-27: qty 1

## 2021-03-27 MED ORDER — INSULIN ASPART 100 UNIT/ML IJ SOLN
0.0000 [IU] | Freq: Every day | INTRAMUSCULAR | Status: DC
  Administered 2021-03-27: 5 [IU] via SUBCUTANEOUS

## 2021-03-27 MED ORDER — FENTANYL CITRATE (PF) 100 MCG/2ML IJ SOLN
50.0000 ug | INTRAMUSCULAR | Status: DC | PRN
  Administered 2021-03-29: 50 ug via INTRAVENOUS
  Filled 2021-03-27: qty 2

## 2021-03-27 MED ORDER — PROSOURCE PLUS PO LIQD
30.0000 mL | Freq: Two times a day (BID) | ORAL | Status: DC
  Administered 2021-03-27 – 2021-04-10 (×25): 30 mL via ORAL
  Filled 2021-03-27 (×25): qty 30

## 2021-03-27 MED ORDER — IOHEXOL 350 MG/ML SOLN
75.0000 mL | Freq: Once | INTRAVENOUS | Status: AC | PRN
  Administered 2021-03-27: 75 mL via INTRAVENOUS

## 2021-03-27 MED ORDER — INSULIN ASPART 100 UNIT/ML IJ SOLN
0.0000 [IU] | Freq: Three times a day (TID) | INTRAMUSCULAR | Status: DC
  Administered 2021-03-27: 5 [IU] via SUBCUTANEOUS
  Administered 2021-03-27: 3 [IU] via SUBCUTANEOUS

## 2021-03-27 MED ORDER — BENZONATATE 100 MG PO CAPS
200.0000 mg | ORAL_CAPSULE | Freq: Two times a day (BID) | ORAL | Status: DC
  Administered 2021-03-28 – 2021-04-10 (×28): 200 mg via ORAL
  Filled 2021-03-27 (×28): qty 2

## 2021-03-27 MED ORDER — FENTANYL CITRATE (PF) 100 MCG/2ML IJ SOLN
25.0000 ug | Freq: Once | INTRAMUSCULAR | Status: AC
Start: 2021-03-27 — End: 2021-03-27

## 2021-03-27 NOTE — Progress Notes (Signed)
PROGRESS NOTE  BELTON PEPLINSKI RJJ:[id number] DOB: 12-22-1960   PCP: [name], MD  Patient is from: Home  DOA: 03/18/2021 LOS: 8  Chief complaints:  Chief Complaint  Patient presents with   Hemoptysis   Back Pain     Brief Narrative / Interim history: 60 year old M with PMH of stage IV adenocarcinoma s/p RUL lobectomy with node dissection on 5/16, recurrent hemoptysis, chronic RF on 5 L, tobacco abuse, HTN, depression, loculated hemithorax treated with IR chest tube placement, empiric antibiotics and prednisone taper and discharged to follow-up with CTS and radiation oncology.  He returned with cough with hemoptysis, right upper back pain, and increased oxygen requirement.  CT chest showed airspace disease in right lung representing alveolar hemorrhage with recurrence of apical fluid collection, nodular masslike lesion concerning for recurrence of malignancy and right peritracheal adenopathy.  He was admitted to [facility].  CTS, pulmonary IR and palliative consulted.  Started on IV cefepime, systemic steroid and tranexamic acid nebulization.  He underwent bronchoscopy that did not reveal active bleeding.  Hemoptysis seems to have resolved.  Started on PCA Dilaudid by PMT for pain control, and transferred to [facility] for radiation oncology care.  Subjective: Seen and examined earlier this morning.  No major events overnight.  Still with significant pain and discomfort.  He rates his pain 8-8.5 on a scale of 10.  Pain is over his right upper back radiating to his right scapula and neck area.  Some cough with hemoptysis.  Having regular bowel movements.  Objective: Vitals:   03/27/21 1100 03/27/21 1109 03/27/21 1327 03/27/21 1449  BP:   (!) 153/77   Pulse:   (!) 108   Resp: 20 13 19 17   Temp:   98.3 F (36.8 C)   TempSrc:   Oral   SpO2: 97% 96% 97% 97%  Weight:      Height:        Intake/Output Summary (Last 24 hours) at 03/27/2021 1523 Last data filed at 03/27/2021  0445 Gross per 24 hour  Intake 120 ml  Output 1750 ml  Net -1630 ml   Filed Weights   03/18/21 0240 03/18/21 0242 03/25/21 1542  Weight: 78.9 kg 79.4 kg 81.7 kg    Examination:  GENERAL: No apparent distress.  Nontoxic. HEENT: MMM.  Vision and hearing grossly intact.  NECK: Supple.  No apparent JVD.  RESP: On 4 L.  No IWOB.  Fair aeration bilaterally. CVS:  RRR. Heart sounds normal.  ABD/GI/GU: BS+. Abd soft, NTND.  MSK/EXT:  Moves extremities. No apparent deformity. No edema.  SKIN: no apparent skin lesion or wound NEURO: Awake and alert. Oriented appropriately.  No apparent focal neuro deficit. PSYCH: Calm. Normal affect.   Procedures:  7/11-bronchoscopy  Microbiology summarized: [id number] and influenza PCR nonreactive. MRSA PCR screen negative. 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 s/p RUL lobectomy and node dissection by Dr. Koleen Nimrod on 5/16 Acute on chronic respiratory failure with hypoxia-on 4 L at baseline. -CT chest showed airspace disease in right lung representing alveolar hemorrhage with recurrence of apical fluid collection, nodular masslike lesion concerning for recurrence of malignancy and right peritracheal LAD -Respiratory culture with normal respiratory flora -Bronchoscopy on 7/11-no active hemorrhage -Hemoptysis reportedly worse today. -Already completed 7 days of IV cefepime on 7/18 -Radiation oncology started radiation on 7/13>>> reportedly planned for 6  and half week -Palliative care changed PCA to fentanyl due to concern for opioid neurotoxicity or hyperalgesia. -Appreciate help by PCCM-weaned Solu-Medrol to p.o. prednisone on 7/18 -Agree with repeat CT chest to assess changes -Oncology to arrange outpatient follow-up for systemic chemotherapy after radiation therapy     Uncontrolled cancer-related pain:  -Palliative care managing-on fentanyl PCA -On Valium for anxiety.   Leukocytosis: Likely demargination from steroid. -Continue monitoring   Tooth abscess: Was on penicillin V outpatient.  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 Recent Labs    03/18/21 1551 03/18/21 2208 03/19/21 0215 03/20/21 0407 03/21/21 0057 03/22/21 0212 03/23/21 0532 03/24/21 0224 03/25/21 0357 03/27/21 0749  HGB 9.3* 9.8* 9.4* 8.8* 8.5* 8.5* 8.6* 8.1* 8.3* 9.2*  -Monitor H&H    Essential hypertension: Normotensive. -Continue home amlodipine, irbesartan.  Controlled DM-2 with hyperglycemia: A1c 6.5%.  Hyperglycemia likely due to steroid.  Not on meds at home Recent Labs  Lab 03/26/21 1315 03/26/21 1633 03/26/21 2047 03/27/21 0749 03/27/21 1138  GLUCAP 204* 254* 241* 245* 240*  -Increase SSI to moderate -Further adjustment as appropriate. -Wean from steroid as able    History of anxiety/depression:  -On Seroquel, Zoloft and Valium   GERD: -On  Protonix     Body mass index is 25.84 kg/m. Nutrition Problem: Increased nutrient needs Etiology: cancer and cancer related treatments Signs/Symptoms: estimated needs Interventions: Ensure Enlive (each supplement provides 350kcal and 20 grams of protein), MVI   DVT prophylaxis:  SCDs Start: 03/18/21 1527  Code Status: Full code Family Communication: Patient and/or RN.  Updated patient's wife at bedside on 7/18.  None at bedside today Level of care: Progressive Status is: Inpatient  Remains inpatient appropriate because:Ongoing active pain requiring inpatient pain management, IV treatments appropriate due to intensity of illness or inability to take PO, and Inpatient level of care appropriate due to severity of illness  Dispo: The patient is from: Home              Anticipated d/c is to: Home               Patient currently is not medically stable to d/c.   Difficult to place patient No       Consultants:  PCCM Cardiothoracic surgery-signed off IR-signed off Palliative medicine Oncology Radiation oncology   Sch Meds:  Scheduled Meds:  (feeding supplement) PROSource Plus  30 mL Oral BID BM   albuterol  2.5 mg Nebulization Q6H   amLODipine  10 mg Oral Daily   celecoxib  200 mg Oral BID   diazepam  2 mg Oral Q6H   [START ON 03/28/2021] feeding supplement  237 mL Oral BID BM   fentaNYL   Intravenous Q4H   ferrous sulfate  325 mg Oral Q breakfast   gabapentin  300 mg Oral BID WC   gabapentin  600 mg Oral QHS   guaiFENesin  600 mg Oral BID   insulin aspart  0-15 Units Subcutaneous TID WC   insulin aspart  0-5 Units Subcutaneous QHS   irbesartan  300 mg Oral Daily   latanoprost  1 drop Both Eyes QHS   lidocaine  1 patch Transdermal Q24H   methylPREDNISolone (SOLU-MEDROL) injection  60 mg Intravenous Q6H   multivitamin with minerals  1 tablet Oral Daily   pantoprazole  40 mg Oral Daily   QUEtiapine  100 mg Oral QHS   sertraline  100 mg Oral Daily   sodium chloride flush  3 mL Intravenous Q12H   Continuous Infusions:  tranexamic acid     PRN Meds:.acetaminophen, albuterol, diazepam, diphenhydrAMINE **OR** diphenhydrAMINE, fentaNYL (SUBLIMAZE) injection, naloxone **AND** sodium chloride flush, ondansetron **OR** ondansetron (ZOFRAN) IV, ondansetron (ZOFRAN) IV, polyethylene glycol  Antimicrobials: Anti-infectives (From admission, onward)    Start     Dose/Rate Route Frequency Ordered Stop   03/19/21 0900  vancomycin (VANCOREADY) IVPB 1250 mg/250 mL  Status:  Discontinued        1,250 mg 166.7 mL/hr over 90 Minutes Intravenous Every 12 hours 03/18/21 1842 03/20/21 1059   03/18/21 2200  ceFEPIme (MAXIPIME) 2 g in sodium chloride 0.9 % 100 mL IVPB        2 g 200 mL/hr over 30 Minutes Intravenous Every 8 hours 03/18/21 1836 03/26/21 0835   03/18/21 1930  vancomycin  (VANCOREADY) IVPB 1500 mg/300 mL        1,500 mg 150 mL/hr over 120 Minutes Intravenous  Once 03/18/21 1836 03/19/21 0734   03/18/21 1915  azithromycin (ZITHROMAX) 500 mg in sodium chloride 0.9 % 250 mL IVPB  Status:  Discontinued        500 mg 250 mL/hr over 60 Minutes Intravenous Every 24 hours 03/18/21 1822 03/19/21 0840   03/18/21 1800  penicillin v potassium (VEETID) tablet 500 mg  Status:  Discontinued       Note to Pharmacy: Start date : 03/14/21     500 mg Oral 4 times daily 03/18/21 1527 03/18/21 1842        I have personally reviewed the following labs and images: CBC: Recent Labs  Lab 03/21/21 0057 03/22/21 0212 03/23/21 0532 03/24/21 0224 03/25/21 0357 03/27/21 0749  WBC 18.9* 17.2* 23.3* 23.7* 21.5* 20.4*  NEUTROABS 15.1* 13.0* 20.3* 20.7* 18.0*  --   HGB 8.5* 8.5* 8.6* 8.1* 8.3* 9.2*  HCT 26.9* 25.9* 27.1* 25.2* 25.9* 29.2*  MCV 94.1 93.2 94.8 93.3 93.8 95.7  PLT 372 361 377 412* 416* 467*   BMP &GFR Recent Labs  Lab 03/23/21 0532 03/24/21 0224 03/27/21 0749  NA 134*  --  139  K 4.1  --  4.1  CL 98  --  100  CO2 28  --  29  GLUCOSE 171*  --  238*  BUN 10  --  13  CREATININE 0.52*  --  0.42*  CALCIUM 9.2  --  9.0  MG  --  2.1 2.0  PHOS  --  3.2 2.9   Estimated Creatinine Clearance: 103.9 mL/min (A) (by C-G formula based on SCr of 0.42 mg/dL (L)). Liver & Pancreas: Recent Labs  Lab 03/27/21 0749  ALBUMIN 2.6*   No results for input(s): LIPASE, AMYLASE in the last 168 hours. No results for input(s): AMMONIA in the last 168 hours. Diabetic: No results for input(s): HGBA1C in the last 72 hours. Recent Labs  Lab 03/26/21 1315 03/26/21 1633 03/26/21 2047 03/27/21 0749 03/27/21 1138  GLUCAP 204* 254* 241* 245* 240*   Cardiac Enzymes: No results for input(s): CKTOTAL, CKMB, CKMBINDEX, TROPONINI in the last 168 hours. No results for input(s): PROBNP in the last 8760 hours. Coagulation Profile: No results for input(s): INR, PROTIME in the  last 168 hours. Thyroid Function Tests: No results for input(s): TSH, T4TOTAL, FREET4, T3FREE, THYROIDAB in the last 72 hours. Lipid Profile: No results for input(s): CHOL, HDL, LDLCALC, TRIG, CHOLHDL, LDLDIRECT in the last 72 hours. Anemia Panel: No results for input(s): VITAMINB12, FOLATE, FERRITIN,  TIBC, IRON, RETICCTPCT in the last 72 hours. Urine analysis:    Component Value Date/Time   COLORURINE YELLOW 01/18/2021 1500   APPEARANCEUR CLEAR 01/18/2021 1500   LABSPEC 1.006 01/18/2021 1500   PHURINE 6.0 01/18/2021 1500   GLUCOSEU NEGATIVE 01/18/2021 1500   HGBUR SMALL (A) 01/18/2021 1500   BILIRUBINUR NEGATIVE 01/18/2021 1500   KETONESUR NEGATIVE 01/18/2021 1500   PROTEINUR NEGATIVE 01/18/2021 1500   NITRITE NEGATIVE 01/18/2021 1500   LEUKOCYTESUR NEGATIVE 01/18/2021 1500   Sepsis Labs: Invalid input(s): PROCALCITONIN, [place]  Microbiology: Recent Results (from the past 240 hour(s))  Resp Panel by RT-PCR (Flu A&B, Covid) Nasopharyngeal Swab     Status: None   Collection Time: 03/18/21  2:57 AM   Specimen: Nasopharyngeal Swab; Nasopharyngeal(NP) swabs in vial transport medium  Result Value Ref Range Status   SARS Coronavirus 2 by RT PCR NEGATIVE NEGATIVE Final    Comment: (NOTE) SARS-CoV-2 target nucleic acids are NOT DETECTED.  The SARS-CoV-2 RNA is generally detectable in upper respiratory specimens during the acute phase of infection. The lowest concentration of SARS-CoV-2 viral copies this assay can detect is 138 copies/mL. A negative result does not preclude SARS-Cov-2 infection and should not be used as the sole basis for treatment or other patient management decisions. A negative result may occur with  improper specimen collection/handling, submission of specimen other than nasopharyngeal swab, presence of viral mutation(s) within the areas targeted by this assay, and inadequate number of viral copies(<138 copies/mL). A negative result must be combined  with clinical observations, patient history, and epidemiological information. The expected result is Negative.  Fact Sheet for Patients:  EntrepreneurPulse.com.au  Fact Sheet for Healthcare Providers:  IncredibleEmployment.be  This test is no t yet approved or cleared by the [place] FDA and  has been authorized for detection and/or diagnosis of SARS-CoV-2 by FDA under an Emergency Use Authorization (EUA). This EUA will remain  in effect (meaning this test can be used) for the duration of the COVID-19 declaration under Section 564(b)(1) of the Act, 21 U.S.C.section 360bbb-3(b)(1), unless the authorization is terminated  or revoked sooner.       Influenza A by PCR NEGATIVE NEGATIVE Final   Influenza B by PCR NEGATIVE NEGATIVE Final    Comment: (NOTE) The Xpert Xpress SARS-CoV-2/FLU/RSV plus assay is intended as an aid in the diagnosis of influenza from Nasopharyngeal swab specimens and should not be used as a sole basis for treatment. Nasal washings and aspirates are unacceptable for Xpert Xpress SARS-CoV-2/FLU/RSV testing.  Fact Sheet for Patients: EntrepreneurPulse.com.au  Fact Sheet for Healthcare Providers: IncredibleEmployment.be  This test is not yet approved or cleared by the [place] FDA and has been authorized for detection and/or diagnosis of SARS-CoV-2 by FDA under an Emergency Use Authorization (EUA). This EUA will remain in effect (meaning this test can be used) for the duration of the COVID-19 declaration under Section 564(b)(1) of the Act, 21 U.S.C. section 360bbb-3(b)(1), unless the authorization is terminated or revoked.  Performed at [facility], [address], [place], [place] [address]   MRSA Next Gen by PCR, Nasal     Status: None   Collection Time: 03/19/21  8:41 AM   Specimen: Nasal Mucosa; Nasal Swab  Result Value Ref Range Status   MRSA by PCR  Next Gen NOT DETECTED NOT DETECTED Final    Comment: (NOTE) The GeneXpert MRSA Assay (FDA approved for NASAL specimens only), is one component of a comprehensive MRSA colonization surveillance program. It is not  intended to diagnose MRSA infection nor to guide or monitor treatment for MRSA infections. Test performance is not FDA approved in patients less than 76 years old. Performed at [facility], [place] [address], [place], [place] [address]   Expectorated Sputum Assessment w Gram Stain, Rflx to Resp Cult     Status: None   Collection Time: 03/20/21  9:24 PM   Specimen: Sputum  Result Value Ref Range Status   Specimen Description SPUTUM  Final   Special Requests NONE  Final   Sputum evaluation   Final    THIS SPECIMEN IS ACCEPTABLE FOR SPUTUM CULTURE Performed at [facility], [place] [address] [place], [place] [address]    Report Status 03/21/2021 FINAL  Final  Culture, Respiratory w Gram Stain     Status: None   Collection Time: 03/20/21  9:24 PM   Specimen: SPU  Result Value Ref Range Status   Specimen Description SPUTUM  Final   Special Requests NONE Reflexed from [id number]  Final   Gram Stain   Final    FEW WBC PRESENT,BOTH PMN AND MONONUCLEAR FEW GRAM POSITIVE COCCI RARE GRAM NEGATIVE RODS    Culture   Final    RARE Normal respiratory flora-no Staph aureus or Pseudomonas seen Performed at [facility], [place] [address] [place], [place] [address]    Report Status 03/24/2021 FINAL  Final    Radiology Studies: DG Chest Port 1 View  Result Date: 03/27/2021 CLINICAL DATA:  Right upper lobectomy EXAM: PORTABLE CHEST 1 VIEW COMPARISON:  03/24/2021 FINDINGS: Postsurgical changes in the right upper lobe. Persistent right upper lobe pulmonary mass unchanged from 03/24/2021. No pleural effusion or pneumothorax. Stable cardiomediastinal silhouette. No aggressive osseous lesion. IMPRESSION: Postsurgical changes in the right upper lobe. Persistent right upper lobe  pulmonary mass unchanged from 03/24/2021. Electronically Signed   By: Kathreen Devoid   On: 03/27/2021 11:50      Ramirez Fullbright T. Eldred  If 7PM-7AM, please contact night-coverage [email or web address] 03/27/2021, 3:23 PM

## 2021-03-27 NOTE — Progress Notes (Signed)
Palliative Care Progress Note  Christopher Carter continues to struggle with cancer related pain located in his right upper chest, right shoulder/scapula and radiates up into his neck-positioning has been very difficult in radiation and he requires premedication to get through treatment. Yesterday he was transitioned to a fentanyl PCA, because of myoclonus and increased concerns for hydromorphone induced neurotoxicity and hyperalgesia. He is mental more clear today and the myoclonus has resolved completely. There were issues with his Fentanyl PCA overnight and delays in getting his syringe replaced that set him back with his pain control and created significant anxiety for him. He is still mostly confined to the bed due to pain and weakness and is becoming more deconditioned. He is coughing more today and there is frothy bloody sputum in his canister. He also sounds more congested and is wheezing, he additionally has a new O2 requirement.  Recommendations: Maintain Fentanyl PCA at current settings, will place 50 mcg Fentanyl patch this evening and reduce his basal by 50% tomorrow AM and the basal off by tomorrow afternoon.Will continue on demand bolus dosing and titrate his patch based on use of PCA. Maintain other adjuvant pain medications and steroid. Encourage mobility and OOB when pain is controlled. Given coughing and congestion have requested pulm evaluate him today. I am also concerned that he may have further disease progression given the ongoing severity of his pain or even bony involvement- suspect most of his pain is due to pressure on his lower thoracic outlet/brachial plexus from tumor and inflammation. Plan is to obtain repeat CT Scan. Medical Oncology asked to evaluate given mediastinal nodal involvement and rapid recurrence of malignancy following lobectomy. Plan is to complete radiation and then be considered for systemic treatment. His wife would like to know about whether or not there is a targeted  gene therapy for him and she does not want there to be any delays in initiating systemic treatment and would like to begin discussions about those recommendations and also explore coverage of recommended chemotherapies and immunotherapies while he is receiving radiation. She has excellent questions and is very motivated to get him the best care possible for his cancer to give him the best chance for recovery. Monitor for known unresolved dental infection-not currently on antibiotics-steroids may mask signs of infection or complicate a dental abscess. He probably needs this tooth extracted vs root canal. Could consult Dr. Teena Dunk who does dental medicine with oncology patients at [facility].  Will continue to follow closely. Discussed care with his wife by phone and will follow up in AM.  Time: 35 min Greater than 50%  of this time was spent counseling and coordinating care related to the above assessment and plan.  Lane Hacker, DO Palliative Medicine

## 2021-03-27 NOTE — Progress Notes (Signed)
Waste PCA Fentanyl  of 2.8 ml with Corky Sox, RN

## 2021-03-27 NOTE — Progress Notes (Signed)
Brief oncology note:  The patient's chart has been reviewed.  He is status post right upper lobe lobectomy for T4N0 adenocarcinoma.  The patient was admitted with hemoptysis.  He has been seen by radiation oncology and is currently undergoing a course of adjuvant radiation due to the chance of local failure.  Dr. Lisbeth Renshaw plans for a 6-1/2-week course of radiation.  I stopped by and visit with the patient.  I reviewed the recommendations from the multidisciplinary thoracic conference and plan from our standpoint is to consider him for systemic chemotherapy once radiation is completed.  We will arrange for outpatient follow-up once he is stable for discharge to further discuss our plans going forward.  Appreciate assistance from hospitalist and palliative team.  Please call medical oncology if questions arise.  Mikey Bussing, DNP, AGPCNP-BC, AOCNP

## 2021-03-27 NOTE — Progress Notes (Signed)
Nutrition Follow-up  DOCUMENTATION CODES:   Not applicable  INTERVENTION:  - continue Ensure Enlive BID. - will order 30 ml Prosource Plus BID, each supplement provides 100 kcal and 15 grams protein.   NUTRITION DIAGNOSIS:   Increased nutrient needs related to cancer and cancer related treatments as evidenced by estimated needs. -ongoing  GOAL:   Patient will meet greater than or equal to 90% of their needs -unmet at this time.  MONITOR:   PO intake, Supplement acceptance, Labs, Weight trends   ASSESSMENT:   Pt with a PMH significant for stage IIIa adenocarcinoma now s/p R upper lobectomy w/ node dissection 01/22/21 on 5L [place] at baseline, HTN, and depression who presented with recurrent hemoptysis. 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  Diet advanced from NPO to Regular on 7/11 at 1410 and since that time the only meal completions documented in the flow sheets was 100% of breakfast on 7/15 and 50% of breakfast yesterday.  He has been accepting Ensure 100% of the time offered.  Appetite has been improving throughout the past week. No discomforts with PO intakes.   Weight on 7/10 was documented as both 174 lb and 175 lb. Weight on 7/17 was 180 lb. No other weight recordings from this hospitalization. No information documented in the edema section of flow sheet.   Per notes: - stage 4 R lung adenocarcinoma s/p RUL lobectomy and plan for 6.5 week course of XRT; possible chemo after XRT completed - Palliative Care following for assistance with pain management regimen - tooth abscess with plan for outpatient follow-up - hx of type 2 DM with most recent HgbA1c: 6.5%     Labs reviewed; CBGs: 245 and 240 mg/dl, creatinine: 0.42 mg/dl.  Medications reviewed; 325 mg ferrous sulfate/day, sliding scale novolog, 60 mg solu-medrol QID, 1 tablet multivitamin with minerals/day, 40 mg oral protonix/day.   Diet Order:   Diet Order              Diet regular Room service appropriate? Yes; Fluid consistency: Thin  Diet effective now                   EDUCATION NEEDS:   No education needs have been identified at this time  Skin:  Skin Assessment: Reviewed RN Assessment  Last BM:  7/17 (?)  Height:   Ht Readings from Last 1 Encounters:  03/25/21 5\' 10"  (1.778 m)    Weight:   Wt Readings from Last 1 Encounters:  03/25/21 81.7 kg      Estimated Nutritional Needs:  Kcal:  2400-2600 Protein:  120-130g Fluid:  >2L     Jarome Matin, MS, RD, LDN, CNSC Inpatient Clinical Dietitian RD pager # available in AMION  After hours/weekend pager # available in [facility]

## 2021-03-27 NOTE — Progress Notes (Signed)
Pt on was on RA and RT placed Pt on 4l [place]. Pt uses 4L as needed at home

## 2021-03-27 NOTE — Progress Notes (Signed)
Palliative Care Progress Note  [name] is arriving back to his room from radiation treatment. He received fentanyl and versed prior to radiation so he is pain free at the moment and slightly groggy but does easily awake and reorient. His wife is present at bedside. We discussed his condition and challenges he has faced since diagnosis and following surgery. [name] is making very little progress over the past week in terms of regaining his functional status and improving his pain and dyspnea. He is now having myoclonus possibly opioid induced in the setting of high dose hydromorphone PCA. He is nearly sedated each time I have seen him in order to get relief. He is wheezing on my exam and having signs of dyspnea. He is diaphoretic and appears very flushed in his face.I reviewed his last CT done on 7/10 and discussed with his wife the scan findings and my concerns for disease progression especially with his worsening pain.   Switch PCA to Fentanyl PCA due to concern for opioid neurotoxicity or hyperalgesia. Ongoing complex pain management. Patient's wife needs more information about last CT scan 7/10 and the findings in the right upper lobe and mediastinum including information about cancer trajectory, systemic treatment options now that he has probable nodal involvement and also expectations for radiation. When this was first diagnosed it was Stage IIIa with no evidence of mets or nodal involvement but now he is Stage IV with latest CT showing airspace disease in the right lung representing alveolar hemorrhage with recurrence of apical fluid collection, masslike nodular present concerning for recurrence, and right peritracheal adenopathy. He has an unresolved dental infection- given his current situation -extraction of the tooth may be required vs.waiting on root canal-?dental consult/oral maxillofacial surgery? needs ongoing oral antibiotics to suppress infection.  Will continue to follow closely.  Lane Hacker, DO Palliative Medicine

## 2021-03-27 NOTE — Progress Notes (Signed)
NAME:  Christopher Carter, MRN:  [id number], DOB:  03-08-1961, LOS: 8 ADMISSION DATE:  03/18/2021, CONSULTATION DATE:  03/18/2021 REFERRING MD:  Dr. Tamala Julian, CHIEF COMPLAINT:  Hemoptysis    History of Present Illness:  Christopher Carter is a 60 y.o. 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 Patient has been treated at this facility for similar presentation and underwent video bronchoscopy and IR placed chest tube for pleural effusion with Dr. Roxan Hockey. He was discharged in stable condition with plans to follow up with cardiothoracic surgery and oncology.  He presented this admission with recurrent hemoptysis that started 4hr prior to admission with associated right upper back pain that radiates to upper neck. Patient was transferred from [place] to [facility] for cardiothoracic, pulmonary, and IR consults. Vital signs stable, WBC elevated at 18.1. CT chest was obtained and consistent with alveolar hemorrhage localized to the right with right apical fluid collection.   Pertinent  Medical History  Stage IIIa adenocarcinoma now S/P right upper lobectomy with node dissection 01/22/2021, on 5L [place] at baseline, tobacco abuse, HTN, and depression   Significant Hospital Events:  7/10 admitted with recurrent hemoptysis  7/11 bronch- nonfocal source, had bloody frothy secretions in all airways, petechiae on bronchi Receiving radiation therapy   Interim History / Subjective:  Still with significant pain and discomfort Informed about hemoptysis Persistent pain and discomfort  Objective   Blood pressure (!) 153/77, pulse (!) 108, temperature 98.3 F (36.8 C), temperature source Oral, resp. rate 19, height 5\' 10"  (1.778 m), weight 81.7 kg, SpO2 97 %.        Intake/Output Summary (Last 24 hours) at  03/27/2021 1421 Last data filed at 03/27/2021 0445 Gross per 24 hour  Intake 120 ml  Output 1750 ml  Net -1630 ml   Filed Weights   03/18/21 0240 03/18/21 0242 03/25/21 1542  Weight: 78.9 kg 79.4 kg 81.7 kg    Examination: HEENT: MM pink/moist Neuro: Awake and interactive CV: S1-S2 appreciated PULM: Decreased air entry bilaterally GI: soft, bsx4 active  GU: Fair output Extremities: warm/dry,  edema  Skin: no rashes or lesions  Labs reviewed -Leukocytosis 20.4, hematocrit of 29.2 platelet count of 467  CT scan of the chest 03/18/2021 reviewed by myself showing alveolar interstitial process  Resolved Hospital Problem list     Assessment & Plan:  Patient with acute on chronic hypoxic respiratory failure Hemoptysis -Hemoptysis appears to be a little worse today according to report Loculated pleural effusion s/p intervention Stage IV lung cancer-adenocarcinoma s/p right upper lobectomy  Patient with persistent chest pain and discomfort requiring escalating doses of opiates, Neurontin -Palliative care assisting with pain management  -Switched him to oral steroids but with increasing hemoptysis, will switch back to IV steroids  Will obtain a CT scan of the chest with contrast to assess postsurgical changes and possibly explain continuing significant/severe pain  Patient will be transferred to stepdown unit for closer monitoring  Discussed with Dr. Kara Dies care, Dr. Cyndia Skeeters hospitalist  Best Practice   Diet/type: Regular consistency (see orders) DVT prophylaxis: SCD GI prophylaxis: N/A Lines: N/A Foley:  N/A Code Status:  full code Last date of multidisciplinary goals of care discussion: Per primary   Labs   CBC: Recent Labs  Lab 03/21/21 0057 03/22/21 0212 03/23/21 0532 03/24/21 0224 03/25/21 0357 03/27/21  0749  WBC 18.9* 17.2* 23.3* 23.7* 21.5* 20.4*  NEUTROABS 15.1* 13.0* 20.3* 20.7* 18.0*  --   HGB 8.5* 8.5* 8.6* 8.1* 8.3* 9.2*  HCT 26.9* 25.9*  27.1* 25.2* 25.9* 29.2*  MCV 94.1 93.2 94.8 93.3 93.8 95.7  PLT 372 361 377 412* 416* 467*    Basic Metabolic Panel: Recent Labs  Lab 03/23/21 0532 03/24/21 0224 03/27/21 0749  NA 134*  --  139  K 4.1  --  4.1  CL 98  --  100  CO2 28  --  29  GLUCOSE 171*  --  238*  BUN 10  --  13  CREATININE 0.52*  --  0.42*  CALCIUM 9.2  --  9.0  MG  --  2.1 2.0  PHOS  --  3.2 2.9   GFR: Estimated Creatinine Clearance: 103.9 mL/min (A) (by C-G formula based on SCr of 0.42 mg/dL (L)). Recent Labs  Lab 03/23/21 0532 03/24/21 0224 03/25/21 0357 03/27/21 0749  WBC 23.3* 23.7* 21.5* 20.4*    Sherrilyn Rist, MD [facility] Pager: See Shea Evans

## 2021-03-27 NOTE — Progress Notes (Signed)
Fentanyl dose completed and verified with Corky Sox, RN

## 2021-03-28 ENCOUNTER — Ambulatory Visit
Admit: 2021-03-28 | Discharge: 2021-03-28 | Disposition: A | Discharge status: Home / Self Care | Payer: 59 | Attending: Radiation Oncology | Admitting: Radiation Oncology

## 2021-03-28 ENCOUNTER — Encounter (HOSPITAL_COMMUNITY): Payer: Self-pay | Admitting: Internal Medicine

## 2021-03-28 DIAGNOSIS — D72829 Elevated white blood cell count, unspecified: Secondary | ICD-10-CM | POA: Diagnosis not present

## 2021-03-28 DIAGNOSIS — R0489 Hemorrhage from other sites in respiratory passages: Secondary | ICD-10-CM | POA: Diagnosis not present

## 2021-03-28 DIAGNOSIS — J9621 Acute and chronic respiratory failure with hypoxia: Secondary | ICD-10-CM | POA: Diagnosis not present

## 2021-03-28 DIAGNOSIS — I10 Essential (primary) hypertension: Secondary | ICD-10-CM | POA: Diagnosis not present

## 2021-03-28 LAB — RENAL FUNCTION PANEL
Albumin: 2.6 g/dL — ABNORMAL LOW (ref 3.5–5.0)
Anion gap: 10 (ref 5–15)
BUN: 13 mg/dL (ref 6–20)
CO2: 29 mmol/L (ref 22–32)
Calcium: 8.9 mg/dL (ref 8.9–10.3)
Chloride: 99 mmol/L (ref 98–111)
Creatinine, Ser: 0.4 mg/dL — ABNORMAL LOW (ref 0.61–1.24)
GFR, Estimated: >60 mL/min (ref >60)
Glucose, Bld: 288 mg/dL — ABNORMAL HIGH (ref 70–99)
Phosphorus: 2.7 mg/dL (ref 2.5–4.6)
Potassium: 4.4 mmol/L (ref 3.5–5.1)
Sodium: 138 mmol/L (ref 135–145)

## 2021-03-28 LAB — GLUCOSE, CAPILLARY
Glucose-Capillary: 298 mg/dL — ABNORMAL HIGH (ref 70–99)
Glucose-Capillary: 395 mg/dL — ABNORMAL HIGH (ref 70–99)
Glucose-Capillary: 282 mg/dL — ABNORMAL HIGH (ref 70–99)
Glucose-Capillary: 221 mg/dL — ABNORMAL HIGH (ref 70–99)

## 2021-03-28 LAB — CBC
HCT: 28.6 % — ABNORMAL LOW (ref 39.0–52.0)
Hemoglobin: 9 g/dL — ABNORMAL LOW (ref 13.0–17.0)
MCH: 30 pg (ref 26.0–34.0)
MCHC: 31.5 g/dL (ref 30.0–36.0)
MCV: 95.3 fL (ref 80.0–100.0)
Platelets: 467 10*3/uL — ABNORMAL HIGH (ref 150–400)
RBC: 3 MIL/uL — ABNORMAL LOW (ref 4.22–5.81)
RDW: 15.3 % (ref 11.5–15.5)
WBC: 18.7 10*3/uL — ABNORMAL HIGH (ref 4.0–10.5)
nRBC: 0.1 % (ref 0.0–0.2)

## 2021-03-28 LAB — MAGNESIUM: Magnesium: 2 mg/dL (ref 1.7–2.4)

## 2021-03-28 MED ORDER — PREDNISONE 20 MG PO TABS
20.0000 mg | ORAL_TABLET | Freq: Two times a day (BID) | ORAL | Status: AC
  Administered 2021-03-28 – 2021-03-30 (×5): 20 mg via ORAL
  Filled 2021-03-28 (×5): qty 1

## 2021-03-28 MED ORDER — INSULIN GLARGINE 100 UNIT/ML ~~LOC~~ SOLN
5.0000 [IU] | Freq: Two times a day (BID) | SUBCUTANEOUS | Status: DC
  Administered 2021-03-28: 5 [IU] via SUBCUTANEOUS
  Filled 2021-03-28 (×2): qty 0.05

## 2021-03-28 MED ORDER — INSULIN GLARGINE 100 UNIT/ML ~~LOC~~ SOLN
10.0000 [IU] | Freq: Two times a day (BID) | SUBCUTANEOUS | Status: DC
  Administered 2021-03-28: 10 [IU] via SUBCUTANEOUS
  Filled 2021-03-28 (×2): qty 0.1

## 2021-03-28 MED ORDER — INSULIN ASPART 100 UNIT/ML IJ SOLN
0.0000 [IU] | Freq: Three times a day (TID) | INTRAMUSCULAR | Status: DC
  Administered 2021-03-28: 20 [IU] via SUBCUTANEOUS
  Administered 2021-03-28 (×2): 11 [IU] via SUBCUTANEOUS
  Administered 2021-03-29: 20 [IU] via SUBCUTANEOUS
  Administered 2021-03-29: 3 [IU] via SUBCUTANEOUS
  Administered 2021-03-29: 15 [IU] via SUBCUTANEOUS
  Administered 2021-03-30: 11 [IU] via SUBCUTANEOUS
  Administered 2021-03-30: 15 [IU] via SUBCUTANEOUS
  Administered 2021-03-30: 7 [IU] via SUBCUTANEOUS
  Administered 2021-03-31: 3 [IU] via SUBCUTANEOUS
  Administered 2021-03-31: 7 [IU] via SUBCUTANEOUS

## 2021-03-28 MED ORDER — INSULIN ASPART 100 UNIT/ML IJ SOLN
0.0000 [IU] | Freq: Every day | INTRAMUSCULAR | Status: DC
Start: 2021-03-28 — End: 2021-03-31
  Administered 2021-03-28: 2 [IU] via SUBCUTANEOUS
  Administered 2021-03-29 – 2021-03-30 (×2): 3 [IU] via SUBCUTANEOUS

## 2021-03-28 MED ORDER — INSULIN ASPART 100 UNIT/ML IJ SOLN
8.0000 [IU] | Freq: Three times a day (TID) | INTRAMUSCULAR | Status: DC
  Administered 2021-03-28: 8 [IU] via SUBCUTANEOUS

## 2021-03-28 MED ORDER — INSULIN ASPART 100 UNIT/ML IJ SOLN
4.0000 [IU] | Freq: Three times a day (TID) | INTRAMUSCULAR | Status: DC
  Administered 2021-03-28 (×2): 4 [IU] via SUBCUTANEOUS

## 2021-03-28 NOTE — Progress Notes (Signed)
PROGRESS NOTE  Christopher Carter EUM:[id number] DOB: 1961/05/06   PCP: [name], MD  Patient is from: Home  DOA: 03/18/2021 LOS: 9  Chief complaints:  Chief Complaint  Patient presents with   Hemoptysis   Back Pain     Brief Narrative / Interim history: 60 year old M with PMH of stage IV adenocarcinoma s/p RUL lobectomy with node dissection on 5/16, recurrent hemoptysis, chronic RF on 5 L, tobacco abuse, HTN, depression, loculated hemithorax treated with IR chest tube placement, empiric antibiotics and prednisone taper and discharged to follow-up with CTS and radiation oncology.  He returned with cough with hemoptysis, right upper back pain, and increased oxygen requirement.  CT chest showed airspace disease in right lung representing alveolar hemorrhage with recurrence of apical fluid collection, nodular masslike lesion concerning for recurrence of malignancy and right peritracheal adenopathy.  He was admitted to [facility].  CTS, pulmonary IR and palliative consulted.  Started on IV cefepime, systemic steroid and tranexamic acid nebulization.  He underwent bronchoscopy that did not reveal active bleeding.  Hemoptysis seems to have resolved.  Started on PCA Dilaudid by PMT for pain control, and transferred to [facility] for radiation oncology care.  Main issue is pain control.   Subjective: Seen and examined earlier this morning.  No major events overnight of this morning.  Reports having a decent night.  Pain improved.  He rates his pain 6/10 this morning.  No significant cough or hemoptysis.  No shortness of breath.  Fentanyl seems to be working without making him sleepy.  His main concern is about pain control when he goes home.  Objective: Vitals:   03/28/21 1025 03/28/21 1213 03/28/21 1415 03/28/21 1418  BP:   121/65   Pulse:   89   Resp: 20  20   Temp:   97.8 F (36.6 C)   TempSrc:   Oral   SpO2: 94% 98% 100% 95%  Weight:      Height:        Intake/Output Summary (Last  24 hours) at 03/28/2021 1620 Last data filed at 03/28/2021 1458 Gross per 24 hour  Intake 740 ml  Output 2200 ml  Net -1460 ml   Filed Weights   03/18/21 0240 03/18/21 0242 03/25/21 1542  Weight: 78.9 kg 79.4 kg 81.7 kg    Examination:  GENERAL: No apparent distress.  Nontoxic. HEENT: MMM.  Vision and hearing grossly intact.  NECK: Supple.  No apparent JVD.  RESP: On 4 L.  No IWOB.  Fair aeration bilaterally. CVS:  RRR. Heart sounds normal.  ABD/GI/GU: BS+. Abd soft, NTND.  MSK/EXT:  Moves extremities. No apparent deformity. No edema.  SKIN: no apparent skin lesion or wound NEURO: Awake and alert. Oriented appropriately.  No apparent focal neuro deficit. PSYCH: Calm. Normal affect.   Procedures:  7/11-bronchoscopy  Microbiology summarized: [id number] and influenza PCR nonreactive. MRSA PCR screen negative. 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 s/p RUL lobectomy and node dissection by Dr. Koleen Nimrod on 5/16 Acute on chronic respiratory failure with hypoxia-on 4 L at baseline. -7/10-CTA chest concerning for alveolar hemorrhage, loculated fluid at right lung apex lined by masslike nodularity consistent with recurrence of malignancy and right peritracheal LAD -7/19-repeat CTA chest without significant change, may be improved hemorrhage -Respiratory culture with normal respiratory flora -Bronchoscopy on 7/11-no active hemorrhage -Hemoptysis improved/nearly resolved. -  Already completed 7 days of IV cefepime on 7/18 -Rad/onc-started radiation on 7/13>>> reportedly planned for 6 and half week -Pain improved with fentanyl patch and fentanyl PCA-palliative medicine managing. -Appreciate help by PCCM-weaning steroid. -Oncology to arrange outpatient follow-up for systemic chemotherapy after radiation therapy     Uncontrolled cancer-related pain: Rates his pain 6/10 today -Improved with fentanyl patch, injection and PCA -Also on Celebrex and gabapentin. -On scheduled and as needed Valium for anxiety -As needed Narcan   Leukocytosis: Likely demargination from steroid.  Improved -Continue monitoring   Tooth abscess: Was on penicillin V outpatient.  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 Recent Labs    03/18/21 2208 03/19/21 0215 03/20/21 0407 03/21/21 0057 03/22/21 0212 03/23/21 0532 03/24/21 0224 03/25/21 0357 03/27/21 0749 03/28/21 0335  HGB 9.8* 9.4* 8.8* 8.5* 8.5* 8.6* 8.1* 8.3* 9.2* 9.0*  -Monitor H&H    Essential hypertension: Normotensive. -Continue home amlodipine, irbesartan.  Controlled DM-2 with hyperglycemia: A1c 6.5%.  Hyperglycemia likely due to steroid.  Not on meds at home Recent Labs  Lab 03/27/21 1138 03/27/21 1619 03/27/21 2122 03/28/21 0726 03/28/21 1140  GLUCAP 240* 234* 356* 298* 395*  -Increase SSI to high. -Lantus 10 units twice daily starting tonight -NovoLog 8 units 3 times daily with meals starting this evening -Further adjustment as appropriate. -Wean from steroid as able    History of anxiety/depression:  -On Seroquel, Zoloft and Valium   GERD: -On  Protonix     Body mass index is 25.84 kg/m. Nutrition Problem: Increased nutrient needs Etiology: cancer and cancer related treatments Signs/Symptoms: estimated needs Interventions: Ensure Enlive (each supplement provides 350kcal and 20 grams of protein), MVI   DVT prophylaxis:  SCDs Start: 03/18/21 1527  Code Status: Full code Family Communication: Patient and/or RN. None at bedside today Level of care: Progressive Status is: Inpatient  Remains inpatient appropriate because:Ongoing active pain requiring inpatient pain management, IV treatments appropriate due to intensity of  illness or inability to take PO, and Inpatient level of care appropriate due to severity of illness  Dispo: The patient is from: Home              Anticipated d/c is to: Home              Patient currently is not medically stable to d/c.   Difficult to place patient No       Consultants:  PCCM Cardiothoracic surgery-signed off IR-signed off Palliative medicine Oncology Radiation oncology   Sch Meds:  Scheduled Meds:  (feeding supplement) PROSource Plus  30 mL Oral BID BM   albuterol  2.5 mg Nebulization Q6H   amLODipine  10 mg Oral Daily   benzonatate  200 mg Oral BID   celecoxib  200 mg Oral BID   diazepam  2 mg Oral Q6H   feeding supplement  237 mL Oral BID BM   fentaNYL  1 patch Transdermal Q72H   fentaNYL   Intravenous Q4H   ferrous sulfate  325 mg Oral Q breakfast   gabapentin  300 mg Oral BID WC   gabapentin  600 mg Oral QHS   guaiFENesin  600 mg Oral BID   insulin aspart  0-20 Units Subcutaneous TID WC   insulin aspart  0-5 Units Subcutaneous QHS   insulin aspart  4 Units Subcutaneous TID WC   insulin glargine  5 Units Subcutaneous BID   irbesartan  300 mg Oral Daily   latanoprost  1 drop Both Eyes QHS   lidocaine  1 patch Transdermal Q24H   multivitamin with minerals  1 tablet Oral Daily   pantoprazole  40 mg Oral Daily   predniSONE  20 mg Oral BID WC   QUEtiapine  100 mg Oral QHS   sertraline  100 mg Oral Daily   sodium chloride flush  3 mL Intravenous Q12H   Continuous Infusions:  tranexamic acid     PRN Meds:.acetaminophen, albuterol, diazepam, diphenhydrAMINE **OR** diphenhydrAMINE, fentaNYL (SUBLIMAZE) injection, fentaNYL (SUBLIMAZE) injection, naloxone **AND** sodium chloride flush, ondansetron **OR** ondansetron (ZOFRAN) IV, ondansetron (ZOFRAN) IV, polyethylene glycol  Antimicrobials: Anti-infectives (From admission, onward)    Start     Dose/Rate Route Frequency Ordered Stop   03/19/21 0900  vancomycin (VANCOREADY) IVPB 1250 mg/250 mL   Status:  Discontinued        1,250 mg 166.7 mL/hr over 90 Minutes Intravenous Every 12 hours 03/18/21 1842 03/20/21 1059   03/18/21 2200  ceFEPIme (MAXIPIME) 2 g in sodium chloride 0.9 % 100 mL IVPB        2 g 200 mL/hr over 30 Minutes Intravenous Every 8 hours 03/18/21 1836 03/26/21 0835   03/18/21 1930  vancomycin (VANCOREADY) IVPB 1500 mg/300 mL        1,500 mg 150 mL/hr over 120 Minutes Intravenous  Once 03/18/21 1836 03/19/21 0734   03/18/21 1915  azithromycin (ZITHROMAX) 500 mg in sodium chloride 0.9 % 250 mL IVPB  Status:  Discontinued        500 mg 250 mL/hr over 60 Minutes Intravenous Every 24 hours 03/18/21 1822 03/19/21 0840   03/18/21 1800  penicillin v potassium (VEETID) tablet 500 mg  Status:  Discontinued       Note to Pharmacy: Start date : 03/14/21     500 mg Oral 4 times daily 03/18/21 1527 03/18/21 1842        I have personally reviewed the following labs and images: CBC: Recent Labs  Lab 03/22/21 0212 03/23/21 0532 03/24/21 0224 03/25/21 0357 03/27/21 0749 03/28/21 0335  WBC 17.2* 23.3* 23.7* 21.5* 20.4* 18.7*  NEUTROABS 13.0* 20.3* 20.7* 18.0*  --   --   HGB 8.5* 8.6* 8.1* 8.3* 9.2* 9.0*  HCT 25.9* 27.1* 25.2* 25.9* 29.2* 28.6*  MCV 93.2 94.8 93.3 93.8 95.7 95.3  PLT 361 377 412* 416* 467* 467*   BMP &GFR Recent Labs  Lab 03/23/21 0532 03/24/21 0224 03/27/21 0749 03/28/21 0335  NA 134*  --  139 138  K 4.1  --  4.1 4.4  CL 98  --  100 99  CO2 28  --  29 29  GLUCOSE 171*  --  238* 288*  BUN 10  --  13 13  CREATININE 0.52*  --  0.42* 0.40*  CALCIUM 9.2  --  9.0 8.9  MG  --  2.1 2.0 2.0  PHOS  --  3.2 2.9 2.7   Estimated Creatinine Clearance: 103.9 mL/min (A) (by C-G formula based on SCr of 0.4 mg/dL (L)). Liver & Pancreas: Recent Labs  Lab 03/27/21 0749 03/28/21 0335  ALBUMIN 2.6* 2.6*   No results for input(s): LIPASE, AMYLASE in the last 168 hours. No results for input(s): AMMONIA in the last 168 hours. Diabetic: No results for  input(s): HGBA1C in the last 72 hours. Recent Labs  Lab 03/27/21 1138 03/27/21 1619 03/27/21 2122 03/28/21 0726 03/28/21 1140  GLUCAP 240* 234* 356* 298* 395*   Cardiac  Enzymes: No results for input(s): CKTOTAL, CKMB, CKMBINDEX, TROPONINI in the last 168 hours. No results for input(s): PROBNP in the last 8760 hours. Coagulation Profile: No results for input(s): INR, PROTIME in the last 168 hours. Thyroid Function Tests: No results for input(s): TSH, T4TOTAL, FREET4, T3FREE, THYROIDAB in the last 72 hours. Lipid Profile: No results for input(s): CHOL, HDL, LDLCALC, TRIG, CHOLHDL, LDLDIRECT in the last 72 hours. Anemia Panel: No results for input(s): VITAMINB12, FOLATE, FERRITIN, TIBC, IRON, RETICCTPCT in the last 72 hours. Urine analysis:    Component Value Date/Time   COLORURINE YELLOW 01/18/2021 1500   APPEARANCEUR CLEAR 01/18/2021 1500   LABSPEC 1.006 01/18/2021 1500   PHURINE 6.0 01/18/2021 1500   GLUCOSEU NEGATIVE 01/18/2021 1500   HGBUR SMALL (A) 01/18/2021 1500   BILIRUBINUR NEGATIVE 01/18/2021 1500   KETONESUR NEGATIVE 01/18/2021 1500   PROTEINUR NEGATIVE 01/18/2021 1500   NITRITE NEGATIVE 01/18/2021 1500   LEUKOCYTESUR NEGATIVE 01/18/2021 1500   Sepsis Labs: Invalid input(s): PROCALCITONIN, [place]  Microbiology: Recent Results (from the past 240 hour(s))  MRSA Next Gen by PCR, Nasal     Status: None   Collection Time: 03/19/21  8:41 AM   Specimen: Nasal Mucosa; Nasal Swab  Result Value Ref Range Status   MRSA by PCR Next Gen NOT DETECTED NOT DETECTED Final    Comment: (NOTE) The GeneXpert MRSA Assay (FDA approved for NASAL specimens only), is one component of a comprehensive MRSA colonization surveillance program. It is not intended to diagnose MRSA infection nor to guide or monitor treatment for MRSA infections. Test performance is not FDA approved in patients less than 61 years old. Performed at [facility], [place] [address] [place],  [place] [address]   Expectorated Sputum Assessment w Gram Stain, Rflx to Resp Cult     Status: None   Collection Time: 03/20/21  9:24 PM   Specimen: Sputum  Result Value Ref Range Status   Specimen Description SPUTUM  Final   Special Requests NONE  Final   Sputum evaluation   Final    THIS SPECIMEN IS ACCEPTABLE FOR SPUTUM CULTURE Performed at [facility], [place] [address], [place], [place] [address]    Report Status 03/21/2021 FINAL  Final  Culture, Respiratory w Gram Stain     Status: None   Collection Time: 03/20/21  9:24 PM   Specimen: SPU  Result Value Ref Range Status   Specimen Description SPUTUM  Final   Special Requests NONE Reflexed from [id number]  Final   Gram Stain   Final    FEW WBC PRESENT,BOTH PMN AND MONONUCLEAR FEW GRAM POSITIVE COCCI RARE GRAM NEGATIVE RODS    Culture   Final    RARE Normal respiratory flora-no Staph aureus or Pseudomonas seen Performed at [facility], [place] [address] [place], [place] [address]    Report Status 03/24/2021 FINAL  Final    Radiology Studies: CT CHEST W CONTRAST  Result Date: 03/27/2021 CLINICAL DATA:  Persistent chest pain. History of right upper lobectomy and recent CT suspicious for recurrence. EXAM: CT CHEST WITH CONTRAST TECHNIQUE: Multidetector CT imaging of the chest was performed during intravenous contrast administration. CONTRAST:  21mL OMNIPAQUE IOHEXOL 350 MG/ML SOLN COMPARISON:  Multiple recent radiographs.  Chest CT 9 days ago. FINDINGS: Cardiovascular: Normal caliber thoracic aorta. Mild atherosclerosis. Coronary artery calcifications. Normal heart size. No pericardial effusion. No obvious pulmonary embolus on this non angiographic exam. Mediastinum/Nodes: There are enlarged right lower paratracheal nodes measuring 16 and  17 mm, unchanged allowing for differences in caliper placement. Additional smaller mediastinal lymph nodes are similar. No thyroid nodule. No esophageal wall thickening. Lungs/Pleura: History of right  upper lobectomy. Loculated collection at the right lung apex lined by thick masslike nodularity. This is not significantly changed in the interim. This again measures at least 10 cm ostial dimension. There scattered calcifications. There is surrounding ground-glass opacity extending into the adjacent right lung with improvement from prior exam. Small right pleural effusion is similar there is a posterior pleural based nodule in the right hemithorax measuring 17 mm, series 2, image 46. Upper Abdomen: No acute findings. Musculoskeletal: Postsurgical change of right posterior upper ribs. There is no frank bony destruction related to right apical masslike opacity. No discrete osseous lesions. No definite chest wall extension of apical lesion. IMPRESSION: 1. No significant change in loculated collection at the right lung apex lined by thick masslike nodularity, suspicious for malignancy recurrence. 2. Mild improvement in surrounding ground-glass opacity in the adjacent right lung, likely representing improving hemorrhage. 3. Unchanged small right pleural effusion. Unchanged pleural based nodule in the posterior right hemithorax. 4. Enlarged right lower paratracheal lymph nodes are similar to recent prior. 5. Coronary artery calcifications. Aortic Atherosclerosis (ICD10-I70.0). Electronically Signed   By: Keith Rake M.D.   On: 03/27/2021 19:58      Sarenity Ramaker T. [place]  If 7PM-7AM, please contact night-coverage [email or web address] 03/28/2021, 4:20 PM

## 2021-03-28 NOTE — Progress Notes (Signed)
NAME:  Christopher Carter, MRN:  [id number], DOB:  1961/08/25, LOS: 9 ADMISSION DATE:  03/18/2021, CONSULTATION DATE:  03/18/2021 REFERRING MD:  Dr. Tamala Julian, CHIEF COMPLAINT:  Hemoptysis    History of Present Illness:  Christopher Carter is a 60 y.o. 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 Patient has been treated at this facility for similar presentation and underwent video bronchoscopy and IR placed chest tube for pleural effusion with Dr. Roxan Hockey. He was discharged in stable condition with plans to follow up with cardiothoracic surgery and oncology.  He presented this admission with recurrent hemoptysis that started 4hr prior to admission with associated right upper back pain that radiates to upper neck. Patient was transferred from [place] to [facility] for cardiothoracic, pulmonary, and IR consults. Vital signs stable, WBC elevated at 18.1. CT chest was obtained and consistent with alveolar hemorrhage localized to the right with right apical fluid collection.   Pertinent  Medical History  Stage IIIa adenocarcinoma now S/P right upper lobectomy with node dissection 01/22/2021, on 5L [place] at baseline, tobacco abuse, HTN, and depression   Significant Hospital Events:  7/10 admitted with recurrent hemoptysis  7/11 bronch- nonfocal source, had bloody frothy secretions in all airways, petechiae on bronchi Receiving radiation therapy   Interim History / Subjective:  Pain and discomfort is a little bit better today No hemoptysis today  Objective   Blood pressure (!) 154/75, pulse 88, temperature 98 F (36.7 C), temperature source Oral, resp. rate 20, height 5\' 10"  (1.778 m), weight 81.7 kg, SpO2 94 %.    FiO2 (%):  [96 %] 96 %   Intake/Output Summary (Last 24 hours) at 03/28/2021 1111 Last  data filed at 03/28/2021 8938 Gross per 24 hour  Intake 500 ml  Output 1650 ml  Net -1150 ml   Filed Weights   03/18/21 0240 03/18/21 0242 03/25/21 1542  Weight: 78.9 kg 79.4 kg 81.7 kg    Examination: HEENT: Moist oral mucosa Neuro: Awake and interactive CV: S1-S2 appreciated PULM: Decreased air entry bilaterally GI: Bowel sounds appreciated GU: Fair output Extremities: warm/dry,  edema  Skin: no rashes or lesions  Labs reviewed -Leukocytosis 18.7, hematocrit of 28.6 platelet count of 467  CT scan of the chest 03/18/2021 reviewed by myself showing alveolar interstitial process  Repeat CT scan of the chest is stable 03/27/2021 Resolved Hospital Problem list     Assessment & Plan:  Patient with acute on chronic hypoxemic respiratory failure Hemoptysis -Hemoptysis was worse 03/27/2021 -Was transitioned to IV steroids, with CT scan of the chest unchanged from previous, will resume p.o. steroids -There is no worsening alveolitis noted  Stage IV lung cancer-adenocarcinoma s/p right upper lobectomy  Persistent pain and discomfort being treated with opiates, Neurontin -A little bit better  Leukocytosis appears to be improving  Switch to prednisone 20 p.o. twice daily for 5 days, may be decreased to 20 daily for 5 more days and stopped-10 days in total  Discussed with Dr. Cyndia Skeeters  Best Practice   Diet/type: Regular consistency (see orders) DVT prophylaxis: SCD GI prophylaxis: N/A Lines: N/A Foley:  N/A Code Status:  full code Last date of multidisciplinary goals of care discussion: Per primary   Labs   CBC: Recent Labs  Lab 03/22/21 0212 03/23/21 0532 03/24/21 0224 03/25/21 0357 03/27/21 0749 03/28/21 0335  WBC 17.2* 23.3* 23.7* 21.5* 20.4* 18.7*  NEUTROABS 13.0* 20.3* 20.7* 18.0*  --   --   HGB 8.5* 8.6* 8.1* 8.3* 9.2* 9.0*  HCT 25.9* 27.1* 25.2* 25.9* 29.2* 28.6*  MCV 93.2 94.8 93.3 93.8 95.7 95.3  PLT 361 377 412* 416* 467* 467*    Basic Metabolic  Panel: Recent Labs  Lab 03/23/21 0532 03/24/21 0224 03/27/21 0749 03/28/21 0335  NA 134*  --  139 138  K 4.1  --  4.1 4.4  CL 98  --  100 99  CO2 28  --  29 29  GLUCOSE 171*  --  238* 288*  BUN 10  --  13 13  CREATININE 0.52*  --  0.42* 0.40*  CALCIUM 9.2  --  9.0 8.9  MG  --  2.1 2.0 2.0  PHOS  --  3.2 2.9 2.7   GFR: Estimated Creatinine Clearance: 103.9 mL/min (A) (by C-G formula based on SCr of 0.4 mg/dL (L)). Recent Labs  Lab 03/24/21 0224 03/25/21 0357 03/27/21 0749 03/28/21 0335  WBC 23.7* 21.5* 20.4* 18.7*    Sherrilyn Rist, MD [place] [facility] Pager: See Shea Evans

## 2021-03-29 ENCOUNTER — Ambulatory Visit
Admit: 2021-03-29 | Discharge: 2021-03-29 | Disposition: A | Discharge status: Home / Self Care | Payer: 59 | Attending: Radiation Oncology | Admitting: Radiation Oncology

## 2021-03-29 DIAGNOSIS — D72829 Elevated white blood cell count, unspecified: Secondary | ICD-10-CM | POA: Diagnosis not present

## 2021-03-29 DIAGNOSIS — R0489 Hemorrhage from other sites in respiratory passages: Secondary | ICD-10-CM | POA: Diagnosis not present

## 2021-03-29 DIAGNOSIS — I10 Essential (primary) hypertension: Secondary | ICD-10-CM | POA: Diagnosis not present

## 2021-03-29 DIAGNOSIS — J9621 Acute and chronic respiratory failure with hypoxia: Secondary | ICD-10-CM | POA: Diagnosis not present

## 2021-03-29 LAB — GLUCOSE, CAPILLARY
Glucose-Capillary: 318 mg/dL — ABNORMAL HIGH (ref 70–99)
Glucose-Capillary: 358 mg/dL — ABNORMAL HIGH (ref 70–99)
Glucose-Capillary: 121 mg/dL — ABNORMAL HIGH (ref 70–99)
Glucose-Capillary: 255 mg/dL — ABNORMAL HIGH (ref 70–99)

## 2021-03-29 MED ORDER — FENTANYL 75 MCG/HR TD PT72
1.0000 | MEDICATED_PATCH | TRANSDERMAL | Status: DC
  Administered 2021-03-29: 1 via TRANSDERMAL
  Filled 2021-03-29 (×2): qty 1

## 2021-03-29 MED ORDER — INSULIN ASPART 100 UNIT/ML IJ SOLN
12.0000 [IU] | Freq: Three times a day (TID) | INTRAMUSCULAR | Status: DC
  Administered 2021-03-29 – 2021-03-31 (×5): 12 [IU] via SUBCUTANEOUS

## 2021-03-29 MED ORDER — INSULIN GLARGINE 100 UNIT/ML ~~LOC~~ SOLN
20.0000 [IU] | Freq: Two times a day (BID) | SUBCUTANEOUS | Status: DC
  Administered 2021-03-29 – 2021-03-30 (×3): 20 [IU] via SUBCUTANEOUS
  Filled 2021-03-29 (×3): qty 0.2

## 2021-03-29 MED ORDER — FENTANYL 25 MCG/HR TD PT72: 1.0000 | MEDICATED_PATCH | TRANSDERMAL | Status: DC

## 2021-03-29 MED ORDER — FENTANYL 50 MCG/ML IV PCA SOLN
INTRAVENOUS | Status: DC
  Administered 2021-03-29: 483.6 ug via INTRAVENOUS
  Administered 2021-03-29: 100 ug via INTRAVENOUS
  Administered 2021-03-30: 229.5 ug via INTRAVENOUS
  Administered 2021-03-30: 300 ug via INTRAVENOUS
  Administered 2021-03-30: 250 ug via INTRAVENOUS
  Administered 2021-03-30: 100 ug via INTRAVENOUS
  Administered 2021-03-31: 200 ug via INTRAVENOUS
  Administered 2021-03-31: 150 ug via INTRAVENOUS
  Administered 2021-03-31: 100 ug via INTRAVENOUS
  Filled 2021-03-29 (×3): qty 20

## 2021-03-29 MED ORDER — AZITHROMYCIN 250 MG PO TABS
250.0000 mg | ORAL_TABLET | Freq: Every day | ORAL | Status: DC
  Administered 2021-03-29 – 2021-04-03 (×6): 250 mg via ORAL
  Filled 2021-03-29 (×6): qty 1

## 2021-03-29 MED ORDER — INSULIN ASPART 100 UNIT/ML IJ SOLN
10.0000 [IU] | Freq: Three times a day (TID) | INTRAMUSCULAR | Status: DC
  Administered 2021-03-29 (×2): 10 [IU] via SUBCUTANEOUS

## 2021-03-29 MED ORDER — DIAZEPAM 2 MG PO TABS
2.0000 mg | ORAL_TABLET | Freq: Three times a day (TID) | ORAL | Status: DC
  Administered 2021-03-29 – 2021-04-01 (×9): 2 mg via ORAL
  Filled 2021-03-29 (×9): qty 1

## 2021-03-29 NOTE — Progress Notes (Signed)
[name] is considerably better today. He is OOB and ambulated to bathroom mostly independently. Started on Fentanyl patch last PM-still receiving large amount of breakthrough fentanyl -will continue his basal for now and increase his patch tomorrow. We discussed pain control needs in preparation for discharge home-will need to get him off PCA- will reassess his requirements tomorrow but in general we are making progress with this. Updated his wife wife by phone-will plan on meeting them both tomorrow AM.  Lane Hacker, DO Palliative Medicine

## 2021-03-29 NOTE — Progress Notes (Signed)
PROGRESS NOTE  Christopher Carter HKV:[id number] DOB: 09-20-1960   PCP: [name], MD  Patient is from: Home  DOA: 03/18/2021 LOS: 58  Chief complaints:  Chief Complaint  Patient presents with   Hemoptysis   Back Pain     Brief Narrative / Interim history: 60 year old M with PMH of stage IV adenocarcinoma s/p RUL lobectomy with node dissection on 5/16, recurrent hemoptysis, chronic RF on 5 L, tobacco abuse, HTN, depression, loculated hemithorax treated with IR chest tube placement, empiric antibiotics and prednisone taper and discharged to follow-up with CTS and radiation oncology.  He returned with cough with hemoptysis, right upper back pain, and increased oxygen requirement.  CT chest showed airspace disease in right lung representing alveolar hemorrhage with recurrence of apical fluid collection, nodular masslike lesion concerning for recurrence of malignancy and right peritracheal adenopathy.  He was admitted to [facility].  CTS, pulmonary IR and palliative consulted.  Started on IV cefepime, systemic steroid and tranexamic acid nebulization.  He underwent bronchoscopy that did not reveal active bleeding.  Hemoptysis seems to have resolved.  Started on PCA Dilaudid by PMT for pain control, and transferred to [facility] for radiation oncology care.  Main issue is pain control.   Subjective: Seen and examined earlier this morning.  No major events overnight of this morning.  He had some cough this morning.  He reports some blood with phlegm.  I noted very slightly blood-tinged phlegm.  Pain is fairly controlled.  Denies shortness of breath.  Objective: Vitals:   03/29/21 0516 03/29/21 0601 03/29/21 0736 03/29/21 0818  BP:  (!) 141/80    Pulse:  (!) 101    Resp: 18 16 20    Temp:  98.7 F (37.1 C)    TempSrc:  Oral    SpO2: 98% 99% 95% 96%  Weight:      Height:        Intake/Output Summary (Last 24 hours) at 03/29/2021 1457 Last data filed at 03/29/2021 0819 Gross per 24 hour   Intake 481 ml  Output 850 ml  Net -369 ml   Filed Weights   03/18/21 0240 03/18/21 0242 03/25/21 1542  Weight: 78.9 kg 79.4 kg 81.7 kg    Examination:  GENERAL: No apparent distress.  Nontoxic. HEENT: MMM.  Vision and hearing grossly intact.  NECK: Supple.  No apparent JVD.  RESP: On 4 L.  No IWOB.  Fair aeration bilaterally. CVS:  RRR. Heart sounds normal.  ABD/GI/GU: BS+. Abd soft, NTND.  MSK/EXT:  Moves extremities. No apparent deformity. No edema.  SKIN: no apparent skin lesion or wound NEURO: Awake and alert. Oriented appropriately.  No apparent focal neuro deficit. PSYCH: Calm. Normal affect.   Procedures:  7/11-bronchoscopy  Microbiology summarized: FIEPP-29 and influenza PCR nonreactive. MRSA PCR screen negative. 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 s/p RUL lobectomy and node dissection by Dr. Koleen Nimrod on 5/16 Acute on chronic respiratory failure with hypoxia-on 4 L at baseline. -7/10-CTA chest concerning for alveolar hemorrhage, loculated fluid at right lung apex lined by masslike nodularity consistent with recurrence of malignancy and right peritracheal LAD -7/19-repeat CTA chest without significant change, may be improved hemorrhage -Respiratory culture with normal respiratory flora -Bronchoscopy on 7/11-no active hemorrhage -Hemoptysis improved/nearly resolved. -Already completed 7 days of IV cefepime on 7/18 -Rad/onc-started radiation on 7/13>>> reportedly planned for 6  and half week -Pain control as below. -Appreciate help by PCCM-weaning steroid. -Oncology to arrange outpatient follow-up for systemic chemotherapy after radiation therapy    Uncontrolled cancer-related pain: Rates his pain 6/10 today -Improved with fentanyl patch, injection and PCA.  Fentanyl patch increased to 75  mcg today. -Also on Celebrex and gabapentin. -On scheduled and as needed Valium for anxiety -As needed Narcan   Leukocytosis: Likely demargination from steroid.  Improved -Continue monitoring   Tooth abscess: Was on penicillin V outpatient.  Plan for root canal treatment on 7/11 but postponed.  Notable pea-sized swelling at the base of tooth #19 -Dr. Magda Paganini consulted and will see patient in the morning, 7/22.   Anemia of chronic disease due to cancer: H&H relatively stable. Recent Labs    03/18/21 2208 03/19/21 0215 03/20/21 0407 03/21/21 0057 03/22/21 0212 03/23/21 0532 03/24/21 0224 03/25/21 0357 03/27/21 0749 03/28/21 0335  HGB 9.8* 9.4* 8.8* 8.5* 8.5* 8.6* 8.1* 8.3* 9.2* 9.0*  -Monitor H&H    Essential hypertension: Normotensive. -Continue home amlodipine, irbesartan.  Controlled DM-2 with hyperglycemia: A1c 6.5%.  Hyperglycemia likely due to steroid.  Not on meds at home Recent Labs  Lab 03/28/21 1140 03/28/21 1632 03/28/21 2258 03/29/21 0724 03/29/21 1124  GLUCAP 395* 282* 221* 318* 358*  -Continue SSI-high -Increase NovoLog from 8 to 12 units 3 times daily with meals -Increase Lantus from 10 to 20 units twice daily -Further adjustment as appropriate. -Wean from steroid as able    History of anxiety/depression:  -On Seroquel, Zoloft and Valium   GERD: -On  Protonix     Body mass index is 25.84 kg/m. Nutrition Problem: Increased nutrient needs Etiology: cancer and cancer related treatments Signs/Symptoms: estimated needs Interventions: Ensure Enlive (each supplement provides 350kcal and 20 grams of protein), MVI   DVT prophylaxis:  Place and maintain sequential compression device Start: 03/29/21 0835 SCDs Start: 03/18/21 1527  Code Status: Full code Family Communication: Patient and/or RN. None at bedside today Level of care: Progressive Status is: Inpatient  Remains inpatient appropriate because:Ongoing active pain requiring inpatient pain  management, IV treatments appropriate due to intensity of illness or inability to take PO, and Inpatient level of care appropriate due to severity of illness  Dispo: The patient is from: Home              Anticipated d/c is to: Home              Patient currently is not medically stable to d/c.   Difficult to place patient No       Consultants:  PCCM Cardiothoracic surgery-signed off IR-signed off Palliative medicine Oncology Radiation oncology   Sch Meds:  Scheduled Meds:  (feeding supplement) PROSource Plus  30 mL Oral BID BM   albuterol  2.5 mg Nebulization Q6H   amLODipine  10 mg Oral Daily   azithromycin  250 mg Oral Daily   benzonatate  200 mg Oral BID   celecoxib  200 mg Oral BID   diazepam  2 mg Oral Q8H   feeding supplement  237 mL Oral BID BM   fentaNYL  1 patch Transdermal Q72H   fentaNYL   Intravenous Q4H   ferrous sulfate  325 mg Oral Q breakfast   gabapentin  300 mg Oral BID WC   gabapentin  600 mg Oral QHS   guaiFENesin  600 mg Oral BID   insulin aspart  0-20 Units Subcutaneous TID WC   insulin aspart  0-5 Units Subcutaneous QHS  insulin aspart  10 Units Subcutaneous TID WC   insulin glargine  20 Units Subcutaneous BID   irbesartan  300 mg Oral Daily   latanoprost  1 drop Both Eyes QHS   lidocaine  1 patch Transdermal Q24H   multivitamin with minerals  1 tablet Oral Daily   pantoprazole  40 mg Oral Daily   predniSONE  20 mg Oral BID WC   QUEtiapine  100 mg Oral QHS   sertraline  100 mg Oral Daily   sodium chloride flush  3 mL Intravenous Q12H   Continuous Infusions:  tranexamic acid     PRN Meds:.acetaminophen, albuterol, diazepam, diphenhydrAMINE **OR** diphenhydrAMINE, fentaNYL (SUBLIMAZE) injection, fentaNYL (SUBLIMAZE) injection, naloxone **AND** sodium chloride flush, ondansetron **OR** ondansetron (ZOFRAN) IV, ondansetron (ZOFRAN) IV, polyethylene glycol  Antimicrobials: Anti-infectives (From admission, onward)    Start     Dose/Rate  Route Frequency Ordered Stop   03/29/21 1200  azithromycin (ZITHROMAX) tablet 250 mg       Note to Pharmacy: For dental infection   250 mg Oral Daily 03/29/21 1100     03/19/21 0900  vancomycin (VANCOREADY) IVPB 1250 mg/250 mL  Status:  Discontinued        1,250 mg 166.7 mL/hr over 90 Minutes Intravenous Every 12 hours 03/18/21 1842 03/20/21 1059   03/18/21 2200  ceFEPIme (MAXIPIME) 2 g in sodium chloride 0.9 % 100 mL IVPB        2 g 200 mL/hr over 30 Minutes Intravenous Every 8 hours 03/18/21 1836 03/26/21 0835   03/18/21 1930  vancomycin (VANCOREADY) IVPB 1500 mg/300 mL        1,500 mg 150 mL/hr over 120 Minutes Intravenous  Once 03/18/21 1836 03/19/21 0734   03/18/21 1915  azithromycin (ZITHROMAX) 500 mg in sodium chloride 0.9 % 250 mL IVPB  Status:  Discontinued        500 mg 250 mL/hr over 60 Minutes Intravenous Every 24 hours 03/18/21 1822 03/19/21 0840   03/18/21 1800  penicillin v potassium (VEETID) tablet 500 mg  Status:  Discontinued       Note to Pharmacy: Start date : 03/14/21     500 mg Oral 4 times daily 03/18/21 1527 03/18/21 1842        I have personally reviewed the following labs and images: CBC: Recent Labs  Lab 03/23/21 0532 03/24/21 0224 03/25/21 0357 03/27/21 0749 03/28/21 0335  WBC 23.3* 23.7* 21.5* 20.4* 18.7*  NEUTROABS 20.3* 20.7* 18.0*  --   --   HGB 8.6* 8.1* 8.3* 9.2* 9.0*  HCT 27.1* 25.2* 25.9* 29.2* 28.6*  MCV 94.8 93.3 93.8 95.7 95.3  PLT 377 412* 416* 467* 467*   BMP &GFR Recent Labs  Lab 03/23/21 0532 03/24/21 0224 03/27/21 0749 03/28/21 0335  NA 134*  --  139 138  K 4.1  --  4.1 4.4  CL 98  --  100 99  CO2 28  --  29 29  GLUCOSE 171*  --  238* 288*  BUN 10  --  13 13  CREATININE 0.52*  --  0.42* 0.40*  CALCIUM 9.2  --  9.0 8.9  MG  --  2.1 2.0 2.0  PHOS  --  3.2 2.9 2.7   Estimated Creatinine Clearance: 103.9 mL/min (A) (by C-G formula based on SCr of 0.4 mg/dL (L)). Liver & Pancreas: Recent Labs  Lab 03/27/21 0749  03/28/21 0335  ALBUMIN 2.6* 2.6*   No results for input(s): LIPASE, AMYLASE in the last 168 hours.  No results for input(s): AMMONIA in the last 168 hours. Diabetic: No results for input(s): HGBA1C in the last 72 hours. Recent Labs  Lab 03/28/21 1140 03/28/21 1632 03/28/21 2258 03/29/21 0724 03/29/21 1124  GLUCAP 395* 282* 221* 318* 358*   Cardiac Enzymes: No results for input(s): CKTOTAL, CKMB, CKMBINDEX, TROPONINI in the last 168 hours. No results for input(s): PROBNP in the last 8760 hours. Coagulation Profile: No results for input(s): INR, PROTIME in the last 168 hours. Thyroid Function Tests: No results for input(s): TSH, T4TOTAL, FREET4, T3FREE, THYROIDAB in the last 72 hours. Lipid Profile: No results for input(s): CHOL, HDL, LDLCALC, TRIG, CHOLHDL, LDLDIRECT in the last 72 hours. Anemia Panel: No results for input(s): VITAMINB12, FOLATE, FERRITIN, TIBC, IRON, RETICCTPCT in the last 72 hours. Urine analysis:    Component Value Date/Time   COLORURINE YELLOW 01/18/2021 1500   APPEARANCEUR CLEAR 01/18/2021 1500   LABSPEC 1.006 01/18/2021 1500   PHURINE 6.0 01/18/2021 1500   GLUCOSEU NEGATIVE 01/18/2021 1500   HGBUR SMALL (A) 01/18/2021 1500   BILIRUBINUR NEGATIVE 01/18/2021 1500   KETONESUR NEGATIVE 01/18/2021 1500   PROTEINUR NEGATIVE 01/18/2021 1500   NITRITE NEGATIVE 01/18/2021 1500   LEUKOCYTESUR NEGATIVE 01/18/2021 1500   Sepsis Labs: Invalid input(s): PROCALCITONIN, [place]  Microbiology: Recent Results (from the past 240 hour(s))  Expectorated Sputum Assessment w Gram Stain, Rflx to Resp Cult     Status: None   Collection Time: 03/20/21  9:24 PM   Specimen: Sputum  Result Value Ref Range Status   Specimen Description SPUTUM  Final   Special Requests NONE  Final   Sputum evaluation   Final    THIS SPECIMEN IS ACCEPTABLE FOR SPUTUM CULTURE Performed at [facility], [address], [place], [place] [address]    Report Status 03/21/2021 FINAL   Final  Culture, Respiratory w Gram Stain     Status: None   Collection Time: 03/20/21  9:24 PM   Specimen: SPU  Result Value Ref Range Status   Specimen Description SPUTUM  Final   Special Requests NONE Reflexed from [id number]  Final   Gram Stain   Final    FEW WBC PRESENT,BOTH PMN AND MONONUCLEAR FEW GRAM POSITIVE COCCI RARE GRAM NEGATIVE RODS    Culture   Final    RARE Normal respiratory flora-no Staph aureus or Pseudomonas seen Performed at [facility], [facility] [address], [place], [place] [address]    Report Status 03/24/2021 FINAL  Final    Radiology Studies: No results found.    Glennys Schorsch T. [place]  If 7PM-7AM, please contact night-coverage [email or web address] 03/29/2021, 2:57 PM

## 2021-03-29 NOTE — Progress Notes (Signed)
Stopped basal rate on PCA. Increased TD fentanyl to 26mcg. Will continue to assess pca usage daily. Will begin transition to an oral med for breakthrough tomorrow. Also started daily azithromycin- he is starting to have dental pain from his abscessed tooth. Discussed plan with patient and his wife at bedside.  Christopher Hacker, DO Palliative Medicine

## 2021-03-29 NOTE — Progress Notes (Signed)
PT Cancellation Note  Patient Details Name: LISA BLAKEMAN MRN: [id number] DOB: Jan 15, 1961   Cancelled Treatment:    Reason Eval/Treat Not Completed: Patient at procedure or test/unavailable (pt leaving room for radiation. Will attempt tomorrow.)   Philomena Doheny PT 03/29/2021  Acute Rehabilitation Services Pager [phone] Office [phone]

## 2021-03-29 NOTE — Progress Notes (Signed)
NAME:  Christopher Carter, MRN:  [id number], DOB:  1960/10/20, LOS: 69 ADMISSION DATE:  03/18/2021, CONSULTATION DATE:  03/18/2021 REFERRING MD:  Dr. Tamala Julian, CHIEF COMPLAINT:  Hemoptysis    History of Present Illness:  Christopher Carter is a 60 y.o. 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 Patient has been treated at this facility for similar presentation and underwent video bronchoscopy and IR placed chest tube for pleural effusion with Dr. Roxan Hockey. He was discharged in stable condition with plans to follow up with cardiothoracic surgery and oncology.  He presented this admission with recurrent hemoptysis that started 4hr prior to admission with associated right upper back pain that radiates to upper neck. Patient was transferred from [place] [facility] [place] to [facility] for cardiothoracic, pulmonary, and IR consults. Vital signs stable, WBC elevated at 18.1. CT chest was obtained and consistent with alveolar hemorrhage localized to the right with right apical fluid collection.   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 rate 20, height 5\' 10"  (1.778 m), weight 81.7 kg, SpO2 96 %.    FiO2 (%):  [36 %] 36 %   Intake/Output Summary (Last 24 hours) at 03/29/2021  0852 Last data filed at 03/29/2021 2671 Gross per 24 hour  Intake 481 ml  Output 1400 ml  Net -919 ml   Filed Weights   03/18/21 0240 03/18/21 0242 03/25/21 1542  Weight: 78.9 kg 79.4 kg 81.7 kg    Examination: HEENT: Moist oral mucosa Neuro: Awake and interactive CV: S1-S2 appreciated PULM: Decreased air movement bilaterally GI: Bowel sounds appreciated   Labs reviewed -Leukocytosis 18.7, hematocrit of 28.6 platelet count of 467-7/20  CT scan of the chest 03/18/2021 reviewed by myself showing alveolar interstitial process  Repeat CT scan of the chest is stable 03/27/2021 Resolved Hospital Problem list     Assessment & Plan:  Acute on chronic hypoxemic respiratory failure Hemoptysis -Hemoptysis is improving -Switched back to oral steroids, prednisone 20 p.o. twice daily for 5 days, then 20 p.o. daily for another 5 days and stop -Alveolitis continues to improve on CT  Stage IV adenocarcinoma of the lung s/p right upper lobectomy -Loculated fluid in the right upper chest with a thick pleural rind  Persistent pain and discomfort -Improved pain control at present  Discussed with Dr. [facility]   Diet/type: Regular consistency (see orders) DVT prophylaxis: SCD GI prophylaxis: N/A Lines: N/A Foley:  N/A Code Status:  full code Last date of multidisciplinary goals of care discussion: Per primary   Labs   CBC: Recent Labs  Lab 03/23/21 0532 03/24/21 0224 03/25/21 0357 03/27/21 0749 03/28/21 0335  WBC 23.3* 23.7* 21.5* 20.4* 18.7*  NEUTROABS 20.3* 20.7* 18.0*  --   --   HGB 8.6* 8.1* 8.3* 9.2* 9.0*  HCT 27.1* 25.2* 25.9* 29.2* 28.6*  MCV 94.8 93.3  93.8 95.7 95.3  PLT 377 412* 416* 467* 467*    Basic Metabolic Panel: Recent Labs  Lab 03/23/21 0532 03/24/21 0224 03/27/21 0749 03/28/21 0335  NA 134*  --  139 138  K 4.1  --  4.1 4.4  CL 98  --  100 99  CO2 28  --  29 29  GLUCOSE 171*  --  238* 288*  BUN 10  --  13 13  CREATININE 0.52*  --   0.42* 0.40*  CALCIUM 9.2  --  9.0 8.9  MG  --  2.1 2.0 2.0  PHOS  --  3.2 2.9 2.7   GFR: Estimated Creatinine Clearance: 103.9 mL/min (A) (by C-G formula based on SCr of 0.4 mg/dL (L)). Recent Labs  Lab 03/24/21 0224 03/25/21 0357 03/27/21 0749 03/28/21 0335  WBC 23.7* 21.5* 20.4* 18.7*    Sherrilyn Rist, MD [place] [facility] Pager: See Shea Evans

## 2021-03-30 ENCOUNTER — Telehealth: Payer: Self-pay | Admitting: Internal Medicine

## 2021-03-30 ENCOUNTER — Ambulatory Visit
Admit: 2021-03-30 | Discharge: 2021-03-30 | Disposition: A | Discharge status: Home / Self Care | Payer: 59 | Attending: Radiation Oncology | Admitting: Radiation Oncology

## 2021-03-30 DIAGNOSIS — D72829 Elevated white blood cell count, unspecified: Secondary | ICD-10-CM | POA: Diagnosis not present

## 2021-03-30 DIAGNOSIS — K047 Periapical abscess without sinus: Secondary | ICD-10-CM | POA: Diagnosis not present

## 2021-03-30 DIAGNOSIS — Z0120 Encounter for dental examination and cleaning without abnormal findings: Secondary | ICD-10-CM | POA: Diagnosis not present

## 2021-03-30 DIAGNOSIS — C3411 Malignant neoplasm of upper lobe, right bronchus or lung: Secondary | ICD-10-CM | POA: Diagnosis not present

## 2021-03-30 DIAGNOSIS — K0381 Cracked tooth: Secondary | ICD-10-CM | POA: Diagnosis not present

## 2021-03-30 DIAGNOSIS — I10 Essential (primary) hypertension: Secondary | ICD-10-CM | POA: Diagnosis not present

## 2021-03-30 DIAGNOSIS — J9621 Acute and chronic respiratory failure with hypoxia: Secondary | ICD-10-CM | POA: Diagnosis not present

## 2021-03-30 DIAGNOSIS — R0489 Hemorrhage from other sites in respiratory passages: Secondary | ICD-10-CM | POA: Diagnosis not present

## 2021-03-30 LAB — GLUCOSE, CAPILLARY
Glucose-Capillary: 245 mg/dL — ABNORMAL HIGH (ref 70–99)
Glucose-Capillary: 316 mg/dL — ABNORMAL HIGH (ref 70–99)
Glucose-Capillary: 290 mg/dL — ABNORMAL HIGH (ref 70–99)
Glucose-Capillary: 261 mg/dL — ABNORMAL HIGH (ref 70–99)

## 2021-03-30 LAB — CBC
HCT: 28.9 % — ABNORMAL LOW (ref 39.0–52.0)
Hemoglobin: 9.2 g/dL — ABNORMAL LOW (ref 13.0–17.0)
MCH: 30.9 pg (ref 26.0–34.0)
MCHC: 31.8 g/dL (ref 30.0–36.0)
MCV: 97 fL (ref 80.0–100.0)
Platelets: 427 10*3/uL — ABNORMAL HIGH (ref 150–400)
RBC: 2.98 MIL/uL — ABNORMAL LOW (ref 4.22–5.81)
RDW: 15.7 % — ABNORMAL HIGH (ref 11.5–15.5)
WBC: 21.8 10*3/uL — ABNORMAL HIGH (ref 4.0–10.5)
nRBC: 0 % (ref 0.0–0.2)

## 2021-03-30 MED ORDER — INSULIN GLARGINE 100 UNIT/ML ~~LOC~~ SOLN
25.0000 [IU] | Freq: Two times a day (BID) | SUBCUTANEOUS | Status: DC
  Administered 2021-03-30 – 2021-03-31 (×2): 25 [IU] via SUBCUTANEOUS
  Filled 2021-03-30 (×3): qty 0.25

## 2021-03-30 MED ORDER — AMOXICILLIN-POT CLAVULANATE 875-125 MG PO TABS
1.0000 | ORAL_TABLET | Freq: Two times a day (BID) | ORAL | Status: DC
  Administered 2021-03-30 – 2021-04-01 (×6): 1 via ORAL
  Filled 2021-03-30 (×6): qty 1

## 2021-03-30 NOTE — Progress Notes (Signed)
PROGRESS NOTE  Christopher Carter ZWC:[id number] DOB: [birth year]   PCP: [name], MD  Patient is from: Home  DOA: 03/18/2021 LOS: 30  Chief complaints:  Chief Complaint  Patient presents with   Hemoptysis   Back Pain     Brief Narrative / Interim history: 60 year old M with PMH of stage IV adenocarcinoma s/p RUL lobectomy with node dissection on 5/16, recurrent hemoptysis, chronic RF on 5 L, tobacco abuse, HTN, depression, loculated hemithorax treated with IR chest tube placement, empiric antibiotics and prednisone taper and discharged to follow-up with CTS and radiation oncology.  He returned with cough with hemoptysis, right upper back pain, and increased oxygen requirement.  CT chest showed airspace disease in right lung representing alveolar hemorrhage with recurrence of apical fluid collection, nodular masslike lesion concerning for recurrence of malignancy and right peritracheal adenopathy.  He was admitted to [facility].  CTS, pulmonary IR and palliative consulted.  Started on IV cefepime, systemic steroid and tranexamic acid nebulization.  He underwent bronchoscopy that did not reveal active bleeding.  Hemoptysis seems to have resolved.  Started on PCA Dilaudid by PMT for pain control, and transferred to [facility] for radiation oncology care.  Main issue is pain control.   Subjective: Seen and examined earlier this morning.  No major events overnight of this morning.  Pain seems to be fairly controlled.  He rates his pain 5/10.  No other complaints.  No hemoptysis.  Denies shortness of breath or GI symptoms.  Objective: Vitals:   03/30/21 0808 03/30/21 0849 03/30/21 0903 03/30/21 1246  BP:  123/66 123/66   Pulse:      Resp:   18 17  Temp:   97.9 F (36.6 C)   TempSrc:   Oral   SpO2: 98%  97% 96%  Weight:      Height:        Intake/Output Summary (Last 24 hours) at 03/30/2021 1256 Last data filed at 03/30/2021 0903 Gross per 24 hour  Intake 568 ml  Output 750 ml   Net -182 ml   Filed Weights   03/18/21 0240 03/18/21 0242 03/25/21 1542  Weight: 78.9 kg 79.4 kg 81.7 kg    Examination:  GENERAL: No apparent distress.  Nontoxic. HEENT: MMM.  Vision and hearing grossly intact.  NECK: Supple.  No apparent JVD.  RESP: On 4 L.  No IWOB.  Fair aeration bilaterally. CVS:  RRR. Heart sounds normal.  ABD/GI/GU: BS+. Abd soft, NTND.  MSK/EXT:  Moves extremities. No apparent deformity. No edema.  SKIN: no apparent skin lesion or wound NEURO: Awake and alert. Oriented appropriately.  No apparent focal neuro deficit. PSYCH: Calm. Normal affect.   Procedures:  7/11-bronchoscopy  Microbiology summarized: [id number]-14 and influenza PCR nonreactive. MRSA PCR screen negative. 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 s/p RUL lobectomy and node dissection by Dr. Koleen Nimrod on 5/16 Acute on chronic respiratory failure with hypoxia-on 4 L at baseline. -7/10-CTA chest concerning for alveolar hemorrhage, loculated fluid at right lung apex lined by masslike nodularity consistent with recurrence of malignancy and right peritracheal LAD -7/19-repeat CTA chest without significant change, may be improved hemorrhage -Respiratory culture with normal respiratory flora -Bronchoscopy on 7/11-no active hemorrhage -Hemoptysis improved/nearly resolved. -Completed 7 days of IV cefepime on 7/18.  -Rad/onc-started radiation on 7/13>>> reportedly planned for 6 and half week -Pain control  as below. -Appreciate help by PCCM-weaning steroid. -Oncology to arrange outpatient follow-up for systemic chemotherapy after radiation therapy    Uncontrolled cancer-related pain: Rates his pain 6/10 today -Improved with fentanyl patch, injection and PCA.  Fentanyl patch increased to 75 mcg 7/21. -Also on Celebrex and  gabapentin. -On scheduled and as needed Valium for anxiety -Narcan as needed   Leukocytosis: Likely demargination from steroid.  Improved -Continue monitoring   Dental pain/possible infection: Was on penicillin V outpatient.  Plan for root canal treatment on 7/11 but postponed.  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 Recent Labs    03/19/21 0215 03/20/21 0407 03/21/21 0057 03/22/21 0212 03/23/21 0532 03/24/21 0224 03/25/21 0357 03/27/21 0749 03/28/21 0335 03/30/21 0339  HGB 9.4* 8.8* 8.5* 8.5* 8.6* 8.1* 8.3* 9.2* 9.0* 9.2*  -Monitor H&H    Essential hypertension: Normotensive. -Continue home amlodipine, irbesartan.  Controlled DM-2 with hyperglycemia: A1c 6.5%.  Hyperglycemia likely due to steroid.  Not on meds at home Recent Labs  Lab 03/29/21 1124 03/29/21 1645 03/29/21 2142 03/30/21 0748 03/30/21 1235  GLUCAP 358* 121* 255* 245* 316*  -Continue SSI-high -Continue NovoLog 12 units 3 times daily with meals -Increase Lantus from 20 to 25 units -Anticipate improvement in CBG once off steroid starting tomorrow -Further adjustment as appropriate    History of anxiety/depression:  -On Seroquel, Zoloft and Valium   GERD: -On  Protonix     Body mass index is 25.84 kg/m. Nutrition Problem: Increased nutrient needs Etiology: cancer and cancer related treatments Signs/Symptoms: estimated needs Interventions: Ensure Enlive (each supplement provides 350kcal and 20 grams of protein), MVI   DVT prophylaxis:  Place and maintain sequential compression device Start: 03/30/21 0804 Place and maintain sequential compression device Start: 03/29/21 0835 SCDs Start: 03/18/21 1527  Code Status: Full code Family Communication: Patient and/or RN. None at bedside today Level  of care: Progressive Status is: Inpatient  Remains inpatient appropriate because:Ongoing active pain requiring inpatient pain management, IV treatments appropriate due to intensity of illness or inability to take PO, and Inpatient level of care appropriate due to severity of illness  Dispo: The patient is from: Home              Anticipated d/c is to: Home              Patient currently is not medically stable to d/c.   Difficult to place patient No       Consultants:  PCCM Cardiothoracic surgery-signed off IR-signed off Palliative medicine Oncology Radiation oncology Dental surgery   Sch Meds:  Scheduled Meds:  (feeding supplement) PROSource Plus  30 mL Oral BID BM   albuterol  2.5 mg Nebulization Q6H   amLODipine  10 mg Oral Daily   amoxicillin-clavulanate  1 tablet Oral Q12H   azithromycin  250 mg Oral Daily   benzonatate  200 mg Oral BID   celecoxib  200 mg Oral BID   diazepam  2 mg Oral Q8H   feeding supplement  237 mL Oral BID BM   fentaNYL  1 patch Transdermal Q72H   fentaNYL   Intravenous Q4H   ferrous sulfate  325 mg Oral Q breakfast   gabapentin  300 mg Oral BID WC   gabapentin  600 mg Oral QHS   guaiFENesin  600 mg Oral  BID   insulin aspart  0-20 Units Subcutaneous TID WC   insulin aspart  0-5 Units Subcutaneous QHS   insulin aspart  12 Units Subcutaneous TID WC   insulin glargine  25 Units Subcutaneous BID   irbesartan  300 mg Oral Daily   latanoprost  1 drop Both Eyes QHS   lidocaine  1 patch Transdermal Q24H   multivitamin with minerals  1 tablet Oral Daily   pantoprazole  40 mg Oral Daily   predniSONE  20 mg Oral BID WC   QUEtiapine  100 mg Oral QHS   sertraline  100 mg Oral Daily   sodium chloride flush  3 mL Intravenous Q12H   Continuous Infusions:  tranexamic acid     PRN Meds:.acetaminophen, albuterol, diazepam, diphenhydrAMINE **OR** diphenhydrAMINE, fentaNYL (SUBLIMAZE) injection, fentaNYL (SUBLIMAZE) injection, naloxone **AND** sodium  chloride flush, ondansetron **OR** ondansetron (ZOFRAN) IV, ondansetron (ZOFRAN) IV, polyethylene glycol  Antimicrobials: Anti-infectives (From admission, onward)    Start     Dose/Rate Route Frequency Ordered Stop   03/30/21 1130  amoxicillin-clavulanate (AUGMENTIN) 875-125 MG per tablet 1 tablet        1 tablet Oral Every 12 hours 03/30/21 1035 04/06/21 0959   03/29/21 1200  azithromycin (ZITHROMAX) tablet 250 mg       Note to Pharmacy: For dental infection   250 mg Oral Daily 03/29/21 1100     03/19/21 0900  vancomycin (VANCOREADY) IVPB 1250 mg/250 mL  Status:  Discontinued        1,250 mg 166.7 mL/hr over 90 Minutes Intravenous Every 12 hours 03/18/21 1842 03/20/21 1059   03/18/21 2200  ceFEPIme (MAXIPIME) 2 g in sodium chloride 0.9 % 100 mL IVPB        2 g 200 mL/hr over 30 Minutes Intravenous Every 8 hours 03/18/21 1836 03/26/21 0835   03/18/21 1930  vancomycin (VANCOREADY) IVPB 1500 mg/300 mL        1,500 mg 150 mL/hr over 120 Minutes Intravenous  Once 03/18/21 1836 03/19/21 0734   03/18/21 1915  azithromycin (ZITHROMAX) 500 mg in sodium chloride 0.9 % 250 mL IVPB  Status:  Discontinued        500 mg 250 mL/hr over 60 Minutes Intravenous Every 24 hours 03/18/21 1822 03/19/21 0840   03/18/21 1800  penicillin v potassium (VEETID) tablet 500 mg  Status:  Discontinued       Note to Pharmacy: Start date : 03/14/21     500 mg Oral 4 times daily 03/18/21 1527 03/18/21 1842        I have personally reviewed the following labs and images: CBC: Recent Labs  Lab 03/24/21 0224 03/25/21 0357 03/27/21 0749 03/28/21 0335 03/30/21 0339  WBC 23.7* 21.5* 20.4* 18.7* 21.8*  NEUTROABS 20.7* 18.0*  --   --   --   HGB 8.1* 8.3* 9.2* 9.0* 9.2*  HCT 25.2* 25.9* 29.2* 28.6* 28.9*  MCV 93.3 93.8 95.7 95.3 97.0  PLT 412* 416* 467* 467* 427*   BMP &GFR Recent Labs  Lab 03/24/21 0224 03/27/21 0749 03/28/21 0335  NA  --  139 138  K  --  4.1 4.4  CL  --  100 99  CO2  --  29 29   GLUCOSE  --  238* 288*  BUN  --  13 13  CREATININE  --  0.42* 0.40*  CALCIUM  --  9.0 8.9  MG 2.1 2.0 2.0  PHOS 3.2 2.9 2.7   Estimated Creatinine Clearance: 103.9 mL/min (A) (  by C-G formula based on SCr of 0.4 mg/dL (L)). Liver & Pancreas: Recent Labs  Lab 03/27/21 0749 03/28/21 0335  ALBUMIN 2.6* 2.6*   No results for input(s): LIPASE, AMYLASE in the last 168 hours. No results for input(s): AMMONIA in the last 168 hours. Diabetic: No results for input(s): HGBA1C in the last 72 hours. Recent Labs  Lab 03/29/21 1124 03/29/21 1645 03/29/21 2142 03/30/21 0748 03/30/21 1235  GLUCAP 358* 121* 255* 245* 316*   Cardiac Enzymes: No results for input(s): CKTOTAL, CKMB, CKMBINDEX, TROPONINI in the last 168 hours. No results for input(s): PROBNP in the last 8760 hours. Coagulation Profile: No results for input(s): INR, PROTIME in the last 168 hours. Thyroid Function Tests: No results for input(s): TSH, T4TOTAL, FREET4, T3FREE, THYROIDAB in the last 72 hours. Lipid Profile: No results for input(s): CHOL, HDL, LDLCALC, TRIG, CHOLHDL, LDLDIRECT in the last 72 hours. Anemia Panel: No results for input(s): VITAMINB12, FOLATE, FERRITIN, TIBC, IRON, RETICCTPCT in the last 72 hours. Urine analysis:    Component Value Date/Time   COLORURINE YELLOW 01/18/2021 1500   APPEARANCEUR CLEAR 01/18/2021 1500   LABSPEC 1.006 01/18/2021 1500   PHURINE 6.0 01/18/2021 1500   GLUCOSEU NEGATIVE 01/18/2021 1500   HGBUR SMALL (A) 01/18/2021 1500   BILIRUBINUR NEGATIVE 01/18/2021 1500   KETONESUR NEGATIVE 01/18/2021 1500   PROTEINUR NEGATIVE 01/18/2021 1500   NITRITE NEGATIVE 01/18/2021 1500   LEUKOCYTESUR NEGATIVE 01/18/2021 1500   Sepsis Labs: Invalid input(s): PROCALCITONIN, [place]  Microbiology: Recent Results (from the past 240 hour(s))  Expectorated Sputum Assessment w Gram Stain, Rflx to Resp Cult     Status: None   Collection Time: 03/20/21  9:24 PM   Specimen: Sputum  Result  Value Ref Range Status   Specimen Description SPUTUM  Final   Special Requests NONE  Final   Sputum evaluation   Final    THIS SPECIMEN IS ACCEPTABLE FOR SPUTUM CULTURE Performed at [facility], [address], [place], [place] [address]    Report Status 03/21/2021 FINAL  Final  Culture, Respiratory w Gram Stain     Status: None   Collection Time: 03/20/21  9:24 PM   Specimen: SPU  Result Value Ref Range Status   Specimen Description SPUTUM  Final   Special Requests NONE Reflexed from [id number]  Final   Gram Stain   Final    FEW WBC PRESENT,BOTH PMN AND MONONUCLEAR FEW GRAM POSITIVE COCCI RARE GRAM NEGATIVE RODS    Culture   Final    RARE Normal respiratory flora-no Staph aureus or Pseudomonas seen Performed at [facility], [place] [address], [place], [place] [address]    Report Status 03/24/2021 FINAL  Final    Radiology Studies: No results found.    Maddyson Keil T. [place]  If 7PM-7AM, please contact night-coverage [email or web address] 03/30/2021, 12:56 PM

## 2021-03-30 NOTE — Evaluation (Signed)
Physical Therapy Evaluation Patient Details Name: Christopher Carter MRN: [id number] DOB: [birth year] Today's Date: 03/30/2021   History of Present Illness  60 y.o. male who presented to [facility] for recurrent hemoptysis. Dx of intra alveolar hemorrhage, acute on chronic respiratory failure.  Pt with a PMH significant for Stage IV adenocarcinoma now S/P right upper lobectomy with node dissection 01/22/2021, on 5L [place] at baseline, tobacco abuse, HTN, and depression  Clinical Impression  Pt is a 60 y.o. male with above HPI resulting in the deficits listed below (see PT Problem List). Pt ambulated ~148ft with MIN guard for safety with mild balance deficits noted (not formally tested). Pt has been experiencing pain in R rhomboid/lower trapezius region (improved today) noted to be tender to palpate with increased muscle guarding and reports of tingling in 4th/5th digits with palpation. Ulnar nerve glides negative, decreased strength and increased discomfort with ROM on R UE, suspect muscular origin for pain based on UE assessment today. PT educated pt on use of ice/heat and potential for focus on strength training and posture to further assist with pain control. Pt is currently below baseline mobility level and with complaints of R rhomboid/lower trapezius muscle pain. Pt will benefit from continued skilled PT to address listed impairments to increase their independence, for hopeful management of pain, and to maximize safety with mobility.      Follow Up Recommendations Home health PT;Supervision for mobility/OOB    Equipment Recommendations       Recommendations for Other Services       Precautions / Restrictions Precautions Precautions: Fall Restrictions Weight Bearing Restrictions: No      Mobility  Bed Mobility Overal bed mobility: Needs Assistance Bed Mobility: Supine to Sit     Supine to sit: Supervision     General bed mobility comments: supervision for safety     Transfers Overall transfer level: Needs assistance Equipment used: None Transfers: Sit to/from Stand Sit to Stand: Min guard         General transfer comment: MIN guard for safety and use of B UEs for power up  Ambulation/Gait Ambulation/Gait assistance: Min guard Gait Distance (Feet): 160 Feet Assistive device: IV Pole Gait Pattern/deviations: Step-through pattern;Decreased stride length;Wide base of support Gait velocity: decr   General Gait Details: MIN guard for safety with use of IV pole. Pt noted to mildly unsteady and reported that he feels like hsibalance is a little off. No LOB observed with single UE support on IV pole for stability.  Stairs            Wheelchair Mobility    Modified Rankin (Stroke Patients Only)       Balance Overall balance assessment: Needs assistance Sitting-balance support: Feet supported Sitting balance-Leahy Scale: Good     Standing balance support: Single extremity supported Standing balance-Leahy Scale: Fair Standing balance comment: able to maintain static standing, use of UE support on IV pole for dynamic tasks                             Pertinent Vitals/Pain Pain Assessment: No/denies pain (provided ice at EOS for posterior shoulder upon pt request)    Home Living Family/patient expects to be discharged to:: Private residence Living Arrangements: Spouse/significant other Available Help at Discharge: Family;Friend(s) Type of Home: House (townhouse) Home Access: Other (comment) (4" threshold)     Home Layout: Two level;Able to live on main level with bedroom/bathroom (master bedroom main level)  Home Equipment: [place] - 2 wheels;Bedside commode Additional Comments: Pt reports I with no AD prior to admission. denies history of falls. Pt is unsure of what other equipment he may have access to if needed.    Prior Function Level of Independence: Independent               Hand Dominance         Extremity/Trunk Assessment   Upper Extremity Assessment Upper Extremity Assessment: RUE deficits/detail RUE Deficits / Details: pt has been experiencing pain in R rhomboid/lower trapezius region (improved today). Pt with grossly 4/5 R elbow flexion/extension strength and 4-/5 shoulder flexion/ABD strength. Onset of R posterior shoulder discomfort at ~90deg of AROM ABD. Tender to palpate R rhomboid/lower trap region with some tingling into 4th/5th digit. Increased muscle guarding on R noted upon palpation. No onset of symptoms with ulnar nerve glides. Pt reports he normally uses icr/heat to assist with alleviating pain. RUE Sensation: WNL    Lower Extremity Assessment Lower Extremity Assessment: Overall WFL for tasks assessed    Cervical / Trunk Assessment Cervical / Trunk Assessment: Normal  Communication   Communication: No difficulties  Cognition Arousal/Alertness: Awake/alert Behavior During Therapy: WFL for tasks assessed/performed Overall Cognitive Status: Within Functional Limits for tasks assessed                                        General Comments General comments (skin integrity, edema, etc.): pt on 4L O2, remained >96% throughout session, HR MAX 117bpm    Exercises     Assessment/Plan    PT Assessment Patient needs continued PT services  PT Problem List Decreased strength;Decreased range of motion;Decreased activity tolerance;Decreased balance;Decreased mobility;Pain       PT Treatment Interventions DME instruction;Gait training;Stair training;Functional mobility training;Therapeutic activities;Therapeutic exercise;Patient/family education;Balance training;Manual techniques    PT Goals (Current goals can be found in the Care Plan section)  Acute Rehab PT Goals Patient Stated Goal: Get moving more and have less pain in R arm PT Goal Formulation: With patient/family Time For Goal Achievement: 04/13/21 Potential to Achieve Goals: Good     Frequency Min 3X/week   Barriers to discharge        Co-evaluation               AM-PAC PT "6 Clicks" Mobility  Outcome Measure Help needed turning from your back to your side while in a flat bed without using bedrails?: None Help needed moving from lying on your back to sitting on the side of a flat bed without using bedrails?: A Little Help needed moving to and from a bed to a chair (including a wheelchair)?: A Little Help needed standing up from a chair using your arms (e.g., wheelchair or bedside chair)?: A Little Help needed to walk in hospital room?: A Little Help needed climbing 3-5 steps with a railing? : A Lot 6 Click Score: 18    End of Session Equipment Utilized During Treatment: Gait belt Activity Tolerance: Patient tolerated treatment well Patient left: in chair;with call bell/phone within reach;with chair alarm set;with family/visitor present;with nursing/sitter in room Nurse Communication: Mobility status PT Visit Diagnosis: Unsteadiness on feet (R26.81);Pain;Muscle weakness (generalized) (M62.81) Pain - Right/Left: Right Pain - part of body: Arm (rhomboid/lower trap region)    Time: 9629-5284 PT Time Calculation (min) (ACUTE ONLY): 43 min   Charges:   PT Evaluation $PT Eval Low Complexity: 1  Low PT Treatments $Therapeutic Activity: 23-37 mins       Festus Barren PT, DPT  Acute Rehabilitation Services  Office [phone]  03/30/2021, 6:09 PM

## 2021-03-30 NOTE — Plan of Care (Signed)
  Problem: Education: Goal: Knowledge of General Education information will improve Description: Including pain rating scale, medication(s)/side effects and non-pharmacologic comfort measures Outcome: Progressing   Problem: Activity: Goal: Risk for activity intolerance will decrease Outcome: Progressing   Problem: Coping: Goal: Level of anxiety will decrease Outcome: Progressing   Problem: Elimination: Goal: Will not experience complications related to urinary retention Outcome: Progressing   Problem: Pain Managment: Goal: General experience of comfort will improve Outcome: Progressing   Problem: Safety: Goal: Ability to remain free from injury will improve Outcome: Progressing   Problem: Skin Integrity: Goal: Risk for impaired skin integrity will decrease Outcome: Progressing   Problem: Activity: Goal: Ability to tolerate increased activity will improve Outcome: Progressing   Problem: Respiratory: Goal: Ability to maintain adequate ventilation will improve Outcome: Progressing Goal: Ability to maintain a clear airway will improve Outcome: Progressing

## 2021-03-30 NOTE — Progress Notes (Signed)
Received report from nurse, Tess RN, about patient at 2330. Agree with patient's current assessment. Will continue to monitor patient for any changes and will document.

## 2021-03-30 NOTE — Progress Notes (Signed)
Nurse had received a call from Tele that patient's heart rate was in the 140's, but patient was up to the bathroom and was being active.

## 2021-03-30 NOTE — Telephone Encounter (Signed)
Scheduled appointment per 07/22 sch msg. Patient is aware.

## 2021-03-30 NOTE — Consult Note (Signed)
Department of Dental Medicine           INPATIENT CONSULT  Service Date:   03/30/2021 Admitted Date:  03/18/2021  Patient Name:   Christopher Carter Date of Birth:   03-18-1961 Medical Record Number: [id number]  Referring Provider:                   Wendee Beavers, MD        TODAY'S VISIT:   Overall impression:   There are no current signs of acute odontogenic infection including abscess, edema or erythema, or suspicious lesion requiring biopsy.   Tooth #19 is cracked and there is evidence of previous sinus tract/parulis with no drainable fluid collection at this time.  Recommendations:   No dental intervention indicated at this time.   If the patient develops worsening pain or facial swelling/difficulty swallowing or trismus, he will need to be reevaluated for potential extraction. Plan:   Discuss case with medical team and coordinate treatment as needed.   Treatment of tooth #19 with root canal therapy + crown as originally planned upon discharge and recovery.  Discussed in detail all treatment options and recommendations with the patient and they are agreeable to the plan.    Thank you for consulting with Hospital Dentistry and for the opportunity to participate in this patient's treatment.  Should you have any questions or concerns, please contact the [facility] at [phone].        PROGRESS NOTE:   COVID-19 SCREENING:  The patient denies symptoms concerning for COVID-19 infection including fever, chills, cough, or newly developed shortness of breath.   HISTORY OF PRESENT ILLNESS: Christopher Carter is a very pleasant 60 y.o. male with h/o HTN, respiratory failure, major depressive disorder, anemia and lung cancer s/p lobectomy and currently undergoing radiation therapy who is currently admitted for pain management related to cancer.  Hospital dentistry was consulted to complete a medically-necessary dental evaluation to evaluate history of tooth  infection/swelling and pain.   DENTAL HISTORY: The patient reports that he does have a dentist he sees regularly, Dr. Raynald Kemp who he sees regularly every 6 months.  He reports that he had a tooth that he was supposed to have a root canal on a tooth (points to #19), but he missed he scheduled appointment because he was admitted to the hospital.  He currently denies any dental/orofacial pain or sensitivity related to the tooth.  He has been on antibiotics since he was admitted due to pain and swelling, but this has improved with antibiotic treatment.   Patient is able to manage oral secretions.  Patient denies dysphagia, odynophagia, dysphonia, SOB and neck pain.  Patient denies fever, rigors and malaise.   CHIEF COMPLAINT:  Tooth infection/abscess.   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   Acute on chronic respiratory failure with hypoxia ([place]) 02/26/2021   Malignant neoplasm of right upper lobe of lung ([place]) 02/26/2021   Pneumonia of right lung due to infectious organism 02/26/2021   Sepsis due to pneumonia ([place]) 02/26/2021   Major depressive disorder 02/26/2021   S/P lobectomy of lung 01/22/2021   Essential hypertension 01/15/2021   Mass of upper lobe of right lung 12/22/2020  Hemoptysis 12/22/2020   Past Medical History:  Diagnosis Date   Anxiety    Cancer ([place]) 12/2020   Depression    Hypertension    Pneumonia    1990's   Past Surgical History:  Procedure Laterality Date   ANKLE FRACTURE SURGERY Right    BRONCHIAL BIOPSY  01/05/2021   Procedure: BRONCHIAL BIOPSIES;  Surgeon: Collene Gobble, MD;  Location: [place];  Service: Cardiopulmonary;;   BRONCHIAL  BRUSHINGS  01/05/2021   Procedure: BRONCHIAL BRUSHINGS;  Surgeon: Collene Gobble, MD;  Location: [place];  Service: Cardiopulmonary;;   BRONCHIAL NEEDLE ASPIRATION BIOPSY  01/05/2021   Procedure: BRONCHIAL NEEDLE ASPIRATION BIOPSIES;  Surgeon: Collene Gobble, MD;  Location: [place];  Service: Cardiopulmonary;;   COLON SURGERY  2017   bowel obstruction surgery per pt    ENDOBRONCHIAL ULTRASOUND N/A 01/05/2021   Procedure: ENDOBRONCHIAL ULTRASOUND;  Surgeon: Collene Gobble, MD;  Location: [place];  Service: Cardiopulmonary;  Laterality: N/A;   INTERCOSTAL NERVE BLOCK Right 01/22/2021   Procedure: INTERCOSTAL NERVE BLOCK;  Surgeon: [name], MD;  Location: [place];  Service: Thoracic;  Laterality: Right;   LAPAROSCOPY N/A 04/25/2017   Procedure: LAPAROSCOPY ASSISTED ILEO-CECECTOMY WITH SMALL BOWEL RESECTION WITH ANASTOMOSIS;  Surgeon: Excell Seltzer, MD;  Location: [facility];  Service: General;  Laterality: N/A;   LYMPH NODE DISSECTION Right 01/22/2021   Procedure: LYMPH NODE DISSECTION;  Surgeon: [name], MD;  Location: [place];  Service: Thoracic;  Laterality: Right;   THORACOTOMY Right 01/22/2021   Procedure: THORACOTOMY;  Surgeon: [name], MD;  Location: [place];  Service: Thoracic;  Laterality: Right;   VIDEO ASSISTED THORACOSCOPY (VATS)/ LOBECTOMY Right 01/22/2021   Procedure: VIDEO ASSISTED THORACOSCOPY (VATS)/RIGHT UPPER LOBECTOMY;  Surgeon: [name], MD;  Location: ;  Service: Thoracic;  Laterality: Right;   VIDEO BRONCHOSCOPY N/A 01/05/2021   Procedure: VIDEO BRONCHOSCOPY WITH FLUORO;  Surgeon: Collene Gobble, MD;  Location: [facility];  Service: Cardiopulmonary;  Laterality: N/A;   VIDEO BRONCHOSCOPY N/A 03/19/2021   Procedure: VIDEO BRONCHOSCOPY WITHOUT FLUORO;  Surgeon: [name], DO;  Location: [facility];  Service: Endoscopy;  Laterality: N/A;  need cryo probe available   Allergies  Allergen Reactions    Oxycodone Hcl Other (See Comments)    Pt reported "seeing things" and " feeling unusual."   Bupropion Nausea And Vomiting   Current Facility-Administered Medications  Medication Dose Route Frequency Provider Last Rate Last Admin   (feeding supplement) PROSource Plus liquid 30 mL  30 mL Oral BID BM Gonfa, Taye T, MD   30 mL at 03/29/21 2132   acetaminophen (TYLENOL) tablet 1,000 mg  1,000 mg Oral Q8H PRN Fuller Plan A, MD   1,000 mg at 03/18/21 1630   albuterol (PROVENTIL) (2.5 MG/3ML) 0.083% nebulizer solution 2.5 mg  2.5 mg Nebulization Q2H PRN Noemi Chapel P, DO   2.5 mg at 03/27/21 1028   albuterol (PROVENTIL) (2.5 MG/3ML) 0.083% nebulizer solution 2.5 mg  2.5 mg Nebulization Q6H Adhikari, Amrit, MD   2.5 mg at 03/30/21 0808   amLODipine (NORVASC) tablet 10 mg  10 mg Oral Daily Fuller Plan A, MD   10 mg at 03/30/21 0849   azithromycin (ZITHROMAX) tablet 250 mg  250 mg Oral Daily Lane Hacker L, DO   250 mg at 03/30/21 3976   benzonatate (TESSALON) capsule 200 mg  200 mg Oral BID Lane Hacker L, DO   200 mg at 03/30/21 [id number]  celecoxib (CELEBREX) capsule 200 mg  200 mg Oral BID Lane Hacker L, DO   200 mg at 03/30/21 7672   diazepam (VALIUM) tablet 10 mg  10 mg Oral Daily PRN Lane Hacker L, DO   10 mg at 03/29/21 1403   diazepam (VALIUM) tablet 2 mg  2 mg Oral Q8H Lane Hacker L, DO   2 mg at 03/30/21 0947   diphenhydrAMINE (BENADRYL) injection 12.5 mg  12.5 mg Intravenous Q6H PRN Norval Morton, MD       Or   diphenhydrAMINE (BENADRYL) 12.5 MG/5ML elixir 12.5 mg  12.5 mg Oral Q6H PRN Smith, Rondell A, MD       feeding supplement (ENSURE ENLIVE / ENSURE PLUS) liquid 237 mL  237 mL Oral BID BM Gonfa, Taye T, MD   237 mL at 03/30/21 0907   fentaNYL (DURAGESIC) 75 MCG/HR 1 patch  1 patch Transdermal Q72H Lane Hacker L, DO   1 patch at 03/29/21 1322   fentaNYL (SUBLIMAZE) injection 50 mcg  50 mcg Intravenous Daily PRN Lane Hacker L, DO   50 mcg  at 03/27/21 1434   fentaNYL (SUBLIMAZE) injection 50 mcg  50 mcg Intravenous Q1H PRN Lane Hacker L, DO   50 mcg at 03/29/21 1627   fentaNYL 50 mcg/mL PCA injection   Intravenous Q4H Acquanetta Chain, DO   Syringe Replaced at 03/29/21 1654   ferrous sulfate tablet 325 mg  325 mg Oral Q breakfast Shelly Coss, MD   325 mg at 03/30/21 0962   gabapentin (NEURONTIN) capsule 300 mg  300 mg Oral BID WC Smith, Rondell A, MD   300 mg at 03/30/21 0849   gabapentin (NEURONTIN) capsule 600 mg  600 mg Oral QHS Smith, Rondell A, MD   600 mg at 03/29/21 2131   guaiFENesin (MUCINEX) 12 hr tablet 600 mg  600 mg Oral BID Shelly Coss, MD   600 mg at 03/30/21 0849   insulin aspart (novoLOG) injection 0-20 Units  0-20 Units Subcutaneous TID WC Wendee Beavers T, MD   7 Units at 03/30/21 0856   insulin aspart (novoLOG) injection 0-5 Units  0-5 Units Subcutaneous QHS Wendee Beavers T, MD   3 Units at 03/29/21 2143   insulin aspart (novoLOG) injection 12 Units  12 Units Subcutaneous TID WC Gonfa, Taye T, MD   12 Units at 03/30/21 0849   insulin glargine (LANTUS) injection 25 Units  25 Units Subcutaneous BID Gonfa, Taye T, MD       irbesartan (AVAPRO) tablet 300 mg  300 mg Oral Daily Smith, Rondell A, MD   300 mg at 03/30/21 0849   latanoprost (XALATAN) 0.005 % ophthalmic solution 1 drop  1 drop Both Eyes QHS Smith, Rondell A, MD   1 drop at 03/29/21 2133   lidocaine (LIDODERM) 5 % 1 patch  1 patch Transdermal Q24H Fuller Plan A, MD   1 patch at 03/27/21 1933   multivitamin with minerals tablet 1 tablet  1 tablet Oral Daily Shelly Coss, MD   1 tablet at 03/30/21 0849   naloxone (NARCAN) injection 0.4 mg  0.4 mg Intravenous PRN Fuller Plan A, MD       And   sodium chloride flush (NS) 0.9 % injection 9 mL  9 mL Intravenous PRN Tamala Julian, Rondell A, MD       ondansetron (ZOFRAN) tablet 4 mg  4 mg Oral Q6H PRN Norval Morton, MD       Or   ondansetron (ZOFRAN)  injection 4 mg  4 mg Intravenous Q6H PRN  Fuller Plan A, MD       ondansetron (ZOFRAN) injection 4 mg  4 mg Intravenous Q6H PRN Smith, Rondell A, MD       pantoprazole (PROTONIX) EC tablet 40 mg  40 mg Oral Daily Smith, Rondell A, MD   40 mg at 03/30/21 0849   polyethylene glycol (MIRALAX / GLYCOLAX) packet 17 g  17 g Oral Daily PRN Fuller Plan A, MD       predniSONE (DELTASONE) tablet 20 mg  20 mg Oral BID WC Olalere, Adewale A, MD   20 mg at 03/30/21 0849   QUEtiapine (SEROQUEL) tablet 100 mg  100 mg Oral QHS Smith, Rondell A, MD   100 mg at 03/29/21 2132   sertraline (ZOLOFT) tablet 100 mg  100 mg Oral Daily Smith, Rondell A, MD   100 mg at 03/30/21 0849   sodium chloride flush (NS) 0.9 % injection 3 mL  3 mL Intravenous Q12H Smith, Rondell A, MD   3 mL at 03/30/21 7169   tranexamic acid (CYKLOKAPRON) IVPB 1,000 mg  1,000 mg Intravenous Once [name], DO        LABS: Lab Results  Component Value Date   WBC 21.8 (H) 03/30/2021   HGB 9.2 (L) 03/30/2021   HCT 28.9 (L) 03/30/2021   MCV 97.0 03/30/2021   PLT 427 (H) 03/30/2021      Component Value Date/Time   NA 138 03/28/2021 0335   K 4.4 03/28/2021 0335   CL 99 03/28/2021 0335   CO2 29 03/28/2021 0335   GLUCOSE 288 (H) 03/28/2021 0335   BUN 13 03/28/2021 0335   CREATININE 0.40 (L) 03/28/2021 0335   CREATININE 0.78 01/09/2021 1525   CALCIUM 8.9 03/28/2021 0335   GFRNONAA >60 03/28/2021 0335   GFRNONAA >60 01/09/2021 1525   GFRAA >60 04/27/2017 0520   Lab Results  Component Value Date   INR 1.1 03/19/2021   INR 1.0 03/18/2021   INR 1.0 03/18/2021   No results found for: PTT  Social History   Socioeconomic History   Marital status: Married    Spouse name: Not on file   Number of children: Not on file   Years of education: Not on file   Highest education level: Not on file  Occupational History   Not on file  Tobacco Use   Smoking status: Never   Smokeless tobacco: Never  Vaping Use   Vaping Use: Never used  Substance and Sexual Activity    Alcohol use: Yes    Alcohol/week: 22.0 standard drinks    Types: 1 Cans of beer, 21 Shots of liquor per week    Comment: 3-4 shots of whiskey a day   Drug use: No   Sexual activity: Not on file  Other Topics Concern   Not on file  Social History Narrative   Not on file   Social Determinants of Health   Financial Resource Strain: Not on file  Food Insecurity: Not on file  Transportation Needs: Not on file  Physical Activity: Not on file  Stress: Not on file  Social Connections: Not on file  Intimate Partner Violence: Not on file   History reviewed. No pertinent family history.   REVIEW OF SYSTEMS:  Reviewed with the patient as per HPI. Psych: Patient denies having dental phobia.   VITAL SIGNS: BP 123/66 (BP Location: Right Arm)   Pulse 92   Temp 97.9 F (36.6 C) (  Oral)   Resp 18   Ht 5\' 10"  (1.778 m)   Wt 81.7 kg   SpO2 97%   BMI 25.84 kg/m    PHYSICAL EXAM: General:  Well-developed, comfortable and in no apparent distress. Neurological:  Alert and oriented to person, place and  time. Extraoral:  Facial symmetry present without any edema or erythema.  No swelling or lymphadenopathy.  TMJ asymptomatic without clicks or crepitations. Intraoral:  Soft tissues appear well-perfused and mucous membranes moist.  FOM and vestibules soft and not raised. Oral cavity without mass or lesion. No signs of infection, edema or erythema evident upon exam.  (+) Small area on lingual mucosa apical to #19 indicative of possible past sinus tract or parulis that has drained.   DENTAL EXAM:  All clinical findings charted.   Overall impression:  Good remaining dentition. Oral hygiene:  Good  Periodontal:  Pink, healthy gingival tissue with blunted papilla. Endodontics:   Tested teeth numbers 18, 19 and 20 for percussion, palpation, caries, mobility, air/water.  Tooth #19 (++) percussion. Removable/fixed prosthodontics:  Patient denies wearing partial dentures. Occlusion:  Class I  molar occlusion. 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   ASSESSMENT:  1.  Adenocarcinoma of lung 2.  Facial swelling/tooth infection 3.  Missing teeth 4.  Cracked tooth #19   PLAN AND RECOMMENDATIONS: I discussed the risks, benefits, and complications of various scenarios with the patient in relationship to their medical and dental conditions, which included systemic infection or other serious issues that could potentially occur if dental/oral concerns are not addressed.  I explained that if any chronic or acute dental/oral infection(s) are addressed and subsequently not maintained following medical optimization and recovery, their risk of the previously mentioned complications are just as high and could potentially occur postoperatively.  I explained all significant findings of the dental consultation with the patient including #19 cracked tooth and the recommended care which is consistent with what his dentist recommended of root canal therapy on #19 in order to optimize them from a dental standpoint.  I explained that currently there are no signs of worsening infection or abscess to drain and that the antibiotics seem to be keeping the infection under control.  He expresses that he would still like to try and save the tooth with a root canal upon discharge, however he is aware that if his infection becomes a lot worse or he develops any kind of facial swelling or submandibular pain/swelling the tooth will need to be extracted at the hospital.  The patient verbalized understanding of all findings, discussion, and recommendations. We then discussed various treatment options to include no treatment, multiple extractions with alveoloplasty, pre-prosthetic surgery as indicated, periodontal therapy, dental restorations, root canal therapy, crown and bridge therapy, implant therapy, and replacement of missing teeth as indicated.  The patient  verbalized understanding of all options, and currently wishes to proceed with root canal therapy as originally planned once he is discharged, but understands extraction may be necessary. Plan to discuss all findings and recommendations with medical team and coordinate future care as needed.  The patient will need to return to their regular dentist for routine dental care including replacement of missing teeth as needed, cleanings and exams.   All questions and concerns were invited and addressed.  The patient tolerated today's visit well.  [place] Benson Norway, D.M.D.

## 2021-03-31 DIAGNOSIS — R52 Pain, unspecified: Secondary | ICD-10-CM | POA: Diagnosis not present

## 2021-03-31 DIAGNOSIS — J9621 Acute and chronic respiratory failure with hypoxia: Secondary | ICD-10-CM | POA: Diagnosis not present

## 2021-03-31 DIAGNOSIS — D72829 Elevated white blood cell count, unspecified: Secondary | ICD-10-CM | POA: Diagnosis not present

## 2021-03-31 DIAGNOSIS — R0489 Hemorrhage from other sites in respiratory passages: Secondary | ICD-10-CM | POA: Diagnosis not present

## 2021-03-31 DIAGNOSIS — Z515 Encounter for palliative care: Secondary | ICD-10-CM | POA: Diagnosis not present

## 2021-03-31 DIAGNOSIS — I10 Essential (primary) hypertension: Secondary | ICD-10-CM | POA: Diagnosis not present

## 2021-03-31 LAB — GLUCOSE, CAPILLARY
Glucose-Capillary: 250 mg/dL — ABNORMAL HIGH (ref 70–99)
Glucose-Capillary: 142 mg/dL — ABNORMAL HIGH (ref 70–99)
Glucose-Capillary: 85 mg/dL (ref 70–99)
Glucose-Capillary: 231 mg/dL — ABNORMAL HIGH (ref 70–99)

## 2021-03-31 MED ORDER — INSULIN ASPART 100 UNIT/ML IJ SOLN
0.0000 [IU] | Freq: Every day | INTRAMUSCULAR | Status: DC
  Administered 2021-03-31: 2 [IU] via SUBCUTANEOUS

## 2021-03-31 MED ORDER — FENTANYL 100 MCG/HR TD PT72
1.0000 | MEDICATED_PATCH | TRANSDERMAL | Status: DC
Start: 2021-03-31 — End: 2021-04-10
  Administered 2021-03-31 – 2021-04-09 (×4): 1 via TRANSDERMAL
  Filled 2021-03-31 (×4): qty 1

## 2021-03-31 MED ORDER — MORPHINE SULFATE 15 MG PO TABS
7.5000 mg | ORAL_TABLET | ORAL | Status: DC | PRN
  Administered 2021-03-31 – 2021-04-09 (×22): 7.5 mg via ORAL
  Filled 2021-03-31 (×25): qty 1

## 2021-03-31 MED ORDER — INSULIN ASPART 100 UNIT/ML IJ SOLN
8.0000 [IU] | Freq: Three times a day (TID) | INTRAMUSCULAR | Status: DC
  Administered 2021-03-31: 8 [IU] via SUBCUTANEOUS

## 2021-03-31 MED ORDER — FENTANYL 50 MCG/ML IV PCA SOLN
INTRAVENOUS | Status: DC
Start: 2021-03-31 — End: 2021-04-01
  Administered 2021-03-31: 200 ug via INTRAVENOUS
  Administered 2021-03-31: 0 ug/h via INTRAVENOUS
  Administered 2021-03-31: 279.5 ug via INTRAVENOUS
  Administered 2021-04-01 (×3): 50 ug via INTRAVENOUS
  Administered 2021-04-01: 100 ug via INTRAVENOUS

## 2021-03-31 MED ORDER — INSULIN GLARGINE 100 UNIT/ML ~~LOC~~ SOLN
10.0000 [IU] | Freq: Two times a day (BID) | SUBCUTANEOUS | Status: DC
  Administered 2021-03-31 – 2021-04-10 (×19): 10 [IU] via SUBCUTANEOUS
  Filled 2021-03-31 (×22): qty 0.1

## 2021-03-31 MED ORDER — INSULIN ASPART 100 UNIT/ML IJ SOLN: 0.0000 [IU] | Freq: Three times a day (TID) | INTRAMUSCULAR | Status: DC

## 2021-03-31 MED ORDER — INSULIN ASPART 100 UNIT/ML IJ SOLN
0.0000 [IU] | Freq: Three times a day (TID) | INTRAMUSCULAR | Status: DC
  Administered 2021-04-01: 2 [IU] via SUBCUTANEOUS

## 2021-03-31 NOTE — Progress Notes (Signed)
Daily Progress Note   Patient Name: Christopher Carter       Date: 03/31/2021 DOB: 08-24-1961  Age: 60 y.o. MRN#: [id number] Attending Physician: Mercy Riding, MD Primary Care Physician: [name], MD Admit Date: 03/18/2021  Reason for Consultation/Follow-up: Pain control  Subjective: [name] is awake alert sitting up in bed.  He had a reasonably good night.  Rested well.  He is eating breakfast.  We discussed extensively about pain management options.  Patient is able to be up and about, is able to walk to the bathroom, states that the pain in his right chest/shoulder area is much improved, he is feeling some pain in his right hip this morning and possibly attributes it to being in the bed too long.  He is going to try to apply a lidocaine patch to the right hip area and see how he feels.  [name] states that he was seen and evaluated by orthodontist yesterday, he has been advised to undergo a root canal in the outpatient setting.  Hence, we discussed about pain management options, coming off of the PCA and arriving at appropriate opioid dosages of fentanyl patch plus an oral opioid for breakthrough that will work for him.  See below.  Length of Stay: 12  Current Medications: Scheduled Meds:   (feeding supplement) PROSource Plus  30 mL Oral BID BM   albuterol  2.5 mg Nebulization Q6H   amLODipine  10 mg Oral Daily   amoxicillin-clavulanate  1 tablet Oral Q12H   azithromycin  250 mg Oral Daily   benzonatate  200 mg Oral BID   celecoxib  200 mg Oral BID   diazepam  2 mg Oral Q8H   feeding supplement  237 mL Oral BID BM   fentaNYL  1 patch Transdermal Q72H   fentaNYL   Intravenous Q4H   ferrous sulfate  325 mg Oral Q breakfast   gabapentin  300 mg Oral BID WC   gabapentin  600 mg Oral  QHS   guaiFENesin  600 mg Oral BID   insulin aspart  0-20 Units Subcutaneous TID WC   insulin aspart  0-5 Units Subcutaneous QHS   insulin aspart  12 Units Subcutaneous TID WC   insulin glargine  25 Units Subcutaneous BID   irbesartan  300 mg Oral Daily  latanoprost  1 drop Both Eyes QHS   lidocaine  1 patch Transdermal Q24H   multivitamin with minerals  1 tablet Oral Daily   pantoprazole  40 mg Oral Daily   QUEtiapine  100 mg Oral QHS   sertraline  100 mg Oral Daily   sodium chloride flush  3 mL Intravenous Q12H    Continuous Infusions:  tranexamic acid      PRN Meds: acetaminophen, albuterol, diazepam, diphenhydrAMINE **OR** diphenhydrAMINE, fentaNYL (SUBLIMAZE) injection, fentaNYL (SUBLIMAZE) injection, morphine, naloxone **AND** sodium chloride flush, ondansetron **OR** ondansetron (ZOFRAN) IV, ondansetron (ZOFRAN) IV, polyethylene glycol  Physical Exam         Awake alert resting in bed Regular work of breathing No acute distress S1-S2 Abdomen is not tender No edema No focal deficits  Vital Signs: BP (!) 145/83   Pulse 87   Temp 98 F (36.7 C) (Oral)   Resp 15   Ht 5\' 10"  (1.778 m)   Wt 81.7 kg   SpO2 97%   BMI 25.84 kg/m  SpO2: SpO2: 97 % O2 Device: O2 Device: Nasal Cannula O2 Flow Rate: O2 Flow Rate (L/min): 4 L/min  Intake/output summary:  Intake/Output Summary (Last 24 hours) at 03/31/2021 0936 Last data filed at 03/31/2021 0400 Gross per 24 hour  Intake 420 ml  Output 650 ml  Net -230 ml   LBM: Last BM Date: 03/30/21 Baseline Weight: Weight: 78.9 kg Most recent weight: Weight: 81.7 kg       Palliative Assessment/Data: Palliative performance scale 50%     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   Uncontrolled pain 03/18/2021   Leukocytosis 03/18/2021   Normocytic anemia 03/18/2021   Tooth abscess 03/18/2021   Thrombocytosis  03/18/2021   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   Acute on chronic respiratory failure with hypoxia (HCC) 02/26/2021   Malignant neoplasm of right upper lobe of lung ([facility]) 02/26/2021   Pneumonia of right lung due to infectious organism 02/26/2021   Sepsis due to pneumonia ([facility]) 02/26/2021   Major depressive disorder 02/26/2021   S/P lobectomy of lung 01/22/2021   Essential hypertension 01/15/2021   Mass of upper lobe of right lung 12/22/2020   Hemoptysis 12/22/2020    Palliative Care Assessment & Plan   Patient Profile:    Assessment: 60 year old gentleman with history of 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  History of loculated hemithorax treated with IR chest tube placement empiric antibiotics prednisone taper.  Patient admitted to hospital medicine service because CT chest showing airspace disease in right lung representing alveolar hemorrhage, nodular masslike lesion concerning for recurrence of malignancy as well as right peritracheal adenopathy.  Started on IV antibiotic steroids and tranexamic acid nebulization.  Bronchoscopy did not reveal active bleeding.  Hemoptysis is now resolved.  Palliative medicine team has been consulted and is following for pain management.  Recommendations/Plan: Medication history reviewed.  Chart reviewed.  Discussed with Dr. Hilma Favors on 03-30-2021.  Discussed with patient in detail today. Patient is currently on fentanyl bolus only PCA, transdermal fentanyl 75 mcg strength patch and other adjuvants are also noted on the chart.  Patient states that overall his pain is reasonably well controlled.  We discussed about plans for coming off of the PCA.  Patient is very concerned  about having a pain exacerbation that will put him several steps back.  Offered active listening supportive care and  compassionate presence.  We discussed about taking cautious and judicious steps towards coming off of the PCA as well as options for p.o. opioids alongside transdermal fentanyl.  Plan: 1.  Increase strength of transdermal fentanyl from 75 to 100 mcg.  Could consider an up titration every 48 hours instead of 72 hours based on patient's overall opioid needs.  2.  We discussed about a reasonable p.o. opioid option.  Patient has taken oral oxycodone as well as oral hydromorphone in the past.  He describes various reactions as to why both these medications did not work out for him.  We discussed about morphine sulfate immediate release 7.5 mg every 4 hours on an as-needed basis if his pain is anywhere from 1-5 out of 10.  We will begin that today and assess how this works out for him.   3.  For any pain anywhere from about 5-10/10: We talked about still having access to fentanyl bolus only PCA 50 mcg to be available every 20 minutes rather than the current frequency which is every 15 minutes.  If the patient has a strenuous activity or if he feels that his pain is already above 5 out of 10 he will use his fentanyl bolus PCA.  Otherwise, he will make every attempt to see how he feels with p.o. morphine sulfate immediate release today and this is in addition to slight increase in strength of transdermal fentanyl patch from 75 to 100 mcg for long-term pain relief.  The patient hopes to come off of the PCA soon so that he can go home and get his dental infection addressed as he will need a root canal soon.  However, he does not want to be in acute severe uncontrolled pain hence, we will make cautious changes to his pain medication regimen and the hope is to arrive at a point where transdermal fentanyl + p.o. MS IR as needed will work for him and he will be off of the PCA.  At that point, he can be discharged home safely.  It remains to be seen how soon we can accomplish this.  Palliative medicine will continue to  follow closely.  Other adjuvants noted on the chart, continue as ordered.    Code Status:    Code Status Orders  (From admission, onward)           Start     Ordered   03/18/21 1527  Full code  Continuous        03/18/21 1527           Code Status History     Date Active Date Inactive Code Status Order ID Comments User Context   02/26/2021 0613 03/07/2021 1843 Full Code [id number]  Vernelle Emerald, MD ED   01/22/2021 1529 01/26/2021 1518 Full Code [id number]  Elgie Collard, PA-C Inpatient       Prognosis:  Unable to determine  Discharge Planning: To Be Determined  Care plan was discussed with  patient and RN.   Thank you for allowing the Palliative Medicine Team to assist in the care of this patient.   Time In:  9 Time Out: 9.45 Total Time 45 Prolonged Time Billed  no       Greater than 50%  of this time was spent counseling and coordinating care related to the above assessment and plan.  Loistine Chance, MD  Please contact Palliative Medicine Team phone at [phone] for questions and concerns.

## 2021-03-31 NOTE — Plan of Care (Signed)

## 2021-03-31 NOTE — Progress Notes (Signed)
Pt CBG 85.  MD notified.  New orders received.  Education provided to pt.  Verbalized understanding.  Will continue to monitor.

## 2021-03-31 NOTE — Progress Notes (Signed)
PROGRESS NOTE  Christopher Carter IWP:[id number] DOB: Feb 19, 1961   PCP: [name], MD  Patient is from: Home  DOA: 03/18/2021 LOS: 63  Chief complaints:  Chief Complaint  Patient presents with   Hemoptysis   Back Pain     Brief Narrative / Interim history: 60 year old M with PMH of stage IV adenocarcinoma s/p RUL lobectomy with node dissection on 5/16, recurrent hemoptysis, chronic RF on 5 L, tobacco abuse, HTN, depression, loculated hemithorax treated with IR chest tube placement, empiric antibiotics and prednisone taper and discharged to follow-up with CTS and radiation oncology.  He returned with cough with hemoptysis, right upper back pain, and increased oxygen requirement.  CT chest showed airspace disease in right lung representing alveolar hemorrhage with recurrence of apical fluid collection, nodular masslike lesion concerning for recurrence of malignancy and right peritracheal adenopathy.  He was admitted to [facility].  CTS, pulmonary IR and palliative consulted.  Started on IV cefepime, systemic steroid and tranexamic acid nebulization.  He underwent bronchoscopy that did not reveal active bleeding.  Hemoptysis seems to have resolved.  Started on PCA Dilaudid by PMT for pain control, and transferred to [facility] for radiation oncology care.  Main issue is pain control.   Subjective: Seen and examined earlier this morning.  He feels he is making a progress.  Pain is fairly controlled with fentanyl patch, IV and PCA.  Slight cough earlier in the morning but no hemoptysis.  Denies chest pain or dyspnea.  He walked in the hallway 3 times earlier this morning.  Objective: Vitals:   03/31/21 0811 03/31/21 0839 03/31/21 0842 03/31/21 1154  BP:   (!) 145/83   Pulse:      Resp:  15  17  Temp:      TempSrc:      SpO2: 99% 97%  98%  Weight:      Height:        Intake/Output Summary (Last 24 hours) at 03/31/2021 1208 Last data filed at 03/31/2021 0954 Gross per 24 hour  Intake  660 ml  Output 1075 ml  Net -415 ml   Filed Weights   03/18/21 0240 03/18/21 0242 03/25/21 1542  Weight: 78.9 kg 79.4 kg 81.7 kg    Examination:  GENERAL: No apparent distress.  Nontoxic. HEENT: MMM.  Vision and hearing grossly intact.  NECK: Supple.  No apparent JVD.  RESP: On 4 L.  No IWOB.  Fair aeration bilaterally. CVS:  RRR. Heart sounds normal.  ABD/GI/GU: BS+. Abd soft, NTND.  MSK/EXT:  Moves extremities. No apparent deformity. No edema.  SKIN: no apparent skin lesion or wound NEURO: Awake and alert. Oriented appropriately.  No apparent focal neuro deficit. PSYCH: Calm. Normal affect.   Procedures:  7/11-bronchoscopy  Microbiology summarized: [id number] and influenza PCR nonreactive. MRSA PCR screen negative. 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 s/p RUL lobectomy and node dissection by Dr. Koleen Nimrod on 5/16 Acute on chronic respiratory failure with hypoxia-on 4 L at baseline. -7/10-CTA chest concerning for alveolar hemorrhage, loculated fluid at right lung apex lined by masslike nodularity consistent with recurrence of malignancy and right peritracheal LAD -7/19-repeat CTA chest without significant change, may be improved hemorrhage -Respiratory culture with normal respiratory flora -Bronchoscopy on 7/11-no active hemorrhage -Hemoptysis improved/nearly resolved.  -Completed 7 days of IV cefepime on 7/18.  Came off systemic steroid  on 7/22 -Rad/onc-started radiation on 7/13>>> reportedly planned for 6 and half week -Oncology to arrange outpatient follow-up for systemic chemotherapy after radiation therapy -Wean oxygen to keep saturation above 92%.  Discussed with RN. -Pain control as below.    Uncontrolled cancer-related pain: Rates his pain 6/10 today -Palliative medicine managing  -fentanyl  patch increased to 100 mcg on 7/23. -Started MS IR 7.5 mg every 4 hours as needed -Fentanyl PCA -Celebrex and gabapentin. -On scheduled and as needed Valium for anxiety -Narcan as needed   Leukocytosis: Likely demargination from steroid.  Expect improvement now off steroid -Continue monitoring   Dental pain/possible infection: Was on penicillin V outpatient.  Plan for root canal treatment on 7/11 but postponed.  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 Recent Labs    03/19/21 0215 03/20/21 0407 03/21/21 0057 03/22/21 0212 03/23/21 0532 03/24/21 0224 03/25/21 0357 03/27/21 0749 03/28/21 0335 03/30/21 0339  HGB 9.4* 8.8* 8.5* 8.5* 8.6* 8.1* 8.3* 9.2* 9.0* 9.2*  -Monitor H&H    Essential hypertension: Normotensive. -Continue home amlodipine, irbesartan.  Controlled DM-2 with hyperglycemia: A1c 6.5%.  Hyperglycemia likely due to steroid.  Expect improvement now he is off steroid. Recent Labs  Lab 03/30/21 1235 03/30/21 1733 03/30/21 2023 03/31/21 0715 03/31/21 1126  GLUCAP 316* 290* 261* 250* 142*  -Continue SSI-high -Decrease NovoLog from 12 to 8 units 3 times daily with meals -Continue Lantus 25 units twice daily -May adjust based on his evening CBG -Further adjustment as appropriate    History of anxiety/depression:  -On Seroquel, Zoloft and Valium   GERD: -On  Protonix     Body mass index is 25.84 kg/m. Nutrition Problem: Increased nutrient needs Etiology: cancer and cancer related treatments Signs/Symptoms: estimated needs Interventions: Ensure Enlive (each supplement provides 350kcal and 20 grams of protein), MVI   DVT prophylaxis:  Place and maintain sequential compression device Start: 03/30/21 0804 Place and  maintain sequential compression device Start: 03/29/21 0835 SCDs Start: 03/18/21 1527  Code Status: Full code Family Communication: Patient and/or RN. None at bedside today Level of care: Progressive Status is: Inpatient  Remains inpatient appropriate because:Ongoing active pain requiring inpatient pain management, IV treatments appropriate due to intensity of illness or inability to take PO, and Inpatient level of care appropriate due to severity of illness  Dispo: The patient is from: Home              Anticipated d/c is to: Home              Patient currently is not medically stable to d/c.   Difficult to place patient No       Consultants:  PCCM Cardiothoracic surgery-signed off IR-signed off Palliative medicine Oncology Radiation oncology Dental surgery   Sch Meds:  Scheduled Meds:  (feeding supplement) PROSource Plus  30 mL Oral BID BM   albuterol  2.5 mg Nebulization Q6H   amLODipine  10 mg Oral Daily   amoxicillin-clavulanate  1 tablet Oral Q12H   azithromycin  250 mg Oral Daily   benzonatate  200 mg Oral BID   celecoxib  200 mg Oral BID   diazepam  2 mg Oral Q8H   feeding supplement  237 mL Oral BID BM   fentaNYL  1 patch Transdermal Q72H   fentaNYL  Intravenous Q4H   ferrous sulfate  325 mg Oral Q breakfast   gabapentin  300 mg Oral BID WC   gabapentin  600 mg Oral QHS   guaiFENesin  600 mg Oral BID   insulin aspart  0-20 Units Subcutaneous TID WC   insulin aspart  0-5 Units Subcutaneous QHS   insulin aspart  12 Units Subcutaneous TID WC   insulin glargine  25 Units Subcutaneous BID   irbesartan  300 mg Oral Daily   latanoprost  1 drop Both Eyes QHS   lidocaine  1 patch Transdermal Q24H   multivitamin with minerals  1 tablet Oral Daily   pantoprazole  40 mg Oral Daily   QUEtiapine  100 mg Oral QHS   sertraline  100 mg Oral Daily   sodium chloride flush  3 mL Intravenous Q12H   Continuous Infusions:  tranexamic acid     PRN Meds:.acetaminophen,  albuterol, diazepam, diphenhydrAMINE **OR** diphenhydrAMINE, fentaNYL (SUBLIMAZE) injection, fentaNYL (SUBLIMAZE) injection, morphine, naloxone **AND** sodium chloride flush, ondansetron **OR** ondansetron (ZOFRAN) IV, ondansetron (ZOFRAN) IV, polyethylene glycol  Antimicrobials: Anti-infectives (From admission, onward)    Start     Dose/Rate Route Frequency Ordered Stop   03/30/21 1130  amoxicillin-clavulanate (AUGMENTIN) 875-125 MG per tablet 1 tablet        1 tablet Oral Every 12 hours 03/30/21 1035 04/06/21 0959   03/29/21 1200  azithromycin (ZITHROMAX) tablet 250 mg       Note to Pharmacy: For dental infection   250 mg Oral Daily 03/29/21 1100     03/19/21 0900  vancomycin (VANCOREADY) IVPB 1250 mg/250 mL  Status:  Discontinued        1,250 mg 166.7 mL/hr over 90 Minutes Intravenous Every 12 hours 03/18/21 1842 03/20/21 1059   03/18/21 2200  ceFEPIme (MAXIPIME) 2 g in sodium chloride 0.9 % 100 mL IVPB        2 g 200 mL/hr over 30 Minutes Intravenous Every 8 hours 03/18/21 1836 03/26/21 0835   03/18/21 1930  vancomycin (VANCOREADY) IVPB 1500 mg/300 mL        1,500 mg 150 mL/hr over 120 Minutes Intravenous  Once 03/18/21 1836 03/19/21 0734   03/18/21 1915  azithromycin (ZITHROMAX) 500 mg in sodium chloride 0.9 % 250 mL IVPB  Status:  Discontinued        500 mg 250 mL/hr over 60 Minutes Intravenous Every 24 hours 03/18/21 1822 03/19/21 0840   03/18/21 1800  penicillin v potassium (VEETID) tablet 500 mg  Status:  Discontinued       Note to Pharmacy: Start date : 03/14/21     500 mg Oral 4 times daily 03/18/21 1527 03/18/21 1842        I have personally reviewed the following labs and images: CBC: Recent Labs  Lab 03/25/21 0357 03/27/21 0749 03/28/21 0335 03/30/21 0339  WBC 21.5* 20.4* 18.7* 21.8*  NEUTROABS 18.0*  --   --   --   HGB 8.3* 9.2* 9.0* 9.2*  HCT 25.9* 29.2* 28.6* 28.9*  MCV 93.8 95.7 95.3 97.0  PLT 416* 467* 467* 427*   BMP &GFR Recent Labs  Lab  03/27/21 0749 03/28/21 0335  NA 139 138  K 4.1 4.4  CL 100 99  CO2 29 29  GLUCOSE 238* 288*  BUN 13 13  CREATININE 0.42* 0.40*  CALCIUM 9.0 8.9  MG 2.0 2.0  PHOS 2.9 2.7   Estimated Creatinine Clearance: 103.9 mL/min (A) (by C-G formula based on SCr of 0.4  mg/dL (L)). Liver & Pancreas: Recent Labs  Lab 03/27/21 0749 03/28/21 0335  ALBUMIN 2.6* 2.6*   No results for input(s): LIPASE, AMYLASE in the last 168 hours. No results for input(s): AMMONIA in the last 168 hours. Diabetic: No results for input(s): HGBA1C in the last 72 hours. Recent Labs  Lab 03/30/21 1235 03/30/21 1733 03/30/21 2023 03/31/21 0715 03/31/21 1126  GLUCAP 316* 290* 261* 250* 142*   Cardiac Enzymes: No results for input(s): CKTOTAL, CKMB, CKMBINDEX, TROPONINI in the last 168 hours. No results for input(s): PROBNP in the last 8760 hours. Coagulation Profile: No results for input(s): INR, PROTIME in the last 168 hours. Thyroid Function Tests: No results for input(s): TSH, T4TOTAL, FREET4, T3FREE, THYROIDAB in the last 72 hours. Lipid Profile: No results for input(s): CHOL, HDL, LDLCALC, TRIG, CHOLHDL, LDLDIRECT in the last 72 hours. Anemia Panel: No results for input(s): VITAMINB12, FOLATE, FERRITIN, TIBC, IRON, RETICCTPCT in the last 72 hours. Urine analysis:    Component Value Date/Time   COLORURINE YELLOW 01/18/2021 1500   APPEARANCEUR CLEAR 01/18/2021 1500   LABSPEC 1.006 01/18/2021 1500   PHURINE 6.0 01/18/2021 1500   GLUCOSEU NEGATIVE 01/18/2021 1500   HGBUR SMALL (A) 01/18/2021 1500   BILIRUBINUR NEGATIVE 01/18/2021 1500   KETONESUR NEGATIVE 01/18/2021 1500   PROTEINUR NEGATIVE 01/18/2021 1500   NITRITE NEGATIVE 01/18/2021 1500   LEUKOCYTESUR NEGATIVE 01/18/2021 1500   Sepsis Labs: Invalid input(s): PROCALCITONIN, [place]  Microbiology: No results found for this or any previous visit (from the past 240 hour(s)).   Radiology Studies: No results found.    Karlynn Furrow T.  [place]  If 7PM-7AM, please contact night-coverage [email or web address] 03/31/2021, 12:08 PM

## 2021-03-31 NOTE — Plan of Care (Signed)
  Problem: Health Behavior/Discharge Planning: Goal: Ability to manage health-related needs will improve Outcome: Progressing   Problem: Clinical Measurements: Goal: Respiratory complications will improve Outcome: Progressing   Problem: Coping: Goal: Level of anxiety will decrease Outcome: Progressing   Problem: Pain Managment: Goal: General experience of comfort will improve Outcome: Progressing

## 2021-04-01 DIAGNOSIS — Z515 Encounter for palliative care: Secondary | ICD-10-CM | POA: Diagnosis not present

## 2021-04-01 DIAGNOSIS — D72829 Elevated white blood cell count, unspecified: Secondary | ICD-10-CM | POA: Diagnosis not present

## 2021-04-01 DIAGNOSIS — I10 Essential (primary) hypertension: Secondary | ICD-10-CM | POA: Diagnosis not present

## 2021-04-01 DIAGNOSIS — R52 Pain, unspecified: Secondary | ICD-10-CM | POA: Diagnosis not present

## 2021-04-01 DIAGNOSIS — R0489 Hemorrhage from other sites in respiratory passages: Secondary | ICD-10-CM | POA: Diagnosis not present

## 2021-04-01 DIAGNOSIS — J9621 Acute and chronic respiratory failure with hypoxia: Secondary | ICD-10-CM | POA: Diagnosis not present

## 2021-04-01 LAB — CBC WITH DIFFERENTIAL/PLATELET
Band Neutrophils: 2 %
Basophils Relative: 1 %
Blasts: NONE SEEN %
Eosinophils Relative: 0 %
HCT: 28.1 % — ABNORMAL LOW (ref 39.0–52.0)
Hemoglobin: 8.6 g/dL — ABNORMAL LOW (ref 13.0–17.0)
Lymphocytes Relative: 5 %
MCH: 30 pg (ref 26.0–34.0)
MCHC: 30.6 g/dL (ref 30.0–36.0)
MCV: 97.9 fL (ref 80.0–100.0)
Metamyelocytes Relative: NONE SEEN %
Monocytes Relative: 4 %
Myelocytes: NONE SEEN %
Neutrophils Relative %: 88 %
Platelets: 339 10*3/uL (ref 150–400)
Promyelocytes Relative: NONE SEEN %
RBC Morphology: NORMAL
RBC: 2.87 MIL/uL — ABNORMAL LOW (ref 4.22–5.81)
RDW: 15.6 % — ABNORMAL HIGH (ref 11.5–15.5)
WBC Morphology: NORMAL
WBC: 11.3 10*3/uL — ABNORMAL HIGH (ref 4.0–10.5)
nRBC: 0.3 % — ABNORMAL HIGH (ref 0.0–0.2)
nRBC: NONE SEEN /100 WBC

## 2021-04-01 LAB — GLUCOSE, CAPILLARY
Glucose-Capillary: 198 mg/dL — ABNORMAL HIGH (ref 70–99)
Glucose-Capillary: 155 mg/dL — ABNORMAL HIGH (ref 70–99)
Glucose-Capillary: 153 mg/dL — ABNORMAL HIGH (ref 70–99)
Glucose-Capillary: 159 mg/dL — ABNORMAL HIGH (ref 70–99)

## 2021-04-01 LAB — RENAL FUNCTION PANEL
Albumin: 2.5 g/dL — ABNORMAL LOW (ref 3.5–5.0)
Anion gap: 9 (ref 5–15)
BUN: 19 mg/dL (ref 6–20)
CO2: 30 mmol/L (ref 22–32)
Calcium: 8.4 mg/dL — ABNORMAL LOW (ref 8.9–10.3)
Chloride: 99 mmol/L (ref 98–111)
Creatinine, Ser: 0.43 mg/dL — ABNORMAL LOW (ref 0.61–1.24)
GFR, Estimated: >60 mL/min (ref >60)
Glucose, Bld: 220 mg/dL — ABNORMAL HIGH (ref 70–99)
Phosphorus: 4 mg/dL (ref 2.5–4.6)
Potassium: 3.7 mmol/L (ref 3.5–5.1)
Sodium: 138 mmol/L (ref 135–145)

## 2021-04-01 LAB — MAGNESIUM: Magnesium: 1.9 mg/dL (ref 1.7–2.4)

## 2021-04-01 MED ORDER — INSULIN ASPART 100 UNIT/ML IJ SOLN
0.0000 [IU] | Freq: Every day | INTRAMUSCULAR | Status: DC
  Administered 2021-04-02: 4 [IU] via SUBCUTANEOUS
  Administered 2021-04-04: 2 [IU] via SUBCUTANEOUS

## 2021-04-01 MED ORDER — FENTANYL CITRATE (PF) 100 MCG/2ML IJ SOLN
25.0000 ug | INTRAMUSCULAR | Status: DC | PRN
Start: 2021-04-01 — End: 2021-04-03
  Administered 2021-04-02 – 2021-04-03 (×4): 25 ug via INTRAVENOUS
  Filled 2021-04-01 (×4): qty 2

## 2021-04-01 MED ORDER — INSULIN ASPART 100 UNIT/ML IJ SOLN
0.0000 [IU] | Freq: Three times a day (TID) | INTRAMUSCULAR | Status: DC
  Administered 2021-04-01 – 2021-04-02 (×4): 3 [IU] via SUBCUTANEOUS
  Administered 2021-04-02: 2 [IU] via SUBCUTANEOUS
  Administered 2021-04-03: 8 [IU] via SUBCUTANEOUS
  Administered 2021-04-03 – 2021-04-04 (×2): 3 [IU] via SUBCUTANEOUS
  Administered 2021-04-05: 8 [IU] via SUBCUTANEOUS
  Administered 2021-04-05: 5 [IU] via SUBCUTANEOUS
  Administered 2021-04-06: 3 [IU] via SUBCUTANEOUS
  Administered 2021-04-06: 5 [IU] via SUBCUTANEOUS
  Administered 2021-04-07: 3 [IU] via SUBCUTANEOUS
  Administered 2021-04-07: 5 [IU] via SUBCUTANEOUS
  Administered 2021-04-08: 3 [IU] via SUBCUTANEOUS
  Administered 2021-04-08 – 2021-04-09 (×2): 2 [IU] via SUBCUTANEOUS
  Administered 2021-04-09: 5 [IU] via SUBCUTANEOUS
  Administered 2021-04-09: 3 [IU] via SUBCUTANEOUS

## 2021-04-01 MED ORDER — PREDNISONE 20 MG PO TABS
20.0000 mg | ORAL_TABLET | Freq: Every day | ORAL | Status: DC
  Administered 2021-04-02 – 2021-04-10 (×9): 20 mg via ORAL
  Filled 2021-04-01 (×10): qty 1

## 2021-04-01 MED ORDER — INSULIN ASPART 100 UNIT/ML IJ SOLN
4.0000 [IU] | Freq: Three times a day (TID) | INTRAMUSCULAR | Status: DC
  Administered 2021-04-01 – 2021-04-09 (×17): 4 [IU] via SUBCUTANEOUS

## 2021-04-01 MED ORDER — DIAZEPAM 2 MG PO TABS
1.0000 mg | ORAL_TABLET | Freq: Three times a day (TID) | ORAL | Status: DC
  Administered 2021-04-01 – 2021-04-10 (×23): 1 mg via ORAL
  Filled 2021-04-01 (×24): qty 1

## 2021-04-01 NOTE — Progress Notes (Signed)
PROGRESS NOTE  Christopher Carter QMV:[id number] DOB: 10-Dec-1960   PCP: [name], MD  Patient is from: Home  DOA: 03/18/2021 LOS: 30  Chief complaints:  Chief Complaint  Patient presents with   Hemoptysis   Back Pain     Brief Narrative / Interim history: 60 year old M with PMH of stage IV adenocarcinoma s/p RUL lobectomy with node dissection on 5/16, recurrent hemoptysis, chronic RF on 5 L, tobacco abuse, HTN, depression, loculated hemithorax treated with IR chest tube placement, empiric antibiotics and prednisone taper and discharged to follow-up with CTS and radiation oncology.  He returned with cough with hemoptysis, right upper back pain, and increased oxygen requirement.  CT chest showed airspace disease in right lung representing alveolar hemorrhage with recurrence of apical fluid collection, nodular masslike lesion concerning for recurrence of malignancy and right peritracheal adenopathy.  He was admitted to [facility].  CTS, pulmonary IR and palliative consulted.  Started on IV cefepime, systemic steroid and tranexamic acid nebulization.  He underwent bronchoscopy that did not reveal active bleeding.  Hemoptysis seems to have resolved.  Started on PCA Dilaudid by PMT for pain control, and transferred to [facility] for radiation oncology care.  Main issue is pain control.   Subjective: Seen and examined earlier this morning.  He had hemoptysis overnight and this morning.  There is frank red blood on tissues at bedside but not large volume.  No blood clots.  Pain fairly controlled.  Denies chest pain.   Objective: Vitals:   04/01/21 0352 04/01/21 0509 04/01/21 0746 04/01/21 0843  BP:  127/81    Pulse:  100    Resp: 20 18 (!) 21   Temp:  98 F (36.7 C)    TempSrc:  Oral    SpO2: 95% 98% 97% 96%  Weight:      Height:        Intake/Output Summary (Last 24 hours) at 04/01/2021 1202 Last data filed at 04/01/2021 0700 Gross per 24 hour  Intake 5.59 ml  Output 301 ml  Net  -295.41 ml   Filed Weights   03/18/21 0240 03/18/21 0242 03/25/21 1542  Weight: 78.9 kg 79.4 kg 81.7 kg    Examination:  GENERAL: No apparent distress.  Nontoxic. HEENT: MMM.  Vision and hearing grossly intact.  NECK: Supple.  No apparent JVD.  RESP: 96% on 1 L.  No IWOB.  Fair aeration bilaterally. CVS:  RRR. Heart sounds normal.  ABD/GI/GU: BS+. Abd soft, NTND.  MSK/EXT:  Moves extremities. No apparent deformity. No edema.  SKIN: no apparent skin lesion or wound NEURO: Awake and alert. Oriented appropriately.  No apparent focal neuro deficit. PSYCH: Calm. Normal affect.   Procedures:  7/11-bronchoscopy  Microbiology summarized: [id number] and influenza PCR nonreactive. MRSA PCR screen negative. 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 s/p RUL lobectomy and node dissection by Dr. Koleen Nimrod on 5/16 Acute on chronic respiratory failure with hypoxia-on 4 L at baseline. -7/10-CTA chest concerning for alveolar hemorrhage, loculated fluid at right lung apex lined by masslike nodularity consistent with recurrence of malignancy and right peritracheal LAD -7/19-repeat CTA chest without significant change, may be improved hemorrhage -Respiratory culture with normal respiratory flora -Bronchoscopy on 7/11-no active hemorrhage -Completed 7 days of IV cefepime on 7/18.  Started on Augmentin on 7/22 for dental infection -Rad/onc-started radiation on 7/13>>> reportedly planned for  6 and half week -Oncology to arrange outpatient follow-up for systemic chemotherapy after radiation therapy -Wean oxygen to keep saturation above 92%.  Discussed with RN. -Some hemoptysis with low volume but bright red blood on tissues overnight and this morning again.  PCCM resumed prednisone 20 mg daily -Pain control as below.    Uncontrolled  cancer-related pain: Rates his pain 6/10 today -Palliative medicine managing  -Fentanyl patch increased to 100 mcg on 7/23. -Started MS IR 7.5 mg every 4 hours as needed -Fentanyl PCA -Celebrex and gabapentin. -On scheduled and as needed Valium for anxiety -Narcan as needed   Leukocytosis: Likely demargination from steroid.  Improved after stopping steroid -Continue monitoring   Dental pain/possible infection: Was on penicillin V outpatient.  Plan for root canal treatment on 7/11 but postponed.  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 Recent Labs    03/20/21 0407 03/21/21 0057 03/22/21 0212 03/23/21 0532 03/24/21 0224 03/25/21 0357 03/27/21 0749 03/28/21 0335 03/30/21 0339 04/01/21 0353  HGB 8.8* 8.5* 8.5* 8.6* 8.1* 8.3* 9.2* 9.0* 9.2* 8.6*  -Monitor H&H    Essential hypertension: Normotensive. -Continue home amlodipine, irbesartan.  Controlled DM-2 with hyperglycemia: A1c 6.5%.  Improved after he came off steroid but back on steroid again. Recent Labs  Lab 03/31/21 1126 03/31/21 1617 03/31/21 2146 04/01/21 0748 04/01/21 1100  GLUCAP 142* 85 231* 198* 155*  -Changed to SSI moderate -NovoLog 4 units 3 times daily with meals -Lantus 10 units twice daily -May adjust based on his evening CBG -Further adjustment as appropriate    History of anxiety/depression:  -On Seroquel, Zoloft and Valium   GERD: -On  Protonix     Body mass index is 25.84 kg/m. Nutrition Problem: Increased nutrient needs Etiology: cancer and cancer related treatments Signs/Symptoms: estimated needs Interventions: Ensure Enlive (each supplement provides 350kcal and 20 grams of protein), MVI   DVT prophylaxis:  Place and maintain sequential  compression device Start: 03/30/21 0804 Place and maintain sequential compression device Start: 03/29/21 0835 SCDs Start: 03/18/21 1527  Code Status: Full code Family Communication: Patient and/or RN. None at bedside today Level of care: Progressive Status is: Inpatient  Remains inpatient appropriate because:Ongoing active pain requiring inpatient pain management, IV treatments appropriate due to intensity of illness or inability to take PO, and Inpatient level of care appropriate due to severity of illness  Dispo: The patient is from: Home              Anticipated d/c is to: Home              Patient currently is not medically stable to d/c.   Difficult to place patient No       Consultants:  PCCM Cardiothoracic surgery-signed off IR-signed off Palliative medicine Oncology Radiation oncology Dental surgery   Sch Meds:  Scheduled Meds:  (feeding supplement) PROSource Plus  30 mL Oral BID BM   albuterol  2.5 mg Nebulization Q6H   amLODipine  10 mg Oral Daily   amoxicillin-clavulanate  1 tablet Oral Q12H   azithromycin  250 mg Oral Daily   benzonatate  200 mg Oral BID   celecoxib  200 mg Oral BID   diazepam  2 mg Oral Q8H   feeding supplement  237 mL Oral BID BM   fentaNYL  1 patch Transdermal Q72H   fentaNYL   Intravenous Q4H   ferrous sulfate  325 mg Oral Q breakfast   gabapentin  300 mg Oral BID WC   gabapentin  600 mg Oral QHS   guaiFENesin  600 mg Oral BID   insulin aspart  0-5 Units Subcutaneous QHS   insulin aspart  0-9 Units Subcutaneous TID WC   insulin glargine  10 Units Subcutaneous BID   irbesartan  300 mg Oral Daily   latanoprost  1 drop Both Eyes QHS   lidocaine  1 patch Transdermal Q24H   multivitamin with minerals  1 tablet Oral Daily   pantoprazole  40 mg Oral Daily   [START ON 04/02/2021] predniSONE  20 mg Oral Q breakfast   QUEtiapine  100 mg Oral QHS   sertraline  100 mg Oral Daily   sodium chloride flush  3 mL Intravenous Q12H    Continuous Infusions:  tranexamic acid     PRN Meds:.acetaminophen, albuterol, diazepam, diphenhydrAMINE **OR** diphenhydrAMINE, fentaNYL (SUBLIMAZE) injection, fentaNYL (SUBLIMAZE) injection, morphine, naloxone **AND** sodium chloride flush, ondansetron **OR** ondansetron (ZOFRAN) IV, ondansetron (ZOFRAN) IV, polyethylene glycol  Antimicrobials: Anti-infectives (From admission, onward)    Start     Dose/Rate Route Frequency Ordered Stop   03/30/21 1130  amoxicillin-clavulanate (AUGMENTIN) 875-125 MG per tablet 1 tablet        1 tablet Oral Every 12 hours 03/30/21 1035 04/06/21 0959   03/29/21 1200  azithromycin (ZITHROMAX) tablet 250 mg       Note to Pharmacy: For dental infection   250 mg Oral Daily 03/29/21 1100     03/19/21 0900  vancomycin (VANCOREADY) IVPB 1250 mg/250 mL  Status:  Discontinued        1,250 mg 166.7 mL/hr over 90 Minutes Intravenous Every 12 hours 03/18/21 1842 03/20/21 1059   03/18/21 2200  ceFEPIme (MAXIPIME) 2 g in sodium chloride 0.9 % 100 mL IVPB        2 g 200 mL/hr over 30 Minutes Intravenous Every 8 hours 03/18/21 1836 03/26/21 0835   03/18/21 1930  vancomycin (VANCOREADY) IVPB 1500 mg/300 mL        1,500 mg 150 mL/hr over 120 Minutes Intravenous  Once 03/18/21 1836 03/19/21 0734   03/18/21 1915  azithromycin (ZITHROMAX) 500 mg in sodium chloride 0.9 % 250 mL IVPB  Status:  Discontinued        500 mg 250 mL/hr over 60 Minutes Intravenous Every 24 hours 03/18/21 1822 03/19/21 0840   03/18/21 1800  penicillin v potassium (VEETID) tablet 500 mg  Status:  Discontinued       Note to Pharmacy: Start date : 03/14/21     500 mg Oral 4 times daily 03/18/21 1527 03/18/21 1842        I have personally reviewed the following labs and images: CBC: Recent Labs  Lab 03/27/21 0749 03/28/21 0335 03/30/21 0339 04/01/21 0353  WBC 20.4* 18.7* 21.8* 11.3*  HGB 9.2* 9.0* 9.2* 8.6*  HCT 29.2* 28.6* 28.9* 28.1*  MCV 95.7 95.3 97.0 97.9  PLT 467* 467* 427* 339    BMP &GFR Recent Labs  Lab 03/27/21 0749 03/28/21 0335 04/01/21 0353  NA 139 138 138  K 4.1 4.4 3.7  CL 100 99 99  CO2 29 29 30   GLUCOSE 238* 288* 220*  BUN 13 13 19   CREATININE 0.42* 0.40* 0.43*  CALCIUM 9.0 8.9 8.4*  MG 2.0 2.0 1.9  PHOS 2.9 2.7 4.0   Estimated Creatinine Clearance:  103.9 mL/min (A) (by C-G formula based on SCr of 0.43 mg/dL (L)). Liver & Pancreas: Recent Labs  Lab 03/27/21 0749 03/28/21 0335 04/01/21 0353  ALBUMIN 2.6* 2.6* 2.5*   No results for input(s): LIPASE, AMYLASE in the last 168 hours. No results for input(s): AMMONIA in the last 168 hours. Diabetic: No results for input(s): HGBA1C in the last 72 hours. Recent Labs  Lab 03/31/21 1126 03/31/21 1617 03/31/21 2146 04/01/21 0748 04/01/21 1100  GLUCAP 142* 85 231* 198* 155*   Cardiac Enzymes: No results for input(s): CKTOTAL, CKMB, CKMBINDEX, TROPONINI in the last 168 hours. No results for input(s): PROBNP in the last 8760 hours. Coagulation Profile: No results for input(s): INR, PROTIME in the last 168 hours. Thyroid Function Tests: No results for input(s): TSH, T4TOTAL, FREET4, T3FREE, THYROIDAB in the last 72 hours. Lipid Profile: No results for input(s): CHOL, HDL, LDLCALC, TRIG, CHOLHDL, LDLDIRECT in the last 72 hours. Anemia Panel: No results for input(s): VITAMINB12, FOLATE, FERRITIN, TIBC, IRON, RETICCTPCT in the last 72 hours. Urine analysis:    Component Value Date/Time   COLORURINE YELLOW 01/18/2021 1500   APPEARANCEUR CLEAR 01/18/2021 1500   LABSPEC 1.006 01/18/2021 1500   PHURINE 6.0 01/18/2021 1500   GLUCOSEU NEGATIVE 01/18/2021 1500   HGBUR SMALL (A) 01/18/2021 1500   BILIRUBINUR NEGATIVE 01/18/2021 1500   KETONESUR NEGATIVE 01/18/2021 1500   PROTEINUR NEGATIVE 01/18/2021 1500   NITRITE NEGATIVE 01/18/2021 1500   LEUKOCYTESUR NEGATIVE 01/18/2021 1500   Sepsis Labs: Invalid input(s): PROCALCITONIN, [place]  Microbiology: No results found for this or any  previous visit (from the past 240 hour(s)).   Radiology Studies: No results found.    Arzu Mcgaughey T. Durand  If 7PM-7AM, please contact night-coverage [email or web address] 04/01/2021, 12:02 PM

## 2021-04-01 NOTE — Progress Notes (Addendum)
Physical Therapy Treatment Patient Details Name: Christopher Carter MRN: [id number] DOB: 1961-05-12 Today's Date: 04/01/2021    History of Present Illness 60 y.o. male who presented to [place] [facility] for recurrent hemoptysis. Dx of intra alveolar hemorrhage, acute on chronic respiratory failure.  Pt with a PMH significant for Stage IV adenocarcinoma now S/P right upper lobectomy with node dissection 01/22/2021, on 5L [place] at baseline, tobacco abuse, HTN, and depression    PT Comments    Pt progressing with PT. Incr gait/activity tolerance, HR max 133 with amb, SpO2=92-97% on 3L. Continue to follow in acute setting  Follow Up Recommendations  OPPT vs no f/u     Equipment Recommendations  None recommended by PT    Recommendations for Other Services       Precautions / Restrictions Precautions Precautions: Fall Restrictions Weight Bearing Restrictions: No    Mobility  Bed Mobility Overal bed mobility: Needs Assistance Bed Mobility: Supine to Sit;Sit to Supine     Supine to sit: Modified independent (Device/Increase time) Sit to supine: Modified independent (Device/Increase time)        Transfers   Equipment used: None Transfers: Sit to/from Stand Sit to Stand: Supervision         General transfer comment: decr reliance in UEs, supervision for lines and safety  Ambulation/Gait Ambulation/Gait assistance: Supervision Gait Distance (Feet): 320 Feet Assistive device: IV Pole Gait Pattern/deviations: Step-through pattern;Decreased stride length;Wide base of support Gait velocity: decr   General Gait Details: supervision for safety with use of IV pole. no LOB however 4 standing rest d/t fatigue and  R hip low back pain. pt performs repeated standing stretches. single UE support on IV pole for stability. HR max 133, SpO2=97%   Stairs             Wheelchair Mobility    Modified Rankin (Stroke Patients Only)       Balance   Sitting-balance  support: Feet supported Sitting balance-Leahy Scale: Good     Standing balance support: Single extremity supported;No upper extremity supported Standing balance-Leahy Scale: Fair Standing balance comment: able to maintain static standing, use of UE support on IV pole for dynamic tasks                            Cognition Arousal/Alertness: Awake/alert Behavior During Therapy: WFL for tasks assessed/performed Overall Cognitive Status: Within Functional Limits for tasks assessed                                        Exercises Other Exercises Other Exercises: instructed in R low back, pirformis stretch    General Comments        Pertinent Vitals/Pain Pain Assessment: Faces Pain Score: 5  Pain Location: right hip/glut/low back Pain Descriptors / Indicators: Discomfort;Grimacing Pain Intervention(s): Limited activity within patient's tolerance;Monitored during session;Repositioned;Patient requesting pain meds-RN notified    Home Living                      Prior Function            PT Goals (current goals can now be found in the care plan section) Acute Rehab PT Goals Patient Stated Goal: Get moving more and have less pain in R arm PT Goal Formulation: With patient/family Time For Goal Achievement: 04/13/21 Potential to Achieve Goals: Good Progress  towards PT goals: Progressing toward goals    Frequency    Min 3X/week      PT Plan Current plan remains appropriate    Co-evaluation              AM-PAC PT "6 Clicks" Mobility   Outcome Measure  Help needed turning from your back to your side while in a flat bed without using bedrails?: None Help needed moving from lying on your back to sitting on the side of a flat bed without using bedrails?: None Help needed moving to and from a bed to a chair (including a wheelchair)?: A Little Help needed standing up from a chair using your arms (e.g., wheelchair or bedside chair)?:  A Little Help needed to walk in hospital room?: A Little Help needed climbing 3-5 steps with a railing? : A Little 6 Click Score: 20    End of Session Equipment Utilized During Treatment: Gait belt Activity Tolerance: Patient tolerated treatment well Patient left: in bed;with call bell/phone within reach Nurse Communication: Mobility status PT Visit Diagnosis: Unsteadiness on feet (R26.81);Muscle weakness (generalized) (M62.81)     Time: 2458-0998 PT Time Calculation (min) (ACUTE ONLY): 31 min  Charges:  $Gait Training: 23-37 mins                     Baxter Flattery, PT  Acute Rehab Dept ([place]) [phone] Pager [phone]  04/01/2021    [facility] 04/01/2021, 11:00 AM

## 2021-04-01 NOTE — Progress Notes (Signed)
   NAME:  Christopher Carter, MRN:  [id number], DOB:  12-Nov-1960, LOS: 50 ADMISSION DATE:  03/18/2021, CONSULTATION DATE:  03/18/2021 REFERRING MD:  Dr. Tamala Julian, CHIEF COMPLAINT:  Hemoptysis    History of Present Illness:  Christopher Carter is a 60 y.o. male with a PMH significant for Stage IV adenocarcinoma now S/P right upper lobectomy with node dissection 01/22/2021, on 5L [place] at baseline, tobacco abuse, HTN, and depression who presented to [place] [facility] for recurrent hemoptysis. Patient has been treated at this facility for similar presentation and underwent video bronchoscopy and IR placed chest tube for pleural effusion with Dr. Roxan Hockey. He was discharged in stable condition with plans to follow up with cardiothoracic surgery and oncology.  He presented this admission with recurrent hemoptysis that started 4hr prior to admission with associated right upper back pain that radiates to upper neck. Patient was transferred from [place] to [facility] for cardiothoracic, pulmonary, and IR consults. Vital signs stable, WBC elevated at 18.1. CT chest was obtained and consistent with alveolar hemorrhage localized to the right with right apical fluid collection.   Pertinent  Medical History  Stage IIIa adenocarcinoma now S/P right upper lobectomy with node dissection 01/22/2021, on 5L [place] at baseline, tobacco abuse, HTN, and depression   Significant Hospital Events:  7/10 admitted with recurrent hemoptysis  7/11 bronch- nonfocal source, had bloody frothy secretions in all airways, petechiae on bronchi Receiving radiation therapy Pain is better controlled  Interim History / Subjective:  Pain management is better Still did have some bright red blood hemoptysis today  Objective   Blood pressure 127/81, pulse 100, temperature 98 F (36.7 C), temperature source Oral, resp. rate (!) 21, height 5\' 10"  (1.778 m), weight 81.7 kg, SpO2 96 %.    FiO2 (%):  [36 %] 36 %   Intake/Output Summary  (Last 24 hours) at 04/01/2021 1210 Last data filed at 04/01/2021 0700 Gross per 24 hour  Intake 5.59 ml  Output 301 ml  Net -295.41 ml   Filed Weights   03/18/21 0240 03/18/21 0242 03/25/21 1542  Weight: 78.9 kg 79.4 kg 81.7 kg    Examination: HEENT: Moist oral mucosa Neuro: Weak and interactive CV: S1-S2 appreciated PULM: Decreased air movement bilaterally GI: Bowel sounds appreciated   Labs reviewed -Chem-7 within normal limits CBC within normal limits-mild anemia  CT scan of the chest 03/18/2021 reviewed by myself showing alveolar interstitial process  Repeat CT scan of the chest is stable 03/27/2021 Resolved Hospital Problem list     Assessment & Plan:  Acute on chronic hypoxemic respiratory failure Hemoptysis -Small amount of hemoptysis -Was off steroids for couple of days -Reinitiate prednisone at 20 daily -Alveolitis was improving on last CT  Stage IV adenocarcinoma of the lung s/p right upper lobectomy -Needed fluid in right upper chest with thick pleural rind  Persistent pain and discomfort -Improved pain control at present  Discussed with Dr. [facility]   Diet/type: Regular consistency (see orders) DVT prophylaxis: SCD GI prophylaxis: N/A Lines: N/A Foley:  N/A Code Status:  full code Last date of multidisciplinary goals of care discussion: Per primary   Sherrilyn Rist, MD [place] PCCM Pager: See Shea Evans

## 2021-04-01 NOTE — Progress Notes (Signed)
Pt c/o SOB decreased O2 sat 85% on 1 liter, coughing up blood tinged sputum. Increased O2 to 3 liters humidified O2, will continue to monitor. SRP, RN

## 2021-04-01 NOTE — Progress Notes (Signed)
Daily Progress Note   Patient Name: Christopher Carter       Date: 04/01/2021 DOB: 1961/07/19  Age: 60 y.o. MRN#: [id number] Attending Physician: Mercy Riding, MD Primary Care Physician: [name], MD Admit Date: 03/18/2021  Reason for Consultation/Follow-up: Pain control  Subjective: Christopher Carter had some hemoptysis today, he is resting in bed, he rested well overnight, pain is well controlled, he is able to interact with me and speaks in full sentences, how ever does doze off and then awakens completely. He states that he likes to take a nap in the afternoon. He asks when he will be able to go home. His wife has gone grocery shopping he says.   PCA pump was interrogated. Patient has not used any boluses since the past several hours. He has used total of 150 mcg since the start of this shift. He does cite good relief from the PO Morphine pill, he was able to ambulate around the floor earlier today, states that the pain in his hip is manageable.     Length of Stay: 13  Current Medications: Scheduled Meds:   (feeding supplement) PROSource Plus  30 mL Oral BID BM   albuterol  2.5 mg Nebulization Q6H   amLODipine  10 mg Oral Daily   amoxicillin-clavulanate  1 tablet Oral Q12H   azithromycin  250 mg Oral Daily   benzonatate  200 mg Oral BID   celecoxib  200 mg Oral BID   diazepam  1 mg Oral Q8H   feeding supplement  237 mL Oral BID BM   fentaNYL  1 patch Transdermal Q72H   ferrous sulfate  325 mg Oral Q breakfast   gabapentin  300 mg Oral BID WC   gabapentin  600 mg Oral QHS   guaiFENesin  600 mg Oral BID   insulin aspart  0-15 Units Subcutaneous TID WC   insulin aspart  0-5 Units Subcutaneous QHS   insulin aspart  4 Units Subcutaneous TID WC   insulin glargine  10 Units  Subcutaneous BID   irbesartan  300 mg Oral Daily   latanoprost  1 drop Both Eyes QHS   lidocaine  1 patch Transdermal Q24H   multivitamin with minerals  1 tablet Oral Daily   pantoprazole  40 mg Oral Daily   [START ON 04/02/2021]  predniSONE  20 mg Oral Q breakfast   QUEtiapine  100 mg Oral QHS   sertraline  100 mg Oral Daily   sodium chloride flush  3 mL Intravenous Q12H    Continuous Infusions:  tranexamic acid      PRN Meds: acetaminophen, albuterol, diazepam, diphenhydrAMINE **OR** diphenhydrAMINE, fentaNYL (SUBLIMAZE) injection, fentaNYL (SUBLIMAZE) injection, morphine, naloxone **AND** sodium chloride flush, ondansetron **OR** ondansetron (ZOFRAN) IV, ondansetron (ZOFRAN) IV, polyethylene glycol  Physical Exam         Awakens and interacts easily, resting in bed Regular work of breathing No acute distress S1-S2 Abdomen is not tender No edema No focal deficits  Vital Signs: BP 112/67 (BP Location: Right Arm)   Pulse (!) 110   Temp 98 F (36.7 C) (Oral)   Resp 20   Ht 5\' 10"  (1.778 m)   Wt 81.7 kg   SpO2 94%   BMI 25.84 kg/m  SpO2: SpO2: 94 % O2 Device: O2 Device: Nasal Cannula O2 Flow Rate: O2 Flow Rate (L/min): 1 L/min  Intake/output summary:  Intake/Output Summary (Last 24 hours) at 04/01/2021 1532 Last data filed at 04/01/2021 0700 Gross per 24 hour  Intake --  Output 301 ml  Net -301 ml    LBM: Last BM Date: 04/01/21 Baseline Weight: Weight: 78.9 kg Most recent weight: Weight: 81.7 kg       Palliative Assessment/Data: Palliative performance scale 50%     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   Acute on chronic respiratory failure with hypoxia  (HCC) 02/26/2021   Malignant neoplasm of right upper lobe of lung ([facility]) 02/26/2021   Pneumonia of right lung due to infectious organism 02/26/2021   Sepsis due to pneumonia ([facility]) 02/26/2021   Major depressive disorder 02/26/2021   S/P lobectomy of lung 01/22/2021   Essential hypertension 01/15/2021   Mass of upper lobe of right lung 12/22/2020   Hemoptysis 12/22/2020    Palliative Care Assessment & Plan   Patient Profile:    Assessment: 60 year old gentleman with history of 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  History of loculated hemithorax treated with IR chest tube placement empiric antibiotics prednisone taper.  Patient admitted to hospital medicine service because CT chest showing airspace disease in right lung representing alveolar hemorrhage, nodular masslike lesion concerning for recurrence of malignancy as well as right peritracheal adenopathy.  Started on IV antibiotic steroids and tranexamic acid nebulization.  Bronchoscopy did not reveal active bleeding.  Hemoptysis is now resolved.  Palliative medicine team has been consulted and is following for pain management.  Recommendations/Plan: Medication history reviewed.  Chart reviewed.  Discussed with bedside nurses as well as with the patient. Plan: D/C bolus only fentanyl PCA, patient states he is ready.  Have IV fentanyl 25 mcg available Q 1 hour PRN for rescue/breakthrough pain that is not controlled with PO MSIR.  Continue PO MSIR at 7.5 mg Q 4 hours PRN and monitor. Patient has taken 2 doses today.  Will decrease Valium from 2 mg to 1 mg PO Q 8 hours which is scheduled, this should be held for  sedation.  To remain on trans dermal Fentanyl 100 mcg patch, change Q 72 hours.        Code Status:    Code Status Orders  (From admission, onward)            Start     Ordered   03/18/21 1527  Full code  Continuous        03/18/21 1527           Code Status History     Date Active Date Inactive Code Status Order ID Comments User Context   02/26/2021 0613 03/07/2021 1843 Full Code [id number]  Vernelle Emerald, MD ED   01/22/2021 1529 01/26/2021 1518 Full Code [id number]  Elgie Collard, PA-C Inpatient       Prognosis:  Unable to determine  Discharge Planning: To Be Determined  Care plan was discussed with  patient and RN.   Thank you for allowing the Palliative Medicine Team to assist in the care of this patient.   Time In: 1500 Time Out: 1535 Total Time 35 Prolonged Time Billed  no       Greater than 50%  of this time was spent counseling and coordinating care related to the above assessment and plan.  Loistine Chance, MD  Please contact Palliative Medicine Team phone at [phone] for questions and concerns.

## 2021-04-02 ENCOUNTER — Inpatient Hospital Stay (HOSPITAL_COMMUNITY): Payer: 59

## 2021-04-02 ENCOUNTER — Encounter (HOSPITAL_COMMUNITY): Payer: Self-pay | Admitting: Internal Medicine

## 2021-04-02 ENCOUNTER — Encounter (HOSPITAL_COMMUNITY): Payer: 59

## 2021-04-02 ENCOUNTER — Ambulatory Visit: Payer: 59

## 2021-04-02 DIAGNOSIS — D72829 Elevated white blood cell count, unspecified: Secondary | ICD-10-CM | POA: Diagnosis not present

## 2021-04-02 DIAGNOSIS — J9621 Acute and chronic respiratory failure with hypoxia: Secondary | ICD-10-CM | POA: Diagnosis not present

## 2021-04-02 DIAGNOSIS — R042 Hemoptysis: Secondary | ICD-10-CM

## 2021-04-02 DIAGNOSIS — I10 Essential (primary) hypertension: Secondary | ICD-10-CM | POA: Diagnosis not present

## 2021-04-02 DIAGNOSIS — R0489 Hemorrhage from other sites in respiratory passages: Secondary | ICD-10-CM | POA: Diagnosis not present

## 2021-04-02 DIAGNOSIS — A419 Sepsis, unspecified organism: Secondary | ICD-10-CM

## 2021-04-02 DIAGNOSIS — R6520 Severe sepsis without septic shock: Secondary | ICD-10-CM

## 2021-04-02 LAB — CBC WITH DIFFERENTIAL/PLATELET
Abs Immature Granulocytes: 0.5 10*3/uL — ABNORMAL HIGH (ref 0.00–0.07)
Basophils Absolute: 0 10*3/uL (ref 0.0–0.1)
Basophils Relative: 0 %
Eosinophils Absolute: 0.1 10*3/uL (ref 0.0–0.5)
Eosinophils Relative: 1 %
HCT: 31.4 % — ABNORMAL LOW (ref 39.0–52.0)
Hemoglobin: 9.9 g/dL — ABNORMAL LOW (ref 13.0–17.0)
Immature Granulocytes: 4 %
Lymphocytes Relative: 3 %
Lymphs Abs: 0.5 10*3/uL — ABNORMAL LOW (ref 0.7–4.0)
MCH: 29.6 pg (ref 26.0–34.0)
MCHC: 31.5 g/dL (ref 30.0–36.0)
MCV: 94 fL (ref 80.0–100.0)
Monocytes Absolute: 0.4 10*3/uL (ref 0.1–1.0)
Monocytes Relative: 3 %
Neutro Abs: 12.6 10*3/uL — ABNORMAL HIGH (ref 1.7–7.7)
Neutrophils Relative %: 89 %
Platelets: 296 10*3/uL (ref 150–400)
RBC: 3.34 MIL/uL — ABNORMAL LOW (ref 4.22–5.81)
RDW: 15.8 % — ABNORMAL HIGH (ref 11.5–15.5)
WBC: 14 10*3/uL — ABNORMAL HIGH (ref 4.0–10.5)
nRBC: 0.1 % (ref 0.0–0.2)

## 2021-04-02 LAB — COMPREHENSIVE METABOLIC PANEL
ALT: 31 U/L (ref 0–44)
AST: 46 U/L — ABNORMAL HIGH (ref 15–41)
Albumin: 2.7 g/dL — ABNORMAL LOW (ref 3.5–5.0)
Alkaline Phosphatase: 86 U/L (ref 38–126)
Anion gap: 10 (ref 5–15)
BUN: 16 mg/dL (ref 6–20)
CO2: 28 mmol/L (ref 22–32)
Calcium: 8.6 mg/dL — ABNORMAL LOW (ref 8.9–10.3)
Chloride: 96 mmol/L — ABNORMAL LOW (ref 98–111)
Creatinine, Ser: 0.69 mg/dL (ref 0.61–1.24)
GFR, Estimated: >60 mL/min (ref >60)
Glucose, Bld: 133 mg/dL — ABNORMAL HIGH (ref 70–99)
Potassium: 5.5 mmol/L — ABNORMAL HIGH (ref 3.5–5.1)
Sodium: 134 mmol/L — ABNORMAL LOW (ref 135–145)
Total Bilirubin: 1.3 mg/dL — ABNORMAL HIGH (ref 0.3–1.2)
Total Protein: 6.7 g/dL (ref 6.5–8.1)

## 2021-04-02 LAB — GLUCOSE, CAPILLARY
Glucose-Capillary: 114 mg/dL — ABNORMAL HIGH (ref 70–99)
Glucose-Capillary: 130 mg/dL — ABNORMAL HIGH (ref 70–99)
Glucose-Capillary: 136 mg/dL — ABNORMAL HIGH (ref 70–99)
Glucose-Capillary: 160 mg/dL — ABNORMAL HIGH (ref 70–99)
Glucose-Capillary: 167 mg/dL — ABNORMAL HIGH (ref 70–99)
Glucose-Capillary: 313 mg/dL — ABNORMAL HIGH (ref 70–99)

## 2021-04-02 LAB — MRSA NEXT GEN BY PCR, NASAL: MRSA by PCR Next Gen: NOT DETECTED

## 2021-04-02 LAB — LACTIC ACID, PLASMA
Lactic Acid, Venous: 1.8 mmol/L (ref 0.5–1.9)
Lactic Acid, Venous: 1.4 mmol/L (ref 0.5–1.9)

## 2021-04-02 LAB — POTASSIUM: Potassium: 4 mmol/L (ref 3.5–5.1)

## 2021-04-02 IMAGING — DX DG CHEST 1V PORT
1 series · 1 of 1 positions shown · non-contrast
Comparison: [DATE]

CLINICAL DATA: Shortness of breath and tachycardia.

EXAM:
PORTABLE CHEST 1 VIEW

[chest ap]
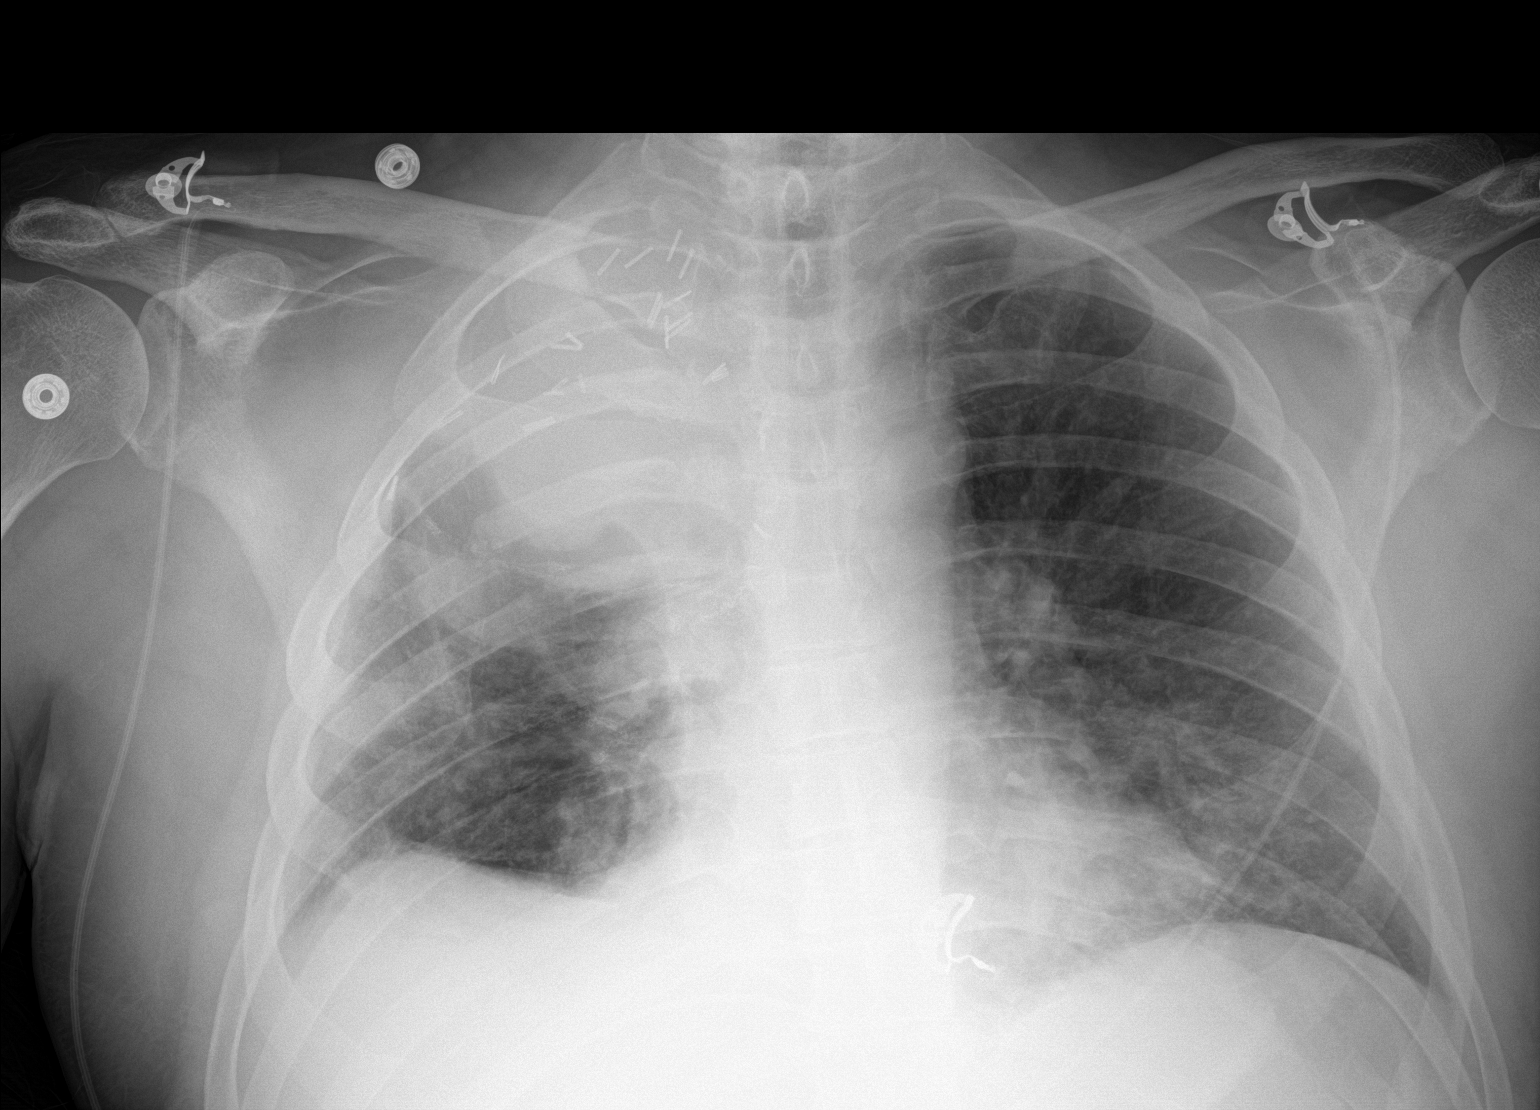

[1 of 1 positions shown; findings below may reference images not displayed]

FINDINGS: Stable cardiomediastinal silhouette. Right upper lobe opacity is
again noted corresponding to loculated pleural fluid and recurrent
tumor as noted on recent chest CT from [DATE]. No pleural
effusion or edema. New airspace opacity identified within the left
lower lobe. The visualized osseous structures are unremarkable.
IMPRESSION: 1. New left lower lobe airspace opacity concerning for pneumonia.
2. Stable right upper lobe opacity corresponding to loculated
pleural fluid and recurrent tumor.

## 2021-04-02 IMAGING — CT CT ANGIO CHEST
2 of 7 series · 17 of 46 positions shown · IV contrast (OMNIPAQUE)
Comparison: Chest CT [DATE]

CLINICAL DATA: Shortness of breath and cough. History of pulmonary
adenocarcinoma with right upper lobectomy. Undergoing chemotherapy

EXAM:
CT ANGIOGRAPHY CHEST WITH CONTRAST
TECHNIQUE: Multidetector CT imaging of the chest was performed using the
standard protocol during bolus administration of intravenous
contrast. Multiplanar CT image reconstructions and MIPs were
obtained to evaluate the vascular anatomy.
CONTRAST:  100mL OMNIPAQUE IOHEXOL 350 MG/ML SOLN

[Series 6: thins · axial · 0.90mm/px · z∈[-432,-169]mm · 15 of 301 slices shown]
[im 19/301  lung]
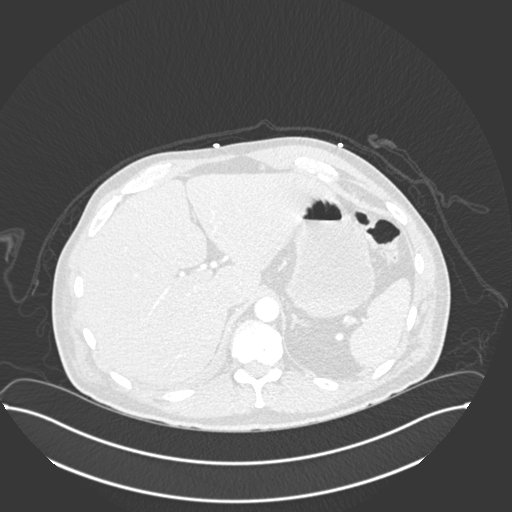
[im 38/301  soft-tissue]
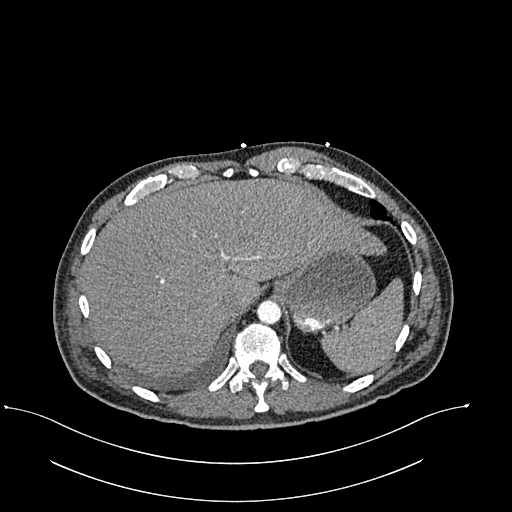
[im 57/301  lung]
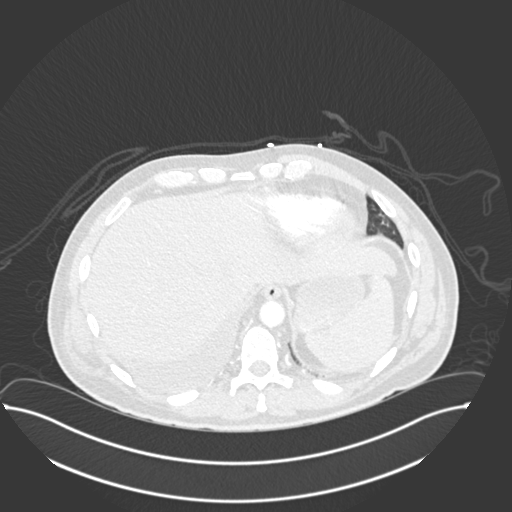
[im 76/301  soft-tissue]
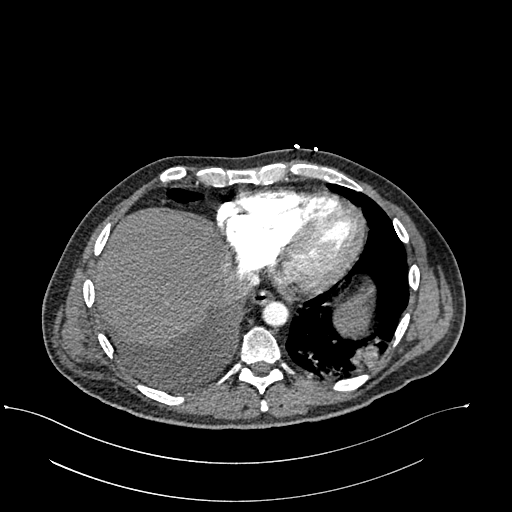
[im 94/301  lung]
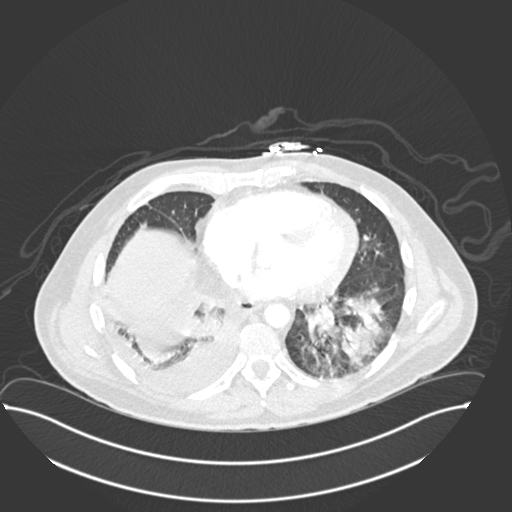
[im 113/301  soft-tissue]
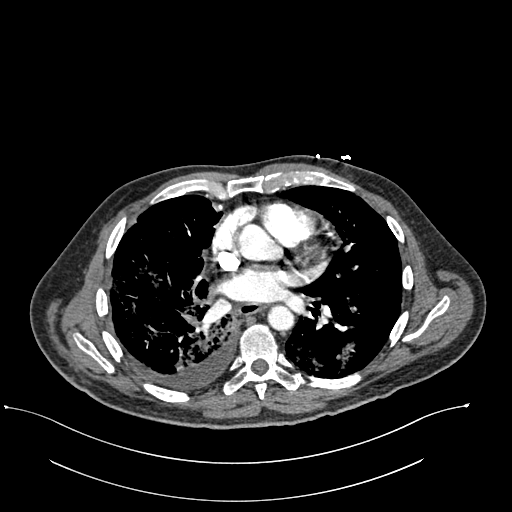
[im 132/301  lung]
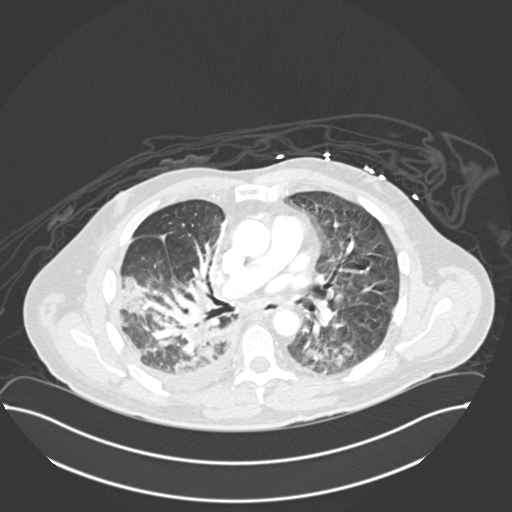
[im 151/301  soft-tissue]
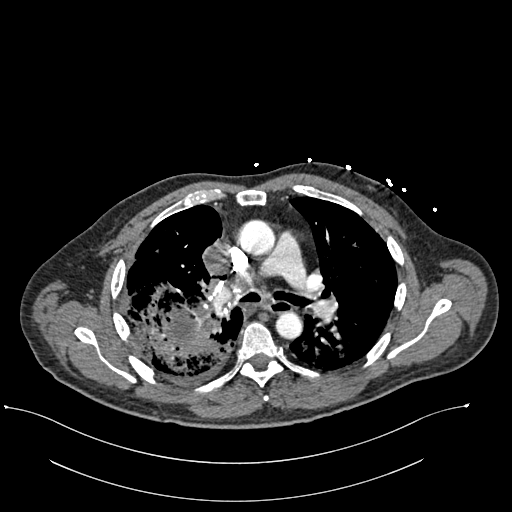
[im 169/301  lung]
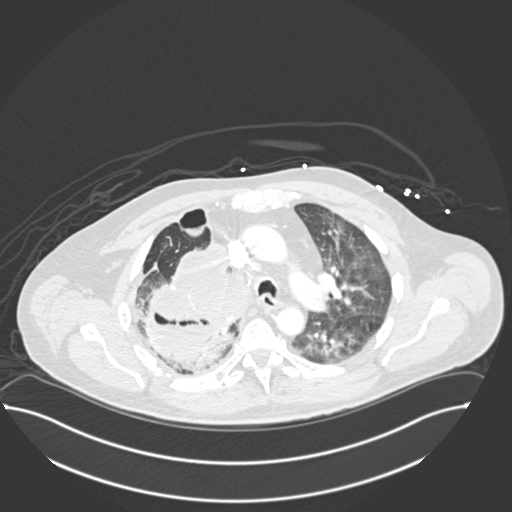
[im 188/301  soft-tissue]
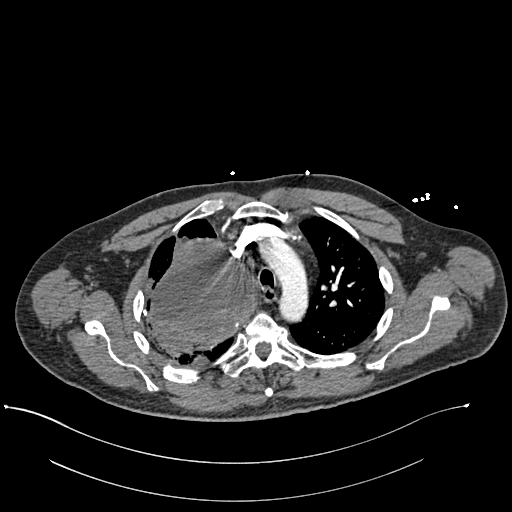
[im 207/301  lung]
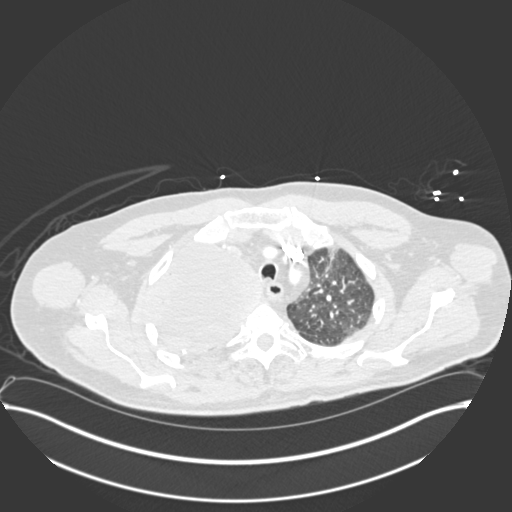
[im 226/301  soft-tissue]
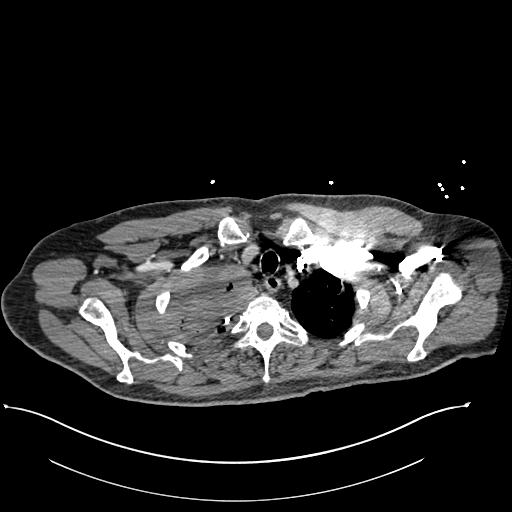
[im 244/301  lung]
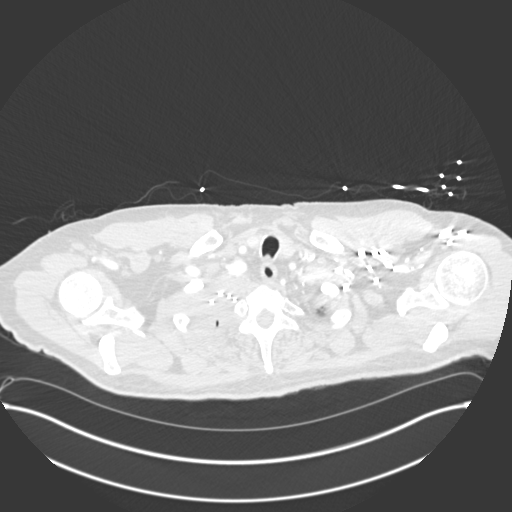
[im 263/301  soft-tissue]
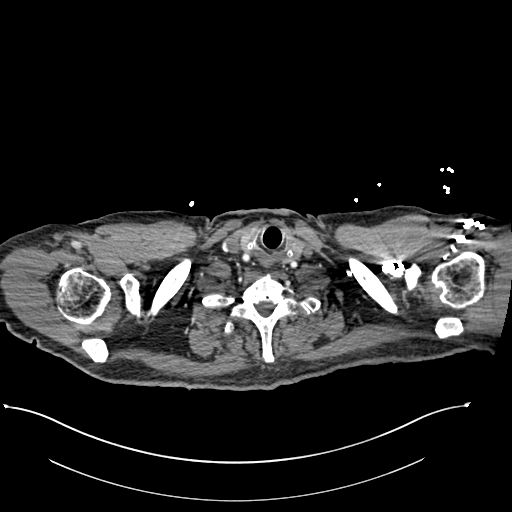
[im 282/301  lung]
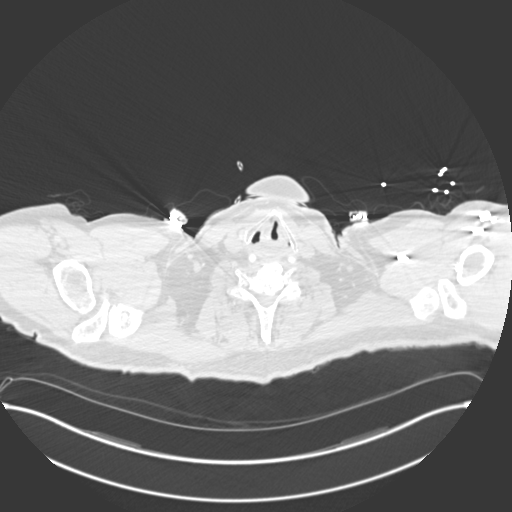

[Series 7: coronal mpr · coronal · 0.59mm/px · 2 of 90 slices shown]
[im 30/90  soft-tissue]
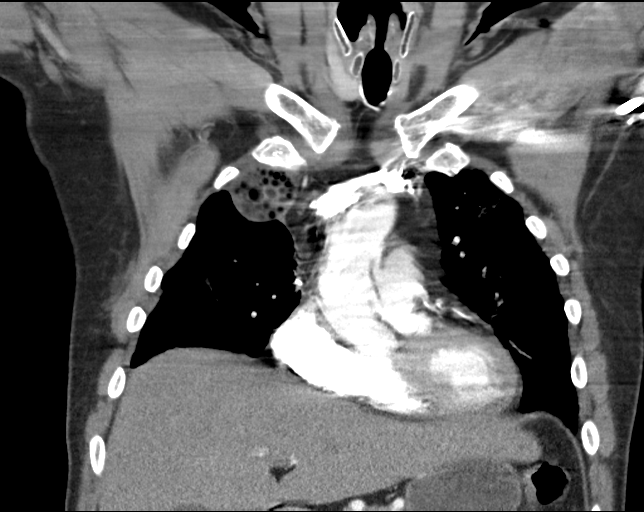
[im 60/90  soft-tissue]
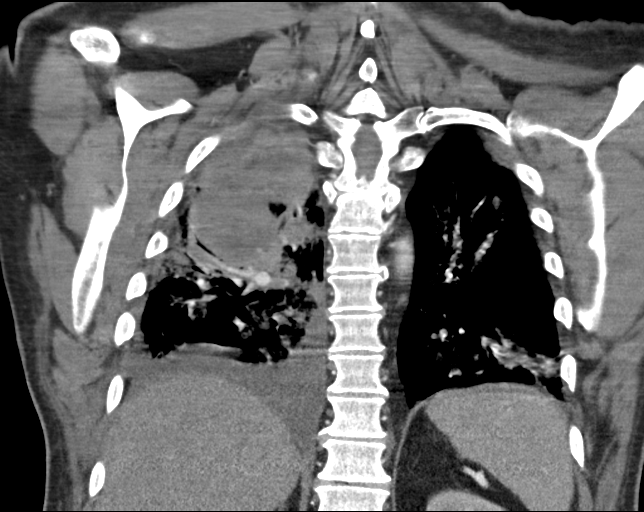

[17 of 46 positions shown; findings below may reference images not displayed]

FINDINGS: Cardiovascular: Suboptimal bolus density/timing exacerbated by
streak artifact and motion. No evidence of central, lobar, or (when
occasionally visible) segmental branch PE. No cardiomegaly or
pericardial effusion. No acute aortic finding. Diffuse atheromatous
calcification of the coronaries.

Mediastinum/Nodes: No noted adenopathy or inflammation.

Lungs/Pleura: Right apical collection with peripheral nodularity and
bulging of adjacent mediastinal structures, 10.6 Cm in maximal
transverse span. This collection contains new gas and there is
progressive adjacent pulmonary opacification. There is also new
infiltrate in the left lower lung with the same airspace pattern.
The major central airways are clear. The dependent right pleural
effusion is unchanged in size, small to moderate.

Upper Abdomen: Negative

Musculoskeletal: No acute finding no evidence of direct erosion or
hematogenous bony metastasis.

Review of the MIP images confirms the above findings.
IMPRESSION: 1. New gas in the complex right apical collection, now likely
communicating with the airways. Attendant increase in bilateral
airspace disease which could be alveolar hemorrhage, endobronchial
debris spread, or pneumonia.
2. Suboptimal pulmonary artery angiogram with no evidence of main or
lobar pulmonary embolism.
3. Notable nodularity at the periphery of the right apical
collection concerning for tumor recurrence.

## 2021-04-02 MED ORDER — PIPERACILLIN-TAZOBACTAM 3.375 G IVPB
3.3750 g | Freq: Three times a day (TID) | INTRAVENOUS | Status: DC
  Administered 2021-04-02 – 2021-04-06 (×12): 3.375 g via INTRAVENOUS
  Filled 2021-04-02 (×12): qty 50

## 2021-04-02 MED ORDER — SODIUM CHLORIDE 0.9 % IV BOLUS
250.0000 mL | Freq: Once | INTRAVENOUS | Status: AC
  Administered 2021-04-02: 250 mL via INTRAVENOUS

## 2021-04-02 MED ORDER — FUROSEMIDE 10 MG/ML IJ SOLN
INTRAMUSCULAR | Status: AC
  Administered 2021-04-02: 40 mg
  Filled 2021-04-02: qty 4

## 2021-04-02 MED ORDER — ACETAMINOPHEN 650 MG RE SUPP: 650.0000 mg | Freq: Three times a day (TID) | RECTAL | Status: DC | PRN

## 2021-04-02 MED ORDER — IOHEXOL 350 MG/ML SOLN
100.0000 mL | Freq: Once | INTRAVENOUS | Status: AC | PRN
  Administered 2021-04-02: 100 mL via INTRAVENOUS

## 2021-04-02 MED ORDER — VANCOMYCIN HCL 1500 MG/300ML IV SOLN
1500.0000 mg | Freq: Two times a day (BID) | INTRAVENOUS | Status: DC
  Administered 2021-04-02 – 2021-04-03 (×2): 1500 mg via INTRAVENOUS
  Filled 2021-04-02 (×2): qty 300

## 2021-04-02 MED ORDER — SODIUM CHLORIDE 0.9 % IV BOLUS
500.0000 mL | Freq: Once | INTRAVENOUS | Status: AC
  Administered 2021-04-02: 500 mL via INTRAVENOUS

## 2021-04-02 MED ORDER — VANCOMYCIN HCL 1500 MG/300ML IV SOLN
1500.0000 mg | Freq: Once | INTRAVENOUS | Status: AC
  Administered 2021-04-02: 1500 mg via INTRAVENOUS
  Filled 2021-04-02: qty 300

## 2021-04-02 MED ORDER — SODIUM ZIRCONIUM CYCLOSILICATE 10 G PO PACK: 10.0000 g | PACK | Freq: Once | ORAL | Status: DC

## 2021-04-02 MED ORDER — FUROSEMIDE 10 MG/ML IJ SOLN: 40.0000 mg | Freq: Once | INTRAMUSCULAR | Status: DC

## 2021-04-02 MED ORDER — LEVALBUTEROL HCL 0.63 MG/3ML IN NEBU
0.6300 mg | INHALATION_SOLUTION | Freq: Four times a day (QID) | RESPIRATORY_TRACT | Status: DC
  Administered 2021-04-02 – 2021-04-05 (×14): 0.63 mg via RESPIRATORY_TRACT
  Filled 2021-04-02 (×14): qty 3

## 2021-04-02 MED ORDER — LEVALBUTEROL HCL 0.63 MG/3ML IN NEBU
INHALATION_SOLUTION | RESPIRATORY_TRACT | Status: AC
  Administered 2021-04-02: 0.63 mg via RESPIRATORY_TRACT
  Filled 2021-04-02: qty 3

## 2021-04-02 MED ORDER — CHLORHEXIDINE GLUCONATE CLOTH 2 % EX PADS
6.0000 | MEDICATED_PAD | Freq: Every day | CUTANEOUS | Status: DC
  Administered 2021-04-02 – 2021-04-09 (×5): 6 via TOPICAL

## 2021-04-02 NOTE — Progress Notes (Signed)
Palliative Care Progress Note  Unfortunately Christopher Carter's condition deteriorated acutely early this AM with fever, hemoptysis and hypotension. He has now been moved to ICU -worsening findings on CTA of the Chest -loculated now gas filled area in right upper lobe cavity possibly communicating with the airway. He was sleeping deeply when I arrived at bedside, appeared very pale and wek, blood pressure is low. I spoke with his wife outside of the room-this is an unexpected turn of events and she is both sad and frustrated. She understandably feels a sense of urgency in getting the post operative issues resolved involving the affected lung-multiple courses of antibiotics have not resolved the issue.  Pain is currently under good control without PCA or frequent boluses.  Recommendations:  Full scope treatment-Goal is to manage cancer aggressively, achieve pain and symptom control and to return home when medically ready. Persistent recurrence of infection and hemoptysis of cavity and loculated area in right upper lobe cavity- antibiotics seem to be temporizing but once they are stopped his symptoms recur after a few days to a week. Need to figure out definitive management of this area. He is receiving radiation. Pain control is stable for now, will assess daily.  Christopher Hacker, DO Palliative Medicine  Time: 35 minutes Greater than 50%  of this time was spent counseling and coordinating care related to the above assessment and plan.

## 2021-04-02 NOTE — Progress Notes (Signed)
PROGRESS NOTE  Christopher Carter IYM:[id number] DOB: 06-13-1961   PCP: [name], MD  Patient is from: Home  DOA: 03/18/2021 LOS: 20  Chief complaints:  Chief Complaint  Patient presents with   Hemoptysis   Back Pain     Brief Narrative / Interim history: 60 year old M with PMH of stage IV adenocarcinoma s/p RUL lobectomy with node dissection on 5/16, recurrent hemoptysis, chronic RF on 5 L, tobacco abuse, HTN, depression, loculated hemithorax treated with IR chest tube placement, empiric antibiotics and prednisone taper and discharged to follow-up with CTS and radiation oncology.  He returned with cough with hemoptysis, right upper back pain, and increased oxygen requirement.  CT chest showed airspace disease in right lung representing alveolar hemorrhage with recurrence of apical fluid collection, nodular masslike lesion concerning for recurrence of malignancy and right peritracheal adenopathy.  He was admitted to [facility].  CTS, pulmonary IR and palliative consulted.  Started on IV cefepime, systemic steroid and tranexamic acid nebulization.  He underwent bronchoscopy that did not reveal active bleeding.  Hemoptysis seems to have resolved.  Started on PCA Dilaudid by PMT for pain control, and transferred to [facility] for radiation oncology care.  Patient spiked fever, tachycardia, tachypnea with increased oxygen requirement.  Blood cultures and CTA chest ordered.  Started on broad-spectrum antibiotics with Vanco and Zosyn.  Transferred to ICU  Subjective: Patient is spiking fever.  Tachycardic to 150s.  Increased oxygen requirement.   Objective: Vitals:   04/02/21 0533 04/02/21 0542 04/02/21 0600 04/02/21 0709  BP:  (!) 184/107 (!) 148/76 (!) 155/84  Pulse:  (!) 160 (!) 150 (!) 159  Resp:      Temp: 99.4 F (37.4 C) (!) 101.8 F (38.8 C)  (!) 104.6 F (40.3 C)  TempSrc:  Axillary  Rectal  SpO2:  96% 98%   Weight:      Height:        Intake/Output Summary (Last 24  hours) at 04/02/2021 0717 Last data filed at 04/02/2021 0615 Gross per 24 hour  Intake 363 ml  Output 900 ml  Net -537 ml   Filed Weights   03/18/21 0240 03/18/21 0242 03/25/21 1542  Weight: 78.9 kg 79.4 kg 81.7 kg    Examination:  GENERAL: No apparent distress.  Nontoxic. HEENT: On nonrebreather. NECK: Supple.  No apparent JVD.  RESP: 98% on NRB.  Rhonchorous. CVS: Tachycardic to 160s.Marland Kitchen Heart sounds normal.  ABD/GI/GU: BS+. Abd soft, NTND.  MSK/EXT:  Moves extremities. No apparent deformity. No edema.  SKIN: no apparent skin lesion or wound NEURO: Awake and alert. Oriented appropriately.  No apparent focal neuro deficit. PSYCH: Calm. Normal affect.   Procedures:  7/11-bronchoscopy  Microbiology summarized: [id number] and influenza PCR nonreactive. MRSA PCR screen negative. 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 s/p RUL lobectomy and node dissection by Dr. Koleen Nimrod on 5/16 Acute on chronic respiratory failure with hypoxia-on NRB.  CXR with new LLL PNA.  High risk for VTE. Severe sepsis: Febrile, tachycardic, tachypnea with acute on chronic hypoxic respiratory failure -7/10-CTA chest concerning for alveolar hemorrhage, loculated fluid at right lung apex lined by masslike nodularity consistent with recurrence of malignancy and right peritracheal LAD -7/11-bronchoscopy on 7/11-no active hemorrhage -7/12-respiratory culture with normal respiratory flora -7/13-Rad/onc-started radiation.  Planned for 6 and half week -7/18-completed 7 days of IV cefepime.  -  7/19-repeat CTA chest without significant change, may be improved hemorrhage -7/21-started on azithromycin.   -7/23-started on Augmentin for dental abscess -7/24-prednisone resumed at 20 mg daily due to recurrence of  hemoptysis -7/25-fever, increased O2 requirement, tachycardia and tachypnea.  CXR with new LLL infiltrate. -Transfer to ICU.  NPO.  PCCM and patient's wife notified. -Blood cultures, lactic acid, CBC, Vanco, Zosyn and CTA chest    Uncontrolled cancer-related pain: Rates his pain 6/10 today -Palliative medicine managing  -Fentanyl patch increased to 100 mcg on 7/23. -Started MS IR 7.5 mg every 4 hours as needed -Fentanyl PCA -Celebrex and gabapentin. -On scheduled and as needed Valium for anxiety -Narcan as needed     Dental pain/possible infection: Was on penicillin V outpatient.  Plan for root canal Tx on 7/11 but postponed.  -In-house dental surgery consulted and recommended antibiotics and outpatient follow-up with his orthodontist -Started on Augmentin.  Now on Zosyn due to sepsis.   Anemia of chronic disease due to cancer: H&H relatively stable. Recent Labs    03/20/21 0407 03/21/21 0057 03/22/21 0212 03/23/21 0532 03/24/21 0224 03/25/21 0357 03/27/21 0749 03/28/21 0335 03/30/21 0339 04/01/21 0353  HGB 8.8* 8.5* 8.5* 8.6* 8.1* 8.3* 9.2* 9.0* 9.2* 8.6*  -CBC this morning    Essential hypertension: BP slightly elevated -Continue home amlodipine, irbesartan when safe to take p.o.  Controlled DM-2 with hyperglycemia: A1c 6.5%.  Improved after he came off steroid but back on steroid again. Recent Labs  Lab 04/01/21 1100 04/01/21 1620 04/01/21 2045 04/02/21 0055 04/02/21 0528  GLUCAP 155* 153* 159* 130* 114*  -Continue SSI moderate -NovoLog 4 units 3 times daily with meals -Lantus 10 units twice daily -May adjust based on his evening CBG -Further adjustment as appropriate    History of anxiety/depression:  -On Seroquel, Zoloft and Valium   GERD: -On  Protonix     Body mass index is 25.84 kg/m. Nutrition Problem: Increased nutrient needs Etiology: cancer and cancer related treatments Signs/Symptoms: estimated needs Interventions: Ensure Enlive (each  supplement provides 350kcal and 20 grams of protein), MVI   DVT prophylaxis:  Place and maintain sequential compression device Start: 03/30/21 0804 Place and maintain sequential compression device Start: 03/29/21 0835 SCDs Start: 03/18/21 1527  Code Status: Full code Family Communication: Patient and/or RN.  Updated patient's wife over the phone. Level of care: Progressive Status is: Inpatient  Remains inpatient appropriate because:Hemodynamically unstable, Ongoing active pain requiring inpatient pain management, Ongoing diagnostic testing needed not appropriate for outpatient work up, IV treatments appropriate due to intensity of illness or inability to take PO, and Inpatient level of care appropriate due to severity of illness  Dispo: The patient is from: Home              Anticipated d/c is to: Home              Patient currently is not medically stable to d/c.   Difficult to place patient No       Consultants:  PCCM Cardiothoracic surgery-signed off IR-signed off Palliative medicine Oncology Radiation oncology Dental surgery   Sch Meds:  Scheduled Meds:  (feeding supplement) PROSource Plus  30 mL Oral BID BM   albuterol  2.5 mg Nebulization Q6H   amLODipine  10 mg Oral Daily   azithromycin  250 mg Oral Daily   benzonatate  200 mg Oral BID   celecoxib  200 mg Oral BID   diazepam  1 mg Oral Q8H   feeding supplement  237  mL Oral BID BM   fentaNYL  1 patch Transdermal Q72H   ferrous sulfate  325 mg Oral Q breakfast   furosemide  40 mg Intravenous Once   gabapentin  300 mg Oral BID WC   gabapentin  600 mg Oral QHS   guaiFENesin  600 mg Oral BID   insulin aspart  0-15 Units Subcutaneous TID WC   insulin aspart  0-5 Units Subcutaneous QHS   insulin aspart  4 Units Subcutaneous TID WC   insulin glargine  10 Units Subcutaneous BID   irbesartan  300 mg Oral Daily   latanoprost  1 drop Both Eyes QHS   lidocaine  1 patch Transdermal Q24H   multivitamin with minerals   1 tablet Oral Daily   pantoprazole  40 mg Oral Daily   predniSONE  20 mg Oral Q breakfast   QUEtiapine  100 mg Oral QHS   sertraline  100 mg Oral Daily   sodium chloride flush  3 mL Intravenous Q12H   Continuous Infusions:  tranexamic acid     PRN Meds:.acetaminophen, acetaminophen, albuterol, diazepam, diphenhydrAMINE **OR** diphenhydrAMINE, fentaNYL (SUBLIMAZE) injection, fentaNYL (SUBLIMAZE) injection, morphine, naloxone **AND** sodium chloride flush, ondansetron **OR** ondansetron (ZOFRAN) IV, ondansetron (ZOFRAN) IV, polyethylene glycol  Antimicrobials: Anti-infectives (From admission, onward)    Start     Dose/Rate Route Frequency Ordered Stop   03/30/21 1130  amoxicillin-clavulanate (AUGMENTIN) 875-125 MG per tablet 1 tablet  Status:  Discontinued        1 tablet Oral Every 12 hours 03/30/21 1035 04/02/21 0716   03/29/21 1200  azithromycin (ZITHROMAX) tablet 250 mg       Note to Pharmacy: For dental infection   250 mg Oral Daily 03/29/21 1100     03/19/21 0900  vancomycin (VANCOREADY) IVPB 1250 mg/250 mL  Status:  Discontinued        1,250 mg 166.7 mL/hr over 90 Minutes Intravenous Every 12 hours 03/18/21 1842 03/20/21 1059   03/18/21 2200  ceFEPIme (MAXIPIME) 2 g in sodium chloride 0.9 % 100 mL IVPB        2 g 200 mL/hr over 30 Minutes Intravenous Every 8 hours 03/18/21 1836 03/26/21 0835   03/18/21 1930  vancomycin (VANCOREADY) IVPB 1500 mg/300 mL        1,500 mg 150 mL/hr over 120 Minutes Intravenous  Once 03/18/21 1836 03/19/21 0734   03/18/21 1915  azithromycin (ZITHROMAX) 500 mg in sodium chloride 0.9 % 250 mL IVPB  Status:  Discontinued        500 mg 250 mL/hr over 60 Minutes Intravenous Every 24 hours 03/18/21 1822 03/19/21 0840   03/18/21 1800  penicillin v potassium (VEETID) tablet 500 mg  Status:  Discontinued       Note to Pharmacy: Start date : 03/14/21     500 mg Oral 4 times daily 03/18/21 1527 03/18/21 1842        I have personally reviewed the  following labs and images: CBC: Recent Labs  Lab 03/27/21 0749 03/28/21 0335 03/30/21 0339 04/01/21 0353  WBC 20.4* 18.7* 21.8* 11.3*  HGB 9.2* 9.0* 9.2* 8.6*  HCT 29.2* 28.6* 28.9* 28.1*  MCV 95.7 95.3 97.0 97.9  PLT 467* 467* 427* 339   BMP &GFR Recent Labs  Lab 03/27/21 0749 03/28/21 0335 04/01/21 0353 04/02/21 0555  NA 139 138 138 134*  K 4.1 4.4 3.7 5.5*  CL 100 99 99 96*  CO2 29 29 30 28   GLUCOSE 238* 288* 220* 133*  BUN  13 13 19 16   CREATININE 0.42* 0.40* 0.43* 0.69  CALCIUM 9.0 8.9 8.4* 8.6*  MG 2.0 2.0 1.9  --   PHOS 2.9 2.7 4.0  --    Estimated Creatinine Clearance: 103.9 mL/min (by C-G formula based on SCr of 0.69 mg/dL). Liver & Pancreas: Recent Labs  Lab 03/27/21 0749 03/28/21 0335 04/01/21 0353 04/02/21 0555  AST  --   --   --  46*  ALT  --   --   --  31  ALKPHOS  --   --   --  86  BILITOT  --   --   --  1.3*  PROT  --   --   --  6.7  ALBUMIN 2.6* 2.6* 2.5* 2.7*   No results for input(s): LIPASE, AMYLASE in the last 168 hours. No results for input(s): AMMONIA in the last 168 hours. Diabetic: No results for input(s): HGBA1C in the last 72 hours. Recent Labs  Lab 04/01/21 1100 04/01/21 1620 04/01/21 2045 04/02/21 0055 04/02/21 0528  GLUCAP 155* 153* 159* 130* 114*   Cardiac Enzymes: No results for input(s): CKTOTAL, CKMB, CKMBINDEX, TROPONINI in the last 168 hours. No results for input(s): PROBNP in the last 8760 hours. Coagulation Profile: No results for input(s): INR, PROTIME in the last 168 hours. Thyroid Function Tests: No results for input(s): TSH, T4TOTAL, FREET4, T3FREE, THYROIDAB in the last 72 hours. Lipid Profile: No results for input(s): CHOL, HDL, LDLCALC, TRIG, CHOLHDL, LDLDIRECT in the last 72 hours. Anemia Panel: No results for input(s): VITAMINB12, FOLATE, FERRITIN, TIBC, IRON, RETICCTPCT in the last 72 hours. Urine analysis:    Component Value Date/Time   COLORURINE YELLOW 01/18/2021 1500   APPEARANCEUR CLEAR  01/18/2021 1500   LABSPEC 1.006 01/18/2021 1500   PHURINE 6.0 01/18/2021 1500   GLUCOSEU NEGATIVE 01/18/2021 1500   HGBUR SMALL (A) 01/18/2021 1500   BILIRUBINUR NEGATIVE 01/18/2021 1500   KETONESUR NEGATIVE 01/18/2021 1500   PROTEINUR NEGATIVE 01/18/2021 1500   NITRITE NEGATIVE 01/18/2021 1500   LEUKOCYTESUR NEGATIVE 01/18/2021 1500   Sepsis Labs: Invalid input(s): PROCALCITONIN, [place]  Microbiology: No results found for this or any previous visit (from the past 240 hour(s)).   Radiology Studies: DG Chest Port 1 View  Result Date: 04/02/2021 CLINICAL DATA:  Shortness of breath and tachycardia. EXAM: PORTABLE CHEST 1 VIEW COMPARISON:  03/27/2021 FINDINGS: Stable cardiomediastinal silhouette. Right upper lobe opacity is again noted corresponding to loculated pleural fluid and recurrent tumor as noted on recent chest CT from 03/27/2021. No pleural effusion or edema. New airspace opacity identified within the left lower lobe. The visualized osseous structures are unremarkable. IMPRESSION: 1. New left lower lobe airspace opacity concerning for pneumonia. 2. Stable right upper lobe opacity corresponding to loculated pleural fluid and recurrent tumor. Electronically Signed   By: Kerby Moors M.D.   On: 04/02/2021 06:44      Kate Sweetman T. Blanchard  If 7PM-7AM, please contact night-coverage [email or web address] 04/02/2021, 7:17 AM

## 2021-04-02 NOTE — Progress Notes (Signed)
Nutrition Follow-up  DOCUMENTATION CODES:   Not applicable  INTERVENTION:  - continue Ensure Enlive BID and 30 ml Prosource Plus BID.  - weigh patient today   NUTRITION DIAGNOSIS:   Increased nutrient needs related to cancer and cancer related treatments as evidenced by estimated needs. -ongoing  GOAL:   Patient will meet greater than or equal to 90% of their needs -met on average  MONITOR:   PO intake, Supplement acceptance, Labs, Weight trends  ASSESSMENT:   Pt with a PMH significant for stage IIIa adenocarcinoma now s/p R upper lobectomy w/ node dissection 01/22/21 on 5L [place] at baseline, HTN, and depression who presented with recurrent hemoptysis. 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  Patient was discussed in rounds this AM. He was transferred to 2W from 4W at shift change this AM d/t a Rapid Response event (worsening hypoxia and fever of 104).   Recently documented intakes over the past week are mainly 100%. He has been accepting Ensure and Prosource Plus 100% of the time offered.   If he consumes 100% of each supplement when he accepts it, he is consuming 900 kcal and 70 grams protein/day from oral nutrition supplements.   He has not been weighed since 7/17. No information documented in the edema section of flow sheet throughout hospitalization.  Per notes: - worsening hypoxia - CXR showing new LLL infiltrate thought to be PNA - hemoptysis--improving - stage 4 adenocarcinoma of the lung s/p R upper lobectomy - dental pain with possible infection--was to have a root canal on 7/11 but it has been postponed d/t hospitalization; plan for follow-up outpatient   Labs reviewed; CBGs: 167 and 136 mg/dl, Na: 134 mmol/l, Cl: 96 mmol/l. Medications reviewed; 325 mg ferrous sulfate/day, 40 mg IV lasix x1 dose 7/25, sliding scale novolog, 4 units novolog TID, 10 units lantus/day, 1 tablet multivitamin with minerals/day, 40 mg  oral protonix/day, 20 mg deltasone/day.   Diet Order:   Diet Order             Diet Carb Modified Fluid consistency: Thin; Room service appropriate? Yes  Diet effective now                   EDUCATION NEEDS:   No education needs have been identified at this time  Skin:  Skin Assessment: Reviewed RN Assessment  Last BM:  7/18 (type 4)  Height:   Ht Readings from Last 1 Encounters:  03/25/21 _0  (1.778 m)    Weight:   Wt Readings from Last 1 Encounters:  03/25/21 81.7 kg      Estimated Nutritional Needs:  Kcal:  2400-2600 Protein:  120-130g Fluid:  >2L      Jarome Matin, MS, RD, LDN, CNSC Inpatient Clinical Dietitian RD pager # available in AMION  After hours/weekend pager # available in [facility]

## 2021-04-02 NOTE — Progress Notes (Signed)
@  0400 Pt c/o SOB O2 Sat 80% on 2L [name] and this RN  increased to 4 L [name] and notified RT for PRN nebulizer treatment. Pt comfortably up in the chair and able to ambulated to the bathroom, O2 sat 91% on 4 L [name].  @0510  RN checked pt, Pt alert, c/o SOB O2 sat drops to 80% on 4L [name], HR sustaining to 150-160, pt having chills, notified RT placed on NRB mask, notified Rapid Response Nurse and MD on call, see new orders.  Will continue plan of care.

## 2021-04-02 NOTE — Progress Notes (Signed)
Prosource nutrition packet not administered pt. Pt has eaten 100% of his lunch and dinner. BG is 313. Sliding scale insulin administered per BG range.

## 2021-04-02 NOTE — Plan of Care (Signed)

## 2021-04-02 NOTE — Progress Notes (Signed)
14 Days Post-Op Procedure(s) (LRB): VIDEO BRONCHOSCOPY WITHOUT FLUORO (N/A) Subjective: Christopher Carter had been making progress and was doing well 2 days ago. Yesterday had a couple of small episodes of hemoptysis, then later became short of breath and spiked fever to 104. He was started on Vanco and Zosyn. A CT of the chest was performed.  This evening temp is down. He is very tired. C/o pain right hip.  Objective: Vital signs in last 24 hours: Temp:  [97.6 F (36.4 C)-104.6 F (40.3 C)] 97.9 F (36.6 C) (07/25 1300) Pulse Rate:  [85-160] 85 (07/25 1600) Cardiac Rhythm: Sinus tachycardia (07/25 1200) Resp:  [8-24] 19 (07/25 1600) BP: (94-184)/(50-107) 102/58 (07/25 1600) SpO2:  [80 %-100 %] 100 % (07/25 1600) Weight:  [82.4 kg] 82.4 kg (07/25 1330)  Hemodynamic parameters for last 24 hours:    Intake/Output from previous day: 07/24 0701 - 07/25 0700 In: 363 [P.O.:360; I.V.:3] Out: 900 [Urine:900] Intake/Output this shift: Total I/O In: 518.4 [IV Piggyback:518.4] Out: 1200 [Urine:1200]  General appearance: cooperative and no distress Neurologic: intact Heart: regular rate and rhythm Lungs: coarse BS on Right, no wheezing  Lab Results: Recent Labs    04/01/21 0353 04/02/21 0802  WBC 11.3* 14.0*  HGB 8.6* 9.9*  HCT 28.1* 31.4*  PLT 339 296   BMET:  Recent Labs    04/01/21 0353 04/02/21 0555 04/02/21 0850  NA 138 134*  --   K 3.7 5.5* 4.0  CL 99 96*  --   CO2 30 28  --   GLUCOSE 220* 133*  --   BUN 19 16  --   CREATININE 0.43* 0.69  --   CALCIUM 8.4* 8.6*  --     PT/INR: No results for input(s): LABPROT, INR in the last 72 hours. ABG    Component Value Date/Time   PHART 7.458 (H) 02/26/2021 0630   HCO3 28.7 (H) 02/26/2021 0630   TCO2 30 02/26/2021 0630   ACIDBASEDEF 1.3 01/18/2021 1504   O2SAT 96.0 02/26/2021 0630   CBG (last 3)  Recent Labs    04/02/21 0758 04/02/21 1118 04/02/21 1630  GLUCAP 167* 136* 160*    Assessment/Plan: S/P  Procedure(s) (LRB): VIDEO BRONCHOSCOPY WITHOUT FLUORO (N/A) - Recurrent fever in setting of recent right upper lobectomy with residual tumor in the chest wall with a recurrent loculated right apical effusion. I reviewed the CT images. There are some small air bubbles in the right effusion raising concern for possible infection.  There another component inferiorly that may represent extension, but looks like it might possibly be a lung abscess in the superior segment of the lower lobe.  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  I agree with broad spectrum antibiotics for fevers.  The right apical fluid collection needs to be drained. Options are CT guided vs surgical drainage. Ct guided percutaneous drainage is the best option. IF there is a BPF it will need to be dealt with anyway.  Discussed with IR- they will place drain in AM. Fluid needs to be sent for culture.  Will follow   LOS: 14 days    Christopher Carter 04/02/2021

## 2021-04-02 NOTE — Progress Notes (Addendum)
NAME:  Christopher Carter, MRN:  [id number], DOB:  20-Oct-1960, LOS: 75 ADMISSION DATE:  03/18/2021, CONSULTATION DATE:  03/18/2021 REFERRING MD:  Dr. Tamala Julian, CHIEF COMPLAINT:  Hemoptysis    History of Present Illness:  Christopher Carter is a 60 y.o. male with a PMH significant for Stage IV adenocarcinoma now S/P right upper lobectomy with node dissection 01/22/2021, on 5L [place] at baseline, tobacco abuse, HTN, and depression who presented to [facility] for recurrent hemoptysis. Patient has been treated at this facility for similar presentation and underwent video bronchoscopy and IR placed chest tube for pleural effusion with Dr. Roxan Hockey. He was discharged in stable condition with plans to follow up with cardiothoracic surgery and oncology.  He presented this admission with recurrent hemoptysis that started 4hr prior to admission with associated right upper back pain that radiates to upper neck. Patient was transferred from [place] to [facility] for cardiothoracic, pulmonary, and IR consults. Vital signs stable, WBC elevated at 18.1. CT chest was obtained and consistent with alveolar hemorrhage localized to the right with right apical fluid collection.   Pertinent  Medical History  Stage IIIa adenocarcinoma now S/P right upper lobectomy with node dissection 01/22/2021, on 5L [place] at baseline, tobacco abuse, HTN, and depression   Significant Hospital Events:  7/10 admitted with recurrent hemoptysis  7/11 bronch- nonfocal source, had bloody frothy secretions in all airways, petechiae on bronchi Receiving radiation therapy Pain is better controlled 7/25 worsening hypoxia and new temp to 104  Interim History / Subjective:  Hypoxic to 80's.  Placed on NRB with improvement to high 90s.  Has new temp of 104.12F. States he is still dyspneic, feels "smothered".  States this came on suddenly early this AM.  Continues to have cough, occasionally productive of blood tinged sputum. CXR with new  LLL opacity.  Objective   Blood pressure (!) 155/84, pulse (!) 159, temperature (!) 104.6 F (40.3 C), temperature source Rectal, resp. rate (!) 24, height 5\' 10"  (1.778 m), weight 81.7 kg, SpO2 98 %.        Intake/Output Summary (Last 24 hours) at 04/02/2021 0741 Last data filed at 04/02/2021 0733 Gross per 24 hour  Intake 363 ml  Output 1200 ml  Net -837 ml    Filed Weights   03/18/21 0240 03/18/21 0242 03/25/21 1542  Weight: 78.9 kg 79.4 kg 81.7 kg    Examination: General: Adult male, resting in bed, dyspneic. Neuro: A&O x 3, no deficits. HEENT: [place]/AT. Sclerae anicteric. EOMI.  NRB in place. Cardiovascular: Tachy, regular, no M/R/G.  Lungs: Respirations slightly labored.  Coarse L base, diminished RUL. Abdomen: BS x 4, soft, NT/ND.  Musculoskeletal: No gross deformities, no edema.  Skin: Intact, warm, no rashes.  Resolved Hospital Problem list     Assessment & Plan:   Acute on chronic hypoxemic respiratory failure - worsened overnight and early AM 7/25 requiring NRB.  New temp of 104 with CXR suggestive of evolving LLL infiltrate. Presumed PNA. R/o PE - Continue supplemental O2 as needed to maintain SpO2 > 90%. - Agree with broadened abx (vanc/zosyn). - Follow cultures. - CXR suggestive of early LLL PNA which could explain his acute change this AM.  However, agree that he is at increased risk for VTE; therefore, reasonable to proceed with CTA to r/o PE.  Hemoptysis - improving. - Continue prednisone at 20 daily. - Monitor.  Mild hyperkalemia ? - sample hemolyzed. - Repeat BMP pending.  Stage IV adenocarcinoma of the lung s/p  right upper lobectomy. - Supportive care for now. - F/u as outpatient.  Pain control - 2/2 above. - Continue pain control per palliative medicine.  DM - anticipate slight worsening of CBG's with steroids. - Continue SSI, lantus.  Dental pain / possible infection - had been treated with PenV as outpatient with planned root canal 7/11;  however, this was postponed. - Dental surgery recommending abx and outpatient follow up.   Best Practice   Diet/type: NPO w/ oral meds DVT prophylaxis: SCD GI prophylaxis: N/A Lines: N/A Foley:  N/A Code Status:  full code Last date of multidisciplinary goals of care discussion: Per primary   Montey Hora, [place] For pager details, please see AMION or use Epic chat  After 1900, please call [place] for cross coverage needs 04/02/2021, 7:59 AM

## 2021-04-02 NOTE — Progress Notes (Signed)
Discussed CT findings with spouse  Await CT surgery input  Continue antibiotics  Ongoing discussion with Dr Cyndia Skeeters as well

## 2021-04-02 NOTE — Progress Notes (Signed)
SBP low to mid 90s. Per Ander Slade MD 250 cc NS bolus ordered.   Gonfa MD notified of profuse sweating and soaked bed x2. Oral temperature 97.6, RN checked rectal temperature 97.8. BP also still mid to low 90s-500 cc NS bolus ordered per Cyndia Skeeters MD.   Patient wife concerned about traveling to radiation at this time after earlier rapid response, increased sweating and low BP. RN discussed with patient- agrees to hold radiation today and continue tomorrow. Radiation RN aware and also agreeable with plan. Patient resting comfortably at this time with no complaints of pain.

## 2021-04-02 NOTE — Progress Notes (Signed)
Pharmacy Antibiotic Note  Christopher Carter is a 60 y.o. male admitted on 03/18/2021 with Lung Ca, RUL lobectomy, abx included Vanc/Cefepime, Augmentin, Azithromycin - current Augmentin/po Azithromycin.  Pharmacy has been consulted for Vancomycin/Zosyn dosing with Temp 104.6, WBC decreasing.  Plan: Zosyn 3.375g IV q8h (4 hour infusion). Vancomycin 1500mg  q12, AUC 488, SCr 0.69  Height: 5\' 10"  (177.8 cm) Weight: 81.7 kg (180 lb 1.9 oz) IBW/kg (Calculated) : 73  Temp (24hrs), Avg:100 F (37.8 C), Min:97.6 F (36.4 C), Max:104.6 F (40.3 C)  Recent Labs  Lab 03/27/21 0749 03/28/21 0335 03/30/21 0339 04/01/21 0353 04/02/21 0555  WBC 20.4* 18.7* 21.8* 11.3*  --   CREATININE 0.42* 0.40*  --  0.43* 0.69    Estimated Creatinine Clearance: 103.9 mL/min (by C-G formula based on SCr of 0.69 mg/dL).    Allergies  Allergen Reactions   Oxycodone Hcl Other (See Comments)    Pt reported "seeing things" and " feeling unusual."   Bupropion Nausea And Vomiting    Antimicrobials this admission: Augmentin 7/22 >> 7/25 Cefepime 7/10 >> 7/18 Vanc 7/10 >>7/12, resumed 7/25 Zosyn 7/25 >> Azithro 7/10 >>7/11, resumed po 7/21 PTA PCN QID 7/6>>7/10  Dose adjustments this admission:  Microbiology results: 7/12 Sputum Cx: few GPC, rare GNR 7/12 MRSA PCR: neg  Thank you for allowing pharmacy to be a part of this patient's care.  Minda Ditto PharmD 04/02/2021 7:26 AM

## 2021-04-03 ENCOUNTER — Inpatient Hospital Stay (HOSPITAL_COMMUNITY): Payer: 59

## 2021-04-03 ENCOUNTER — Ambulatory Visit
Admit: 2021-04-03 | Discharge: 2021-04-03 | Disposition: A | Discharge status: Home / Self Care | Payer: 59 | Attending: Radiation Oncology | Admitting: Radiation Oncology

## 2021-04-03 ENCOUNTER — Other Ambulatory Visit: Payer: Self-pay | Admitting: Thoracic Surgery (Cardiothoracic Vascular Surgery)

## 2021-04-03 ENCOUNTER — Encounter (HOSPITAL_COMMUNITY): Payer: Self-pay | Admitting: Internal Medicine

## 2021-04-03 ENCOUNTER — Encounter: Payer: Self-pay | Admitting: Thoracic Surgery (Cardiothoracic Vascular Surgery)

## 2021-04-03 DIAGNOSIS — I10 Essential (primary) hypertension: Secondary | ICD-10-CM | POA: Diagnosis not present

## 2021-04-03 DIAGNOSIS — J851 Abscess of lung with pneumonia: Secondary | ICD-10-CM | POA: Diagnosis not present

## 2021-04-03 DIAGNOSIS — D72829 Elevated white blood cell count, unspecified: Secondary | ICD-10-CM | POA: Diagnosis not present

## 2021-04-03 DIAGNOSIS — J9621 Acute and chronic respiratory failure with hypoxia: Secondary | ICD-10-CM | POA: Diagnosis not present

## 2021-04-03 DIAGNOSIS — R0489 Hemorrhage from other sites in respiratory passages: Secondary | ICD-10-CM | POA: Diagnosis not present

## 2021-04-03 LAB — BASIC METABOLIC PANEL
Anion gap: 7 (ref 5–15)
BUN: 17 mg/dL (ref 6–20)
CO2: 31 mmol/L (ref 22–32)
Calcium: 8.4 mg/dL — ABNORMAL LOW (ref 8.9–10.3)
Chloride: 99 mmol/L (ref 98–111)
Creatinine, Ser: 0.51 mg/dL — ABNORMAL LOW (ref 0.61–1.24)
GFR, Estimated: >60 mL/min (ref >60)
Glucose, Bld: 154 mg/dL — ABNORMAL HIGH (ref 70–99)
Potassium: 3.7 mmol/L (ref 3.5–5.1)
Sodium: 137 mmol/L (ref 135–145)

## 2021-04-03 LAB — CBC
HCT: 27.4 % — ABNORMAL LOW (ref 39.0–52.0)
Hemoglobin: 8.4 g/dL — ABNORMAL LOW (ref 13.0–17.0)
MCH: 29.8 pg (ref 26.0–34.0)
MCHC: 30.7 g/dL (ref 30.0–36.0)
MCV: 97.2 fL (ref 80.0–100.0)
Platelets: 232 10*3/uL (ref 150–400)
RBC: 2.82 MIL/uL — ABNORMAL LOW (ref 4.22–5.81)
RDW: 15.5 % (ref 11.5–15.5)
WBC: 11.6 10*3/uL — ABNORMAL HIGH (ref 4.0–10.5)
nRBC: 0 % (ref 0.0–0.2)

## 2021-04-03 LAB — EXPECTORATED SPUTUM ASSESSMENT W GRAM STAIN, RFLX TO RESP C

## 2021-04-03 LAB — PROTIME-INR
INR: 1.2 (ref 0.8–1.2)
Prothrombin Time: 14.8 seconds (ref 11.4–15.2)

## 2021-04-03 LAB — GLUCOSE, CAPILLARY
Glucose-Capillary: 188 mg/dL — ABNORMAL HIGH (ref 70–99)
Glucose-Capillary: 299 mg/dL — ABNORMAL HIGH (ref 70–99)
Glucose-Capillary: 176 mg/dL — ABNORMAL HIGH (ref 70–99)

## 2021-04-03 LAB — MAGNESIUM: Magnesium: 2.3 mg/dL (ref 1.7–2.4)

## 2021-04-03 LAB — PHOSPHORUS: Phosphorus: 3.2 mg/dL (ref 2.5–4.6)

## 2021-04-03 IMAGING — CT CT IMAGE GUIDED DRAINAGE BY PERCUTANEOUS CATHETER
1 of 3 series · 11 of 30 positions shown, 14 images · non-contrast
Comparison: none

CLINICAL DATA: Loculated effusion post partial pneumonectomy

[Series 2: i-spiral 5.0 br40 · axial · 0.98mm/px · z∈[-165,-53]mm · 11 of 40 slices shown, 14 images]
[im 4/40  mediastinal]
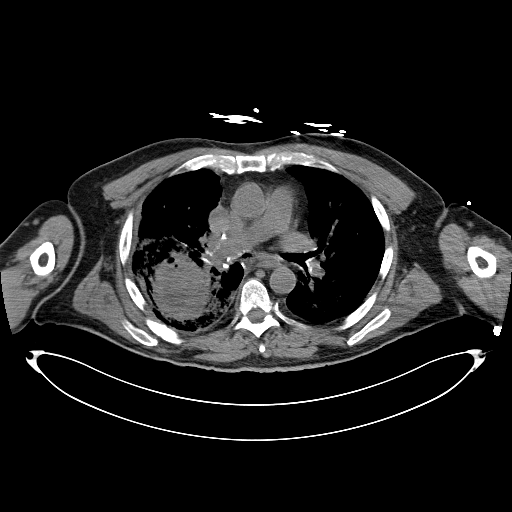
[im 4/40  lung]
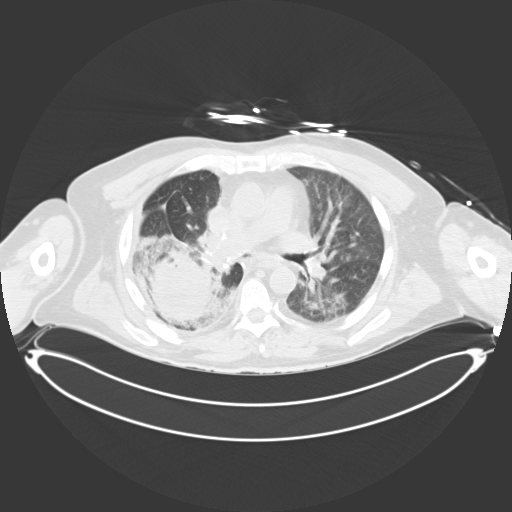
[im 8/40  lung]
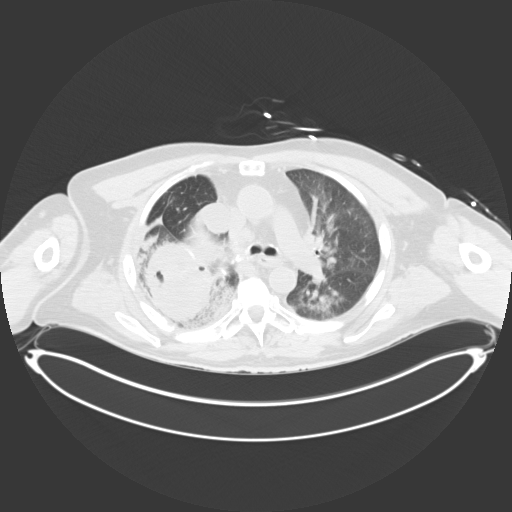
[im 11/40  lung]
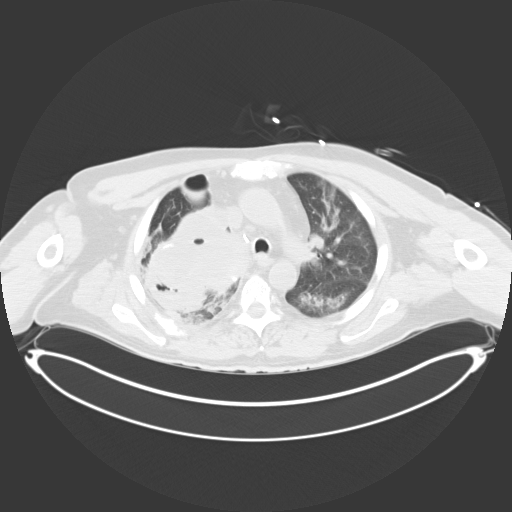
[im 15/40  lung]
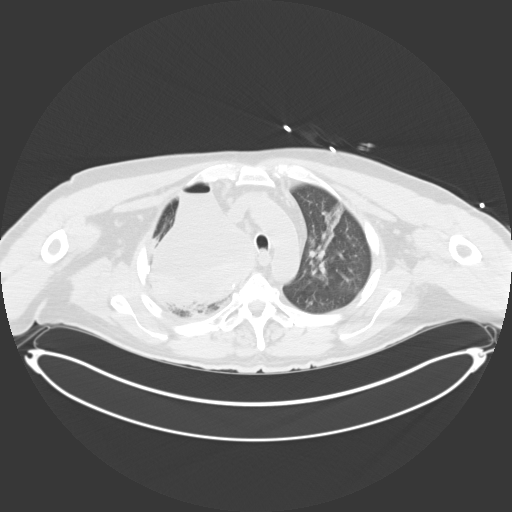
[im 18/40  mediastinal]
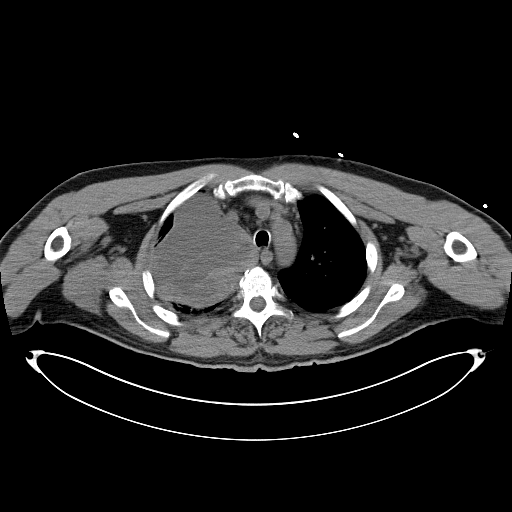
[im 18/40  lung]
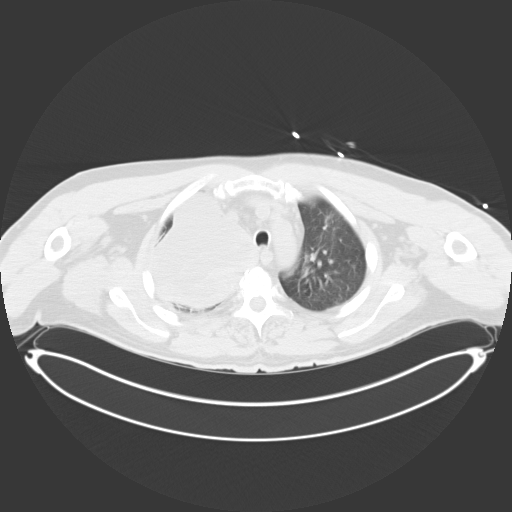
[im 20/40  lung]
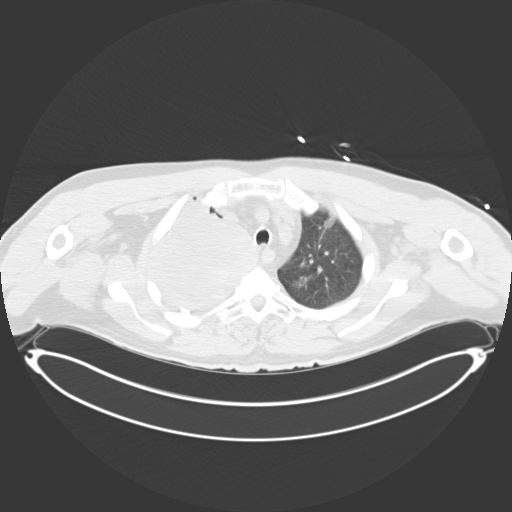
[im 22/40  lung]
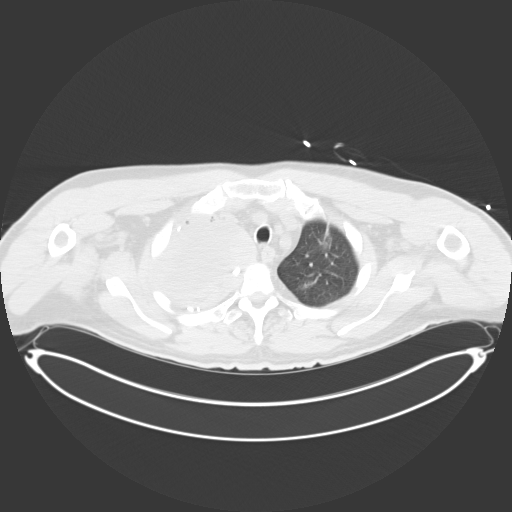
[im 25/40  lung]
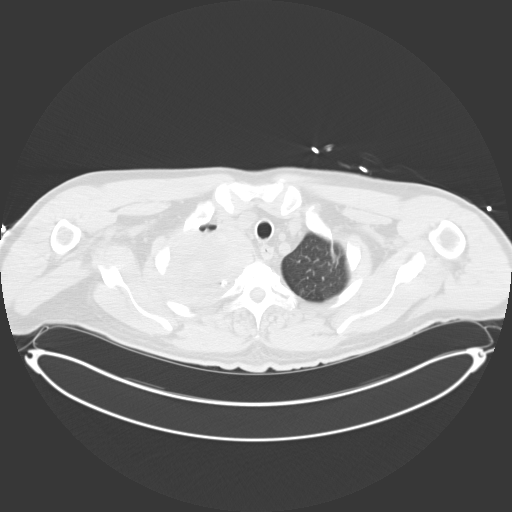
[im 29/40  mediastinal]
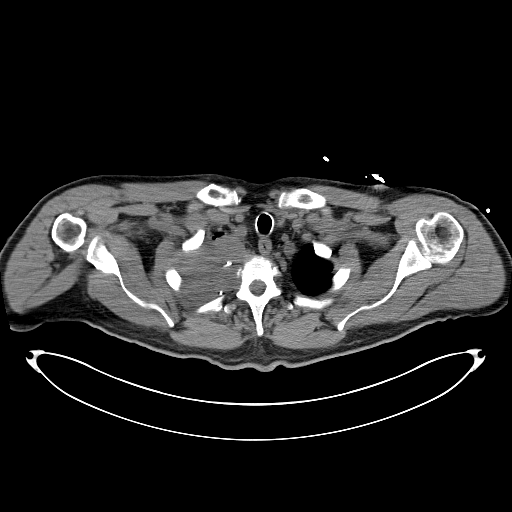
[im 29/40  lung]
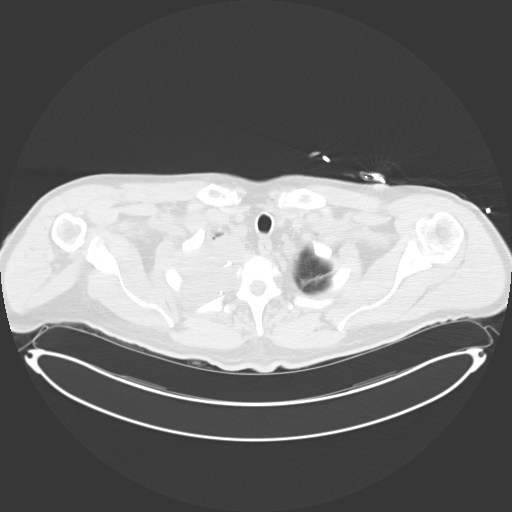
[im 32/40  lung]
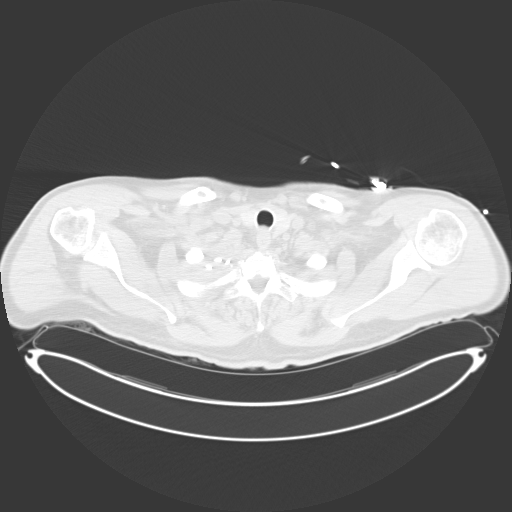
[im 36/40  lung]
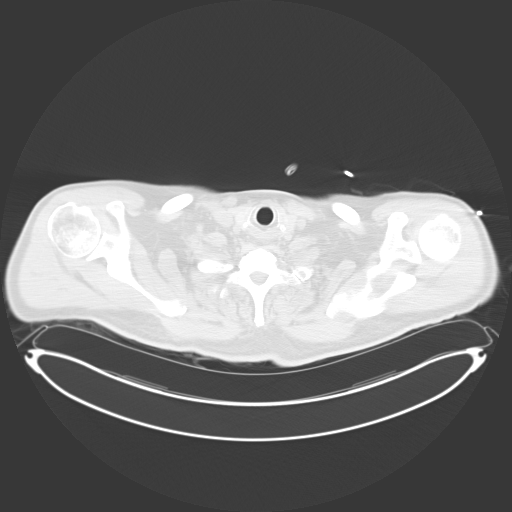

[11 of 30 positions shown; findings below may reference images not displayed]

EXAM:
CT GUIDED CHEST DRAIN PLACEMENT

ANESTHESIA/SEDATION:
Intravenous Fentanyl [N3] and Versed 0.5mg were administered as
conscious sedation during continuous monitoring of the patient's
level of consciousness and physiological / cardiorespiratory status
by the radiology RN, with a total moderate sedation time of 22
minutes.

PROCEDURE:
The procedure, risks, benefits, and alternatives were explained to
the patient. Questions regarding the procedure were encouraged and
answered. The patient understands and consents to the procedure.

Select axial scans through the thorax were obtained. The loculated
right apical collection was localized and an appropriate skin entry
site was determined and marked.

The operative field was prepped with chlorhexidinein a sterile
fashion, and a sterile drape was applied covering the operative
field. A sterile gown and sterile gloves were used for the
procedure. Local anesthesia was provided with 1% Lidocaine.

Under CT fluoroscopic guidance, 18 gauge percutaneous entry needle
advanced to the collection. Amplatz wire advanced within the
collection easily, position confirmed on CT. Tract dilated to
facilitate placement of a 14 French pigtail drain catheter, position
within the central dependent aspect of the collection. Bloody
material returned. Catheter was secured externally 0 Prolene suture
and StatLock and placed to Pleur-evac -20 cm H2O drainage. Site
covered with sterile gauze and Vaseline gauze dressing. The patient
tolerated the procedure well.

COMPLICATIONS:
None immediate
FINDINGS: Loculated right apical collection was localized. 14 French pigtail
drain catheter placed as chest drain.
IMPRESSION: 1. Technically successful CT-guided right chest drain placement

## 2021-04-03 IMAGING — DX DG CHEST 1V PORT
1 series · 1 of 1 positions shown · non-contrast
Comparison: CT [DATE].  Chest x-ray [DATE].

CLINICAL DATA: Hemoptysis.

EXAM:
PORTABLE CHEST 1 VIEW

[chest ap]
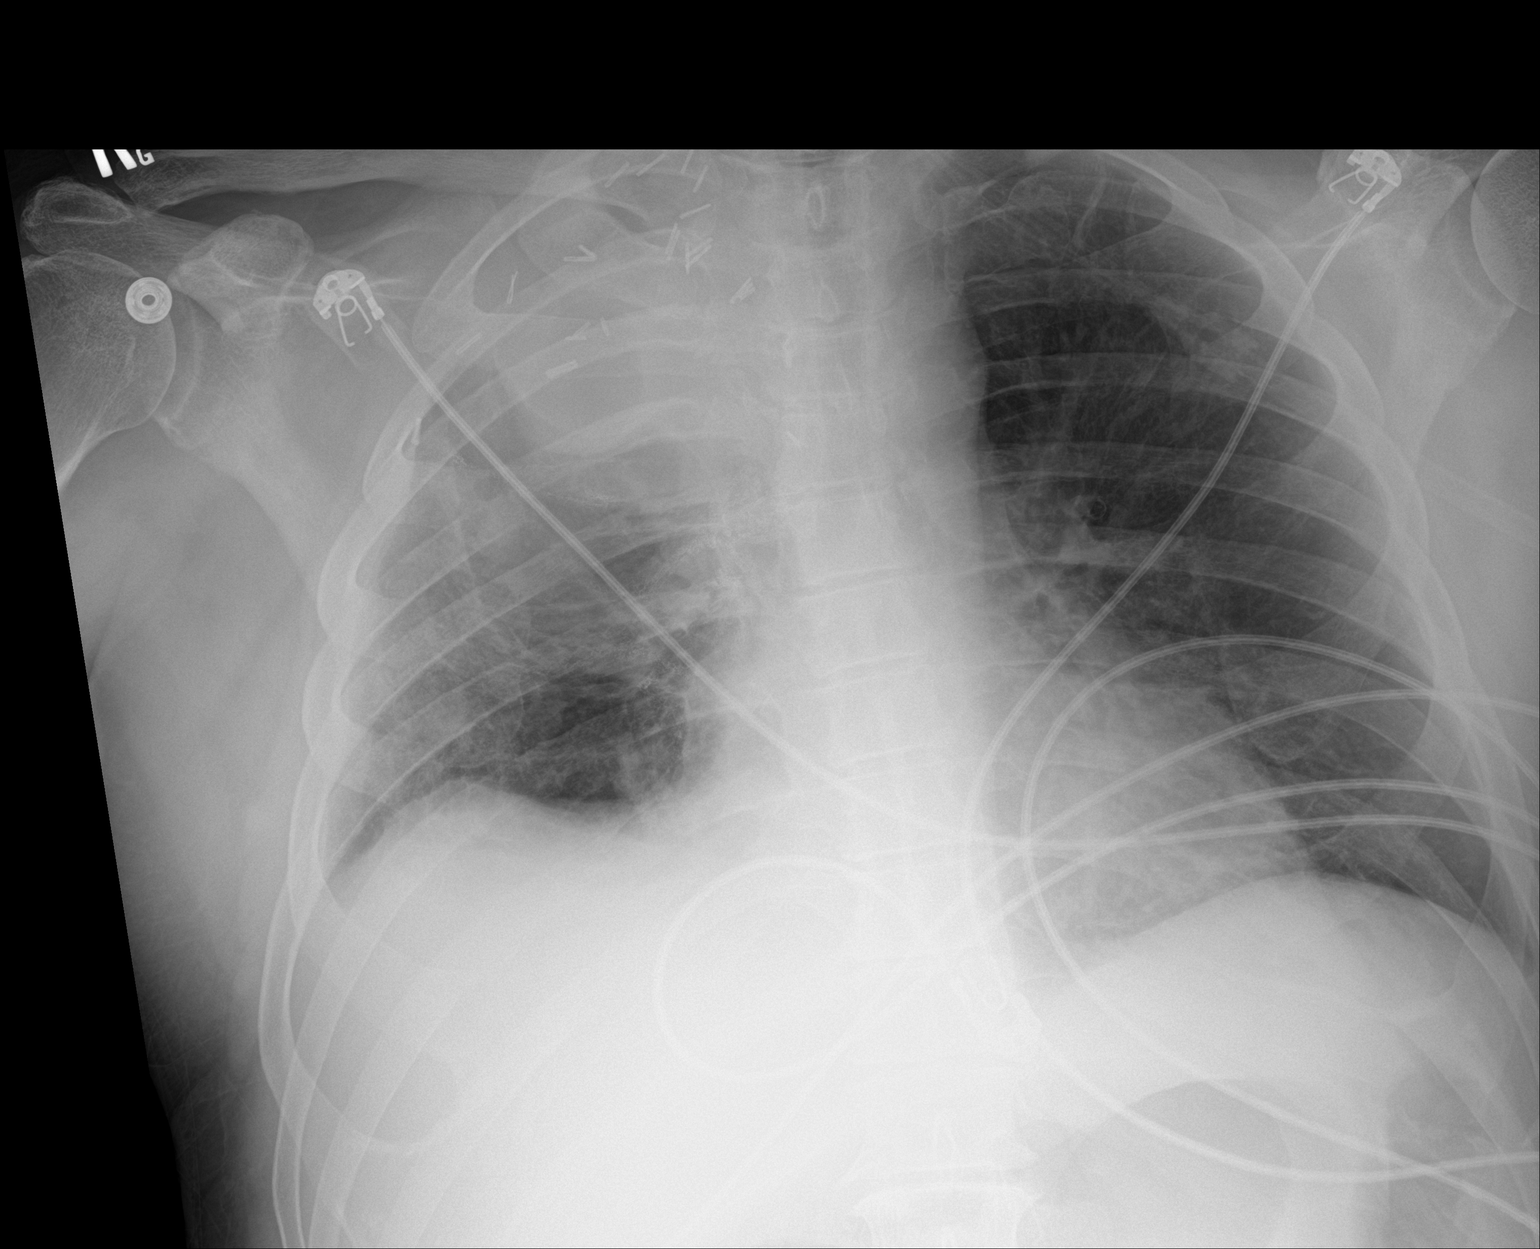

[1 of 1 positions shown; findings below may reference images not displayed]

FINDINGS: Heart size stable. Large right apical fluid collection/mass again
noted without interim change. Low lung volumes with persistent
bibasilar atelectasis. Mild bibasilar infiltrates again noted.
Stable right-sided pleural thickening. No pneumothorax. Surgical
clips right chest. Mild thoracic spine scoliosis.
IMPRESSION: 1. Large right apical fluid collection/mass again noted without
interim change.

2. Low lung volumes with persistent bibasilar atelectasis. Mild
bibasilar infiltrates again noted.

## 2021-04-03 MED ORDER — MIDAZOLAM HCL 2 MG/2ML IJ SOLN
INTRAMUSCULAR | Status: AC
  Filled 2021-04-03: qty 2

## 2021-04-03 MED ORDER — LIDOCAINE HCL (PF) 1 % IJ SOLN
INTRAMUSCULAR | Status: AC | PRN
  Administered 2021-04-03: 10 mL via INTRADERMAL

## 2021-04-03 MED ORDER — IRBESARTAN 150 MG PO TABS
75.0000 mg | ORAL_TABLET | Freq: Every day | ORAL | Status: DC
  Administered 2021-04-04 – 2021-04-10 (×7): 75 mg via ORAL
  Filled 2021-04-03 (×7): qty 1

## 2021-04-03 MED ORDER — FENTANYL CITRATE (PF) 100 MCG/2ML IJ SOLN
25.0000 ug | INTRAMUSCULAR | Status: DC | PRN
  Administered 2021-04-05 – 2021-04-07 (×3): 25 ug via INTRAVENOUS
  Filled 2021-04-03 (×3): qty 2

## 2021-04-03 MED ORDER — FENTANYL CITRATE (PF) 100 MCG/2ML IJ SOLN
50.0000 ug | INTRAMUSCULAR | Status: DC | PRN
Start: 2021-04-03 — End: 2021-04-09
  Administered 2021-04-04 – 2021-04-09 (×8): 50 ug via INTRAVENOUS
  Filled 2021-04-03 (×10): qty 2

## 2021-04-03 MED ORDER — MIDAZOLAM HCL 2 MG/2ML IJ SOLN
INTRAMUSCULAR | Status: AC | PRN
Start: 2021-04-03 — End: 2021-04-03
  Administered 2021-04-03: 0.5 mg via INTRAVENOUS

## 2021-04-03 MED ORDER — SODIUM CHLORIDE 0.9 % IV SOLN
INTRAVENOUS | Status: AC
  Filled 2021-04-03: qty 250

## 2021-04-03 MED ORDER — FENTANYL CITRATE (PF) 100 MCG/2ML IJ SOLN
INTRAMUSCULAR | Status: AC | PRN
  Administered 2021-04-03: 25 ug via INTRAVENOUS

## 2021-04-03 MED ORDER — FENTANYL CITRATE (PF) 100 MCG/2ML IJ SOLN
INTRAMUSCULAR | Status: AC
  Filled 2021-04-03: qty 2

## 2021-04-03 NOTE — TOC Progression Note (Signed)
Transition of Care [facility]) - Progression Note    Patient Details  Name: Christopher Carter MRN: [id number] Date of Birth: Sep 05, 1961  [facility] CM/SW Contact  Leeroy Cha, RN Phone Number: 04/03/2021, 8:07 AM  Clinical Narrative:    [id number] chrt condition deteriorated acutely early this AM with fever, hemoptysis and hypotension. He has now been moved to ICU -worsening findings on CTA of the Chest -loculated now gas filled area in right upper lobe cavity possibly communicating with the airway. He was sleeping deeply when I arrived at bedside, appeared very pale and wek, blood pressure is low. I spoke with his wife outside of the room-this is an unexpected turn of events and she is both sad and frustrated. She understandably feels a sense of urgency in getting the post operative issues resolved involving the affected lung-multiple courses of antibiotics have not resolved the issue. Cardio thoraic surg note\ 14 Days Post-Op Procedure(s) (LRB): VIDEO BRONCHOSCOPY WITHOUT FLUORO (N/A) Subjective: [name] had been making progress and was doing well 2 days ago. Yesterday had a couple of small episodes of hemoptysis, then later became short of breath and spiked fever to 104. He was started on Vanco and Zosyn. A CT of the chest was performed.   This evening temp is down. He is very tired. C/o pain right hip.    TOC following for progression and toc needs  Expected Discharge Plan and Services                                                 Social Determinants of Health (SDOH) Interventions    Readmission Risk Interventions No flowsheet data found.

## 2021-04-03 NOTE — Progress Notes (Signed)
15 Days Post-Op Procedure(s) (LRB): VIDEO BRONCHOSCOPY WITHOUT FLUORO (N/A) Subjective: Feels better today. Still has pain in shoulder  Objective: Vital signs in last 24 hours: Temp:  [97.2 F (36.2 C)-97.9 F (36.6 C)] 97.8 F (36.6 C) (07/26 1600) Pulse Rate:  [88-113] 106 (07/26 1900) Cardiac Rhythm: Sinus tachycardia (07/26 1900) Resp:  [12-21] 16 (07/26 1900) BP: (97-148)/(50-74) 133/70 (07/26 1900) SpO2:  [97 %-100 %] 99 % (07/26 1900)  Hemodynamic parameters for last 24 hours:    Intake/Output from previous day: 07/25 0701 - 07/26 0700 In: 928.4 [I.V.:10; IV Piggyback:918.4] Out: 2305 [Urine:2305] Intake/Output this shift: No intake/output data recorded.  General appearance: alert, cooperative, and no distress Neurologic: intact No air leak, ~ 400 ml of bloody fluid in pleuravac  Lab Results: Recent Labs    04/02/21 0802 04/03/21 0230  WBC 14.0* 11.6*  HGB 9.9* 8.4*  HCT 31.4* 27.4*  PLT 296 232   BMET:  Recent Labs    04/02/21 0555 04/02/21 0850 04/03/21 0230  NA 134*  --  137  K 5.5* 4.0 3.7  CL 96*  --  99  CO2 28  --  31  GLUCOSE 133*  --  154*  BUN 16  --  17  CREATININE 0.69  --  0.51*  CALCIUM 8.6*  --  8.4*    PT/INR:  Recent Labs    04/03/21 0230  LABPROT 14.8  INR 1.2   ABG    Component Value Date/Time   PHART 7.458 (H) 02/26/2021 0630   HCO3 28.7 (H) 02/26/2021 0630   TCO2 30 02/26/2021 0630   ACIDBASEDEF 1.3 01/18/2021 1504   O2SAT 96.0 02/26/2021 0630   CBG (last 3)  Recent Labs    04/02/21 2051 04/03/21 0748 04/03/21 1712  GLUCAP 313* 188* 299*    Assessment/Plan: S/P Procedure(s) (LRB): VIDEO BRONCHOSCOPY WITHOUT FLUORO (N/A) Christopher Carter looks dramatically better today Afebrile with antibiotics. WBC down slightly  Ct guided drainage of pleural space successful. Has about 400 ml of bloody fluid so far. Does not appear purulent  Will recheck CXR in AM   LOS: 15 days    Christopher Carter 04/03/2021

## 2021-04-03 NOTE — Procedures (Signed)
  Procedure: CT guided R chest tube placement 59F to apex EBL:   minimal Complications:  none immediate  See full dictation in [facility].  Dillard Cannon MD Main # [phone] Pager  [phone]

## 2021-04-03 NOTE — Progress Notes (Signed)
PT Cancellation Note  Patient Details Name: [name] MRN: [id number] DOB: 25-May-1961   Cancelled Treatment:    Reason Eval/Treat Not Completed: Patient at procedure or test/unavailable. Chart reviewed. Procedure planned for today by IR. Will hold PT today and check back another day.    [facility]  Office: [phone] Pager: [phone]

## 2021-04-03 NOTE — Progress Notes (Signed)
Chief Complaint: Patient was seen in consultation today for right chest drain placement   Referring Physician(s): Dr. Roxan Hockey  Supervising Physician: Arne Cleveland  Patient Status: [name]  History of Present Illness: Christopher Carter is a 60 y.o. male with hx of stage IV lung cancer s/p RUL lobectomy back in May. Developed loculated right apical effusion requiring chest tube placement on 6/20, with eventual subsequent removal. Has now been admitted with recurrent hemoptysis and fever. Imaging shows large recurrent right apical complex fluid collection. CT surgery following as well and recommends placement of new drain. IR consulted for drain placement. PMHx, meds, labs, imaging, allergies reviewed. Has been NPO today as directed.    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   Anxiety    Depression    Hypertension    Pneumonia    1990's    Past Surgical History:  Procedure Laterality Date   ANKLE FRACTURE SURGERY Right    BRONCHIAL BIOPSY  01/05/2021   Procedure: BRONCHIAL BIOPSIES;  Surgeon: Collene Gobble, MD;  Location: [place];  Service: Cardiopulmonary;;   BRONCHIAL BRUSHINGS  01/05/2021   Procedure: BRONCHIAL BRUSHINGS;  Surgeon: Collene Gobble, MD;  Location: [place];  Service: Cardiopulmonary;;   BRONCHIAL NEEDLE ASPIRATION BIOPSY  01/05/2021   Procedure: BRONCHIAL NEEDLE ASPIRATION BIOPSIES;  Surgeon: Collene Gobble, MD;  Location: [place];  Service: Cardiopulmonary;;   COLON SURGERY  2017   bowel obstruction surgery per pt    ENDOBRONCHIAL ULTRASOUND N/A 01/05/2021   Procedure: ENDOBRONCHIAL ULTRASOUND;  Surgeon: Collene Gobble, MD;  Location: [place];  Service: Cardiopulmonary;  Laterality: N/A;   INTERCOSTAL NERVE BLOCK Right 01/22/2021   Procedure: INTERCOSTAL NERVE BLOCK;  Surgeon: [name], MD;  Location: [place];  Service: Thoracic;  Laterality: Right;   LAPAROSCOPY N/A 04/25/2017    Procedure: LAPAROSCOPY ASSISTED ILEO-CECECTOMY WITH SMALL BOWEL RESECTION WITH ANASTOMOSIS;  Surgeon: Excell Seltzer, MD;  Location: [facility];  Service: General;  Laterality: N/A;   LYMPH NODE DISSECTION Right 01/22/2021   Procedure: LYMPH NODE DISSECTION;  Surgeon: [name], MD;  Location: [place];  Service: Thoracic;  Laterality: Right;   THORACOTOMY Right 01/22/2021   Procedure: THORACOTOMY;  Surgeon: [name], MD;  Location: [place];  Service: Thoracic;  Laterality: Right;   VIDEO ASSISTED THORACOSCOPY (VATS)/ LOBECTOMY Right 01/22/2021   Procedure: VIDEO ASSISTED THORACOSCOPY (VATS)/RIGHT UPPER LOBECTOMY;  Surgeon: [name], MD;  Location: [place];  Service: Thoracic;  Laterality: Right;   VIDEO BRONCHOSCOPY N/A 01/05/2021   Procedure: VIDEO BRONCHOSCOPY WITH FLUORO;  Surgeon: Collene Gobble, MD;  Location: [place];  Service: Cardiopulmonary;  Laterality: N/A;   VIDEO BRONCHOSCOPY N/A 03/19/2021   Procedure: VIDEO BRONCHOSCOPY WITHOUT FLUORO;  Surgeon: [name], DO;  Location: [place] [facility];  Service: Endoscopy;  Laterality: N/A;  need cryo probe available    Allergies: Oxycodone hcl and Bupropion  Medications:  Current Facility-Administered Medications:    (feeding supplement) PROSource Plus liquid 30 mL, 30 mL, Oral, BID BM, Gonfa, Taye T, MD, 30 mL at 04/02/21 1118   acetaminophen (TYLENOL) suppository 650 mg, 650 mg, Rectal, Q8H PRN, Blount, Scarlette Shorts T, NP   acetaminophen (TYLENOL) tablet 1,000 mg, 1,000 mg, Oral, Q8H PRN, Tamala Julian, Rondell A, MD, 1,000 mg at 04/02/21 0539   azithromycin (ZITHROMAX) tablet 250 mg, 250 mg, Oral, Daily, Lane Hacker L, DO, 250 mg at 04/03/21 1005   benzonatate (TESSALON) capsule 200  mg, 200 mg, Oral, BID, Acquanetta Chain, DO, 200 mg at 04/03/21 1005   celecoxib (CELEBREX) capsule 200 mg, 200 mg, Oral, BID, Lane Hacker L, DO, 200 mg at 04/03/21 1005   Chlorhexidine Gluconate Cloth 2 % PADS 6 each, 6  each, Topical, Daily, Wendee Beavers T, MD, 6 each at 04/03/21 1005   diazepam (VALIUM) tablet 1 mg, 1 mg, Oral, Q8H, Loistine Chance, MD, 1 mg at 04/03/21 0703   diazepam (VALIUM) tablet 10 mg, 10 mg, Oral, Daily PRN, Lane Hacker L, DO, 10 mg at 03/30/21 1459   diphenhydrAMINE (BENADRYL) injection 12.5 mg, 12.5 mg, Intravenous, Q6H PRN **OR** diphenhydrAMINE (BENADRYL) 12.5 MG/5ML elixir 12.5 mg, 12.5 mg, Oral, Q6H PRN, Tamala Julian, Rondell A, MD   feeding supplement (ENSURE ENLIVE / ENSURE PLUS) liquid 237 mL, 237 mL, Oral, BID BM, Gonfa, Taye T, MD, 237 mL at 04/02/21 1002   fentaNYL (DURAGESIC) 100 MCG/HR 1 patch, 1 patch, Transdermal, Q72H, Loistine Chance, MD, 1 patch at 04/03/21 1006   fentaNYL (SUBLIMAZE) injection 25 mcg, 25 mcg, Intravenous, Q1H PRN, Loistine Chance, MD, 25 mcg at 04/02/21 1843   fentaNYL (SUBLIMAZE) injection 50 mcg, 50 mcg, Intravenous, Daily PRN, Acquanetta Chain, DO, 50 mcg at 03/27/21 1434   ferrous sulfate tablet 325 mg, 325 mg, Oral, Q breakfast, Adhikari, Amrit, MD, 325 mg at 04/03/21 0704   furosemide (LASIX) injection 40 mg, 40 mg, Intravenous, Once, Blount, Scarlette Shorts T, NP   gabapentin (NEURONTIN) capsule 300 mg, 300 mg, Oral, BID WC, Smith, Rondell A, MD, 300 mg at 04/03/21 0814   gabapentin (NEURONTIN) capsule 600 mg, 600 mg, Oral, QHS, Smith, Rondell A, MD, 600 mg at 04/02/21 2114   guaiFENesin (MUCINEX) 12 hr tablet 600 mg, 600 mg, Oral, BID, Adhikari, Amrit, MD, 600 mg at 04/03/21 1005   insulin aspart (novoLOG) injection 0-15 Units, 0-15 Units, Subcutaneous, TID WC, Gonfa, Taye T, MD, 3 Units at 04/03/21 0816   insulin aspart (novoLOG) injection 0-5 Units, 0-5 Units, Subcutaneous, QHS, Gonfa, Taye T, MD, 4 Units at 04/02/21 2116   insulin aspart (novoLOG) injection 4 Units, 4 Units, Subcutaneous, TID WC, Gonfa, Taye T, MD, 4 Units at 04/01/21 1825   insulin glargine (LANTUS) injection 10 Units, 10 Units, Subcutaneous, BID, Cyndia Skeeters, Taye T, MD, 10 Units at 04/03/21 1005    irbesartan (AVAPRO) tablet 300 mg, 300 mg, Oral, Daily, Smith, Rondell A, MD, 300 mg at 04/03/21 1005   latanoprost (XALATAN) 0.005 % ophthalmic solution 1 drop, 1 drop, Both Eyes, QHS, Smith, Rondell A, MD, 1 drop at 04/03/21 0035   levalbuterol (XOPENEX) nebulizer solution 0.63 mg, 0.63 mg, Nebulization, Q6H, Gonfa, Taye T, MD, 0.63 mg at 04/03/21 0734   lidocaine (LIDODERM) 5 % 1 patch, 1 patch, Transdermal, Q24H, Smith, Rondell A, MD, 1 patch at 04/02/21 1641   morphine (MSIR) tablet 7.5 mg, 7.5 mg, Oral, Q4H PRN, Loistine Chance, MD, 7.5 mg at 04/03/21 0406   multivitamin with minerals tablet 1 tablet, 1 tablet, Oral, Daily, Adhikari, Amrit, MD, 1 tablet at 04/03/21 1005   naloxone (NARCAN) injection 0.4 mg, 0.4 mg, Intravenous, PRN **AND** sodium chloride flush (NS) 0.9 % injection 9 mL, 9 mL, Intravenous, PRN, Smith, Rondell A, MD   ondansetron (ZOFRAN) tablet 4 mg, 4 mg, Oral, Q6H PRN **OR** ondansetron (ZOFRAN) injection 4 mg, 4 mg, Intravenous, Q6H PRN, Smith, Rondell A, MD   ondansetron (ZOFRAN) injection 4 mg, 4 mg, Intravenous, Q6H PRN, Tamala Julian, Rondell A, MD   pantoprazole (PROTONIX)  EC tablet 40 mg, 40 mg, Oral, Daily, Smith, Rondell A, MD, 40 mg at 04/03/21 1005   piperacillin-tazobactam (ZOSYN) IVPB 3.375 g, 3.375 g, Intravenous, Q8H, Green, Terri L, RPH, Last Rate: 12.5 mL/hr at 04/03/21 0816, 3.375 g at 04/03/21 0816   polyethylene glycol (MIRALAX / GLYCOLAX) packet 17 g, 17 g, Oral, Daily PRN, Tamala Julian, Rondell A, MD   predniSONE (DELTASONE) tablet 20 mg, 20 mg, Oral, Q breakfast, Olalere, Adewale A, MD, 20 mg at 04/03/21 0813   QUEtiapine (SEROQUEL) tablet 100 mg, 100 mg, Oral, QHS, Smith, Rondell A, MD, 100 mg at 04/02/21 2112   sertraline (ZOLOFT) tablet 100 mg, 100 mg, Oral, Daily, Tamala Julian, Rondell A, MD, 100 mg at 04/03/21 1005   sodium chloride flush (NS) 0.9 % injection 3 mL, 3 mL, Intravenous, Q12H, Smith, Rondell A, MD, 3 mL at 04/03/21 1006   tranexamic acid (CYKLOKAPRON) IVPB  1,000 mg, 1,000 mg, Intravenous, Once, Carlis Abbott, Laura P, DO   vancomycin (VANCOREADY) IVPB 1500 mg/300 mL, 1,500 mg, Intravenous, Q12H, Green, Terri L, RPH, Last Rate: 150 mL/hr at 04/03/21 1003, 1,500 mg at 04/03/21 1003    History reviewed. No pertinent family history.  Social History   Socioeconomic History   Marital status: Married    Spouse name: Not on file   Number of children: Not on file   Years of education: Not on file   Highest education level: Not on file  Occupational History   Not on file  Tobacco Use   Smoking status: Never   Smokeless tobacco: Never  Vaping Use   Vaping Use: Never used  Substance and Sexual Activity   Alcohol use: Yes    Alcohol/week: 22.0 standard drinks    Types: 1 Cans of beer, 21 Shots of liquor per week    Comment: 3-4 shots of whiskey a day   Drug use: No   Sexual activity: Not on file  Other Topics Concern   Not on file  Social History Narrative   Not on file   Social Determinants of Health   Financial Resource Strain: Not on file  Food Insecurity: Not on file  Transportation Needs: Not on file  Physical Activity: Not on file  Stress: Not on file  Social Connections: Not on file    Review of Systems: A 12 point ROS discussed and pertinent positives are indicated in the HPI above.  All other systems are negative.  Review of Systems  Vital Signs: BP (!) 105/51   Pulse 96   Temp 97.9 F (36.6 C) (Oral)   Resp 16   Ht 5\' 10"  (1.778 m)   Wt 82.4 kg   SpO2 99%   BMI 26.07 kg/m   Physical Exam Constitutional:      General: He is not in acute distress.    Appearance: He is not ill-appearing.  HENT:     Mouth/Throat:     Mouth: Mucous membranes are moist.     Pharynx: Oropharynx is clear.  Cardiovascular:     Rate and Rhythm: Normal rate and regular rhythm.     Heart sounds: Normal heart sounds.  Pulmonary:     Effort: Pulmonary effort is normal. No respiratory distress.     Comments: Coarse BS on  right Neurological:     General: No focal deficit present.     Mental Status: He is alert and oriented to person, place, and time.  Psychiatric:        Mood and Affect: Mood normal.  Thought Content: Thought content normal.        Judgment: Judgment normal.     Imaging: DG Chest 2 View  Result Date: 03/15/2021 CLINICAL DATA:  Provided history: Chest pain. Additional history provided: Hemoptysis and pain after lobectomy for lung cancer. Shortness of breath, posterior right upper chest pain. EXAM: CHEST - 2 VIEW COMPARISON:  Prior chest radiograph 03/13/2021 and earlier. FINDINGS: Heart size within normal limits. Redemonstrated postsurgical changes within the right lung apex. Ill-defined airspace opacity within the right mid lung field, new as compared to the chest radiograph of 03/13/2021. The left lung remains clear. Persistent pleural thickening or trace pleural effusion at the right lung base. No evidence of pneumothorax. No acute bony abnormality identified. IMPRESSION: Nonspecific airspace disease within the right mid lung field, new from the chest radiographs of 03/13/2021. Primary consideration include pneumonia, atelectasis or hemorrhage. Redemonstrated postsurgical changes within the right lung apex. Persistent pleural thickening or trace pleural effusion at the right lung base. Electronically Signed   By: Kellie Simmering DO   On: 03/15/2021 13:44   DG Chest 2 View  Result Date: 03/13/2021 CLINICAL DATA:  Status post video assisted thorascopic surgery. EXAM: CHEST - 2 VIEW COMPARISON:  March 06, 2021. FINDINGS: The heart size and mediastinal contours are within normal limits. Left lung is clear. Stable postsurgical changes are noted in the right lung apex. Minimal right basilar subsegmental atelectasis or scarring is noted with small right pleural effusion. The visualized skeletal structures are unremarkable. IMPRESSION: Stable postsurgical changes seen in right lung apex. Minimal right  basilar subsegmental atelectasis or scarring is noted with probable small right pleural effusion. Electronically Signed   By: Marijo Conception M.D.   On: 03/13/2021 14:43   CT CHEST W CONTRAST  Result Date: 03/27/2021 CLINICAL DATA:  Persistent chest pain. History of right upper lobectomy and recent CT suspicious for recurrence. EXAM: CT CHEST WITH CONTRAST TECHNIQUE: Multidetector CT imaging of the chest was performed during intravenous contrast administration. CONTRAST:  24mL OMNIPAQUE IOHEXOL 350 MG/ML SOLN COMPARISON:  Multiple recent radiographs.  Chest CT 9 days ago. FINDINGS: Cardiovascular: Normal caliber thoracic aorta. Mild atherosclerosis. Coronary artery calcifications. Normal heart size. No pericardial effusion. No obvious pulmonary embolus on this non angiographic exam. Mediastinum/Nodes: There are enlarged right lower paratracheal nodes measuring 16 and 17 mm, unchanged allowing for differences in caliper placement. Additional smaller mediastinal lymph nodes are similar. No thyroid nodule. No esophageal wall thickening. Lungs/Pleura: History of right upper lobectomy. Loculated collection at the right lung apex lined by thick masslike nodularity. This is not significantly changed in the interim. This again measures at least 10 cm ostial dimension. There scattered calcifications. There is surrounding ground-glass opacity extending into the adjacent right lung with improvement from prior exam. Small right pleural effusion is similar there is a posterior pleural based nodule in the right hemithorax measuring 17 mm, series 2, image 46. Upper Abdomen: No acute findings. Musculoskeletal: Postsurgical change of right posterior upper ribs. There is no frank bony destruction related to right apical masslike opacity. No discrete osseous lesions. No definite chest wall extension of apical lesion. IMPRESSION: 1. No significant change in loculated collection at the right lung apex lined by thick masslike  nodularity, suspicious for malignancy recurrence. 2. Mild improvement in surrounding ground-glass opacity in the adjacent right lung, likely representing improving hemorrhage. 3. Unchanged small right pleural effusion. Unchanged pleural based nodule in the posterior right hemithorax. 4. Enlarged right lower paratracheal lymph nodes are similar to  recent prior. 5. Coronary artery calcifications. Aortic Atherosclerosis (ICD10-I70.0). Electronically Signed   By: Keith Rake M.D.   On: 03/27/2021 19:58   CT Chest W Contrast  Result Date: 03/18/2021 CLINICAL DATA:  Hemoptysis.  Recent diagnosis of lung cancer EXAM: CT CHEST WITH CONTRAST TECHNIQUE: Multidetector CT imaging of the chest was performed during intravenous contrast administration. CONTRAST:  25mL OMNIPAQUE IOHEXOL 300 MG/ML  SOLN COMPARISON:  02/26/2021 FINDINGS: Cardiovascular: Normal heart size. No pericardial effusion. Extensive coronary calcification. No acute vascular finding. Mediastinum/Nodes: Tubular centrally low-density structure along the upper right mediastinum is contiguous with a complex right apical mass and collection. There is right paratracheal nodule measuring 17 mm in diameter, suspicious for metastatic node. Lungs/Pleura: History of right upper lobectomy for cancer. There is a recently drained and re-accumulated loculated collection at the right apex lined by thick masslike nodularity, up to 10 cm on axial slices. Small right pleural effusion at the base which appears more simple and dependent. Airspace opacity in the right lung and to a much lesser extent in the left lung. No discrete cavity or airway mass. Upper Abdomen: Negative Musculoskeletal: The right apical mass is in close continuity with the upper ribs but no erosion is detected. No hematogenous osseous metastatic disease noted either. IMPRESSION: 1. 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 2. The recurrent right apical  fluid collection is lined by masslike nodularity most consistent with recurrence. Recommend histologic correlation. 3. Right paratracheal adenopathy. 4. Small right pleural effusion. Electronically Signed   By: Monte Fantasia M.D.   On: 03/18/2021 04:49   CT Angio Chest Pulmonary Embolism (PE) W or WO Contrast  Result Date: 04/02/2021 CLINICAL DATA:  Shortness of breath and cough. History of pulmonary adenocarcinoma with right upper lobectomy. Undergoing chemotherapy EXAM: CT ANGIOGRAPHY CHEST WITH CONTRAST TECHNIQUE: Multidetector CT imaging of the chest was performed using the standard protocol during bolus administration of intravenous contrast. Multiplanar CT image reconstructions and MIPs were obtained to evaluate the vascular anatomy. CONTRAST:  165mL OMNIPAQUE IOHEXOL 350 MG/ML SOLN COMPARISON:  Chest CT 03/27/2021 FINDINGS: Cardiovascular: Suboptimal bolus density/timing exacerbated by streak artifact and motion. No evidence of central, lobar, or (when occasionally visible) segmental branch PE. No cardiomegaly or pericardial effusion. No acute aortic finding. Diffuse atheromatous calcification of the coronaries. Mediastinum/Nodes: No noted adenopathy or inflammation. Lungs/Pleura: Right apical collection with peripheral nodularity and bulging of adjacent mediastinal structures, 10.6 Cm in maximal transverse span. This collection contains new gas and there is progressive adjacent pulmonary opacification. There is also new infiltrate in the left lower lung with the same airspace pattern. The major central airways are clear. The dependent right pleural effusion is unchanged in size, small to moderate. Upper Abdomen: Negative Musculoskeletal: No acute finding no evidence of direct erosion or hematogenous bony metastasis. Review of the MIP images confirms the above findings. IMPRESSION: 1. New gas in the complex right apical collection, now likely communicating with the airways. Attendant increase in  bilateral airspace disease which could be alveolar hemorrhage, endobronchial debris spread, or pneumonia. 2. Suboptimal pulmonary artery angiogram with no evidence of main or lobar pulmonary embolism. 3. Notable nodularity at the periphery of the right apical collection concerning for tumor recurrence. Electronically Signed   By: Monte Fantasia M.D.   On: 04/02/2021 11:09   DG Chest Port 1 View  Result Date: 04/03/2021 CLINICAL DATA:  Hemoptysis. EXAM: PORTABLE CHEST 1 VIEW COMPARISON:  CT 04/02/2021.  Chest x-ray 04/02/2021. FINDINGS: Heart size  stable. Large right apical fluid collection/mass again noted without interim change. Low lung volumes with persistent bibasilar atelectasis. Mild bibasilar infiltrates again noted. Stable right-sided pleural thickening. No pneumothorax. Surgical clips right chest. Mild thoracic spine scoliosis. IMPRESSION: 1. Large right apical fluid collection/mass again noted without interim change. 2. Low lung volumes with persistent bibasilar atelectasis. Mild bibasilar infiltrates again noted. Electronically Signed   By: Marcello Moores  Register   On: 04/03/2021 07:26   DG Chest Port 1 View  Result Date: 04/02/2021 CLINICAL DATA:  Shortness of breath and tachycardia. EXAM: PORTABLE CHEST 1 VIEW COMPARISON:  03/27/2021 FINDINGS: Stable cardiomediastinal silhouette. Right upper lobe opacity is again noted corresponding to loculated pleural fluid and recurrent tumor as noted on recent chest CT from 03/27/2021. No pleural effusion or edema. New airspace opacity identified within the left lower lobe. The visualized osseous structures are unremarkable. IMPRESSION: 1. New left lower lobe airspace opacity concerning for pneumonia. 2. Stable right upper lobe opacity corresponding to loculated pleural fluid and recurrent tumor. Electronically Signed   By: Kerby Moors M.D.   On: 04/02/2021 06:44   DG Chest Port 1 View  Result Date: 03/27/2021 CLINICAL DATA:  Right upper lobectomy EXAM:  PORTABLE CHEST 1 VIEW COMPARISON:  03/24/2021 FINDINGS: Postsurgical changes in the right upper lobe. Persistent right upper lobe pulmonary mass unchanged from 03/24/2021. No pleural effusion or pneumothorax. Stable cardiomediastinal silhouette. No aggressive osseous lesion. IMPRESSION: Postsurgical changes in the right upper lobe. Persistent right upper lobe pulmonary mass unchanged from 03/24/2021. Electronically Signed   By: Kathreen Devoid   On: 03/27/2021 11:50   DG Chest Port 1 View  Result Date: 03/24/2021 CLINICAL DATA:  Respiration abnormal EXAM: PORTABLE CHEST 1 VIEW COMPARISON:  Radiograph 03/18/2021, CT 03/18/2021 FINDINGS: Large RIGHT upper lobe apical mass again demonstrated. Stable cardiac silhouette. Mild central venous congestion is slightly increased from prior. No pneumothorax. No pleural fluid. IMPRESSION: 1. Mild increase in venous pulmonary congestion. 2. Stable RIGHT apical mass. Electronically Signed   By: Suzy Bouchard M.D.   On: 03/24/2021 09:37   DG Chest Portable 1 View  Result Date: 03/18/2021 CLINICAL DATA:  Hemoptysis for several hours, history of known lung carcinoma EXAM: PORTABLE CHEST 1 VIEW COMPARISON:  03/15/2021 FINDINGS: Cardiac shadow is within normal limits. Postsurgical changes are noted in the right apex with persistent soft tissue density in the apex stable from the prior exam. The previously seen right mid lung airspace opacity has resolved in the interval. Left lung remains clear. No bony abnormality is noted. IMPRESSION: Clearing of previously seen right mid lung airspace opacity. Remainder of the exam is stable from the prior study. Electronically Signed   By: Inez Catalina M.D.   On: 03/18/2021 03:18   DG CHEST PORT 1 VIEW  Result Date: 03/06/2021 CLINICAL DATA:  Pneumothorax.  Hypoxia. EXAM: PORTABLE CHEST 1 VIEW COMPARISON:  03/04/2021.  03/02/2021.  03/01/2021.  CT 02/26/2021. FINDINGS: Postsurgical changes in the right apex again noted. Again patient  status post removal of right apical chest tube. Slight reaccumulation of right apical pleural fluid cannot be excluded. Mild left upper lung infiltrate. No pneumothorax. Low lung volumes. Tiny bilateral basilar pleural effusions cannot be excluded. Stable cardiomegaly. IMPRESSION: 1. Postsurgical changes right apex again noted. Again patient status post removal of right apical chest tube. Slight reaccumulation of right apical pleural fluid cannot be excluded. No pneumothorax. 2. Mild left upper lung infiltrate. Low lung volumes. Tiny bilateral basilar pleural effusions cannot be excluded. Electronically Signed  By: [place]   On: 03/06/2021 07:45    Labs:  CBC: Recent Labs    03/30/21 0339 04/01/21 0353 04/02/21 0802 04/03/21 0230  WBC 21.8* 11.3* 14.0* 11.6*  HGB 9.2* 8.6* 9.9* 8.4*  HCT 28.9* 28.1* 31.4* 27.4*  PLT 427* 339 296 232    COAGS: Recent Labs    01/18/21 1500 02/25/21 2204 03/18/21 0246 03/18/21 1609 03/19/21 0215 04/03/21 0230  INR 1.0   < > 1.0 1.0 1.1 1.2  APTT 28  --   --   --  30  --    < > = values in this interval not displayed.    BMP: Recent Labs    03/28/21 0335 04/01/21 0353 04/02/21 0555 04/02/21 0850 04/03/21 0230  NA 138 138 134*  --  137  K 4.4 3.7 5.5* 4.0 3.7  CL 99 99 96*  --  99  CO2 29 30 28   --  31  GLUCOSE 288* 220* 133*  --  154*  BUN 13 19 16   --  17  CALCIUM 8.9 8.4* 8.6*  --  8.4*  CREATININE 0.40* 0.43* 0.69  --  0.51*  GFRNONAA >60 >60 >60  --  >60    LIVER FUNCTION TESTS: Recent Labs    02/25/21 2204 02/27/21 0026 03/15/21 1254 03/27/21 0749 03/28/21 0335 04/01/21 0353 04/02/21 0555  BILITOT 1.1 1.0 0.8  --   --   --  1.3*  AST 21 21 12*  --   --   --  46*  ALT 28 19 36  --   --   --  31  ALKPHOS 103 75 94  --   --   --  86  PROT 7.1 5.9* 6.7  --   --   --  6.7  ALBUMIN 3.6 3.0* 3.1* 2.6* 2.6* 2.5* 2.7*    TUMOR MARKERS: No results for input(s): AFPTM, CEA, CA199, CHROMGRNA in the last 8760  hours.  Assessment and Plan: Recurrent right apical complex fluid collection, possible abscess Imaging reviewed. Plan for image guided placement of drain. Labs reviewed. Risks and benefits discussed with the patient including bleeding, infection, damage to adjacent structures, lung perforation/fistula connection, and sepsis.  All of the patient's questions were answered, patient is agreeable to proceed. Consent signed and in chart.   Thank you for this interesting consult.  I greatly enjoyed meeting Wade A Mooneyham and look forward to participating in their care.  A copy of this report was sent to the requesting provider on this date.  Electronically Signed: Ascencion Dike, PA-C 04/03/2021, 10:38 AM   I spent a total of 20 minutes in face to face in clinical consultation, greater than 50% of which was counseling/coordinating care for right apical chest drain

## 2021-04-03 NOTE — Progress Notes (Signed)
NAME:  Christopher Carter, MRN:  [id number], DOB:  01-May-1961, LOS: 53 ADMISSION DATE:  03/18/2021, CONSULTATION DATE:  03/18/2021 REFERRING MD:  Dr. Tamala Carter, CHIEF COMPLAINT:  Hemoptysis    History of Present Illness:  Christopher Carter is a 61 y.o. 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 Patient has been treated at this facility for similar presentation and underwent video bronchoscopy and IR placed chest tube for pleural effusion with Dr. Roxan Carter. He was discharged in stable condition with plans to follow up with cardiothoracic surgery and oncology.  He presented this admission with recurrent hemoptysis that started 4hr prior to admission with associated right upper back pain that radiates to upper neck. Patient was transferred from [place] to [facility] for cardiothoracic, pulmonary, and IR consults. Vital signs stable, WBC elevated at 18.1. CT chest was obtained and consistent with alveolar hemorrhage localized to the right with right apical fluid collection.   Pertinent  Medical History  Stage IIIa adenocarcinoma now S/P right upper lobectomy with node dissection 01/22/2021, on 5L [place] at baseline, tobacco abuse, HTN, and depression   Significant Hospital Events:  7/10 admitted with recurrent hemoptysis  7/11 bronch- nonfocal source, had bloody frothy secretions in all airways, petechiae on bronchi Receiving radiation therapy Pain is better controlled 7/25 worsening hypoxia and new temp to 104 7/26 chest tube by IR planned  Interim History / Subjective:  Down to 4L this AM.  Temp improved.  Overall looks much better then yesterday.  Subjectively feels better also. Wants to eat but is NPO for IR chest tube placement today.  Objective   Blood pressure (!) 128/59, pulse 98, temperature  (!) 97.2 F (36.2 C), temperature source Axillary, resp. rate 17, height 5\' 10"  (1.778 m), weight 82.4 kg, SpO2 100 %.        Intake/Output Summary (Last 24 hours) at 04/03/2021 0803 Last data filed at 04/03/2021 0700 Gross per 24 hour  Intake 928.35 ml  Output 2005 ml  Net -1076.65 ml    Filed Weights   03/18/21 0242 03/25/21 1542 04/02/21 1330  Weight: 79.4 kg 81.7 kg 82.4 kg    Examination: General: Adult male, resting in bed watching TV. Neuro: A&O x 3, no deficits. HEENT: [place]/AT. Sclerae anicteric. EOMI.  NRB in place. Cardiovascular: Tachy, regular, no M/R/G.  Lungs: Respirations even.  Diminished air entry. Abdomen: BS x 4, soft, NT/ND.  Musculoskeletal: No gross deformities, no edema.  Skin: Intact, warm, no rashes.  Resolved Hospital Problem list     Assessment & Plan:   Acute on chronic hypoxemic respiratory failure - presumed 2/2 PNA with probable lung abscess RUL. - Continue supplemental O2 as needed to maintain SpO2 > 90%. - Continue abx (vanc/zosyn). - Follow cultures. - TCTS contacted again 7/25, recommending IR chest tube as initial plan  Hemoptysis - improving. - Continue prednisone at 20 daily. - Monitor.  Stage IV adenocarcinoma of the lung s/p right upper lobectomy. - Supportive care for now. - F/u as outpatient.  Pain control - 2/2 above. - Continue pain control per palliative medicine.  DM - anticipate slight worsening of CBG's with steroids. - Continue SSI, lantus.  Dental pain / possible infection - had been treated with PenV as outpatient with planned root canal 7/11; however, this was postponed. - Dental surgery recommending abx and  outpatient follow up.   Best Practice   Diet/type: NPO w/ oral meds DVT prophylaxis: SCD GI prophylaxis: N/A Lines: N/A Foley:  N/A Code Status:  full code Last date of multidisciplinary goals of care discussion: Per primary   Christopher Carter, [place] For  pager details, please see AMION or use Epic chat  After 1900, please call Christopher Carter for cross coverage needs 04/03/2021, 8:03 AM

## 2021-04-03 NOTE — Progress Notes (Signed)
Discussed pain management with Dr. Hilma Favors of palliative medicine and unit charge nurse via secure chat and telephone. Verbal orders given to modify Fentanyl 39mcg every one hour prn for moderate pain unrelieved by Morphine tablet and Fentanyl 50 mcg every one hour prn severe pain. Patient aware of pain management plan and agrees to same.

## 2021-04-03 NOTE — Progress Notes (Signed)
PROGRESS NOTE  Christopher Carter:[id number] DOB: 1961/06/20   PCP: [name], MD  Patient is from: Home  DOA: 03/18/2021 LOS: 49  Chief complaints:  Chief Complaint  Patient presents with   Hemoptysis   Back Pain     Brief Narrative / Interim history: 60 year old M with PMH of stage IV adenocarcinoma s/p RUL lobectomy with node dissection on 5/16, recurrent hemoptysis, chronic RF on 5 L, tobacco abuse, HTN, depression, loculated hemithorax treated with IR chest tube placement, empiric antibiotics and prednisone taper and discharged to follow-up with CTS and radiation oncology.  He returned with cough with hemoptysis, right upper back pain, and increased oxygen requirement.  CT chest showed airspace disease in right lung representing alveolar hemorrhage with recurrence of apical fluid collection, nodular masslike lesion concerning for recurrence of malignancy and right peritracheal adenopathy.  He was admitted to [facility].  CTS, pulmonary IR and palliative consulted.  Started on IV cefepime, systemic steroid and tranexamic acid nebulization.  He underwent bronchoscopy that did not reveal active bleeding. Started on PCA Dilaudid by PMT for pain control, and transferred to [facility] for radiation oncology care.   Patient's hemoptysis resolved, oxygen requirement improved, and made good progression until 7/24 when he had small-volume hemoptysis on tissue papers.  Then, he spiked fever, tachycardia, tachypnea with increased oxygen requirement the early morning of 7/25.  CTA chest negative for large central PE but with new gas in complex right apical collection concerning for communication with the airways, adjacent increase in bilateral airspace disease.  Cultures obtained.  Started on broad-spectrum antibiotics with Vanco and Zosyn.  Transferred to stepdown unit.  CTS consulted and recommended drain placement by IR.   Subjective: Seen and examined earlier this morning.  No major events  overnight of this morning.  Reports improvement in his breathing.  Pain fairly controlled.  Some productive cough with blood-tinged but no frank hemoptysis.  Denies GI or UTI symptoms.  Patient's wife and sister-in-law at bedside.  Objective: Vitals:   04/03/21 0737 04/03/21 0800 04/03/21 0900 04/03/21 0915  BP:  (!) 113/55  (!) 105/51  Pulse:  (!) 101 99 96  Resp:  (!) 21 17 16   Temp:  97.9 F (36.6 C)    TempSrc:  Oral    SpO2: 100% 98% 98% 99%  Weight:      Height:        Intake/Output Summary (Last 24 hours) at 04/03/2021 1206 Last data filed at 04/03/2021 1151 Gross per 24 hour  Intake 928.35 ml  Output 2805 ml  Net -1876.65 ml   Filed Weights   03/18/21 0242 03/25/21 1542 04/02/21 1330  Weight: 79.4 kg 81.7 kg 82.4 kg    Examination:  GENERAL: No apparent distress.  Nontoxic. HEENT: MMM.  Vision and hearing grossly intact.  NECK: Supple.  No apparent JVD.  RESP: 99% on 5 L.  No IWOB.  Crackles over right lung fields. CVS:  RRR. Heart sounds normal.  ABD/GI/GU: BS+. Abd soft, NTND.  MSK/EXT:  Moves extremities. No apparent deformity. No edema.  SKIN: no apparent skin lesion or wound NEURO: Awake and alert. Oriented appropriately.  No apparent focal neuro deficit. PSYCH: Calm. Normal affect.   Procedures:  7/11-bronchoscopy  Microbiology summarized: [id number] and influenza PCR nonreactive. MRSA PCR screen negative. 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 s/p RUL lobectomy and node dissection by Dr. Koleen Nimrod on 5/16 Acute on chronic respiratory failure with hypoxia-on NRB.  CXR with new LLL PNA.  High risk for VTE. Severe sepsis: Febrile, tachycardic, tachypnea with acute on chronic hypoxic respiratory failure -7/10-CTA chest concerning for alveolar hemorrhage,  loculated fluid at right lung apex lined by masslike nodularity consistent with recurrence of malignancy and right peritracheal LAD -7/11-bronchoscopy on 7/11-no active hemorrhage -7/12-respiratory culture with normal respiratory flora -7/13-Rad/onc-started radiation.  Planned for 6 and half week -7/18-completed 7 days of IV cefepime.  -7/19-repeat CTA chest without significant change, may be improved hemorrhage -7/21-started on azithromycin.   -7/23-started on Augmentin for dental abscess -7/24-prednisone resumed at 20 mg daily due to recurrence of hemoptysis -7/25-fever, increased O2 requirement, tachycardia and tachypnea.  CXR with new LLL infiltrate.  CTA chest negative for large central PE but with new gas in complex right apical collection concerning for communication with the airways, adjacent increase in bilateral airspace disease.  CTS consulted and recommended IR drain Blood and sputum cultures obtained and started on vancomycin and Zosyn.   -7/26-significant improvement.  Blood culture and MRSA PCR negative.  Vancomycin discontinued.  Plan for IR drain placement.    Uncontrolled cancer-related pain: Rates his pain 6/10 today -Palliative medicine managing  -On fentanyl patch 100 mcg, p.o. morphine 7.5 mg PRN and IV fentanyl PRN -Celebrex and gabapentin. -On scheduled and as needed Valium for anxiety -Narcan as needed     Dental pain/possible infection: Was on penicillin V outpatient.  Plan for root canal Tx on 7/11 but postponed.  -In-house dental surgery consulted and recommended antibiotics and outpatient follow-up with his orthodontist -Now on Zosyn due to sepsis.   Anemia of chronic disease due to cancer: H&H relatively stable. Recent Labs    03/22/21 0212 03/23/21 0532 03/24/21 0224 03/25/21 0357 03/27/21 0749 03/28/21 0335 03/30/21 0339 04/01/21 0353 04/02/21 0802 04/03/21 0230  HGB 8.5* 8.6* 8.1* 8.3* 9.2* 9.0* 9.2* 8.6* 9.9* 8.4*  -CBC this morning     Essential hypertension: BP slightly soft. -Discontinued amlodipine. -Decreased Avapro to 75 mg daily -Intermittent boluses as needed  Controlled DM-2 with hyperglycemia: A1c 6.5%.  Hyperglycemia partly due to steroid. Recent Labs  Lab 04/02/21 0758 04/02/21 1118 04/02/21 1630 04/02/21 2051 04/03/21 0748  GLUCAP 167* 136* 160* 313* 188*  -Continue SSI moderate -NovoLog 4 units 3 times daily with meals -Lantus 10 units twice daily -May adjust based on his evening CBG -Further adjustment as appropriate    History of anxiety/depression:  -On Seroquel, Zoloft and Valium   GERD: -On  Protonix     Body mass index is 26.07 kg/m. Nutrition Problem: Increased nutrient needs Etiology: cancer and cancer related treatments Signs/Symptoms: estimated needs Interventions: Ensure Enlive (each supplement provides 350kcal and 20 grams of protein), MVI   DVT prophylaxis:  Place and maintain sequential compression device Start: 03/30/21 0804 Place and maintain sequential compression device Start: 03/29/21 0835 SCDs Start: 03/18/21 1527  Code Status: Full code Family Communication: Patient and/or RN.  Updated patient's wife at the bedside. Level of care: Stepdown Status is: Inpatient  Remains inpatient appropriate because:Hemodynamically unstable, Ongoing active pain requiring inpatient pain management, Ongoing diagnostic testing needed not appropriate for outpatient work up, IV treatments appropriate due to intensity of illness or inability to take PO, and Inpatient level of care appropriate due to severity of illness  Dispo: The patient is from: Home  Anticipated d/c is to: Home              Patient currently is not medically stable to d/c.   Difficult to place patient No       Consultants:  PCCM Cardiothoracic surgery IR Palliative medicine Oncology-signed off Radiation oncology Dental surgery-signed off   Sch Meds:  Scheduled Meds:  (feeding  supplement) PROSource Plus  30 mL Oral BID BM   benzonatate  200 mg Oral BID   celecoxib  200 mg Oral BID   Chlorhexidine Gluconate Cloth  6 each Topical Daily   diazepam  1 mg Oral Q8H   feeding supplement  237 mL Oral BID BM   fentaNYL  1 patch Transdermal Q72H   ferrous sulfate  325 mg Oral Q breakfast   furosemide  40 mg Intravenous Once   gabapentin  300 mg Oral BID WC   gabapentin  600 mg Oral QHS   guaiFENesin  600 mg Oral BID   insulin aspart  0-15 Units Subcutaneous TID WC   insulin aspart  0-5 Units Subcutaneous QHS   insulin aspart  4 Units Subcutaneous TID WC   insulin glargine  10 Units Subcutaneous BID   irbesartan  300 mg Oral Daily   latanoprost  1 drop Both Eyes QHS   levalbuterol  0.63 mg Nebulization Q6H   lidocaine  1 patch Transdermal Q24H   multivitamin with minerals  1 tablet Oral Daily   pantoprazole  40 mg Oral Daily   predniSONE  20 mg Oral Q breakfast   QUEtiapine  100 mg Oral QHS   sertraline  100 mg Oral Daily   sodium chloride flush  3 mL Intravenous Q12H   Continuous Infusions:  piperacillin-tazobactam (ZOSYN)  IV 3.375 g (04/03/21 0816)   tranexamic acid     PRN Meds:.acetaminophen, acetaminophen, diazepam, diphenhydrAMINE **OR** diphenhydrAMINE, fentaNYL (SUBLIMAZE) injection, fentaNYL (SUBLIMAZE) injection, morphine, naloxone **AND** sodium chloride flush, ondansetron **OR** ondansetron (ZOFRAN) IV, ondansetron (ZOFRAN) IV, polyethylene glycol  Antimicrobials: Anti-infectives (From admission, onward)    Start     Dose/Rate Route Frequency Ordered Stop   04/02/21 2100  vancomycin (VANCOREADY) IVPB 1500 mg/300 mL  Status:  Discontinued        1,500 mg 150 mL/hr over 120 Minutes Intravenous Every 12 hours 04/02/21 0740 04/03/21 1115   04/02/21 0900  vancomycin (VANCOREADY) IVPB 1500 mg/300 mL        1,500 mg 150 mL/hr over 120 Minutes Intravenous  Once 04/02/21 0724 04/02/21 1300   04/02/21 0800  piperacillin-tazobactam (ZOSYN) IVPB 3.375 g         3.375 g 12.5 mL/hr over 240 Minutes Intravenous Every 8 hours 04/02/21 0724     03/30/21 1130  amoxicillin-clavulanate (AUGMENTIN) 875-125 MG per tablet 1 tablet  Status:  Discontinued        1 tablet Oral Every 12 hours 03/30/21 1035 04/02/21 0716   03/29/21 1200  azithromycin (ZITHROMAX) tablet 250 mg  Status:  Discontinued       Note to Pharmacy: For dental infection   250 mg Oral Daily 03/29/21 1100 04/03/21 1115   03/19/21 0900  vancomycin (VANCOREADY) IVPB 1250 mg/250 mL  Status:  Discontinued        1,250 mg 166.7 mL/hr over 90 Minutes Intravenous Every 12 hours 03/18/21 1842 03/20/21 1059   03/18/21 2200  ceFEPIme (MAXIPIME) 2 g in sodium chloride 0.9 % 100 mL IVPB        2 g 200 mL/hr over 30  Minutes Intravenous Every 8 hours 03/18/21 1836 03/26/21 0835   03/18/21 1930  vancomycin (VANCOREADY) IVPB 1500 mg/300 mL        1,500 mg 150 mL/hr over 120 Minutes Intravenous  Once 03/18/21 1836 03/19/21 0734   03/18/21 1915  azithromycin (ZITHROMAX) 500 mg in sodium chloride 0.9 % 250 mL IVPB  Status:  Discontinued        500 mg 250 mL/hr over 60 Minutes Intravenous Every 24 hours 03/18/21 1822 03/19/21 0840   03/18/21 1800  penicillin v potassium (VEETID) tablet 500 mg  Status:  Discontinued       Note to Pharmacy: Start date : 03/14/21     500 mg Oral 4 times daily 03/18/21 1527 03/18/21 1842        I have personally reviewed the following labs and images: CBC: Recent Labs  Lab 03/28/21 0335 03/30/21 0339 04/01/21 0353 04/02/21 0802 04/03/21 0230  WBC 18.7* 21.8* 11.3* 14.0* 11.6*  NEUTROABS  --   --   --  12.6*  --   HGB 9.0* 9.2* 8.6* 9.9* 8.4*  HCT 28.6* 28.9* 28.1* 31.4* 27.4*  MCV 95.3 97.0 97.9 94.0 97.2  PLT 467* 427* 339 296 232   BMP &GFR Recent Labs  Lab 03/28/21 0335 04/01/21 0353 04/02/21 0555 04/02/21 0850 04/03/21 0230  NA 138 138 134*  --  137  K 4.4 3.7 5.5* 4.0 3.7  CL 99 99 96*  --  99  CO2 29 30 28   --  31  GLUCOSE 288* 220* 133*  --   154*  BUN 13 19 16   --  17  CREATININE 0.40* 0.43* 0.69  --  0.51*  CALCIUM 8.9 8.4* 8.6*  --  8.4*  MG 2.0 1.9  --   --  2.3  PHOS 2.7 4.0  --   --  3.2   Estimated Creatinine Clearance: 103.9 mL/min (A) (by C-G formula based on SCr of 0.51 mg/dL (L)). Liver & Pancreas: Recent Labs  Lab 03/28/21 0335 04/01/21 0353 04/02/21 0555  AST  --   --  46*  ALT  --   --  31  ALKPHOS  --   --  86  BILITOT  --   --  1.3*  PROT  --   --  6.7  ALBUMIN 2.6* 2.5* 2.7*   No results for input(s): LIPASE, AMYLASE in the last 168 hours. No results for input(s): AMMONIA in the last 168 hours. Diabetic: No results for input(s): HGBA1C in the last 72 hours. Recent Labs  Lab 04/02/21 0758 04/02/21 1118 04/02/21 1630 04/02/21 2051 04/03/21 0748  GLUCAP 167* 136* 160* 313* 188*   Cardiac Enzymes: No results for input(s): CKTOTAL, CKMB, CKMBINDEX, TROPONINI in the last 168 hours. No results for input(s): PROBNP in the last 8760 hours. Coagulation Profile: Recent Labs  Lab 04/03/21 0230  INR 1.2   Thyroid Function Tests: No results for input(s): TSH, T4TOTAL, FREET4, T3FREE, THYROIDAB in the last 72 hours. Lipid Profile: No results for input(s): CHOL, HDL, LDLCALC, TRIG, CHOLHDL, LDLDIRECT in the last 72 hours. Anemia Panel: No results for input(s): VITAMINB12, FOLATE, FERRITIN, TIBC, IRON, RETICCTPCT in the last 72 hours. Urine analysis:    Component Value Date/Time   COLORURINE YELLOW 01/18/2021 1500   APPEARANCEUR CLEAR 01/18/2021 1500   LABSPEC 1.006 01/18/2021 1500   PHURINE 6.0 01/18/2021 1500   GLUCOSEU NEGATIVE 01/18/2021 1500   HGBUR SMALL (A) 01/18/2021 1500   BILIRUBINUR NEGATIVE 01/18/2021 1500   KETONESUR  NEGATIVE 01/18/2021 1500   PROTEINUR NEGATIVE 01/18/2021 1500   NITRITE NEGATIVE 01/18/2021 1500   LEUKOCYTESUR NEGATIVE 01/18/2021 1500   Sepsis Labs: Invalid input(s): PROCALCITONIN, [place]  Microbiology: Recent Results (from the past 240 hour(s))   Culture, blood (routine x 2)     Status: None (Preliminary result)   Collection Time: 04/02/21  8:02 AM   Specimen: BLOOD  Result Value Ref Range Status   Specimen Description   Final    BLOOD BLOOD LEFT FOREARM Performed at [facility] [address], [place], [place] [address]    Special Requests   Final    BOTTLES DRAWN AEROBIC AND ANAEROBIC Blood Culture adequate volume Performed at [facility] [address], [place], [place] [address]    Culture   Final    NO GROWTH 1 DAY Performed at [facility], [place] [address], [place], [place] [address]    Report Status PENDING  Incomplete  Culture, blood (routine x 2)     Status: None (Preliminary result)   Collection Time: 04/02/21  8:02 AM   Specimen: BLOOD  Result Value Ref Range Status   Specimen Description   Final    BLOOD RIGHT ANTECUBITAL Performed at [facility] [address], [place], [place] [address]    Special Requests   Final    BOTTLES DRAWN AEROBIC AND ANAEROBIC Blood Culture adequate volume Performed at [facility] [address] [place], [place] [address]    Culture   Final    NO GROWTH 1 DAY Performed at [facility], [place] [address], [place], [place] [address]    Report Status PENDING  Incomplete  MRSA Next Gen by PCR, Nasal     Status: None   Collection Time: 04/02/21  8:04 AM   Specimen: Nasal Mucosa; Nasal Swab  Result Value Ref Range Status   MRSA by PCR Next Gen NOT DETECTED NOT DETECTED Final    Comment: (NOTE) The GeneXpert MRSA Assay (FDA approved for NASAL specimens only), is one component of a comprehensive MRSA colonization surveillance program. It is not intended to diagnose MRSA infection nor to guide or monitor treatment for MRSA infections. Test performance is not FDA approved in patients less than 21 years old. Performed at [facility], [place] [address], [place], [place]  [address]   Expectorated Sputum Assessment w Gram Stain, Rflx to Resp Cult     Status: None   Collection Time: 04/02/21 10:27 AM   Specimen: Sputum  Result Value Ref Range Status   Specimen Description SPUTUM  Final   Special Requests NONE  Final   Sputum evaluation   Final    THIS SPECIMEN IS ACCEPTABLE FOR SPUTUM CULTURE Performed at [facility], [place] [address] [place], [place] [address]    Report Status 04/03/2021 FINAL  Final     Radiology Studies: DG Chest Port 1 View  Result Date: 04/03/2021 CLINICAL DATA:  Hemoptysis. EXAM: PORTABLE CHEST 1 VIEW COMPARISON:  CT 04/02/2021.  Chest x-ray 04/02/2021. FINDINGS: Heart size stable. Large right apical fluid collection/mass again noted without interim change. Low lung volumes with persistent bibasilar atelectasis. Mild bibasilar infiltrates again noted. Stable right-sided pleural thickening. No pneumothorax. Surgical clips right chest. Mild thoracic spine scoliosis. IMPRESSION: 1. Large right apical fluid collection/mass again noted without interim change. 2. Low lung volumes with persistent bibasilar atelectasis. Mild bibasilar infiltrates again noted. Electronically Signed   By: Marcello Moores  Register  On: 04/03/2021 07:26      Isael Stille T. [place]  If 7PM-7AM, please contact night-coverage [email or web address] 04/03/2021, 12:06 PM

## 2021-04-04 ENCOUNTER — Inpatient Hospital Stay (HOSPITAL_COMMUNITY): Payer: 59

## 2021-04-04 ENCOUNTER — Ambulatory Visit
Admit: 2021-04-04 | Discharge: 2021-04-04 | Disposition: A | Discharge status: Home / Self Care | Payer: 59 | Attending: Radiation Oncology | Admitting: Radiation Oncology

## 2021-04-04 DIAGNOSIS — J9621 Acute and chronic respiratory failure with hypoxia: Secondary | ICD-10-CM | POA: Diagnosis not present

## 2021-04-04 DIAGNOSIS — R042 Hemoptysis: Secondary | ICD-10-CM

## 2021-04-04 DIAGNOSIS — R0489 Hemorrhage from other sites in respiratory passages: Secondary | ICD-10-CM | POA: Diagnosis not present

## 2021-04-04 DIAGNOSIS — I10 Essential (primary) hypertension: Secondary | ICD-10-CM | POA: Diagnosis not present

## 2021-04-04 DIAGNOSIS — J851 Abscess of lung with pneumonia: Secondary | ICD-10-CM | POA: Diagnosis not present

## 2021-04-04 LAB — CBC WITH DIFFERENTIAL/PLATELET
Abs Immature Granulocytes: 0.13 10*3/uL — ABNORMAL HIGH (ref 0.00–0.07)
Basophils Absolute: 0 10*3/uL (ref 0.0–0.1)
Basophils Relative: 0 %
Eosinophils Absolute: 0.2 10*3/uL (ref 0.0–0.5)
Eosinophils Relative: 2 %
HCT: 24.5 % — ABNORMAL LOW (ref 39.0–52.0)
Hemoglobin: 7.4 g/dL — ABNORMAL LOW (ref 13.0–17.0)
Immature Granulocytes: 2 %
Lymphocytes Relative: 5 %
Lymphs Abs: 0.5 10*3/uL — ABNORMAL LOW (ref 0.7–4.0)
MCH: 29.2 pg (ref 26.0–34.0)
MCHC: 30.2 g/dL (ref 30.0–36.0)
MCV: 96.8 fL (ref 80.0–100.0)
Monocytes Absolute: 0.7 10*3/uL (ref 0.1–1.0)
Monocytes Relative: 8 %
Neutro Abs: 7.3 10*3/uL (ref 1.7–7.7)
Neutrophils Relative %: 83 %
Platelets: 233 10*3/uL (ref 150–400)
RBC: 2.53 MIL/uL — ABNORMAL LOW (ref 4.22–5.81)
RDW: 15.5 % (ref 11.5–15.5)
WBC: 8.8 10*3/uL (ref 4.0–10.5)
nRBC: 0 % (ref 0.0–0.2)

## 2021-04-04 LAB — GLUCOSE, CAPILLARY
Glucose-Capillary: 163 mg/dL — ABNORMAL HIGH (ref 70–99)
Glucose-Capillary: 118 mg/dL — ABNORMAL HIGH (ref 70–99)
Glucose-Capillary: 114 mg/dL — ABNORMAL HIGH (ref 70–99)
Glucose-Capillary: 244 mg/dL — ABNORMAL HIGH (ref 70–99)
Glucose-Capillary: 211 mg/dL — ABNORMAL HIGH (ref 70–99)

## 2021-04-04 LAB — BODY FLUID CELL COUNT WITH DIFFERENTIAL
Eos, Fluid: 4 %
Lymphs, Fluid: 16 %
Monocyte-Macrophage-Serous Fluid: 3 % — ABNORMAL LOW (ref 50–90)
Neutrophil Count, Fluid: 77 % — ABNORMAL HIGH (ref 0–25)
Total Nucleated Cell Count, Fluid: UNDETERMINED cu mm (ref 0–1000)

## 2021-04-04 LAB — GLUCOSE, PLEURAL OR PERITONEAL FLUID: Glucose, Fluid: <20 mg/dL

## 2021-04-04 IMAGING — DX DG CHEST 1V PORT
1 series · 1 of 1 positions shown · non-contrast
Comparison: [DATE].

CLINICAL DATA: Chest tube.  Pleural effusion.

EXAM:
PORTABLE CHEST 1 VIEW

[chest ap]
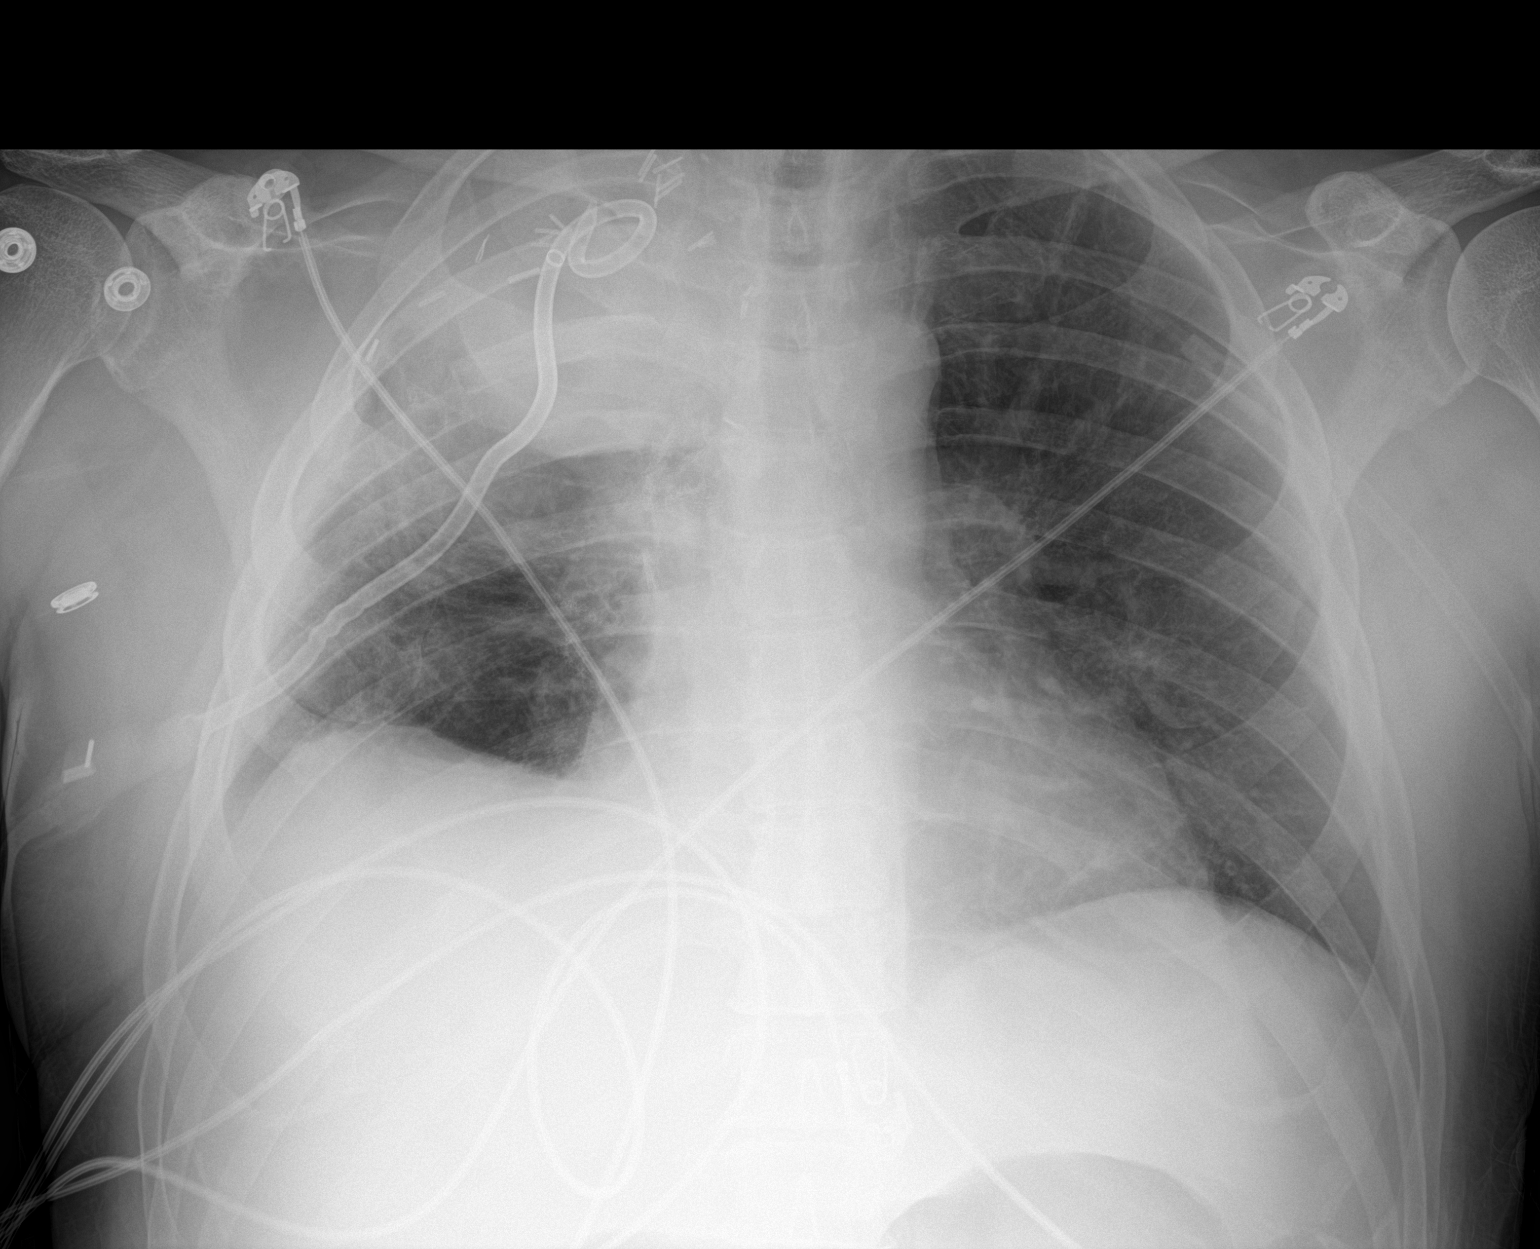

[1 of 1 positions shown; findings below may reference images not displayed]

FINDINGS: Right chest tube noted over the right upper chest. No pneumothorax.
Large right apical fluid collection/mass again noted without interim
change. Low lung volumes with mild bibasilar atelectasis. Stable
right-sided pleural thickening. No pleural effusion. Heart size
stable.
IMPRESSION: 1. Right chest tube noted over the right upper chest. No
pneumothorax.

2. Large right apical fluid collection/mass again noted without
interim change. Low lung volumes with mild bibasilar atelectasis.

## 2021-04-04 NOTE — Progress Notes (Signed)
Referring Physician(s): Hendrickson,S  Supervising Physician: Markus Daft  Patient Status:  [facility] - In-pt  Chief Complaint: Lung cancer, chest pain, dyspnea, right apical fluid collection   Subjective: Patient states he feels better since right chest drain placed yesterday; family in room; denies worsening dyspnea/cough   Allergies: Oxycodone hcl and Bupropion  Medications: Prior to Admission medications   Medication Sig Start Date End Date Taking? Authorizing Provider  acetaminophen (TYLENOL) 500 MG tablet Take 1,000 mg by mouth every 8 (eight) hours as needed for moderate pain or headache.   Yes [provider]  Ascorbic Acid (VITAMIN C) 1000 MG tablet Take 1,000 mg by mouth daily.   Yes [provider]  gabapentin (NEURONTIN) 300 MG capsule Take 300-600 mg by mouth See admin instructions. Takes 300 mg in the morning and afternoon and 600 mg at night 03/12/21  Yes [provider]  HYDROmorphone (DILAUDID) 2 MG tablet Take 1-2 tablets (2-4 mg total) by mouth every 4 (four) hours as needed for severe pain. Patient taking differently: Take 4 mg by mouth every 4 (four) hours as needed for severe pain. 03/13/21  Yes Conte, Tessa N, PA-C  irbesartan (AVAPRO) 300 MG tablet Take 300 mg by mouth daily.   Yes [provider]  latanoprost (XALATAN) 0.005 % ophthalmic solution Place 1 drop into both eyes at bedtime.   Yes [provider]  lidocaine (LIDODERM) 5 % Place 1 patch onto the skin daily. Remove & Discard patch within 12 hours or as directed by MD 03/07/21  Yes Regalado, Belkys A, MD  pantoprazole (PROTONIX) 40 MG tablet Take 1 tablet (40 mg total) by mouth daily. 03/08/21  Yes Regalado, Belkys A, MD  penicillin v potassium (VEETID) 500 MG tablet Take 500 mg by mouth 4 (four) times daily. Start date : 03/14/21 03/14/21  Yes [provider]  polyethylene glycol (MIRALAX / GLYCOLAX) 17 g packet Take 17 g by mouth daily as needed for mild  constipation. 03/07/21  Yes Regalado, Belkys A, MD  potassium chloride SA (KLOR-CON) 20 MEQ tablet Take 1 tablet (20 mEq total) by mouth daily. 03/07/21  Yes Regalado, Belkys A, MD  predniSONE (DELTASONE) 20 MG tablet Take 40 mg daily for 5 days, then 20 mg daily for 5 days then 10 mg daily for 5 days then stop. Patient taking differently: Take 20 mg by mouth daily with breakfast. Take 40 mg daily for 5 days, then 20 mg daily for 5 days then 10 mg daily for 5 days then stop. 03/07/21  Yes Regalado, Belkys A, MD  QUEtiapine (SEROQUEL) 50 MG tablet Take 100 mg by mouth at bedtime. 11/06/20  Yes [provider]  sertraline (ZOLOFT) 100 MG tablet Take 100 mg by mouth daily. 10/29/20  Yes [provider]  vitamin B-12 (CYANOCOBALAMIN) 1000 MCG tablet Take 1,000 mcg by mouth daily.   Yes [provider]  amLODipine (NORVASC) 10 MG tablet TAKE 1 TABLET BY MOUTH EVERY DAY 04/04/21   [name], MD  furosemide (LASIX) 20 MG tablet Take 1 tablet (20 mg total) by mouth daily. Patient not taking: No sig reported 03/07/21 03/18/21  Regalado, Jerald Kief A, MD     Vital Signs: BP (!) 151/71   Pulse (!) 112   Temp 98.3 F (36.8 C) (Oral)   Resp 16   Ht 5\' 10"  (1.778 m)   Wt 181 lb 10.5 oz (82.4 kg)   SpO2 97%   BMI 26.07 kg/m   Physical  Exam awake, alert.  Right anterior chest drain intact, insertion site mildly tender, output 570 cc blood-tinged fluid, Pleur-evac to wall suction, no obvious air leak.  Imaging: CT Angio Chest Pulmonary Embolism (PE) W or WO Contrast  Result Date: 04/02/2021 CLINICAL DATA:  Shortness of breath and cough. History of pulmonary adenocarcinoma with right upper lobectomy. Undergoing chemotherapy EXAM: CT ANGIOGRAPHY CHEST WITH CONTRAST TECHNIQUE: Multidetector CT imaging of the chest was performed using the standard protocol during bolus administration of intravenous contrast. Multiplanar CT image reconstructions and MIPs were obtained to evaluate  the vascular anatomy. CONTRAST:  163mL OMNIPAQUE IOHEXOL 350 MG/ML SOLN COMPARISON:  Chest CT 03/27/2021 FINDINGS: Cardiovascular: Suboptimal bolus density/timing exacerbated by streak artifact and motion. No evidence of central, lobar, or (when occasionally visible) segmental branch PE. No cardiomegaly or pericardial effusion. No acute aortic finding. Diffuse atheromatous calcification of the coronaries. Mediastinum/Nodes: No noted adenopathy or inflammation. Lungs/Pleura: Right apical collection with peripheral nodularity and bulging of adjacent mediastinal structures, 10.6 Cm in maximal transverse span. This collection contains new gas and there is progressive adjacent pulmonary opacification. There is also new infiltrate in the left lower lung with the same airspace pattern. The major central airways are clear. The dependent right pleural effusion is unchanged in size, small to moderate. Upper Abdomen: Negative Musculoskeletal: No acute finding no evidence of direct erosion or hematogenous bony metastasis. Review of the MIP images confirms the above findings. IMPRESSION: 1. New gas in the complex right apical collection, now likely communicating with the airways. Attendant increase in bilateral airspace disease which could be alveolar hemorrhage, endobronchial debris spread, or pneumonia. 2. Suboptimal pulmonary artery angiogram with no evidence of main or lobar pulmonary embolism. 3. Notable nodularity at the periphery of the right apical collection concerning for tumor recurrence. Electronically Signed   By: Monte Fantasia M.D.   On: 04/02/2021 11:09   DG Chest Port 1 View  Result Date: 04/04/2021 CLINICAL DATA:  Chest tube.  Pleural effusion. EXAM: PORTABLE CHEST 1 VIEW COMPARISON:  04/03/2021. FINDINGS: Right chest tube noted over the right upper chest. No pneumothorax. Large right apical fluid collection/mass again noted without interim change. Low lung volumes with mild bibasilar atelectasis. Stable  right-sided pleural thickening. No pleural effusion. Heart size stable. IMPRESSION: 1. Right chest tube noted over the right upper chest. No pneumothorax. 2. Large right apical fluid collection/mass again noted without interim change. Low lung volumes with mild bibasilar atelectasis. Electronically Signed   By: Marcello Moores  Register   On: 04/04/2021 08:26   DG Chest Port 1 View  Result Date: 04/03/2021 CLINICAL DATA:  Hemoptysis. EXAM: PORTABLE CHEST 1 VIEW COMPARISON:  CT 04/02/2021.  Chest x-ray 04/02/2021. FINDINGS: Heart size stable. Large right apical fluid collection/mass again noted without interim change. Low lung volumes with persistent bibasilar atelectasis. Mild bibasilar infiltrates again noted. Stable right-sided pleural thickening. No pneumothorax. Surgical clips right chest. Mild thoracic spine scoliosis. IMPRESSION: 1. Large right apical fluid collection/mass again noted without interim change. 2. Low lung volumes with persistent bibasilar atelectasis. Mild bibasilar infiltrates again noted. Electronically Signed   By: Marcello Moores  Register   On: 04/03/2021 07:26   DG Chest Port 1 View  Result Date: 04/02/2021 CLINICAL DATA:  Shortness of breath and tachycardia. EXAM: PORTABLE CHEST 1 VIEW COMPARISON:  03/27/2021 FINDINGS: Stable cardiomediastinal silhouette. Right upper lobe opacity is again noted corresponding to loculated pleural fluid and recurrent tumor as noted on recent chest CT from 03/27/2021. No pleural effusion or edema. New airspace opacity identified  within the left lower lobe. The visualized osseous structures are unremarkable. IMPRESSION: 1. New left lower lobe airspace opacity concerning for pneumonia. 2. Stable right upper lobe opacity corresponding to loculated pleural fluid and recurrent tumor. Electronically Signed   By: Kerby Moors M.D.   On: 04/02/2021 06:44   CT IMAGE GUIDED DRAINAGE BY PERCUTANEOUS CATHETER  Result Date: 04/03/2021 CLINICAL DATA:  Loculated effusion post  partial pneumonectomy EXAM: CT GUIDED CHEST DRAIN PLACEMENT ANESTHESIA/SEDATION: Intravenous Fentanyl 73mcg and Versed 0.5mg  were administered as conscious sedation during continuous monitoring of the patient's level of consciousness and physiological / cardiorespiratory status by the radiology RN, with a total moderate sedation time of 22 minutes. PROCEDURE: The procedure, risks, benefits, and alternatives were explained to the patient. Questions regarding the procedure were encouraged and answered. The patient understands and consents to the procedure. Select axial scans through the thorax were obtained. The loculated right apical collection was localized and an appropriate skin entry site was determined and marked. The operative field was prepped with chlorhexidinein a sterile fashion, and a sterile drape was applied covering the operative field. A sterile gown and sterile gloves were used for the procedure. Local anesthesia was provided with 1% Lidocaine. Under CT fluoroscopic guidance, 18 gauge percutaneous entry needle advanced to the collection. Amplatz wire advanced within the collection easily, position confirmed on CT. Tract dilated to facilitate placement of a 14 French pigtail drain catheter, position within the central dependent aspect of the collection. Bloody material returned. Catheter was secured externally 0 Prolene suture and StatLock and placed to Pleur-evac -20 cm H2O drainage. Site covered with sterile gauze and Vaseline gauze dressing. The patient tolerated the procedure well. COMPLICATIONS: None immediate FINDINGS: Loculated right apical collection was localized. 14 French pigtail drain catheter placed as chest drain. IMPRESSION: 1. Technically successful CT-guided right chest drain placement Electronically Signed   By: Lucrezia Europe M.D.   On: 04/03/2021 16:00    Labs:  CBC: Recent Labs    04/01/21 0353 04/02/21 0802 04/03/21 0230 04/04/21 0220  WBC 11.3* 14.0* 11.6* 8.8  HGB 8.6*  9.9* 8.4* 7.4*  HCT 28.1* 31.4* 27.4* 24.5*  PLT 339 296 232 233    COAGS: Recent Labs    01/18/21 1500 02/25/21 2204 03/18/21 0246 03/18/21 1609 03/19/21 0215 04/03/21 0230  INR 1.0   < > 1.0 1.0 1.1 1.2  APTT 28  --   --   --  30  --    < > = values in this interval not displayed.    BMP: Recent Labs    03/28/21 0335 04/01/21 0353 04/02/21 0555 04/02/21 0850 04/03/21 0230  NA 138 138 134*  --  137  K 4.4 3.7 5.5* 4.0 3.7  CL 99 99 96*  --  99  CO2 29 30 28   --  31  GLUCOSE 288* 220* 133*  --  154*  BUN 13 19 16   --  17  CALCIUM 8.9 8.4* 8.6*  --  8.4*  CREATININE 0.40* 0.43* 0.69  --  0.51*  GFRNONAA >60 >60 >60  --  >60    LIVER FUNCTION TESTS: Recent Labs    02/25/21 2204 02/27/21 0026 03/15/21 1254 03/27/21 0749 03/28/21 0335 04/01/21 0353 04/02/21 0555  BILITOT 1.1 1.0 0.8  --   --   --  1.3*  AST 21 21 12*  --   --   --  46*  ALT 28 19 36  --   --   --  31  ALKPHOS 103 75 94  --   --   --  86  PROT 7.1 5.9* 6.7  --   --   --  6.7  ALBUMIN 3.6 3.0* 3.1* 2.6* 2.6* 2.5* 2.7*    Assessment and Plan: Patient with history of stage IV lung cancer with prior right upper lobectomy in May 2022 and chest tube placement in June for loculated right apical effusion with eventual subsequent removal.  Now admitted with recurrent hemoptysis and fever and imaging revealing recurrent right apical complex fluid collection, status post right chest drain placement on 7/26; afebrile, output 570 cc blood-tinged fluid, WBC normal, hemoglobin 7.4 down from 8.4, drain fluid cultures pending, chest x-ray today:  1. Right chest tube noted over the right upper chest. No pneumothorax.   2. Large right apical fluid collection/mass again noted without interim change. Low lung volumes with mild bibasilar atelectasis.  Continue current treatment, further plans as per CCM/TCTS   Electronically Signed: D. Rowe Robert, PA-C 04/04/2021, 12:58 PM   I spent a total of 15 minutes  at the the patient's bedside AND on the patient's hospital floor or unit, greater than 50% of which was counseling/coordinating care for right chest drain    Patient ID: Christopher Carter, male   DOB: 08/17/1961, 60 y.o.   MRN: [id number]

## 2021-04-04 NOTE — Progress Notes (Signed)
16 Days Post-Op Procedure(s) (LRB): VIDEO BRONCHOSCOPY WITHOUT FLUORO (N/A) Subjective: Asleep currently. Talked to [name] with PT earlier and had a radiation treatment earlier as well  Objective: Vital signs in last 24 hours: Temp:  [97.5 F (36.4 C)-98.3 F (36.8 C)] 98.3 F (36.8 C) (07/27 0800) Pulse Rate:  [92-124] 109 (07/27 1400) Cardiac Rhythm: Sinus tachycardia (07/27 0700) Resp:  [7-23] 14 (07/27 1400) BP: (97-151)/(61-89) 119/75 (07/27 1500) SpO2:  [95 %-100 %] 100 % (07/27 1400)  Hemodynamic parameters for last 24 hours:    Intake/Output from previous day: 07/26 0701 - 07/27 0700 In: 381.3 [P.O.:240; IV Piggyback:141.3] Out: 2420 [Urine:1850; Chest Tube:570] Intake/Output this shift: Total I/O In: 326.7 [P.O.:300; IV Piggyback:26.7] Out: 290 [Chest Tube:290]  General appearance: sleeping 1000 ml in pleuravac  Lab Results: Recent Labs    04/03/21 0230 04/04/21 0220  WBC 11.6* 8.8  HGB 8.4* 7.4*  HCT 27.4* 24.5*  PLT 232 233   BMET:  Recent Labs    04/02/21 0555 04/02/21 0850 04/03/21 0230  NA 134*  --  137  K 5.5* 4.0 3.7  CL 96*  --  99  CO2 28  --  31  GLUCOSE 133*  --  154*  BUN 16  --  17  CREATININE 0.69  --  0.51*  CALCIUM 8.6*  --  8.4*    PT/INR:  Recent Labs    04/03/21 0230  LABPROT 14.8  INR 1.2   ABG    Component Value Date/Time   PHART 7.458 (H) 02/26/2021 0630   HCO3 28.7 (H) 02/26/2021 0630   TCO2 30 02/26/2021 0630   ACIDBASEDEF 1.3 01/18/2021 1504   O2SAT 96.0 02/26/2021 0630   CBG (last 3)  Recent Labs    04/04/21 0748 04/04/21 1229 04/04/21 1627  GLUCAP 163* 118* 114*    Assessment/Plan: S/P Procedure(s) (LRB): VIDEO BRONCHOSCOPY WITHOUT FLUORO (N/A) - CXR this AM showed only minimal decrease in pleural fluid loculated at apex. Has drained another 600 ml of fluid since I saw Christopher Carter yesterday, so will see what CXR shows in AM. Would definitely leave tube in until cultures come back. Has  deverfesced and WBC has returned to normal with antibiotics Will follow   LOS: 16 days    [name] 04/04/2021

## 2021-04-04 NOTE — Progress Notes (Signed)
PROGRESS NOTE    Christopher Carter  ZDG:[id number] DOB: [birth year] DOA: 03/18/2021 PCP: [name], MD    Brief Narrative:  60 year old M with PMH of stage IV adenocarcinoma s/p RUL lobectomy with node dissection on 5/16, recurrent hemoptysis, chronic RF on 5 L, tobacco abuse, HTN, depression, loculated hemithorax treated with IR chest tube placement, empiric antibiotics and prednisone taper and discharged to follow-up with CTS and radiation oncology.  He returned with cough with hemoptysis, right upper back pain, and increased oxygen requirement.  CT chest showed airspace disease in right lung representing alveolar hemorrhage with recurrence of apical fluid collection, nodular masslike lesion concerning for recurrence of malignancy and right peritracheal adenopathy.  He was admitted to [facility].  CTS, pulmonary IR and palliative consulted.  Started on IV cefepime, systemic steroid and tranexamic acid nebulization.  He underwent bronchoscopy that did not reveal active bleeding. Started on PCA Dilaudid by PMT for pain control, and transferred to [facility] for radiation oncology care.    Patient's hemoptysis resolved, oxygen requirement improved, and made good progression until 7/24 when he had small-volume hemoptysis on tissue papers.  Then, he spiked fever, tachycardia, tachypnea with increased oxygen requirement the early morning of 7/25.  CTA chest negative for large central PE but with new gas in complex right apical collection concerning for communication with the airways, adjacent increase in bilateral airspace disease.  Cultures obtained.  Started on broad-spectrum antibiotics with Vanco and Zosyn.  Transferred to stepdown unit.  CTS consulted and recommended drain placement by IR.    Assessment & Plan:   Principal Problem:   Intra-alveolar hemorrhage Active Problems:   Essential hypertension   Acute on chronic respiratory failure with hypoxia (HCC)   Malignant neoplasm of right upper  lobe of lung (HCC)   Major depressive disorder   Uncontrolled pain   Leukocytosis   Normocytic anemia   Tooth abscess   Thrombocytosis   Palliative care by specialist   Palliative care encounter   Abscess of upper lobe of right lung with pneumonia ([place])  Intra-alveolar hemorrhage in patient with history of recurrent hemoptysis-likely due to lung cancer. 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 s/p RUL lobectomy and node dissection by Dr. Koleen Nimrod on 5/16 Acute on chronic respiratory failure with hypoxia-on NRB.  CXR with new LLL PNA.  High risk for VTE. Severe sepsis: Febrile, tachycardic, tachypnea with acute on chronic hypoxic respiratory failure -7/10-CTA chest concerning for alveolar hemorrhage, loculated fluid at right lung apex lined by masslike nodularity consistent with recurrence of malignancy and right peritracheal LAD -7/11-bronchoscopy on 7/11-no active hemorrhage -7/12-respiratory culture with normal respiratory flora -7/13-Rad/onc-started radiation.  Planned for 6 and half week -7/18-completed 7 days of IV cefepime. -7/19-repeat CTA chest without significant change, may be improved hemorrhage -7/21-started on azithromycin.   -7/23-started on Augmentin for dental abscess -7/24-prednisone resumed at 20 mg daily due to recurrence of hemoptysis -7/25-fever, increased O2 requirement, tachycardia and tachypnea.  CXR with new LLL infiltrate.  CTA chest negative for large central PE but with new gas in complex right apical collection concerning for communication with the airways, adjacent increase in bilateral airspace disease.  CTS consulted and recommended IR drain Blood and sputum cultures obtained and started on vancomycin and Zosyn.   -7/26-significant improvement.  Blood culture and MRSA PCR negative thus Vancomycin discontinued.  Drain was placed by IR    Uncontrolled cancer-related pain: Rates his pain 6/10 today -Palliative medicine managing -On fentanyl patch 100 mcg,  p.o. morphine 7.5 mg PRN and IV fentanyl PRN -Celebrex and gabapentin. -On scheduled and as needed Valium for anxiety -Narcan as needed -Appears comfortable this AM    Dental pain/possible infection: Was on penicillin V outpatient.  Plan for root canal Tx on 7/11 but postponed. 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  Hyperglycemia partly due to steroid. -Continue SSI moderate -NovoLog 4 units 3 times daily with meals -Lantus 10 units twice daily -May adjust based on his evening CBG    History of anxiety/depression: -On Seroquel, Zoloft and Valium   GERD: -On  Protonix   DVT prophylaxis: SCD's Code Status: Full Family Communication: Pt in room, family at bedside  Status is: Inpatient  Remains inpatient appropriate because:Inpatient level of care appropriate due to severity of illness  Dispo: The patient is from: Home              Anticipated d/c is to: Home              Patient currently is not medically stable to d/c.   Difficult to place patient No   Consultants:  PCCM Cardiothoracic surgery IR Palliative medicine Oncology-signed off Radiation oncology Dental surgery-signed off  Procedures:  Drain placed by IR 7/26  Antimicrobials: Anti-infectives (From admission, onward)    Start     Dose/Rate Route Frequency Ordered Stop   04/02/21 2100  vancomycin (VANCOREADY) IVPB 1500 mg/300 mL  Status:  Discontinued        1,500 mg 150 mL/hr over 120 Minutes Intravenous Every 12 hours 04/02/21 0740 04/03/21 1115   04/02/21 0900  vancomycin  (VANCOREADY) IVPB 1500 mg/300 mL        1,500 mg 150 mL/hr over 120 Minutes Intravenous  Once 04/02/21 0724 04/02/21 1300   04/02/21 0800  piperacillin-tazobactam (ZOSYN) IVPB 3.375 g        3.375 g 12.5 mL/hr over 240 Minutes Intravenous Every 8 hours 04/02/21 0724     03/30/21 1130  amoxicillin-clavulanate (AUGMENTIN) 875-125 MG per tablet 1 tablet  Status:  Discontinued        1 tablet Oral Every 12 hours 03/30/21 1035 04/02/21 0716   03/29/21 1200  azithromycin (ZITHROMAX) tablet 250 mg  Status:  Discontinued       Note to Pharmacy: For dental infection   250 mg Oral Daily 03/29/21 1100 04/03/21 1115   03/19/21 0900  vancomycin (VANCOREADY) IVPB 1250 mg/250 mL  Status:  Discontinued        1,250 mg 166.7 mL/hr over 90 Minutes Intravenous Every 12 hours 03/18/21 1842 03/20/21 1059   03/18/21 2200  ceFEPIme (MAXIPIME) 2 g in sodium chloride 0.9 % 100 mL IVPB        2 g 200 mL/hr over 30 Minutes Intravenous Every 8 hours 03/18/21 1836 03/26/21 0835   03/18/21 1930  vancomycin (VANCOREADY) IVPB 1500 mg/300 mL        1,500 mg 150 mL/hr over 120 Minutes Intravenous  Once 03/18/21 1836 03/19/21 0734   03/18/21 1915  azithromycin (ZITHROMAX) 500 mg in sodium chloride 0.9 % 250 mL IVPB  Status:  Discontinued  500 mg 250 mL/hr over 60 Minutes Intravenous Every 24 hours 03/18/21 1822 03/19/21 0840   03/18/21 1800  penicillin v potassium (VEETID) tablet 500 mg  Status:  Discontinued       Note to Pharmacy: Start date : 03/14/21     500 mg Oral 4 times daily 03/18/21 1527 03/18/21 1842       Subjective: Reports feeling better.   Objective: Vitals:   04/04/21 0400 04/04/21 0415 04/04/21 0700 04/04/21 0800  BP: 111/64  134/69 (!) 151/71  Pulse: 97 98 (!) 102 (!) 112  Resp: (!) 7 (!) 8 12 16   Temp: (!) 97.5 F (36.4 C)   98.3 F (36.8 C)  TempSrc: Oral   Oral  SpO2: 100% 100% 99% 97%  Weight:      Height:        Intake/Output Summary (Last 24 hours) at 04/04/2021 1400 Last  data filed at 04/04/2021 1200 Gross per 24 hour  Intake 607.95 ml  Output 1840 ml  Net -1232.05 ml   Filed Weights   03/18/21 0242 03/25/21 1542 04/02/21 1330  Weight: 79.4 kg 81.7 kg 82.4 kg    Examination: General exam: Awake, laying in bed, in nad Respiratory system: Normal respiratory effort, no wheezing Cardiovascular system: regular rate, s1, s2 Gastrointestinal system: Soft, nondistended, positive BS Central nervous system: CN2-12 grossly intact, strength intact Extremities: Perfused, no clubbing Skin: Normal skin turgor, no notable skin lesions seen Psychiatry: Mood normal // no visual hallucinations   Data Reviewed: I have personally reviewed following labs and imaging studies  CBC: Recent Labs  Lab 03/30/21 0339 04/01/21 0353 04/02/21 0802 04/03/21 0230 04/04/21 0220  WBC 21.8* 11.3* 14.0* 11.6* 8.8  NEUTROABS  --   --  12.6*  --  7.3  HGB 9.2* 8.6* 9.9* 8.4* 7.4*  HCT 28.9* 28.1* 31.4* 27.4* 24.5*  MCV 97.0 97.9 94.0 97.2 96.8  PLT 427* 339 296 232 616   Basic Metabolic Panel: Recent Labs  Lab 04/01/21 0353 04/02/21 0555 04/02/21 0850 04/03/21 0230  NA 138 134*  --  137  K 3.7 5.5* 4.0 3.7  CL 99 96*  --  99  CO2 30 28  --  31  GLUCOSE 220* 133*  --  154*  BUN 19 16  --  17  CREATININE 0.43* 0.69  --  0.51*  CALCIUM 8.4* 8.6*  --  8.4*  MG 1.9  --   --  2.3  PHOS 4.0  --   --  3.2   GFR: Estimated Creatinine Clearance: 103.9 mL/min (A) (by C-G formula based on SCr of 0.51 mg/dL (L)). Liver Function Tests: Recent Labs  Lab 04/01/21 0353 04/02/21 0555  AST  --  46*  ALT  --  31  ALKPHOS  --  86  BILITOT  --  1.3*  PROT  --  6.7  ALBUMIN 2.5* 2.7*   No results for input(s): LIPASE, AMYLASE in the last 168 hours. No results for input(s): AMMONIA in the last 168 hours. Coagulation Profile: Recent Labs  Lab 04/03/21 0230  INR 1.2   Cardiac Enzymes: No results for input(s): CKTOTAL, CKMB, CKMBINDEX, TROPONINI in the last 168  hours. BNP (last 3 results) No results for input(s): PROBNP in the last 8760 hours. HbA1C: No results for input(s): HGBA1C in the last 72 hours. CBG: Recent Labs  Lab 04/03/21 0748 04/03/21 1712 04/03/21 2153 04/04/21 0748 04/04/21 1229  GLUCAP 188* 299* 176* 163* 118*   Lipid Profile: No results  for input(s): CHOL, HDL, LDLCALC, TRIG, CHOLHDL, LDLDIRECT in the last 72 hours. Thyroid Function Tests: No results for input(s): TSH, T4TOTAL, FREET4, T3FREE, THYROIDAB in the last 72 hours. Anemia Panel: No results for input(s): VITAMINB12, FOLATE, FERRITIN, TIBC, IRON, RETICCTPCT in the last 72 hours. Sepsis Labs: Recent Labs  Lab 04/02/21 0802 04/02/21 1102  LATICACIDVEN 1.8 1.4    Recent Results (from the past 240 hour(s))  Culture, blood (routine x 2)     Status: None (Preliminary result)   Collection Time: 04/02/21  8:02 AM   Specimen: BLOOD  Result Value Ref Range Status   Specimen Description   Final    BLOOD BLOOD LEFT FOREARM Performed at [facility] [address] [place], [place] [address]    Special Requests   Final    BOTTLES DRAWN AEROBIC AND ANAEROBIC Blood Culture adequate volume Performed at [facility] [address] [place], [place] [address]    Culture   Final    NO GROWTH 2 DAYS Performed at [facility] [address] [place], [place] [address]    Report Status PENDING  Incomplete  Culture, blood (routine x 2)     Status: None (Preliminary result)   Collection Time: 04/02/21  8:02 AM   Specimen: BLOOD  Result Value Ref Range Status   Specimen Description   Final    BLOOD RIGHT ANTECUBITAL Performed at [facility] [address], [place], [place] [address]    Special Requests   Final    BOTTLES DRAWN AEROBIC AND ANAEROBIC Blood Culture adequate volume Performed at [facility] [address] [place], [place] [address]    Culture   Final    NO GROWTH  2 DAYS Performed at [facility] [address], [place], [place] [address]    Report Status PENDING  Incomplete  MRSA Next Gen by PCR, Nasal     Status: None   Collection Time: 04/02/21  8:04 AM   Specimen: Nasal Mucosa; Nasal Swab  Result Value Ref Range Status   MRSA by PCR Next Gen NOT DETECTED NOT DETECTED Final    Comment: (NOTE) The GeneXpert MRSA Assay (FDA approved for NASAL specimens only), is one component of a comprehensive MRSA colonization surveillance program. It is not intended to diagnose MRSA infection nor to guide or monitor treatment for MRSA infections. Test performance is not FDA approved in patients less than 37 years old. Performed at [facility], [place] [address], [place], [place] [address]   Expectorated Sputum Assessment w Gram Stain, Rflx to Resp Cult     Status: None   Collection Time: 04/02/21 10:27 AM   Specimen: Sputum  Result Value Ref Range Status   Specimen Description SPUTUM  Final   Special Requests NONE  Final   Sputum evaluation   Final    THIS SPECIMEN IS ACCEPTABLE FOR SPUTUM CULTURE Performed at [facility], [place] [address] [place], [place] [address]    Report Status 04/03/2021 FINAL  Final  Culture, Respiratory w Gram Stain     Status: None (Preliminary result)   Collection Time: 04/02/21 10:27 AM   Specimen: SPU  Result Value Ref Range Status   Specimen Description   Final    SPUTUM Performed at [facility] [address], [place], [place] [address]    Special Requests   Final    NONE Reflexed from [id number] Performed at [facility]  [place] [address] [place], [place] [address]    Gram Stain   Final    RARE WBC PRESENT,BOTH PMN AND MONONUCLEAR RARE GRAM POSITIVE COCCI IN CLUSTERS RARE GRAM NEGATIVE COCCOBACILLI    Culture   Final    CULTURE REINCUBATED FOR BETTER GROWTH Performed at [facility], [place] [address] [place], [place] [address]     Report Status PENDING  Incomplete     Radiology Studies: DG Chest Port 1 View  Result Date: 04/04/2021 CLINICAL DATA:  Chest tube.  Pleural effusion. EXAM: PORTABLE CHEST 1 VIEW COMPARISON:  04/03/2021. FINDINGS: Right chest tube noted over the right upper chest. No pneumothorax. Large right apical fluid collection/mass again noted without interim change. Low lung volumes with mild bibasilar atelectasis. Stable right-sided pleural thickening. No pleural effusion. Heart size stable. IMPRESSION: 1. Right chest tube noted over the right upper chest. No pneumothorax. 2. Large right apical fluid collection/mass again noted without interim change. Low lung volumes with mild bibasilar atelectasis. Electronically Signed   By: Marcello Moores  Register   On: 04/04/2021 08:26   DG Chest Port 1 View  Result Date: 04/03/2021 CLINICAL DATA:  Hemoptysis. EXAM: PORTABLE CHEST 1 VIEW COMPARISON:  CT 04/02/2021.  Chest x-ray 04/02/2021. FINDINGS: Heart size stable. Large right apical fluid collection/mass again noted without interim change. Low lung volumes with persistent bibasilar atelectasis. Mild bibasilar infiltrates again noted. Stable right-sided pleural thickening. No pneumothorax. Surgical clips right chest. Mild thoracic spine scoliosis. IMPRESSION: 1. Large right apical fluid collection/mass again noted without interim change. 2. Low lung volumes with persistent bibasilar atelectasis. Mild bibasilar infiltrates again noted. Electronically Signed   By: Marcello Moores  Register   On: 04/03/2021 07:26   CT IMAGE GUIDED DRAINAGE BY PERCUTANEOUS CATHETER  Result Date: 04/03/2021 CLINICAL DATA:  Loculated effusion post partial pneumonectomy EXAM: CT GUIDED CHEST DRAIN PLACEMENT ANESTHESIA/SEDATION: Intravenous Fentanyl 53mcg and Versed 0.5mg  were administered as conscious sedation during continuous monitoring of the patient's level of consciousness and physiological / cardiorespiratory status by the radiology RN, with a total  moderate sedation time of 22 minutes. PROCEDURE: The procedure, risks, benefits, and alternatives were explained to the patient. Questions regarding the procedure were encouraged and answered. The patient understands and consents to the procedure. Select axial scans through the thorax were obtained. The loculated right apical collection was localized and an appropriate skin entry site was determined and marked. The operative field was prepped with chlorhexidinein a sterile fashion, and a sterile drape was applied covering the operative field. A sterile gown and sterile gloves were used for the procedure. Local anesthesia was provided with 1% Lidocaine. Under CT fluoroscopic guidance, 18 gauge percutaneous entry needle advanced to the collection. Amplatz wire advanced within the collection easily, position confirmed on CT. Tract dilated to facilitate placement of a 14 French pigtail drain catheter, position within the central dependent aspect of the collection. Bloody material returned. Catheter was secured externally 0 Prolene suture and StatLock and placed to Pleur-evac -20 cm H2O drainage. Site covered with sterile gauze and Vaseline gauze dressing. The patient tolerated the procedure well. COMPLICATIONS: None immediate FINDINGS: Loculated right apical collection was localized. 14 French pigtail drain catheter placed as chest drain. IMPRESSION: 1. Technically successful CT-guided right chest drain placement Electronically Signed   By: Lucrezia Europe M.D.   On: 04/03/2021 16:00    Scheduled Meds:  (feeding supplement) PROSource Plus  30 mL Oral BID BM   benzonatate  200 mg Oral BID  celecoxib  200 mg Oral BID   Chlorhexidine Gluconate Cloth  6 each Topical Daily   diazepam  1 mg Oral Q8H   feeding supplement  237 mL Oral BID BM   fentaNYL  1 patch Transdermal Q72H   ferrous sulfate  325 mg Oral Q breakfast   furosemide  40 mg Intravenous Once   gabapentin  300 mg Oral BID WC   gabapentin  600 mg Oral QHS    guaiFENesin  600 mg Oral BID   insulin aspart  0-15 Units Subcutaneous TID WC   insulin aspart  0-5 Units Subcutaneous QHS   insulin aspart  4 Units Subcutaneous TID WC   insulin glargine  10 Units Subcutaneous BID   irbesartan  75 mg Oral Daily   latanoprost  1 drop Both Eyes QHS   levalbuterol  0.63 mg Nebulization Q6H   lidocaine  1 patch Transdermal Q24H   multivitamin with minerals  1 tablet Oral Daily   pantoprazole  40 mg Oral Daily   predniSONE  20 mg Oral Q breakfast   QUEtiapine  100 mg Oral QHS   sertraline  100 mg Oral Daily   sodium chloride flush  3 mL Intravenous Q12H   Continuous Infusions:  piperacillin-tazobactam (ZOSYN)  IV 12.5 mL/hr at 04/04/21 1200   tranexamic acid       LOS: 16 days   Marylu Lund, MD [facility] Pager On Amion  If 7PM-7AM, please contact night-coverage 04/04/2021, 2:00 PM

## 2021-04-04 NOTE — Progress Notes (Signed)
NAME:  Christopher Carter, MRN:  [id number], DOB:  Aug 13, 1961, LOS: 60 ADMISSION DATE:  03/18/2021, CONSULTATION DATE:  03/18/2021 REFERRING MD:  Dr. Tamala Julian, CHIEF COMPLAINT:  Hemoptysis    History of Present Illness:  Christopher Carter is a 60 y.o. male with a PMH significant for Stage IV adenocarcinoma now S/P right upper lobectomy with node dissection 01/22/2021, on 5L [place] at baseline, tobacco abuse, HTN, and depression who presented to [facility] for recurrent hemoptysis. Patient has been treated at this facility for similar presentation and underwent video bronchoscopy and IR placed chest tube for pleural effusion with Dr. Roxan Hockey. He was discharged in stable condition with plans to follow up with cardiothoracic surgery and oncology.  He presented this admission with recurrent hemoptysis that started 4hr prior to admission with associated right upper back pain that radiates to upper neck. Patient was transferred from [place] to [facility] for cardiothoracic, pulmonary, and IR consults. Vital signs stable, WBC elevated at 18.1. CT chest was obtained and consistent with alveolar hemorrhage localized to the right with right apical fluid collection.   Pertinent  Medical History  Stage IIIa adenocarcinoma now S/P right upper lobectomy with node dissection 01/22/2021, on 5L [place] at baseline, tobacco abuse, HTN, and depression   Significant Hospital Events:  7/10 admitted with recurrent hemoptysis  7/11 bronch- nonfocal source, had bloody frothy secretions in all airways, petechiae on bronchi Receiving radiation therapy Pain is better controlled 7/25 worsening hypoxia and new temp to 104 7/26 chest tube by IR planned 7/27 chest tube successfully placed, down to 3 L nasal cannula  Interim History / Subjective:  States feeling well after chest tube placement, approximately 500 cc bloody sanguinous fluid out down to 3 L nasal cannula and able to ambulate the hallway.  Respiratory  culture growing gram-negative coccobacilli, continues on vancomycin and Zosyn  Objective   Blood pressure 111/64, pulse 98, temperature (!) 97.5 F (36.4 C), temperature source Oral, resp. rate (!) 8, height 5\' 10"  (1.778 m), weight 82.4 kg, SpO2 100 %.        Intake/Output Summary (Last 24 hours) at 04/04/2021 0726 Last data filed at 04/04/2021 0600 Gross per 24 hour  Intake 381.3 ml  Output 2420 ml  Net -2038.7 ml    Filed Weights   03/18/21 0242 03/25/21 1542 04/02/21 1330  Weight: 79.4 kg 81.7 kg 82.4 kg    General: Well-nourished male, sitting up in bed, awake and conversational no distress HEENT: MM pink/moist Neuro: Awake and alert, oriented x4, moving all extremities without focal neurodeficits CV: s1s2 RRR, no m/r/g PULM: Right anterior chest tube in place, minimal pain surrounding insertion site, 500 cc in [place] chamber, on 20 L suction.  Lungs clear bilaterally with decreased air movement right lower lobe, on 3 L nasal cannula without any distress GI: soft, bsx4 active  Extremities: warm/dry, no edema  Skin: no rashes or lesions  Resolved Hospital Problem list     Assessment & Plan:   Acute on chronic hypoxemic respiratory failure - presumed 2/2 PNA with probable lung abscess RUL. -S/p RUL chest tube placement by IR on 7/26 -Sputum culture from 7/25 initially growing gram-positive cocci and gram negative coccobacilli speciation pending, continue vancomycin and Zosyn -send pleural fluid for culture -Cardiothoracic surgery is following -Continue working towards getting out of bed and ambulating   Hemoptysis  Improving -Continue prednisone 20 mg daily and monitor -Hemoglobin 7.4 today, trend and transfuse for hemoglobin<7   Stage IV adenocarcinoma of  the lung s/p right upper lobectomy. -Supportive care and outpatient oncology follow-up   Pain control - 2/2 above. - Continue pain control per palliative medicine, fentanyl adjusted and patient feels that pain  is currently controlled  DM  anticipate slight worsening of CBG's with steroids. -Continue sliding scale insulin and Lantus  Dental pain / possible infection  had been treated with PenV as outpatient with planned root canal 7/11; however, this was postponed. - Dental surgery recommending abx and outpatient follow up.   Best Practice   Diet/type: Regular consistency (see orders) DVT prophylaxis: SCD GI prophylaxis: N/A Lines: N/A Foley:  N/A Code Status:  full code Last date of multidisciplinary goals of care discussion: Per primary    Otilio Carpen Alissah Redmon, PA-C [facility] See Amion for pager If no response to pager , please call [phone] until 7pm After 7:00 pm call Elink  [phone]

## 2021-04-04 NOTE — Progress Notes (Signed)
Physical Therapy Treatment Patient Details Name: Christopher Carter MRN: [id number] DOB: 1960/10/13 Today's Date: 04/04/2021    History of Present Illness 60 y.o. male who presented to [facility] for recurrent hemoptysis. Dx of intra alveolar hemorrhage, acute on chronic respiratory failure.  CT in place. 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 Pt was able to ambulate without device on unit with supervision with assist mostly for lines. Does complain of right hip pain with activity.  States this was premorbid.  Sats dropped briefly to 87% but returned to >90% with pursed lip breathing.  Pt most likely close to baseline but will continue to follow for endurance training.  Pt currently with functional limitations due to balance and endurance deficits. Pt will benefit from skilled PT to increase their independence and safety with mobility to allow discharge to the venue listed below.      Follow Up Recommendations  Outpatient PT;No PT follow up     Equipment Recommendations  None recommended by PT    Recommendations for Other Services       Precautions / Restrictions Precautions Precautions: Fall Precaution Comments: Chest tube, O2 Restrictions Weight Bearing Restrictions: No    Mobility  Bed Mobility Overal bed mobility: Needs Assistance Bed Mobility: Supine to Sit;Sit to Supine     Supine to sit: Modified independent (Device/Increase time) Sit to supine: Modified independent (Device/Increase time)   General bed mobility comments: supervision for safety    Transfers Overall transfer level: Needs assistance Equipment used: None Transfers: Sit to/from Stand Sit to Stand: Supervision         General transfer comment: decr reliance in UEs, supervision for lines and  safety  Ambulation/Gait Ambulation/Gait assistance: Supervision;Min guard Gait Distance (Feet): 320 Feet Assistive device: IV Pole;None Gait Pattern/deviations: Step-through pattern;Decreased stride length;Wide base of support Gait velocity: decr Gait velocity interpretation: <1.8 ft/sec, indicate of risk for recurrent falls General Gait Details: supervision for safety with use of IV pole. no LOB however 2 standing rest d/t fatigue and  R hip low back pain. pt performs repeated standing stretches. single UE support on IV pole for stability. HR max 137, SpO2=87-95% on 3L with pursed lip breathing techniques encouraged and sats not sustained below 88% for most of the walk.   Stairs             Wheelchair Mobility    Modified Rankin (Stroke Patients Only)       Balance Overall balance assessment: Needs assistance Sitting-balance support: Feet supported Sitting balance-Leahy Scale: Good     Standing balance support: Single extremity supported;No upper extremity supported;During functional activity Standing balance-Leahy Scale: Fair Standing balance comment: able to maintain static standing, use of UE support on IV pole for dynamic tasks                            Cognition Arousal/Alertness: Awake/alert Behavior During Therapy: WFL for tasks assessed/performed Overall Cognitive Status: Within Functional Limits for tasks assessed                                        Exercises      General Comments  Pertinent Vitals/Pain Pain Assessment: Faces Faces Pain Scale: Hurts little more Pain Location: right hip/glut/low back Pain Descriptors / Indicators: Discomfort;Grimacing Pain Intervention(s): Limited activity within patient's tolerance;Monitored during session;Repositioned;Patient requesting pain meds-RN notified    Home Living                      Prior Function            PT Goals (current goals can now be found in  the care plan section) Acute Rehab PT Goals Patient Stated Goal: Get moving more and have less pain in R arm Progress towards PT goals: Progressing toward goals    Frequency    Min 3X/week      PT Plan Current plan remains appropriate    Co-evaluation              AM-PAC PT "6 Clicks" Mobility   Outcome Measure  Help needed turning from your back to your side while in a flat bed without using bedrails?: None Help needed moving from lying on your back to sitting on the side of a flat bed without using bedrails?: None Help needed moving to and from a bed to a chair (including a wheelchair)?: A Little Help needed standing up from a chair using your arms (e.g., wheelchair or bedside chair)?: A Little Help needed to walk in hospital room?: A Little Help needed climbing 3-5 steps with a railing? : A Little 6 Click Score: 20    End of Session Equipment Utilized During Treatment: Gait belt;Oxygen Activity Tolerance: Patient tolerated treatment well Patient left: in bed;with call bell/phone within reach;with bed alarm set Nurse Communication: Mobility status PT Visit Diagnosis: Unsteadiness on feet (R26.81);Muscle weakness (generalized) (M62.81) Pain - Right/Left: Right Pain - part of body: Arm (rhomboid/lower trap region)     Time: 8938-1017 PT Time Calculation (min) (ACUTE ONLY): 24 min  Charges:  $Gait Training: 23-37 mins                     Christopher Carter M,PT Acute Rehab Services [phone] (pager)    Christopher Carter 04/04/2021, 11:26 AM

## 2021-04-04 NOTE — Progress Notes (Signed)
Patient walked 2 full laps around ICU and tolerated activity well. HR in the 120's, increased O2 from 3L [facility] to 5L [facility] with ambulation for desat to 84%, unlabored respirations. Patient took two brief breaks to do some ROM in his right hip that is causing him some pain. Patient returned to room and sat up in chair for about 20 minutes. Linens on bed changed. Fentanyl given after returning to bed. Patient expressed he would like to walk again after breakfast and was in good spirits this morning. CT to water seal during walk and dumped 60cc's, returned to -20 suction.

## 2021-04-04 NOTE — Progress Notes (Signed)
Paged IR Dr. Anselm Pancoast and received call back about needing order to flush patient's tube since many clots were noted. Dr. Anselm Pancoast aware and advised no need to flush, also asked if ok to undo piggy tail in order to draw fluid off for sample. Dr. Anselm Pancoast verbalized no problems with this. Will continue to monitor

## 2021-04-05 ENCOUNTER — Inpatient Hospital Stay (HOSPITAL_COMMUNITY): Payer: 59

## 2021-04-05 ENCOUNTER — Other Ambulatory Visit: Payer: Self-pay | Admitting: Radiation Oncology

## 2021-04-05 ENCOUNTER — Ambulatory Visit
Admit: 2021-04-05 | Discharge: 2021-04-05 | Disposition: A | Discharge status: Home / Self Care | Payer: 59 | Attending: Radiation Oncology | Admitting: Radiation Oncology

## 2021-04-05 DIAGNOSIS — R0489 Hemorrhage from other sites in respiratory passages: Secondary | ICD-10-CM | POA: Diagnosis not present

## 2021-04-05 DIAGNOSIS — J9621 Acute and chronic respiratory failure with hypoxia: Secondary | ICD-10-CM | POA: Diagnosis not present

## 2021-04-05 DIAGNOSIS — I10 Essential (primary) hypertension: Secondary | ICD-10-CM | POA: Diagnosis not present

## 2021-04-05 DIAGNOSIS — R042 Hemoptysis: Secondary | ICD-10-CM

## 2021-04-05 LAB — COMPREHENSIVE METABOLIC PANEL
ALT: 19 U/L (ref 0–44)
AST: 11 U/L — ABNORMAL LOW (ref 15–41)
Albumin: 2.6 g/dL — ABNORMAL LOW (ref 3.5–5.0)
Alkaline Phosphatase: 64 U/L (ref 38–126)
Anion gap: 6 (ref 5–15)
BUN: 13 mg/dL (ref 6–20)
CO2: 31 mmol/L (ref 22–32)
Calcium: 8.9 mg/dL (ref 8.9–10.3)
Chloride: 101 mmol/L (ref 98–111)
Creatinine, Ser: 0.6 mg/dL — ABNORMAL LOW (ref 0.61–1.24)
GFR, Estimated: >60 mL/min (ref >60)
Glucose, Bld: 137 mg/dL — ABNORMAL HIGH (ref 70–99)
Potassium: 4.4 mmol/L (ref 3.5–5.1)
Sodium: 138 mmol/L (ref 135–145)
Total Bilirubin: 0.4 mg/dL (ref 0.3–1.2)
Total Protein: 5.7 g/dL — ABNORMAL LOW (ref 6.5–8.1)

## 2021-04-05 LAB — CBC
HCT: 25.2 % — ABNORMAL LOW (ref 39.0–52.0)
Hemoglobin: 7.6 g/dL — ABNORMAL LOW (ref 13.0–17.0)
MCH: 29.6 pg (ref 26.0–34.0)
MCHC: 30.2 g/dL (ref 30.0–36.0)
MCV: 98.1 fL (ref 80.0–100.0)
Platelets: 261 10*3/uL (ref 150–400)
RBC: 2.57 MIL/uL — ABNORMAL LOW (ref 4.22–5.81)
RDW: 15.5 % (ref 11.5–15.5)
WBC: 10.6 10*3/uL — ABNORMAL HIGH (ref 4.0–10.5)
nRBC: 0 % (ref 0.0–0.2)

## 2021-04-05 LAB — GLUCOSE, CAPILLARY
Glucose-Capillary: 103 mg/dL — ABNORMAL HIGH (ref 70–99)
Glucose-Capillary: 239 mg/dL — ABNORMAL HIGH (ref 70–99)
Glucose-Capillary: 259 mg/dL — ABNORMAL HIGH (ref 70–99)
Glucose-Capillary: 179 mg/dL — ABNORMAL HIGH (ref 70–99)

## 2021-04-05 LAB — CULTURE, RESPIRATORY W GRAM STAIN: Culture: NORMAL

## 2021-04-05 LAB — PATHOLOGIST SMEAR REVIEW

## 2021-04-05 IMAGING — DX DG CHEST 1V PORT
1 series · 1 of 1 positions shown · non-contrast
Comparison: Chest x-ray [DATE].  CT [DATE].

CLINICAL DATA: Right chest tube.

EXAM:
PORTABLE CHEST 1 VIEW

[chest ap]
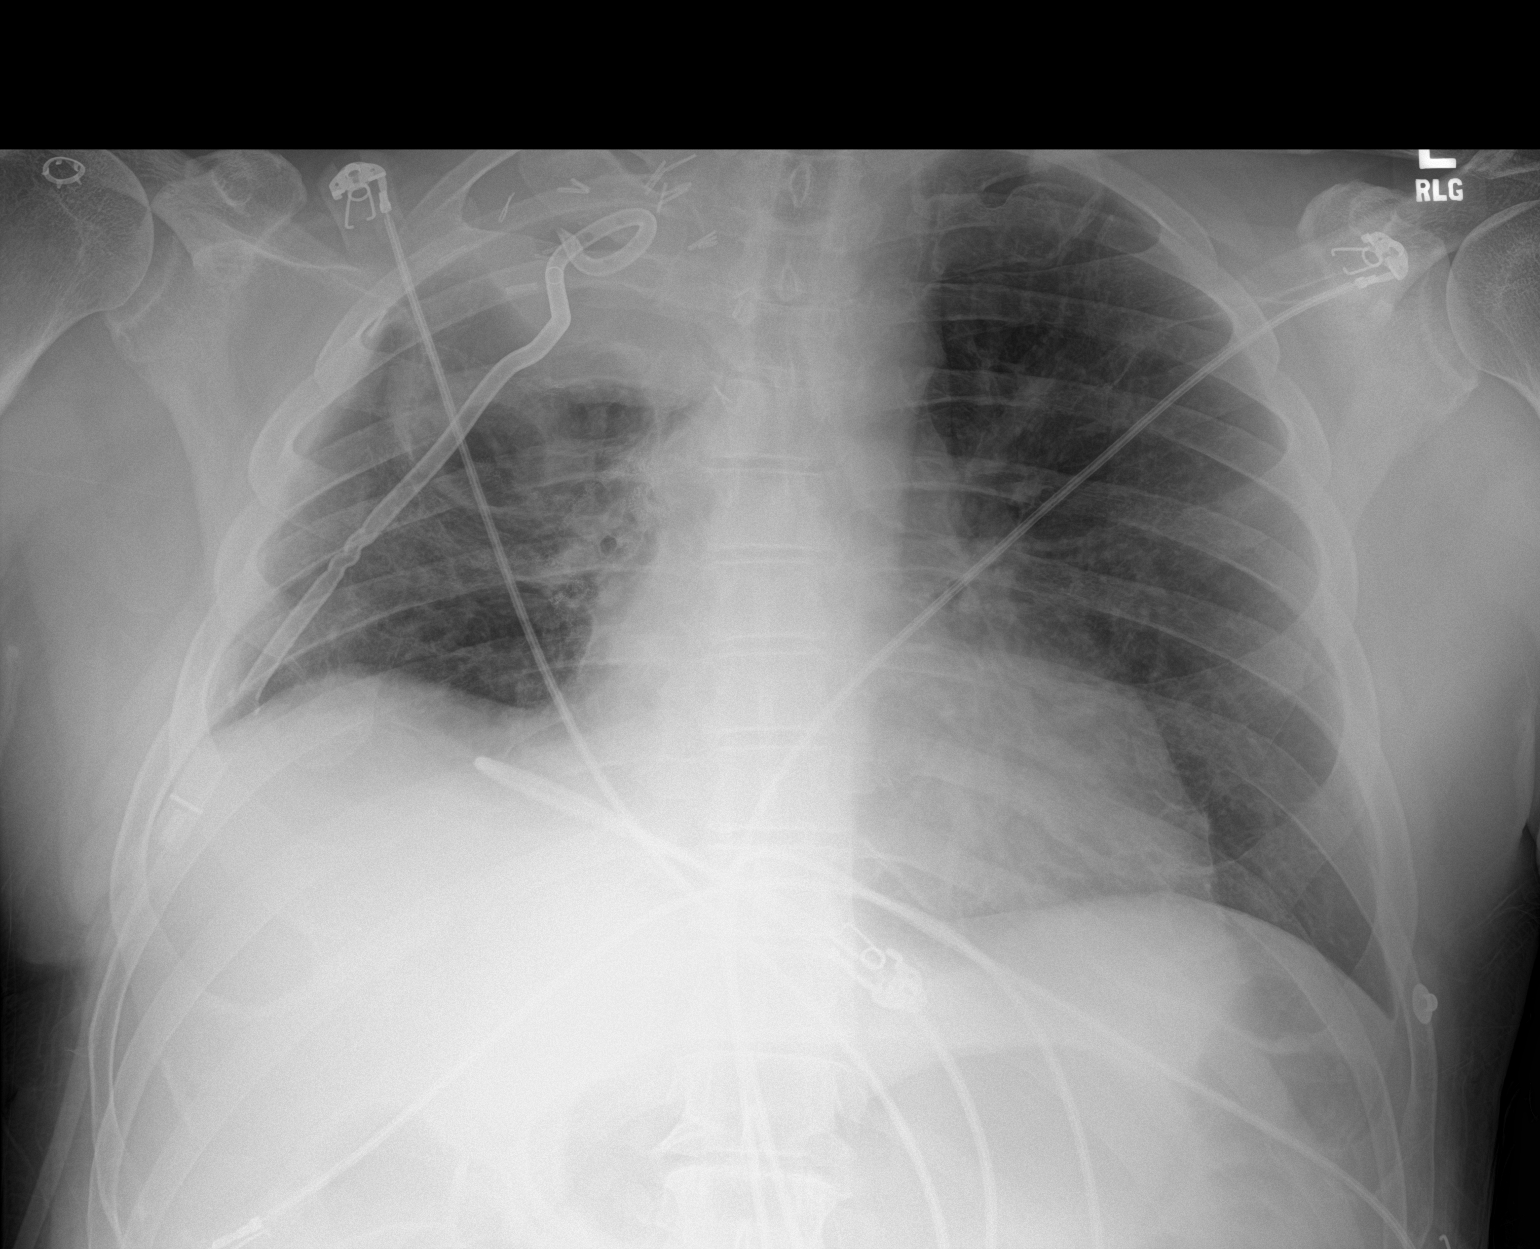

[1 of 1 positions shown; findings below may reference images not displayed]

FINDINGS: Surgical clips right upper chest. Right chest tube in stable
position. No pneumothorax. Right apical cavitary fluid
collection/mass again noted without interim change. No pleural
effusion. Stable cardiomegaly. Mild thoracic spine scoliosis.
IMPRESSION: 1.  Right chest tube in stable position.  No pneumothorax.

2. Large right apical cavitary fluid collection/mass again noted
without interim change.

## 2021-04-05 NOTE — Progress Notes (Signed)
PROGRESS NOTE    Christopher Carter  AJG:[id number] DOB: 07-20-1961 DOA: 03/18/2021 PCP: [name], MD    Brief Narrative:  60 year old M with PMH of stage IV adenocarcinoma s/p RUL lobectomy with node dissection on 5/16, recurrent hemoptysis, chronic RF on 5 L, tobacco abuse, HTN, depression, loculated hemithorax treated with IR chest tube placement, empiric antibiotics and prednisone taper and discharged to follow-up with CTS and radiation oncology.  He returned with cough with hemoptysis, right upper back pain, and increased oxygen requirement.  CT chest showed airspace disease in right lung representing alveolar hemorrhage with recurrence of apical fluid collection, nodular masslike lesion concerning for recurrence of malignancy and right peritracheal adenopathy.  He was admitted to [facility].  CTS, pulmonary IR and palliative consulted.  Started on IV cefepime, systemic steroid and tranexamic acid nebulization.  He underwent bronchoscopy that did not reveal active bleeding. Started on PCA Dilaudid by PMT for pain control, and transferred to [facility] for radiation oncology care.    Patient's hemoptysis resolved, oxygen requirement improved, and made good progression until 7/24 when he had small-volume hemoptysis on tissue papers.  Then, he spiked fever, tachycardia, tachypnea with increased oxygen requirement the early morning of 7/25.  CTA chest negative for large central PE but with new gas in complex right apical collection concerning for communication with the airways, adjacent increase in bilateral airspace disease.  Cultures obtained.  Started on broad-spectrum antibiotics with Vanco and Zosyn.  Transferred to stepdown unit.  CTS consulted and recommended drain placement by IR.    Assessment & Plan:   Principal Problem:   Intra-alveolar hemorrhage Active Problems:   Essential hypertension   Acute on chronic respiratory failure with hypoxia (HCC)   Malignant neoplasm of right upper  lobe of lung (HCC)   Major depressive disorder   Uncontrolled pain   Leukocytosis   Normocytic anemia   Tooth abscess   Thrombocytosis   Palliative care by specialist   Palliative care encounter   Abscess of upper lobe of right lung with pneumonia ([place])  Intra-alveolar hemorrhage in patient with history of recurrent hemoptysis-likely due to lung cancer. 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 s/p RUL lobectomy and node dissection by Dr. Koleen Nimrod on 5/16 Acute on chronic respiratory failure with hypoxia-on NRB.  CXR with new LLL PNA.  High risk for VTE. Severe sepsis: Febrile, tachycardic, tachypnea with acute on chronic hypoxic respiratory failure -7/10-CTA chest concerning for alveolar hemorrhage, loculated fluid at right lung apex lined by masslike nodularity consistent with recurrence of malignancy and right peritracheal LAD -7/11-bronchoscopy on 7/11-no active hemorrhage -7/12-respiratory culture with normal respiratory flora -7/13-Rad/onc-started radiation.  Planned for 6 and half week -7/18-completed 7 days of IV cefepime. -7/19-repeat CTA chest without significant change, may be improved hemorrhage -7/21-started on azithromycin.   -7/23-started on Augmentin for dental abscess -7/24-prednisone resumed at 20 mg daily due to recurrence of hemoptysis -7/25-fever, increased O2 requirement, tachycardia and tachypnea.  CXR with new LLL infiltrate.  CTA chest negative for large central PE but with new gas in complex right apical collection concerning for communication with the airways, adjacent increase in bilateral airspace disease.  CTS consulted and recommended IR drain Blood and sputum cultures obtained and started on vancomycin and Zosyn.   -7/26-significant improvement.  Blood culture and MRSA PCR negative thus Vancomycin discontinued.  Drain was placed by IR -Per CTS, cont drainage with continued abx. Pt appears better    Uncontrolled cancer-related pain: Rates his pain  6/10  today -Palliative medicine managing -On fentanyl patch 100 mcg, p.o. morphine 7.5 mg PRN and IV fentanyl PRN -Celebrex and gabapentin. -On scheduled and as needed Valium for anxiety -Narcan as needed -comfortably sitting in chair this AM    Dental pain/possible infection: Was on penicillin V outpatient.  Plan for root canal Tx on 7/11 but postponed. -In-house dental surgery consulted and recommended antibiotics and outpatient follow-up with his orthodontist -Pt is continued on zosyn   Anemia of chronic disease due to cancer: -Hgb currently 7.4, from 8.4 yesterday -Hemodynamically stable -Repeat cbc in AM   Essential hypertension:  -BP stable at this time -Amlodipine.was recently d/c secondary to soft BP -Currently on decreased dose of Avapro to 75 mg daily   Controlled DM-2 with hyperglycemia: A1c 6.5%.  Hyperglycemia partly due to steroid. -Continue SSI moderate -NovoLog 4 units 3 times daily with meals -Lantus 10 units twice daily -May adjust based on his evening CBG    History of anxiety/depression: -On Seroquel, Zoloft and Valium   GERD: -Continue on Protonix   DVT prophylaxis: SCD's Code Status: Full Family Communication: Pt in room, family at bedside  Status is: Inpatient  Remains inpatient appropriate because:Inpatient level of care appropriate due to severity of illness  Dispo: The patient is from: Home              Anticipated d/c is to: Home              Patient currently is not medically stable to d/c.   Difficult to place patient No   Consultants:  PCCM Cardiothoracic surgery IR Palliative medicine Oncology-signed off Radiation oncology Dental surgery-signed off  Procedures:  Drain placed by IR 7/26  Antimicrobials: Anti-infectives (From admission, onward)    Start     Dose/Rate Route Frequency Ordered Stop   04/02/21 2100  vancomycin (VANCOREADY) IVPB 1500 mg/300 mL  Status:  Discontinued        1,500 mg 150 mL/hr over 120 Minutes  Intravenous Every 12 hours 04/02/21 0740 04/03/21 1115   04/02/21 0900  vancomycin (VANCOREADY) IVPB 1500 mg/300 mL        1,500 mg 150 mL/hr over 120 Minutes Intravenous  Once 04/02/21 0724 04/02/21 1300   04/02/21 0800  piperacillin-tazobactam (ZOSYN) IVPB 3.375 g        3.375 g 12.5 mL/hr over 240 Minutes Intravenous Every 8 hours 04/02/21 0724     03/30/21 1130  amoxicillin-clavulanate (AUGMENTIN) 875-125 MG per tablet 1 tablet  Status:  Discontinued        1 tablet Oral Every 12 hours 03/30/21 1035 04/02/21 0716   03/29/21 1200  azithromycin (ZITHROMAX) tablet 250 mg  Status:  Discontinued       Note to Pharmacy: For dental infection   250 mg Oral Daily 03/29/21 1100 04/03/21 1115   03/19/21 0900  vancomycin (VANCOREADY) IVPB 1250 mg/250 mL  Status:  Discontinued        1,250 mg 166.7 mL/hr over 90 Minutes Intravenous Every 12 hours 03/18/21 1842 03/20/21 1059   03/18/21 2200  ceFEPIme (MAXIPIME) 2 g in sodium chloride 0.9 % 100 mL IVPB        2 g 200 mL/hr over 30 Minutes Intravenous Every 8 hours 03/18/21 1836 03/26/21 0835   03/18/21 1930  vancomycin (VANCOREADY) IVPB 1500 mg/300 mL        1,500 mg 150 mL/hr over 120 Minutes Intravenous  Once 03/18/21 1836 03/19/21 0734   03/18/21 1915  azithromycin (ZITHROMAX) 500  mg in sodium chloride 0.9 % 250 mL IVPB  Status:  Discontinued        500 mg 250 mL/hr over 60 Minutes Intravenous Every 24 hours 03/18/21 1822 03/19/21 0840   03/18/21 1800  penicillin v potassium (VEETID) tablet 500 mg  Status:  Discontinued       Note to Pharmacy: Start date : 03/14/21     500 mg Oral 4 times daily 03/18/21 1527 03/18/21 1842       Subjective: Reports feeling better.   Objective: Vitals:   04/05/21 1000 04/05/21 1200 04/05/21 1300 04/05/21 1353  BP: (!) 154/75 129/61    Pulse: (!) 118 (!) 106 95   Resp: 18 10 (!) 8   Temp:   97.9 F (36.6 C)   TempSrc:   Oral   SpO2: 94% 99% 100% 98%  Weight:      Height:        Intake/Output  Summary (Last 24 hours) at 04/05/2021 1528 Last data filed at 04/05/2021 1300 Gross per 24 hour  Intake 252.04 ml  Output 1460 ml  Net -1207.96 ml    Filed Weights   03/18/21 0242 03/25/21 1542 04/02/21 1330  Weight: 79.4 kg 81.7 kg 82.4 kg    Examination: General exam: Conversant, in no acute distress Respiratory system: normal chest rise, clear, no audible wheezing Cardiovascular system: regular rhythm, s1-s2 Gastrointestinal system: Nondistended, nontender, pos BS Central nervous system: No seizures, no tremors Extremities: No cyanosis, no joint deformities Skin: No rashes, no pallor Psychiatry: Affect normal // no auditory hallucinations   Data Reviewed: I have personally reviewed following labs and imaging studies  CBC: Recent Labs  Lab 04/01/21 0353 04/02/21 0802 04/03/21 0230 04/04/21 0220 04/05/21 0241  WBC 11.3* 14.0* 11.6* 8.8 10.6*  NEUTROABS  --  12.6*  --  7.3  --   HGB 8.6* 9.9* 8.4* 7.4* 7.6*  HCT 28.1* 31.4* 27.4* 24.5* 25.2*  MCV 97.9 94.0 97.2 96.8 98.1  PLT 339 296 232 233 253    Basic Metabolic Panel: Recent Labs  Lab 04/01/21 0353 04/02/21 0555 04/02/21 0850 04/03/21 0230 04/05/21 0241  NA 138 134*  --  137 138  K 3.7 5.5* 4.0 3.7 4.4  CL 99 96*  --  99 101  CO2 30 28  --  31 31  GLUCOSE 220* 133*  --  154* 137*  BUN 19 16  --  17 13  CREATININE 0.43* 0.69  --  0.51* 0.60*  CALCIUM 8.4* 8.6*  --  8.4* 8.9  MG 1.9  --   --  2.3  --   PHOS 4.0  --   --  3.2  --     GFR: Estimated Creatinine Clearance: 103.9 mL/min (A) (by C-G formula based on SCr of 0.6 mg/dL (L)). Liver Function Tests: Recent Labs  Lab 04/01/21 0353 04/02/21 0555 04/05/21 0241  AST  --  46* 11*  ALT  --  31 19  ALKPHOS  --  86 64  BILITOT  --  1.3* 0.4  PROT  --  6.7 5.7*  ALBUMIN 2.5* 2.7* 2.6*    No results for input(s): LIPASE, AMYLASE in the last 168 hours. No results for input(s): AMMONIA in the last 168 hours. Coagulation Profile: Recent Labs   Lab 04/03/21 0230  INR 1.2    Cardiac Enzymes: No results for input(s): CKTOTAL, CKMB, CKMBINDEX, TROPONINI in the last 168 hours. BNP (last 3 results) No results for input(s): PROBNP in the last  8760 hours. HbA1C: No results for input(s): HGBA1C in the last 72 hours. CBG: Recent Labs  Lab 04/04/21 1627 04/04/21 2021 04/04/21 2218 04/05/21 0757 04/05/21 1313  GLUCAP 114* 244* 211* 103* 239*    Lipid Profile: No results for input(s): CHOL, HDL, LDLCALC, TRIG, CHOLHDL, LDLDIRECT in the last 72 hours. Thyroid Function Tests: No results for input(s): TSH, T4TOTAL, FREET4, T3FREE, THYROIDAB in the last 72 hours. Anemia Panel: No results for input(s): VITAMINB12, FOLATE, FERRITIN, TIBC, IRON, RETICCTPCT in the last 72 hours. Sepsis Labs: Recent Labs  Lab 04/02/21 0802 04/02/21 1102  LATICACIDVEN 1.8 1.4     Recent Results (from the past 240 hour(s))  Culture, blood (routine x 2)     Status: None (Preliminary result)   Collection Time: 04/02/21  8:02 AM   Specimen: BLOOD  Result Value Ref Range Status   Specimen Description   Final    BLOOD BLOOD LEFT FOREARM Performed at [facility] [address] [place], [place] [address]    Special Requests   Final    BOTTLES DRAWN AEROBIC AND ANAEROBIC Blood Culture adequate volume Performed at [facility] [address], [place], [place] [address]    Culture   Final    NO GROWTH 3 DAYS Performed at [facility], [place] [address], [place], [place] [address]    Report Status PENDING  Incomplete  Culture, blood (routine x 2)     Status: None (Preliminary result)   Collection Time: 04/02/21  8:02 AM   Specimen: BLOOD  Result Value Ref Range Status   Specimen Description   Final    BLOOD RIGHT ANTECUBITAL Performed at [facility] [address], [place], [place] [address]    Special Requests   Final    BOTTLES DRAWN AEROBIC AND ANAEROBIC Blood Culture  adequate volume Performed at [facility] [address], [place], [place] [address]    Culture   Final    NO GROWTH 3 DAYS Performed at [facility], [place] [address], [place], [place] [address]    Report Status PENDING  Incomplete  MRSA Next Gen by PCR, Nasal     Status: None   Collection Time: 04/02/21  8:04 AM   Specimen: Nasal Mucosa; Nasal Swab  Result Value Ref Range Status   MRSA by PCR Next Gen NOT DETECTED NOT DETECTED Final    Comment: (NOTE) The GeneXpert MRSA Assay (FDA approved for NASAL specimens only), is one component of a comprehensive MRSA colonization surveillance program. It is not intended to diagnose MRSA infection nor to guide or monitor treatment for MRSA infections. Test performance is not FDA approved in patients less than 72 years old. Performed at [facility], [place] [address], [place], [place] [address]   Expectorated Sputum Assessment w Gram Stain, Rflx to Resp Cult     Status: None   Collection Time: 04/02/21 10:27 AM   Specimen: Sputum  Result Value Ref Range Status   Specimen Description SPUTUM  Final   Special Requests NONE  Final   Sputum evaluation   Final    THIS SPECIMEN IS ACCEPTABLE FOR SPUTUM CULTURE Performed at [facility], [place] [address] [place],  [address]    Report Status 04/03/2021 FINAL  Final  Culture, Respiratory w Gram Stain     Status: None   Collection Time: 04/02/21 10:27 AM   Specimen: SPU  Result Value Ref Range Status  Specimen Description   Final    SPUTUM Performed at [facility] [address], [place], [place] [address]    Special Requests   Final    NONE Reflexed from [phone] Performed at [facility] [address] [place], [place] [address]    Gram Stain   Final    RARE WBC PRESENT,BOTH PMN AND MONONUCLEAR RARE GRAM POSITIVE COCCI IN CLUSTERS RARE GRAM NEGATIVE COCCOBACILLI    Culture   Final     MODERATE Normal respiratory flora-no Staph aureus or Pseudomonas seen Performed at [facility], [address], [place], [place] [address]    Report Status 04/05/2021 FINAL  Final  Body fluid culture w Gram Stain     Status: None (Preliminary result)   Collection Time: 04/04/21  4:20 PM   Specimen: Pleural Fluid  Result Value Ref Range Status   Specimen Description   Final    PLEURAL Performed at [facility] [address], [place], [place] [address]    Special Requests   Final    NONE Performed at [facility], [place] [address], [place], [place] [address]    Gram Stain   Final    RARE WBC PRESENT, PREDOMINANTLY MONONUCLEAR NO ORGANISMS SEEN Performed at [facility], [place] [address], [place], [place] [address]    Culture PENDING  Incomplete   Report Status PENDING  Incomplete      Radiology Studies: DG Chest Port 1 View  Result Date: 04/05/2021 CLINICAL DATA:  Right chest tube. EXAM: PORTABLE CHEST 1 VIEW COMPARISON:  Chest x-ray 04/04/2021.  CT 04/02/2021. FINDINGS: Surgical clips right upper chest. Right chest tube in stable position. No pneumothorax. Right apical cavitary fluid collection/mass again noted without interim change. No pleural effusion. Stable cardiomegaly. Mild thoracic spine scoliosis. IMPRESSION: 1.  Right chest tube in stable position.  No pneumothorax. 2. Large right apical cavitary fluid collection/mass again noted without interim change. Electronically Signed   By: Marcello Moores  Register   On: 04/05/2021 06:14   DG Chest Port 1 View  Result Date: 04/04/2021 CLINICAL DATA:  Chest tube.  Pleural effusion. EXAM: PORTABLE CHEST 1 VIEW COMPARISON:  04/03/2021. FINDINGS: Right chest tube noted over the right upper chest. No pneumothorax. Large right apical fluid collection/mass again noted without interim change. Low lung volumes with mild bibasilar atelectasis. Stable right-sided pleural thickening. No pleural  effusion. Heart size stable. IMPRESSION: 1. Right chest tube noted over the right upper chest. No pneumothorax. 2. Large right apical fluid collection/mass again noted without interim change. Low lung volumes with mild bibasilar atelectasis. Electronically Signed   By: Marcello Moores  Register   On: 04/04/2021 08:26    Scheduled Meds:  (feeding supplement) PROSource Plus  30 mL Oral BID BM   benzonatate  200 mg Oral BID   celecoxib  200 mg Oral BID   Chlorhexidine Gluconate Cloth  6 each Topical Daily   diazepam  1 mg Oral Q8H   feeding supplement  237 mL Oral BID BM   fentaNYL  1 patch Transdermal Q72H   ferrous sulfate  325 mg Oral Q breakfast   furosemide  40 mg Intravenous Once   gabapentin  300 mg Oral BID WC   gabapentin  600 mg Oral QHS   guaiFENesin  600 mg Oral BID   insulin aspart  0-15 Units Subcutaneous TID WC   insulin aspart  0-5 Units Subcutaneous QHS   insulin aspart  4  Units Subcutaneous TID WC   insulin glargine  10 Units Subcutaneous BID   irbesartan  75 mg Oral Daily   latanoprost  1 drop Both Eyes QHS   levalbuterol  0.63 mg Nebulization Q6H   lidocaine  1 patch Transdermal Q24H   multivitamin with minerals  1 tablet Oral Daily   pantoprazole  40 mg Oral Daily   predniSONE  20 mg Oral Q breakfast   QUEtiapine  100 mg Oral QHS   sertraline  100 mg Oral Daily   sodium chloride flush  3 mL Intravenous Q12H   Continuous Infusions:  piperacillin-tazobactam (ZOSYN)  IV Stopped (04/05/21 1241)   tranexamic acid       LOS: 17 days   Marylu Lund, MD [facility] Pager On Amion  If 7PM-7AM, please contact night-coverage 04/05/2021, 3:28 PM

## 2021-04-05 NOTE — Progress Notes (Signed)
[facility] [employer] [facility]) Hospital Liaison note:   This is a pending outpatient-based Palliative Care patient. Will continue to follow for disposition.  Please call with any outpatient palliative questions or concerns.  Thank you, Lorelee Market, LPN [facility] Liaison [phone]

## 2021-04-05 NOTE — Progress Notes (Signed)
17 Days Post-Op Procedure(s) (LRB): VIDEO BRONCHOSCOPY WITHOUT FLUORO (N/A) Subjective: Has had a good day today Some pain in shoulder this evening  Objective: Vital signs in last 24 hours: Temp:  [97.7 F (36.5 C)-98 F (36.7 C)] 97.9 F (36.6 C) (07/28 1300) Pulse Rate:  [93-118] 102 (07/28 1700) Cardiac Rhythm: Normal sinus rhythm (07/28 1200) Resp:  [8-22] 15 (07/28 1700) BP: (93-154)/(53-87) 124/67 (07/28 1625) SpO2:  [91 %-100 %] 96 % (07/28 1700)  Hemodynamic parameters for last 24 hours:    Intake/Output from previous day: 07/27 0701 - 07/28 0700 In: 439 [P.O.:300; IV Piggyback:139] Out: 1080 [Urine:500; Chest Tube:580] Intake/Output this shift: Total I/O In: 43.5 [IV Piggyback:43.5] Out: 720 [Urine:600; Chest Tube:120]  General appearance: alert, cooperative, and no distress Blood tinged drainage from tube  Lab Results: Recent Labs    04/04/21 0220 04/05/21 0241  WBC 8.8 10.6*  HGB 7.4* 7.6*  HCT 24.5* 25.2*  PLT 233 261   BMET:  Recent Labs    04/03/21 0230 04/05/21 0241  NA 137 138  K 3.7 4.4  CL 99 101  CO2 31 31  GLUCOSE 154* 137*  BUN 17 13  CREATININE 0.51* 0.60*  CALCIUM 8.4* 8.9    PT/INR:  Recent Labs    04/03/21 0230  LABPROT 14.8  INR 1.2   ABG    Component Value Date/Time   PHART 7.458 (H) 02/26/2021 0630   HCO3 28.7 (H) 02/26/2021 0630   TCO2 30 02/26/2021 0630   ACIDBASEDEF 1.3 01/18/2021 1504   O2SAT 96.0 02/26/2021 0630   CBG (last 3)  Recent Labs    04/05/21 0757 04/05/21 1313 04/05/21 1645  GLUCAP 103* 239* 259*    Assessment/Plan: S/P Procedure(s) (LRB): VIDEO BRONCHOSCOPY WITHOUT FLUORO (N/A) - Continues to progress Remains afebrile CXR shows ight apical collection is smaller Drainage is not purulent- GS negative, culture still pending Drained about 600 ml yesterday and another 120 ml so far today Keep drain in place   LOS: 17 days    Christopher Carter 04/05/2021

## 2021-04-05 NOTE — Progress Notes (Signed)
Pharmacy Antibiotic Note  CECILIA NISHIKAWA is a 60 y.o. male admitted on 03/18/2021 with Lung Ca, RUL lobectomy, abx included Vanc/Cefepime, Augmentin, Azithromycin - current Augmentin/po Azithromycin.  Pharmacy has been consulted for Vancomycin/Zosyn dosing with Temp 104.6, WBC decreasing.  04/05/21 9:55 AM  Vancomycin stopped with negative MRSA PCR Completed azithromycin  WBC 10.6 Afebrile SCr 0.6  Plan: Continue Zosyn 3.375g IV q8h (4 hour infusion) F/U renal function F/U pleural fluid culture and final blood culture results F/U plans for antibiotics    Height: 5\' 10"  (177.8 cm) Weight: 82.4 kg (181 lb 10.5 oz) IBW/kg (Calculated) : 73  Temp (24hrs), Avg:97.8 F (36.6 C), Min:97.6 F (36.4 C), Max:98 F (36.7 C)  Recent Labs  Lab 04/01/21 0353 04/02/21 0555 04/02/21 0802 04/02/21 1102 04/03/21 0230 04/04/21 0220 04/05/21 0241  WBC 11.3*  --  14.0*  --  11.6* 8.8 10.6*  CREATININE 0.43* 0.69  --   --  0.51*  --  0.60*  LATICACIDVEN  --   --  1.8 1.4  --   --   --      Estimated Creatinine Clearance: 103.9 mL/min (A) (by C-G formula based on SCr of 0.6 mg/dL (L)).    Allergies  Allergen Reactions   Oxycodone Hcl Other (See Comments)    Pt reported "seeing things" and " feeling unusual."   Bupropion Nausea And Vomiting     Augmentin 7/22 >> 7/25 Cefepime 7/10 >> 7/18 Vanc 7/10 >>7/12, resumed 7/25 >> 7/26 Zosyn 7/25 >> Azithro 7/10 >>7/11, resumed po 7/21 >> 7/26 PTA PCN QID 7/6>>7/10  7/12 Sputum Cx:nl flora 7/12 MRSA PCR: neg 7/25 BCx: ngtd 7/25 SCx: normal flora 7/27 pleural fluid: ngtd  Thank you for allowing pharmacy to be a part of this patient's care.  Ulice Dash D PharmD 04/05/2021 9:54 AM

## 2021-04-05 NOTE — TOC Progression Note (Signed)
Transition of Care [facility]) - Progression Note    Patient Details  Name: Christopher Carter MRN: [id number] Date of Birth: 07-16-1961  Transition of Care [facility] CM/SW Contact  Leeroy Cha, RN Phone Number: 04/05/2021, 7:31 AM  Clinical Narrative:    Patient's hemoptysis resolved, oxygen requirement improved, and made good progression until 7/24 when he had small-volume hemoptysis on tissue papers.  Then, he spiked fever, tachycardia, tachypnea with increased oxygen requirement the early morning of 7/25.  CTA chest negative for large central PE but with new gas in complex right apical collection concerning for communication with the airways, adjacent increase in bilateral airspace disease.  Cultures obtained.  Started on broad-spectrum antibiotics with Vanco and Zosyn.  Transferred to stepdown unit.  CTS consulted and recommended drain placement by IR.    Assessment & Plan:   Principal Problem:   Intra-alveolar hemorrhage Active Problems:   Essential hypertension   Acute on chronic respiratory failure with hypoxia (HCC)   Malignant neoplasm of right upper lobe of lung (HCC)   Major depressive disorder   Uncontrolled pain   Leukocytosis   Normocytic anemia   Tooth abscess   Thrombocytosis   Palliative care by specialist   Palliative care encounter   Abscess of upper lobe of right lung with pneumonia ([place])  TOC PLAN OF CARE: Home with p[ossible hhc and o2 if needed. Iv zosyn and tranexamic acid for clots noted in the oplueral fluid, resp culture showed GPC in clusters and coccobacilli.  Wbc-10.6, hgb 7.6 plat wnl range at 261. Following for progression.        Expected Discharge Plan and Services                                                 Social Determinants of Health (SDOH) Interventions    Readmission Risk Interventions No flowsheet data found.

## 2021-04-06 ENCOUNTER — Ambulatory Visit
Admit: 2021-04-06 | Discharge: 2021-04-06 | Disposition: A | Discharge status: Home / Self Care | Payer: 59 | Attending: Radiation Oncology | Admitting: Radiation Oncology

## 2021-04-06 ENCOUNTER — Inpatient Hospital Stay (HOSPITAL_COMMUNITY): Payer: 59

## 2021-04-06 DIAGNOSIS — R0489 Hemorrhage from other sites in respiratory passages: Secondary | ICD-10-CM | POA: Diagnosis not present

## 2021-04-06 DIAGNOSIS — J9621 Acute and chronic respiratory failure with hypoxia: Secondary | ICD-10-CM | POA: Diagnosis not present

## 2021-04-06 DIAGNOSIS — I10 Essential (primary) hypertension: Secondary | ICD-10-CM | POA: Diagnosis not present

## 2021-04-06 LAB — COMPREHENSIVE METABOLIC PANEL
ALT: 18 U/L (ref 0–44)
AST: 10 U/L — ABNORMAL LOW (ref 15–41)
Albumin: 2.4 g/dL — ABNORMAL LOW (ref 3.5–5.0)
Alkaline Phosphatase: 61 U/L (ref 38–126)
Anion gap: 6 (ref 5–15)
BUN: 11 mg/dL (ref 6–20)
CO2: 31 mmol/L (ref 22–32)
Calcium: 8.5 mg/dL — ABNORMAL LOW (ref 8.9–10.3)
Chloride: 102 mmol/L (ref 98–111)
Creatinine, Ser: 0.58 mg/dL — ABNORMAL LOW (ref 0.61–1.24)
GFR, Estimated: >60 mL/min (ref >60)
Glucose, Bld: 126 mg/dL — ABNORMAL HIGH (ref 70–99)
Potassium: 3.8 mmol/L (ref 3.5–5.1)
Sodium: 139 mmol/L (ref 135–145)
Total Bilirubin: 0.5 mg/dL (ref 0.3–1.2)
Total Protein: 5.3 g/dL — ABNORMAL LOW (ref 6.5–8.1)

## 2021-04-06 LAB — CBC
HCT: 22 % — ABNORMAL LOW (ref 39.0–52.0)
Hemoglobin: 6.9 g/dL — CL (ref 13.0–17.0)
MCH: 30.3 pg (ref 26.0–34.0)
MCHC: 31.4 g/dL (ref 30.0–36.0)
MCV: 96.5 fL (ref 80.0–100.0)
Platelets: 246 10*3/uL (ref 150–400)
RBC: 2.28 MIL/uL — ABNORMAL LOW (ref 4.22–5.81)
RDW: 15.3 % (ref 11.5–15.5)
WBC: 9.6 10*3/uL (ref 4.0–10.5)
nRBC: 0 % (ref 0.0–0.2)

## 2021-04-06 LAB — HEMOGLOBIN AND HEMATOCRIT, BLOOD
HCT: 28.8 % — ABNORMAL LOW (ref 39.0–52.0)
Hemoglobin: 8.9 g/dL — ABNORMAL LOW (ref 13.0–17.0)

## 2021-04-06 LAB — GLUCOSE, CAPILLARY
Glucose-Capillary: 185 mg/dL — ABNORMAL HIGH (ref 70–99)
Glucose-Capillary: 101 mg/dL — ABNORMAL HIGH (ref 70–99)
Glucose-Capillary: 230 mg/dL — ABNORMAL HIGH (ref 70–99)
Glucose-Capillary: 183 mg/dL — ABNORMAL HIGH (ref 70–99)

## 2021-04-06 LAB — PREPARE RBC (CROSSMATCH)

## 2021-04-06 IMAGING — DX DG CHEST 1V PORT
1 series · 1 of 1 positions shown · non-contrast
Comparison: [DATE]

CLINICAL DATA: Cough

EXAM:
PORTABLE CHEST 1 VIEW

[chest ap]
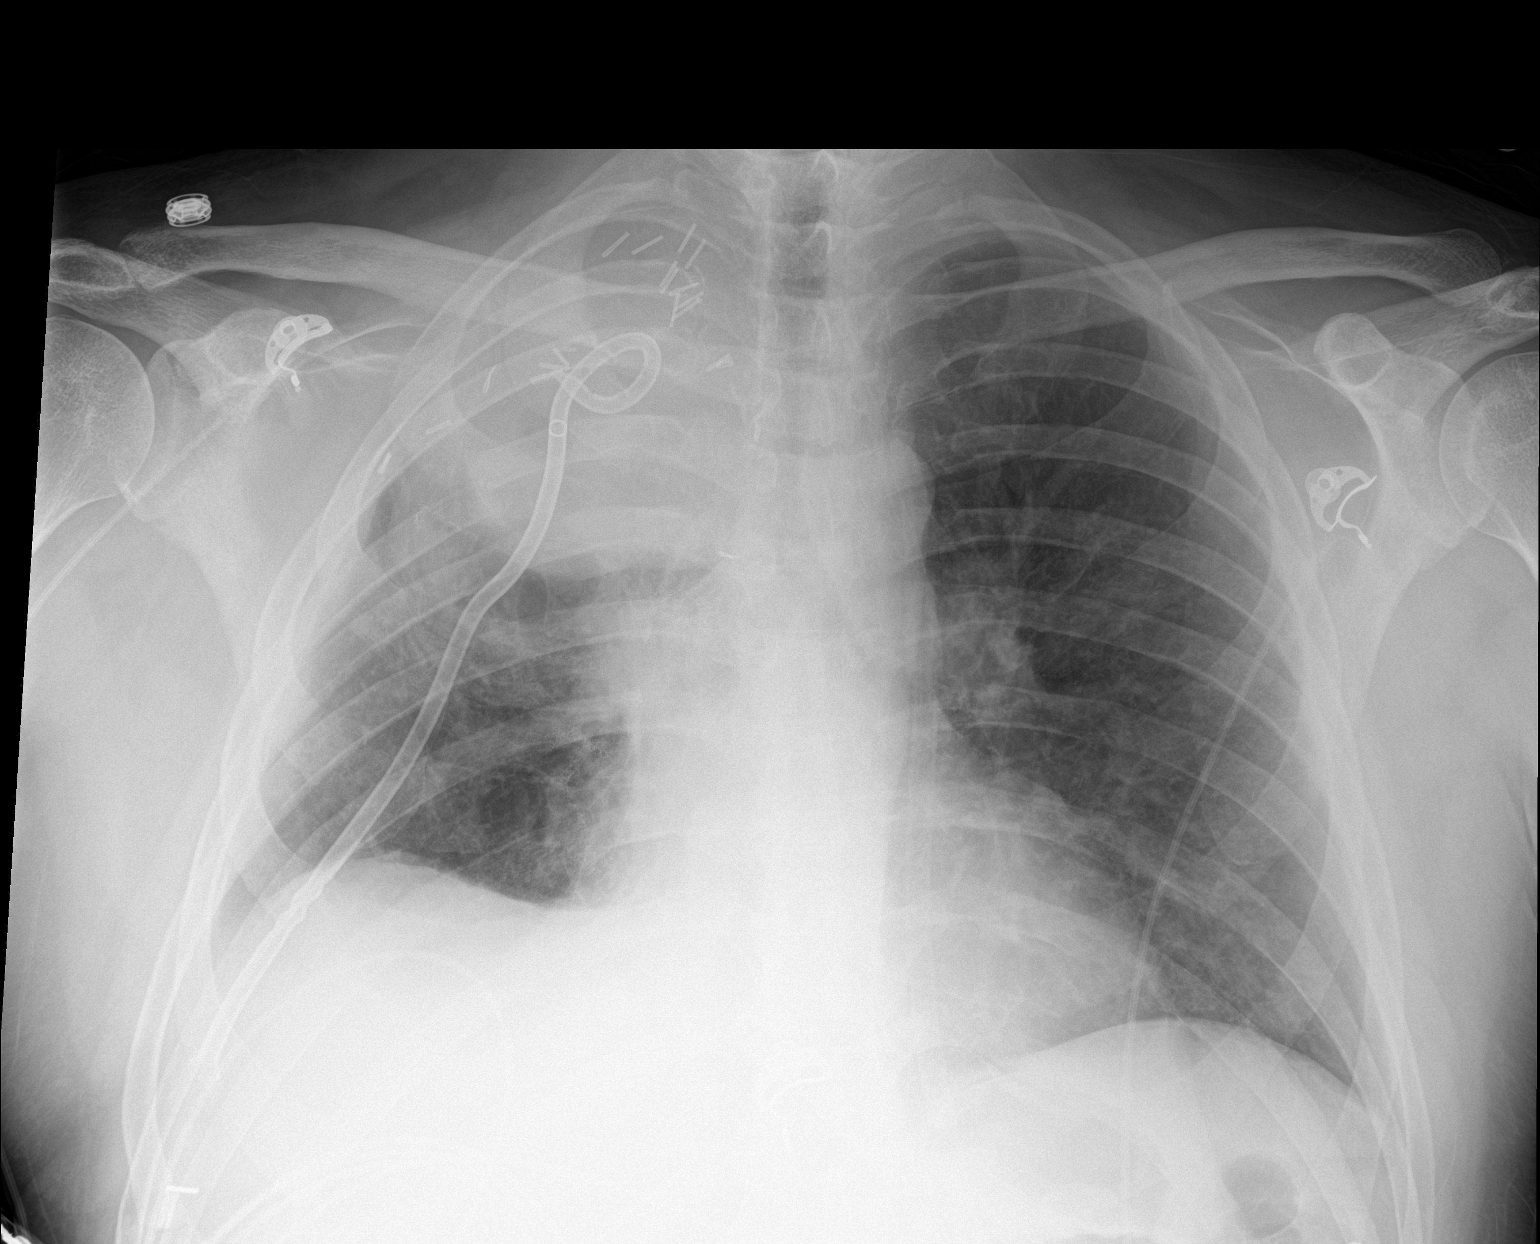

[1 of 1 positions shown; findings below may reference images not displayed]

FINDINGS: Slight interval increase in fluid opacification of the right
pulmonary apex, with a pigtail drainage catheter remaining in
position. Left lung is normally aerated. Heart and mediastinum are
unremarkable.
IMPRESSION: Slight interval increase in fluid opacification of the right
pulmonary apex, with a pigtail drainage catheter remaining in
position. Left lung is normally aerated.

## 2021-04-06 MED ORDER — LEVALBUTEROL HCL 0.63 MG/3ML IN NEBU
0.6300 mg | INHALATION_SOLUTION | Freq: Four times a day (QID) | RESPIRATORY_TRACT | Status: DC
  Administered 2021-04-06 – 2021-04-10 (×16): 0.63 mg via RESPIRATORY_TRACT
  Filled 2021-04-06 (×17): qty 3

## 2021-04-06 MED ORDER — SODIUM CHLORIDE 0.9% IV SOLUTION: Freq: Once | INTRAVENOUS | Status: AC

## 2021-04-06 MED ORDER — AMOXICILLIN-POT CLAVULANATE 875-125 MG PO TABS
1.0000 | ORAL_TABLET | Freq: Two times a day (BID) | ORAL | Status: DC
  Administered 2021-04-06 – 2021-04-10 (×9): 1 via ORAL
  Filled 2021-04-06 (×9): qty 1

## 2021-04-06 MED ORDER — LEVALBUTEROL HCL 0.63 MG/3ML IN NEBU
0.6300 mg | INHALATION_SOLUTION | Freq: Two times a day (BID) | RESPIRATORY_TRACT | Status: DC
  Administered 2021-04-06: 0.63 mg via RESPIRATORY_TRACT
  Filled 2021-04-06: qty 3

## 2021-04-06 MED ORDER — LEVALBUTEROL HCL 0.63 MG/3ML IN NEBU: 0.6300 mg | INHALATION_SOLUTION | RESPIRATORY_TRACT | Status: DC | PRN

## 2021-04-06 NOTE — Plan of Care (Signed)
  Problem: Clinical Measurements: Goal: Diagnostic test results will improve Outcome: Progressing Goal: Respiratory complications will improve Outcome: Progressing Goal: Cardiovascular complication will be avoided Outcome: Progressing   Problem: Activity: Goal: Risk for activity intolerance will decrease Outcome: Progressing   Problem: Coping: Goal: Level of anxiety will decrease Outcome: Progressing   Problem: Elimination: Goal: Will not experience complications related to bowel motility Outcome: Progressing

## 2021-04-06 NOTE — Progress Notes (Signed)
PROGRESS NOTE    Christopher Carter  OQH:[id number] DOB: 1961/08/17 DOA: 03/18/2021 PCP: [name], MD    Brief Narrative:  60 year old M with PMH of stage IV adenocarcinoma s/p RUL lobectomy with node dissection on 5/16, recurrent hemoptysis, chronic RF on 5 L, tobacco abuse, HTN, depression, loculated hemithorax treated with IR chest tube placement, empiric antibiotics and prednisone taper and discharged to follow-up with CTS and radiation oncology.  He returned with cough with hemoptysis, right upper back pain, and increased oxygen requirement.  CT chest showed airspace disease in right lung representing alveolar hemorrhage with recurrence of apical fluid collection, nodular masslike lesion concerning for recurrence of malignancy and right peritracheal adenopathy.  He was admitted to [facility].  CTS, pulmonary IR and palliative consulted.  Started on IV cefepime, systemic steroid and tranexamic acid nebulization.  He underwent bronchoscopy that did not reveal active bleeding. Started on PCA Dilaudid by PMT for pain control, and transferred to [facility] for radiation oncology care.    Patient's hemoptysis resolved, oxygen requirement improved, and made good progression until 7/24 when he had small-volume hemoptysis on tissue papers.  Then, he spiked fever, tachycardia, tachypnea with increased oxygen requirement the early morning of 7/25.  CTA chest negative for large central PE but with new gas in complex right apical collection concerning for communication with the airways, adjacent increase in bilateral airspace disease.  Cultures obtained.  Started on broad-spectrum antibiotics with Vanco and Zosyn.  Transferred to stepdown unit.  CTS consulted and recommended drain placement by IR.    Assessment & Plan:   Principal Problem:   Intra-alveolar hemorrhage Active Problems:   Essential hypertension   Acute on chronic respiratory failure with hypoxia (HCC)   Malignant neoplasm of right upper  lobe of lung (HCC)   Major depressive disorder   Uncontrolled pain   Leukocytosis   Normocytic anemia   Tooth abscess   Thrombocytosis   Palliative care by specialist   Palliative care encounter   Abscess of upper lobe of right lung with pneumonia ([place])  Intra-alveolar hemorrhage in patient with history of recurrent hemoptysis-likely due to lung cancer. 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 s/p RUL lobectomy and node dissection by Dr. Koleen Nimrod on 5/16 Acute on chronic respiratory failure with hypoxia-on NRB.  CXR with new LLL PNA.  High risk for VTE. Severe sepsis: Febrile, tachycardic, tachypnea with acute on chronic hypoxic respiratory failure -7/10-CTA chest concerning for alveolar hemorrhage, loculated fluid at right lung apex lined by masslike nodularity consistent with recurrence of malignancy and right peritracheal LAD -7/11-bronchoscopy on 7/11-no active hemorrhage -7/12-respiratory culture with normal respiratory flora -7/13-Rad/onc-started radiation.  Planned for 6 and half week -7/18-completed 7 days of IV cefepime. -7/19-repeat CTA chest without significant change, may be improved hemorrhage -7/21-started on azithromycin.   -7/23-started on Augmentin for dental abscess -7/24-prednisone resumed at 20 mg daily due to recurrence of hemoptysis -7/25-fever, increased O2 requirement, tachycardia and tachypnea.  CXR with new LLL infiltrate.  CTA chest negative for large central PE but with new gas in complex right apical collection concerning for communication with the airways, adjacent increase in bilateral airspace disease.  CTS consulted and recommended IR drain Blood and sputum cultures obtained and started on vancomycin and Zosyn.   -7/26-significant improvement.  Blood culture and MRSA PCR negative thus Vancomycin discontinued.  Drain was placed by IR -Per CTS, cont drainage with continued abx.  -Bloody drainage per chest tube and streaks of hemoptysis noted. Discussed  with  PCCM. If hemoptysis worsens, would involve IR for consideration for embolization    Uncontrolled cancer-related pain: Rates his pain 6/10 today -Palliative medicine managing -On fentanyl patch 100 mcg, p.o. morphine 7.5 mg PRN and IV fentanyl PRN -Celebrex and gabapentin. -On scheduled and as needed Valium for anxiety -Narcan as needed -comfortable laying in bed    Dental pain/possible infection: Was on penicillin V outpatient.  Plan for root canal Tx on 7/11 but postponed. 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  Hyperglycemia partly due to steroid. -Continue SSI moderate as needed -NovoLog 4 units 3 times daily with meals -Lantus 10 units twice daily    History of anxiety/depression: -On Seroquel, Zoloft and Valium   GERD: -Continue on Protonix   DVT prophylaxis: SCD's Code Status: Full Family Communication: Pt in room, family at bedside  Status is: Inpatient  Remains inpatient appropriate because:Inpatient level of care appropriate due to severity of illness  Dispo: The patient is from: Home              Anticipated d/c is to: Home              Patient currently is not medically stable to d/c.   Difficult to place patient No   Consultants:  PCCM Cardiothoracic surgery IR Palliative medicine Oncology-signed off Radiation oncology Dental surgery-signed off  Procedures:  Drain placed by IR 7/26  Antimicrobials: Anti-infectives (From admission, onward)    Start     Dose/Rate Route Frequency Ordered Stop   04/06/21  1030  amoxicillin-clavulanate (AUGMENTIN) 875-125 MG per tablet 1 tablet        1 tablet Oral Every 12 hours 04/06/21 0955 04/13/21 0959   04/02/21 2100  vancomycin (VANCOREADY) IVPB 1500 mg/300 mL  Status:  Discontinued        1,500 mg 150 mL/hr over 120 Minutes Intravenous Every 12 hours 04/02/21 0740 04/03/21 1115   04/02/21 0900  vancomycin (VANCOREADY) IVPB 1500 mg/300 mL        1,500 mg 150 mL/hr over 120 Minutes Intravenous  Once 04/02/21 0724 04/02/21 1300   04/02/21 0800  piperacillin-tazobactam (ZOSYN) IVPB 3.375 g  Status:  Discontinued        3.375 g 12.5 mL/hr over 240 Minutes Intravenous Every 8 hours 04/02/21 0724 04/06/21 0955   03/30/21 1130  amoxicillin-clavulanate (AUGMENTIN) 875-125 MG per tablet 1 tablet  Status:  Discontinued        1 tablet Oral Every 12 hours 03/30/21 1035 04/02/21 0716   03/29/21 1200  azithromycin (ZITHROMAX) tablet 250 mg  Status:  Discontinued       Note to Pharmacy: For dental infection   250 mg Oral Daily 03/29/21 1100 04/03/21 1115   03/19/21 0900  vancomycin (VANCOREADY) IVPB 1250 mg/250 mL  Status:  Discontinued        1,250 mg 166.7 mL/hr over 90 Minutes Intravenous Every 12 hours 03/18/21 1842 03/20/21 1059   03/18/21 2200  ceFEPIme (MAXIPIME) 2 g in sodium chloride 0.9 % 100 mL IVPB        2 g 200 mL/hr over  30 Minutes Intravenous Every 8 hours 03/18/21 1836 03/26/21 0835   03/18/21 1930  vancomycin (VANCOREADY) IVPB 1500 mg/300 mL        1,500 mg 150 mL/hr over 120 Minutes Intravenous  Once 03/18/21 1836 03/19/21 0734   03/18/21 1915  azithromycin (ZITHROMAX) 500 mg in sodium chloride 0.9 % 250 mL IVPB  Status:  Discontinued        500 mg 250 mL/hr over 60 Minutes Intravenous Every 24 hours 03/18/21 1822 03/19/21 0840   03/18/21 1800  penicillin v potassium (VEETID) tablet 500 mg  Status:  Discontinued       Note to Pharmacy: Start date : 03/14/21     500 mg Oral 4 times daily 03/18/21 1527 03/18/21 1842        Subjective: Reports feeling better.   Objective: Vitals:   04/06/21 1222 04/06/21 1243 04/06/21 1403 04/06/21 1543  BP: 122/71 119/73  118/79  Pulse: (!) 102 100  98  Resp: 14 20  16   Temp: 98.4 F (36.9 C) 98.6 F (37 C)  98.3 F (36.8 C)  TempSrc: Oral Oral  Oral  SpO2: 97% 97% 97% 98%  Weight:      Height:        Intake/Output Summary (Last 24 hours) at 04/06/2021 1733 Last data filed at 04/06/2021 1540 Gross per 24 hour  Intake 1183.7 ml  Output 1525 ml  Net -341.3 ml    Filed Weights   03/18/21 0242 03/25/21 1542 04/02/21 1330  Weight: 79.4 kg 81.7 kg 82.4 kg    Examination: General exam: Awake, laying in bed, in nad Respiratory system: Normal respiratory effort, no wheezing Cardiovascular system: regular rate, s1, s2 Gastrointestinal system: Soft, nondistended, positive BS Central nervous system: CN2-12 grossly intact, strength intact Extremities: Perfused, no clubbing Skin: Normal skin turgor, no notable skin lesions seen Psychiatry: Mood normal // no visual hallucinations   Data Reviewed: I have personally reviewed following labs and imaging studies  CBC: Recent Labs  Lab 04/02/21 0802 04/03/21 0230 04/04/21 0220 04/05/21 0241 04/06/21 0419  WBC 14.0* 11.6* 8.8 10.6* 9.6  NEUTROABS 12.6*  --  7.3  --   --   HGB 9.9* 8.4* 7.4* 7.6* 6.9*  HCT 31.4* 27.4* 24.5* 25.2* 22.0*  MCV 94.0 97.2 96.8 98.1 96.5  PLT 296 232 233 261 831    Basic Metabolic Panel: Recent Labs  Lab 04/01/21 0353 04/02/21 0555 04/02/21 0850 04/03/21 0230 04/05/21 0241 04/06/21 0419  NA 138 134*  --  137 138 139  K 3.7 5.5* 4.0 3.7 4.4 3.8  CL 99 96*  --  99 101 102  CO2 30 28  --  31 31 31   GLUCOSE 220* 133*  --  154* 137* 126*  BUN 19 16  --  17 13 11   CREATININE 0.43* 0.69  --  0.51* 0.60* 0.58*  CALCIUM 8.4* 8.6*  --  8.4* 8.9 8.5*  MG 1.9  --   --  2.3  --   --   PHOS 4.0  --   --  3.2  --   --     GFR: Estimated Creatinine Clearance: 103.9 mL/min (A)  (by C-G formula based on SCr of 0.58 mg/dL (L)). Liver Function Tests: Recent Labs  Lab 04/01/21 0353 04/02/21 0555 04/05/21 0241 04/06/21 0419  AST  --  46* 11* 10*  ALT  --  31 19 18   ALKPHOS  --  86 64 61  BILITOT  --  1.3* 0.4  0.5  PROT  --  6.7 5.7* 5.3*  ALBUMIN 2.5* 2.7* 2.6* 2.4*    No results for input(s): LIPASE, AMYLASE in the last 168 hours. No results for input(s): AMMONIA in the last 168 hours. Coagulation Profile: Recent Labs  Lab 04/03/21 0230  INR 1.2    Cardiac Enzymes: No results for input(s): CKTOTAL, CKMB, CKMBINDEX, TROPONINI in the last 168 hours. BNP (last 3 results) No results for input(s): PROBNP in the last 8760 hours. HbA1C: No results for input(s): HGBA1C in the last 72 hours. CBG: Recent Labs  Lab 04/05/21 1645 04/05/21 2059 04/06/21 0837 04/06/21 1219 04/06/21 1717  GLUCAP 259* 179* 185* 101* 230*    Lipid Profile: No results for input(s): CHOL, HDL, LDLCALC, TRIG, CHOLHDL, LDLDIRECT in the last 72 hours. Thyroid Function Tests: No results for input(s): TSH, T4TOTAL, FREET4, T3FREE, THYROIDAB in the last 72 hours. Anemia Panel: No results for input(s): VITAMINB12, FOLATE, FERRITIN, TIBC, IRON, RETICCTPCT in the last 72 hours. Sepsis Labs: Recent Labs  Lab 04/02/21 0802 04/02/21 1102  LATICACIDVEN 1.8 1.4     Recent Results (from the past 240 hour(s))  Culture, blood (routine x 2)     Status: None (Preliminary result)   Collection Time: 04/02/21  8:02 AM   Specimen: BLOOD  Result Value Ref Range Status   Specimen Description   Final    BLOOD BLOOD LEFT FOREARM Performed at [facility] [address] [place], [place] [address]    Special Requests   Final    BOTTLES DRAWN AEROBIC AND ANAEROBIC Blood Culture adequate volume Performed at [facility] [address], [place] [employer], [place] [address]    Culture   Final    NO GROWTH 3 DAYS Performed at [facility], [place]  [address] [place], [place] [address]    Report Status PENDING  Incomplete  Culture, blood (routine x 2)     Status: None (Preliminary result)   Collection Time: 04/02/21  8:02 AM   Specimen: BLOOD  Result Value Ref Range Status   Specimen Description   Final    BLOOD RIGHT ANTECUBITAL Performed at [facility] [address] [place], [place] [address]    Special Requests   Final    BOTTLES DRAWN AEROBIC AND ANAEROBIC Blood Culture adequate volume Performed at [facility] [address] [place], [place] [address]    Culture   Final    NO GROWTH 3 DAYS Performed at [facility], [place] [address] [place], [place] [address]    Report Status PENDING  Incomplete  MRSA Next Gen by PCR, Nasal     Status: None   Collection Time: 04/02/21  8:04 AM   Specimen: Nasal Mucosa; Nasal Swab  Result Value Ref Range Status   MRSA by PCR Next Gen NOT DETECTED NOT DETECTED Final    Comment: (NOTE) The GeneXpert MRSA Assay (FDA approved for NASAL specimens only), is one component of a comprehensive MRSA colonization surveillance program. It is not intended to diagnose MRSA infection nor to guide or monitor treatment for MRSA infections. Test performance is not FDA approved in patients less than 22 years old. Performed at [facility], [place] [address], [place], [place] [address]   Expectorated Sputum Assessment w Gram Stain, Rflx to Resp Cult     Status: None   Collection Time: 04/02/21 10:27 AM   Specimen: Sputum  Result Value Ref Range Status   Specimen  Description SPUTUM  Final   Special Requests NONE  Final   Sputum evaluation   Final    THIS SPECIMEN IS ACCEPTABLE FOR SPUTUM CULTURE Performed at [facility], [place] [address], [place], [place] [address]    Report Status 04/03/2021 FINAL  Final  Culture, Respiratory w Gram Stain     Status: None   Collection Time: 04/02/21 10:27 AM   Specimen: SPU  Result Value  Ref Range Status   Specimen Description   Final    SPUTUM Performed at [facility] [address] [place], [place] [address]    Special Requests   Final    NONE Reflexed from [id number] Performed at [facility] [address], [place], [place] [address]    Gram Stain   Final    RARE WBC PRESENT,BOTH PMN AND MONONUCLEAR RARE GRAM POSITIVE COCCI IN CLUSTERS RARE GRAM NEGATIVE COCCOBACILLI    Culture   Final    MODERATE Normal respiratory flora-no Staph aureus or Pseudomonas seen Performed at [facility], [address], [place], [place] [address]    Report Status 04/05/2021 FINAL  Final  Body fluid culture w Gram Stain     Status: None (Preliminary result)   Collection Time: 04/04/21  4:20 PM   Specimen: Pleural Fluid  Result Value Ref Range Status   Specimen Description   Final    PLEURAL Performed at [facility] [address], [place], [place] [address]    Special Requests   Final    NONE Performed at [facility], [place] [address] [place], [place] [address]    Gram Stain   Final    RARE WBC PRESENT, PREDOMINANTLY MONONUCLEAR NO ORGANISMS SEEN    Culture   Final    NO GROWTH 2 DAYS Performed at [facility], [place] [address] [place],  [address]    Report Status PENDING  Incomplete      Radiology Studies: DG CHEST PORT 1 VIEW  Result Date: 04/06/2021 CLINICAL DATA:  Cough EXAM: PORTABLE CHEST 1 VIEW COMPARISON:  04/05/2021 FINDINGS: Slight interval increase in fluid opacification of the right pulmonary apex, with a pigtail drainage catheter remaining in position. Left lung is normally aerated. Heart and mediastinum are unremarkable. IMPRESSION: Slight interval increase in fluid opacification of the right pulmonary apex, with a pigtail drainage catheter remaining in position. Left lung is normally aerated. Electronically Signed   By: Eddie Candle M.D.   On: 04/06/2021 12:54    DG Chest Port 1 View  Result Date: 04/05/2021 CLINICAL DATA:  Right chest tube. EXAM: PORTABLE CHEST 1 VIEW COMPARISON:  Chest x-ray 04/04/2021.  CT 04/02/2021. FINDINGS: Surgical clips right upper chest. Right chest tube in stable position. No pneumothorax. Right apical cavitary fluid collection/mass again noted without interim change. No pleural effusion. Stable cardiomegaly. Mild thoracic spine scoliosis. IMPRESSION: 1.  Right chest tube in stable position.  No pneumothorax. 2. Large right apical cavitary fluid collection/mass again noted without interim change. Electronically Signed   By: Marcello Moores  Register   On: 04/05/2021 06:14    Scheduled Meds:  (feeding supplement) PROSource Plus  30 mL Oral BID BM   amoxicillin-clavulanate  1 tablet Oral Q12H   benzonatate  200 mg Oral BID   celecoxib  200 mg Oral BID   Chlorhexidine Gluconate Cloth  6 each Topical Daily   diazepam  1 mg Oral Q8H   feeding supplement  237 mL  Oral BID BM   fentaNYL  1 patch Transdermal Q72H   ferrous sulfate  325 mg Oral Q breakfast   furosemide  40 mg Intravenous Once   gabapentin  300 mg Oral BID WC   gabapentin  600 mg Oral QHS   guaiFENesin  600 mg Oral BID   insulin aspart  0-15 Units Subcutaneous TID WC   insulin aspart  0-5 Units Subcutaneous QHS   insulin aspart  4 Units Subcutaneous TID WC   insulin glargine  10 Units Subcutaneous BID   irbesartan  75 mg Oral Daily   latanoprost  1 drop Both Eyes QHS   levalbuterol  0.63 mg Nebulization Q6H   lidocaine  1 patch Transdermal Q24H   multivitamin with minerals  1 tablet Oral Daily   pantoprazole  40 mg Oral Daily   predniSONE  20 mg Oral Q breakfast   QUEtiapine  100 mg Oral QHS   sertraline  100 mg Oral Daily   sodium chloride flush  3 mL Intravenous Q12H   Continuous Infusions:  tranexamic acid       LOS: 18 days   Marylu Lund, MD [facility] Pager On Amion  If 7PM-7AM, please contact night-coverage 04/06/2021, 5:33 PM

## 2021-04-06 NOTE — Progress Notes (Signed)
NAME:  VANESSA ALESI, MRN:  [id number], DOB:  12-Sep-1960, LOS: 51 ADMISSION DATE:  03/18/2021, CONSULTATION DATE:  03/18/2021 REFERRING MD:  Dr. Tamala Julian, CHIEF COMPLAINT:  Hemoptysis    History of Present Illness:  CONRADO NANCE is a 60 y.o. 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 Patient has been treated at this facility for similar presentation and underwent video bronchoscopy and IR placed chest tube for pleural effusion with Dr. Roxan Hockey. He was discharged in stable condition with plans to follow up with cardiothoracic surgery and oncology.  He presented this admission with recurrent hemoptysis that started 4hr prior to admission with associated right upper back pain that radiates to upper neck. Patient was transferred from [place] to [facility] for cardiothoracic, pulmonary, and IR consults. Vital signs stable, WBC elevated at 18.1. CT chest was obtained and consistent with alveolar hemorrhage localized to the right with right apical fluid collection.   Pertinent  Medical History  Stage IIIa adenocarcinoma now S/P right upper lobectomy with node dissection 01/22/2021, on 5L [place] at baseline, tobacco abuse, HTN, and depression   Significant Hospital Events:  7/10 admitted with recurrent hemoptysis  7/11 bronch- nonfocal source, had bloody frothy secretions in all airways, petechiae on bronchi Receiving radiation therapy Pain is better controlled 7/25 worsening hypoxia and new temp to 104 7/26 chest tube by IR planned 7/27 chest tube successfully placed, down to 3 L nasal cannula  Interim History / Subjective:   Continues to slowly improve, down to 1L [place] O2, mobilizing, Hgb did drift down to 6.9.   Objective   Blood pressure 122/74, pulse (!) 101, temperature 97.7 F (36.5 C),  resp. rate 11, height 5\' 10"  (1.778 m), weight 82.4 kg, SpO2 97 %.        Intake/Output Summary (Last 24 hours) at 04/06/2021 0723 Last data filed at 04/06/2021 0300 Gross per 24 hour  Intake 123.23 ml  Output 1295 ml  Net -1171.77 ml    Filed Weights   03/18/21 0242 03/25/21 1542 04/02/21 1330  Weight: 79.4 kg 81.7 kg 82.4 kg    General: Chronically ill-appearing male, awake and alert in no distress HEENT: MM pink/moist, sclera nonicteric Neuro: Awake, oriented x4, moving all extremities, no focal deficits CV: s1s2 rrr, no m/r/g PULM: Right chest tube in place with continued bloody serosanguineous drainage, no pain surrounding insertion site, slightly decreased air entry right lower lobe, otherwise clear bilaterally without rhonchi or wheezing GI: soft, bsx4 active  Extremities: warm/dry, no edema  Skin: no rashes or lesions   Resolved Hospital Problem list     Assessment & Plan:   Acute on chronic hypoxemic respiratory failure - presumed 2/2 PNA with probable lung abscess RUL. Final culture from pleural fluid pending Respiratory culture with rare normal respiratory flora Chest tube with> 1 L out displacement, continued serosanguineous drainage P: -Right apical collection improved on CXR, CVTS following, recommend keep tube in place  -Follow pleural fluid culture to completion, continue vancomycin and Zosyn for now -Continue Xopenex scheduled and as needed nebulizers -Continue mobilization  -S/p RUL chest tube placement by IR on 7/26 -Sputum culture from 7/25 initially growing gram-positive cocci and gram negative coccobacilli speciation pending, continue vancomycin and Zosyn -send pleural fluid for culture -Cardiothoracic surgery is following -Continue working towards getting out of bed and ambulating  with PT   Normocytic anemia Hemoglobin slowly drifting down from 9.94 days ago to 6.9 today, likely secondary to continued blood-tinged drainage from right lung along  with prior hemoptysis P: -1 unit PRBCs ordered, monitor hemoglobin   Hemoptysis  Improving -Continue prednisone 20 mg daily and monitor -Hemoglobin 7.4 today, trend and transfuse for hemoglobin<7   Stage IV adenocarcinoma of the lung s/p right upper lobectomy. -Supportive care and outpatient oncology follow-up   Pain control - 2/2 above. - Continue pain control per palliative medicine, fentanyl adjusted and patient feels that pain is currently controlled  DM  anticipate slight worsening of CBG's with steroids. -Continue sliding scale insulin and Lantus  Dental pain / possible infection  had been treated with PenV as outpatient with planned root canal 7/11; however, this was postponed. - Dental surgery recommending abx and outpatient follow up.   Best Practice   Diet/type: Regular consistency (see orders) DVT prophylaxis: SCD GI prophylaxis: N/A Lines: N/A Foley:  N/A Code Status:  full code Last date of multidisciplinary goals of care discussion: Per primary    Otilio Carpen Monea Pesantez, PA-C [facility] See Amion for pager If no response to pager , please call [phone] until 7pm After 7:00 pm call Elink  [phone]?[place]

## 2021-04-06 NOTE — Progress Notes (Signed)
Referring Physician(s): Hendrickson,S  Supervising Physician: Sandi Mariscal  Patient Status:  [facility] - In-pt  Chief Complaint: Lung cancer, cough, dyspnea, right apical fluid collection   Subjective: Patient doing fair, continues to cough, had small amount of blood-tinged sputum earlier today; states breathing has not worsened.   Allergies: Oxycodone hcl and Bupropion  Medications: Prior to Admission medications   Medication Sig Start Date End Date Taking? Authorizing Provider  acetaminophen (TYLENOL) 500 MG tablet Take 1,000 mg by mouth every 8 (eight) hours as needed for moderate pain or headache.   Yes [provider]  Ascorbic Acid (VITAMIN C) 1000 MG tablet Take 1,000 mg by mouth daily.   Yes [provider]  gabapentin (NEURONTIN) 300 MG capsule Take 300-600 mg by mouth See admin instructions. Takes 300 mg in the morning and afternoon and 600 mg at night 03/12/21  Yes [provider]  HYDROmorphone (DILAUDID) 2 MG tablet Take 1-2 tablets (2-4 mg total) by mouth every 4 (four) hours as needed for severe pain. Patient taking differently: Take 4 mg by mouth every 4 (four) hours as needed for severe pain. 03/13/21  Yes Conte, Tessa N, PA-C  irbesartan (AVAPRO) 300 MG tablet Take 300 mg by mouth daily.   Yes [provider]  latanoprost (XALATAN) 0.005 % ophthalmic solution Place 1 drop into both eyes at bedtime.   Yes [provider]  lidocaine (LIDODERM) 5 % Place 1 patch onto the skin daily. Remove & Discard patch within 12 hours or as directed by MD 03/07/21  Yes Regalado, Belkys A, MD  pantoprazole (PROTONIX) 40 MG tablet Take 1 tablet (40 mg total) by mouth daily. 03/08/21  Yes Regalado, Belkys A, MD  penicillin v potassium (VEETID) 500 MG tablet Take 500 mg by mouth 4 (four) times daily. Start date : 03/14/21 03/14/21  Yes [provider]  polyethylene glycol (MIRALAX / GLYCOLAX) 17 g packet Take 17 g by mouth daily as needed for  mild constipation. 03/07/21  Yes Regalado, Belkys A, MD  potassium chloride SA (KLOR-CON) 20 MEQ tablet Take 1 tablet (20 mEq total) by mouth daily. 03/07/21  Yes Regalado, Belkys A, MD  predniSONE (DELTASONE) 20 MG tablet Take 40 mg daily for 5 days, then 20 mg daily for 5 days then 10 mg daily for 5 days then stop. Patient taking differently: Take 20 mg by mouth daily with breakfast. Take 40 mg daily for 5 days, then 20 mg daily for 5 days then 10 mg daily for 5 days then stop. 03/07/21  Yes Regalado, Belkys A, MD  QUEtiapine (SEROQUEL) 50 MG tablet Take 100 mg by mouth at bedtime. 11/06/20  Yes [provider]  sertraline (ZOLOFT) 100 MG tablet Take 100 mg by mouth daily. 10/29/20  Yes [provider]  vitamin B-12 (CYANOCOBALAMIN) 1000 MCG tablet Take 1,000 mcg by mouth daily.   Yes [provider]  amLODipine (NORVASC) 10 MG tablet TAKE 1 TABLET BY MOUTH EVERY DAY 04/04/21   [name], MD  furosemide (LASIX) 20 MG tablet Take 1 tablet (20 mg total) by mouth daily. Patient not taking: No sig reported 03/07/21 03/18/21  Niel Hummer A, MD     Vital Signs: BP 119/73   Pulse 100   Temp 98.6 F (37 C) (Oral)   Resp 20   Ht 5\' 10"  (1.778 m)   Wt 181 lb 10.5 oz (82.4 kg)   SpO2 97%   BMI 26.07 kg/m   Physical Exam  awake, alert.  Right anterior chest tube intact, insertion site okay, mildly tender, output 120 cc of bloody fluid, no obvious air leak with Pleur-evac to suction  Imaging: DG Chest Port 1 View  Result Date: 04/05/2021 CLINICAL DATA:  Right chest tube. EXAM: PORTABLE CHEST 1 VIEW COMPARISON:  Chest x-ray 04/04/2021.  CT 04/02/2021. FINDINGS: Surgical clips right upper chest. Right chest tube in stable position. No pneumothorax. Right apical cavitary fluid collection/mass again noted without interim change. No pleural effusion. Stable cardiomegaly. Mild thoracic spine scoliosis. IMPRESSION: 1.  Right chest tube in stable position.  No  pneumothorax. 2. Large right apical cavitary fluid collection/mass again noted without interim change. Electronically Signed   By: Marcello Moores  Register   On: 04/05/2021 06:14   DG Chest Port 1 View  Result Date: 04/04/2021 CLINICAL DATA:  Chest tube.  Pleural effusion. EXAM: PORTABLE CHEST 1 VIEW COMPARISON:  04/03/2021. FINDINGS: Right chest tube noted over the right upper chest. No pneumothorax. Large right apical fluid collection/mass again noted without interim change. Low lung volumes with mild bibasilar atelectasis. Stable right-sided pleural thickening. No pleural effusion. Heart size stable. IMPRESSION: 1. Right chest tube noted over the right upper chest. No pneumothorax. 2. Large right apical fluid collection/mass again noted without interim change. Low lung volumes with mild bibasilar atelectasis. Electronically Signed   By: Marcello Moores  Register   On: 04/04/2021 08:26   DG Chest Port 1 View  Result Date: 04/03/2021 CLINICAL DATA:  Hemoptysis. EXAM: PORTABLE CHEST 1 VIEW COMPARISON:  CT 04/02/2021.  Chest x-ray 04/02/2021. FINDINGS: Heart size stable. Large right apical fluid collection/mass again noted without interim change. Low lung volumes with persistent bibasilar atelectasis. Mild bibasilar infiltrates again noted. Stable right-sided pleural thickening. No pneumothorax. Surgical clips right chest. Mild thoracic spine scoliosis. IMPRESSION: 1. Large right apical fluid collection/mass again noted without interim change. 2. Low lung volumes with persistent bibasilar atelectasis. Mild bibasilar infiltrates again noted. Electronically Signed   By: Marcello Moores  Register   On: 04/03/2021 07:26   CT IMAGE GUIDED DRAINAGE BY PERCUTANEOUS CATHETER  Result Date: 04/03/2021 CLINICAL DATA:  Loculated effusion post partial pneumonectomy EXAM: CT GUIDED CHEST DRAIN PLACEMENT ANESTHESIA/SEDATION: Intravenous Fentanyl 101mcg and Versed 0.5mg  were administered as conscious sedation during continuous monitoring of the  patient's level of consciousness and physiological / cardiorespiratory status by the radiology RN, with a total moderate sedation time of 22 minutes. PROCEDURE: The procedure, risks, benefits, and alternatives were explained to the patient. Questions regarding the procedure were encouraged and answered. The patient understands and consents to the procedure. Select axial scans through the thorax were obtained. The loculated right apical collection was localized and an appropriate skin entry site was determined and marked. The operative field was prepped with chlorhexidinein a sterile fashion, and a sterile drape was applied covering the operative field. A sterile gown and sterile gloves were used for the procedure. Local anesthesia was provided with 1% Lidocaine. Under CT fluoroscopic guidance, 18 gauge percutaneous entry needle advanced to the collection. Amplatz wire advanced within the collection easily, position confirmed on CT. Tract dilated to facilitate placement of a 14 French pigtail drain catheter, position within the central dependent aspect of the collection. Bloody material returned. Catheter was secured externally 0 Prolene suture and StatLock and placed to Pleur-evac -20 cm H2O drainage. Site covered with sterile gauze and Vaseline gauze dressing. The patient tolerated the procedure well. COMPLICATIONS: None immediate FINDINGS: Loculated right apical collection was localized. 14 French pigtail drain catheter placed as chest  drain. IMPRESSION: 1. Technically successful CT-guided right chest drain placement Electronically Signed   By: Lucrezia Europe M.D.   On: 04/03/2021 16:00    Labs:  CBC: Recent Labs    04/03/21 0230 04/04/21 0220 04/05/21 0241 04/06/21 0419  WBC 11.6* 8.8 10.6* 9.6  HGB 8.4* 7.4* 7.6* 6.9*  HCT 27.4* 24.5* 25.2* 22.0*  PLT 232 233 261 246    COAGS: Recent Labs    01/18/21 1500 02/25/21 2204 03/18/21 0246 03/18/21 1609 03/19/21 0215 04/03/21 0230  INR 1.0   < >  1.0 1.0 1.1 1.2  APTT 28  --   --   --  30  --    < > = values in this interval not displayed.    BMP: Recent Labs    04/02/21 0555 04/02/21 0850 04/03/21 0230 04/05/21 0241 04/06/21 0419  NA 134*  --  137 138 139  K 5.5* 4.0 3.7 4.4 3.8  CL 96*  --  99 101 102  CO2 28  --  31 31 31   GLUCOSE 133*  --  154* 137* 126*  BUN 16  --  17 13 11   CALCIUM 8.6*  --  8.4* 8.9 8.5*  CREATININE 0.69  --  0.51* 0.60* 0.58*  GFRNONAA >60  --  >60 >60 >60    LIVER FUNCTION TESTS: Recent Labs    03/15/21 1254 03/27/21 0749 04/01/21 0353 04/02/21 0555 04/05/21 0241 04/06/21 0419  BILITOT 0.8  --   --  1.3* 0.4 0.5  AST 12*  --   --  46* 11* 10*  ALT 36  --   --  31 19 18   ALKPHOS 94  --   --  86 64 61  PROT 6.7  --   --  6.7 5.7* 5.3*  ALBUMIN 3.1*   < > 2.5* 2.7* 2.6* 2.4*   < > = values in this interval not displayed.    Assessment and Plan: Patient with history of stage IV lung cancer with prior right upper lobectomy in May 2022 and chest tube placement in June for loculated right apical effusion with eventual subsequent removal.  Now admitted with recurrent hemoptysis and fever and imaging revealing recurrent right apical complex fluid collection, status post right chest drain placement on 7/26; afebrile, output 120 cc blood-tinged fluid, WBC normal, hemoglobin 6.9(7.6)- getting transfusion; drain fluid cultures neg to date; CXR results pend today; further plans as outlined by TCTS/CCM   Electronically Signed: D. Rowe Robert, PA-C 04/06/2021, 12:50 PM   I spent a total of 15 Minutes at the the patient's bedside AND on the patient's hospital floor or unit, greater than 50% of which was counseling/coordinating care for right chest tube    Patient ID: Christopher Carter, Christopher Carter   DOB: 05-08-1961, 61 y.o.   MRN: [id number]

## 2021-04-07 ENCOUNTER — Inpatient Hospital Stay (HOSPITAL_COMMUNITY): Payer: 59

## 2021-04-07 DIAGNOSIS — J9621 Acute and chronic respiratory failure with hypoxia: Secondary | ICD-10-CM | POA: Diagnosis not present

## 2021-04-07 DIAGNOSIS — I10 Essential (primary) hypertension: Secondary | ICD-10-CM | POA: Diagnosis not present

## 2021-04-07 DIAGNOSIS — R0489 Hemorrhage from other sites in respiratory passages: Secondary | ICD-10-CM | POA: Diagnosis not present

## 2021-04-07 DIAGNOSIS — R042 Hemoptysis: Secondary | ICD-10-CM

## 2021-04-07 LAB — COMPREHENSIVE METABOLIC PANEL
ALT: 18 U/L (ref 0–44)
AST: 9 U/L — ABNORMAL LOW (ref 15–41)
Albumin: 2.6 g/dL — ABNORMAL LOW (ref 3.5–5.0)
Alkaline Phosphatase: 60 U/L (ref 38–126)
Anion gap: 8 (ref 5–15)
BUN: 11 mg/dL (ref 6–20)
CO2: 32 mmol/L (ref 22–32)
Calcium: 9.1 mg/dL (ref 8.9–10.3)
Chloride: 99 mmol/L (ref 98–111)
Creatinine, Ser: 0.53 mg/dL — ABNORMAL LOW (ref 0.61–1.24)
GFR, Estimated: >60 mL/min (ref >60)
Glucose, Bld: 110 mg/dL — ABNORMAL HIGH (ref 70–99)
Potassium: 3.9 mmol/L (ref 3.5–5.1)
Sodium: 139 mmol/L (ref 135–145)
Total Bilirubin: 0.5 mg/dL (ref 0.3–1.2)
Total Protein: 5.9 g/dL — ABNORMAL LOW (ref 6.5–8.1)

## 2021-04-07 LAB — CBC
HCT: 26.3 % — ABNORMAL LOW (ref 39.0–52.0)
Hemoglobin: 8.1 g/dL — ABNORMAL LOW (ref 13.0–17.0)
MCH: 29.9 pg (ref 26.0–34.0)
MCHC: 30.8 g/dL (ref 30.0–36.0)
MCV: 97 fL (ref 80.0–100.0)
Platelets: 267 10*3/uL (ref 150–400)
RBC: 2.71 MIL/uL — ABNORMAL LOW (ref 4.22–5.81)
RDW: 15.1 % (ref 11.5–15.5)
WBC: 9.8 10*3/uL (ref 4.0–10.5)
nRBC: 0.2 % (ref 0.0–0.2)

## 2021-04-07 LAB — BPAM RBC
Blood Product Expiration Date: 202209012359
ISSUE DATE / TIME: 202207291215
Unit Type and Rh: 5100

## 2021-04-07 LAB — CULTURE, BLOOD (ROUTINE X 2)
Culture: NO GROWTH
Culture: NO GROWTH
Special Requests: ADEQUATE
Special Requests: ADEQUATE

## 2021-04-07 LAB — GLUCOSE, CAPILLARY
Glucose-Capillary: 101 mg/dL — ABNORMAL HIGH (ref 70–99)
Glucose-Capillary: 200 mg/dL — ABNORMAL HIGH (ref 70–99)
Glucose-Capillary: 250 mg/dL — ABNORMAL HIGH (ref 70–99)
Glucose-Capillary: 183 mg/dL — ABNORMAL HIGH (ref 70–99)

## 2021-04-07 LAB — TYPE AND SCREEN
ABO/RH(D): O POS
Antibody Screen: NEGATIVE
Unit division: 0

## 2021-04-07 NOTE — Plan of Care (Signed)
  Problem: Education: Goal: Knowledge of General Education information will improve Description: Including pain rating scale, medication(s)/side effects and non-pharmacologic comfort measures Outcome: Progressing   Problem: Health Behavior/Discharge Planning: Goal: Ability to manage health-related needs will improve Outcome: Progressing   Problem: Clinical Measurements: Goal: Ability to maintain clinical measurements within normal limits will improve Outcome: Progressing Goal: Will remain free from infection Outcome: Progressing Goal: Diagnostic test results will improve Outcome: Progressing Goal: Respiratory complications will improve Outcome: Progressing Goal: Cardiovascular complication will be avoided Outcome: Progressing   Problem: Activity: Goal: Risk for activity intolerance will decrease Outcome: Progressing   Problem: Nutrition: Goal: Adequate nutrition will be maintained Outcome: Progressing   Problem: Coping: Goal: Level of anxiety will decrease Outcome: Progressing   Problem: Elimination: Goal: Will not experience complications related to bowel motility Outcome: Progressing Goal: Will not experience complications related to urinary retention Outcome: Progressing   Problem: Pain Managment: Goal: General experience of comfort will improve Outcome: Progressing   Problem: Safety: Goal: Ability to remain free from injury will improve Outcome: Progressing   Problem: Skin Integrity: Goal: Risk for impaired skin integrity will decrease Outcome: Progressing   Problem: Activity: Goal: Ability to tolerate increased activity will improve Outcome: Progressing   Problem: Clinical Measurements: Goal: Ability to maintain a body temperature in the normal range will improve Outcome: Progressing   Problem: Respiratory: Goal: Ability to maintain adequate ventilation will improve Outcome: Progressing Goal: Ability to maintain a clear airway will improve Outcome:  Progressing   Problem: Education: Goal: Knowledge of General Education information will improve Description: Including pain rating scale, medication(s)/side effects and non-pharmacologic comfort measures Outcome: Progressing   Problem: Health Behavior/Discharge Planning: Goal: Ability to manage health-related needs will improve Outcome: Progressing   Problem: Clinical Measurements: Goal: Ability to maintain clinical measurements within normal limits will improve Outcome: Progressing Goal: Will remain free from infection Outcome: Progressing Goal: Diagnostic test results will improve Outcome: Progressing Goal: Respiratory complications will improve Outcome: Progressing Goal: Cardiovascular complication will be avoided Outcome: Progressing   Problem: Activity: Goal: Risk for activity intolerance will decrease Outcome: Progressing   Problem: Nutrition: Goal: Adequate nutrition will be maintained Outcome: Progressing   Problem: Coping: Goal: Level of anxiety will decrease Outcome: Progressing   Problem: Elimination: Goal: Will not experience complications related to bowel motility Outcome: Progressing Goal: Will not experience complications related to urinary retention Outcome: Progressing   Problem: Pain Managment: Goal: General experience of comfort will improve Outcome: Progressing   Problem: Safety: Goal: Ability to remain free from injury will improve Outcome: Progressing   Problem: Skin Integrity: Goal: Risk for impaired skin integrity will decrease Outcome: Progressing

## 2021-04-07 NOTE — Progress Notes (Signed)
PROGRESS NOTE    Christopher Carter  XFG:[id number] DOB: [birth year] DOA: 03/18/2021 PCP: [name], MD    Brief Narrative:  60 year old M with PMH of stage IV adenocarcinoma s/p RUL lobectomy with node dissection on 5/16, recurrent hemoptysis, chronic RF on 5 L, tobacco abuse, HTN, depression, loculated hemithorax treated with IR chest tube placement, empiric antibiotics and prednisone taper and discharged to follow-up with CTS and radiation oncology.  He returned with cough with hemoptysis, right upper back pain, and increased oxygen requirement.  CT chest showed airspace disease in right lung representing alveolar hemorrhage with recurrence of apical fluid collection, nodular masslike lesion concerning for recurrence of malignancy and right peritracheal adenopathy.  He was admitted to [facility].  CTS, pulmonary IR and palliative consulted.  Started on IV cefepime, systemic steroid and tranexamic acid nebulization.  He underwent bronchoscopy that did not reveal active bleeding. Started on PCA Dilaudid by PMT for pain control, and transferred to [facility] for radiation oncology care.    Patient's hemoptysis resolved, oxygen requirement improved, and made good progression until 7/24 when he had small-volume hemoptysis on tissue papers.  Then, he spiked fever, tachycardia, tachypnea with increased oxygen requirement the early morning of 7/25.  CTA chest negative for large central PE but with new gas in complex right apical collection concerning for communication with the airways, adjacent increase in bilateral airspace disease.  Cultures obtained.  Started on broad-spectrum antibiotics with Vanco and Zosyn.  Transferred to stepdown unit.  CTS consulted and recommended drain placement by IR.    Assessment & Plan:   Principal Problem:   Intra-alveolar hemorrhage Active Problems:   Essential hypertension   Acute on chronic respiratory failure with hypoxia (HCC)   Malignant neoplasm of right upper  lobe of lung (HCC)   Major depressive disorder   Uncontrolled pain   Leukocytosis   Normocytic anemia   Tooth abscess   Thrombocytosis   Palliative care by specialist   Palliative care encounter   Abscess of upper lobe of right lung with pneumonia ([place])  Intra-alveolar hemorrhage in patient with history of recurrent hemoptysis-likely due to lung cancer. 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 s/p RUL lobectomy and node dissection by Dr. Koleen Nimrod on 5/16 Acute on chronic respiratory failure with hypoxia-on NRB.  CXR with new LLL PNA.  High risk for VTE. Severe sepsis: Febrile, tachycardic, tachypnea with acute on chronic hypoxic respiratory failure -7/10-CTA chest concerning for alveolar hemorrhage, loculated fluid at right lung apex lined by masslike nodularity consistent with recurrence of malignancy and right peritracheal LAD -7/11-bronchoscopy on 7/11-no active hemorrhage -7/12-respiratory culture with normal respiratory flora -7/13-Rad/onc-started radiation.  Planned for 6 and half week -7/18-completed 7 days of IV cefepime. -7/19-repeat CTA chest without significant change, may be improved hemorrhage -7/21-started on azithromycin.   -7/23-started on Augmentin for dental abscess -7/24-prednisone resumed at 20 mg daily due to recurrence of hemoptysis -7/25-fever, increased O2 requirement, tachycardia and tachypnea.  CXR with new LLL infiltrate.  CTA chest negative for large central PE but with new gas in complex right apical collection concerning for communication with the airways, adjacent increase in bilateral airspace disease.  CTS consulted and recommended IR drain Blood and sputum cultures obtained and started on vancomycin and Zosyn.   -7/26-significant improvement.  Blood culture and MRSA PCR negative thus Vancomycin discontinued.  Drain was placed by IR -Per CTS, place chest drain to water seal with plan to remove tomorrow -Continued abx per CTS  Uncontrolled cancer-related  pain: Rates his pain 6/10 today -Palliative medicine managing -On fentanyl patch 100 mcg, p.o. morphine 7.5 mg PRN and IV fentanyl PRN -Celebrex and gabapentin. -On scheduled and as needed Valium for anxiety -Narcan as needed -comfortable laying in bed, conversant    Dental pain/possible infection: Was on penicillin V outpatient.  Plan for root canal Tx on 7/11 but postponed. 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  Hyperglycemia partly due to steroid. -Continue SSI moderate as needed -NovoLog 4 units 3 times daily with meals -Lantus 10 units twice daily    History of anxiety/depression: -On Seroquel, Zoloft and Valium   GERD: -Continue on Protonix   DVT prophylaxis: SCD's Code Status: Full Family Communication: Pt in room, family at bedside  Status is: Inpatient  Remains inpatient appropriate because:Inpatient level of care appropriate due to severity of illness  Dispo: The patient is from: Home              Anticipated d/c is to: Home              Patient currently is not medically stable to d/c.   Difficult to place patient No   Consultants:  PCCM Cardiothoracic surgery IR Palliative medicine Oncology-signed off Radiation oncology Dental surgery-signed off  Procedures:  Drain placed by IR 7/26  Antimicrobials: Anti-infectives (From admission, onward)    Start     Dose/Rate Route Frequency Ordered Stop   04/06/21 1030  amoxicillin-clavulanate (AUGMENTIN) 875-125 MG per tablet 1 tablet        1 tablet Oral Every 12  hours 04/06/21 0955 04/13/21 0959   04/02/21 2100  vancomycin (VANCOREADY) IVPB 1500 mg/300 mL  Status:  Discontinued        1,500 mg 150 mL/hr over 120 Minutes Intravenous Every 12 hours 04/02/21 0740 04/03/21 1115   04/02/21 0900  vancomycin (VANCOREADY) IVPB 1500 mg/300 mL        1,500 mg 150 mL/hr over 120 Minutes Intravenous  Once 04/02/21 0724 04/02/21 1300   04/02/21 0800  piperacillin-tazobactam (ZOSYN) IVPB 3.375 g  Status:  Discontinued        3.375 g 12.5 mL/hr over 240 Minutes Intravenous Every 8 hours 04/02/21 0724 04/06/21 0955   03/30/21 1130  amoxicillin-clavulanate (AUGMENTIN) 875-125 MG per tablet 1 tablet  Status:  Discontinued        1 tablet Oral Every 12 hours 03/30/21 1035 04/02/21 0716   03/29/21 1200  azithromycin (ZITHROMAX) tablet 250 mg  Status:  Discontinued       Note to Pharmacy: For dental infection   250 mg Oral Daily 03/29/21 1100 04/03/21 1115   03/19/21 0900  vancomycin (VANCOREADY) IVPB 1250 mg/250 mL  Status:  Discontinued        1,250 mg 166.7 mL/hr over 90 Minutes Intravenous Every 12 hours 03/18/21 1842 03/20/21 1059   03/18/21 2200  ceFEPIme (MAXIPIME) 2 g in sodium chloride 0.9 % 100 mL IVPB        2 g 200 mL/hr over 30 Minutes Intravenous Every 8 hours 03/18/21 1836 03/26/21 0835   03/18/21 1930  vancomycin (VANCOREADY) IVPB 1500 mg/300 mL        1,500 mg 150 mL/hr over 120 Minutes Intravenous  Once 03/18/21 1836 03/19/21 0734   03/18/21 1915  azithromycin (ZITHROMAX) 500 mg in sodium chloride 0.9 % 250 mL IVPB  Status:  Discontinued        500 mg 250 mL/hr over 60 Minutes Intravenous Every 24 hours 03/18/21 1822 03/19/21 0840   03/18/21 1800  penicillin v potassium (VEETID) tablet 500 mg  Status:  Discontinued       Note to Pharmacy: Start date : 03/14/21     500 mg Oral 4 times daily 03/18/21 1527 03/18/21 1842       Subjective: States feeling better  Objective: Vitals:   04/07/21 0142 04/07/21 0419 04/07/21 0816 04/07/21 1224  BP:   136/76  117/71  Pulse:  89  94  Resp:    18  Temp:  97.8 F (36.6 C)  98.3 F (36.8 C)  TempSrc:  Oral  Oral  SpO2: 99% 99% 95% 98%  Weight:      Height:        Intake/Output Summary (Last 24 hours) at 04/07/2021 1741 Last data filed at 04/07/2021 1646 Gross per 24 hour  Intake 783 ml  Output 1637 ml  Net -854 ml    Filed Weights   03/18/21 0242 03/25/21 1542 04/02/21 1330  Weight: 79.4 kg 81.7 kg 82.4 kg    Examination: General exam: Conversant, in no acute distress Respiratory system: normal chest rise, clear, no audible wheezing Cardiovascular system: regular rhythm, s1-s2 Gastrointestinal system: Nondistended, nontender, pos BS Central nervous system: No seizures, no tremors Extremities: No cyanosis, no joint deformities Skin: No rashes, no pallor Psychiatry: Affect normal // no auditory hallucinations   Data Reviewed: I have personally reviewed following labs and imaging studies  CBC: Recent Labs  Lab 04/02/21 0802 04/03/21 0230 04/04/21 0220 04/05/21 0241 04/06/21 0419 04/06/21 1944 04/07/21 0431  WBC 14.0* 11.6* 8.8 10.6* 9.6  --  9.8  NEUTROABS 12.6*  --  7.3  --   --   --   --   HGB 9.9* 8.4* 7.4* 7.6* 6.9* 8.9* 8.1*  HCT 31.4* 27.4* 24.5* 25.2* 22.0* 28.8* 26.3*  MCV 94.0 97.2 96.8 98.1 96.5  --  97.0  PLT 296 232 233 261 246  --  500    Basic Metabolic Panel: Recent Labs  Lab 04/01/21 0353 04/02/21 0555 04/02/21 0850 04/03/21 0230 04/05/21 0241 04/06/21 0419 04/07/21 0431  NA 138 134*  --  137 138 139 139  K 3.7 5.5* 4.0 3.7 4.4 3.8 3.9  CL 99 96*  --  99 101 102 99  CO2 30 28  --  31 31 31  32  GLUCOSE 220* 133*  --  154* 137* 126* 110*  BUN 19 16  --  17 13 11 11   CREATININE 0.43* 0.69  --  0.51* 0.60* 0.58* 0.53*  CALCIUM 8.4* 8.6*  --  8.4* 8.9 8.5* 9.1  MG 1.9  --   --  2.3  --   --   --   PHOS 4.0  --   --  3.2  --   --   --     GFR: Estimated Creatinine Clearance: 103.9 mL/min (A) (by C-G formula based on SCr of 0.53 mg/dL  (L)). Liver Function Tests: Recent Labs  Lab 04/01/21 0353 04/02/21 0555 04/05/21 0241 04/06/21 0419 04/07/21 0431  AST  --  46* 11* 10* 9*  ALT  --  31 19 18 18   ALKPHOS  --  86 64 61 60  BILITOT  --  1.3* 0.4 0.5 0.5  PROT  --  6.7 5.7* 5.3* 5.9*  ALBUMIN 2.5* 2.7* 2.6* 2.4* 2.6*    No results for input(s): LIPASE, AMYLASE in the last 168 hours. No results for input(s): AMMONIA in the last 168 hours. Coagulation Profile: Recent Labs  Lab 04/03/21 0230  INR 1.2    Cardiac Enzymes: No results for input(s): CKTOTAL, CKMB, CKMBINDEX, TROPONINI in the last 168 hours. BNP (last 3 results) No results for input(s): PROBNP in the last 8760 hours. HbA1C: No results for input(s): HGBA1C in the last 72 hours. CBG: Recent Labs  Lab 04/06/21 1717 04/06/21 2234 04/07/21 0735 04/07/21 1131 04/07/21 1641  GLUCAP 230* 183* 101* 200* 250*    Lipid Profile: No results for input(s): CHOL, HDL, LDLCALC, TRIG, CHOLHDL, LDLDIRECT in the last 72 hours. Thyroid Function Tests: No results for input(s): TSH, T4TOTAL, FREET4, T3FREE, THYROIDAB in the last 72 hours. Anemia Panel: No results for input(s): VITAMINB12, FOLATE, FERRITIN, TIBC, IRON, RETICCTPCT in the last 72 hours. Sepsis Labs: Recent Labs  Lab 04/02/21 0802 04/02/21 1102  LATICACIDVEN 1.8 1.4     Recent Results (from the past 240 hour(s))  Culture, blood (routine x 2)     Status: None (Preliminary result)   Collection Time: 04/02/21  8:02 AM   Specimen: BLOOD  Result Value Ref Range Status   Specimen Description   Final    BLOOD BLOOD LEFT FOREARM Performed at [facility] [address] [place], [place] [address]    Special Requests   Final    BOTTLES DRAWN AEROBIC AND ANAEROBIC Blood Culture adequate volume Performed at [facility] [address], [place], [place] [address]    Culture   Final    NO GROWTH 3 DAYS Performed at [facility], [place] [address], [place], [place] [address]    Report Status PENDING  Incomplete  Culture, blood (routine x 2)     Status: None (Preliminary result)   Collection Time: 04/02/21  8:02 AM   Specimen: BLOOD  Result Value Ref Range Status   Specimen Description   Final    BLOOD RIGHT ANTECUBITAL Performed at [facility] [address] [place], [place] [address]    Special Requests   Final    BOTTLES DRAWN AEROBIC AND ANAEROBIC Blood Culture adequate volume Performed at [facility] [address] [place], [place] [address]    Culture   Final    NO GROWTH 3 DAYS Performed at [facility], [place] [address], [place], [place] [address]    Report Status PENDING  Incomplete  MRSA Next Gen by PCR, Nasal     Status: None   Collection Time: 04/02/21  8:04 AM   Specimen: Nasal Mucosa; Nasal Swab  Result Value Ref Range Status   MRSA by PCR Next Gen NOT DETECTED NOT DETECTED Final    Comment: (NOTE) The GeneXpert MRSA Assay (FDA approved for NASAL specimens only), is one component of a comprehensive MRSA colonization surveillance program. It is not intended to diagnose MRSA infection nor to guide or monitor treatment for MRSA infections. Test performance is not FDA approved in patients less than 34 years old. Performed at [facility], [place] [address] [place],  [address]   Expectorated Sputum Assessment w Gram Stain, Rflx to Resp  Cult     Status: None   Collection Time: 04/02/21 10:27 AM   Specimen: Sputum  Result Value Ref Range Status   Specimen Description SPUTUM  Final   Special Requests NONE  Final   Sputum evaluation   Final    THIS SPECIMEN IS ACCEPTABLE FOR SPUTUM CULTURE Performed at [facility], [place] [address], [place], [place] [address]    Report Status 04/03/2021 FINAL  Final  Culture, Respiratory w Gram Stain     Status: None   Collection Time: 04/02/21 10:27 AM   Specimen: SPU  Result Value Ref  Range Status   Specimen Description   Final    SPUTUM Performed at [facility] [address] [place], [place] [address]    Special Requests   Final    NONE Reflexed from [id number] Performed at [facility] [address] [place], [place] [address]    Gram Stain   Final    RARE WBC PRESENT,BOTH PMN AND MONONUCLEAR RARE GRAM POSITIVE COCCI IN CLUSTERS RARE GRAM NEGATIVE COCCOBACILLI    Culture   Final    MODERATE Normal respiratory flora-no Staph aureus or Pseudomonas seen Performed at [facility], [address], [place], [place] [address]    Report Status 04/05/2021 FINAL  Final  Body fluid culture w Gram Stain     Status: None (Preliminary result)   Collection Time: 04/04/21  4:20 PM   Specimen: Pleural Fluid  Result Value Ref Range Status   Specimen Description   Final    PLEURAL Performed at [facility] [address], [place], [place] [address]    Special Requests   Final    NONE Performed at [facility], [place] [address], [place], [place] [address]    Gram Stain   Final    RARE WBC PRESENT, PREDOMINANTLY MONONUCLEAR NO ORGANISMS SEEN    Culture   Final    NO GROWTH 3 DAYS Performed at [facility], [place] [address] [place], [place] [address]    Report Status PENDING  Incomplete      Radiology Studies: DG Chest Port 1 View  Result Date: 04/07/2021 CLINICAL DATA:  Right-sided pleural effusion EXAM: PORTABLE CHEST 1 VIEW COMPARISON:  Yesterday FINDINGS: Midline trachea. Right-sided pigtail catheter again projects over the right lung apex with similar opacification. Mild right hemidiaphragm elevation. No well-defined pneumothorax. Clear left lung. Normal heart size. IMPRESSION: Similar appearance of the chest with pigtail catheter projecting over an area of opacified right lung apex. No new findings. Electronically Signed   By: Abigail Miyamoto M.D.   On: 04/07/2021 11:46   DG  CHEST PORT 1 VIEW  Result Date: 04/06/2021 CLINICAL DATA:  Cough EXAM: PORTABLE CHEST 1 VIEW COMPARISON:  04/05/2021 FINDINGS: Slight interval increase in fluid opacification of the right pulmonary apex, with a pigtail drainage catheter remaining in position. Left lung is normally aerated. Heart and mediastinum are unremarkable. IMPRESSION: Slight interval increase in fluid opacification of the right pulmonary apex, with a pigtail drainage catheter remaining in position. Left lung is normally aerated. Electronically Signed   By: Eddie Candle M.D.   On: 04/06/2021 12:54    Scheduled Meds:  (feeding supplement) PROSource Plus  30 mL Oral BID BM   amoxicillin-clavulanate  1 tablet Oral Q12H   benzonatate  200 mg Oral BID   celecoxib  200 mg Oral BID   Chlorhexidine Gluconate Cloth  6 each Topical  Daily   diazepam  1 mg Oral Q8H   feeding supplement  237 mL Oral BID BM   fentaNYL  1 patch Transdermal Q72H   ferrous sulfate  325 mg Oral Q breakfast   furosemide  40 mg Intravenous Once   gabapentin  300 mg Oral BID WC   gabapentin  600 mg Oral QHS   guaiFENesin  600 mg Oral BID   insulin aspart  0-15 Units Subcutaneous TID WC   insulin aspart  0-5 Units Subcutaneous QHS   insulin aspart  4 Units Subcutaneous TID WC   insulin glargine  10 Units Subcutaneous BID   irbesartan  75 mg Oral Daily   latanoprost  1 drop Both Eyes QHS   levalbuterol  0.63 mg Nebulization Q6H   lidocaine  1 patch Transdermal Q24H   multivitamin with minerals  1 tablet Oral Daily   pantoprazole  40 mg Oral Daily   predniSONE  20 mg Oral Q breakfast   QUEtiapine  100 mg Oral QHS   sertraline  100 mg Oral Daily   sodium chloride flush  3 mL Intravenous Q12H   Continuous Infusions:  tranexamic acid       LOS: 19 days   Marylu Lund, MD [facility] Pager On Amion  If 7PM-7AM, please contact night-coverage 04/07/2021, 5:41 PM

## 2021-04-07 NOTE — Progress Notes (Signed)
19 Days Post-Op Procedure(s) (LRB): VIDEO BRONCHOSCOPY WITHOUT FLUORO (N/A) Subjective: Feels well, frustrated with LOS  Objective: Vital signs in last 24 hours: Temp:  [97.7 F (36.5 C)-98.3 F (36.8 C)] 98.3 F (36.8 C) (07/30 1224) Pulse Rate:  [89-98] 94 (07/30 1224) Cardiac Rhythm: Normal sinus rhythm (07/30 0700) Resp:  [16-18] 18 (07/30 1224) BP: (117-136)/(71-79) 117/71 (07/30 1224) SpO2:  [87 %-99 %] 98 % (07/30 1224)  Hemodynamic parameters for last 24 hours:    Intake/Output from previous day: 07/29 0701 - 07/30 0700 In: 1160 [P.O.:720; I.V.:56; Blood:384] Out: 2135 [Urine:1875; Chest Tube:260] Intake/Output this shift: Total I/O In: 240 [P.O.:240] Out: 352 [Urine:350; Stool:2]  General appearance: alert and cooperative Heart: regular rate and rhythm Lungs: diminished breath sounds right apex and base Serosanguinous drainage from CT  Lab Results: Recent Labs    04/06/21 0419 04/06/21 1944 04/07/21 0431  WBC 9.6  --  9.8  HGB 6.9* 8.9* 8.1*  HCT 22.0* 28.8* 26.3*  PLT 246  --  267   BMET:  Recent Labs    04/06/21 0419 04/07/21 0431  NA 139 139  K 3.8 3.9  CL 102 99  CO2 31 32  GLUCOSE 126* 110*  BUN 11 11  CREATININE 0.58* 0.53*  CALCIUM 8.5* 9.1    PT/INR: No results for input(s): LABPROT, INR in the last 72 hours. ABG    Component Value Date/Time   PHART 7.458 (H) 02/26/2021 0630   HCO3 28.7 (H) 02/26/2021 0630   TCO2 30 02/26/2021 0630   ACIDBASEDEF 1.3 01/18/2021 1504   O2SAT 96.0 02/26/2021 0630   CBG (last 3)  Recent Labs    04/06/21 2234 04/07/21 0735 04/07/21 1131  GLUCAP 183* 101* 200*    Assessment/Plan: S/P Procedure(s) (LRB): VIDEO BRONCHOSCOPY WITHOUT FLUORO (N/A) CXR unchanged, CT drainage tapering off, still has some loculated fluid Remains afebrile and WBC normal Cultures from pleural fluid were negative Will place chest drain to water seal, with plan to have it removed tomorrow Would complete a week of IV  antibiotics then keep on PO for at least 2-3 more weeks after that   LOS: 19 days    [name] 04/07/2021

## 2021-04-08 ENCOUNTER — Inpatient Hospital Stay (HOSPITAL_COMMUNITY): Payer: 59

## 2021-04-08 DIAGNOSIS — J9621 Acute and chronic respiratory failure with hypoxia: Secondary | ICD-10-CM | POA: Diagnosis not present

## 2021-04-08 DIAGNOSIS — R0489 Hemorrhage from other sites in respiratory passages: Secondary | ICD-10-CM | POA: Diagnosis not present

## 2021-04-08 DIAGNOSIS — R042 Hemoptysis: Secondary | ICD-10-CM

## 2021-04-08 DIAGNOSIS — I10 Essential (primary) hypertension: Secondary | ICD-10-CM | POA: Diagnosis not present

## 2021-04-08 LAB — GLUCOSE, CAPILLARY
Glucose-Capillary: 125 mg/dL — ABNORMAL HIGH (ref 70–99)
Glucose-Capillary: 110 mg/dL — ABNORMAL HIGH (ref 70–99)
Glucose-Capillary: 198 mg/dL — ABNORMAL HIGH (ref 70–99)
Glucose-Capillary: 162 mg/dL — ABNORMAL HIGH (ref 70–99)

## 2021-04-08 LAB — CBC
HCT: 27.4 % — ABNORMAL LOW (ref 39.0–52.0)
Hemoglobin: 8.4 g/dL — ABNORMAL LOW (ref 13.0–17.0)
MCH: 29.9 pg (ref 26.0–34.0)
MCHC: 30.7 g/dL (ref 30.0–36.0)
MCV: 97.5 fL (ref 80.0–100.0)
Platelets: 290 10*3/uL (ref 150–400)
RBC: 2.81 MIL/uL — ABNORMAL LOW (ref 4.22–5.81)
RDW: 15.2 % (ref 11.5–15.5)
WBC: 11.4 10*3/uL — ABNORMAL HIGH (ref 4.0–10.5)
nRBC: 0 % (ref 0.0–0.2)

## 2021-04-08 LAB — BODY FLUID CULTURE W GRAM STAIN: Culture: NO GROWTH

## 2021-04-08 IMAGING — CT CT PELVIS W/O CM
2 of 6 series · 13 of 46 positions shown, 18 images · non-contrast
Comparison: PET CT [DATE]

CLINICAL DATA: Bilateral hip pain. Back pain. New onset pain
limiting ability to walk. History of lung cancer.

EXAM:
CT PELVIS WITHOUT CONTRAST
TECHNIQUE: Multidetector CT imaging of the pelvis was performed following the
standard protocol without intravenous contrast.

[Series 5: axial st · axial · 0.76mm/px · z∈[+825,+1059]mm · 10 of 135 slices shown, 15 images]
[im 9/135  soft-tissue]
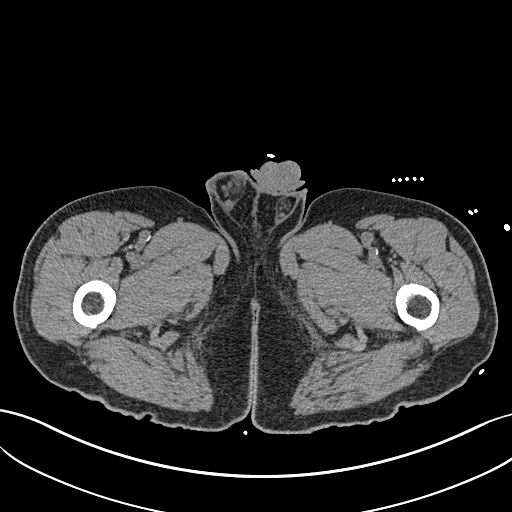
[im 9/135  bone]
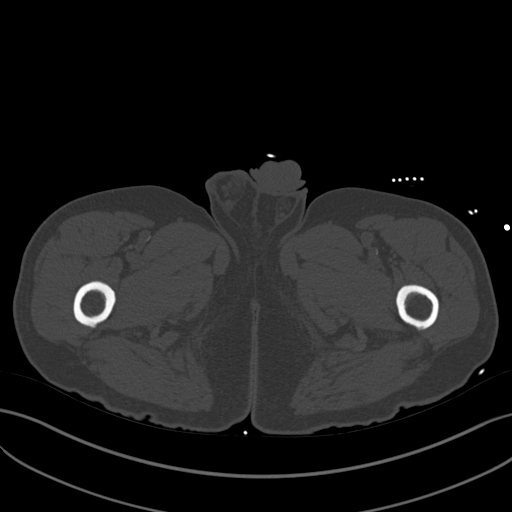
[im 27/135  soft-tissue]
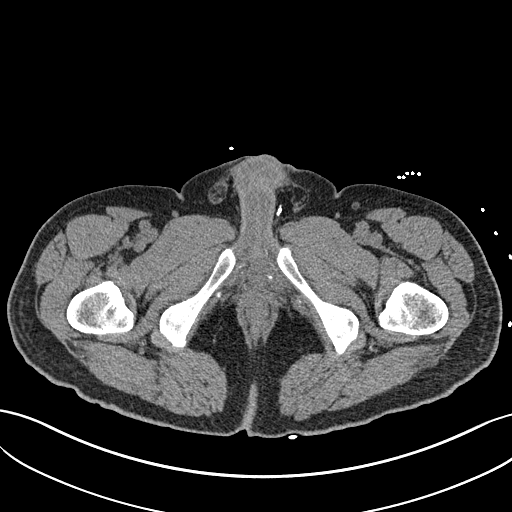
[im 36/135  soft-tissue]
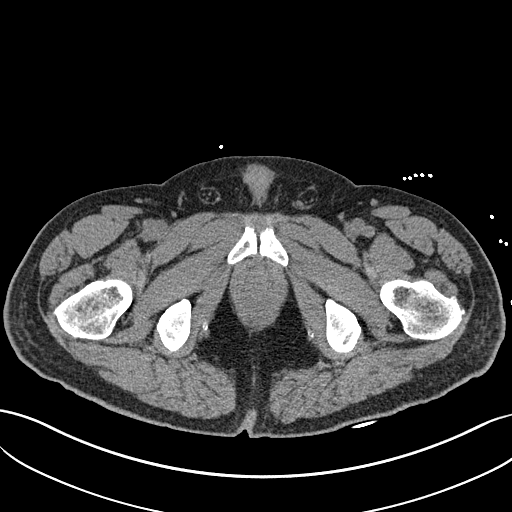
[im 54/135  soft-tissue]
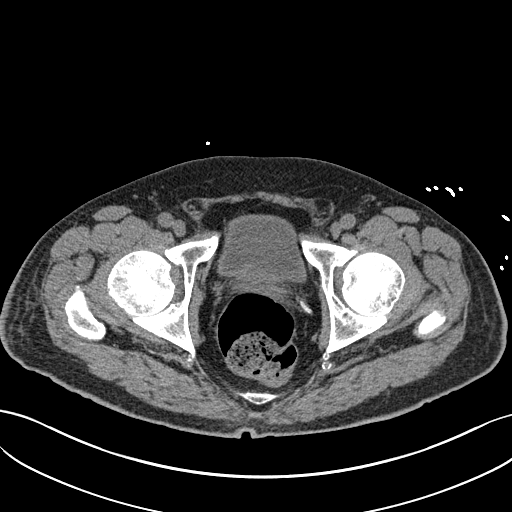
[im 72/135  soft-tissue]
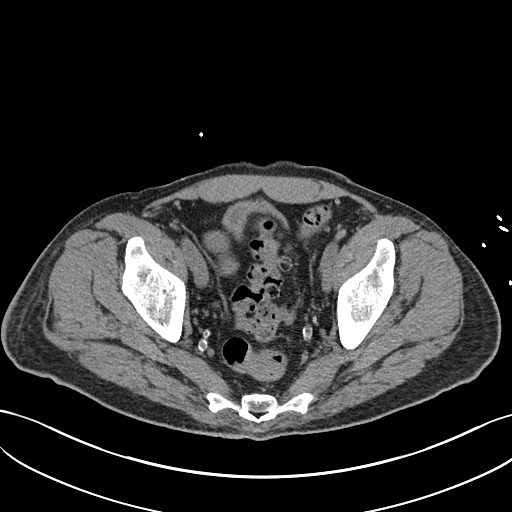
[im 81/135  soft-tissue]
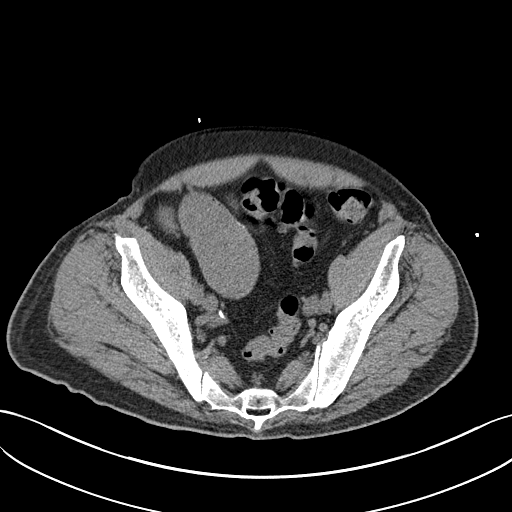
[im 99/135  soft-tissue]
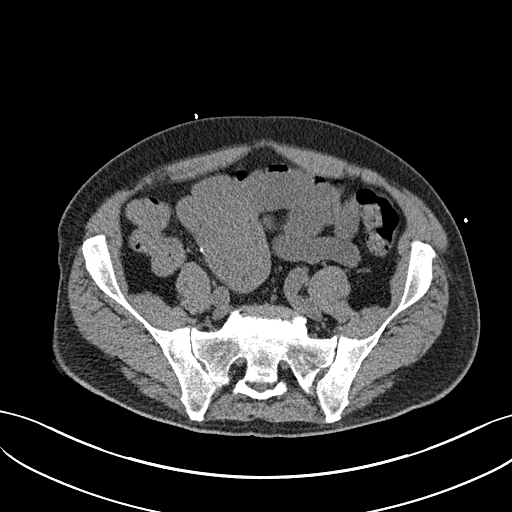
[im 99/135  lung]
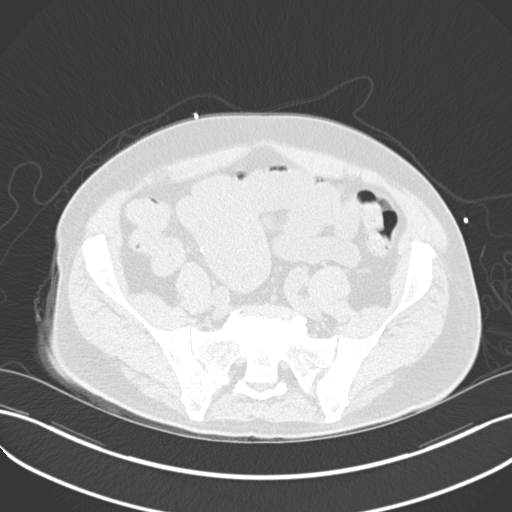
[im 108/135  soft-tissue]
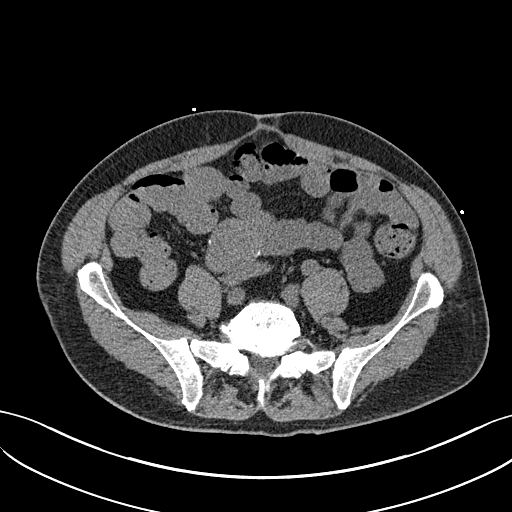
[im 108/135  lung]
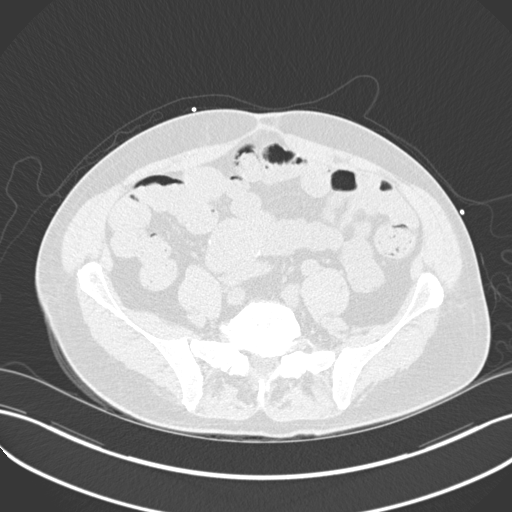
[im 117/135  lung]
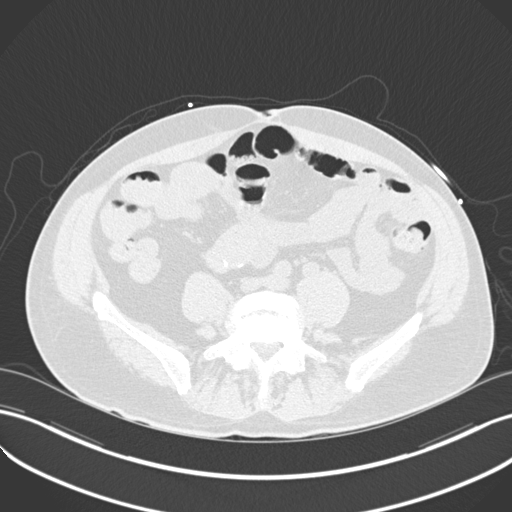
[im 126/135  soft-tissue]
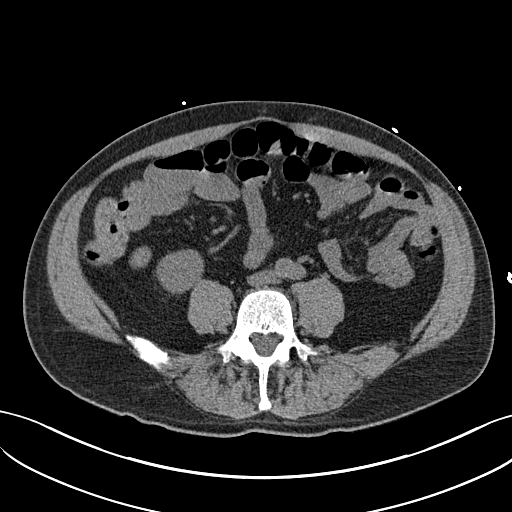
[im 126/135  lung]
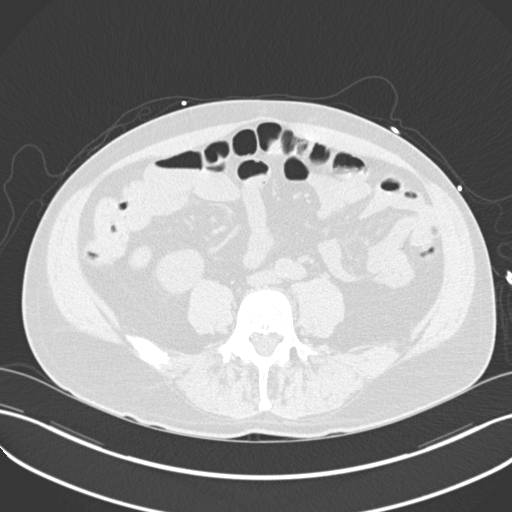
[im 126/135  bone]
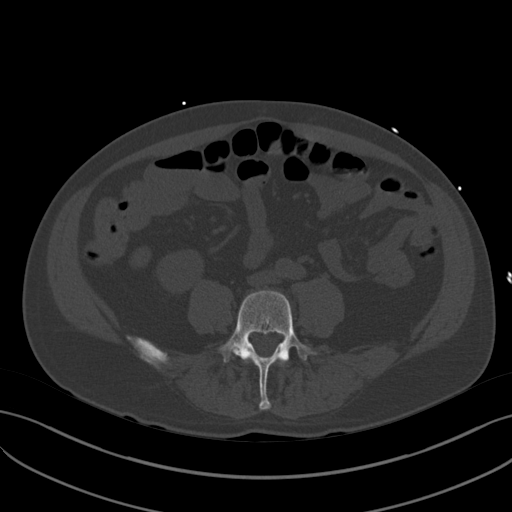

[Series 8: coronal st · coronal · 0.58mm/px · 3 of 142 slices shown]
[im 36/142  soft-tissue]
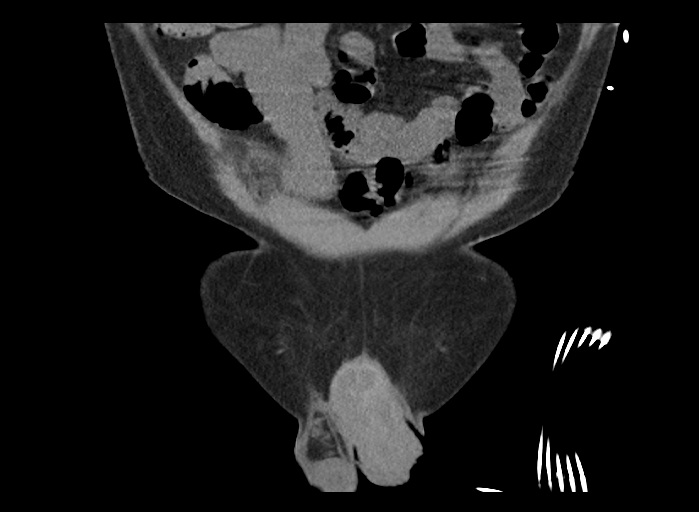
[im 71/142  soft-tissue]
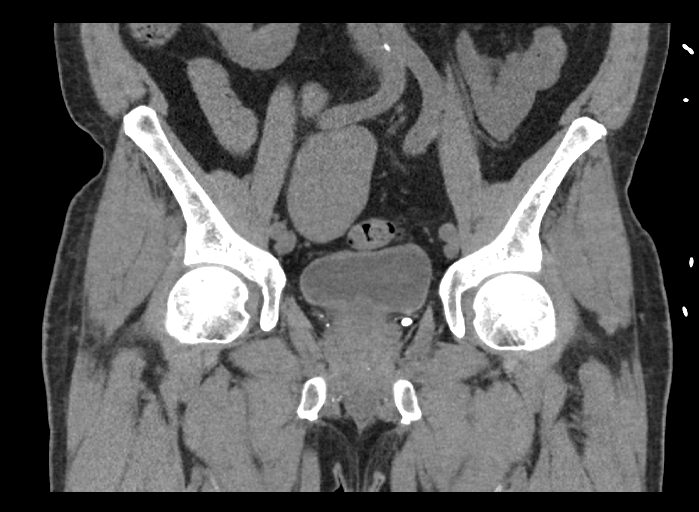
[im 106/142  soft-tissue]
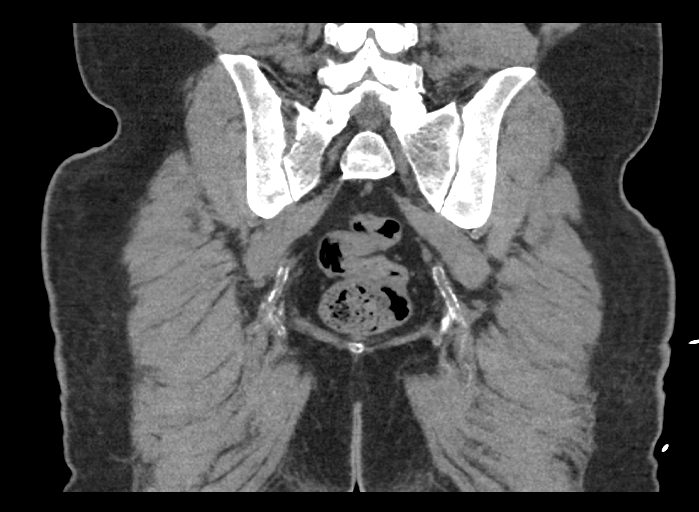

[13 of 46 positions shown; findings below may reference images not displayed]

FINDINGS: Urinary Tract: Distal ureters are decompressed. Unremarkable urinary
bladder.

Bowel: Moderate stool in the included colon. No bowel inflammation.

Vascular/Lymphatic: Iliac atherosclerosis.  No pelvic adenopathy.

Reproductive:  Unremarkable prostate gland.

Other:  No pelvic free fluid.

Musculoskeletal: No blastic or destructive lytic lesions. No
evidence of fracture. Both femoral heads are seated in the
acetabulum with preservation of joint spaces. No visualized
avascular necrosis. No periosteal reaction or bony destruction. No
hip joint effusions. Minimal degenerative change of both sacroiliac
joints. Lumbar spine assessed on concurrent lumbar spine CT. There
are no intramuscular findings to suggest metastatic implants are
disease.
IMPRESSION: No findings of osseous metastatic disease on CT. No acute
explanation for hip pain. If there is persistent clinical concern
for bony metastasis, recommend further assessment with MRI or bone
scan.

## 2021-04-08 IMAGING — CT CT L SPINE W/O CM
3 of 5 series · 9 of 33 positions shown, 10 images · non-contrast
Comparison: None available.

CLINICAL DATA: Initial evaluation for low back pain. History of
lung cancer.

EXAM:
CT LUMBAR SPINE WITHOUT CONTRAST
TECHNIQUE: Multidetector CT imaging of the lumbar spine was performed without
intravenous contrast administration. Multiplanar CT image
reconstructions were also generated.

[Series 3: l spine soft · axial · 0.31mm/px · z∈[+1048,+1238]mm · 3 of 155 slices shown, 4 images]
[im 36/155  soft-tissue]
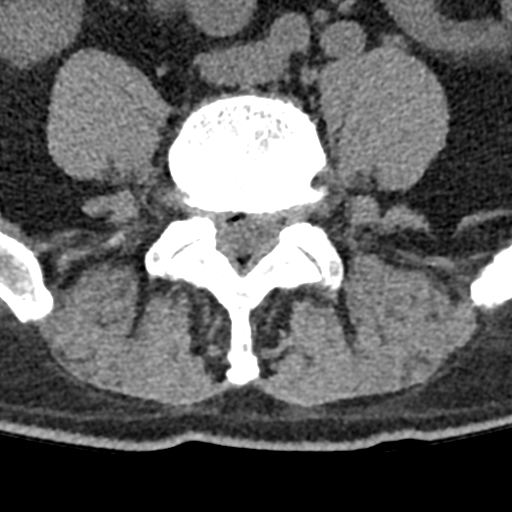
[im 36/155  bone]
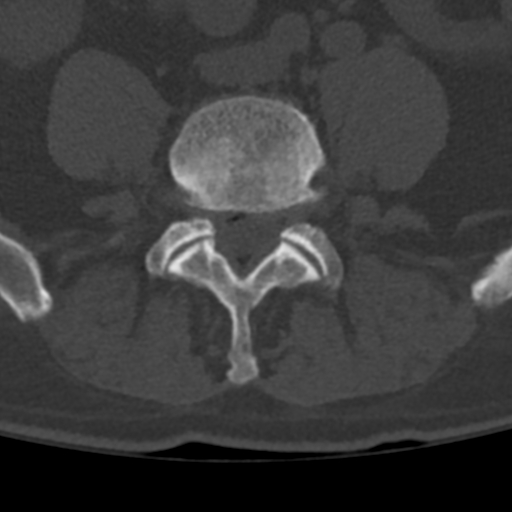
[im 83/155  bone]
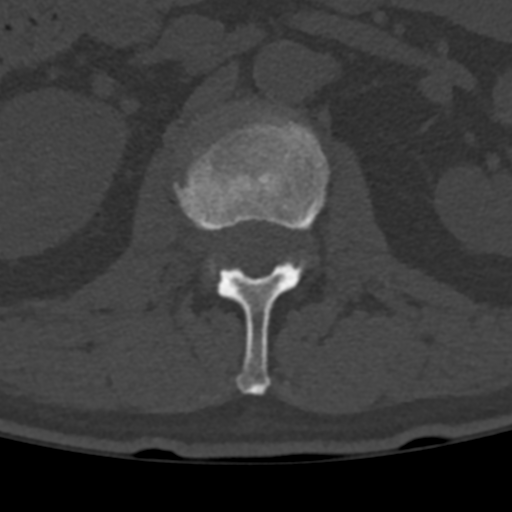
[im 131/155  bone]
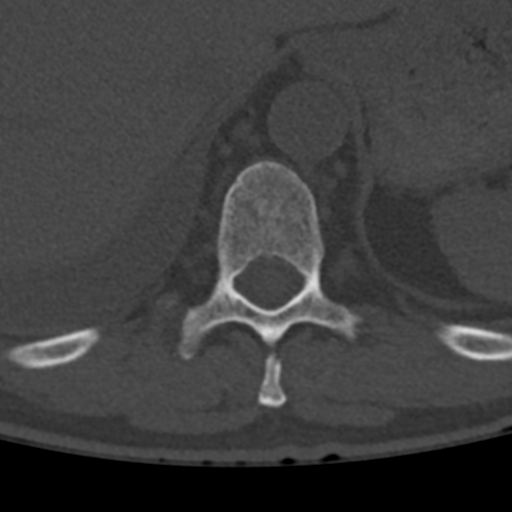

[Series 5: sagittal bone · sagittal · 0.34mm/px · 3 of 63 slices shown]
[im 16/63  bone]
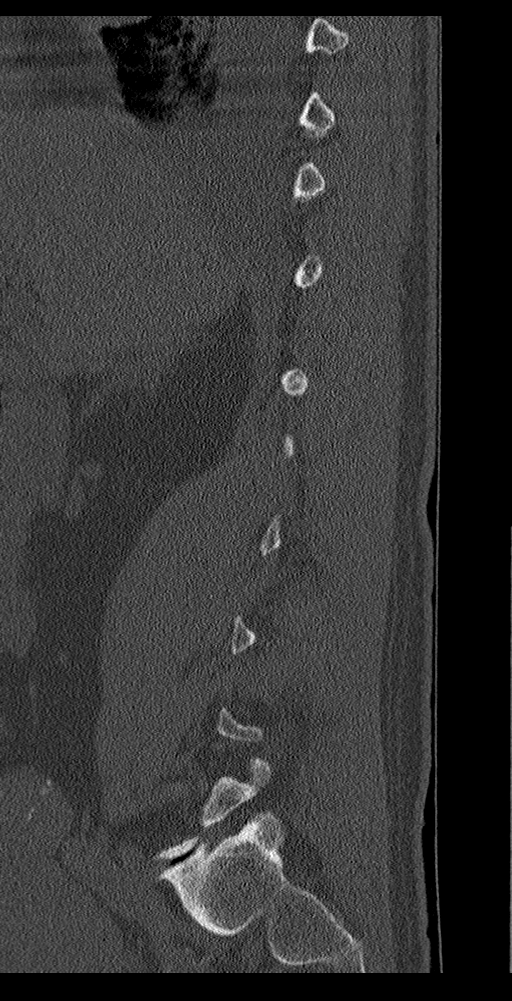
[im 32/63  bone]
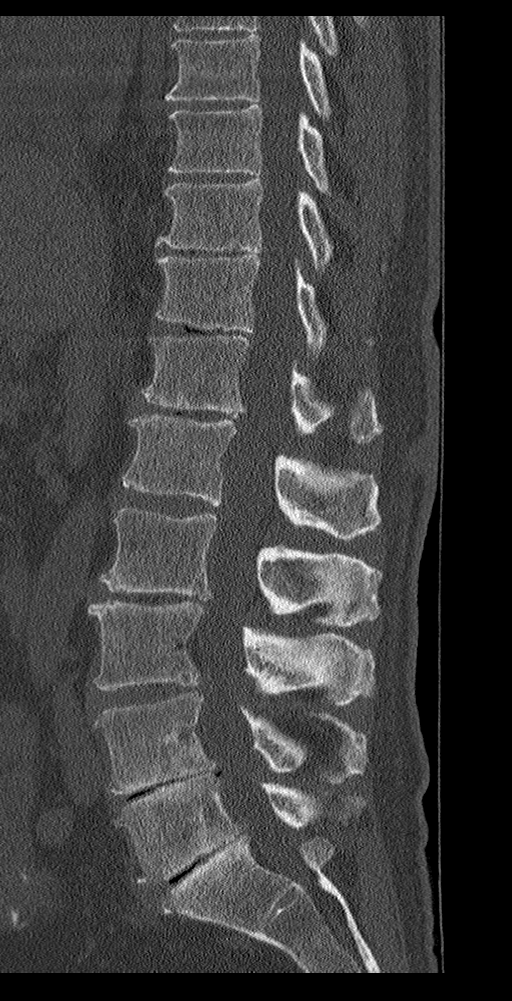
[im 47/63  bone]
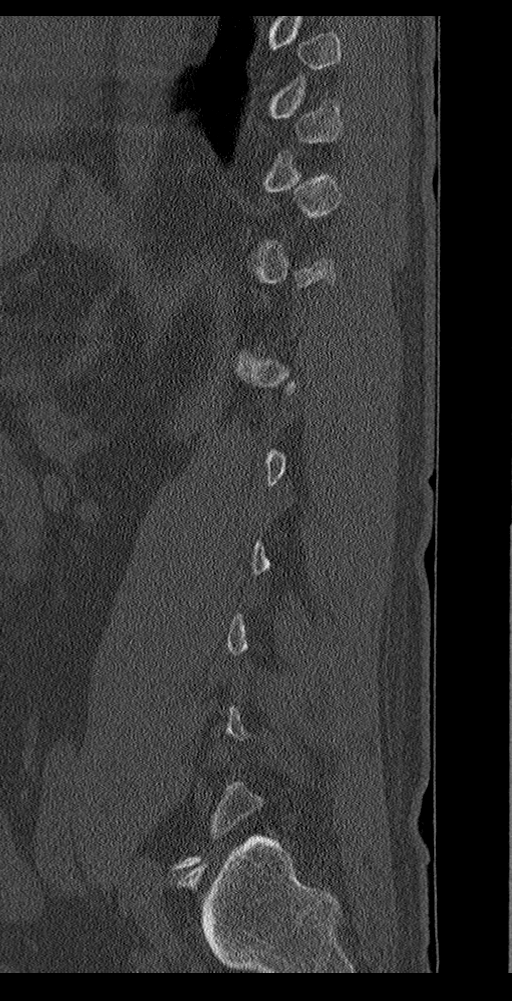

[Series 6: coronal bone · coronal · 0.24mm/px · 3 of 86 slices shown]
[im 18/86  bone]
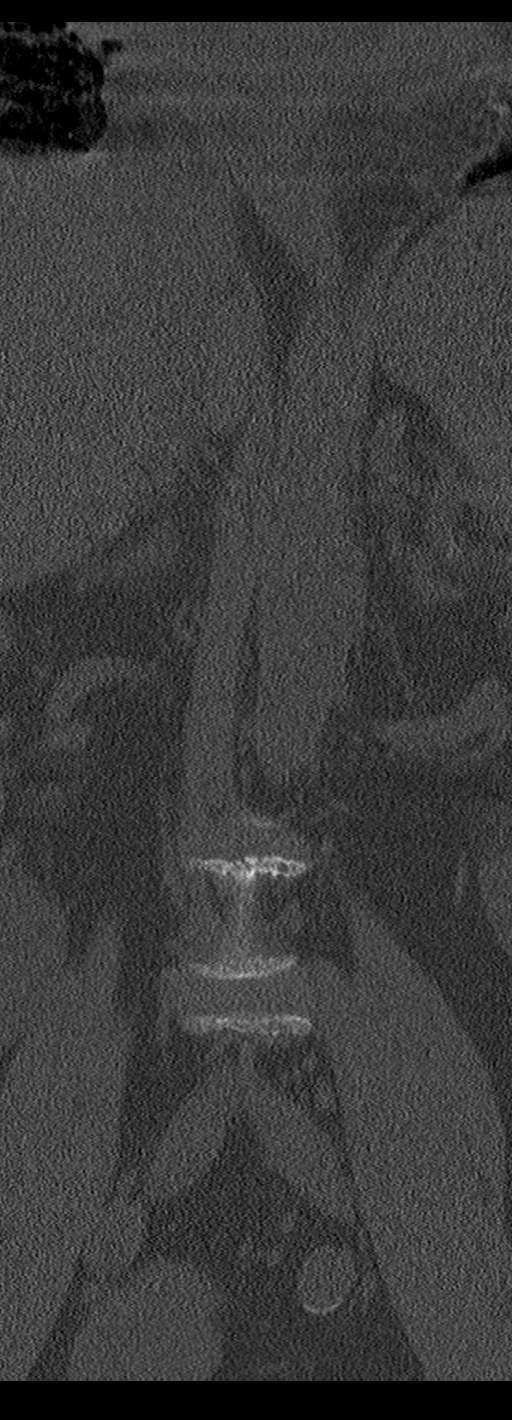
[im 35/86  bone]
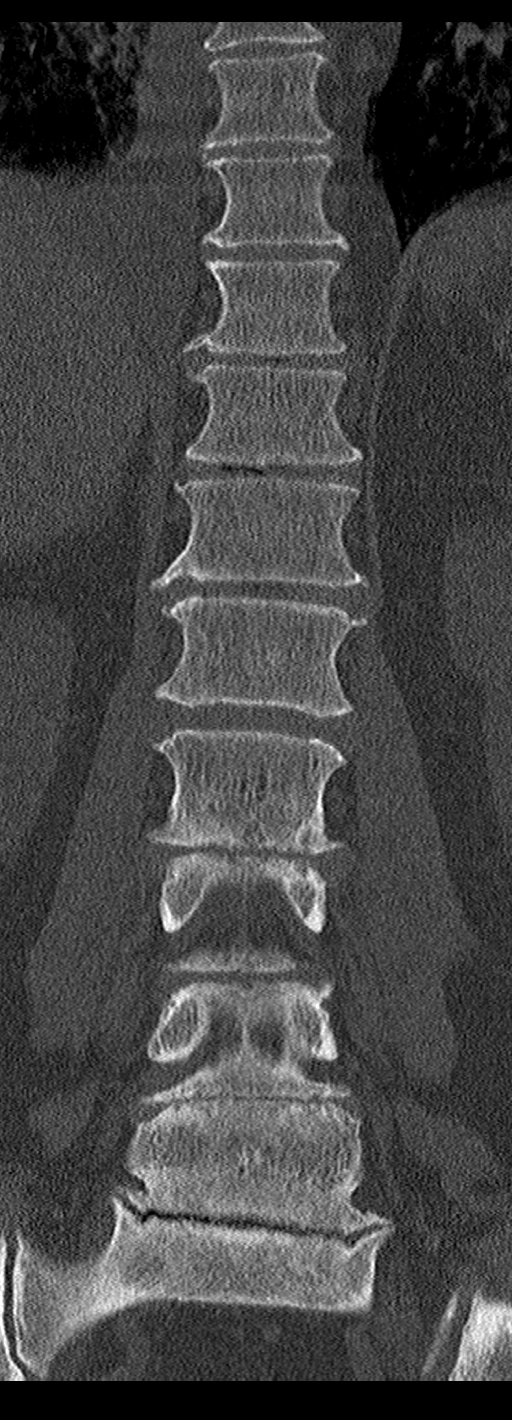
[im 52/86  bone]
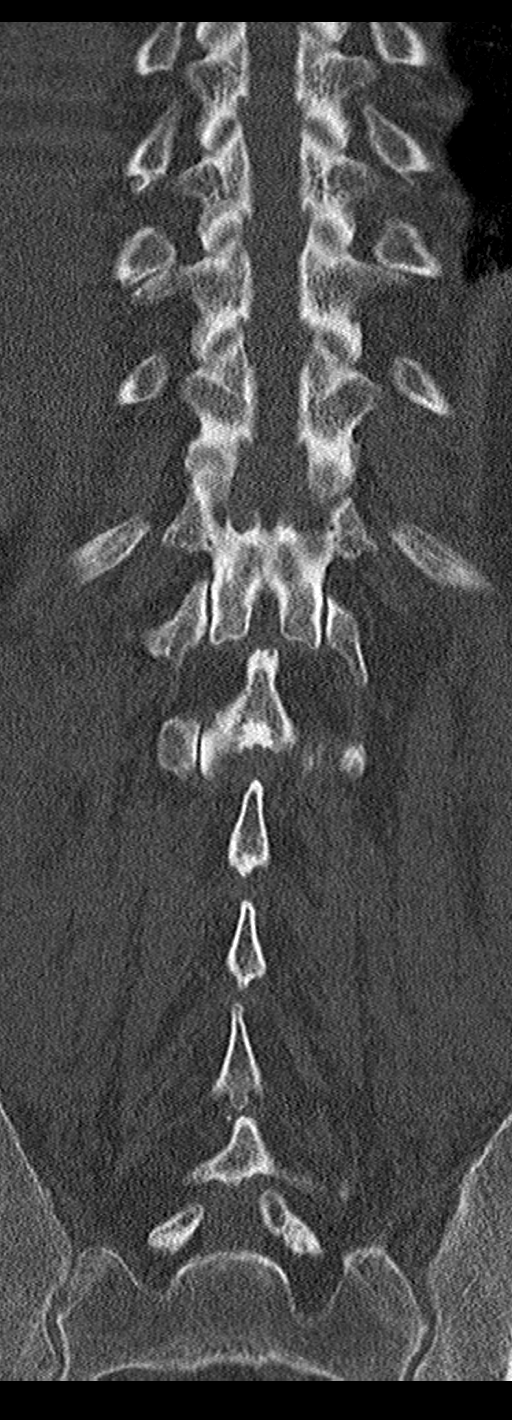

[9 of 33 positions shown; findings below may reference images not displayed]

FINDINGS: Segmentation: Standard. Lowest well-formed disc space labeled the
L5-S1 level.

Alignment: Trace retrolisthesis of T12 on L1 and L2 on L3.
Underlying minimal sigmoid scoliotic curvature.

Vertebrae: Vertebral body height maintained without acute or chronic
fracture. Visualized sacrum and pelvis intact. SI joints symmetric
and normal. No discrete lytic or blastic osseous lesions to suggest
osseous metastatic disease evident by CT. Subcentimeter sclerotic
lesion at the posterior right iliac wing noted, most characteristic
of a small benign bone island (series 2, image 134).

Paraspinal and other soft tissues: Paraspinous soft tissues
demonstrate no acute finding. 1.8 cm well-circumscribed hypodense
lesion within the subcutaneous fat along the midline of the mid back
at the level of T12-L1 most characteristic of a small sebaceous
cyst/epidermal inclusion cyst (series 3, image 65). No other
discrete soft tissue lesions. Small to moderate layering right
pleural effusion partially visualized. Postoperative changes noted
about the partially visualized colon. Mild aorto bi-iliac
atherosclerotic disease. Visualized visceral structures otherwise
unremarkable.

Disc levels:

T12-L1: Trace retrolisthesis. Degenerative intervertebral disc space
narrowing with disc desiccation and mild disc bulge. Superimposed
right extraforaminal disc protrusion (series 3, image 60). No spinal
stenosis. Mild right foraminal narrowing. Left neural foramina
remains patent.

L1-2: Degenerative intervertebral disc space narrowing with diffuse
disc bulge, eccentric to the right. Mild facet hypertrophy. No
significant spinal stenosis. Moderate right worse than left L1
foraminal narrowing.

L2-3: Degenerative intervertebral disc space narrowing with disc
desiccation and diffuse disc bulge. Mild reactive endplate spurring.
Mild facet hypertrophy. No spinal stenosis. Mild left L2 foraminal
narrowing. Right neural foramen remains patent.

L3-4: Degenerative intervertebral disc space narrowing with diffuse
disc bulge and mild disc desiccation. Minimal reactive endplate
spurring. Mild facet hypertrophy. Resultant mild spinal stenosis.
Mild bilateral L3 foraminal narrowing.

L4-5: Degenerative intervertebral disc space narrowing with diffuse
disc bulge and disc desiccation. Associated reactive endplate change
with marginal endplate osteophytic spurring. Suspected superimposed
small right foraminal disc protrusion (series 3, image 120). Mild to
moderate facet hypertrophy. Mild canal with bilateral lateral recess
stenosis. Moderate right worse than left L4 foraminal narrowing.

L5-S1: Degenerative intervertebral disc space narrowing with diffuse
disc bulge and disc desiccation. Associated reactive endplate
spurring. Mild to moderate right worse than left facet hypertrophy.
No significant spinal stenosis. Moderate bilateral L5 foraminal
narrowing, right worse than left.
IMPRESSION: 1. No acute abnormality within the lumbar spine. No CT evidence for
osseous metastatic disease identified. If there is persistent
clinical concern for possible occult metastatic disease, further
assessment with dedicated MRI or bone scan would be recommended for
further evaluation.
2. Moderate multilevel degenerative spondylolysis. Resultant mild
spinal stenosis at L3-4 and L4-5, with mild-to-moderate multilevel
foraminal narrowing as above, most pronounced at L4 and L5
bilaterally.
3. Small to moderate layering right pleural effusion, partially
visualized.

## 2021-04-08 MED ORDER — DIAZEPAM 10 MG PO TABS: 5.0000 mg | ORAL_TABLET | Freq: Every day | ORAL | 3 refills | Status: AC | PRN

## 2021-04-08 MED ORDER — DIAZEPAM 2 MG PO TABS: 1.0000 mg | ORAL_TABLET | Freq: Three times a day (TID) | ORAL | 0 refills | Status: DC

## 2021-04-08 MED ORDER — CELECOXIB 200 MG PO CAPS: 200.0000 mg | ORAL_CAPSULE | Freq: Two times a day (BID) | ORAL | 3 refills | Status: AC

## 2021-04-08 MED ORDER — BENZONATATE 200 MG PO CAPS: 200.0000 mg | ORAL_CAPSULE | Freq: Two times a day (BID) | ORAL | 0 refills | Status: AC

## 2021-04-08 MED ORDER — FENTANYL 100 MCG/HR TD PT72: 1.0000 | MEDICATED_PATCH | TRANSDERMAL | 0 refills | Status: DC

## 2021-04-08 NOTE — Progress Notes (Signed)
20 Days Post-Op Procedure(s) (LRB): VIDEO BRONCHOSCOPY WITHOUT FLUORO (N/A) Subjective: Anxious to go home  Objective: Vital signs in last 24 hours: Temp:  [97.7 F (36.5 C)-98.2 F (36.8 C)] 98 F (36.7 C) (07/31 1435) Pulse Rate:  [100-107] 107 (07/31 0607) Cardiac Rhythm: Sinus tachycardia (07/31 0700) Resp:  [14-15] 14 (07/31 0607) BP: (112-139)/(65-79) 139/79 (07/31 0607) SpO2:  [97 %-99 %] 97 % (07/31 0607)  Hemodynamic parameters for last 24 hours:    Intake/Output from previous day: 07/30 0701 - 07/31 0700 In: 783 [P.O.:780; I.V.:3] Out: 662 [Urine:650; Stool:2; Chest Tube:10] Intake/Output this shift: Total I/O In: 120 [P.O.:120] Out: -   General appearance: alert, cooperative, and no distress Neurologic: intact No air leak  Lab Results: Recent Labs    04/07/21 0431 04/08/21 0444  WBC 9.8 11.4*  HGB 8.1* 8.4*  HCT 26.3* 27.4*  PLT 267 290   BMET:  Recent Labs    04/06/21 0419 04/07/21 0431  NA 139 139  K 3.8 3.9  CL 102 99  CO2 31 32  GLUCOSE 126* 110*  BUN 11 11  CREATININE 0.58* 0.53*  CALCIUM 8.5* 9.1    PT/INR: No results for input(s): LABPROT, INR in the last 72 hours. ABG    Component Value Date/Time   PHART 7.458 (H) 02/26/2021 0630   HCO3 28.7 (H) 02/26/2021 0630   TCO2 30 02/26/2021 0630   ACIDBASEDEF 1.3 01/18/2021 1504   O2SAT 96.0 02/26/2021 0630   CBG (last 3)  Recent Labs    04/07/21 2023 04/08/21 0755 04/08/21 1118  GLUCAP 183* 125* 110*    Assessment/Plan: S/P Procedure(s) (LRB): VIDEO BRONCHOSCOPY WITHOUT FLUORO (N/A) Feels well  Remains afebrile on PO Augmentin Minimal output from CT- IR to remove in AM [facility] for dc from our standpoint, but would continue Augementin at least 2 more weeks as an outpatient   LOS: 20 days    [name] 04/08/2021

## 2021-04-08 NOTE — Progress Notes (Addendum)
PROGRESS NOTE    Christopher Carter:[id number] DOB: 08-Nov-1960 DOA: 03/18/2021 PCP: [name], MD    Brief Narrative:  60 year old M with PMH of stage IV adenocarcinoma s/p RUL lobectomy with node dissection on 5/16, recurrent hemoptysis, chronic RF on 5 L, tobacco abuse, HTN, depression, loculated hemithorax treated with IR chest tube placement, empiric antibiotics and prednisone taper and discharged to follow-up with CTS and radiation oncology.  He returned with cough with hemoptysis, right upper back pain, and increased oxygen requirement.  CT chest showed airspace disease in right lung representing alveolar hemorrhage with recurrence of apical fluid collection, nodular masslike lesion concerning for recurrence of malignancy and right peritracheal adenopathy.  He was admitted to [facility].  CTS, pulmonary IR and palliative consulted.  Started on IV cefepime, systemic steroid and tranexamic acid nebulization.  He underwent bronchoscopy that did not reveal active bleeding. Started on PCA Dilaudid by PMT for pain control, and transferred to [facility] [place] for radiation oncology care.    Patient's hemoptysis resolved, oxygen requirement improved, and made good progression until 7/24 when he had small-volume hemoptysis on tissue papers.  Then, he spiked fever, tachycardia, tachypnea with increased oxygen requirement the early morning of 7/25.  CTA chest negative for large central PE but with new gas in complex right apical collection concerning for communication with the airways, adjacent increase in bilateral airspace disease.  Cultures obtained.  Started on broad-spectrum antibiotics with Vanco and Zosyn.  Transferred to stepdown unit.  CTS consulted and recommended drain placement by IR.    Assessment & Plan:   Principal Problem:   Intra-alveolar hemorrhage Active Problems:   Essential hypertension   Acute on chronic respiratory failure with hypoxia (HCC)   Malignant neoplasm of right upper  lobe of lung (HCC)   Major depressive disorder   Uncontrolled pain   Leukocytosis   Normocytic anemia   Tooth abscess   Thrombocytosis   Palliative care by specialist   Palliative care encounter   Abscess of upper lobe of right lung with pneumonia ([place])  Intra-alveolar hemorrhage in patient with history of recurrent hemoptysis-likely due to lung cancer. 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 s/p RUL lobectomy and node dissection by Dr. Koleen Nimrod on 5/16 Acute on chronic respiratory failure with hypoxia-on NRB.  CXR with new LLL PNA.  High risk for VTE. Severe sepsis: Febrile, tachycardic, tachypnea with acute on chronic hypoxic respiratory failure -7/10-CTA chest concerning for alveolar hemorrhage, loculated fluid at right lung apex lined by masslike nodularity consistent with recurrence of malignancy and right peritracheal LAD -7/11-bronchoscopy on 7/11-no active hemorrhage -7/12-respiratory culture with normal respiratory flora -7/13-Rad/onc-started radiation.  Planned for 6 and half week -7/18-completed 7 days of IV cefepime. -7/19-repeat CTA chest without significant change, may be improved hemorrhage -7/21-started on azithromycin.   -7/23-started on Augmentin for dental abscess -7/24-prednisone resumed at 20 mg daily due to recurrence of hemoptysis -7/25-fever, increased O2 requirement, tachycardia and tachypnea.  CXR with new LLL infiltrate.  CTA chest negative for large central PE but with new gas in complex right apical collection concerning for communication with the airways, adjacent increase in bilateral airspace disease.  CTS consulted and recommended IR drain Blood and sputum cultures obtained and started on vancomycin and Zosyn.   -7/26-significant improvement.  Blood culture and MRSA PCR negative thus Vancomycin discontinued.  Drain was placed by IR -Per CTS, plan to remove chest tube 8/1. OK to d/c from CTS service afterwards    Uncontrolled  cancer-related pain: Rates his  pain 6/10 today -Palliative medicine managing -On fentanyl patch 100 mcg, p.o. morphine 7.5 mg PRN and IV fentanyl PRN -Celebrex and gabapentin. -On scheduled and as needed Valium for anxiety -Narcan as needed -comfortable laying in bed, conversant    Dental pain/possible infection: Was on penicillin V outpatient.  Plan for root canal Tx on 7/11 but postponed. 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  Hyperglycemia partly due to steroid. -Continue SSI moderate as needed -NovoLog 4 units 3 times daily with meals -Lantus 10 units twice daily    History of anxiety/depression: -On Seroquel, Zoloft and Valium   GERD: -Continue on Protonix   DVT prophylaxis: SCD's Code Status: Full Family Communication: Pt in room, family at bedside  Status is: Inpatient  Remains inpatient appropriate because:Inpatient level of care appropriate due to severity of illness  Dispo: The patient is from: Home              Anticipated d/c is to: Home              Patient currently is not medically stable to d/c.   Difficult to place patient No   Consultants:  PCCM Cardiothoracic surgery IR Palliative medicine Oncology-signed off Radiation oncology Dental surgery-signed off  Procedures:  Drain placed by IR 7/26  Antimicrobials: Anti-infectives (From admission, onward)    Start     Dose/Rate Route Frequency Ordered Stop   04/06/21 1030  amoxicillin-clavulanate (AUGMENTIN) 875-125 MG per tablet 1 tablet        1 tablet Oral Every 12 hours 04/06/21 0955  04/13/21 0959   04/02/21 2100  vancomycin (VANCOREADY) IVPB 1500 mg/300 mL  Status:  Discontinued        1,500 mg 150 mL/hr over 120 Minutes Intravenous Every 12 hours 04/02/21 0740 04/03/21 1115   04/02/21 0900  vancomycin (VANCOREADY) IVPB 1500 mg/300 mL        1,500 mg 150 mL/hr over 120 Minutes Intravenous  Once 04/02/21 0724 04/02/21 1300   04/02/21 0800  piperacillin-tazobactam (ZOSYN) IVPB 3.375 g  Status:  Discontinued        3.375 g 12.5 mL/hr over 240 Minutes Intravenous Every 8 hours 04/02/21 0724 04/06/21 0955   03/30/21 1130  amoxicillin-clavulanate (AUGMENTIN) 875-125 MG per tablet 1 tablet  Status:  Discontinued        1 tablet Oral Every 12 hours 03/30/21 1035 04/02/21 0716   03/29/21 1200  azithromycin (ZITHROMAX) tablet 250 mg  Status:  Discontinued       Note to Pharmacy: For dental infection   250 mg Oral Daily 03/29/21 1100 04/03/21 1115   03/19/21 0900  vancomycin (VANCOREADY) IVPB 1250 mg/250 mL  Status:  Discontinued        1,250 mg 166.7 mL/hr over 90 Minutes Intravenous Every 12 hours 03/18/21 1842 03/20/21 1059   03/18/21 2200  ceFEPIme (MAXIPIME) 2 g in sodium chloride 0.9 % 100 mL IVPB        2 g 200 mL/hr over 30 Minutes Intravenous Every 8 hours 03/18/21 1836 03/26/21 0835   03/18/21 1930  vancomycin (  VANCOREADY) IVPB 1500 mg/300 mL        1,500 mg 150 mL/hr over 120 Minutes Intravenous  Once 03/18/21 1836 03/19/21 0734   03/18/21 1915  azithromycin (ZITHROMAX) 500 mg in sodium chloride 0.9 % 250 mL IVPB  Status:  Discontinued        500 mg 250 mL/hr over 60 Minutes Intravenous Every 24 hours 03/18/21 1822 03/19/21 0840   03/18/21 1800  penicillin v potassium (VEETID) tablet 500 mg  Status:  Discontinued       Note to Pharmacy: Start date : 03/14/21     500 mg Oral 4 times daily 03/18/21 1527 03/18/21 1842       Subjective: Reports feeling better. Eager to go home soon  Objective: Vitals:   04/07/21 2052 04/08/21 0156 04/08/21 0607 04/08/21 1435   BP:   139/79   Pulse:   (!) 107   Resp:   14   Temp:   97.7 F (36.5 C) 98 F (36.7 C)  TempSrc:   Oral Oral  SpO2: 98% 98% 97%   Weight:      Height:        Intake/Output Summary (Last 24 hours) at 04/08/2021 1620 Last data filed at 04/08/2021 1012 Gross per 24 hour  Intake 123 ml  Output 310 ml  Net -187 ml    Filed Weights   03/18/21 0242 03/25/21 1542 04/02/21 1330  Weight: 79.4 kg 81.7 kg 82.4 kg    Examination: General exam: Awake, laying in bed, in nad Respiratory system: Normal respiratory effort, no wheezing, chest tube in place Cardiovascular system: regular rate, s1, s2 Gastrointestinal system: Soft, nondistended, positive BS Central nervous system: CN2-12 grossly intact, strength intact Extremities: Perfused, no clubbing Skin: Normal skin turgor, no notable skin lesions seen Psychiatry: Mood normal // no visual hallucinations   Data Reviewed: I have personally reviewed following labs and imaging studies  CBC: Recent Labs  Lab 04/02/21 0802 04/03/21 0230 04/04/21 0220 04/05/21 0241 04/06/21 0419 04/06/21 1944 04/07/21 0431 04/08/21 0444  WBC 14.0*   < > 8.8 10.6* 9.6  --  9.8 11.4*  NEUTROABS 12.6*  --  7.3  --   --   --   --   --   HGB 9.9*   < > 7.4* 7.6* 6.9* 8.9* 8.1* 8.4*  HCT 31.4*   < > 24.5* 25.2* 22.0* 28.8* 26.3* 27.4*  MCV 94.0   < > 96.8 98.1 96.5  --  97.0 97.5  PLT 296   < > 233 261 246  --  267 290   < > = values in this interval not displayed.    Basic Metabolic Panel: Recent Labs  Lab 04/02/21 0555 04/02/21 0850 04/03/21 0230 04/05/21 0241 04/06/21 0419 04/07/21 0431  NA 134*  --  137 138 139 139  K 5.5* 4.0 3.7 4.4 3.8 3.9  CL 96*  --  99 101 102 99  CO2 28  --  31 31 31  32  GLUCOSE 133*  --  154* 137* 126* 110*  BUN 16  --  17 13 11 11   CREATININE 0.69  --  0.51* 0.60* 0.58* 0.53*  CALCIUM 8.6*  --  8.4* 8.9 8.5* 9.1  MG  --   --  2.3  --   --   --   PHOS  --   --  3.2  --   --   --     GFR: Estimated  Creatinine Clearance: 103.9 mL/min (A) (by  C-G formula based on SCr of 0.53 mg/dL (L)). Liver Function Tests: Recent Labs  Lab 04/02/21 0555 04/05/21 0241 04/06/21 0419 04/07/21 0431  AST 46* 11* 10* 9*  ALT 31 19 18 18   ALKPHOS 86 64 61 60  BILITOT 1.3* 0.4 0.5 0.5  PROT 6.7 5.7* 5.3* 5.9*  ALBUMIN 2.7* 2.6* 2.4* 2.6*    No results for input(s): LIPASE, AMYLASE in the last 168 hours. No results for input(s): AMMONIA in the last 168 hours. Coagulation Profile: Recent Labs  Lab 04/03/21 0230  INR 1.2    Cardiac Enzymes: No results for input(s): CKTOTAL, CKMB, CKMBINDEX, TROPONINI in the last 168 hours. BNP (last 3 results) No results for input(s): PROBNP in the last 8760 hours. HbA1C: No results for input(s): HGBA1C in the last 72 hours. CBG: Recent Labs  Lab 04/07/21 1131 04/07/21 1641 04/07/21 2023 04/08/21 0755 04/08/21 1118  GLUCAP 200* 250* 183* 125* 110*    Lipid Profile: No results for input(s): CHOL, HDL, LDLCALC, TRIG, CHOLHDL, LDLDIRECT in the last 72 hours. Thyroid Function Tests: No results for input(s): TSH, T4TOTAL, FREET4, T3FREE, THYROIDAB in the last 72 hours. Anemia Panel: No results for input(s): VITAMINB12, FOLATE, FERRITIN, TIBC, IRON, RETICCTPCT in the last 72 hours. Sepsis Labs: Recent Labs  Lab 04/02/21 0802 04/02/21 1102  LATICACIDVEN 1.8 1.4     Recent Results (from the past 240 hour(s))  Culture, blood (routine x 2)     Status: None   Collection Time: 04/02/21  8:02 AM   Specimen: BLOOD  Result Value Ref Range Status   Specimen Description   Final    BLOOD BLOOD LEFT FOREARM Performed at [facility] [address], [place], [place] [address]    Special Requests   Final    BOTTLES DRAWN AEROBIC AND ANAEROBIC Blood Culture adequate volume Performed at [facility] [address] [place], [place] [address]    Culture   Final    NO GROWTH 5 DAYS Performed at [facility],  [place] [address], [place], [place] [address]    Report Status 04/07/2021 FINAL  Final  Culture, blood (routine x 2)     Status: None   Collection Time: 04/02/21  8:02 AM   Specimen: BLOOD  Result Value Ref Range Status   Specimen Description   Final    BLOOD RIGHT ANTECUBITAL Performed at [facility] [address], [place], [place] [address]    Special Requests   Final    BOTTLES DRAWN AEROBIC AND ANAEROBIC Blood Culture adequate volume Performed at [facility] [address], [place], [place] [address]    Culture   Final    NO GROWTH 5 DAYS Performed at [facility], [place] [address], [place], [place] [address]    Report Status 04/07/2021 FINAL  Final  MRSA Next Gen by PCR, Nasal     Status: None   Collection Time: 04/02/21  8:04 AM   Specimen: Nasal Mucosa; Nasal Swab  Result Value Ref Range Status   MRSA by PCR Next Gen NOT DETECTED NOT DETECTED Final    Comment: (NOTE) The GeneXpert MRSA Assay (FDA approved for NASAL specimens only), is one component of a comprehensive MRSA colonization surveillance program. It is not intended to diagnose MRSA infection nor to guide or monitor treatment for MRSA infections. Test performance is not FDA approved in patients less than 50 years old. Performed at [facility], [place]  [address], [place], [place] [address]   Expectorated Sputum Assessment w Gram Stain, Rflx to Resp Cult     Status: None   Collection Time: 04/02/21 10:27 AM   Specimen: Sputum  Result Value Ref Range Status   Specimen Description SPUTUM  Final   Special Requests NONE  Final   Sputum evaluation   Final    THIS SPECIMEN IS ACCEPTABLE FOR SPUTUM CULTURE Performed at [facility], [place] [address], [place], [place] [address]    Report Status 04/03/2021 FINAL  Final  Culture, Respiratory w Gram Stain     Status: None   Collection Time: 04/02/21 10:27 AM   Specimen: SPU  Result Value Ref  Range Status   Specimen Description   Final    SPUTUM Performed at [facility] [address] [place], [place] [address]    Special Requests   Final    NONE Reflexed from [id number] Performed at [facility] [address] [place], [place] [address]    Gram Stain   Final    RARE WBC PRESENT,BOTH PMN AND MONONUCLEAR RARE GRAM POSITIVE COCCI IN CLUSTERS RARE GRAM NEGATIVE COCCOBACILLI    Culture   Final    MODERATE Normal respiratory flora-no Staph aureus or Pseudomonas seen Performed at [facility], [address] [place], [place] [address]    Report Status 04/05/2021 FINAL  Final  Body fluid culture w Gram Stain     Status: None   Collection Time: 04/04/21  4:20 PM   Specimen: Pleural Fluid  Result Value Ref Range Status   Specimen Description   Final    PLEURAL Performed at [facility] [address], [place], [place] [address]    Special Requests   Final    NONE Performed at [facility], [place] [address], [place], [place] [address]    Gram Stain   Final    RARE WBC PRESENT, PREDOMINANTLY MONONUCLEAR NO ORGANISMS SEEN    Culture   Final    NO GROWTH 3 DAYS Performed at [facility], [place] [address] [place],  [address]    Report Status 04/08/2021 FINAL  Final      Radiology Studies: DG Chest Port 1 View  Result Date: 04/07/2021 CLINICAL DATA:  Right-sided pleural effusion EXAM: PORTABLE CHEST 1 VIEW COMPARISON:  Yesterday FINDINGS: Midline trachea. Right-sided pigtail catheter again projects over the right lung apex with similar opacification. Mild right hemidiaphragm elevation. No well-defined pneumothorax. Clear left lung. Normal heart size. IMPRESSION: Similar appearance of the chest with pigtail catheter projecting over an area of opacified right lung apex. No new findings. Electronically Signed   By: Abigail Miyamoto M.D.   On: 04/07/2021 11:46    Scheduled Meds:   (feeding supplement) PROSource Plus  30 mL Oral BID BM   amoxicillin-clavulanate  1 tablet Oral Q12H   benzonatate  200 mg Oral BID   celecoxib  200 mg Oral BID   Chlorhexidine Gluconate Cloth  6 each Topical Daily   diazepam  1 mg Oral Q8H   feeding supplement  237 mL Oral BID BM   fentaNYL  1 patch Transdermal Q72H   ferrous sulfate  325 mg Oral Q breakfast   furosemide  40 mg Intravenous Once   gabapentin  300 mg Oral BID WC   gabapentin  600 mg Oral QHS   guaiFENesin  600 mg Oral BID   insulin aspart  0-15 Units  Subcutaneous TID WC   insulin aspart  0-5 Units Subcutaneous QHS   insulin aspart  4 Units Subcutaneous TID WC   insulin glargine  10 Units Subcutaneous BID   irbesartan  75 mg Oral Daily   latanoprost  1 drop Both Eyes QHS   levalbuterol  0.63 mg Nebulization Q6H   lidocaine  1 patch Transdermal Q24H   multivitamin with minerals  1 tablet Oral Daily   pantoprazole  40 mg Oral Daily   predniSONE  20 mg Oral Q breakfast   QUEtiapine  100 mg Oral QHS   sertraline  100 mg Oral Daily   sodium chloride flush  3 mL Intravenous Q12H   Continuous Infusions:  tranexamic acid       LOS: 20 days   Marylu Lund, MD [facility] Pager On Amion  If 7PM-7AM, please contact night-coverage 04/08/2021, 4:20 PM

## 2021-04-08 NOTE — Progress Notes (Signed)
Referring Physician(s): Dr. 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 IR placed a chest tube on 7.27.22  Subjective:  Patient seen at beside with wife. States that he is feeling better. Denies any hemoptysis in the last 3 days.   Allergies: Oxycodone hcl and Bupropion  Medications: Prior to Admission medications   Medication Sig Start Date End Date Taking? Authorizing Provider  acetaminophen (TYLENOL) 500 MG tablet Take 1,000 mg by mouth every 8 (eight) hours as needed for moderate pain or headache.   Yes [provider]  Ascorbic Acid (VITAMIN C) 1000 MG tablet Take 1,000 mg by mouth daily.   Yes [provider]  gabapentin (NEURONTIN) 300 MG capsule Take 300-600 mg by mouth See admin instructions. Takes 300 mg in the morning and afternoon and 600 mg at night 03/12/21  Yes [provider]  HYDROmorphone (DILAUDID) 2 MG tablet Take 1-2 tablets (2-4 mg total) by mouth every 4 (four) hours as needed for severe pain. Patient taking differently: Take 4 mg by mouth every 4 (four) hours as needed for severe pain. 03/13/21  Yes Conte, Tessa N, PA-C  irbesartan (AVAPRO) 300 MG tablet Take 300 mg by mouth daily.   Yes [provider]  latanoprost (XALATAN) 0.005 % ophthalmic solution Place 1 drop into both eyes at bedtime.   Yes [provider]  lidocaine (LIDODERM) 5 % Place 1 patch onto the skin daily. Remove & Discard patch within 12 hours or as directed by MD 03/07/21  Yes Regalado, Belkys A, MD  pantoprazole (PROTONIX) 40 MG tablet Take 1 tablet (40 mg total) by mouth daily. 03/08/21  Yes Regalado, Belkys A, MD  penicillin v potassium (VEETID) 500 MG tablet Take 500 mg by mouth 4 (four) times daily. Start date : 03/14/21 03/14/21  Yes [provider]  polyethylene glycol (MIRALAX / GLYCOLAX) 17 g packet Take 17  g by mouth daily as needed for mild constipation. 03/07/21  Yes Regalado, Belkys A, MD  potassium chloride SA (KLOR-CON) 20 MEQ tablet Take 1 tablet (20 mEq total) by mouth daily. 03/07/21  Yes Regalado, Belkys A, MD  predniSONE (DELTASONE) 20 MG tablet Take 40 mg daily for 5 days, then 20 mg daily for 5 days then 10 mg daily for 5 days then stop. Patient taking differently: Take 20 mg by mouth daily with breakfast. Take 40 mg daily for 5 days, then 20 mg daily for 5 days then 10 mg daily for 5 days then stop. 03/07/21  Yes Regalado, Belkys A, MD  QUEtiapine (SEROQUEL) 50 MG tablet Take 100 mg by mouth at bedtime. 11/06/20  Yes [provider]  sertraline (ZOLOFT) 100 MG tablet Take 100 mg by mouth daily. 10/29/20  Yes [provider]  vitamin B-12 (CYANOCOBALAMIN) 1000 MCG tablet Take 1,000 mcg by mouth daily.   Yes [provider]  amLODipine (NORVASC) 10 MG tablet TAKE 1 TABLET BY MOUTH EVERY DAY 04/04/21   [name], MD  furosemide (LASIX) 20 MG tablet Take 1 tablet (20 mg total) by mouth daily. Patient not taking: No sig reported 03/07/21 03/18/21  Regalado, Jerald Kief A, MD     Vital Signs: BP 139/79 (BP Location: Left Arm)   Pulse (!) 107   Temp 97.7 F (36.5 C) (Oral)   Resp 14   Ht 5\' 10"  (1.778 m)   Wt 181 lb  10.5 oz (82.4 kg)   SpO2 97%   BMI 26.07 kg/m   Physical Exam Vitals and nursing note reviewed.  Constitutional:      Appearance: He is well-developed.  HENT:     Head: Normocephalic.  Pulmonary:     Effort: Pulmonary effort is normal.     Comments: Right inferior apical chest tube water seal. Musculoskeletal:        General: Normal range of motion.     Cervical back: Normal range of motion.  Skin:    General: Skin is dry.  Neurological:     Mental Status: He is alert and oriented to person, place, and time.    Imaging: DG Chest Port 1 View  Result Date: 04/07/2021 CLINICAL DATA:  Right-sided pleural effusion EXAM: PORTABLE CHEST  1 VIEW COMPARISON:  Yesterday FINDINGS: Midline trachea. Right-sided pigtail catheter again projects over the right lung apex with similar opacification. Mild right hemidiaphragm elevation. No well-defined pneumothorax. Clear left lung. Normal heart size. IMPRESSION: Similar appearance of the chest with pigtail catheter projecting over an area of opacified right lung apex. No new findings. Electronically Signed   By: Abigail Miyamoto M.D.   On: 04/07/2021 11:46   DG CHEST PORT 1 VIEW  Result Date: 04/06/2021 CLINICAL DATA:  Cough EXAM: PORTABLE CHEST 1 VIEW COMPARISON:  04/05/2021 FINDINGS: Slight interval increase in fluid opacification of the right pulmonary apex, with a pigtail drainage catheter remaining in position. Left lung is normally aerated. Heart and mediastinum are unremarkable. IMPRESSION: Slight interval increase in fluid opacification of the right pulmonary apex, with a pigtail drainage catheter remaining in position. Left lung is normally aerated. Electronically Signed   By: Eddie Candle M.D.   On: 04/06/2021 12:54   DG Chest Port 1 View  Result Date: 04/05/2021 CLINICAL DATA:  Right chest tube. EXAM: PORTABLE CHEST 1 VIEW COMPARISON:  Chest x-ray 04/04/2021.  CT 04/02/2021. FINDINGS: Surgical clips right upper chest. Right chest tube in stable position. No pneumothorax. Right apical cavitary fluid collection/mass again noted without interim change. No pleural effusion. Stable cardiomegaly. Mild thoracic spine scoliosis. IMPRESSION: 1.  Right chest tube in stable position.  No pneumothorax. 2. Large right apical cavitary fluid collection/mass again noted without interim change. Electronically Signed   By: Marcello Moores  Register   On: 04/05/2021 06:14    Labs:  CBC: Recent Labs    04/05/21 0241 04/06/21 0419 04/06/21 1944 04/07/21 0431 04/08/21 0444  WBC 10.6* 9.6  --  9.8 11.4*  HGB 7.6* 6.9* 8.9* 8.1* 8.4*  HCT 25.2* 22.0* 28.8* 26.3* 27.4*  PLT 261 246  --  267 290    COAGS: Recent  Labs    01/18/21 1500 02/25/21 2204 03/18/21 0246 03/18/21 1609 03/19/21 0215 04/03/21 0230  INR 1.0   < > 1.0 1.0 1.1 1.2  APTT 28  --   --   --  30  --    < > = values in this interval not displayed.    BMP: Recent Labs    04/03/21 0230 04/05/21 0241 04/06/21 0419 04/07/21 0431  NA 137 138 139 139  K 3.7 4.4 3.8 3.9  CL 99 101 102 99  CO2 31 31 31  32  GLUCOSE 154* 137* 126* 110*  BUN 17 13 11 11   CALCIUM 8.4* 8.9 8.5* 9.1  CREATININE 0.51* 0.60* 0.58* 0.53*  GFRNONAA >60 >60 >60 >60    LIVER FUNCTION TESTS: Recent Labs    04/02/21 0555 04/05/21 0241 04/06/21 0419 04/07/21  0431  BILITOT 1.3* 0.4 0.5 0.5  AST 46* 11* 10* 9*  ALT 31 19 18 18   ALKPHOS 86 64 61 60  PROT 6.7 5.7* 5.3* 5.9*  ALBUMIN 2.7* 2.6* 2.4* 2.6*    Assessment and Plan:  60 y.o. male inpatient. History of RUL adenocarcinoma s/p RUL lobectomy  5.16.2022 with subsequent chest tube placement in 6.20.2022 for apical right pleural effusion. The chest tube was removed. Patient was readmitted for recurrent hemoptysis. Found to be septic with a recurrent right apical plural effusion. IR placed a right apical chest tube on 7.27.22.   Per epic output from chest tube is 0 ml. 0 ml. 0 ml.   Chest xray from 7.30.20 shows pigtail catheter over opacified right lung. Cultures negative X 4 days, WBC is 11.4. The patient is afebrile  Per note from Dr. Roxan Hockey dated 7.31.21. Will place chest drain to water seal, with plan to have it removed tomorrow.  Case discussed with IR Attending chest tube will be scheduled to be removed on 8.1.22.   Electronically Signed: Jacqualine Mau, NP 04/08/2021, 12:39 PM   I spent a total of 15 Minutes at the patient's bedside AND on the patient's hospital floor or unit, greater than 50% of which was counseling/coordinating care for Right apical chest tube

## 2021-04-09 ENCOUNTER — Inpatient Hospital Stay (HOSPITAL_COMMUNITY): Payer: 59

## 2021-04-09 ENCOUNTER — Encounter (HOSPITAL_COMMUNITY): Payer: Self-pay | Admitting: Internal Medicine

## 2021-04-09 ENCOUNTER — Ambulatory Visit
Admission: RE | Admit: 2021-04-09 | Discharge: 2021-04-09 | Disposition: A | Discharge status: Home / Self Care | Payer: 59 | Source: Ambulatory Visit | Attending: Radiation Oncology | Admitting: Radiation Oncology

## 2021-04-09 DIAGNOSIS — C3411 Malignant neoplasm of upper lobe, right bronchus or lung: Secondary | ICD-10-CM | POA: Insufficient documentation

## 2021-04-09 DIAGNOSIS — Z510 Encounter for antineoplastic radiation therapy: Secondary | ICD-10-CM | POA: Insufficient documentation

## 2021-04-09 DIAGNOSIS — J851 Abscess of lung with pneumonia: Secondary | ICD-10-CM | POA: Diagnosis not present

## 2021-04-09 LAB — GLUCOSE, CAPILLARY
Glucose-Capillary: 178 mg/dL — ABNORMAL HIGH (ref 70–99)
Glucose-Capillary: 135 mg/dL — ABNORMAL HIGH (ref 70–99)
Glucose-Capillary: 210 mg/dL — ABNORMAL HIGH (ref 70–99)

## 2021-04-09 IMAGING — DX DG CHEST 1V PORT
1 series · 1 of 1 positions shown · non-contrast
Comparison: [DATE] at [8I] hours

CLINICAL DATA: Chest tube removal

EXAM:
PORTABLE CHEST 1 VIEW

[chest ap]
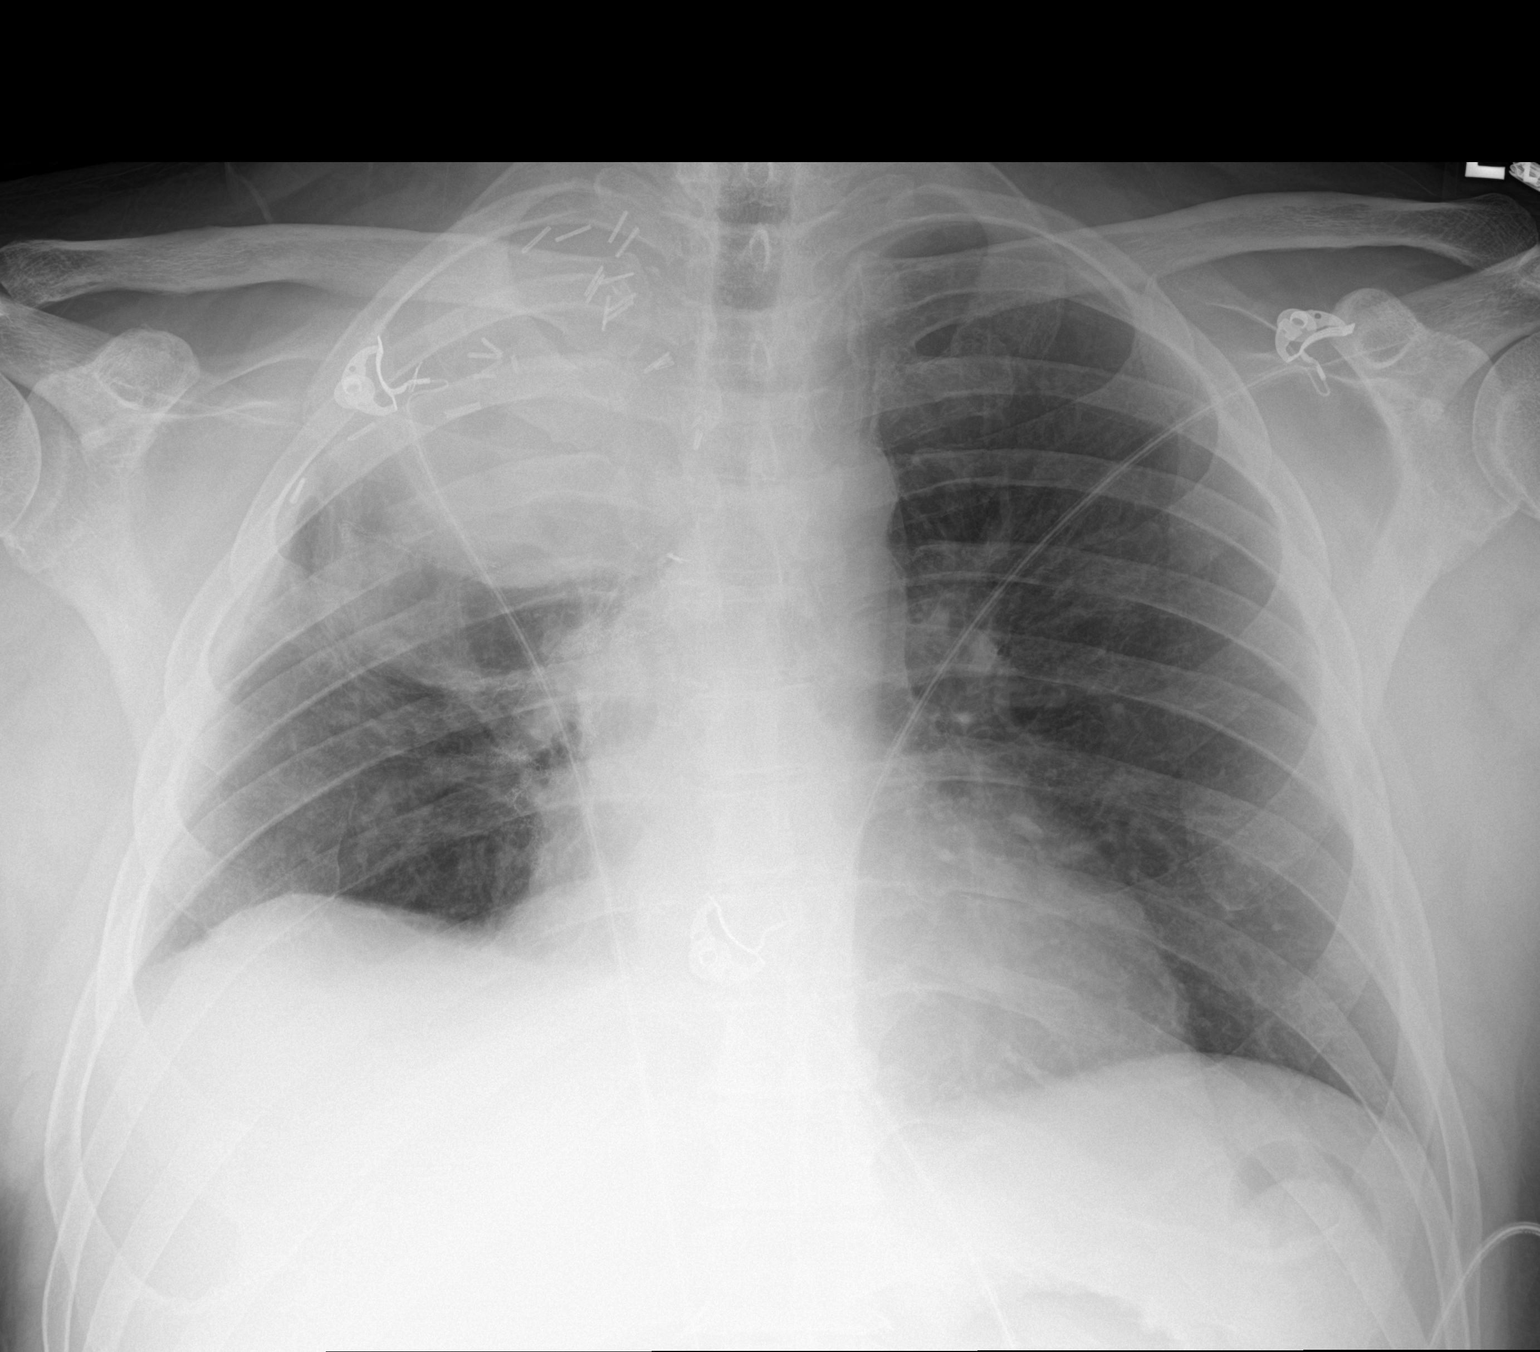

[1 of 1 positions shown; findings below may reference images not displayed]

FINDINGS: Interval removal of right-sided pleural drainage catheter. Large
rounded opacity persists at the right lung apex compatible with
known complex fluid and air containing collection. Multiple surgical
clips at the right lung apex. No pneumothorax is seen. Left lung is
clear. Heart size is stable.
IMPRESSION: Interval removal of right-sided pleural drainage catheter. No
pneumothorax.

## 2021-04-09 IMAGING — CT CT CHEST W/O CM
2 of 4 series · 15 of 36 positions shown, 18 images · non-contrast
Comparison: Chest CT [DATE], [DATE] and [DATE]

CLINICAL DATA: Evaluate right apical fluid collection following
drain placement. Back pain. History of lung cancer with right upper
lobectomy.

EXAM:
CT CHEST WITHOUT CONTRAST
TECHNIQUE: Multidetector CT imaging of the chest was performed following the
standard protocol without IV contrast.

[Series 2: thorax · axial · 0.84mm/px · z∈[-322,-34]mm · 12 of 169 slices shown, 15 images]
[im 13/169  mediastinal]
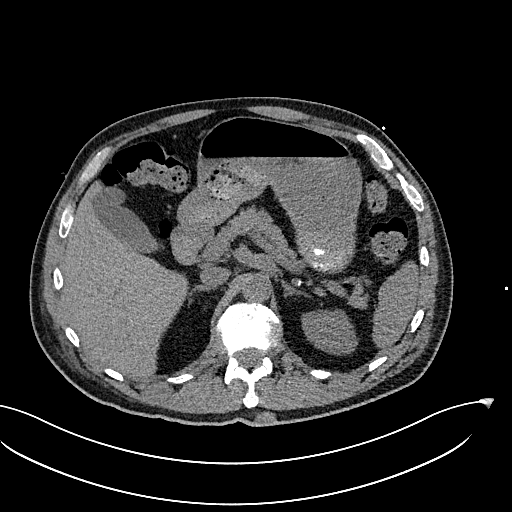
[im 13/169  lung]
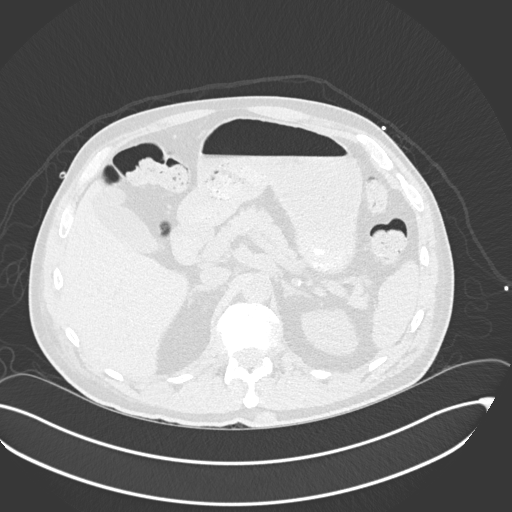
[im 25/169  lung]
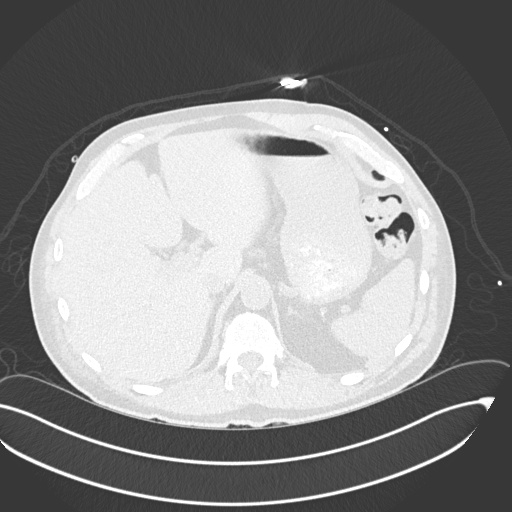
[im 37/169  lung]
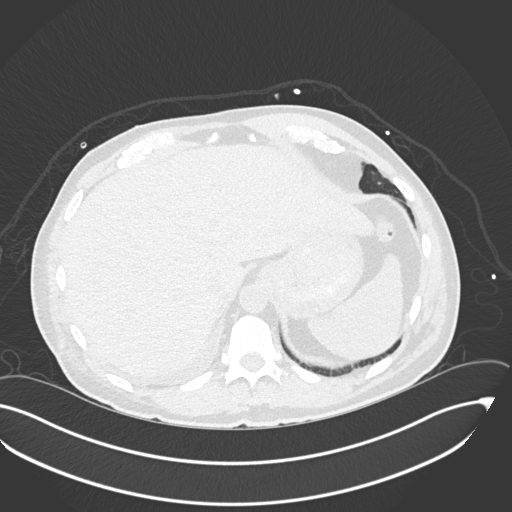
[im 49/169  lung]
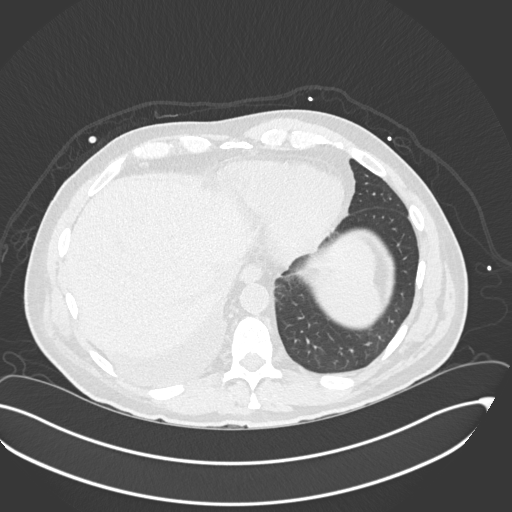
[im 61/169  mediastinal]
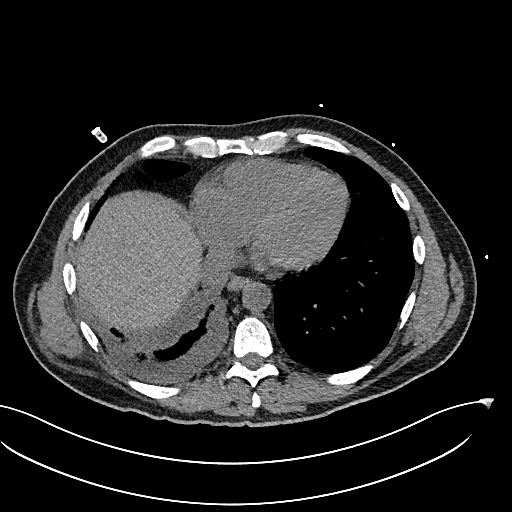
[im 61/169  lung]
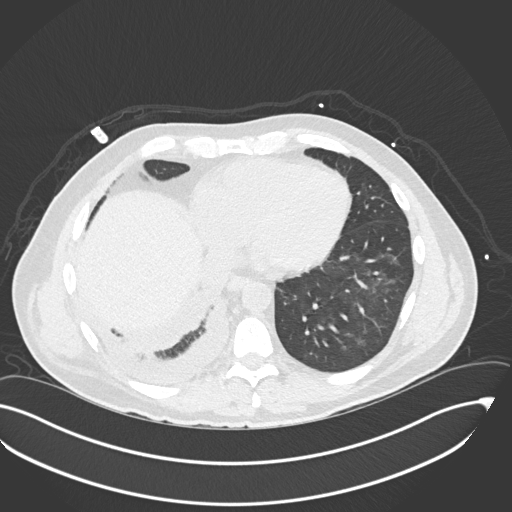
[im 73/169  lung]
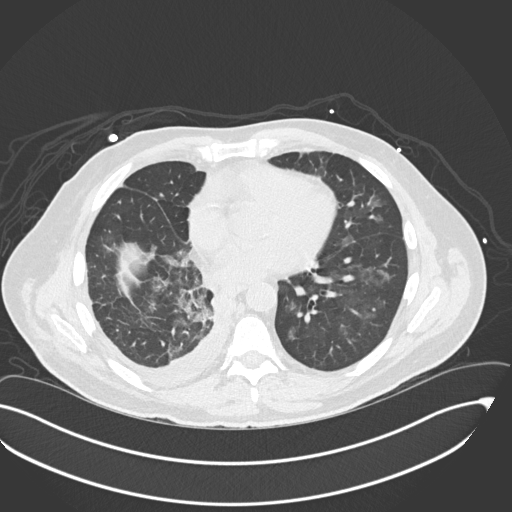
[im 97/169  lung]
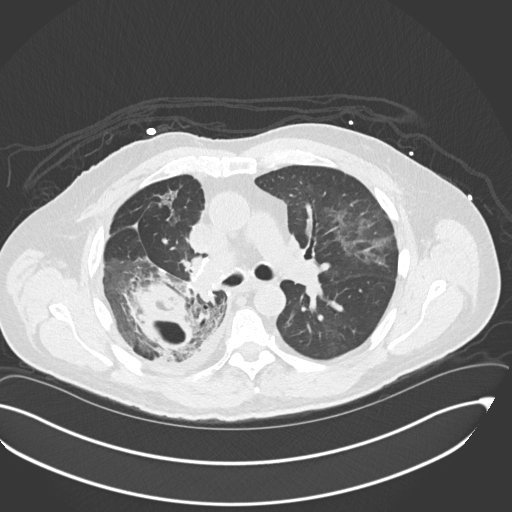
[im 109/169  lung]
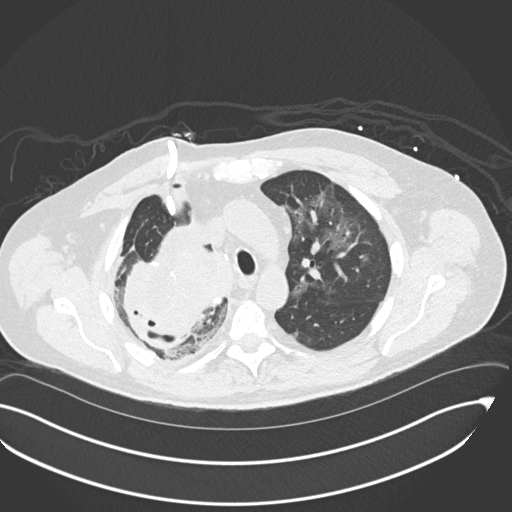
[im 121/169  mediastinal]
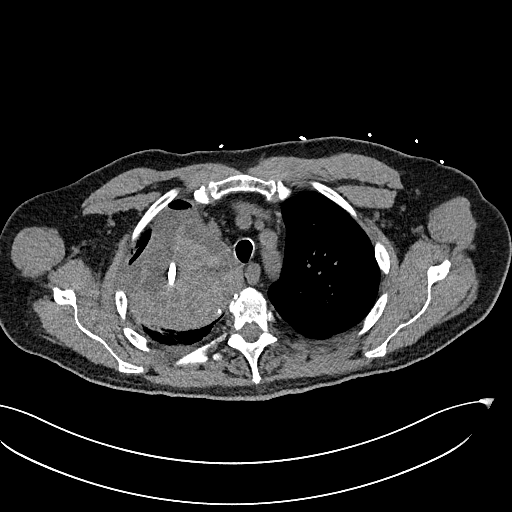
[im 121/169  lung]
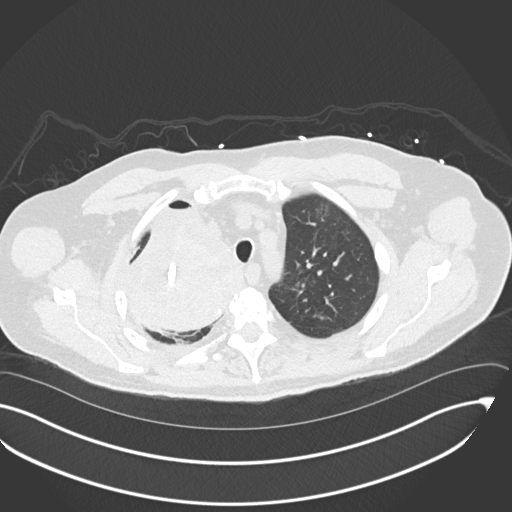
[im 133/169  lung]
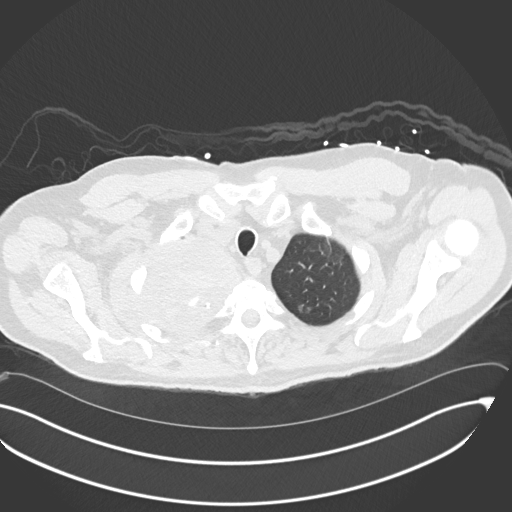
[im 145/169  lung]
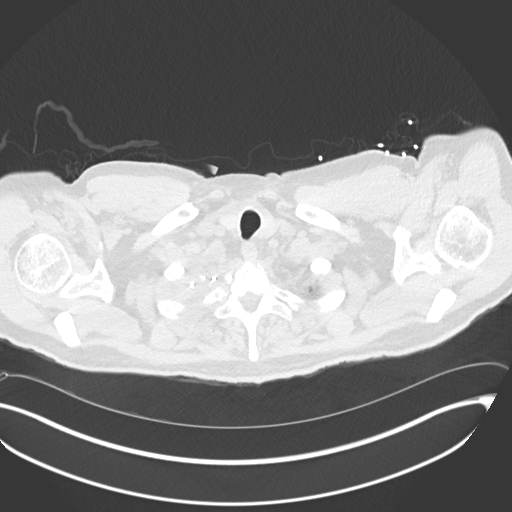
[im 157/169  lung]
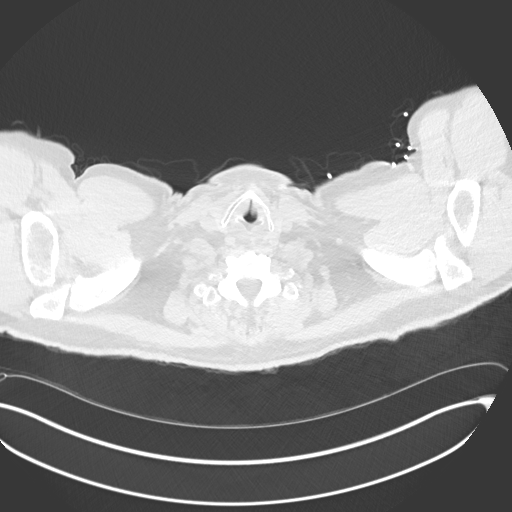

[Series 5: coronal · coronal · 0.67mm/px · 3 of 136 slices shown]
[im 28/136  lung]
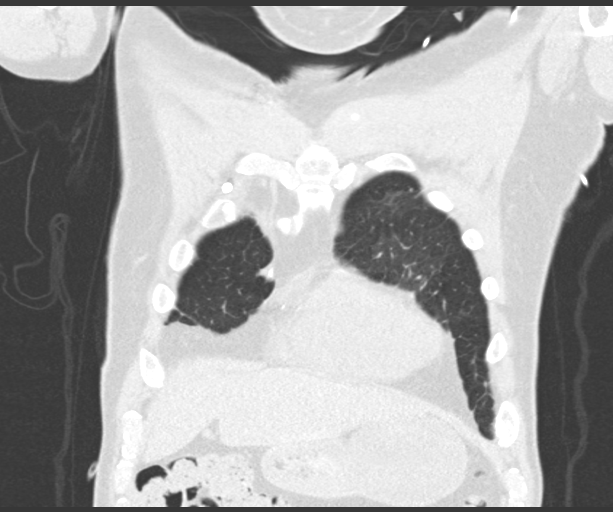
[im 55/136  lung]
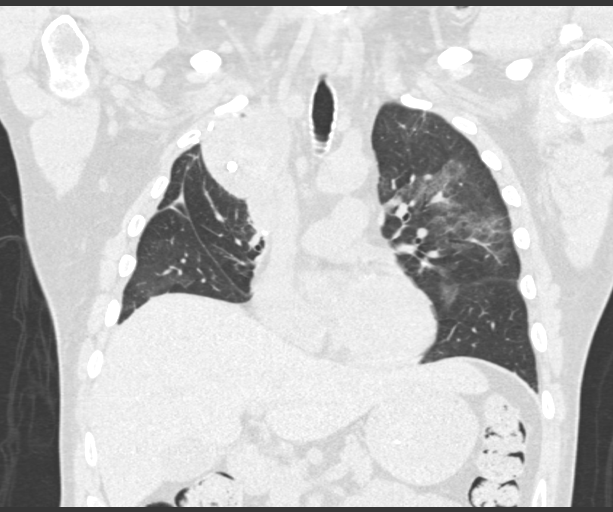
[im 82/136  lung]
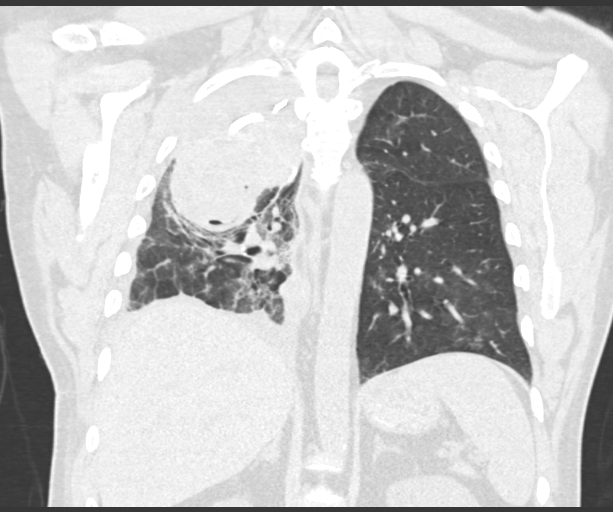

[15 of 36 positions shown; findings below may reference images not displayed]

FINDINGS: Cardiovascular: Coronary artery atherosclerosis and aortic valvular
calcifications are noted. No acute vascular findings are seen on
noncontrast imaging. The heart size is normal. There is no
pericardial effusion.

Mediastinum/Nodes: There are no enlarged mediastinal, hilar or
axillary lymph nodes. The thyroid gland, trachea and esophagus
demonstrate no significant findings.

Lungs/Pleura: Stable small to moderate dependent right pleural
effusion status post right upper lobectomy. Interval placement of a
percutaneous pigtail catheter into the complex right apical
collection. The superior component of this collection has mildly
decreased in size, now measuring approximately 9.7 x 7.2 x 7.4 cm
(previously 10.6 x 9.0 x 8.0 cm). This collection remains complex
with high-density components and internal air. Complex inferolateral
component containing air has not significantly changed, measuring up
to 7.5 cm on image 65/2. There is no pneumothorax. The previously
demonstrated patchy airspace opacities throughout lungs have
partially cleared.

Upper abdomen: The visualized upper abdomen appears stable without
significant findings. Probable small sebaceous cyst in the upper
back.

Musculoskeletal/Chest wall: There is no chest wall mass or
suspicious osseous finding. Mild bilateral gynecomastia.
IMPRESSION: 1. Interval mild decrease in size of complex fluid collection
superiorly in the right hemithorax following percutaneous drainage
catheter placement. There is persistent gas within this collection.
2. Interval improvement in previously demonstrated patchy bilateral
airspace opacities consistent with resolving hemorrhage or
infection.
3. Stable small right pleural effusion.  No pneumothorax.
4. Coronary artery atherosclerosis.

## 2021-04-09 IMAGING — DX DG CHEST 1V PORT
1 series · 1 of 1 positions shown · non-contrast
Comparison: Portable exam [JB] hours compared to [DATE]

CLINICAL DATA: Chest tube, possible pleural effusion

EXAM:
PORTABLE CHEST 1 VIEW

[chest ap]
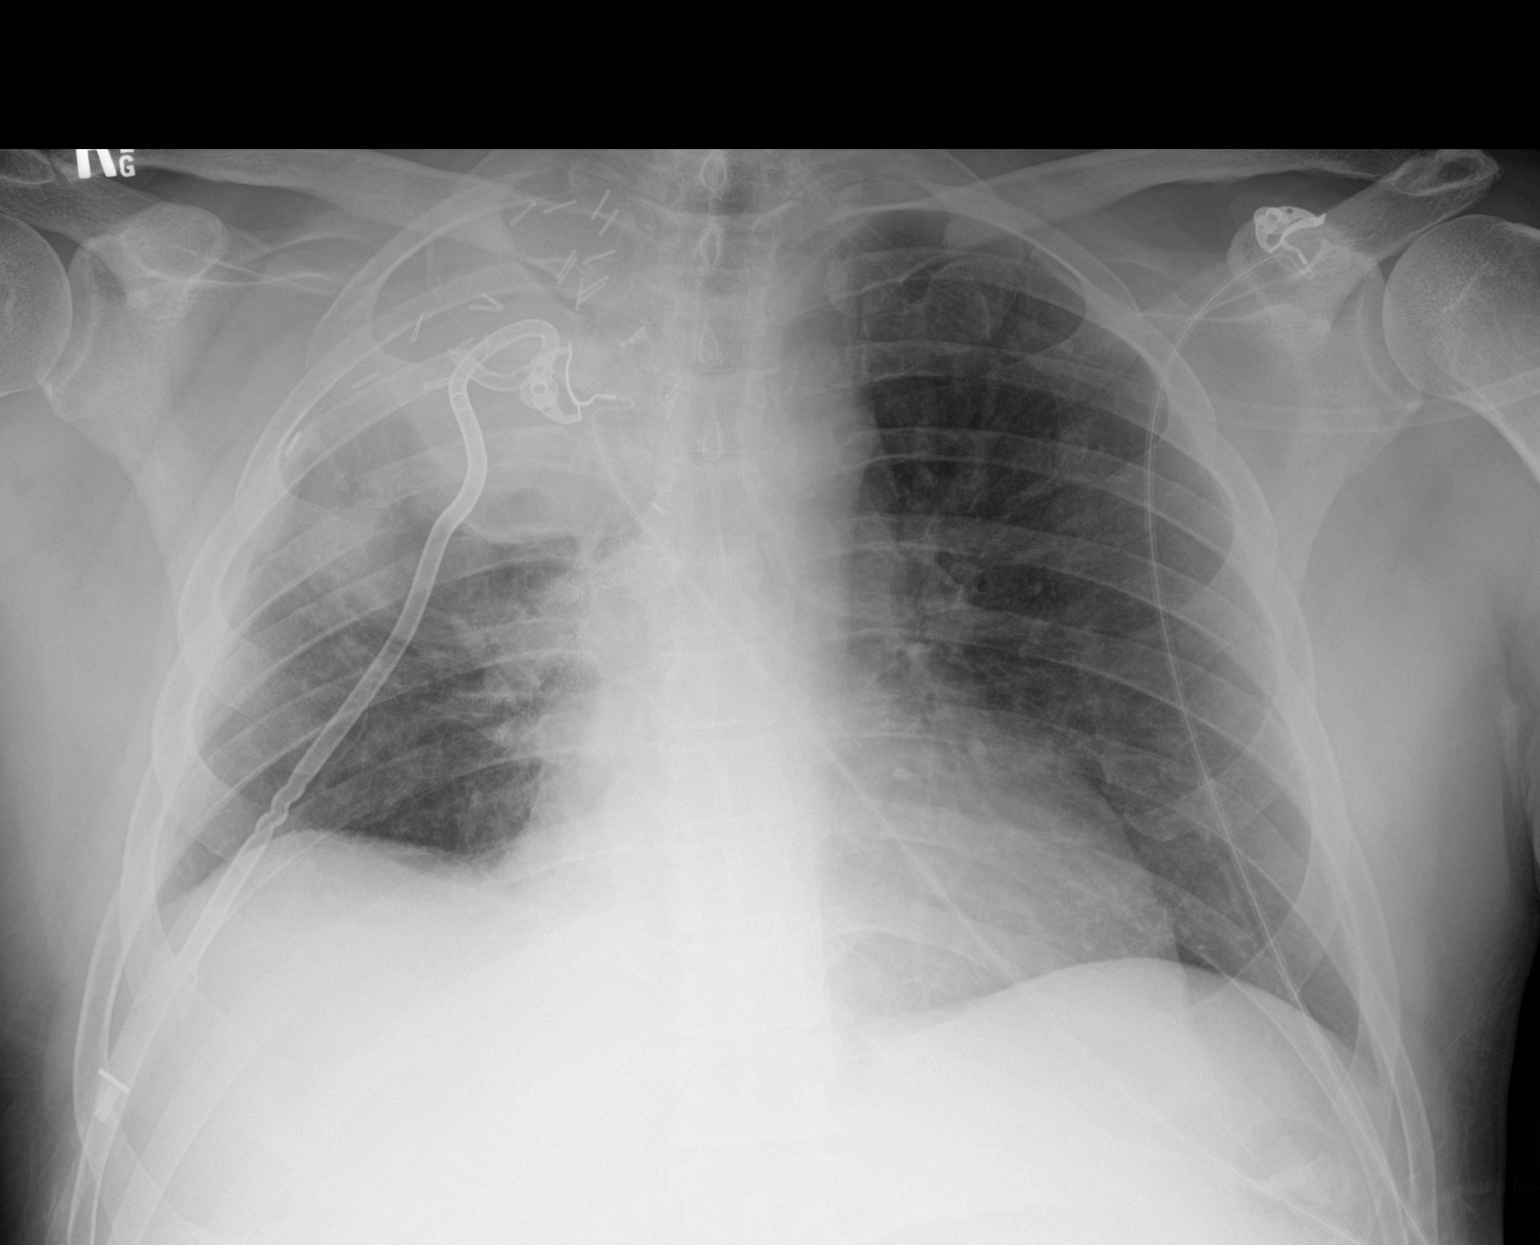

[1 of 1 positions shown; findings below may reference images not displayed]

FINDINGS: Pigtail RIGHT thoracostomy tube at upper RIGHT hemithorax unchanged.

Persistent pleuroparenchymal opacity at RIGHT apex as well as
numerous surgical clips, unchanged.

Subsegmental atelectasis mid RIGHT lung.

No pneumothorax or basilar pleural effusion.

LEFT lung clear.

Heart size stable.
IMPRESSION: No interval change in RIGHT apical opacity and pigtail thoracostomy
tube.

## 2021-04-09 MED ORDER — MORPHINE SULFATE 15 MG PO TABS
15.0000 mg | ORAL_TABLET | ORAL | Status: DC | PRN
  Administered 2021-04-09 – 2021-04-10 (×4): 15 mg via ORAL
  Filled 2021-04-09 (×4): qty 1

## 2021-04-09 MED ORDER — MORPHINE SULFATE 15 MG PO TABS: 15.0000 mg | ORAL_TABLET | ORAL | 0 refills | Status: DC | PRN

## 2021-04-09 MED ORDER — DEXTROSE 50 % IV SOLN
INTRAVENOUS | Status: AC
  Filled 2021-04-09: qty 50

## 2021-04-09 MED ORDER — FENTANYL CITRATE (PF) 100 MCG/2ML IJ SOLN
50.0000 ug | INTRAMUSCULAR | Status: DC | PRN
  Administered 2021-04-09: 75 ug via INTRAVENOUS
  Administered 2021-04-09 – 2021-04-10 (×4): 50 ug via INTRAVENOUS
  Filled 2021-04-09 (×4): qty 2

## 2021-04-09 NOTE — Progress Notes (Addendum)
Referring Physician(s): Hendrickson,S  Supervising Physician: Ruthann Cancer  Patient Status:  [facility] - In-pt  Chief Complaint:  Lung cancer, cough, dyspnea, right apical fluid collection  Subjective: Patient without new complaints today; still with occasional cough; last noted episode of blood-streaked sputum was on Saturday and minimal in amount; reports no increasing chest pain or worsening dyspnea.   Allergies: Oxycodone hcl and Bupropion  Medications: Prior to Admission medications   Medication Sig Start Date End Date Taking? Authorizing Provider  acetaminophen (TYLENOL) 500 MG tablet Take 1,000 mg by mouth every 8 (eight) hours as needed for moderate pain or headache.   Yes [provider]  Ascorbic Acid (VITAMIN C) 1000 MG tablet Take 1,000 mg by mouth daily.   Yes [provider]  gabapentin (NEURONTIN) 300 MG capsule Take 300-600 mg by mouth See admin instructions. Takes 300 mg in the morning and afternoon and 600 mg at night 03/12/21  Yes [provider]  HYDROmorphone (DILAUDID) 2 MG tablet Take 1-2 tablets (2-4 mg total) by mouth every 4 (four) hours as needed for severe pain. Patient taking differently: Take 4 mg by mouth every 4 (four) hours as needed for severe pain. 03/13/21  Yes Conte, Tessa N, PA-C  irbesartan (AVAPRO) 300 MG tablet Take 300 mg by mouth daily.   Yes [provider]  latanoprost (XALATAN) 0.005 % ophthalmic solution Place 1 drop into both eyes at bedtime.   Yes [provider]  lidocaine (LIDODERM) 5 % Place 1 patch onto the skin daily. Remove & Discard patch within 12 hours or as directed by MD 03/07/21  Yes Regalado, Belkys A, MD  morphine (MSIR) 15 MG tablet Take 1 tablet (15 mg total) by mouth every 4 (four) hours as needed for severe pain. 04/09/21  Yes Lane Hacker L, DO  pantoprazole (PROTONIX) 40 MG tablet Take 1 tablet (40 mg total) by mouth daily. 03/08/21  Yes Regalado, Belkys A, MD  penicillin v  potassium (VEETID) 500 MG tablet Take 500 mg by mouth 4 (four) times daily. Start date : 03/14/21 03/14/21  Yes [provider]  polyethylene glycol (MIRALAX / GLYCOLAX) 17 g packet Take 17 g by mouth daily as needed for mild constipation. 03/07/21  Yes Regalado, Belkys A, MD  potassium chloride SA (KLOR-CON) 20 MEQ tablet Take 1 tablet (20 mEq total) by mouth daily. 03/07/21  Yes Regalado, Belkys A, MD  predniSONE (DELTASONE) 20 MG tablet Take 40 mg daily for 5 days, then 20 mg daily for 5 days then 10 mg daily for 5 days then stop. Patient taking differently: Take 20 mg by mouth daily with breakfast. Take 40 mg daily for 5 days, then 20 mg daily for 5 days then 10 mg daily for 5 days then stop. 03/07/21  Yes Regalado, Belkys A, MD  QUEtiapine (SEROQUEL) 50 MG tablet Take 100 mg by mouth at bedtime. 11/06/20  Yes [provider]  sertraline (ZOLOFT) 100 MG tablet Take 100 mg by mouth daily. 10/29/20  Yes [provider]  vitamin B-12 (CYANOCOBALAMIN) 1000 MCG tablet Take 1,000 mcg by mouth daily.   Yes [provider]  amLODipine (NORVASC) 10 MG tablet TAKE 1 TABLET BY MOUTH EVERY DAY 04/04/21   [name], MD  benzonatate (TESSALON) 200 MG capsule Take 1 capsule (200 mg total) by mouth 2 (two) times daily. 04/08/21   Acquanetta Chain, DO  celecoxib (CELEBREX) 200 MG capsule Take 1 capsule (200 mg total) by mouth 2 (  two) times daily. 04/08/21   Acquanetta Chain, DO  diazepam (VALIUM) 10 MG tablet Take 0.5-1 tablets (5-10 mg total) by mouth daily as needed for anxiety (30 minutes PRIOR to Radiation). 04/08/21   Acquanetta Chain, DO  diazepam (VALIUM) 2 MG tablet Take 0.5 tablets (1 mg total) by mouth 3 (three) times daily. 04/08/21   Acquanetta Chain, DO  fentaNYL (DURAGESIC) 100 MCG/HR Place 1 patch onto the skin every 3 (three) days. 04/09/21 05/09/21  Acquanetta Chain, DO  furosemide (LASIX) 20 MG tablet Take 1 tablet (20 mg total) by mouth  daily. Patient not taking: No sig reported 03/07/21 03/18/21  Regalado, Jerald Kief A, MD     Vital Signs: BP 120/77 (BP Location: Left Arm)   Pulse (!) 102   Temp 98.8 F (37.1 C) (Oral)   Resp 17   Ht 5\' 10"  (1.778 m)   Wt 181 lb 10.5 oz (82.4 kg)   SpO2 94%   BMI 26.07 kg/m   Physical Exam awake, alert.  Right anterior chest tube intact, insertion site okay, not significantly tender to palpation, tube currently to waterseal; output since yesterday morning about 80-90 cc blood-tinged fluid; no airleak noted on Pleur-evac  Imaging: CT LUMBAR SPINE WO CONTRAST  Result Date: 04/09/2021 CLINICAL DATA:  Initial evaluation for low back pain. History of lung cancer. EXAM: CT LUMBAR SPINE WITHOUT CONTRAST TECHNIQUE: Multidetector CT imaging of the lumbar spine was performed without intravenous contrast administration. Multiplanar CT image reconstructions were also generated. COMPARISON:  None available. FINDINGS: Segmentation: Standard. Lowest well-formed disc space labeled the L5-S1 level. Alignment: Trace retrolisthesis of T12 on L1 and L2 on L3. Underlying minimal sigmoid scoliotic curvature. Vertebrae: Vertebral body height maintained without acute or chronic fracture. Visualized sacrum and pelvis intact. SI joints symmetric and normal. No discrete lytic or blastic osseous lesions to suggest osseous metastatic disease evident by CT. Subcentimeter sclerotic lesion at the posterior right iliac wing noted, most characteristic of a small benign bone island (series 2, image 134). Paraspinal and other soft tissues: Paraspinous soft tissues demonstrate no acute finding. 1.8 cm well-circumscribed hypodense lesion within the subcutaneous fat along the midline of the mid back at the level of T12-L1 most characteristic of a small sebaceous cyst/epidermal inclusion cyst (series 3, image 65). No other discrete soft tissue lesions. Small to moderate layering right pleural effusion partially visualized. Postoperative  changes noted about the partially visualized colon. Mild aorto bi-iliac atherosclerotic disease. Visualized visceral structures otherwise unremarkable. Disc levels: T12-L1: Trace retrolisthesis. Degenerative intervertebral disc space narrowing with disc desiccation and mild disc bulge. Superimposed right extraforaminal disc protrusion (series 3, image 60). No spinal stenosis. Mild right foraminal narrowing. Left neural foramina remains patent. L1-2: Degenerative intervertebral disc space narrowing with diffuse disc bulge, eccentric to the right. Mild facet hypertrophy. No significant spinal stenosis. Moderate right worse than left L1 foraminal narrowing. L2-3: Degenerative intervertebral disc space narrowing with disc desiccation and diffuse disc bulge. Mild reactive endplate spurring. Mild facet hypertrophy. No spinal stenosis. Mild left L2 foraminal narrowing. Right neural foramen remains patent. L3-4: Degenerative intervertebral disc space narrowing with diffuse disc bulge and mild disc desiccation. Minimal reactive endplate spurring. Mild facet hypertrophy. Resultant mild spinal stenosis. Mild bilateral L3 foraminal narrowing. L4-5: Degenerative intervertebral disc space narrowing with diffuse disc bulge and disc desiccation. Associated reactive endplate change with marginal endplate osteophytic spurring. Suspected superimposed small right foraminal disc protrusion (series 3, image 120). Mild to moderate facet hypertrophy. Mild canal with bilateral  lateral recess stenosis. Moderate right worse than left L4 foraminal narrowing. L5-S1: Degenerative intervertebral disc space narrowing with diffuse disc bulge and disc desiccation. Associated reactive endplate spurring. Mild to moderate right worse than left facet hypertrophy. No significant spinal stenosis. Moderate bilateral L5 foraminal narrowing, right worse than left. IMPRESSION: 1. No acute abnormality within the lumbar spine. No CT evidence for osseous  metastatic disease identified. If there is persistent clinical concern for possible occult metastatic disease, further assessment with dedicated MRI or bone scan would be recommended for further evaluation. 2. Moderate multilevel degenerative spondylolysis. Resultant mild spinal stenosis at L3-4 and L4-5, with mild-to-moderate multilevel foraminal narrowing as above, most pronounced at L4 and L5 bilaterally. 3. Small to moderate layering right pleural effusion, partially visualized. Electronically Signed   By: Jeannine Boga M.D.   On: 04/09/2021 02:42   CT PELVIS WO CONTRAST  Result Date: 04/09/2021 CLINICAL DATA:  Bilateral hip pain. Back pain. New onset pain limiting ability to walk. History of lung cancer. EXAM: CT PELVIS WITHOUT CONTRAST TECHNIQUE: Multidetector CT imaging of the pelvis was performed following the standard protocol without intravenous contrast. COMPARISON:  PET CT 12/13/2020 FINDINGS: Urinary Tract: Distal ureters are decompressed. Unremarkable urinary bladder. Bowel: Moderate stool in the included colon. No bowel inflammation. Vascular/Lymphatic: Iliac atherosclerosis.  No pelvic adenopathy. Reproductive:  Unremarkable prostate gland. Other:  No pelvic free fluid. Musculoskeletal: No blastic or destructive lytic lesions. No evidence of fracture. Both femoral heads are seated in the acetabulum with preservation of joint spaces. No visualized avascular necrosis. No periosteal reaction or bony destruction. No hip joint effusions. Minimal degenerative change of both sacroiliac joints. Lumbar spine assessed on concurrent lumbar spine CT. There are no intramuscular findings to suggest metastatic implants are disease. IMPRESSION: No findings of osseous metastatic disease on CT. No acute explanation for hip pain. If there is persistent clinical concern for bony metastasis, recommend further assessment with MRI or bone scan. Electronically Signed   By: Keith Rake M.D.   On: 04/09/2021  01:34   DG Chest Port 1 View  Result Date: 04/09/2021 CLINICAL DATA:  Chest tube, possible pleural effusion EXAM: PORTABLE CHEST 1 VIEW COMPARISON:  Portable exam 0507 hours compared to 04/07/2021 FINDINGS: Pigtail RIGHT thoracostomy tube at upper RIGHT hemithorax unchanged. Persistent pleuroparenchymal opacity at RIGHT apex as well as numerous surgical clips, unchanged. Subsegmental atelectasis mid RIGHT lung. No pneumothorax or basilar pleural effusion. LEFT lung clear. Heart size stable. IMPRESSION: No interval change in RIGHT apical opacity and pigtail thoracostomy tube. Electronically Signed   By: Lavonia Dana M.D.   On: 04/09/2021 08:15   DG Chest Port 1 View  Result Date: 04/07/2021 CLINICAL DATA:  Right-sided pleural effusion EXAM: PORTABLE CHEST 1 VIEW COMPARISON:  Yesterday FINDINGS: Midline trachea. Right-sided pigtail catheter again projects over the right lung apex with similar opacification. Mild right hemidiaphragm elevation. No well-defined pneumothorax. Clear left lung. Normal heart size. IMPRESSION: Similar appearance of the chest with pigtail catheter projecting over an area of opacified right lung apex. No new findings. Electronically Signed   By: Abigail Miyamoto M.D.   On: 04/07/2021 11:46   DG CHEST PORT 1 VIEW  Result Date: 04/06/2021 CLINICAL DATA:  Cough EXAM: PORTABLE CHEST 1 VIEW COMPARISON:  04/05/2021 FINDINGS: Slight interval increase in fluid opacification of the right pulmonary apex, with a pigtail drainage catheter remaining in position. Left lung is normally aerated. Heart and mediastinum are unremarkable. IMPRESSION: Slight interval increase in fluid opacification of the right pulmonary  apex, with a pigtail drainage catheter remaining in position. Left lung is normally aerated. Electronically Signed   By: Eddie Candle M.D.   On: 04/06/2021 12:54    Labs:  CBC: Recent Labs    04/05/21 0241 04/06/21 0419 04/06/21 1944 04/07/21 0431 04/08/21 0444  WBC 10.6* 9.6  --   9.8 11.4*  HGB 7.6* 6.9* 8.9* 8.1* 8.4*  HCT 25.2* 22.0* 28.8* 26.3* 27.4*  PLT 261 246  --  267 290    COAGS: Recent Labs    01/18/21 1500 02/25/21 2204 03/18/21 0246 03/18/21 1609 03/19/21 0215 04/03/21 0230  INR 1.0   < > 1.0 1.0 1.1 1.2  APTT 28  --   --   --  30  --    < > = values in this interval not displayed.    BMP: Recent Labs    04/03/21 0230 04/05/21 0241 04/06/21 0419 04/07/21 0431  NA 137 138 139 139  K 3.7 4.4 3.8 3.9  CL 99 101 102 99  CO2 31 31 31  32  GLUCOSE 154* 137* 126* 110*  BUN 17 13 11 11   CALCIUM 8.4* 8.9 8.5* 9.1  CREATININE 0.51* 0.60* 0.58* 0.53*  GFRNONAA >60 >60 >60 >60    LIVER FUNCTION TESTS: Recent Labs    04/02/21 0555 04/05/21 0241 04/06/21 0419 04/07/21 0431  BILITOT 1.3* 0.4 0.5 0.5  AST 46* 11* 10* 9*  ALT 31 19 18 18   ALKPHOS 86 64 61 60  PROT 6.7 5.7* 5.3* 5.9*  ALBUMIN 2.7* 2.6* 2.4* 2.6*    Assessment and Plan: Patient with history of stage IV lung cancer with prior right upper lobectomy in May 2022 and chest tube placement in June for loculated right apical effusion with eventual subsequent removal.  Now admitted with recurrent hemoptysis and fever and imaging revealing recurrent right apical complex fluid collection, status post right chest drain placement on 7/26; afebrile, output last 24 hours about 80 to 90 cc blood-tinged fluid; no new labs today; fluid cultures negative; follow-up CT chest today reviewed by Dr. Serafina Royals.  Reveals improvement but not resolution of fluid component of right apical collection; recommend placing tube back to low wall suction; continue to closely monitor output; if patient develops significant hemoptysis consider follow-up CT angio of chest to evaluate candidacy for possible bronchial artery embolization if needed; above discussed with Dr. Chase Caller   Electronically Signed: D. Rowe Robert, PA-C 04/09/2021, 11:56 AM   I spent a total of 15 Minutes at the the patient's bedside AND on  the patient's hospital floor or unit, greater than 50% of which was counseling/coordinating care for right chest drain    Patient ID: Christopher Carter, male   DOB: 03/26/1961, 60 y.o.   MRN: [id number]

## 2021-04-09 NOTE — Progress Notes (Signed)
Palliative Care Progress Note                                                                                                                                                                                                           Daily Progress Note    Patient Name: Christopher Carter                                                                           Date: DOB: 1961/02/10  Age: 60 y.o. MRN#: [id number] Attending Physician: Shelly Coss, MD Primary Care Physician: [name], MD Admit Date: 03/18/2021   Reason for Consultation/Follow-up: Pain control   Subjective: Much improved. Plan for removal of CT tomorrow and possible discharge home.             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 male with past medical history of adenocarcinoma of the lung s/p right upper lobe lobectomy and chest wall dissection by Dr. Koleen Nimrod on 5/16, hypertension, anxiety, and depression admitted on 03/18/2021 with hemoptysis, shortness of breath, and uncontrolled cancer-related pain.   Palliative medicine has been consulted to assist with pain management and goals of care conversation.     Assessment: Stage IV lung  adenocarcinoma now s/p right upper lobectomy Acute on chronic hypoxic respiratory failure, improved to baseline Recurrent hemoptysis, improving s/p chest tube Uncontrolled cancer-related pain, improving     Recommendations/Plan: Chest tube to be removed tomorrow Home pain regimen as follow: Fentanyl  169mcg q72 hours- PA requested MSIR 15 q4 prn breakthrough pain Gabapentin as ordered Valium as ordered  I will see in follow-up in cancer center.   Goals of Care and Additional Recommendations: Limitations on Scope of Treatment: Full Scope Treatment   Code Status: FULL CODE     Prognosis:  Unable to determine   Discharge Planning: Home with Palliative Services in [place] was discussed with patient, his wife.                  Lane Hacker, DO Palliative Medicine  Time: 35 minutes Greater than 50%  of this time was spent counseling and coordinating care related to the above assessment and plan.

## 2021-04-09 NOTE — Progress Notes (Signed)
PT Cancellation Note / Discharge  Patient Details Name: Christopher Carter MRN: [id number] DOB: 06/20/1961   Cancelled Treatment:    Reason Eval/Treat Not Completed: Other (comment) Pt had chest tube removed.  Pt going for radiation and then chest xray.  RN reports pt is able to mobilize around room without assist and denies acute PT needs at this time.  PT to sign off.  Please reorder if new needs arise.   Kamil Mchaffie,[name] 04/09/2021, 2:23 PM Kati PT, DPT Acute Rehabilitation Services Pager: [phone] Office: [phone]

## 2021-04-09 NOTE — Progress Notes (Signed)
NAME:  Christopher Carter, MRN:  [id number], DOB:  09/13/1960, LOS: 21 ADMISSION DATE:  03/18/2021, CONSULTATION DATE:  03/18/2021 REFERRING MD:  Dr. Tamala Julian, CHIEF COMPLAINT:  Hemoptysis    brief  Christopher Carter is a 60 y.o. 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 Patient has been treated at this facility for similar presentation and underwent video bronchoscopy and IR placed chest tube for pleural effusion with Dr. Roxan Hockey. He was discharged in stable condition with plans to follow up with cardiothoracic surgery and oncology.  He presented this admission with recurrent hemoptysis that started 4hr prior to admission with associated right upper back pain that radiates to upper neck. Patient was transferred from [place] to [facility] for cardiothoracic, pulmonary, and IR consults. Vital signs stable, WBC elevated at 18.1. CT chest was obtained and consistent with alveolar hemorrhage localized to the right with right apical fluid collection.   Pertinent  Medical History  Stage IIIa adenocarcinoma now S/P right upper lobectomy with node dissection 01/22/2021, on 5L [place] at baseline, tobacco abuse, HTN, and depression   Significant Hospital Events:  7/10 admitted with recurrent hemoptysis  7/11 bronch- nonfocal source, had bloody frothy secretions in all airways, petechiae on bronchi Receiving radiation therapy Pain is better controlled 7/25 worsening hypoxia and new temp to 104 7/26 chest tube by IR planned 7/27 chest tube successfully placed, down to 3 L nasal cannula 7/29 - Continues to slowly improve, down to 1L [place] O2, mobilizing, Hgb did drift down to 6.9. streaky hmeoptysis +  Interim History / Subjective:   8/1 - last \\hmeoptysis  04/07/21 AM was small/mild,. Feels well otherwise. RT  chest tube drain - minimal output only. Per RN - going for CT chest to determine if tube can come out. CVTS recommending opd augmentin and ok for dc. On 1 L [place]. [place] stable. No other compalitns  Objective   Blood pressure 132/83, pulse 99, temperature 97.6 F (36.4 C), temperature source Oral, resp. rate 17, height 5\' 10"  (1.778 m), weight 82.4 kg, SpO2 99 %.        Intake/Output Summary (Last 24 hours) at 04/09/2021 1022 Last data filed at 04/09/2021 9833 Gross per 24 hour  Intake 963 ml  Output 980 ml  Net -17 ml   Filed Weights   03/18/21 0242 03/25/21 1542 04/02/21 1330  Weight: 79.4 kg 81.7 kg 82.4 kg   General Appearance:  Looks stable i Head:  Normocephalic, without obvious abnormality, atraumatic Eyes:  PERRL - yes, conjunctiva/corneas - muddy     Ears:  Normal external ear canals, both ears Nose:  G tube - no but has [place] Throat:  ETT TUBE - no , OG tube - no Neck:  Supple,  No enlargement/tenderness/nodules Lungs: Clear to auscultation bilaterally,  R anterior chest tube + Heart:  S1 and S2 normal, no murmur, CVP - nop.  Pressors - no Abdomen:  Soft, no masses, no organomegaly Genitalia / Rectal:  Not done Extremities:  Extremities- intact Skin:  ntact in exposed areas . Sacral area - not examined Neurologic:  Sedation - none -> RASS - +1 . Moves all 4s - yes. CAM-ICU - neg . Orientation - x3+    LABS    PULMONARY No results for input(s): PHART, PCO2ART, PO2ART, HCO3, TCO2, O2SAT in the last 168 hours.  Invalid input(s): PCO2, PO2  CBC Recent Labs  Lab 04/06/21 0419 04/06/21 1944 04/07/21 0431 04/08/21 0444  HGB 6.9* 8.9* 8.1* 8.4*  HCT 22.0* 28.8* 26.3* 27.4*  WBC 9.6  --  9.8 11.4*  PLT 246  --  267 290    COAGULATION Recent Labs  Lab 04/03/21 0230  INR 1.2    CARDIAC  No results for input(s): TROPONINI in the last 168 hours. No results for input(s): PROBNP in the last 168 hours.   CHEMISTRY Recent Labs  Lab 04/03/21 0230  04/05/21 0241 04/06/21 0419 04/07/21 0431  NA 137 138 139 139  K 3.7 4.4 3.8 3.9  CL 99 101 102 99  CO2 31 31 31  32  GLUCOSE 154* 137* 126* 110*  BUN 17 13 11 11   CREATININE 0.51* 0.60* 0.58* 0.53*  CALCIUM 8.4* 8.9 8.5* 9.1  MG 2.3  --   --   --   PHOS 3.2  --   --   --    Estimated Creatinine Clearance: 103.9 mL/min (A) (by C-G formula based on SCr of 0.53 mg/dL (L)).   LIVER Recent Labs  Lab 04/03/21 0230 04/05/21 0241 04/06/21 0419 04/07/21 0431  AST  --  11* 10* 9*  ALT  --  19 18 18   ALKPHOS  --  64 61 60  BILITOT  --  0.4 0.5 0.5  PROT  --  5.7* 5.3* 5.9*  ALBUMIN  --  2.6* 2.4* 2.6*  INR 1.2  --   --   --      INFECTIOUS Recent Labs  Lab 04/02/21 1102  LATICACIDVEN 1.4     ENDOCRINE CBG (last 3)  Recent Labs    04/08/21 1635 04/08/21 2231 04/09/21 0748  GLUCAP 198* 162* 178*         IMAGING x48h  - image(s) personally visualized  -   highlighted in bold CT LUMBAR SPINE WO CONTRAST  Result Date: 04/09/2021 CLINICAL DATA:  Initial evaluation for low back pain. History of lung cancer. EXAM: CT LUMBAR SPINE WITHOUT CONTRAST TECHNIQUE: Multidetector CT imaging of the lumbar spine was performed without intravenous contrast administration. Multiplanar CT image reconstructions were also generated. COMPARISON:  None available. FINDINGS: Segmentation: Standard. Lowest well-formed disc space labeled the L5-S1 level. Alignment: Trace retrolisthesis of T12 on L1 and L2 on L3. Underlying minimal sigmoid scoliotic curvature. Vertebrae: Vertebral body height maintained without acute or chronic fracture. Visualized sacrum and pelvis intact. SI joints symmetric and normal. No discrete lytic or blastic osseous lesions to suggest osseous metastatic disease evident by CT. Subcentimeter sclerotic lesion at the posterior right iliac wing noted, most characteristic of a small benign bone island (series 2, image 134). Paraspinal and other soft tissues: Paraspinous soft  tissues demonstrate no acute finding. 1.8 cm well-circumscribed hypodense lesion within the subcutaneous fat along the midline of the mid back at the level of T12-L1 most characteristic of a small sebaceous cyst/epidermal inclusion cyst (series 3, image 65). No other discrete soft tissue lesions. Small to moderate layering right pleural effusion partially visualized. Postoperative changes noted about the partially visualized colon. Mild aorto bi-iliac atherosclerotic disease. Visualized visceral structures otherwise unremarkable. Disc levels: T12-L1: Trace retrolisthesis. Degenerative intervertebral disc space narrowing with disc desiccation and mild disc bulge. Superimposed right extraforaminal disc protrusion (series 3, image 60). No spinal stenosis. Mild right foraminal narrowing. Left neural foramina remains patent. L1-2: Degenerative intervertebral disc space narrowing with diffuse disc bulge, eccentric to the right. Mild facet hypertrophy. No significant  spinal stenosis. Moderate right worse than left L1 foraminal narrowing. L2-3: Degenerative intervertebral disc space narrowing with disc desiccation and diffuse disc bulge. Mild reactive endplate spurring. Mild facet hypertrophy. No spinal stenosis. Mild left L2 foraminal narrowing. Right neural foramen remains patent. L3-4: Degenerative intervertebral disc space narrowing with diffuse disc bulge and mild disc desiccation. Minimal reactive endplate spurring. Mild facet hypertrophy. Resultant mild spinal stenosis. Mild bilateral L3 foraminal narrowing. L4-5: Degenerative intervertebral disc space narrowing with diffuse disc bulge and disc desiccation. Associated reactive endplate change with marginal endplate osteophytic spurring. Suspected superimposed small right foraminal disc protrusion (series 3, image 120). Mild to moderate facet hypertrophy. Mild canal with bilateral lateral recess stenosis. Moderate right worse than left L4 foraminal narrowing. L5-S1:  Degenerative intervertebral disc space narrowing with diffuse disc bulge and disc desiccation. Associated reactive endplate spurring. Mild to moderate right worse than left facet hypertrophy. No significant spinal stenosis. Moderate bilateral L5 foraminal narrowing, right worse than left. IMPRESSION: 1. No acute abnormality within the lumbar spine. No CT evidence for osseous metastatic disease identified. If there is persistent clinical concern for possible occult metastatic disease, further assessment with dedicated MRI or bone scan would be recommended for further evaluation. 2. Moderate multilevel degenerative spondylolysis. Resultant mild spinal stenosis at L3-4 and L4-5, with mild-to-moderate multilevel foraminal narrowing as above, most pronounced at L4 and L5 bilaterally. 3. Small to moderate layering right pleural effusion, partially visualized. Electronically Signed   By: Jeannine Boga M.D.   On: 04/09/2021 02:42   CT PELVIS WO CONTRAST  Result Date: 04/09/2021 CLINICAL DATA:  Bilateral hip pain. Back pain. New onset pain limiting ability to walk. History of lung cancer. EXAM: CT PELVIS WITHOUT CONTRAST TECHNIQUE: Multidetector CT imaging of the pelvis was performed following the standard protocol without intravenous contrast. COMPARISON:  PET CT 12/13/2020 FINDINGS: Urinary Tract: Distal ureters are decompressed. Unremarkable urinary bladder. Bowel: Moderate stool in the included colon. No bowel inflammation. Vascular/Lymphatic: Iliac atherosclerosis.  No pelvic adenopathy. Reproductive:  Unremarkable prostate gland. Other:  No pelvic free fluid. Musculoskeletal: No blastic or destructive lytic lesions. No evidence of fracture. Both femoral heads are seated in the acetabulum with preservation of joint spaces. No visualized avascular necrosis. No periosteal reaction or bony destruction. No hip joint effusions. Minimal degenerative change of both sacroiliac joints. Lumbar spine assessed on  concurrent lumbar spine CT. There are no intramuscular findings to suggest metastatic implants are disease. IMPRESSION: No findings of osseous metastatic disease on CT. No acute explanation for hip pain. If there is persistent clinical concern for bony metastasis, recommend further assessment with MRI or bone scan. Electronically Signed   By: Keith Rake M.D.   On: 04/09/2021 01:34   DG Chest Port 1 View  Result Date: 04/09/2021 CLINICAL DATA:  Chest tube, possible pleural effusion EXAM: PORTABLE CHEST 1 VIEW COMPARISON:  Portable exam 0507 hours compared to 04/07/2021 FINDINGS: Pigtail RIGHT thoracostomy tube at upper RIGHT hemithorax unchanged. Persistent pleuroparenchymal opacity at RIGHT apex as well as numerous surgical clips, unchanged. Subsegmental atelectasis mid RIGHT lung. No pneumothorax or basilar pleural effusion. LEFT lung clear. Heart size stable. IMPRESSION: No interval change in RIGHT apical opacity and pigtail thoracostomy tube. Electronically Signed   By: Lavonia Dana M.D.   On: 04/09/2021 08:15      Resolved Hospital Problem list     Assessment & Plan:   Acute on chronic hypoxemic respiratory failure - presumed 2/2 PNA with probable lung abscess RUL. Final culture from pleural fluid  7/27 - neg as of 04/20/21 Respiratory culture with rare normal respiratory flora Chest tube initally with> 1 L out displacement, continued serosanguineous drainage - now down to  -S/p RUL chest tube placement by IR on 7/26  04/09/2021 - on and off streaky hemoptysuis. Last 04/07/21 . Chest tube output minimal. Afebrile  P: -o2 for pulse ox > 88% - Chest tube removal based on CT and CVTS/[facility]/IR input - abx for abscess - baed on scvts input - BD   Hemoptysis    8/1  - present on and off. L:ast 48h ago   Plan  - he is at risk for invasicve massive hemoptysis - advised to monitor. IF worsens or recurs big -> needs IR embolization    Stage IV adenocarcinoma of the lung s/p right upper  lobectomy.  [place] -Supportive care and outpatient oncology follow-up - needs goals of  care ongoing with palliative care - full code  Pain control - 2/2 above.  Plan - Continue pain control per palliative medicine, fentanyl adjusted and patient feels that pain is currently controlled  DM  anticipate slight worsening of CBG's with steroids. -Continue sliding scale insulin and Lantus  Dental pain / possible infection  had been treated with PenV as outpatient with planned root canal 7/11; however, this was postponed.  Plan - Dental surgery recommending abx and outpatient follow up.   Best Practice   Per [facility] C[facility] followup below - ccm will sign off     Future Appointments  Date Time Provider [place]  04/09/2021  3:30 PM [facility] LINAC 3 [facility] None  04/10/2021  3:00 PM [facility] LAB [facility] None  04/10/2021  3:30 PM [facility] LINAC 3 [facility] None  04/10/2021  3:45 PM Curt Bears, MD [facility] None  04/11/2021  4:00 PM [facility] [id number] [facility] None  04/12/2021  4:00 PM [facility] [id number] [facility] None  04/13/2021  4:15 PM [facility] LINAC 3 [facility] None  04/16/2021  4:15 PM [facility] LINAC 3 [facility] None  04/17/2021  4:15 PM [facility] LINAC 3 [facility] None  04/18/2021  4:15 PM [facility] LINAC 3 [facility] None  04/19/2021  4:15 PM [facility] LINAC 3 [facility] None  04/20/2021 11:00 AM Martyn Ehrich, NP [facility] None  04/20/2021  4:15 PM [facility] LINAC 3 [facility] None  04/23/2021  4:15 PM [facility] LINAC 3 [facility] None  04/24/2021  4:15 PM [facility] LINAC 3 [facility] None  04/25/2021  4:15 PM [facility] LINAC 3 [facility] None  05/22/21  4:15 PM [facility] LINAC 3 [facility] None  04/27/2021  4:15 PM [facility] LINAC 3 [facility] None  04/30/2021  4:15 PM [facility] LINAC 3 [facility] None  05/01/2021  4:15 PM [facility] LINAC 3 [facility] None  05/02/2021  4:15 PM [facility] LINAC 3  [facility] None  05/03/2021  4:15 PM [facility] LINAC 3 [facility] None  05/04/2021  3:45 PM [facility] LINAC 3 [facility] None  05/07/2021  3:45 PM [facility] LINAC 3 [facility] None     SIGNATURE    Dr. Brand Males, M.D., F.C.C.P,  Pulmonary and Critical Care Medicine Staff Physician, [place] Director - Interstitial Lung Disease  Program  [address] [place], [place], [address]  NPI Number:  NPI #[id number]  Pager: [phone], If no answer  -> Check AMION or Try [phone] Telephone (clinical office): [phone] Telephone (research): [phone]  10:31 AM 04/09/2021

## 2021-04-09 NOTE — Progress Notes (Signed)
Palliative Medicine RN Note: Rec'd request to help with Prior Auth for Fentanyl patches. Submitted via cover my meds, and it was approved:  Approvedtoday Request Reference Number: [id number]. FENTANYL DIS 100MCG/H is approved through 04/09/2022. Your patient may now fill this prescription and it will be covered.  Marjie Skiff Marquesha Robideau, RN, BSN, [facility] Palliative Medicine Team 04/09/2021 11:48 AM Office [phone]

## 2021-04-09 NOTE — Progress Notes (Signed)
      [address]       [name] [address]             [phone]        Contacted by Dr. Wyline Copas concerning patient's chest tube had purulent drainage present from around the site.  Upon further investigation the chest tube was not removed from IR due to the results of the CT scan and output of 80-90 cc.  I discussed with Dr. Roxan Hockey who felt the chest tube should be removed as the collection will most likely not completely drain.  He recommends continuation of antibiotics.  The chest tube was removed without difficulty.  Will get a CXR this afternoon with repeat in AM.   Ellwood Handler, PA-C

## 2021-04-09 NOTE — Progress Notes (Signed)
PROGRESS NOTE    Christopher Carter  UVO:[id number] DOB: 28-Nov-1960 DOA: 03/18/2021 PCP: [name], MD    Brief Narrative:  60 year old M with PMH of stage IV adenocarcinoma s/p RUL lobectomy with node dissection on 5/16, recurrent hemoptysis, chronic RF on 5 L, tobacco abuse, HTN, depression, loculated hemithorax treated with IR chest tube placement, empiric antibiotics and prednisone taper and discharged to follow-up with CTS and radiation oncology.  He returned with cough with hemoptysis, right upper back pain, and increased oxygen requirement.  CT chest showed airspace disease in right lung representing alveolar hemorrhage with recurrence of apical fluid collection, nodular masslike lesion concerning for recurrence of malignancy and right peritracheal adenopathy.  He was admitted to [facility].  CTS, pulmonary IR and palliative consulted.  Started on IV cefepime, systemic steroid and tranexamic acid nebulization.  He underwent bronchoscopy that did not reveal active bleeding. Started on PCA Dilaudid by PMT for pain control, and transferred to [facility] for radiation oncology care.    Patient's hemoptysis resolved, oxygen requirement improved, and made good progression until 7/24 when he had small-volume hemoptysis on tissue papers.  Then, he spiked fever, tachycardia, tachypnea with increased oxygen requirement the early morning of 7/25.  CTA chest negative for large central PE but with new gas in complex right apical collection concerning for communication with the airways, adjacent increase in bilateral airspace disease.  Cultures obtained.  Started on broad-spectrum antibiotics with Vanco and Zosyn.  Transferred to stepdown unit.  CTS consulted and recommended drain placement by IR.    Assessment & Plan:   Principal Problem:   Intra-alveolar hemorrhage Active Problems:   Essential hypertension   Acute on chronic respiratory failure with hypoxia (HCC)   Malignant neoplasm of right upper  lobe of lung (HCC)   Major depressive disorder   Uncontrolled pain   Leukocytosis   Normocytic anemia   Tooth abscess   Thrombocytosis   Palliative care by specialist   Palliative care encounter   Abscess of upper lobe of right lung with pneumonia ([place])  Intra-alveolar hemorrhage in patient with history of recurrent hemoptysis-likely due to lung cancer. 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 s/p RUL lobectomy and node dissection by Dr. Koleen Nimrod on 5/16 Acute on chronic respiratory failure with hypoxia-on NRB.  CXR with new LLL PNA.  High risk for VTE. Severe sepsis: Febrile, tachycardic, tachypnea with acute on chronic hypoxic respiratory failure -7/10-CTA chest concerning for alveolar hemorrhage, loculated fluid at right lung apex lined by masslike nodularity consistent with recurrence of malignancy and right peritracheal LAD -7/11-bronchoscopy on 7/11-no active hemorrhage -7/12-respiratory culture with normal respiratory flora -7/13-Rad/onc-started radiation.  Planned for 6 and half week -7/18-completed 7 days of IV cefepime. -7/19-repeat CTA chest without significant change, may be improved hemorrhage -7/21-started on azithromycin.   -7/23-started on Augmentin for dental abscess -7/24-prednisone resumed at 20 mg daily due to recurrence of hemoptysis -7/25-fever, increased O2 requirement, tachycardia and tachypnea.  CXR with new LLL infiltrate.  CTA chest negative for large central PE but with new gas in complex right apical collection concerning for communication with the airways, adjacent increase in bilateral airspace disease.  CTS consulted and recommended IR drain Blood and sputum cultures obtained and started on vancomycin and Zosyn.   -7/26-significant improvement.  Blood culture and MRSA PCR negative thus Vancomycin discontinued.  Drain was placed by IR -Per CTS, chest tube removed 8/1 with f/u CXR in AM    Uncontrolled cancer-related pain: Rates his  pain 6/10  today -Palliative medicine managing -On fentanyl patch 100 mcg, p.o. morphine 7.5 mg PRN and IV fentanyl PRN -Celebrex and gabapentin. -On scheduled and as needed Valium for anxiety -Narcan as needed -remains comfortable laying in bed, conversant    Dental pain/possible infection: Was on penicillin V outpatient.  Plan for root canal Tx on 7/11 but postponed. 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  Hyperglycemia partly due to steroid. -Continue SSI moderate as needed -NovoLog 4 units 3 times daily with meals -Lantus 10 units twice daily    History of anxiety/depression: -On Seroquel, Zoloft and Valium   GERD: -Continue on Protonix   DVT prophylaxis: SCD's Code Status: Full Family Communication: Pt in room, family at bedside  Status is: Inpatient  Remains inpatient appropriate because:Inpatient level of care appropriate due to severity of illness  Dispo: The patient is from: Home              Anticipated d/c is to: Home              Patient currently is not medically stable to d/c.   Difficult to place patient No   Consultants:  PCCM Cardiothoracic surgery IR Palliative medicine Oncology-signed off Radiation oncology Dental surgery-signed off  Procedures:  Drain placed by IR 7/26  Antimicrobials: Anti-infectives (From admission, onward)    Start     Dose/Rate Route Frequency Ordered Stop   04/06/21 1030  amoxicillin-clavulanate (AUGMENTIN) 875-125 MG per tablet 1 tablet        1 tablet Oral Every 12 hours 04/06/21 0955  04/13/21 0959   04/02/21 2100  vancomycin (VANCOREADY) IVPB 1500 mg/300 mL  Status:  Discontinued        1,500 mg 150 mL/hr over 120 Minutes Intravenous Every 12 hours 04/02/21 0740 04/03/21 1115   04/02/21 0900  vancomycin (VANCOREADY) IVPB 1500 mg/300 mL        1,500 mg 150 mL/hr over 120 Minutes Intravenous  Once 04/02/21 0724 04/02/21 1300   04/02/21 0800  piperacillin-tazobactam (ZOSYN) IVPB 3.375 g  Status:  Discontinued        3.375 g 12.5 mL/hr over 240 Minutes Intravenous Every 8 hours 04/02/21 0724 04/06/21 0955   03/30/21 1130  amoxicillin-clavulanate (AUGMENTIN) 875-125 MG per tablet 1 tablet  Status:  Discontinued        1 tablet Oral Every 12 hours 03/30/21 1035 04/02/21 0716   03/29/21 1200  azithromycin (ZITHROMAX) tablet 250 mg  Status:  Discontinued       Note to Pharmacy: For dental infection   250 mg Oral Daily 03/29/21 1100 04/03/21 1115   03/19/21 0900  vancomycin (VANCOREADY) IVPB 1250 mg/250 mL  Status:  Discontinued        1,250 mg 166.7 mL/hr over 90 Minutes Intravenous Every 12 hours 03/18/21 1842 03/20/21 1059   03/18/21 2200  ceFEPIme (MAXIPIME) 2 g in sodium chloride 0.9 % 100 mL IVPB        2 g 200 mL/hr over 30 Minutes Intravenous Every 8 hours 03/18/21 1836 03/26/21 0835   03/18/21 1930  vancomycin (VANCOREADY) IVPB 1500  mg/300 mL        1,500 mg 150 mL/hr over 120 Minutes Intravenous  Once 03/18/21 1836 03/19/21 0734   03/18/21 1915  azithromycin (ZITHROMAX) 500 mg in sodium chloride 0.9 % 250 mL IVPB  Status:  Discontinued        500 mg 250 mL/hr over 60 Minutes Intravenous Every 24 hours 03/18/21 1822 03/19/21 0840   03/18/21 1800  penicillin v potassium (VEETID) tablet 500 mg  Status:  Discontinued       Note to Pharmacy: Start date : 03/14/21     500 mg Oral 4 times daily 03/18/21 1527 03/18/21 1842       Subjective: Reports feeling better. Eager to go home soon  Objective: Vitals:   04/09/21 1400 04/09/21 1500 04/09/21 1620 04/09/21 1700   BP: 124/83 110/72  114/81  Pulse: (!) 108 (!) 102  96  Resp: 14 12  (!) 8  Temp:   98.8 F (37.1 C)   TempSrc:   Oral   SpO2: 95% 90%  92%  Weight:      Height:        Intake/Output Summary (Last 24 hours) at 04/09/2021 1848 Last data filed at 04/09/2021 1700 Gross per 24 hour  Intake 1923 ml  Output 2380 ml  Net -457 ml    Filed Weights   03/18/21 0242 03/25/21 1542 04/02/21 1330  Weight: 79.4 kg 81.7 kg 82.4 kg    Examination: General exam: Conversant, in no acute distress Respiratory system: normal chest rise, clear, no audible wheezing Cardiovascular system: regular rhythm, s1-s2 Gastrointestinal system: Nondistended, nontender, pos BS Central nervous system: No seizures, no tremors Extremities: No cyanosis, no joint deformities Skin: No rashes, no pallor Psychiatry: Affect normal // no auditory hallucinations   Data Reviewed: I have personally reviewed following labs and imaging studies  CBC: Recent Labs  Lab 04/04/21 0220 04/05/21 0241 04/06/21 0419 04/06/21 1944 04/07/21 0431 04/08/21 0444  WBC 8.8 10.6* 9.6  --  9.8 11.4*  NEUTROABS 7.3  --   --   --   --   --   HGB 7.4* 7.6* 6.9* 8.9* 8.1* 8.4*  HCT 24.5* 25.2* 22.0* 28.8* 26.3* 27.4*  MCV 96.8 98.1 96.5  --  97.0 97.5  PLT 233 261 246  --  267 509    Basic Metabolic Panel: Recent Labs  Lab 04/03/21 0230 04/05/21 0241 04/06/21 0419 04/07/21 0431  NA 137 138 139 139  K 3.7 4.4 3.8 3.9  CL 99 101 102 99  CO2 31 31 31  32  GLUCOSE 154* 137* 126* 110*  BUN 17 13 11 11   CREATININE 0.51* 0.60* 0.58* 0.53*  CALCIUM 8.4* 8.9 8.5* 9.1  MG 2.3  --   --   --   PHOS 3.2  --   --   --     GFR: Estimated Creatinine Clearance: 103.9 mL/min (A) (by C-G formula based on SCr of 0.53 mg/dL (L)). Liver Function Tests: Recent Labs  Lab 04/05/21 0241 04/06/21 0419 04/07/21 0431  AST 11* 10* 9*  ALT 19 18 18   ALKPHOS 64 61 60  BILITOT 0.4 0.5 0.5  PROT 5.7* 5.3* 5.9*  ALBUMIN 2.6* 2.4* 2.6*    No  results for input(s): LIPASE, AMYLASE in the last 168 hours. No results for input(s): AMMONIA in the last 168 hours. Coagulation Profile: Recent Labs  Lab 04/03/21 0230  INR 1.2    Cardiac Enzymes: No results for input(s): CKTOTAL, CKMB, CKMBINDEX, TROPONINI in  the last 168 hours. BNP (last 3 results) No results for input(s): PROBNP in the last 8760 hours. HbA1C: No results for input(s): HGBA1C in the last 72 hours. CBG: Recent Labs  Lab 04/08/21 1635 04/08/21 2231 04/09/21 0748 04/09/21 1134 04/09/21 1614  GLUCAP 198* 162* 178* 135* 210*    Lipid Profile: No results for input(s): CHOL, HDL, LDLCALC, TRIG, CHOLHDL, LDLDIRECT in the last 72 hours. Thyroid Function Tests: No results for input(s): TSH, T4TOTAL, FREET4, T3FREE, THYROIDAB in the last 72 hours. Anemia Panel: No results for input(s): VITAMINB12, FOLATE, FERRITIN, TIBC, IRON, RETICCTPCT in the last 72 hours. Sepsis Labs: No results for input(s): PROCALCITON, LATICACIDVEN in the last 168 hours.   Recent Results (from the past 240 hour(s))  Culture, blood (routine x 2)     Status: None   Collection Time: 04/02/21  8:02 AM   Specimen: BLOOD  Result Value Ref Range Status   Specimen Description   Final    BLOOD BLOOD LEFT FOREARM Performed at [facility] [address] [place], [place] [address]    Special Requests   Final    BOTTLES DRAWN AEROBIC AND ANAEROBIC Blood Culture adequate volume Performed at [facility] [address], [place], [place] [address]    Culture   Final    NO GROWTH 5 DAYS Performed at [facility], [place] [address] [place], [place] [address]    Report Status 04/07/2021 FINAL  Final  Culture, blood (routine x 2)     Status: None   Collection Time: 04/02/21  8:02 AM   Specimen: BLOOD  Result Value Ref Range Status   Specimen Description   Final    BLOOD RIGHT ANTECUBITAL Performed at [facility] [address] [place], [place] [address]    Special Requests   Final    BOTTLES DRAWN AEROBIC AND ANAEROBIC Blood Culture adequate volume Performed at [facility] [address] [place], [place] [address]    Culture   Final    NO GROWTH 5 DAYS Performed at [facility], [place] [address], [place], [place] [address]    Report Status 04/07/2021 FINAL  Final  MRSA Next Gen by PCR, Nasal     Status: None   Collection Time: 04/02/21  8:04 AM   Specimen: Nasal Mucosa; Nasal Swab  Result Value Ref Range Status   MRSA by PCR Next Gen NOT DETECTED NOT DETECTED Final    Comment: (NOTE) The GeneXpert MRSA Assay (FDA approved for NASAL specimens only), is one component of a comprehensive MRSA colonization surveillance program. It is not intended to diagnose MRSA infection nor to guide or monitor treatment for MRSA infections. Test performance is not FDA approved in patients less than 79 years old. Performed at [facility], [place] [address], [place], [place] [address]   Expectorated Sputum Assessment w Gram Stain, Rflx to Resp Cult     Status: None   Collection Time: 04/02/21 10:27 AM   Specimen: Sputum  Result Value Ref Range Status   Specimen Description SPUTUM  Final   Special Requests NONE  Final   Sputum evaluation   Final    THIS SPECIMEN IS ACCEPTABLE FOR SPUTUM CULTURE Performed at [facility], [place] [address] [place], [place] [address]    Report Status 04/03/2021 FINAL  Final  Culture, Respiratory w Gram Stain     Status: None   Collection Time: 04/02/21 10:27 AM  Specimen: SPU  Result Value Ref Range Status   Specimen Description   Final    SPUTUM Performed at [facility] [address] [place], [place] [address]    Special Requests   Final    NONE Reflexed from [id number] Performed at [facility] [address] [place], [place] [address]    Gram Stain   Final    RARE WBC  PRESENT,BOTH PMN AND MONONUCLEAR RARE GRAM POSITIVE COCCI IN CLUSTERS RARE GRAM NEGATIVE COCCOBACILLI    Culture   Final    MODERATE Normal respiratory flora-no Staph aureus or Pseudomonas seen Performed at [facility], [address], [place], [place] [address]    Report Status 04/05/2021 FINAL  Final  Body fluid culture w Gram Stain     Status: None   Collection Time: 04/04/21  4:20 PM   Specimen: Pleural Fluid  Result Value Ref Range Status   Specimen Description   Final    PLEURAL Performed at [facility] [address], [place], [place] [address]    Special Requests   Final    NONE Performed at [facility], [place] [address] [place], [place] [address]    Gram Stain   Final    RARE WBC PRESENT, PREDOMINANTLY MONONUCLEAR NO ORGANISMS SEEN    Culture   Final    NO GROWTH 3 DAYS Performed at [facility], [place] [address] [place], [place] [address]    Report Status 04/08/2021 FINAL  Final      Radiology Studies: CT CHEST WO CONTRAST  Result Date: 04/09/2021 CLINICAL DATA:  Evaluate right apical fluid collection following drain placement. Back pain. History of lung cancer with right upper lobectomy. EXAM: CT CHEST WITHOUT CONTRAST TECHNIQUE: Multidetector CT imaging of the chest was performed following the standard protocol without IV contrast. COMPARISON:  Chest CT 04/02/2021, 03/27/2021 and 03/18/2021 FINDINGS: Cardiovascular: Coronary artery atherosclerosis and aortic valvular calcifications are noted. No acute vascular findings are seen on noncontrast imaging. The heart size is normal. There is no pericardial effusion. Mediastinum/Nodes: There are no enlarged mediastinal, hilar or axillary lymph nodes. The thyroid gland, trachea and esophagus demonstrate no significant findings. Lungs/Pleura: Stable small to moderate dependent right pleural effusion status post right upper lobectomy. Interval placement of a percutaneous pigtail  catheter into the complex right apical collection. The superior component of this collection has mildly decreased in size, now measuring approximately 9.7 x 7.2 x 7.4 cm (previously 10.6 x 9.0 x 8.0 cm). This collection remains complex with high-density components and internal air. Complex inferolateral component containing air has not significantly changed, measuring up to 7.5 cm on image 65/2. There is no pneumothorax. The previously demonstrated patchy airspace opacities throughout lungs have partially cleared. Upper abdomen: The visualized upper abdomen appears stable without significant findings. Probable small sebaceous cyst in the upper back. Musculoskeletal/Chest wall: There is no chest wall mass or suspicious osseous finding. Mild bilateral gynecomastia. IMPRESSION: 1. Interval mild decrease in size of complex fluid collection superiorly in the right hemithorax following percutaneous drainage catheter placement. There is persistent gas within this collection. 2. Interval improvement in previously demonstrated patchy bilateral airspace opacities consistent with resolving hemorrhage or infection. 3. Stable small right pleural effusion.  No pneumothorax. 4. Coronary artery atherosclerosis. Electronically Signed   By: Richardean Sale M.D.   On: 04/09/2021 14:00   CT LUMBAR SPINE WO CONTRAST  Result Date: 04/09/2021 CLINICAL DATA:  Initial evaluation for  low back pain. History of lung cancer. EXAM: CT LUMBAR SPINE WITHOUT CONTRAST TECHNIQUE: Multidetector CT imaging of the lumbar spine was performed without intravenous contrast administration. Multiplanar CT image reconstructions were also generated. COMPARISON:  None available. FINDINGS: Segmentation: Standard. Lowest well-formed disc space labeled the L5-S1 level. Alignment: Trace retrolisthesis of T12 on L1 and L2 on L3. Underlying minimal sigmoid scoliotic curvature. Vertebrae: Vertebral body height maintained without acute or chronic fracture. Visualized  sacrum and pelvis intact. SI joints symmetric and normal. No discrete lytic or blastic osseous lesions to suggest osseous metastatic disease evident by CT. Subcentimeter sclerotic lesion at the posterior right iliac wing noted, most characteristic of a small benign bone island (series 2, image 134). Paraspinal and other soft tissues: Paraspinous soft tissues demonstrate no acute finding. 1.8 cm well-circumscribed hypodense lesion within the subcutaneous fat along the midline of the mid back at the level of T12-L1 most characteristic of a small sebaceous cyst/epidermal inclusion cyst (series 3, image 65). No other discrete soft tissue lesions. Small to moderate layering right pleural effusion partially visualized. Postoperative changes noted about the partially visualized colon. Mild aorto bi-iliac atherosclerotic disease. Visualized visceral structures otherwise unremarkable. Disc levels: T12-L1: Trace retrolisthesis. Degenerative intervertebral disc space narrowing with disc desiccation and mild disc bulge. Superimposed right extraforaminal disc protrusion (series 3, image 60). No spinal stenosis. Mild right foraminal narrowing. Left neural foramina remains patent. L1-2: Degenerative intervertebral disc space narrowing with diffuse disc bulge, eccentric to the right. Mild facet hypertrophy. No significant spinal stenosis. Moderate right worse than left L1 foraminal narrowing. L2-3: Degenerative intervertebral disc space narrowing with disc desiccation and diffuse disc bulge. Mild reactive endplate spurring. Mild facet hypertrophy. No spinal stenosis. Mild left L2 foraminal narrowing. Right neural foramen remains patent. L3-4: Degenerative intervertebral disc space narrowing with diffuse disc bulge and mild disc desiccation. Minimal reactive endplate spurring. Mild facet hypertrophy. Resultant mild spinal stenosis. Mild bilateral L3 foraminal narrowing. L4-5: Degenerative intervertebral disc space narrowing with  diffuse disc bulge and disc desiccation. Associated reactive endplate change with marginal endplate osteophytic spurring. Suspected superimposed small right foraminal disc protrusion (series 3, image 120). Mild to moderate facet hypertrophy. Mild canal with bilateral lateral recess stenosis. Moderate right worse than left L4 foraminal narrowing. L5-S1: Degenerative intervertebral disc space narrowing with diffuse disc bulge and disc desiccation. Associated reactive endplate spurring. Mild to moderate right worse than left facet hypertrophy. No significant spinal stenosis. Moderate bilateral L5 foraminal narrowing, right worse than left. IMPRESSION: 1. No acute abnormality within the lumbar spine. No CT evidence for osseous metastatic disease identified. If there is persistent clinical concern for possible occult metastatic disease, further assessment with dedicated MRI or bone scan would be recommended for further evaluation. 2. Moderate multilevel degenerative spondylolysis. Resultant mild spinal stenosis at L3-4 and L4-5, with mild-to-moderate multilevel foraminal narrowing as above, most pronounced at L4 and L5 bilaterally. 3. Small to moderate layering right pleural effusion, partially visualized. Electronically Signed   By: Jeannine Boga M.D.   On: 04/09/2021 02:42   CT PELVIS WO CONTRAST  Result Date: 04/09/2021 CLINICAL DATA:  Bilateral hip pain. Back pain. New onset pain limiting ability to walk. History of lung cancer. EXAM: CT PELVIS WITHOUT CONTRAST TECHNIQUE: Multidetector CT imaging of the pelvis was performed following the standard protocol without intravenous contrast. COMPARISON:  PET CT 12/13/2020 FINDINGS: Urinary Tract: Distal ureters are decompressed. Unremarkable urinary bladder. Bowel: Moderate stool in the included colon. No bowel inflammation. Vascular/Lymphatic: Iliac atherosclerosis.  No pelvic adenopathy.  Reproductive:  Unremarkable prostate gland. Other:  No pelvic free fluid.  Musculoskeletal: No blastic or destructive lytic lesions. No evidence of fracture. Both femoral heads are seated in the acetabulum with preservation of joint spaces. No visualized avascular necrosis. No periosteal reaction or bony destruction. No hip joint effusions. Minimal degenerative change of both sacroiliac joints. Lumbar spine assessed on concurrent lumbar spine CT. There are no intramuscular findings to suggest metastatic implants are disease. IMPRESSION: No findings of osseous metastatic disease on CT. No acute explanation for hip pain. If there is persistent clinical concern for bony metastasis, recommend further assessment with MRI or bone scan. Electronically Signed   By: Keith Rake M.D.   On: 04/09/2021 01:34   DG Chest Port 1 View  Result Date: 04/09/2021 CLINICAL DATA:  Chest tube, possible pleural effusion EXAM: PORTABLE CHEST 1 VIEW COMPARISON:  Portable exam 0507 hours compared to 04/07/2021 FINDINGS: Pigtail RIGHT thoracostomy tube at upper RIGHT hemithorax unchanged. Persistent pleuroparenchymal opacity at RIGHT apex as well as numerous surgical clips, unchanged. Subsegmental atelectasis mid RIGHT lung. No pneumothorax or basilar pleural effusion. LEFT lung clear. Heart size stable. IMPRESSION: No interval change in RIGHT apical opacity and pigtail thoracostomy tube. Electronically Signed   By: Lavonia Dana M.D.   On: 04/09/2021 08:15    Scheduled Meds:  (feeding supplement) PROSource Plus  30 mL Oral BID BM   amoxicillin-clavulanate  1 tablet Oral Q12H   benzonatate  200 mg Oral BID   celecoxib  200 mg Oral BID   Chlorhexidine Gluconate Cloth  6 each Topical Daily   dextrose       diazepam  1 mg Oral Q8H   feeding supplement  237 mL Oral BID BM   fentaNYL  1 patch Transdermal Q72H   ferrous sulfate  325 mg Oral Q breakfast   furosemide  40 mg Intravenous Once   gabapentin  300 mg Oral BID WC   gabapentin  600 mg Oral QHS   guaiFENesin  600 mg Oral BID   insulin aspart   0-15 Units Subcutaneous TID WC   insulin aspart  0-5 Units Subcutaneous QHS   insulin aspart  4 Units Subcutaneous TID WC   insulin glargine  10 Units Subcutaneous BID   irbesartan  75 mg Oral Daily   latanoprost  1 drop Both Eyes QHS   levalbuterol  0.63 mg Nebulization Q6H   lidocaine  1 patch Transdermal Q24H   multivitamin with minerals  1 tablet Oral Daily   pantoprazole  40 mg Oral Daily   predniSONE  20 mg Oral Q breakfast   QUEtiapine  100 mg Oral QHS   sertraline  100 mg Oral Daily   sodium chloride flush  3 mL Intravenous Q12H   Continuous Infusions:  tranexamic acid       LOS: 21 days   Marylu Lund, MD [facility] Pager On Amion  If 7PM-7AM, please contact night-coverage 04/09/2021, 6:48 PM

## 2021-04-09 NOTE — Plan of Care (Signed)
  Problem: Education: Goal: Knowledge of General Education information will improve Description: Including pain rating scale, medication(s)/side effects and non-pharmacologic comfort measures Outcome: Progressing   Problem: Health Behavior/Discharge Planning: Goal: Ability to manage health-related needs will improve Outcome: Progressing   Problem: Clinical Measurements: Goal: Ability to maintain clinical measurements within normal limits will improve Outcome: Progressing Goal: Will remain free from infection Outcome: Progressing Goal: Diagnostic test results will improve Outcome: Progressing Goal: Respiratory complications will improve Outcome: Progressing Goal: Cardiovascular complication will be avoided Outcome: Progressing   Problem: Activity: Goal: Risk for activity intolerance will decrease Outcome: Progressing   Problem: Nutrition: Goal: Adequate nutrition will be maintained Outcome: Progressing   Problem: Coping: Goal: Level of anxiety will decrease Outcome: Progressing   Problem: Elimination: Goal: Will not experience complications related to bowel motility Outcome: Progressing Goal: Will not experience complications related to urinary retention Outcome: Progressing   Problem: Pain Managment: Goal: General experience of comfort will improve Outcome: Progressing   Problem: Safety: Goal: Ability to remain free from injury will improve Outcome: Progressing   Problem: Skin Integrity: Goal: Risk for impaired skin integrity will decrease Outcome: Progressing   Problem: Education: Goal: Knowledge of General Education information will improve Description: Including pain rating scale, medication(s)/side effects and non-pharmacologic comfort measures Outcome: Progressing   Problem: Health Behavior/Discharge Planning: Goal: Ability to manage health-related needs will improve Outcome: Progressing   Problem: Clinical Measurements: Goal: Ability to maintain  clinical measurements within normal limits will improve Outcome: Progressing Goal: Will remain free from infection Outcome: Progressing Goal: Diagnostic test results will improve Outcome: Progressing Goal: Respiratory complications will improve Outcome: Progressing Goal: Cardiovascular complication will be avoided Outcome: Progressing   Problem: Activity: Goal: Risk for activity intolerance will decrease Outcome: Progressing   Problem: Nutrition: Goal: Adequate nutrition will be maintained Outcome: Progressing   Problem: Coping: Goal: Level of anxiety will decrease Outcome: Progressing   Problem: Elimination: Goal: Will not experience complications related to bowel motility Outcome: Progressing Goal: Will not experience complications related to urinary retention Outcome: Progressing   Problem: Pain Managment: Goal: General experience of comfort will improve Outcome: Progressing   Problem: Safety: Goal: Ability to remain free from injury will improve Outcome: Progressing   Problem: Skin Integrity: Goal: Risk for impaired skin integrity will decrease Outcome: Progressing   Problem: Activity: Goal: Ability to tolerate increased activity will improve Outcome: Progressing   Problem: Clinical Measurements: Goal: Ability to maintain a body temperature in the normal range will improve Outcome: Progressing   Problem: Respiratory: Goal: Ability to maintain adequate ventilation will improve Outcome: Progressing Goal: Ability to maintain a clear airway will improve Outcome: Progressing

## 2021-04-09 NOTE — Progress Notes (Signed)
Nutrition Follow-up  INTERVENTION:   -Prosource Plus PO BID, each provides 100 kcals and 15g protein  -Ensure Enlive po BID, each supplement provides 350 kcal and 20 grams of protein  -Multivitamin with minerals daily  NUTRITION DIAGNOSIS:   Increased nutrient needs related to cancer and cancer related treatments as evidenced by estimated needs.  Ongoing.  GOAL:   Patient will meet greater than or equal to 90% of their needs  Progressing.  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  7/26: s/p chest tube placement 8/1: chest tube removed  Patient currently consuming 75-100% of meals. Has been accepting Ensure and Prosource.   Admission weight: 174 lbs. Current weight: 181 lbs  Medications: Ferrous sulfate,  Multivitamin with minerals daily  Labs reviewed: CBGs: 135-178  Diet Order:   Diet Order             Diet regular Room service appropriate? Yes; Fluid consistency: Thin  Diet effective now                   EDUCATION NEEDS:   No education needs have been identified at this time  Skin:  Skin Assessment: Reviewed RN Assessment  Last BM:  7/18 (type 4)  Height:   Ht Readings from Last 1 Encounters:  03/25/21 5\' 10"  (1.778 m)    Weight:   Wt Readings from Last 1 Encounters:  04/02/21 82.4 kg    BMI:  Body mass index is 26.07 kg/m.  Estimated Nutritional Needs:   Kcal:  2400-2600  Protein:  120-130g  Fluid:  >2L   Clayton Bibles, MS, RD, LDN Inpatient Clinical Dietitian Contact information available via Am[employer]

## 2021-04-10 ENCOUNTER — Encounter: Payer: Self-pay | Admitting: Internal Medicine

## 2021-04-10 ENCOUNTER — Inpatient Hospital Stay (HOSPITAL_COMMUNITY): Payer: 59

## 2021-04-10 ENCOUNTER — Inpatient Hospital Stay: Payer: 59 | Attending: Internal Medicine

## 2021-04-10 ENCOUNTER — Other Ambulatory Visit: Payer: Self-pay

## 2021-04-10 ENCOUNTER — Inpatient Hospital Stay (HOSPITAL_BASED_OUTPATIENT_CLINIC_OR_DEPARTMENT_OTHER): Payer: 59 | Admitting: Internal Medicine

## 2021-04-10 ENCOUNTER — Ambulatory Visit
Admission: RE | Admit: 2021-04-10 | Discharge: 2021-04-10 | Disposition: A | Discharge status: Home / Self Care | Payer: 59 | Source: Ambulatory Visit | Attending: Radiation Oncology | Admitting: Radiation Oncology

## 2021-04-10 VITALS — BP 121/94 | HR 113 | Temp 98.4°F | Resp 20

## 2021-04-10 VITALS — BP 133/93 | HR 105 | Temp 98.3°F | Resp 18 | Wt 178.6 lb

## 2021-04-10 DIAGNOSIS — C3411 Malignant neoplasm of upper lobe, right bronchus or lung: Secondary | ICD-10-CM | POA: Diagnosis not present

## 2021-04-10 DIAGNOSIS — C3490 Malignant neoplasm of unspecified part of unspecified bronchus or lung: Secondary | ICD-10-CM

## 2021-04-10 DIAGNOSIS — Z4682 Encounter for fitting and adjustment of non-vascular catheter: Secondary | ICD-10-CM

## 2021-04-10 LAB — GLUCOSE, CAPILLARY: Glucose-Capillary: 167 mg/dL — ABNORMAL HIGH (ref 70–99)

## 2021-04-10 IMAGING — DX DG CHEST 2V
2 series · 2 of 2 positions shown · non-contrast
Comparison: [DATE].  CT [DATE].

CLINICAL DATA: Status post right chest tube removal.

EXAM:
CHEST - 2 VIEW

[chest lat]
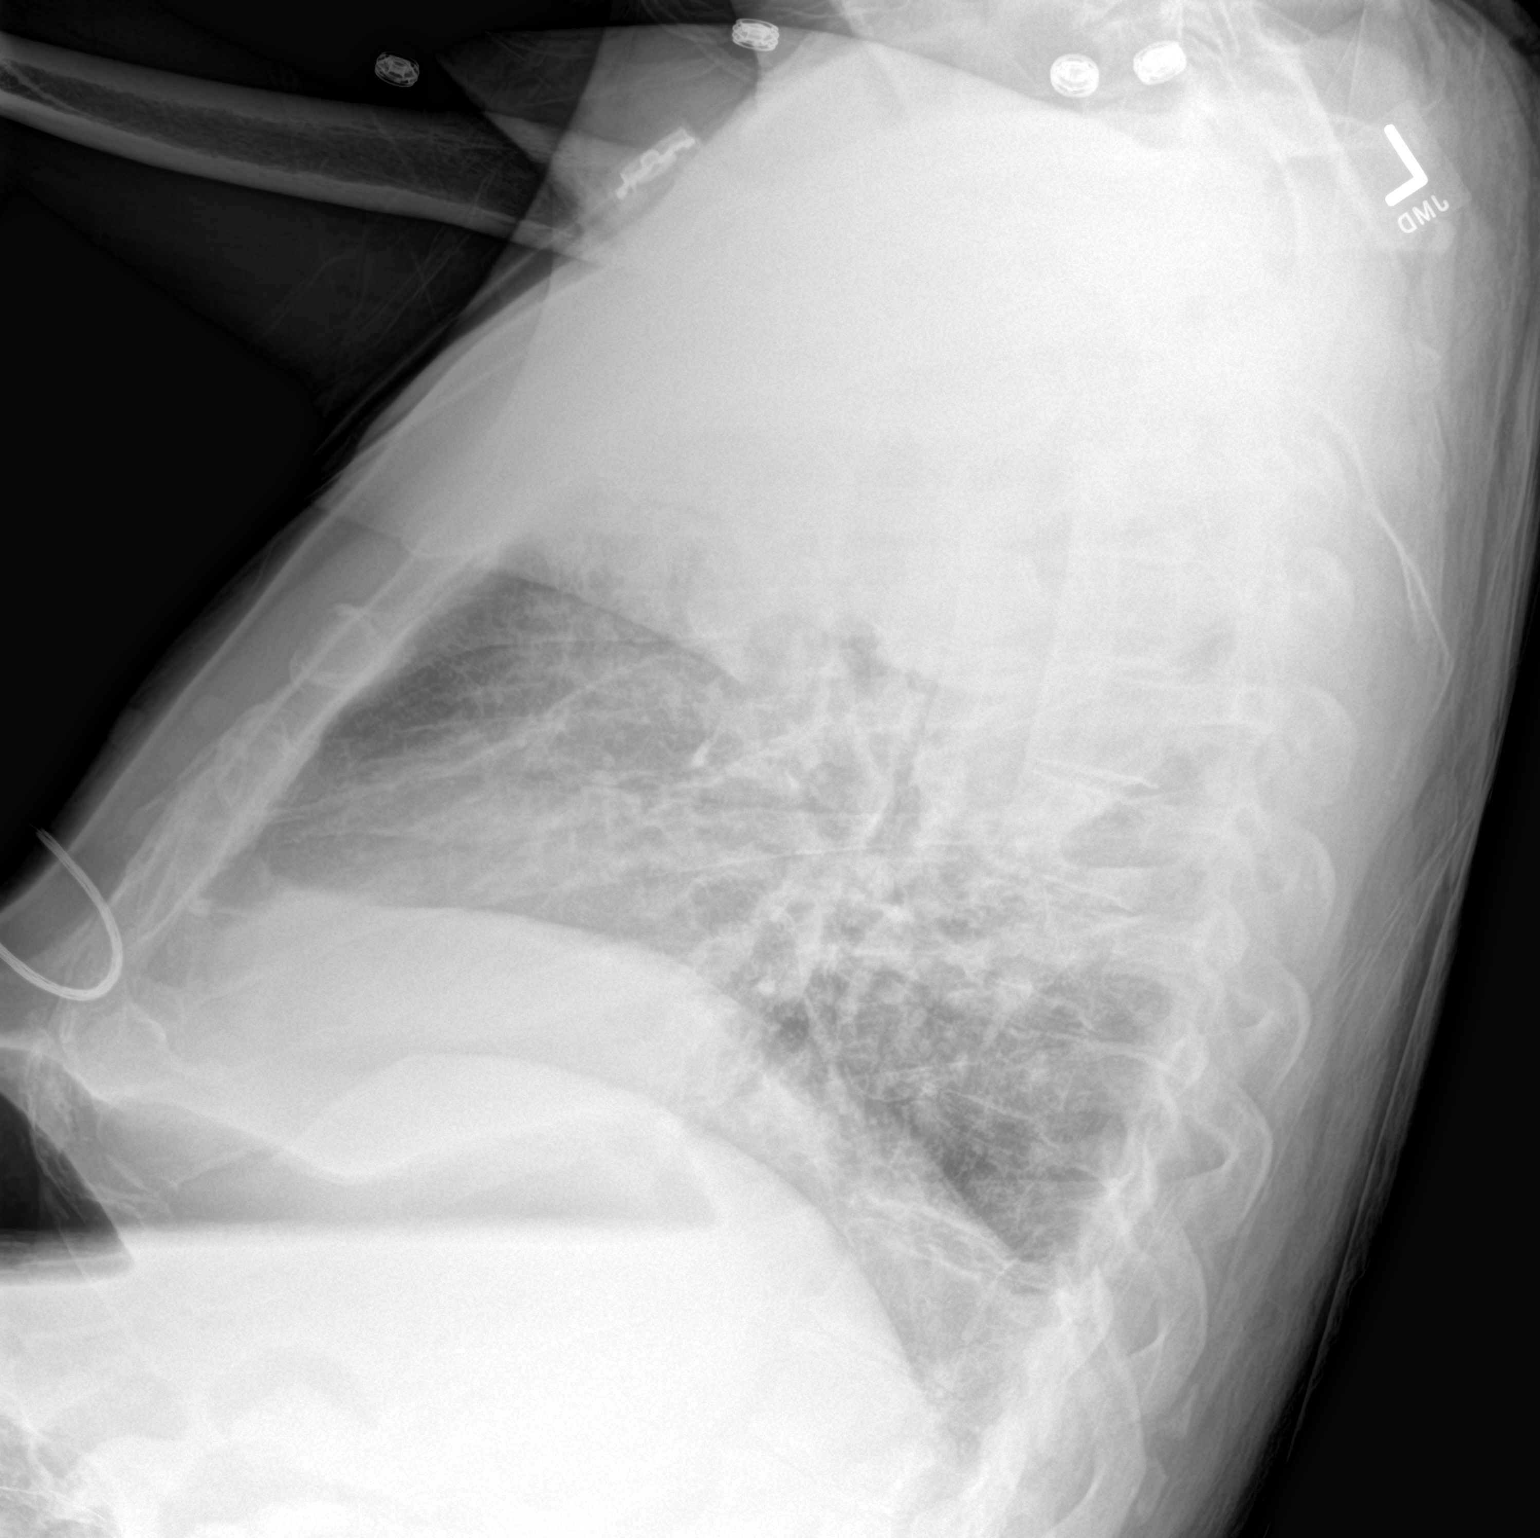

[chest ap]
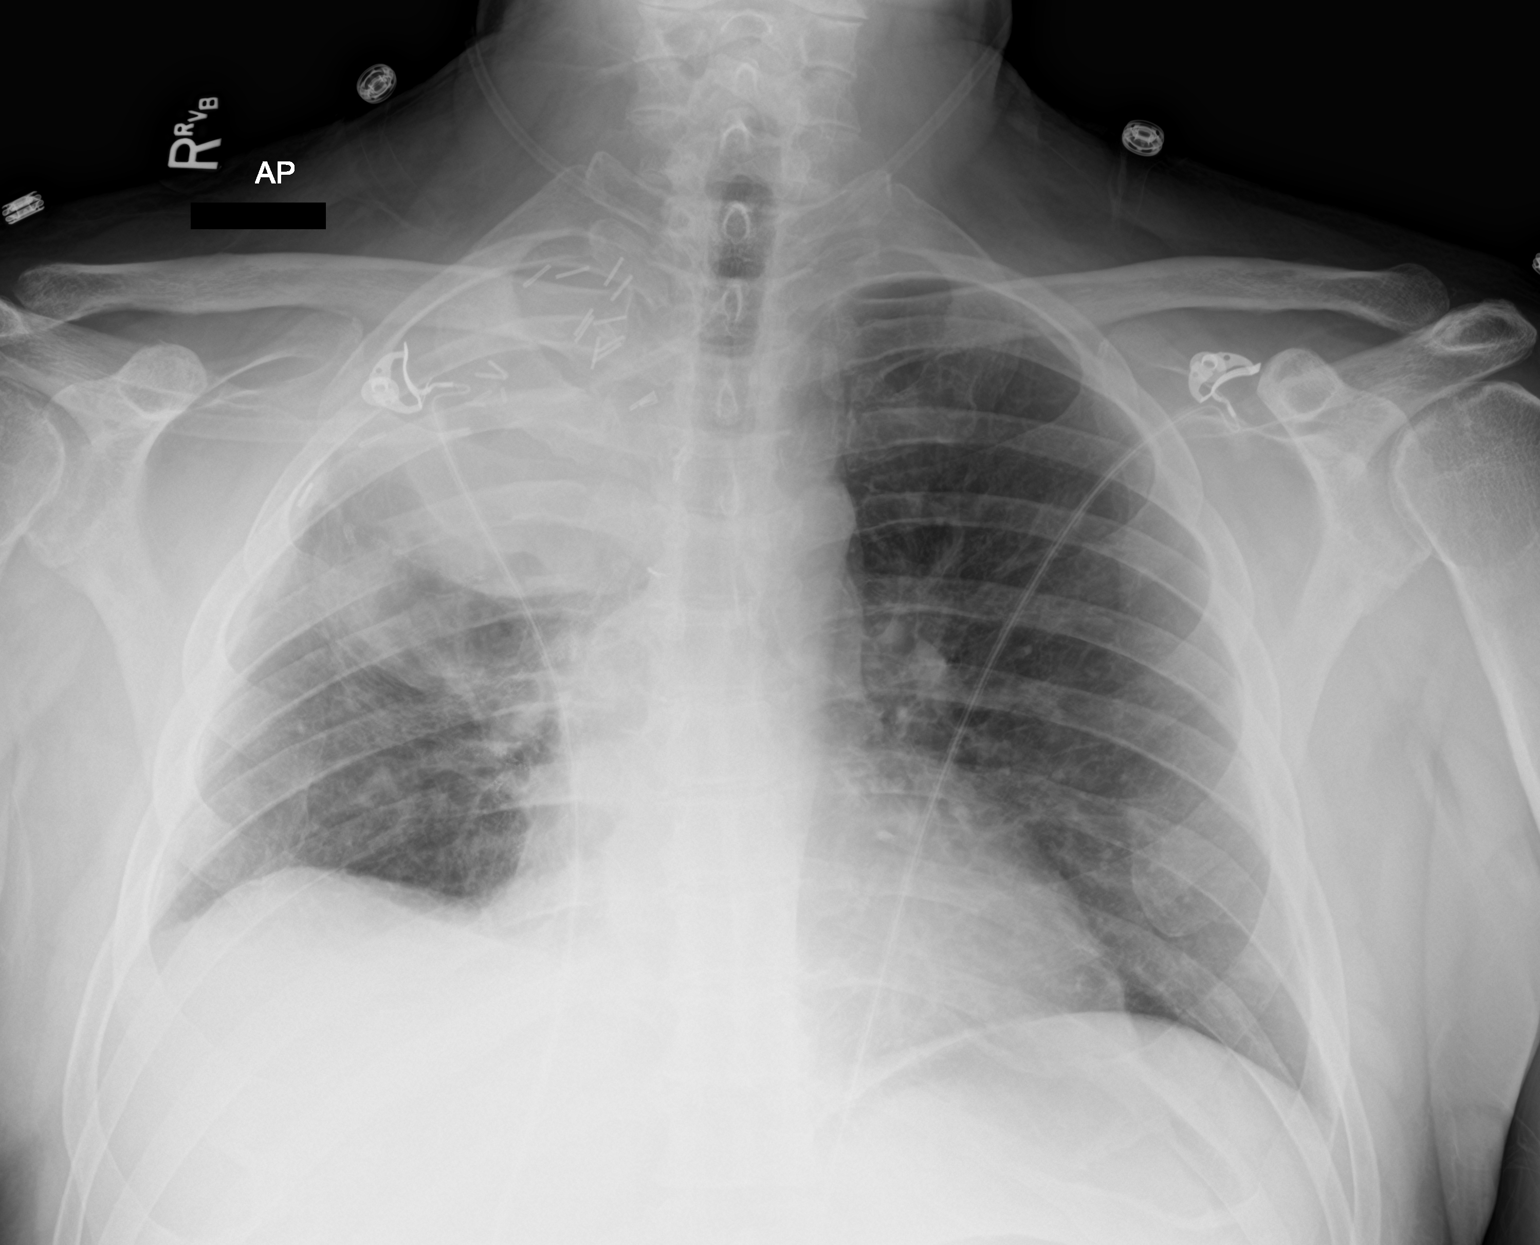

[2 of 2 positions shown; findings below may reference images not displayed]

FINDINGS: Surgical clips right upper chest. Stable density noted in the right
apex consistent previously identified complex fluid collection.
Stable adjacent atelectasis. Tiny right pleural effusion. No
pneumothorax.
IMPRESSION: Stable prominent density in the right apex consistent previously
identified complex fluid collection. Stable adjacent atelectasis.
Tiny right pleural effusion. No pneumothorax. Chest is unchanged
from prior exam.

## 2021-04-10 MED ORDER — IRBESARTAN 75 MG PO TABS: 75.0000 mg | ORAL_TABLET | Freq: Every day | ORAL | 0 refills | Status: DC

## 2021-04-10 MED ORDER — IPRATROPIUM-ALBUTEROL 0.5-2.5 (3) MG/3ML IN SOLN: 3.0000 mL | RESPIRATORY_TRACT | 6 refills | Status: AC | PRN

## 2021-04-10 MED ORDER — LEVALBUTEROL HCL 0.63 MG/3ML IN NEBU: 0.6300 mg | INHALATION_SOLUTION | Freq: Two times a day (BID) | RESPIRATORY_TRACT | Status: DC

## 2021-04-10 MED ORDER — BLOOD GLUCOSE MONITOR KIT: PACK | 0 refills | Status: DC

## 2021-04-10 MED ORDER — INSULIN PEN NEEDLE 31G X 5 MM MISC: 1.0000 | Freq: Two times a day (BID) | 0 refills | Status: DC

## 2021-04-10 MED ORDER — AMOXICILLIN-POT CLAVULANATE 875-125 MG PO TABS: 1.0000 | ORAL_TABLET | Freq: Two times a day (BID) | ORAL | 0 refills | Status: DC

## 2021-04-10 MED ORDER — PREDNISONE 20 MG PO TABS: 20.0000 mg | ORAL_TABLET | Freq: Every day | ORAL | 0 refills | Status: AC

## 2021-04-10 MED ORDER — INSULIN GLARGINE 100 UNIT/ML SOLOSTAR PEN: 10.0000 [IU] | PEN_INJECTOR | Freq: Two times a day (BID) | SUBCUTANEOUS | 0 refills | Status: DC

## 2021-04-10 MED ORDER — FERROUS SULFATE 325 (65 FE) MG PO TABS: 325.0000 mg | ORAL_TABLET | Freq: Every day | ORAL | 0 refills | Status: DC

## 2021-04-10 MED ORDER — MORPHINE SULFATE (PF) 4 MG/ML IV SOLN
2.0000 mg | Freq: Once | INTRAVENOUS | Status: AC
  Administered 2021-04-10: 2 mg via INTRAMUSCULAR
  Filled 2021-04-10: qty 0.5

## 2021-04-10 NOTE — Progress Notes (Signed)
[place] [phone]) [phone]   [phone]  OFFICE PROGRESS NOTE  [name], MD [place] [address]  DIAGNOSIS: Stage IIIA (T4, N0, M0) non-small cell lung cancer, adenocarcinoma presented with large right upper lobe pulmonary mass diagnosed in April 2022.  Biomarker Findings Microsatellite status - MS-Stable Tumor Mutational Burden - 0 Muts/Mb Genomic Findings For a complete list of the genes assayed, please refer to the Appendix. MET exon 14 splice site (0109+3A>T) BRCA1 splice site 5573-2K>G KEAP1 [id number] - subclonal? TERT promoter -124C>T TP53 [id number]*63 7 Disease relevant genes with no reportable alterations: ALK, BRAF, EGFR, ERBB2, KRAS, RET, ROS1  PDL1 Expression: 90%  PRIOR THERAPY: status post right thoracotomy with extrapleural right upper lobectomy and lymph node dissection as well as intercostal nerve blocks under the care of Dr. Roxan Hockey on 01/22/2021.  The final pathology was consistent with poorly differentiated adenocarcinoma spanning 9.0 cm with tumor invading the pleura and bronchus but the vascular and bronchial margins were negative.  The dissected lymph nodes were negative for malignancy.  CURRENT THERAPY: Adjuvant radiotherapy to the positive bronchial and pleural margins under the care of Dr. Lisbeth Renshaw.  He is expected to complete this course on May 07, 2021.  INTERVAL HISTORY: Christopher Carter 60 y.o. male returns to the clinic today for follow-up visit accompanied by his wife.  The patient underwent right thoracotomy with extrapleural right upper lobe lobectomy and lymph node dissection under the care of Dr. Roxan Hockey on Jan 22, 2021.  The final pathology was consistent with poorly differentiated adenocarcinoma spanning 9.0 cm with tumor invading the pleura and the bronchus and the dissected lymph nodes were negative for malignancy.  He had a very complicated postoperative course with  significant right-sided chest pain as well as recurrent pneumonia and pleural effusion.  He was recently discharged from the hospital and started palliative radiotherapy under the care of Dr. Lisbeth Renshaw.  His right-sided chest pain has significantly improved.  He has no significant shortness of breath except with exertion with no cough or hemoptysis.  He lost a lot of weight but he started gaining it back.  He denied having any nausea, vomiting, diarrhea or constipation.  He has no headache or visual changes.  He is here today for evaluation and discussion of his treatment options.   MEDICAL HISTORY: 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   Anxiety    Depression    Hypertension    Pneumonia    1990's    ALLERGIES:  is allergic to oxycodone hcl and bupropion.  MEDICATIONS:  Current Outpatient Medications  Medication Sig Dispense Refill   acetaminophen (TYLENOL) 500 MG tablet Take 1,000 mg by mouth every 8 (eight) hours as needed for moderate pain or headache.     amoxicillin-clavulanate (AUGMENTIN) 875-125 MG tablet Take 1 tablet by mouth every 12 (twelve) hours for 14 days. 28 tablet 0   Ascorbic Acid (VITAMIN C) 1000 MG tablet Take 1,000 mg by mouth daily.     benzonatate (TESSALON) 200 MG capsule Take 1 capsule (200 mg total) by mouth 2 (two) times daily. 20 capsule 0   blood glucose meter kit and supplies KIT Dispense based on patient and insurance preference. Use up to four times daily as directed. 1 each 0   celecoxib (CELEBREX) 200 MG capsule Take 1 capsule (200 mg total) by mouth 2 (two) times daily. 60 capsule 3   diazepam (VALIUM) 10  MG tablet Take 0.5-1 tablets (5-10 mg total) by mouth daily as needed for anxiety (30 minutes PRIOR to Radiation). 30 tablet 3   diazepam (VALIUM) 2 MG tablet Take 0.5 tablets (1 mg total) by mouth 3 (three) times daily. 45 tablet 0   fentaNYL (DURAGESIC) 100 MCG/HR Place 1 patch onto the skin every 3 (three) days. 10 patch 0    [START ON 04/11/2021] ferrous sulfate 325 (65 FE) MG tablet Take 1 tablet (325 mg total) by mouth daily with breakfast. 30 tablet 0   gabapentin (NEURONTIN) 300 MG capsule Take 300-600 mg by mouth See admin instructions. Takes 300 mg in the morning and afternoon and 600 mg at night     insulin glargine (LANTUS) 100 UNIT/ML Solostar Pen Inject 10 Units into the skin 2 (two) times daily. 3 mL 0   Insulin Pen Needle 31G X 5 MM MISC 1 Device by Does not apply route in the morning and at bedtime. For use with insulin pen 100 each 0   ipratropium-albuterol (DUONEB) 0.5-2.5 (3) MG/3ML SOLN Take 3 mLs by nebulization every 4 (four) hours as needed. 360 mL 6   [START ON 04/11/2021] irbesartan (AVAPRO) 75 MG tablet Take 1 tablet (75 mg total) by mouth daily. 30 tablet 0   latanoprost (XALATAN) 0.005 % ophthalmic solution Place 1 drop into both eyes at bedtime.     lidocaine (LIDODERM) 5 % Place 1 patch onto the skin daily. Remove & Discard patch within 12 hours or as directed by MD 30 patch 0   morphine (MSIR) 15 MG tablet Take 1 tablet (15 mg total) by mouth every 4 (four) hours as needed for severe pain. 120 tablet 0   pantoprazole (PROTONIX) 40 MG tablet Take 1 tablet (40 mg total) by mouth daily. 30 tablet 0   polyethylene glycol (MIRALAX / GLYCOLAX) 17 g packet Take 17 g by mouth daily as needed for mild constipation. 14 each 0   [START ON 04/11/2021] predniSONE (DELTASONE) 20 MG tablet Take 1 tablet (20 mg total) by mouth daily with breakfast for 4 days. 4 tablet 0   QUEtiapine (SEROQUEL) 50 MG tablet Take 100 mg by mouth at bedtime.     sertraline (ZOLOFT) 100 MG tablet Take 100 mg by mouth daily.     vitamin B-12 (CYANOCOBALAMIN) 1000 MCG tablet Take 1,000 mcg by mouth daily.     No current facility-administered medications for this visit.   Facility-Administered Medications Ordered in Other Visits  Medication Dose Route Frequency Provider Last Rate Last Admin   (feeding supplement) PROSource Plus liquid  30 mL  30 mL Oral BID BM Gonfa, Taye T, MD   30 mL at 04/10/21 1209   acetaminophen (TYLENOL) suppository 650 mg  650 mg Rectal Q8H PRN Blount, Scarlette Shorts T, NP       acetaminophen (TYLENOL) tablet 1,000 mg  1,000 mg Oral Q8H PRN Fuller Plan A, MD   1,000 mg at 04/02/21 0539   amoxicillin-clavulanate (AUGMENTIN) 875-125 MG per tablet 1 tablet  1 tablet Oral Q12H Donne Hazel, MD   1 tablet at 04/10/21 1017   benzonatate (TESSALON) capsule 200 mg  200 mg Oral BID Lane Hacker L, DO   200 mg at 04/10/21 5102   celecoxib (CELEBREX) capsule 200 mg  200 mg Oral BID Lane Hacker L, DO   200 mg at 04/10/21 5852   diazepam (VALIUM) tablet 1 mg  1 mg Oral Q8H Loistine Chance, MD   1 mg at 04/10/21  0555   diazepam (VALIUM) tablet 10 mg  10 mg Oral Daily PRN Lane Hacker L, DO   10 mg at 04/06/21 1428   diphenhydrAMINE (BENADRYL) injection 12.5 mg  12.5 mg Intravenous Q6H PRN Norval Morton, MD       Or   diphenhydrAMINE (BENADRYL) 12.5 MG/5ML elixir 12.5 mg  12.5 mg Oral Q6H PRN Smith, Rondell A, MD       feeding supplement (ENSURE ENLIVE / ENSURE PLUS) liquid 237 mL  237 mL Oral BID BM Gonfa, Taye T, MD   237 mL at 04/09/21 1653   fentaNYL (DURAGESIC) 100 MCG/HR 1 patch  1 patch Transdermal Q72H Loistine Chance, MD   1 patch at 04/09/21 0912   fentaNYL (SUBLIMAZE) injection 50 mcg  50 mcg Intravenous Daily PRN Lane Hacker L, DO   50 mcg at 04/09/21 1403   fentaNYL (SUBLIMAZE) injection 50-75 mcg  50-75 mcg Intravenous Q1H PRN Lane Hacker L, DO   50 mcg at 04/10/21 1950   ferrous sulfate tablet 325 mg  325 mg Oral Q breakfast Shelly Coss, MD   325 mg at 04/10/21 0555   furosemide (LASIX) injection 40 mg  40 mg Intravenous Once Lovey Newcomer T, NP       gabapentin (NEURONTIN) capsule 300 mg  300 mg Oral BID WC Smith, Rondell A, MD   300 mg at 04/10/21 1208   gabapentin (NEURONTIN) capsule 600 mg  600 mg Oral QHS Smith, Rondell A, MD   600 mg at 04/09/21 2213   guaiFENesin  (MUCINEX) 12 hr tablet 600 mg  600 mg Oral BID Shelly Coss, MD   600 mg at 04/10/21 0823   insulin glargine (LANTUS) injection 10 Units  10 Units Subcutaneous BID Wendee Beavers T, MD   10 Units at 04/10/21 0824   irbesartan (AVAPRO) tablet 75 mg  75 mg Oral Daily Wendee Beavers T, MD   75 mg at 04/10/21 0824   latanoprost (XALATAN) 0.005 % ophthalmic solution 1 drop  1 drop Both Eyes QHS Smith, Rondell A, MD   1 drop at 04/09/21 2215   levalbuterol (XOPENEX) nebulizer solution 0.63 mg  0.63 mg Nebulization Q4H PRN Donne Hazel, MD       levalbuterol Penne Lash) nebulizer solution 0.63 mg  0.63 mg Nebulization BID Donne Hazel, MD       lidocaine (LIDODERM) 5 % 1 patch  1 patch Transdermal Q24H Fuller Plan A, MD   1 patch at 04/09/21 1654   morphine (MSIR) tablet 15 mg  15 mg Oral Q4H PRN Lane Hacker L, DO   15 mg at 04/10/21 1208   multivitamin with minerals tablet 1 tablet  1 tablet Oral Daily Shelly Coss, MD   1 tablet at 04/10/21 0823   naloxone [facility]) injection 0.4 mg  0.4 mg Intravenous PRN Fuller Plan A, MD       And   sodium chloride flush (NS) 0.9 % injection 9 mL  9 mL Intravenous PRN Fuller Plan A, MD       ondansetron (ZOFRAN) tablet 4 mg  4 mg Oral Q6H PRN Fuller Plan A, MD       Or   ondansetron (ZOFRAN) injection 4 mg  4 mg Intravenous Q6H PRN Smith, Rondell A, MD       ondansetron (ZOFRAN) injection 4 mg  4 mg Intravenous Q6H PRN Smith, Rondell A, MD       pantoprazole (PROTONIX) EC tablet 40 mg  40 mg Oral  Daily Fuller Plan A, MD   40 mg at 04/10/21 3710   polyethylene glycol (MIRALAX / GLYCOLAX) packet 17 g  17 g Oral Daily PRN Fuller Plan A, MD       predniSONE (DELTASONE) tablet 20 mg  20 mg Oral Q breakfast Olalere, Adewale A, MD   20 mg at 04/10/21 6269   QUEtiapine (SEROQUEL) tablet 100 mg  100 mg Oral QHS Smith, Rondell A, MD   100 mg at 04/09/21 2213   sertraline (ZOLOFT) tablet 100 mg  100 mg Oral Daily Smith, Rondell A, MD   100 mg at  04/10/21 4854   sodium chloride flush (NS) 0.9 % injection 3 mL  3 mL Intravenous Q12H Smith, Rondell A, MD   3 mL at 04/10/21 0825   tranexamic acid (CYKLOKAPRON) IVPB 1,000 mg  1,000 mg Intravenous Once [name], DO        SURGICAL HISTORY:  Past Surgical History:  Procedure Laterality Date   ANKLE FRACTURE SURGERY Right    BRONCHIAL BIOPSY  01/05/2021   Procedure: BRONCHIAL BIOPSIES;  Surgeon: Collene Gobble, MD;  Location: [facility];  Service: Cardiopulmonary;;   BRONCHIAL BRUSHINGS  01/05/2021   Procedure: BRONCHIAL BRUSHINGS;  Surgeon: Collene Gobble, MD;  Location: [place];  Service: Cardiopulmonary;;   BRONCHIAL NEEDLE ASPIRATION BIOPSY  01/05/2021   Procedure: BRONCHIAL NEEDLE ASPIRATION BIOPSIES;  Surgeon: Collene Gobble, MD;  Location: [place];  Service: Cardiopulmonary;;   COLON SURGERY  2017   bowel obstruction surgery per pt    ENDOBRONCHIAL ULTRASOUND N/A 01/05/2021   Procedure: ENDOBRONCHIAL ULTRASOUND;  Surgeon: Collene Gobble, MD;  Location: [place];  Service: Cardiopulmonary;  Laterality: N/A;   INTERCOSTAL NERVE BLOCK Right 01/22/2021   Procedure: INTERCOSTAL NERVE BLOCK;  Surgeon: [name], MD;  Location: [facility];  Service: Thoracic;  Laterality: Right;   LAPAROSCOPY N/A 04/25/2017   Procedure: LAPAROSCOPY ASSISTED ILEO-CECECTOMY WITH SMALL BOWEL RESECTION WITH ANASTOMOSIS;  Surgeon: Excell Seltzer, MD;  Location: [facility];  Service: General;  Laterality: N/A;   LYMPH NODE DISSECTION Right 01/22/2021   Procedure: LYMPH NODE DISSECTION;  Surgeon: [name], MD;  Location: [place] [facility];  Service: Thoracic;  Laterality: Right;   THORACOTOMY Right 01/22/2021   Procedure: THORACOTOMY;  Surgeon: [name], MD;  Location: [place];  Service: Thoracic;  Laterality: Right;   VIDEO ASSISTED THORACOSCOPY (VATS)/ LOBECTOMY Right 01/22/2021   Procedure: VIDEO ASSISTED THORACOSCOPY (VATS)/RIGHT UPPER LOBECTOMY;  Surgeon: [name], MD;  Location: [facility];  Service: Thoracic;  Laterality: Right;   VIDEO BRONCHOSCOPY N/A 01/05/2021   Procedure: VIDEO BRONCHOSCOPY WITH FLUORO;  Surgeon: Collene Gobble, MD;  Location: [facility];  Service: Cardiopulmonary;  Laterality: N/A;   VIDEO BRONCHOSCOPY N/A 03/19/2021   Procedure: VIDEO BRONCHOSCOPY WITHOUT FLUORO;  Surgeon: [name], DO;  Location: [facility];  Service: Endoscopy;  Laterality: N/A;  need cryo probe available    REVIEW OF SYSTEMS:  Constitutional: positive for fatigue and weight loss Eyes: negative Ears, nose, mouth, throat, and face: negative Respiratory: positive for pleurisy/chest pain Cardiovascular: negative Gastrointestinal: negative Genitourinary:negative Integument/breast: negative Hematologic/lymphatic: negative Musculoskeletal:negative Neurological: negative Behavioral/Psych: negative Endocrine: negative Allergic/Immunologic: negative   PHYSICAL EXAMINATION: General appearance: alert, cooperative, fatigued, and no distress Head: Normocephalic, without obvious abnormality, atraumatic Neck: no adenopathy, no JVD, supple, symmetrical, trachea midline, and thyroid not enlarged, symmetric, no tenderness/mass/nodules Lymph nodes: Cervical, supraclavicular, and axillary nodes normal. Resp: clear to auscultation bilaterally Back: symmetric,  no curvature. ROM normal. No CVA tenderness. Cardio: regular rate and rhythm, S1, S2 normal, no murmur, click, rub or gallop GI: soft, non-tender; bowel sounds normal; no masses,  no organomegaly Extremities: extremities normal, atraumatic, no cyanosis or edema Neurologic: Alert and oriented X 3, normal strength and tone. Normal symmetric reflexes. Normal coordination and gait  ECOG PERFORMANCE STATUS: 1 - Symptomatic but completely ambulatory  Blood pressure (!) 133/93, pulse (!) 105, temperature 98.3 F (36.8 C), temperature source Oral, resp. rate 18, weight 178 lb 9.6 oz (81 kg), SpO2 93  %.  LABORATORY DATA: Lab Results  Component Value Date   WBC 11.4 (H) 04/08/2021   HGB 8.4 (L) 04/08/2021   HCT 27.4 (L) 04/08/2021   MCV 97.5 04/08/2021   PLT 290 04/08/2021      Chemistry      Component Value Date/Time   NA 139 04/07/2021 0431   K 3.9 04/07/2021 0431   CL 99 04/07/2021 0431   CO2 32 04/07/2021 0431   BUN 11 04/07/2021 0431   CREATININE 0.53 (L) 04/07/2021 0431   CREATININE 0.78 01/09/2021 1525      Component Value Date/Time   CALCIUM 9.1 04/07/2021 0431   ALKPHOS 60 04/07/2021 0431   AST 9 (L) 04/07/2021 0431   AST 20 01/09/2021 1525   ALT 18 04/07/2021 0431   ALT 24 01/09/2021 1525   BILITOT 0.5 04/07/2021 0431   BILITOT <0.1 (L) 01/09/2021 1525       RADIOGRAPHIC STUDIES: DG Chest 2 View  Result Date: 04/10/2021 CLINICAL DATA:  Status post right chest tube removal. EXAM: CHEST - 2 VIEW COMPARISON:  04/09/2021.  CT 04/09/2021. FINDINGS: Surgical clips right upper chest. Stable density noted in the right apex consistent previously identified complex fluid collection. Stable adjacent atelectasis. Tiny right pleural effusion. No pneumothorax. IMPRESSION: Stable prominent density in the right apex consistent previously identified complex fluid collection. Stable adjacent atelectasis. Tiny right pleural effusion. No pneumothorax. Chest is unchanged from prior exam. Electronically Signed   By: Marcello Moores  Register   On: 04/10/2021 11:26   DG Chest 2 View  Result Date: 03/15/2021 CLINICAL DATA:  Provided history: Chest pain. Additional history provided: Hemoptysis and pain after lobectomy for lung cancer. Shortness of breath, posterior right upper chest pain. EXAM: CHEST - 2 VIEW COMPARISON:  Prior chest radiograph 03/13/2021 and earlier. FINDINGS: Heart size within normal limits. Redemonstrated postsurgical changes within the right lung apex. Ill-defined airspace opacity within the right mid lung field, new as compared to the chest radiograph of 03/13/2021. The left  lung remains clear. Persistent pleural thickening or trace pleural effusion at the right lung base. No evidence of pneumothorax. No acute bony abnormality identified. IMPRESSION: Nonspecific airspace disease within the right mid lung field, new from the chest radiographs of 03/13/2021. Primary consideration include pneumonia, atelectasis or hemorrhage. Redemonstrated postsurgical changes within the right lung apex. Persistent pleural thickening or trace pleural effusion at the right lung base. Electronically Signed   By: Kellie Simmering DO   On: 03/15/2021 13:44   DG Chest 2 View  Result Date: 03/13/2021 CLINICAL DATA:  Status post video assisted thorascopic surgery. EXAM: CHEST - 2 VIEW COMPARISON:  March 06, 2021. FINDINGS: The heart size and mediastinal contours are within normal limits. Left lung is clear. Stable postsurgical changes are noted in the right lung apex. Minimal right basilar subsegmental atelectasis or scarring is noted with small right pleural effusion. The visualized skeletal structures are unremarkable. IMPRESSION: Stable postsurgical changes seen  in right lung apex. Minimal right basilar subsegmental atelectasis or scarring is noted with probable small right pleural effusion. Electronically Signed   By: Marijo Conception M.D.   On: 03/13/2021 14:43   CT CHEST WO CONTRAST  Result Date: 04/09/2021 CLINICAL DATA:  Evaluate right apical fluid collection following drain placement. Back pain. History of lung cancer with right upper lobectomy. EXAM: CT CHEST WITHOUT CONTRAST TECHNIQUE: Multidetector CT imaging of the chest was performed following the standard protocol without IV contrast. COMPARISON:  Chest CT 04/02/2021, 03/27/2021 and 03/18/2021 FINDINGS: Cardiovascular: Coronary artery atherosclerosis and aortic valvular calcifications are noted. No acute vascular findings are seen on noncontrast imaging. The heart size is normal. There is no pericardial effusion. Mediastinum/Nodes: There are no  enlarged mediastinal, hilar or axillary lymph nodes. The thyroid gland, trachea and esophagus demonstrate no significant findings. Lungs/Pleura: Stable small to moderate dependent right pleural effusion status post right upper lobectomy. Interval placement of a percutaneous pigtail catheter into the complex right apical collection. The superior component of this collection has mildly decreased in size, now measuring approximately 9.7 x 7.2 x 7.4 cm (previously 10.6 x 9.0 x 8.0 cm). This collection remains complex with high-density components and internal air. Complex inferolateral component containing air has not significantly changed, measuring up to 7.5 cm on image 65/2. There is no pneumothorax. The previously demonstrated patchy airspace opacities throughout lungs have partially cleared. Upper abdomen: The visualized upper abdomen appears stable without significant findings. Probable small sebaceous cyst in the upper back. Musculoskeletal/Chest wall: There is no chest wall mass or suspicious osseous finding. Mild bilateral gynecomastia. IMPRESSION: 1. Interval mild decrease in size of complex fluid collection superiorly in the right hemithorax following percutaneous drainage catheter placement. There is persistent gas within this collection. 2. Interval improvement in previously demonstrated patchy bilateral airspace opacities consistent with resolving hemorrhage or infection. 3. Stable small right pleural effusion.  No pneumothorax. 4. Coronary artery atherosclerosis. Electronically Signed   By: Richardean Sale M.D.   On: 04/09/2021 14:00   CT CHEST W CONTRAST  Result Date: 03/27/2021 CLINICAL DATA:  Persistent chest pain. History of right upper lobectomy and recent CT suspicious for recurrence. EXAM: CT CHEST WITH CONTRAST TECHNIQUE: Multidetector CT imaging of the chest was performed during intravenous contrast administration. CONTRAST:  53mL OMNIPAQUE IOHEXOL 350 MG/ML SOLN COMPARISON:  Multiple recent  radiographs.  Chest CT 9 days ago. FINDINGS: Cardiovascular: Normal caliber thoracic aorta. Mild atherosclerosis. Coronary artery calcifications. Normal heart size. No pericardial effusion. No obvious pulmonary embolus on this non angiographic exam. Mediastinum/Nodes: There are enlarged right lower paratracheal nodes measuring 16 and 17 mm, unchanged allowing for differences in caliper placement. Additional smaller mediastinal lymph nodes are similar. No thyroid nodule. No esophageal wall thickening. Lungs/Pleura: History of right upper lobectomy. Loculated collection at the right lung apex lined by thick masslike nodularity. This is not significantly changed in the interim. This again measures at least 10 cm ostial dimension. There scattered calcifications. There is surrounding ground-glass opacity extending into the adjacent right lung with improvement from prior exam. Small right pleural effusion is similar there is a posterior pleural based nodule in the right hemithorax measuring 17 mm, series 2, image 46. Upper Abdomen: No acute findings. Musculoskeletal: Postsurgical change of right posterior upper ribs. There is no frank bony destruction related to right apical masslike opacity. No discrete osseous lesions. No definite chest wall extension of apical lesion. IMPRESSION: 1. No significant change in loculated collection at the right lung apex  lined by thick masslike nodularity, suspicious for malignancy recurrence. 2. Mild improvement in surrounding ground-glass opacity in the adjacent right lung, likely representing improving hemorrhage. 3. Unchanged small right pleural effusion. Unchanged pleural based nodule in the posterior right hemithorax. 4. Enlarged right lower paratracheal lymph nodes are similar to recent prior. 5. Coronary artery calcifications. Aortic Atherosclerosis (ICD10-I70.0). Electronically Signed   By: Keith Rake M.D.   On: 03/27/2021 19:58   CT Chest W Contrast  Result Date:  03/18/2021 CLINICAL DATA:  Hemoptysis.  Recent diagnosis of lung cancer EXAM: CT CHEST WITH CONTRAST TECHNIQUE: Multidetector CT imaging of the chest was performed during intravenous contrast administration. CONTRAST:  15mL OMNIPAQUE IOHEXOL 300 MG/ML  SOLN COMPARISON:  02/26/2021 FINDINGS: Cardiovascular: Normal heart size. No pericardial effusion. Extensive coronary calcification. No acute vascular finding. Mediastinum/Nodes: Tubular centrally low-density structure along the upper right mediastinum is contiguous with a complex right apical mass and collection. There is right paratracheal nodule measuring 17 mm in diameter, suspicious for metastatic node. Lungs/Pleura: History of right upper lobectomy for cancer. There is a recently drained and re-accumulated loculated collection at the right apex lined by thick masslike nodularity, up to 10 cm on axial slices. Small right pleural effusion at the base which appears more simple and dependent. Airspace opacity in the right lung and to a much lesser extent in the left lung. No discrete cavity or airway mass. Upper Abdomen: Negative Musculoskeletal: The right apical mass is in close continuity with the upper ribs but no erosion is detected. No hematogenous osseous metastatic disease noted either. IMPRESSION: 1. 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 2. The recurrent right apical fluid collection is lined by masslike nodularity most consistent with recurrence. Recommend histologic correlation. 3. Right paratracheal adenopathy. 4. Small right pleural effusion. Electronically Signed   By: Monte Fantasia M.D.   On: 03/18/2021 04:49   CT Angio Chest Pulmonary Embolism (PE) W or WO Contrast  Result Date: 04/02/2021 CLINICAL DATA:  Shortness of breath and cough. History of pulmonary adenocarcinoma with right upper lobectomy. Undergoing chemotherapy EXAM: CT ANGIOGRAPHY CHEST WITH CONTRAST TECHNIQUE: Multidetector CT  imaging of the chest was performed using the standard protocol during bolus administration of intravenous contrast. Multiplanar CT image reconstructions and MIPs were obtained to evaluate the vascular anatomy. CONTRAST:  131mL OMNIPAQUE IOHEXOL 350 MG/ML SOLN COMPARISON:  Chest CT 03/27/2021 FINDINGS: Cardiovascular: Suboptimal bolus density/timing exacerbated by streak artifact and motion. No evidence of central, lobar, or (when occasionally visible) segmental branch PE. No cardiomegaly or pericardial effusion. No acute aortic finding. Diffuse atheromatous calcification of the coronaries. Mediastinum/Nodes: No noted adenopathy or inflammation. Lungs/Pleura: Right apical collection with peripheral nodularity and bulging of adjacent mediastinal structures, 10.6 Cm in maximal transverse span. This collection contains new gas and there is progressive adjacent pulmonary opacification. There is also new infiltrate in the left lower lung with the same airspace pattern. The major central airways are clear. The dependent right pleural effusion is unchanged in size, small to moderate. Upper Abdomen: Negative Musculoskeletal: No acute finding no evidence of direct erosion or hematogenous bony metastasis. Review of the MIP images confirms the above findings. IMPRESSION: 1. New gas in the complex right apical collection, now likely communicating with the airways. Attendant increase in bilateral airspace disease which could be alveolar hemorrhage, endobronchial debris spread, or pneumonia. 2. Suboptimal pulmonary artery angiogram with no evidence of main or lobar pulmonary embolism. 3. Notable nodularity at the periphery of the right apical  collection concerning for tumor recurrence. Electronically Signed   By: Monte Fantasia M.D.   On: 04/02/2021 11:09   CT LUMBAR SPINE WO CONTRAST  Result Date: 04/09/2021 CLINICAL DATA:  Initial evaluation for low back pain. History of lung cancer. EXAM: CT LUMBAR SPINE WITHOUT CONTRAST  TECHNIQUE: Multidetector CT imaging of the lumbar spine was performed without intravenous contrast administration. Multiplanar CT image reconstructions were also generated. COMPARISON:  None available. FINDINGS: Segmentation: Standard. Lowest well-formed disc space labeled the L5-S1 level. Alignment: Trace retrolisthesis of T12 on L1 and L2 on L3. Underlying minimal sigmoid scoliotic curvature. Vertebrae: Vertebral body height maintained without acute or chronic fracture. Visualized sacrum and pelvis intact. SI joints symmetric and normal. No discrete lytic or blastic osseous lesions to suggest osseous metastatic disease evident by CT. Subcentimeter sclerotic lesion at the posterior right iliac wing noted, most characteristic of a small benign bone island (series 2, image 134). Paraspinal and other soft tissues: Paraspinous soft tissues demonstrate no acute finding. 1.8 cm well-circumscribed hypodense lesion within the subcutaneous fat along the midline of the mid back at the level of T12-L1 most characteristic of a small sebaceous cyst/epidermal inclusion cyst (series 3, image 65). No other discrete soft tissue lesions. Small to moderate layering right pleural effusion partially visualized. Postoperative changes noted about the partially visualized colon. Mild aorto bi-iliac atherosclerotic disease. Visualized visceral structures otherwise unremarkable. Disc levels: T12-L1: Trace retrolisthesis. Degenerative intervertebral disc space narrowing with disc desiccation and mild disc bulge. Superimposed right extraforaminal disc protrusion (series 3, image 60). No spinal stenosis. Mild right foraminal narrowing. Left neural foramina remains patent. L1-2: Degenerative intervertebral disc space narrowing with diffuse disc bulge, eccentric to the right. Mild facet hypertrophy. No significant spinal stenosis. Moderate right worse than left L1 foraminal narrowing. L2-3: Degenerative intervertebral disc space narrowing with  disc desiccation and diffuse disc bulge. Mild reactive endplate spurring. Mild facet hypertrophy. No spinal stenosis. Mild left L2 foraminal narrowing. Right neural foramen remains patent. L3-4: Degenerative intervertebral disc space narrowing with diffuse disc bulge and mild disc desiccation. Minimal reactive endplate spurring. Mild facet hypertrophy. Resultant mild spinal stenosis. Mild bilateral L3 foraminal narrowing. L4-5: Degenerative intervertebral disc space narrowing with diffuse disc bulge and disc desiccation. Associated reactive endplate change with marginal endplate osteophytic spurring. Suspected superimposed small right foraminal disc protrusion (series 3, image 120). Mild to moderate facet hypertrophy. Mild canal with bilateral lateral recess stenosis. Moderate right worse than left L4 foraminal narrowing. L5-S1: Degenerative intervertebral disc space narrowing with diffuse disc bulge and disc desiccation. Associated reactive endplate spurring. Mild to moderate right worse than left facet hypertrophy. No significant spinal stenosis. Moderate bilateral L5 foraminal narrowing, right worse than left. IMPRESSION: 1. No acute abnormality within the lumbar spine. No CT evidence for osseous metastatic disease identified. If there is persistent clinical concern for possible occult metastatic disease, further assessment with dedicated MRI or bone scan would be recommended for further evaluation. 2. Moderate multilevel degenerative spondylolysis. Resultant mild spinal stenosis at L3-4 and L4-5, with mild-to-moderate multilevel foraminal narrowing as above, most pronounced at L4 and L5 bilaterally. 3. Small to moderate layering right pleural effusion, partially visualized. Electronically Signed   By: Jeannine Boga M.D.   On: 04/09/2021 02:42   CT PELVIS WO CONTRAST  Result Date: 04/09/2021 CLINICAL DATA:  Bilateral hip pain. Back pain. New onset pain limiting ability to walk. History of lung cancer.  EXAM: CT PELVIS WITHOUT CONTRAST TECHNIQUE: Multidetector CT imaging of the pelvis was performed following the  standard protocol without intravenous contrast. COMPARISON:  PET CT 12/13/2020 FINDINGS: Urinary Tract: Distal ureters are decompressed. Unremarkable urinary bladder. Bowel: Moderate stool in the included colon. No bowel inflammation. Vascular/Lymphatic: Iliac atherosclerosis.  No pelvic adenopathy. Reproductive:  Unremarkable prostate gland. Other:  No pelvic free fluid. Musculoskeletal: No blastic or destructive lytic lesions. No evidence of fracture. Both femoral heads are seated in the acetabulum with preservation of joint spaces. No visualized avascular necrosis. No periosteal reaction or bony destruction. No hip joint effusions. Minimal degenerative change of both sacroiliac joints. Lumbar spine assessed on concurrent lumbar spine CT. There are no intramuscular findings to suggest metastatic implants are disease. IMPRESSION: No findings of osseous metastatic disease on CT. No acute explanation for hip pain. If there is persistent clinical concern for bony metastasis, recommend further assessment with MRI or bone scan. Electronically Signed   By: Keith Rake M.D.   On: 04/09/2021 01:34   DG CHEST PORT 1 VIEW  Result Date: 04/09/2021 CLINICAL DATA:  Chest tube removal EXAM: PORTABLE CHEST 1 VIEW COMPARISON:  04/09/2021 at 0507 hours FINDINGS: Interval removal of right-sided pleural drainage catheter. Large rounded opacity persists at the right lung apex compatible with known complex fluid and air containing collection. Multiple surgical clips at the right lung apex. No pneumothorax is seen. Left lung is clear. Heart size is stable. IMPRESSION: Interval removal of right-sided pleural drainage catheter. No pneumothorax. Electronically Signed   By: Davina Poke D.O.   On: 04/09/2021 19:06   DG Chest Port 1 View  Result Date: 04/09/2021 CLINICAL DATA:  Chest tube, possible pleural effusion  EXAM: PORTABLE CHEST 1 VIEW COMPARISON:  Portable exam 0507 hours compared to 04/07/2021 FINDINGS: Pigtail RIGHT thoracostomy tube at upper RIGHT hemithorax unchanged. Persistent pleuroparenchymal opacity at RIGHT apex as well as numerous surgical clips, unchanged. Subsegmental atelectasis mid RIGHT lung. No pneumothorax or basilar pleural effusion. LEFT lung clear. Heart size stable. IMPRESSION: No interval change in RIGHT apical opacity and pigtail thoracostomy tube. Electronically Signed   By: Lavonia Dana M.D.   On: 04/09/2021 08:15   DG Chest Port 1 View  Result Date: 04/07/2021 CLINICAL DATA:  Right-sided pleural effusion EXAM: PORTABLE CHEST 1 VIEW COMPARISON:  Yesterday FINDINGS: Midline trachea. Right-sided pigtail catheter again projects over the right lung apex with similar opacification. Mild right hemidiaphragm elevation. No well-defined pneumothorax. Clear left lung. Normal heart size. IMPRESSION: Similar appearance of the chest with pigtail catheter projecting over an area of opacified right lung apex. No new findings. Electronically Signed   By: Abigail Miyamoto M.D.   On: 04/07/2021 11:46   DG CHEST PORT 1 VIEW  Result Date: 04/06/2021 CLINICAL DATA:  Cough EXAM: PORTABLE CHEST 1 VIEW COMPARISON:  04/05/2021 FINDINGS: Slight interval increase in fluid opacification of the right pulmonary apex, with a pigtail drainage catheter remaining in position. Left lung is normally aerated. Heart and mediastinum are unremarkable. IMPRESSION: Slight interval increase in fluid opacification of the right pulmonary apex, with a pigtail drainage catheter remaining in position. Left lung is normally aerated. Electronically Signed   By: Eddie Candle M.D.   On: 04/06/2021 12:54   DG Chest Port 1 View  Result Date: 04/05/2021 CLINICAL DATA:  Right chest tube. EXAM: PORTABLE CHEST 1 VIEW COMPARISON:  Chest x-ray 04/04/2021.  CT 04/02/2021. FINDINGS: Surgical clips right upper chest. Right chest tube in stable  position. No pneumothorax. Right apical cavitary fluid collection/mass again noted without interim change. No pleural effusion. Stable cardiomegaly. Mild thoracic spine  scoliosis. IMPRESSION: 1.  Right chest tube in stable position.  No pneumothorax. 2. Large right apical cavitary fluid collection/mass again noted without interim change. Electronically Signed   By: Marcello Moores  Register   On: 04/05/2021 06:14   DG Chest Port 1 View  Result Date: 04/04/2021 CLINICAL DATA:  Chest tube.  Pleural effusion. EXAM: PORTABLE CHEST 1 VIEW COMPARISON:  04/03/2021. FINDINGS: Right chest tube noted over the right upper chest. No pneumothorax. Large right apical fluid collection/mass again noted without interim change. Low lung volumes with mild bibasilar atelectasis. Stable right-sided pleural thickening. No pleural effusion. Heart size stable. IMPRESSION: 1. Right chest tube noted over the right upper chest. No pneumothorax. 2. Large right apical fluid collection/mass again noted without interim change. Low lung volumes with mild bibasilar atelectasis. Electronically Signed   By: Marcello Moores  Register   On: 04/04/2021 08:26   DG Chest Port 1 View  Result Date: 04/03/2021 CLINICAL DATA:  Hemoptysis. EXAM: PORTABLE CHEST 1 VIEW COMPARISON:  CT 04/02/2021.  Chest x-ray 04/02/2021. FINDINGS: Heart size stable. Large right apical fluid collection/mass again noted without interim change. Low lung volumes with persistent bibasilar atelectasis. Mild bibasilar infiltrates again noted. Stable right-sided pleural thickening. No pneumothorax. Surgical clips right chest. Mild thoracic spine scoliosis. IMPRESSION: 1. Large right apical fluid collection/mass again noted without interim change. 2. Low lung volumes with persistent bibasilar atelectasis. Mild bibasilar infiltrates again noted. Electronically Signed   By: Marcello Moores  Register   On: 04/03/2021 07:26   DG Chest Port 1 View  Result Date: 04/02/2021 CLINICAL DATA:  Shortness of breath  and tachycardia. EXAM: PORTABLE CHEST 1 VIEW COMPARISON:  03/27/2021 FINDINGS: Stable cardiomediastinal silhouette. Right upper lobe opacity is again noted corresponding to loculated pleural fluid and recurrent tumor as noted on recent chest CT from 03/27/2021. No pleural effusion or edema. New airspace opacity identified within the left lower lobe. The visualized osseous structures are unremarkable. IMPRESSION: 1. New left lower lobe airspace opacity concerning for pneumonia. 2. Stable right upper lobe opacity corresponding to loculated pleural fluid and recurrent tumor. Electronically Signed   By: Kerby Moors M.D.   On: 04/02/2021 06:44   DG Chest Port 1 View  Result Date: 03/27/2021 CLINICAL DATA:  Right upper lobectomy EXAM: PORTABLE CHEST 1 VIEW COMPARISON:  03/24/2021 FINDINGS: Postsurgical changes in the right upper lobe. Persistent right upper lobe pulmonary mass unchanged from 03/24/2021. No pleural effusion or pneumothorax. Stable cardiomediastinal silhouette. No aggressive osseous lesion. IMPRESSION: Postsurgical changes in the right upper lobe. Persistent right upper lobe pulmonary mass unchanged from 03/24/2021. Electronically Signed   By: Kathreen Devoid   On: 03/27/2021 11:50   DG Chest Port 1 View  Result Date: 03/24/2021 CLINICAL DATA:  Respiration abnormal EXAM: PORTABLE CHEST 1 VIEW COMPARISON:  Radiograph 03/18/2021, CT 03/18/2021 FINDINGS: Large RIGHT upper lobe apical mass again demonstrated. Stable cardiac silhouette. Mild central venous congestion is slightly increased from prior. No pneumothorax. No pleural fluid. IMPRESSION: 1. Mild increase in venous pulmonary congestion. 2. Stable RIGHT apical mass. Electronically Signed   By: Suzy Bouchard M.D.   On: 03/24/2021 09:37   DG Chest Portable 1 View  Result Date: 03/18/2021 CLINICAL DATA:  Hemoptysis for several hours, history of known lung carcinoma EXAM: PORTABLE CHEST 1 VIEW COMPARISON:  03/15/2021 FINDINGS: Cardiac shadow is  within normal limits. Postsurgical changes are noted in the right apex with persistent soft tissue density in the apex stable from the prior exam. The previously seen right mid lung airspace opacity  has resolved in the interval. Left lung remains clear. No bony abnormality is noted. IMPRESSION: Clearing of previously seen right mid lung airspace opacity. Remainder of the exam is stable from the prior study. Electronically Signed   By: Inez Catalina M.D.   On: 03/18/2021 03:18   CT IMAGE GUIDED DRAINAGE BY PERCUTANEOUS CATHETER  Result Date: 04/03/2021 CLINICAL DATA:  Loculated effusion post partial pneumonectomy EXAM: CT GUIDED CHEST DRAIN PLACEMENT ANESTHESIA/SEDATION: Intravenous Fentanyl 74mcg and Versed 0.$RemoveBefo'5mg'[id number]$  were administered as conscious sedation during continuous monitoring of the patient's level of consciousness and physiological / cardiorespiratory status by the radiology RN, with a total moderate sedation time of 22 minutes. PROCEDURE: The procedure, risks, benefits, and alternatives were explained to the patient. Questions regarding the procedure were encouraged and answered. The patient understands and consents to the procedure. Select axial scans through the thorax were obtained. The loculated right apical collection was localized and an appropriate skin entry site was determined and marked. The operative field was prepped with chlorhexidinein a sterile fashion, and a sterile drape was applied covering the operative field. A sterile gown and sterile gloves were used for the procedure. Local anesthesia was provided with 1% Lidocaine. Under CT fluoroscopic guidance, 18 gauge percutaneous entry needle advanced to the collection. Amplatz wire advanced within the collection easily, position confirmed on CT. Tract dilated to facilitate placement of a 14 French pigtail drain catheter, position within the central dependent aspect of the collection. Bloody material returned. Catheter was secured externally 0  Prolene suture and StatLock and placed to Pleur-evac -20 cm H2O drainage. Site covered with sterile gauze and Vaseline gauze dressing. The patient tolerated the procedure well. COMPLICATIONS: None immediate FINDINGS: Loculated right apical collection was localized. 14 French pigtail drain catheter placed as chest drain. IMPRESSION: 1. Technically successful CT-guided right chest drain placement Electronically Signed   By: Lucrezia Europe M.D.   On: 04/03/2021 16:00     ASSESSMENT AND PLAN: This is a very pleasant 60 years old white male recently diagnosed with stage IIIa (T4, N0, M0) non-small cell lung cancer, adenocarcinoma presented with large right upper lobe lung mass with suspicious normal size mediastinal lymphadenopathy diagnosed in April 2022. He is status post right thoracotomy with extrapleural right upper lobectomy and lymph node dissection as well as intercostal nerve blocks under the care of Dr. Roxan Hockey on 01/22/2021.  The final pathology was consistent with poorly differentiated adenocarcinoma spanning 9.0 cm with tumor invading the pleura and bronchus but the vascular and bronchial margins were negative.  The dissected lymph nodes were negative for malignancy. The patient had a complicated postoperative course with significant pain as well as recurrent pneumonia in the right lung and recurrent pleural effusion.  He recently underwent ultrasound-guided right thoracentesis but the final cytology from the fluid is still pending. The patient started adjuvant radiotherapy to the positive bronchial and pleural margin under the care of Dr. Lisbeth Renshaw and he is expected to complete this course on May 07, 2021. 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  He also has PD-L1 expression of 90%. I explained to the patient that he would benefit from a course of adjuvant treatment with systemic chemotherapy followed by immunotherapy after completion of the adjuvant  radiotherapy. F the pleural fluid is positive for malignancy, the patient will be treated for stage IV non-small cell lung cancer with either immunotherapy as monotherapy or a combination of chemotherapy and immunotherapy. 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 The patient and his wife agreed to the current plan. I will see him back for follow-up visit by the end of his radiation for more detailed discussion of his systemic treatment options. The patient was advised to call immediately if he has any other concerning symptoms in the interval.  The patient voices understanding of current disease status and treatment options and is in agreement with the current care plan.  All questions were answered. The patient knows to call the clinic with any problems, questions or concerns. We can certainly see the patient much sooner if necessary. The total time spent in the appointment was 55 minutes.  Disclaimer: This note was dictated with voice recognition software. Similar sounding words can inadvertently be transcribed and may not be corrected upon review.

## 2021-04-10 NOTE — Progress Notes (Signed)
Pt discharge to home with wife, instructions reviewed with both, reviewed med and diabetic teaching completed. Pt wife acknowledge understanding and taking lead on care. Taught wife insulin administration. Reviewed all MD appointments and prescriptions with family. SRP, RN

## 2021-04-10 NOTE — Discharge Summary (Signed)
Physician Discharge Summary  Christopher Carter VFI:[id number] DOB: Apr 05, 1961 DOA: 03/18/2021  PCP: Christopher Cruel, MD  Admit date: 03/18/2021 Discharge date: 04/10/2021  Admitted From: Home Disposition:  Home  Recommendations for Outpatient Follow-up:  Follow up with PCP in 1-2 weeks Follow up with CT surgery as scheduled Follow up with Pulmonary as scheduled  Discharge Condition:Improved CODE STATUS:Full Diet recommendation: Diabetic   Brief/Interim Summary: 60 year old M with PMH of stage IV adenocarcinoma s/p RUL lobectomy with node dissection on 5/16, recurrent hemoptysis, chronic RF on 5 L, tobacco abuse, HTN, depression, loculated hemithorax treated with IR chest tube placement, empiric antibiotics and prednisone taper and discharged to follow-up with CTS and radiation oncology.  He returned with cough with hemoptysis, right upper back pain, and increased oxygen requirement.  CT chest showed airspace disease in right lung representing alveolar hemorrhage with recurrence of apical fluid collection, nodular masslike lesion concerning for recurrence of malignancy and right peritracheal adenopathy.  He was admitted to [facility].  CTS, pulmonary IR and palliative consulted.  Started on IV cefepime, systemic steroid and tranexamic acid nebulization.  He underwent bronchoscopy that did not reveal active bleeding. Started on PCA Dilaudid by PMT for pain control, and transferred to [facility] for radiation oncology care.    Patient's hemoptysis resolved, oxygen requirement improved, and made good progression until 7/24 when he had small-volume hemoptysis on tissue papers.  Then, he spiked fever, tachycardia, tachypnea with increased oxygen requirement the early morning of 7/25.  CTA chest negative for large central PE but with new gas in complex right apical collection concerning for communication with the airways, adjacent increase in bilateral airspace disease.  Cultures obtained.  Started on  broad-spectrum antibiotics with Vanco and Zosyn.  Transferred to stepdown unit.  CTS consulted and recommended drain placement by IR. Drain was placed and pt was continued on antibiotics. Patient improved and drain output decreased. Chest tube was removed by CT surgery 04/09/21.   Discharge Diagnoses:  Principal Problem:   Intra-alveolar hemorrhage Active Problems:   Essential hypertension   Acute on chronic respiratory failure with hypoxia (HCC)   Malignant neoplasm of right upper lobe of lung (HCC)   Major depressive disorder   Uncontrolled pain   Leukocytosis   Normocytic anemia   Tooth abscess   Thrombocytosis   Palliative care by specialist   Palliative care encounter   Abscess of upper lobe of right lung with pneumonia ([place])  Intra-alveolar hemorrhage in patient with history of recurrent hemoptysis-likely secondary to lung cancer. Stage IV Rt Lung Ca s/p RUL lobectomy and node dissection by Dr. Koleen Nimrod on 5/16, spinal mets ruled out Acute on chronic respiratory failure with hypoxia-on NRB.  CXR with new LLL PNA.  High risk for VTE. Severe sepsis: Febrile, tachycardic, tachypnea with acute on chronic hypoxic respiratory failure -7/10-CTA chest concerning for alveolar hemorrhage, loculated fluid at right lung apex lined by masslike nodularity consistent with recurrence of malignancy and right peritracheal LAD -7/11-bronchoscopy on 7/11-no active hemorrhage -7/12-respiratory culture with normal respiratory flora -7/13-Rad/onc-started radiation.  Planned for 6 and half week -7/18-completed 7 days of IV cefepime. -7/19-repeat CTA chest without significant change, may be improved hemorrhage -7/21-started on azithromycin.   -7/23-started on Augmentin for dental abscess -7/24-prednisone resumed at 20 mg daily due to recurrence of hemoptysis -7/25-fever, increased O2 requirement, tachycardia and tachypnea.  CXR with new LLL infiltrate.  CTA chest negative for large central PE but with  new gas in complex right apical collection concerning for communication  with the airways, adjacent increase in bilateral airspace disease.  CTS consulted and recommended IR drain Blood and sputum cultures obtained and started on vancomycin and Zosyn.   -7/26-significant improvement.  Blood culture and MRSA PCR negative thus Vancomycin discontinued.  Drain was placed by IR -Per CTS, chest tube removed 8/1, to complete an additional 14 days of antibiotics on discharge    Uncontrolled cancer-related pain:  -Palliative medicine managing -was continued on fentanyl patch 100 mcg, p.o. morphine 7.5 mg PRN and IV fentanyl PRN -Celebrex and gabapentin. -On scheduled and as needed Valium for anxiety while in hospital -remains comfortable laying in bed, conversant    Dental pain/possible infection: Was on penicillin V outpatient.  Plan for root canal Tx on 7/11 but postponed. 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  Hyperglycemia partly due to steroid. -Continue SSI moderate as needed -Lantus 10 units twice daily -Have prescribed lantus with glucometer. Suspect pt may be able to stop insulin as he stops prednisone course    History of anxiety/depression: -On Seroquel, Zoloft and Valium   GERD: -Continue on Protonix   Discharge Instructions   Allergies as of 04/10/2021       Reactions   Oxycodone Hcl Other (See Comments)   Pt reported "seeing things" and " feeling unusual."   Bupropion Nausea And Vomiting        Medication List     STOP taking these  medications    amLODipine 10 MG tablet Commonly known as: NORVASC   HYDROmorphone 2 MG tablet Commonly known as: Dilaudid   penicillin v potassium 500 MG tablet Commonly known as: VEETID   potassium chloride SA 20 MEQ tablet Commonly known as: KLOR-CON       TAKE these medications    acetaminophen 500 MG tablet Commonly known as: TYLENOL Take 1,000 mg by mouth every 8 (eight) hours as needed for moderate pain or headache.   amoxicillin-clavulanate 875-125 MG tablet Commonly known as: AUGMENTIN Take 1 tablet by mouth every 12 (twelve) hours for 14 days.   benzonatate 200 MG capsule Commonly known as: TESSALON Take 1 capsule (200 mg total) by mouth 2 (two) times daily.   blood glucose meter kit and supplies Kit Dispense based on patient and insurance preference. Use up to four times daily as directed.   celecoxib 200 MG capsule Commonly known as: CELEBREX Take 1 capsule (200 mg total) by mouth 2 (two) times daily.   diazepam 10 MG tablet Commonly known as: VALIUM Take 0.5-1 tablets (5-10 mg total) by mouth daily as needed for anxiety (30 minutes PRIOR to Radiation).   diazepam 2 MG tablet Commonly known as: VALIUM Take 0.5 tablets (1 mg total) by mouth 3 (three) times daily.   fentaNYL 100 MCG/HR Commonly known as: Bath 1 patch onto the skin every 3 (three) days.   ferrous sulfate 325 (65 FE) MG tablet Take 1 tablet (325 mg total) by mouth daily with breakfast. Start taking on: April 11, 2021   gabapentin 300 MG capsule Commonly known as: NEURONTIN Take 300-600 mg by mouth See admin instructions. Takes 300 mg in the  morning and afternoon and 600 mg at night   insulin glargine 100 UNIT/ML Solostar Pen Commonly known as: LANTUS Inject 10 Units into the skin 2 (two) times daily.   Insulin Pen Needle 31G X 5 MM Misc 1 Device by Does not apply route in the morning and at bedtime. For use with insulin pen   ipratropium-albuterol 0.5-2.5 (3) MG/3ML  Soln Commonly known as: DUONEB Take 3 mLs by nebulization every 4 (four) hours as needed.   irbesartan 300 MG tablet Commonly known as: AVAPRO Take 300 mg by mouth daily.   latanoprost 0.005 % ophthalmic solution Commonly known as: XALATAN Place 1 drop into both eyes at bedtime.   lidocaine 5 % Commonly known as: LIDODERM Place 1 patch onto the skin daily. Remove & Discard patch within 12 hours or as directed by MD   morphine 15 MG tablet Commonly known as: MSIR Take 1 tablet (15 mg total) by mouth every 4 (four) hours as needed for severe pain.   pantoprazole 40 MG tablet Commonly known as: PROTONIX Take 1 tablet (40 mg total) by mouth daily.   polyethylene glycol 17 g packet Commonly known as: MIRALAX / GLYCOLAX Take 17 g by mouth daily as needed for mild constipation.   predniSONE 20 MG tablet Commonly known as: DELTASONE Take 1 tablet (20 mg total) by mouth daily with breakfast for 4 days. Start taking on: April 11, 2021 What changed:  how much to take how to take this when to take this additional instructions   QUEtiapine 50 MG tablet Commonly known as: SEROQUEL Take 100 mg by mouth at bedtime.   sertraline 100 MG tablet Commonly known as: ZOLOFT Take 100 mg by mouth daily.   vitamin B-12 1000 MCG tablet Commonly known as: CYANOCOBALAMIN Take 1,000 mcg by mouth daily.   vitamin C 1000 MG tablet Take 1,000 mg by mouth daily.               Durable Medical Equipment  (From admission, onward)           Start     Ordered   04/10/21 1006  For home use only DME Nebulizer machine  Once       Question Answer Comment  Patient needs a nebulizer to treat with the following condition SOB (shortness of breath)   Length of Need Lifetime      04/10/21 1006            Follow-up Information     [name], MD Follow up.   Specialty: Cardiothoracic Surgery Why: Office will contact with 1 week f/u, please get CXR 30 min prior to your  appointment at [place] [facility] located on first floow of our office building Contact information: [place] [address] [phone]         Christopher Cruel, MD Follow up in 2 day(s).   Specialty: Family Medicine Why: Hospital follow up Contact information: [place] [address] [phone]         Brand Males, MD. Schedule an appointment as soon as possible for a visit.   Specialty: Pulmonary Disease Why: Hospital follow up Contact information: [place] [address] [place] [address] [phone]                Allergies  Allergen Reactions   Oxycodone Hcl Other (See Comments)    Pt reported "seeing things" and " feeling unusual."   Bupropion Nausea  And Vomiting    Consultations: PCCM Cardiothoracic surgery IR Palliative medicine Oncology Radiation oncology Dental surgery  Procedures/Studies: DG Chest 2 View  Result Date: 04/10/2021 CLINICAL DATA:  Status post right chest tube removal. EXAM: CHEST - 2 VIEW COMPARISON:  04/09/2021.  CT 04/09/2021. FINDINGS: Surgical clips right upper chest. Stable density noted in the right apex consistent previously identified complex fluid collection. Stable adjacent atelectasis. Tiny right pleural effusion. No pneumothorax. IMPRESSION: Stable prominent density in the right apex consistent previously identified complex fluid collection. Stable adjacent atelectasis. Tiny right pleural effusion. No pneumothorax. Chest is unchanged from prior exam. Electronically Signed   By: Marcello Moores  Register   On: 04/10/2021 11:26   DG Chest 2 View  Result Date: 03/15/2021 CLINICAL DATA:  Provided history: Chest pain. Additional history provided: Hemoptysis and pain after lobectomy for lung cancer. Shortness of breath, posterior right upper chest pain. EXAM: CHEST - 2 VIEW COMPARISON:  Prior chest radiograph 03/13/2021 and earlier. FINDINGS: Heart size within normal limits.  Redemonstrated postsurgical changes within the right lung apex. Ill-defined airspace opacity within the right mid lung field, new as compared to the chest radiograph of 03/13/2021. The left lung remains clear. Persistent pleural thickening or trace pleural effusion at the right lung base. No evidence of pneumothorax. No acute bony abnormality identified. IMPRESSION: Nonspecific airspace disease within the right mid lung field, new from the chest radiographs of 03/13/2021. Primary consideration include pneumonia, atelectasis or hemorrhage. Redemonstrated postsurgical changes within the right lung apex. Persistent pleural thickening or trace pleural effusion at the right lung base. Electronically Signed   By: Kellie Simmering DO   On: 03/15/2021 13:44   DG Chest 2 View  Result Date: 03/13/2021 CLINICAL DATA:  Status post video assisted thorascopic surgery. EXAM: CHEST - 2 VIEW COMPARISON:  March 06, 2021. FINDINGS: The heart size and mediastinal contours are within normal limits. Left lung is clear. Stable postsurgical changes are noted in the right lung apex. Minimal right basilar subsegmental atelectasis or scarring is noted with small right pleural effusion. The visualized skeletal structures are unremarkable. IMPRESSION: Stable postsurgical changes seen in right lung apex. Minimal right basilar subsegmental atelectasis or scarring is noted with probable small right pleural effusion. Electronically Signed   By: Marijo Conception M.D.   On: 03/13/2021 14:43   CT CHEST WO CONTRAST  Result Date: 04/09/2021 CLINICAL DATA:  Evaluate right apical fluid collection following drain placement. Back pain. History of lung cancer with right upper lobectomy. EXAM: CT CHEST WITHOUT CONTRAST TECHNIQUE: Multidetector CT imaging of the chest was performed following the standard protocol without IV contrast. COMPARISON:  Chest CT 04/02/2021, 03/27/2021 and 03/18/2021 FINDINGS: Cardiovascular: Coronary artery atherosclerosis and aortic  valvular calcifications are noted. No acute vascular findings are seen on noncontrast imaging. The heart size is normal. There is no pericardial effusion. Mediastinum/Nodes: There are no enlarged mediastinal, hilar or axillary lymph nodes. The thyroid gland, trachea and esophagus demonstrate no significant findings. Lungs/Pleura: Stable small to moderate dependent right pleural effusion status post right upper lobectomy. Interval placement of a percutaneous pigtail catheter into the complex right apical collection. The superior component of this collection has mildly decreased in size, now measuring approximately 9.7 x 7.2 x 7.4 cm (previously 10.6 x 9.0 x 8.0 cm). This collection remains complex with high-density components and internal air. Complex inferolateral component containing air has not significantly changed, measuring up to 7.5 cm on image 65/2. There is no pneumothorax. The previously demonstrated patchy airspace opacities throughout  lungs have partially cleared. Upper abdomen: The visualized upper abdomen appears stable without significant findings. Probable small sebaceous cyst in the upper back. Musculoskeletal/Chest wall: There is no chest wall mass or suspicious osseous finding. Mild bilateral gynecomastia. IMPRESSION: 1. Interval mild decrease in size of complex fluid collection superiorly in the right hemithorax following percutaneous drainage catheter placement. There is persistent gas within this collection. 2. Interval improvement in previously demonstrated patchy bilateral airspace opacities consistent with resolving hemorrhage or infection. 3. Stable small right pleural effusion.  No pneumothorax. 4. Coronary artery atherosclerosis. Electronically Signed   By: Richardean Sale M.D.   On: 04/09/2021 14:00   CT CHEST W CONTRAST  Result Date: 03/27/2021 CLINICAL DATA:  Persistent chest pain. History of right upper lobectomy and recent CT suspicious for recurrence. EXAM: CT CHEST WITH CONTRAST  TECHNIQUE: Multidetector CT imaging of the chest was performed during intravenous contrast administration. CONTRAST:  78m OMNIPAQUE IOHEXOL 350 MG/ML SOLN COMPARISON:  Multiple recent radiographs.  Chest CT 9 days ago. FINDINGS: Cardiovascular: Normal caliber thoracic aorta. Mild atherosclerosis. Coronary artery calcifications. Normal heart size. No pericardial effusion. No obvious pulmonary embolus on this non angiographic exam. Mediastinum/Nodes: There are enlarged right lower paratracheal nodes measuring 16 and 17 mm, unchanged allowing for differences in caliper placement. Additional smaller mediastinal lymph nodes are similar. No thyroid nodule. No esophageal wall thickening. Lungs/Pleura: History of right upper lobectomy. Loculated collection at the right lung apex lined by thick masslike nodularity. This is not significantly changed in the interim. This again measures at least 10 cm ostial dimension. There scattered calcifications. There is surrounding ground-glass opacity extending into the adjacent right lung with improvement from prior exam. Small right pleural effusion is similar there is a posterior pleural based nodule in the right hemithorax measuring 17 mm, series 2, image 46. Upper Abdomen: No acute findings. Musculoskeletal: Postsurgical change of right posterior upper ribs. There is no frank bony destruction related to right apical masslike opacity. No discrete osseous lesions. No definite chest wall extension of apical lesion. IMPRESSION: 1. No significant change in loculated collection at the right lung apex lined by thick masslike nodularity, suspicious for malignancy recurrence. 2. Mild improvement in surrounding ground-glass opacity in the adjacent right lung, likely representing improving hemorrhage. 3. Unchanged small right pleural effusion. Unchanged pleural based nodule in the posterior right hemithorax. 4. Enlarged right lower paratracheal lymph nodes are similar to recent prior. 5.  Coronary artery calcifications. Aortic Atherosclerosis (ICD10-I70.0). Electronically Signed   By: MKeith RakeM.D.   On: 03/27/2021 19:58   CT Chest W Contrast  Result Date: 03/18/2021 CLINICAL DATA:  Hemoptysis.  Recent diagnosis of lung cancer EXAM: CT CHEST WITH CONTRAST TECHNIQUE: Multidetector CT imaging of the chest was performed during intravenous contrast administration. CONTRAST:  777mOMNIPAQUE IOHEXOL 300 MG/ML  SOLN COMPARISON:  02/26/2021 FINDINGS: Cardiovascular: Normal heart size. No pericardial effusion. Extensive coronary calcification. No acute vascular finding. Mediastinum/Nodes: Tubular centrally low-density structure along the upper right mediastinum is contiguous with a complex right apical mass and collection. There is right paratracheal nodule measuring 17 mm in diameter, suspicious for metastatic node. Lungs/Pleura: History of right upper lobectomy for cancer. There is a recently drained and re-accumulated loculated collection at the right apex lined by thick masslike nodularity, up to 10 cm on axial slices. Small right pleural effusion at the base which appears more simple and dependent. Airspace opacity in the right lung and to a much lesser extent in the left lung. No discrete cavity  or airway mass. Upper Abdomen: Negative Musculoskeletal: The right apical mass is in close continuity with the upper ribs but no erosion is detected. No hematogenous osseous metastatic disease noted either. IMPRESSION: 1. 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 2. The recurrent right apical fluid collection is lined by masslike nodularity most consistent with recurrence. Recommend histologic correlation. 3. Right paratracheal adenopathy. 4. Small right pleural effusion. Electronically Signed   By: Monte Fantasia M.D.   On: 03/18/2021 04:49   CT Angio Chest Pulmonary Embolism (PE) W or WO Contrast  Result Date: 04/02/2021 CLINICAL DATA:  Shortness  of breath and cough. History of pulmonary adenocarcinoma with right upper lobectomy. Undergoing chemotherapy EXAM: CT ANGIOGRAPHY CHEST WITH CONTRAST TECHNIQUE: Multidetector CT imaging of the chest was performed using the standard protocol during bolus administration of intravenous contrast. Multiplanar CT image reconstructions and MIPs were obtained to evaluate the vascular anatomy. CONTRAST:  134m OMNIPAQUE IOHEXOL 350 MG/ML SOLN COMPARISON:  Chest CT 03/27/2021 FINDINGS: Cardiovascular: Suboptimal bolus density/timing exacerbated by streak artifact and motion. No evidence of central, lobar, or (when occasionally visible) segmental branch PE. No cardiomegaly or pericardial effusion. No acute aortic finding. Diffuse atheromatous calcification of the coronaries. Mediastinum/Nodes: No noted adenopathy or inflammation. Lungs/Pleura: Right apical collection with peripheral nodularity and bulging of adjacent mediastinal structures, 10.6 Cm in maximal transverse span. This collection contains new gas and there is progressive adjacent pulmonary opacification. There is also new infiltrate in the left lower lung with the same airspace pattern. The major central airways are clear. The dependent right pleural effusion is unchanged in size, small to moderate. Upper Abdomen: Negative Musculoskeletal: No acute finding no evidence of direct erosion or hematogenous bony metastasis. Review of the MIP images confirms the above findings. IMPRESSION: 1. New gas in the complex right apical collection, now likely communicating with the airways. Attendant increase in bilateral airspace disease which could be alveolar hemorrhage, endobronchial debris spread, or pneumonia. 2. Suboptimal pulmonary artery angiogram with no evidence of main or lobar pulmonary embolism. 3. Notable nodularity at the periphery of the right apical collection concerning for tumor recurrence. Electronically Signed   By: JMonte FantasiaM.D.   On: 04/02/2021 11:09    CT LUMBAR SPINE WO CONTRAST  Result Date: 04/09/2021 CLINICAL DATA:  Initial evaluation for low back pain. History of lung cancer. EXAM: CT LUMBAR SPINE WITHOUT CONTRAST TECHNIQUE: Multidetector CT imaging of the lumbar spine was performed without intravenous contrast administration. Multiplanar CT image reconstructions were also generated. COMPARISON:  None available. FINDINGS: Segmentation: Standard. Lowest well-formed disc space labeled the L5-S1 level. Alignment: Trace retrolisthesis of T12 on L1 and L2 on L3. Underlying minimal sigmoid scoliotic curvature. Vertebrae: Vertebral body height maintained without acute or chronic fracture. Visualized sacrum and pelvis intact. SI joints symmetric and normal. No discrete lytic or blastic osseous lesions to suggest osseous metastatic disease evident by CT. Subcentimeter sclerotic lesion at the posterior right iliac wing noted, most characteristic of a small benign bone island (series 2, image 134). Paraspinal and other soft tissues: Paraspinous soft tissues demonstrate no acute finding. 1.8 cm well-circumscribed hypodense lesion within the subcutaneous fat along the midline of the mid back at the level of T12-L1 most characteristic of a small sebaceous cyst/epidermal inclusion cyst (series 3, image 65). No other discrete soft tissue lesions. Small to moderate layering right pleural effusion partially visualized. Postoperative changes noted about the partially visualized colon. Mild aorto bi-iliac atherosclerotic disease. Visualized visceral structures otherwise unremarkable.  Disc levels: T12-L1: Trace retrolisthesis. Degenerative intervertebral disc space narrowing with disc desiccation and mild disc bulge. Superimposed right extraforaminal disc protrusion (series 3, image 60). No spinal stenosis. Mild right foraminal narrowing. Left neural foramina remains patent. L1-2: Degenerative intervertebral disc space narrowing with diffuse disc bulge, eccentric to the  right. Mild facet hypertrophy. No significant spinal stenosis. Moderate right worse than left L1 foraminal narrowing. L2-3: Degenerative intervertebral disc space narrowing with disc desiccation and diffuse disc bulge. Mild reactive endplate spurring. Mild facet hypertrophy. No spinal stenosis. Mild left L2 foraminal narrowing. Right neural foramen remains patent. L3-4: Degenerative intervertebral disc space narrowing with diffuse disc bulge and mild disc desiccation. Minimal reactive endplate spurring. Mild facet hypertrophy. Resultant mild spinal stenosis. Mild bilateral L3 foraminal narrowing. L4-5: Degenerative intervertebral disc space narrowing with diffuse disc bulge and disc desiccation. Associated reactive endplate change with marginal endplate osteophytic spurring. Suspected superimposed small right foraminal disc protrusion (series 3, image 120). Mild to moderate facet hypertrophy. Mild canal with bilateral lateral recess stenosis. Moderate right worse than left L4 foraminal narrowing. L5-S1: Degenerative intervertebral disc space narrowing with diffuse disc bulge and disc desiccation. Associated reactive endplate spurring. Mild to moderate right worse than left facet hypertrophy. No significant spinal stenosis. Moderate bilateral L5 foraminal narrowing, right worse than left. IMPRESSION: 1. No acute abnormality within the lumbar spine. No CT evidence for osseous metastatic disease identified. If there is persistent clinical concern for possible occult metastatic disease, further assessment with dedicated MRI or bone scan would be recommended for further evaluation. 2. Moderate multilevel degenerative spondylolysis. Resultant mild spinal stenosis at L3-4 and L4-5, with mild-to-moderate multilevel foraminal narrowing as above, most pronounced at L4 and L5 bilaterally. 3. Small to moderate layering right pleural effusion, partially visualized. Electronically Signed   By: Jeannine Boga M.D.   On:  04/09/2021 02:42   CT PELVIS WO CONTRAST  Result Date: 04/09/2021 CLINICAL DATA:  Bilateral hip pain. Back pain. New onset pain limiting ability to walk. History of lung cancer. EXAM: CT PELVIS WITHOUT CONTRAST TECHNIQUE: Multidetector CT imaging of the pelvis was performed following the standard protocol without intravenous contrast. COMPARISON:  PET CT 12/13/2020 FINDINGS: Urinary Tract: Distal ureters are decompressed. Unremarkable urinary bladder. Bowel: Moderate stool in the included colon. No bowel inflammation. Vascular/Lymphatic: Iliac atherosclerosis.  No pelvic adenopathy. Reproductive:  Unremarkable prostate gland. Other:  No pelvic free fluid. Musculoskeletal: No blastic or destructive lytic lesions. No evidence of fracture. Both femoral heads are seated in the acetabulum with preservation of joint spaces. No visualized avascular necrosis. No periosteal reaction or bony destruction. No hip joint effusions. Minimal degenerative change of both sacroiliac joints. Lumbar spine assessed on concurrent lumbar spine CT. There are no intramuscular findings to suggest metastatic implants are disease. IMPRESSION: No findings of osseous metastatic disease on CT. No acute explanation for hip pain. If there is persistent clinical concern for bony metastasis, recommend further assessment with MRI or bone scan. Electronically Signed   By: Keith Rake M.D.   On: 04/09/2021 01:34   DG CHEST PORT 1 VIEW  Result Date: 04/09/2021 CLINICAL DATA:  Chest tube removal EXAM: PORTABLE CHEST 1 VIEW COMPARISON:  04/09/2021 at 0507 hours FINDINGS: Interval removal of right-sided pleural drainage catheter. Large rounded opacity persists at the right lung apex compatible with known complex fluid and air containing collection. Multiple surgical clips at the right lung apex. No pneumothorax is seen. Left lung is clear. Heart size is stable. IMPRESSION: Interval removal of right-sided pleural  drainage catheter. No pneumothorax.  Electronically Signed   By: Davina Poke D.O.   On: 04/09/2021 19:06   DG Chest Port 1 View  Result Date: 04/09/2021 CLINICAL DATA:  Chest tube, possible pleural effusion EXAM: PORTABLE CHEST 1 VIEW COMPARISON:  Portable exam 0507 hours compared to 04/07/2021 FINDINGS: Pigtail RIGHT thoracostomy tube at upper RIGHT hemithorax unchanged. Persistent pleuroparenchymal opacity at RIGHT apex as well as numerous surgical clips, unchanged. Subsegmental atelectasis mid RIGHT lung. No pneumothorax or basilar pleural effusion. LEFT lung clear. Heart size stable. IMPRESSION: No interval change in RIGHT apical opacity and pigtail thoracostomy tube. Electronically Signed   By: Lavonia Dana M.D.   On: 04/09/2021 08:15   DG Chest Port 1 View  Result Date: 04/07/2021 CLINICAL DATA:  Right-sided pleural effusion EXAM: PORTABLE CHEST 1 VIEW COMPARISON:  Yesterday FINDINGS: Midline trachea. Right-sided pigtail catheter again projects over the right lung apex with similar opacification. Mild right hemidiaphragm elevation. No well-defined pneumothorax. Clear left lung. Normal heart size. IMPRESSION: Similar appearance of the chest with pigtail catheter projecting over an area of opacified right lung apex. No new findings. Electronically Signed   By: Abigail Miyamoto M.D.   On: 04/07/2021 11:46   DG CHEST PORT 1 VIEW  Result Date: 04/06/2021 CLINICAL DATA:  Cough EXAM: PORTABLE CHEST 1 VIEW COMPARISON:  04/05/2021 FINDINGS: Slight interval increase in fluid opacification of the right pulmonary apex, with a pigtail drainage catheter remaining in position. Left lung is normally aerated. Heart and mediastinum are unremarkable. IMPRESSION: Slight interval increase in fluid opacification of the right pulmonary apex, with a pigtail drainage catheter remaining in position. Left lung is normally aerated. Electronically Signed   By: Eddie Candle M.D.   On: 04/06/2021 12:54   DG Chest Port 1 View  Result Date: 04/05/2021 CLINICAL  DATA:  Right chest tube. EXAM: PORTABLE CHEST 1 VIEW COMPARISON:  Chest x-ray 04/04/2021.  CT 04/02/2021. FINDINGS: Surgical clips right upper chest. Right chest tube in stable position. No pneumothorax. Right apical cavitary fluid collection/mass again noted without interim change. No pleural effusion. Stable cardiomegaly. Mild thoracic spine scoliosis. IMPRESSION: 1.  Right chest tube in stable position.  No pneumothorax. 2. Large right apical cavitary fluid collection/mass again noted without interim change. Electronically Signed   By: Marcello Moores  Register   On: 04/05/2021 06:14   DG Chest Port 1 View  Result Date: 04/04/2021 CLINICAL DATA:  Chest tube.  Pleural effusion. EXAM: PORTABLE CHEST 1 VIEW COMPARISON:  04/03/2021. FINDINGS: Right chest tube noted over the right upper chest. No pneumothorax. Large right apical fluid collection/mass again noted without interim change. Low lung volumes with mild bibasilar atelectasis. Stable right-sided pleural thickening. No pleural effusion. Heart size stable. IMPRESSION: 1. Right chest tube noted over the right upper chest. No pneumothorax. 2. Large right apical fluid collection/mass again noted without interim change. Low lung volumes with mild bibasilar atelectasis. Electronically Signed   By: Marcello Moores  Register   On: 04/04/2021 08:26   DG Chest Port 1 View  Result Date: 04/03/2021 CLINICAL DATA:  Hemoptysis. EXAM: PORTABLE CHEST 1 VIEW COMPARISON:  CT 04/02/2021.  Chest x-ray 04/02/2021. FINDINGS: Heart size stable. Large right apical fluid collection/mass again noted without interim change. Low lung volumes with persistent bibasilar atelectasis. Mild bibasilar infiltrates again noted. Stable right-sided pleural thickening. No pneumothorax. Surgical clips right chest. Mild thoracic spine scoliosis. IMPRESSION: 1. Large right apical fluid collection/mass again noted without interim change. 2. Low lung volumes with persistent bibasilar atelectasis. Mild bibasilar  infiltrates again noted. Electronically Signed   By: Marcello Moores  Register   On: 04/03/2021 07:26   DG Chest Port 1 View  Result Date: 04/02/2021 CLINICAL DATA:  Shortness of breath and tachycardia. EXAM: PORTABLE CHEST 1 VIEW COMPARISON:  03/27/2021 FINDINGS: Stable cardiomediastinal silhouette. Right upper lobe opacity is again noted corresponding to loculated pleural fluid and recurrent tumor as noted on recent chest CT from 03/27/2021. No pleural effusion or edema. New airspace opacity identified within the left lower lobe. The visualized osseous structures are unremarkable. IMPRESSION: 1. New left lower lobe airspace opacity concerning for pneumonia. 2. Stable right upper lobe opacity corresponding to loculated pleural fluid and recurrent tumor. Electronically Signed   By: Kerby Moors M.D.   On: 04/02/2021 06:44   DG Chest Port 1 View  Result Date: 03/27/2021 CLINICAL DATA:  Right upper lobectomy EXAM: PORTABLE CHEST 1 VIEW COMPARISON:  03/24/2021 FINDINGS: Postsurgical changes in the right upper lobe. Persistent right upper lobe pulmonary mass unchanged from 03/24/2021. No pleural effusion or pneumothorax. Stable cardiomediastinal silhouette. No aggressive osseous lesion. IMPRESSION: Postsurgical changes in the right upper lobe. Persistent right upper lobe pulmonary mass unchanged from 03/24/2021. Electronically Signed   By: Kathreen Devoid   On: 03/27/2021 11:50   DG Chest Port 1 View  Result Date: 03/24/2021 CLINICAL DATA:  Respiration abnormal EXAM: PORTABLE CHEST 1 VIEW COMPARISON:  Radiograph 03/18/2021, CT 03/18/2021 FINDINGS: Large RIGHT upper lobe apical mass again demonstrated. Stable cardiac silhouette. Mild central venous congestion is slightly increased from prior. No pneumothorax. No pleural fluid. IMPRESSION: 1. Mild increase in venous pulmonary congestion. 2. Stable RIGHT apical mass. Electronically Signed   By: Suzy Bouchard M.D.   On: 03/24/2021 09:37   DG Chest Portable 1  View  Result Date: 03/18/2021 CLINICAL DATA:  Hemoptysis for several hours, history of known lung carcinoma EXAM: PORTABLE CHEST 1 VIEW COMPARISON:  03/15/2021 FINDINGS: Cardiac shadow is within normal limits. Postsurgical changes are noted in the right apex with persistent soft tissue density in the apex stable from the prior exam. The previously seen right mid lung airspace opacity has resolved in the interval. Left lung remains clear. No bony abnormality is noted. IMPRESSION: Clearing of previously seen right mid lung airspace opacity. Remainder of the exam is stable from the prior study. Electronically Signed   By: Inez Catalina M.D.   On: 03/18/2021 03:18   CT IMAGE GUIDED DRAINAGE BY PERCUTANEOUS CATHETER  Result Date: 04/03/2021 CLINICAL DATA:  Loculated effusion post partial pneumonectomy EXAM: CT GUIDED CHEST DRAIN PLACEMENT ANESTHESIA/SEDATION: Intravenous Fentanyl 60mg and Versed 0.579mwere administered as conscious sedation during continuous monitoring of the patient's level of consciousness and physiological / cardiorespiratory status by the radiology RN, with a total moderate sedation time of 22 minutes. PROCEDURE: The procedure, risks, benefits, and alternatives were explained to the patient. Questions regarding the procedure were encouraged and answered. The patient understands and consents to the procedure. Select axial scans through the thorax were obtained. The loculated right apical collection was localized and an appropriate skin entry site was determined and marked. The operative field was prepped with chlorhexidinein a sterile fashion, and a sterile drape was applied covering the operative field. A sterile gown and sterile gloves were used for the procedure. Local anesthesia was provided with 1% Lidocaine. Under CT fluoroscopic guidance, 18 gauge percutaneous entry needle advanced to the collection. Amplatz wire advanced within the collection easily, position confirmed on CT. Tract  dilated to facilitate placement of a 14 [place]  pigtail drain catheter, position within the central dependent aspect of the collection. Bloody material returned. Catheter was secured externally 0 Prolene suture and StatLock and placed to Pleur-evac -20 cm H2O drainage. Site covered with sterile gauze and Vaseline gauze dressing. The patient tolerated the procedure well. COMPLICATIONS: None immediate FINDINGS: Loculated right apical collection was localized. 14 French pigtail drain catheter placed as chest drain. IMPRESSION: 1. Technically successful CT-guided right chest drain placement Electronically Signed   By: Lucrezia Europe M.D.   On: 04/03/2021 16:00    Subjective: Very eager to go home  Discharge Exam: Vitals:   04/10/21 0640 04/10/21 0815  BP: 137/84   Pulse: 98   Resp: 20   Temp:    SpO2: 99% 92%   Vitals:   04/09/21 2012 04/10/21 0117 04/10/21 0640 04/10/21 0815  BP:   137/84   Pulse:   98   Resp:   20   Temp:      TempSrc:   Oral   SpO2: 100% 100% 99% 92%  Weight:      Height:        General: Pt is alert, awake, not in acute distress Cardiovascular: RRR, S1/S2 + Respiratory: CTA bilaterally, no wheezing, no rhonchi Abdominal: Soft, NT, ND, bowel sounds + Extremities: no edema, no cyanosis   The results of significant diagnostics from this hospitalization (including imaging, microbiology, ancillary and laboratory) are listed below for reference.     Microbiology: Recent Results (from the past 240 hour(s))  Culture, blood (routine x 2)     Status: None   Collection Time: 04/02/21  8:02 AM   Specimen: BLOOD  Result Value Ref Range Status   Specimen Description   Final    BLOOD BLOOD LEFT FOREARM Performed at [facility] [address], [place], [place] [address]    Special Requests   Final    BOTTLES DRAWN AEROBIC AND ANAEROBIC Blood Culture adequate volume Performed at [facility] [address], [place], [place]  [address]    Culture   Final    NO GROWTH 5 DAYS Performed at [facility], [place] [address] [place], [place] [address]    Report Status 04/07/2021 FINAL  Final  Culture, blood (routine x 2)     Status: None   Collection Time: 04/02/21  8:02 AM   Specimen: BLOOD  Result Value Ref Range Status   Specimen Description   Final    BLOOD RIGHT ANTECUBITAL Performed at [facility] [address] [place], [place] [address]    Special Requests   Final    BOTTLES DRAWN AEROBIC AND ANAEROBIC Blood Culture adequate volume Performed at [facility] [address] [place], [place] [address]    Culture   Final    NO GROWTH 5 DAYS Performed at [facility], [place] [address], [place], [place] [address]    Report Status 04/07/2021 FINAL  Final  MRSA Next Gen by PCR, Nasal     Status: None   Collection Time: 04/02/21  8:04 AM   Specimen: Nasal Mucosa; Nasal Swab  Result Value Ref Range Status   MRSA by PCR Next Gen NOT DETECTED NOT DETECTED Final    Comment: (NOTE) The GeneXpert MRSA Assay (FDA approved for NASAL specimens only), is one component of a comprehensive MRSA colonization surveillance program. It is not intended to diagnose MRSA infection nor to guide or monitor treatment for MRSA infections. Test performance is  not FDA approved in patients less than 26 years old. Performed at [facility], [place] [address] [place], [place] [address]   Expectorated Sputum Assessment w Gram Stain, Rflx to Resp Cult     Status: None   Collection Time: 04/02/21 10:27 AM   Specimen: Sputum  Result Value Ref Range Status   Specimen Description SPUTUM  Final   Special Requests NONE  Final   Sputum evaluation   Final    THIS SPECIMEN IS ACCEPTABLE FOR SPUTUM CULTURE Performed at [facility], [place] [address], [place], [place] [address]    Report Status 04/03/2021 FINAL  Final  Culture, Respiratory w Gram Stain      Status: None   Collection Time: 04/02/21 10:27 AM   Specimen: SPU  Result Value Ref Range Status   Specimen Description   Final    SPUTUM Performed at [facility] [address], [place], [place] [address]    Special Requests   Final    NONE Reflexed from [id number] Performed at [facility] [address] [place], [place] [address]    Gram Stain   Final    RARE WBC PRESENT,BOTH PMN AND MONONUCLEAR RARE GRAM POSITIVE COCCI IN CLUSTERS RARE GRAM NEGATIVE COCCOBACILLI    Culture   Final    MODERATE Normal respiratory flora-no Staph aureus or Pseudomonas seen Performed at [facility], [address] [place], [place] [address]    Report Status 04/05/2021 FINAL  Final  Body fluid culture w Gram Stain     Status: None   Collection Time: 04/04/21  4:20 PM   Specimen: Pleural Fluid  Result Value Ref Range Status   Specimen Description   Final    PLEURAL Performed at [facility] [address], [place], [place] [address]    Special Requests   Final    NONE Performed at [facility], [place] [address], [place], [place] [address]    Gram Stain   Final    RARE WBC PRESENT, PREDOMINANTLY MONONUCLEAR NO ORGANISMS SEEN    Culture   Final    NO GROWTH 3 DAYS Performed at [facility], [name] [address] [place], [place] [address]    Report Status 04/08/2021 FINAL  Final     Labs: BNP (last 3 results) No results for input(s): BNP in the last 8760 hours. Basic Metabolic Panel: Recent Labs  Lab 04/05/21 0241 04/06/21 0419 04/07/21 0431  NA 138 139 139  K 4.4 3.8 3.9  CL 101 102 99  CO2 31 31 32  GLUCOSE 137* 126* 110*  BUN _0 CREATININE 0.60* 0.58* 0.53*  CALCIUM 8.9 8.5* 9.1   Liver Function Tests: Recent Labs  Lab 04/05/21 0241 04/06/21 0419 04/07/21 0431  AST 11* 10* 9*  ALT _1 ALKPHOS 64 61 60  BILITOT 0.4 0.5 0.5  PROT 5.7* 5.3* 5.9*  ALBUMIN 2.6* 2.4* 2.6*    No results for input(s): LIPASE, AMYLASE in the last 168 hours. No results for input(s): AMMONIA in the last 168 hours. CBC: Recent Labs  Lab 04/04/21 0220 04/05/21 0241 04/06/21 0419 04/06/21 1944 04/07/21 0431 04/08/21 0444  WBC 8.8 10.6* 9.6  --  9.8 11.4*  NEUTROABS 7.3  --   --   --   --   --   HGB 7.4* 7.6* 6.9* 8.9* 8.1* 8.4*  HCT 24.5* 25.2* 22.0* 28.8* 26.3* 27.4*  MCV 96.8 98.1 96.5  --  97.0 97.5  PLT 233 261 246  --  267 290   Cardiac Enzymes: No results for input(s): CKTOTAL, CKMB, CKMBINDEX, TROPONINI in the last 168 hours. BNP: Invalid input(s): POCBNP CBG: Recent Labs  Lab 04/08/21 2231 04/09/21 0748 04/09/21 1134 04/09/21 1614 04/10/21 0839  GLUCAP 162* 178* 135* 210* 167*   D-Dimer No results for input(s): DDIMER in the last 72 hours. Hgb A1c No results for input(s): HGBA1C in the last 72 hours. Lipid Profile No results for input(s): CHOL, HDL, LDLCALC, TRIG, CHOLHDL, LDLDIRECT in the last 72 hours. Thyroid function studies No results for input(s): TSH, T4TOTAL, T3FREE, THYROIDAB in the last 72 hours.  Invalid input(s): FREET3 Anemia work up No results for input(s): VITAMINB12, FOLATE, FERRITIN, TIBC, IRON, RETICCTPCT in the last 72 hours. Urinalysis    Component Value Date/Time   COLORURINE YELLOW 01/18/2021 1500   APPEARANCEUR CLEAR 01/18/2021 1500   LABSPEC 1.006 01/18/2021 1500   PHURINE 6.0 01/18/2021 1500   GLUCOSEU NEGATIVE 01/18/2021 1500   HGBUR SMALL (A) 01/18/2021 1500   BILIRUBINUR NEGATIVE 01/18/2021 1500   KETONESUR NEGATIVE 01/18/2021 1500   PROTEINUR NEGATIVE 01/18/2021 1500   NITRITE NEGATIVE 01/18/2021 1500   LEUKOCYTESUR NEGATIVE 01/18/2021 1500   Sepsis Labs Invalid input(s): PROCALCITONIN,  WBC,  LACTICIDVEN Microbiology Recent Results (from the past 240 hour(s))  Culture, blood (routine x 2)     Status: None   Collection Time: 04/02/21  8:02 AM   Specimen: BLOOD  Result Value Ref Range Status   Specimen  Description   Final    BLOOD BLOOD LEFT FOREARM Performed at [facility], [place] [address], [place], [place] [address]    Special Requests   Final    BOTTLES DRAWN AEROBIC AND ANAEROBIC Blood Culture adequate volume Performed at [facility] [address], [place], [place] [address]    Culture   Final    NO GROWTH 5 DAYS Performed at [facility], [place] [address], [place], [place] [address]    Report Status 04/07/2021 FINAL  Final  Culture, blood (routine x 2)     Status: None   Collection Time: 04/02/21  8:02 AM   Specimen: BLOOD  Result Value Ref Range Status   Specimen Description   Final    BLOOD RIGHT ANTECUBITAL Performed at [facility] [address] [place], [place] [address]    Special Requests   Final    BOTTLES DRAWN AEROBIC AND ANAEROBIC Blood Culture adequate volume Performed at [facility] [address], [place], [place] [address]    Culture   Final    NO GROWTH 5 DAYS Performed at [facility], [place] [address], [place], [place] [address]    Report Status 04/07/2021 FINAL  Final  MRSA Next Gen by PCR, Nasal     Status: None   Collection Time: 04/02/21  8:04 AM   Specimen: Nasal Mucosa; Nasal Swab  Result Value Ref Range Status   MRSA by PCR Next Gen NOT DETECTED NOT DETECTED Final    Comment: (NOTE) The GeneXpert MRSA Assay (FDA approved for NASAL specimens only), is one component of a comprehensive MRSA colonization surveillance program. It is not intended to diagnose MRSA infection nor to guide or monitor treatment for MRSA infections. Test performance is not FDA approved in patients less than 37 years old. Performed at [facility], [place] [address] [place], [place]  [address]   Expectorated Sputum Assessment w Gram Stain, Rflx to Resp Cult     Status: None   Collection Time: 04/02/21 10:27 AM   Specimen: Sputum  Result Value Ref Range Status    Specimen Description SPUTUM  Final   Special Requests NONE  Final   Sputum evaluation   Final    THIS SPECIMEN IS ACCEPTABLE FOR SPUTUM CULTURE Performed at [facility], [place] [address], [place], [place] [address]    Report Status 04/03/2021 FINAL  Final  Culture, Respiratory w Gram Stain     Status: None   Collection Time: 04/02/21 10:27 AM   Specimen: SPU  Result Value Ref Range Status   Specimen Description   Final    SPUTUM Performed at [facility] [address] [place], [place] [address]    Special Requests   Final    NONE Reflexed from [phone] Performed at [facility] [address] [place], [place] [address]    Gram Stain   Final    RARE WBC PRESENT,BOTH PMN AND MONONUCLEAR RARE GRAM POSITIVE COCCI IN CLUSTERS RARE GRAM NEGATIVE COCCOBACILLI    Culture   Final    MODERATE Normal respiratory flora-no Staph aureus or Pseudomonas seen Performed at [facility], [address] [place], [place] [address]    Report Status 04/05/2021 FINAL  Final  Body fluid culture w Gram Stain     Status: None   Collection Time: 04/04/21  4:20 PM   Specimen: Pleural Fluid  Result Value Ref Range Status   Specimen Description   Final    PLEURAL Performed at [facility] [address], [place], [place] [address]    Special Requests   Final    NONE Performed at [facility], [place] [address] [place], [place] [address]    Gram Stain   Final    RARE WBC PRESENT, PREDOMINANTLY MONONUCLEAR NO ORGANISMS SEEN    Culture   Final    NO GROWTH 3 DAYS Performed at [facility], [place] [address], [place],  [address]    Report Status 04/08/2021 FINAL  Final   Time spent: 80mn  SIGNED:   SMarylu Lund MD  [facility] 04/10/2021, 12:45 PM  If 7PM-7AM, please contact night-coverage

## 2021-04-11 ENCOUNTER — Ambulatory Visit
Admission: RE | Admit: 2021-04-11 | Discharge: 2021-04-11 | Disposition: A | Discharge status: Home / Self Care | Payer: 59 | Source: Ambulatory Visit | Attending: Radiation Oncology | Admitting: Radiation Oncology

## 2021-04-11 ENCOUNTER — Inpatient Hospital Stay (HOSPITAL_BASED_OUTPATIENT_CLINIC_OR_DEPARTMENT_OTHER): Payer: 59 | Admitting: Internal Medicine

## 2021-04-11 ENCOUNTER — Other Ambulatory Visit: Payer: Self-pay

## 2021-04-11 ENCOUNTER — Telehealth: Payer: Self-pay

## 2021-04-11 DIAGNOSIS — C3411 Malignant neoplasm of upper lobe, right bronchus or lung: Secondary | ICD-10-CM | POA: Diagnosis present

## 2021-04-11 DIAGNOSIS — Z510 Encounter for antineoplastic radiation therapy: Secondary | ICD-10-CM | POA: Diagnosis not present

## 2021-04-11 DIAGNOSIS — G893 Neoplasm related pain (acute) (chronic): Secondary | ICD-10-CM

## 2021-04-11 MED ORDER — AMPHETAMINE-DEXTROAMPHETAMINE 10 MG PO TABS: 10.0000 mg | ORAL_TABLET | Freq: Two times a day (BID) | ORAL | 0 refills | Status: DC

## 2021-04-11 MED ORDER — DIAZEPAM 2 MG PO TABS: 2.0000 mg | ORAL_TABLET | Freq: Two times a day (BID) | ORAL | 0 refills | Status: DC

## 2021-04-11 MED ORDER — FENTANYL 25 MCG/HR TD PT72: MEDICATED_PATCH | TRANSDERMAL | 0 refills | Status: DC

## 2021-04-11 MED ORDER — MORPHINE SULFATE 30 MG PO TABS: 30.0000 mg | ORAL_TABLET | ORAL | 0 refills | Status: DC | PRN

## 2021-04-11 NOTE — TOC Progression Note (Signed)
Transition of Care [facility]) - Progression Note    Patient Details  Name: Christopher Carter MRN: [id number] Date of Birth: Jul 21, 1961  [facility] CM/SW Contact  Purcell Mouton, RN Phone Number: 04/11/2021, 9:32 AM  Clinical Narrative:     Opened chart related to pt needing nebulizer machine. A call was made to [employer] rep who will have nebulizer delivered today to pt. Pt was called and made aware that [employer] will deliver Neb machine today. Pt was okay with that. Asked pt did he need anything else and he said no.        Expected Discharge Plan and Services           Expected Discharge Date: 04/10/21                                     Social Determinants of Health (SDOH) Interventions    Readmission Risk Interventions No flowsheet data found.

## 2021-04-11 NOTE — Telephone Encounter (Signed)
(  4:22 pm) SW attempted call to patient/wife-Christopher Carter to schedule initial palliative care visit. SW received voicemail and left a message requesting a call back.

## 2021-04-12 ENCOUNTER — Other Ambulatory Visit (HOSPITAL_BASED_OUTPATIENT_CLINIC_OR_DEPARTMENT_OTHER): Payer: Self-pay

## 2021-04-12 ENCOUNTER — Other Ambulatory Visit: Payer: Self-pay | Admitting: Internal Medicine

## 2021-04-12 ENCOUNTER — Ambulatory Visit
Admission: RE | Admit: 2021-04-12 | Discharge: 2021-04-12 | Disposition: A | Discharge status: Home / Self Care | Payer: 59 | Source: Ambulatory Visit | Attending: Radiation Oncology | Admitting: Radiation Oncology

## 2021-04-12 ENCOUNTER — Other Ambulatory Visit (HOSPITAL_COMMUNITY): Payer: Self-pay

## 2021-04-12 DIAGNOSIS — C3411 Malignant neoplasm of upper lobe, right bronchus or lung: Secondary | ICD-10-CM | POA: Diagnosis not present

## 2021-04-12 MED ORDER — MORPHINE SULFATE 30 MG PO TABS
30.0000 mg | ORAL_TABLET | ORAL | 0 refills | Status: DC | PRN
  Filled 2021-04-12: qty 120, 20d supply, fill #0

## 2021-04-12 MED ORDER — CVS GLUCOSE METER TEST STRIPS VI STRP: ORAL_STRIP | 12 refills | Status: DC

## 2021-04-12 MED ORDER — FENTANYL 100 MCG/HR TD PT72: 1.0000 | MEDICATED_PATCH | TRANSDERMAL | 0 refills | Status: DC

## 2021-04-12 MED ORDER — FENTANYL 100 MCG/HR TD PT72
1.0000 | MEDICATED_PATCH | TRANSDERMAL | 0 refills | Status: AC
  Filled 2021-04-12: qty 10, 30d supply, fill #0

## 2021-04-12 NOTE — Progress Notes (Signed)
Pharmacy does not have record of Fentanyl 162mcg patch prescription sent on 7/31 and PA approved on 8/1. Resent script today.

## 2021-04-12 NOTE — Progress Notes (Signed)
Palliative Medicine RN Note: Rec'd email requesting Prior Auth submission for morphine and Adderall.  Adderall PA info: [id number] - PA Case ID: [id number] - Rx #: [id number]; no decision yet.  MSIR PA infor: [id number] - PA Case ID: [id number] - Rx #: [id number]; no decision yet  Zyeir Dymek G. Haden Cavenaugh, RN, BSN, [facility] Palliative Medicine Team 04/12/2021 3:00 PM Office [phone]     Addendum:  MSIR approval: Approvedtoday Request Reference Number: [id number]. MORPHINE SUL TAB 30MG  is approved through 04/12/2022. Your patient may now fill this prescription and it will be covered.  Adderall will be 24-72 hr for a decision.  Marjie Skiff Clarinda Obi, RN, BSN, [facility] Palliative Medicine Team 04/12/2021 3:21 PM Office [phone]

## 2021-04-12 NOTE — Progress Notes (Signed)
Fentanyl 129mcg patch not available at [facility]-script sent to [facility] where they have patch in stock.

## 2021-04-12 NOTE — Progress Notes (Signed)
[facility] Follow-Up  60 yo with   Christopher Carter is getting situated after being discharged yesterday-some delays in getting his medications due to prior authorizations and other delays. His wife is doing a great job managing all of the details and helping Christopher Carter with his care. Christopher Carter is much weaker and has a significant amount of deconditioning after a prolonged hospitalization. He is also having issues focusing and with is short term memory possibly related to his medication and sleep disturbance from hospitalization  He continues to have pain in bilateral hips-worse with walking. CT was negative for a metastatic lesion causing his pain-it is likely degenerative and related to musculoskeletal pain. He is stretching and using non-pharm modalities to help with this.  He is requiring more frequent MSIR dosing due to pain-dose is not holding him for 4 hour increments of time. Difficulty now that he is more mobile.  He is extremely fatigued and likely depressed and adjusting to how this illness has impacted his life and functional status-just his critical illness alone and unexpected complications makes him at risk for emotional and trauma related mental health issues.  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 Continue zoloft.  Lane Hacker, DO Palliative Medicine

## 2021-04-13 ENCOUNTER — Ambulatory Visit
Admission: RE | Admit: 2021-04-13 | Discharge: 2021-04-13 | Disposition: A | Discharge status: Home / Self Care | Payer: 59 | Source: Ambulatory Visit | Attending: Radiation Oncology | Admitting: Radiation Oncology

## 2021-04-13 ENCOUNTER — Other Ambulatory Visit (HOSPITAL_BASED_OUTPATIENT_CLINIC_OR_DEPARTMENT_OTHER): Payer: Self-pay

## 2021-04-13 ENCOUNTER — Other Ambulatory Visit: Payer: Self-pay

## 2021-04-13 DIAGNOSIS — C3411 Malignant neoplasm of upper lobe, right bronchus or lung: Secondary | ICD-10-CM | POA: Diagnosis not present

## 2021-04-13 MED FILL — Fentanyl Citrate Preservative Free (PF) Inj 100 MCG/2ML: INTRAMUSCULAR | Qty: 2 | Status: AC

## 2021-04-16 ENCOUNTER — Other Ambulatory Visit: Payer: Self-pay | Admitting: Internal Medicine

## 2021-04-16 ENCOUNTER — Other Ambulatory Visit (HOSPITAL_BASED_OUTPATIENT_CLINIC_OR_DEPARTMENT_OTHER): Payer: Self-pay

## 2021-04-16 ENCOUNTER — Other Ambulatory Visit: Payer: Self-pay | Admitting: Thoracic Surgery (Cardiothoracic Vascular Surgery)

## 2021-04-16 ENCOUNTER — Other Ambulatory Visit: Payer: Self-pay

## 2021-04-16 ENCOUNTER — Ambulatory Visit
Admission: RE | Admit: 2021-04-16 | Discharge: 2021-04-16 | Disposition: A | Discharge status: Home / Self Care | Payer: 59 | Source: Ambulatory Visit | Attending: Radiation Oncology | Admitting: Radiation Oncology

## 2021-04-16 DIAGNOSIS — C3411 Malignant neoplasm of upper lobe, right bronchus or lung: Secondary | ICD-10-CM

## 2021-04-16 DIAGNOSIS — J988 Other specified respiratory disorders: Secondary | ICD-10-CM | POA: Diagnosis not present

## 2021-04-16 DIAGNOSIS — G893 Neoplasm related pain (acute) (chronic): Secondary | ICD-10-CM | POA: Diagnosis not present

## 2021-04-16 MED ORDER — DEXAMETHASONE 1 MG PO TABS: 1.0000 mg | ORAL_TABLET | Freq: Every day | ORAL | 2 refills | Status: DC

## 2021-04-16 MED ORDER — PANTOPRAZOLE SODIUM 40 MG PO TBEC: 40.0000 mg | DELAYED_RELEASE_TABLET | Freq: Every day | ORAL | 6 refills | Status: AC

## 2021-04-16 NOTE — Progress Notes (Signed)
Refilled PPI (protonix) for GI protection. Pain significantly increased after stopping steroid and more fatigue. Will resume low dose steroid -decadron and very slowly taper him -goal will be to off steroid at the completion of radiation and prior to starting systemic treatment.  Lane Hacker, DO Palliative Medicine

## 2021-04-17 ENCOUNTER — Emergency Department (HOSPITAL_COMMUNITY): Payer: 59

## 2021-04-17 ENCOUNTER — Inpatient Hospital Stay (HOSPITAL_COMMUNITY)
Admission: EM | Admit: 2021-04-17 | Discharge: 2021-04-25 | DRG: 947 | Disposition: A | Discharge status: Hospice | Payer: 59 | Attending: Internal Medicine | Admitting: Internal Medicine

## 2021-04-17 ENCOUNTER — Encounter (HOSPITAL_COMMUNITY): Payer: Self-pay | Admitting: Internal Medicine

## 2021-04-17 ENCOUNTER — Ambulatory Visit: Payer: Self-pay | Admitting: Thoracic Surgery (Cardiothoracic Vascular Surgery)

## 2021-04-17 ENCOUNTER — Ambulatory Visit
Admit: 2021-04-17 | Discharge: 2021-04-17 | Disposition: A | Discharge status: Home / Self Care | Payer: 59 | Attending: Radiation Oncology | Admitting: Radiation Oncology

## 2021-04-17 ENCOUNTER — Other Ambulatory Visit: Payer: Self-pay

## 2021-04-17 DIAGNOSIS — F32A Depression, unspecified: Secondary | ICD-10-CM | POA: Diagnosis present

## 2021-04-17 DIAGNOSIS — F419 Anxiety disorder, unspecified: Secondary | ICD-10-CM | POA: Diagnosis present

## 2021-04-17 DIAGNOSIS — R000 Tachycardia, unspecified: Secondary | ICD-10-CM | POA: Diagnosis present

## 2021-04-17 DIAGNOSIS — R338 Other retention of urine: Secondary | ICD-10-CM | POA: Diagnosis not present

## 2021-04-17 DIAGNOSIS — C7951 Secondary malignant neoplasm of bone: Secondary | ICD-10-CM | POA: Diagnosis present

## 2021-04-17 DIAGNOSIS — G992 Myelopathy in diseases classified elsewhere: Secondary | ICD-10-CM | POA: Diagnosis present

## 2021-04-17 DIAGNOSIS — I82443 Acute embolism and thrombosis of tibial vein, bilateral: Secondary | ICD-10-CM | POA: Diagnosis present

## 2021-04-17 DIAGNOSIS — I10 Essential (primary) hypertension: Secondary | ICD-10-CM | POA: Diagnosis present

## 2021-04-17 DIAGNOSIS — M3313 Other dermatomyositis without myopathy: Secondary | ICD-10-CM | POA: Diagnosis present

## 2021-04-17 DIAGNOSIS — M545 Low back pain, unspecified: Secondary | ICD-10-CM | POA: Diagnosis not present

## 2021-04-17 DIAGNOSIS — R7989 Other specified abnormal findings of blood chemistry: Secondary | ICD-10-CM | POA: Diagnosis not present

## 2021-04-17 DIAGNOSIS — K047 Periapical abscess without sinus: Secondary | ICD-10-CM | POA: Diagnosis present

## 2021-04-17 DIAGNOSIS — A419 Sepsis, unspecified organism: Secondary | ICD-10-CM | POA: Diagnosis present

## 2021-04-17 DIAGNOSIS — J90 Pleural effusion, not elsewhere classified: Secondary | ICD-10-CM | POA: Diagnosis present

## 2021-04-17 DIAGNOSIS — R509 Fever, unspecified: Secondary | ICD-10-CM

## 2021-04-17 DIAGNOSIS — Z20822 Contact with and (suspected) exposure to covid-19: Secondary | ICD-10-CM | POA: Diagnosis present

## 2021-04-17 DIAGNOSIS — Z515 Encounter for palliative care: Secondary | ICD-10-CM | POA: Diagnosis not present

## 2021-04-17 DIAGNOSIS — D630 Anemia in neoplastic disease: Secondary | ICD-10-CM | POA: Diagnosis present

## 2021-04-17 DIAGNOSIS — G9529 Other cord compression: Secondary | ICD-10-CM | POA: Diagnosis not present

## 2021-04-17 DIAGNOSIS — R339 Retention of urine, unspecified: Secondary | ICD-10-CM | POA: Diagnosis not present

## 2021-04-17 DIAGNOSIS — J9601 Acute respiratory failure with hypoxia: Secondary | ICD-10-CM | POA: Diagnosis not present

## 2021-04-17 DIAGNOSIS — Z888 Allergy status to other drugs, medicaments and biological substances status: Secondary | ICD-10-CM

## 2021-04-17 DIAGNOSIS — K5903 Drug induced constipation: Secondary | ICD-10-CM | POA: Diagnosis present

## 2021-04-17 DIAGNOSIS — J189 Pneumonia, unspecified organism: Secondary | ICD-10-CM | POA: Diagnosis present

## 2021-04-17 DIAGNOSIS — Z885 Allergy status to narcotic agent status: Secondary | ICD-10-CM

## 2021-04-17 DIAGNOSIS — J9621 Acute and chronic respiratory failure with hypoxia: Secondary | ICD-10-CM | POA: Diagnosis present

## 2021-04-17 DIAGNOSIS — I2699 Other pulmonary embolism without acute cor pulmonale: Secondary | ICD-10-CM | POA: Diagnosis present

## 2021-04-17 DIAGNOSIS — T402X5A Adverse effect of other opioids, initial encounter: Secondary | ICD-10-CM | POA: Diagnosis present

## 2021-04-17 DIAGNOSIS — G834 Cauda equina syndrome: Secondary | ICD-10-CM | POA: Diagnosis present

## 2021-04-17 DIAGNOSIS — Z8616 Personal history of COVID-19: Secondary | ICD-10-CM | POA: Diagnosis not present

## 2021-04-17 DIAGNOSIS — R413 Other amnesia: Secondary | ICD-10-CM | POA: Diagnosis present

## 2021-04-17 DIAGNOSIS — E119 Type 2 diabetes mellitus without complications: Secondary | ICD-10-CM | POA: Diagnosis present

## 2021-04-17 DIAGNOSIS — Z66 Do not resuscitate: Secondary | ICD-10-CM | POA: Diagnosis present

## 2021-04-17 DIAGNOSIS — G893 Neoplasm related pain (acute) (chronic): Secondary | ICD-10-CM | POA: Diagnosis present

## 2021-04-17 DIAGNOSIS — R0602 Shortness of breath: Secondary | ICD-10-CM

## 2021-04-17 DIAGNOSIS — K219 Gastro-esophageal reflux disease without esophagitis: Secondary | ICD-10-CM | POA: Diagnosis present

## 2021-04-17 DIAGNOSIS — C3411 Malignant neoplasm of upper lobe, right bronchus or lung: Secondary | ICD-10-CM | POA: Diagnosis present

## 2021-04-17 DIAGNOSIS — Z79899 Other long term (current) drug therapy: Secondary | ICD-10-CM

## 2021-04-17 DIAGNOSIS — R262 Difficulty in walking, not elsewhere classified: Secondary | ICD-10-CM | POA: Diagnosis present

## 2021-04-17 DIAGNOSIS — J988 Other specified respiratory disorders: Secondary | ICD-10-CM

## 2021-04-17 DIAGNOSIS — Z902 Acquired absence of lung [part of]: Secondary | ICD-10-CM

## 2021-04-17 DIAGNOSIS — G902 Horner's syndrome: Secondary | ICD-10-CM | POA: Diagnosis present

## 2021-04-17 LAB — GLUCOSE, CAPILLARY
Glucose-Capillary: 148 mg/dL — ABNORMAL HIGH (ref 70–99)
Glucose-Capillary: 127 mg/dL — ABNORMAL HIGH (ref 70–99)
Glucose-Capillary: 141 mg/dL — ABNORMAL HIGH (ref 70–99)

## 2021-04-17 LAB — CBC WITH DIFFERENTIAL/PLATELET
Abs Immature Granulocytes: 0.11 10*3/uL — ABNORMAL HIGH (ref 0.00–0.07)
Basophils Absolute: 0 10*3/uL (ref 0.0–0.1)
Basophils Relative: 0 %
Eosinophils Absolute: 0.2 10*3/uL (ref 0.0–0.5)
Eosinophils Relative: 2 %
HCT: 28.4 % — ABNORMAL LOW (ref 39.0–52.0)
Hemoglobin: 8.9 g/dL — ABNORMAL LOW (ref 13.0–17.0)
Immature Granulocytes: 1 %
Lymphocytes Relative: 2 %
Lymphs Abs: 0.2 10*3/uL — ABNORMAL LOW (ref 0.7–4.0)
MCH: 30.2 pg (ref 26.0–34.0)
MCHC: 31.3 g/dL (ref 30.0–36.0)
MCV: 96.3 fL (ref 80.0–100.0)
Monocytes Absolute: 0.8 10*3/uL (ref 0.1–1.0)
Monocytes Relative: 10 %
Neutro Abs: 6.4 10*3/uL (ref 1.7–7.7)
Neutrophils Relative %: 85 %
Platelets: 223 10*3/uL (ref 150–400)
RBC: 2.95 MIL/uL — ABNORMAL LOW (ref 4.22–5.81)
RDW: 16.7 % — ABNORMAL HIGH (ref 11.5–15.5)
WBC: 7.7 10*3/uL (ref 4.0–10.5)
nRBC: 0 % (ref 0.0–0.2)

## 2021-04-17 LAB — COMPREHENSIVE METABOLIC PANEL
ALT: 12 U/L (ref 0–44)
AST: 10 U/L — ABNORMAL LOW (ref 15–41)
Albumin: 3.1 g/dL — ABNORMAL LOW (ref 3.5–5.0)
Alkaline Phosphatase: 71 U/L (ref 38–126)
Anion gap: 10 (ref 5–15)
BUN: 9 mg/dL (ref 6–20)
CO2: 30 mmol/L (ref 22–32)
Calcium: 9.1 mg/dL (ref 8.9–10.3)
Chloride: 98 mmol/L (ref 98–111)
Creatinine, Ser: 0.65 mg/dL (ref 0.61–1.24)
GFR, Estimated: >60 mL/min (ref >60)
Glucose, Bld: 158 mg/dL — ABNORMAL HIGH (ref 70–99)
Potassium: 4.3 mmol/L (ref 3.5–5.1)
Sodium: 138 mmol/L (ref 135–145)
Total Bilirubin: 0.7 mg/dL (ref 0.3–1.2)
Total Protein: 6.2 g/dL — ABNORMAL LOW (ref 6.5–8.1)

## 2021-04-17 LAB — RAPID URINE DRUG SCREEN, HOSP PERFORMED
Amphetamines: NOT DETECTED
Barbiturates: NOT DETECTED
Benzodiazepines: POSITIVE — AB
Cocaine: NOT DETECTED
Opiates: POSITIVE — AB
Tetrahydrocannabinol: NOT DETECTED

## 2021-04-17 LAB — URINALYSIS, ROUTINE W REFLEX MICROSCOPIC
Bilirubin Urine: NEGATIVE
Glucose, UA: NEGATIVE mg/dL
Hgb urine dipstick: NEGATIVE
Ketones, ur: NEGATIVE mg/dL
Leukocytes,Ua: NEGATIVE
Nitrite: NEGATIVE
Protein, ur: NEGATIVE mg/dL
Specific Gravity, Urine: 1.009 (ref 1.005–1.030)
pH: 5 (ref 5.0–8.0)

## 2021-04-17 LAB — PROTIME-INR
INR: 1 (ref 0.8–1.2)
Prothrombin Time: 13.6 seconds (ref 11.4–15.2)

## 2021-04-17 LAB — RESP PANEL BY RT-PCR (FLU A&B, COVID) ARPGX2
Influenza A by PCR: NEGATIVE
Influenza B by PCR: NEGATIVE
SARS Coronavirus 2 by RT PCR: NEGATIVE

## 2021-04-17 LAB — SARS CORONAVIRUS 2 (TAT 6-24 HRS): SARS Coronavirus 2: NEGATIVE

## 2021-04-17 LAB — LACTIC ACID, PLASMA: Lactic Acid, Venous: 0.7 mmol/L (ref 0.5–1.9)

## 2021-04-17 LAB — MRSA NEXT GEN BY PCR, NASAL: MRSA by PCR Next Gen: NOT DETECTED

## 2021-04-17 IMAGING — CT CT CHEST W/ CM
2 of 3 series · 13 of 36 positions shown, 16 images · IV contrast (OMNIPAQUE 350)
Comparison: [DATE] chest CT

CLINICAL DATA: Fever of unknown origin. History of lung cancer with
ongoing radiation therapy.

EXAM:
CT CHEST WITH CONTRAST
TECHNIQUE: Multidetector CT imaging of the chest was performed during
intravenous contrast administration.
CONTRAST:  75mL OMNIPAQUE IOHEXOL 350 MG/ML SOLN

[Series 2: axial st · axial · 0.67mm/px · z∈[+1382,+1624]mm · 10 of 143 slices shown, 13 images]
[im 11/143  mediastinal]
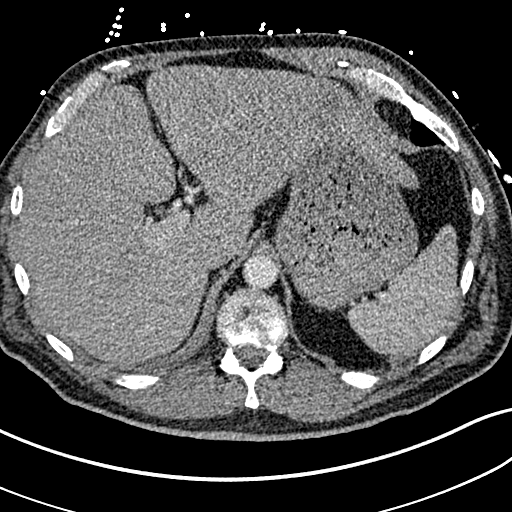
[im 11/143  lung]
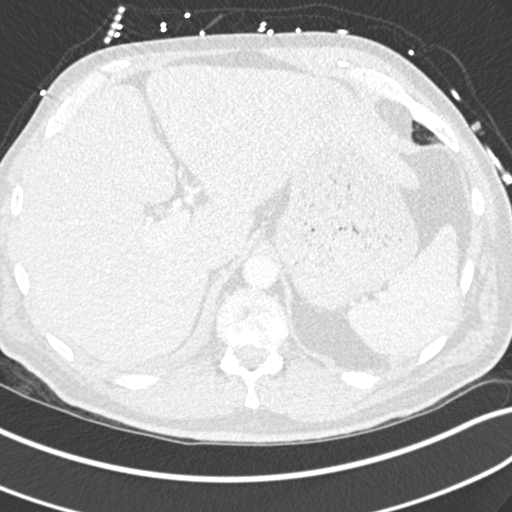
[im 22/143  lung]
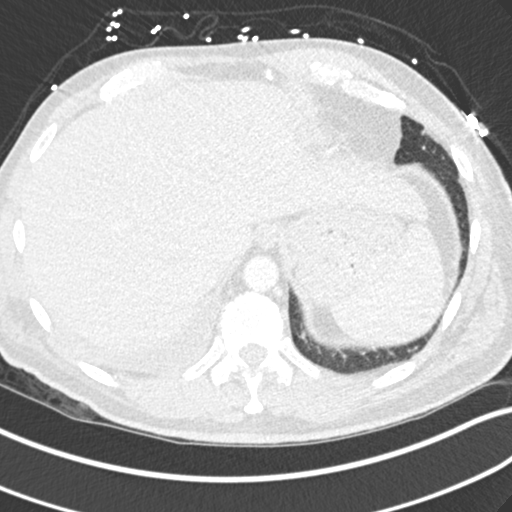
[im 37/143  lung]
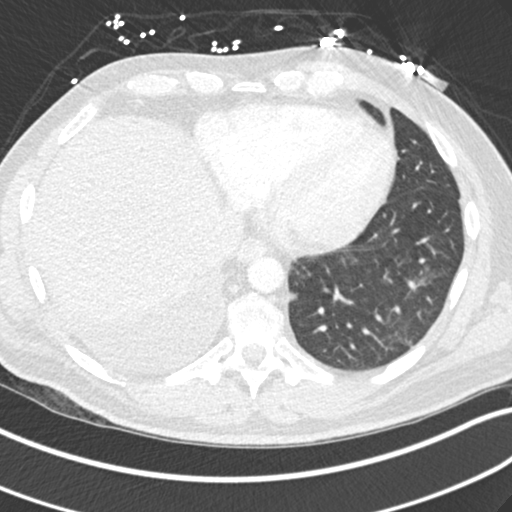
[im 53/143  lung]
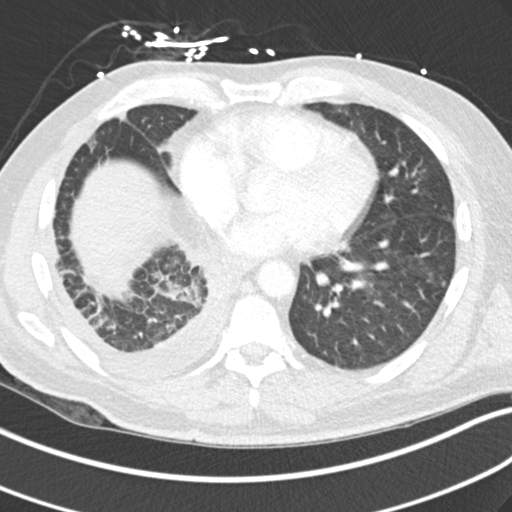
[im 64/143  mediastinal]
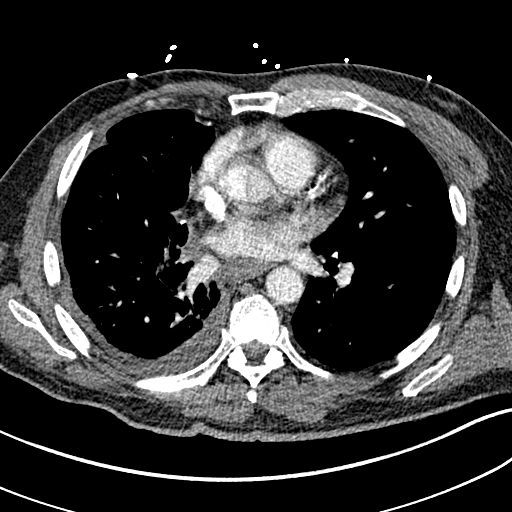
[im 64/143  lung]
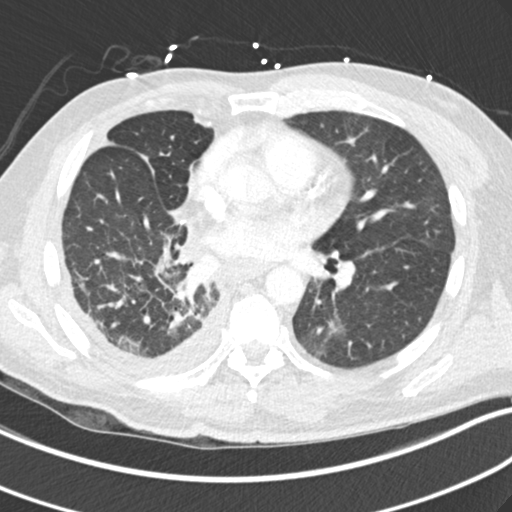
[im 79/143  lung]
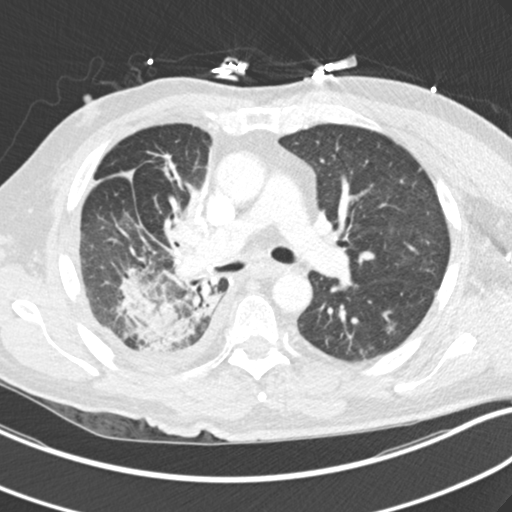
[im 90/143  lung]
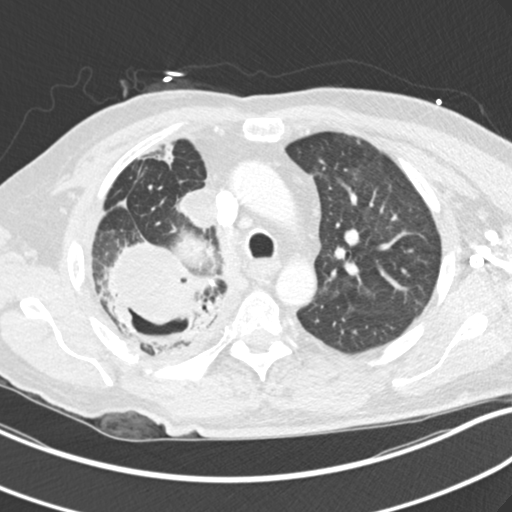
[im 106/143  lung]
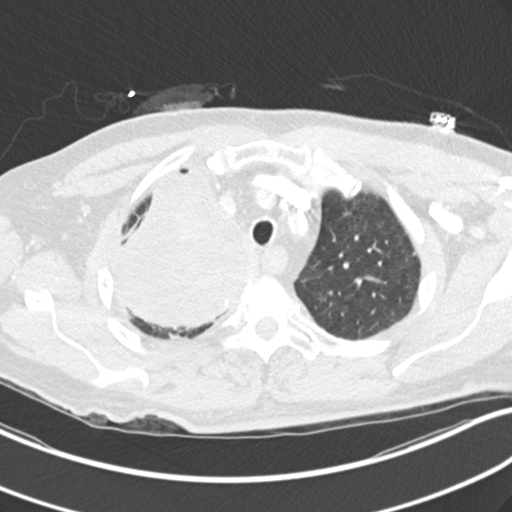
[im 121/143  mediastinal]
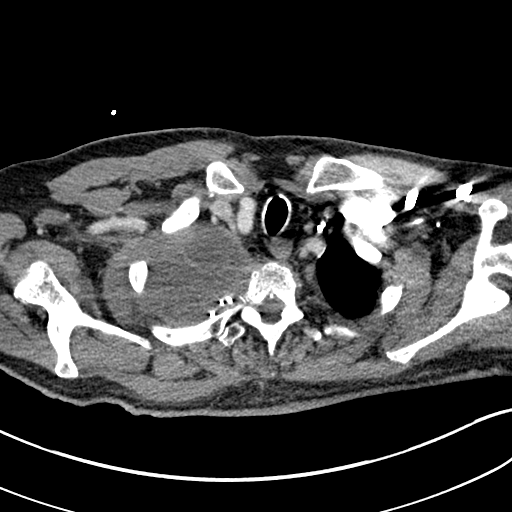
[im 121/143  lung]
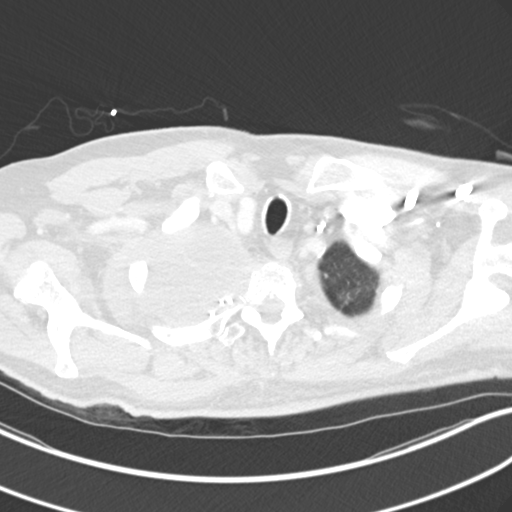
[im 132/143  lung]
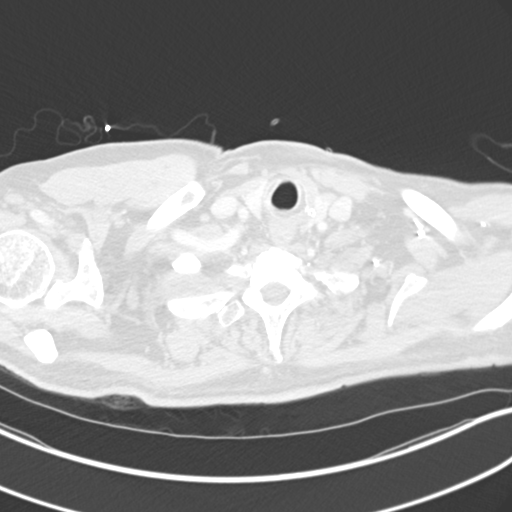

[Series 5: coronal · coronal · 0.60mm/px · 3 of 124 slices shown]
[im 25/124  lung]
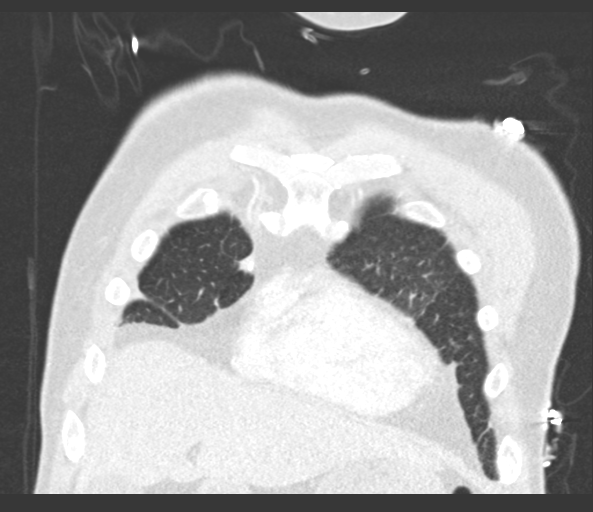
[im 50/124  lung]
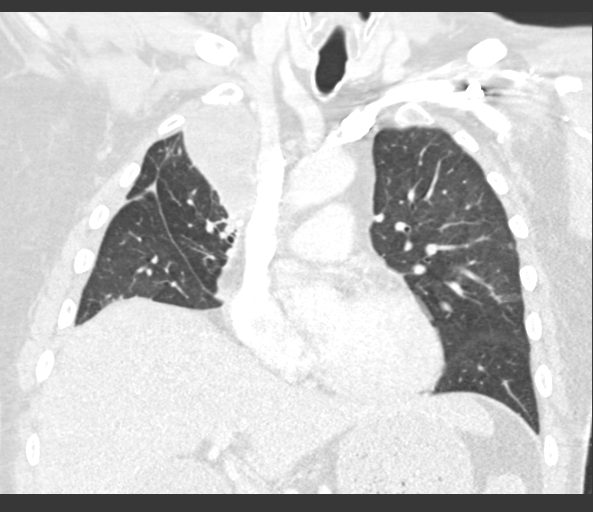
[im 74/124  lung]
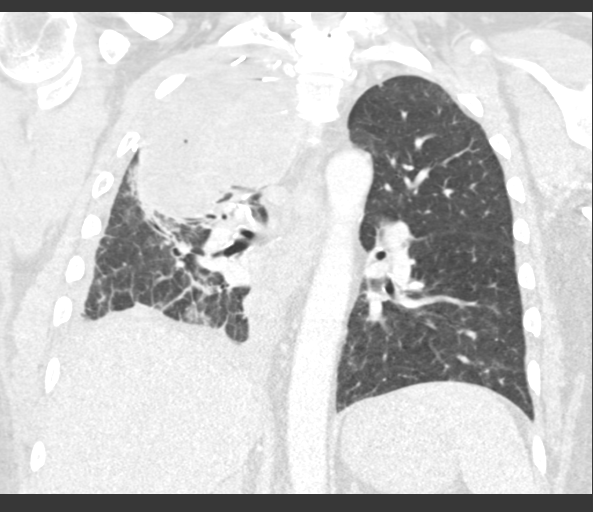

[13 of 36 positions shown; findings below may reference images not displayed]

FINDINGS: Cardiovascular: Left anterior descending coronary artery
atherosclerosis along with mild thoracic aortic atherosclerotic
calcification.

Mediastinum/Nodes: Stable small scattered mediastinal lymph nodes.

Lungs/Pleura: There has been removal of the pigtail catheter from
the large complex process involving the right lung apex with
extension into the right middle lobe and possibly the right lower
lobe. Prior right upper lobectomy. The complex right apical process
measures about 11.7 by 8.3 by 11.1 cm (volume = 560 cm^3) and
contains mostly complex material with some scattered mixed and gas,
and with a relatively stable appearing cavitary component in the
upper portion of the left lower lobe. When I measure this process in
the same fashion on the [DATE] exam I arrive at a size of
by 8.3 by 12.1 cm (volume = 620 cm^3), and is this process is
minimally reduced in volume compared to [DATE].

Small amount of airspace opacity anteriorly in the right middle lobe
on image 55 of series 4 previously had ground-glass opacity and has
currently mildly increased in density. Secondary pulmonary lobular
interstitial accentuation at the right lung base with scattered
ground-glass opacities. A small right pleural effusion is observed,
and medially along this pleural effusion on image 87 series 2 there
is a 2.5 by 2.5 cm nodule or complex lesion which previously
measured about 2.0 by 1.6 cm. I cannot exclude a tumor deposit in
this vicinity. Volume loss and ground-glass opacity along the
margins of the extension of the complex right apical fluid
collection into the right lower lobe noted. This is similar to
prior.

Slight improvement in the scattered ground-glass opacities in the
left lung compared to previous. Small clustered nodules in the left
upper lobe on images 71-78 of series 4 measuring up to about 0.3 cm
in diameter, similar to prior. Right subpleural nodule 0.7 cm in
diameter on image 108 series 4, previously 0.5 cm. Right subpleural
nodule 0.9 by 0.8 cm on image 110 series 4, previously 0.7 by
cm. A peripheral right lower lobe nodule measures up to 0.6 cm in
diameter on image 114 series 4, previously 0.4 cm. Accordingly some
of the left basilar nodules are enlarging. There is a new 4 mm left
lower lobe subpleural nodule on image 98 series 4.

Upper Abdomen: Fullness of the right adrenal gland is incompletely
characterized.

Musculoskeletal: Stable right fifth rib fracture. Screw tracks in
the right fifth and sixth ribs.
IMPRESSION: 1. Right upper lobectomy with complex right apical collection
primarily with mixed density measuring 560 cubic cm, slightly
reduced from prior measurement of 620 cubic cm on [DATE]. This
continues to bulge down into the right middle lobe and right lower
lobe with a right lower lobe superior segmental cavitary component
as on previous exams. The pigtail catheter has been removed from
this collection.
2. Waxing and waning appearance of other findings, including mostly
improved scattered ground-glass densities in the left lung although
with some increase in nodularity in the left lower lobe which is
nonspecific and could be from atypical infection (less likely
malignant spread). Similar degree of secondary pulmonary lobular
interstitial accentuation at the right lung base with scattered
ground-glass opacities.
3. Increase in size of a 2.5 cm in diameter complex region medially
along the left pleural effusion. I cannot exclude pleural tumor.
This previously measured 2.0 by 1.6 cm. Surveillance will likely be
warranted.
4. Given the widespread scattered complex pulmonary opacities and
especially the findings in the right lung, superimposed infection is
not excluded as a potential cause for fever.
5. Stable subacute right fifth rib fracture with screw tracks in the
right fifth and sixth ribs.

## 2021-04-17 IMAGING — DX DG CHEST 1V PORT
1 series · 1 of 1 positions shown · non-contrast
Comparison: [DATE]

CLINICAL DATA: Shortness of breath with fevers

EXAM:
PORTABLE CHEST 1 VIEW

[chest ap]
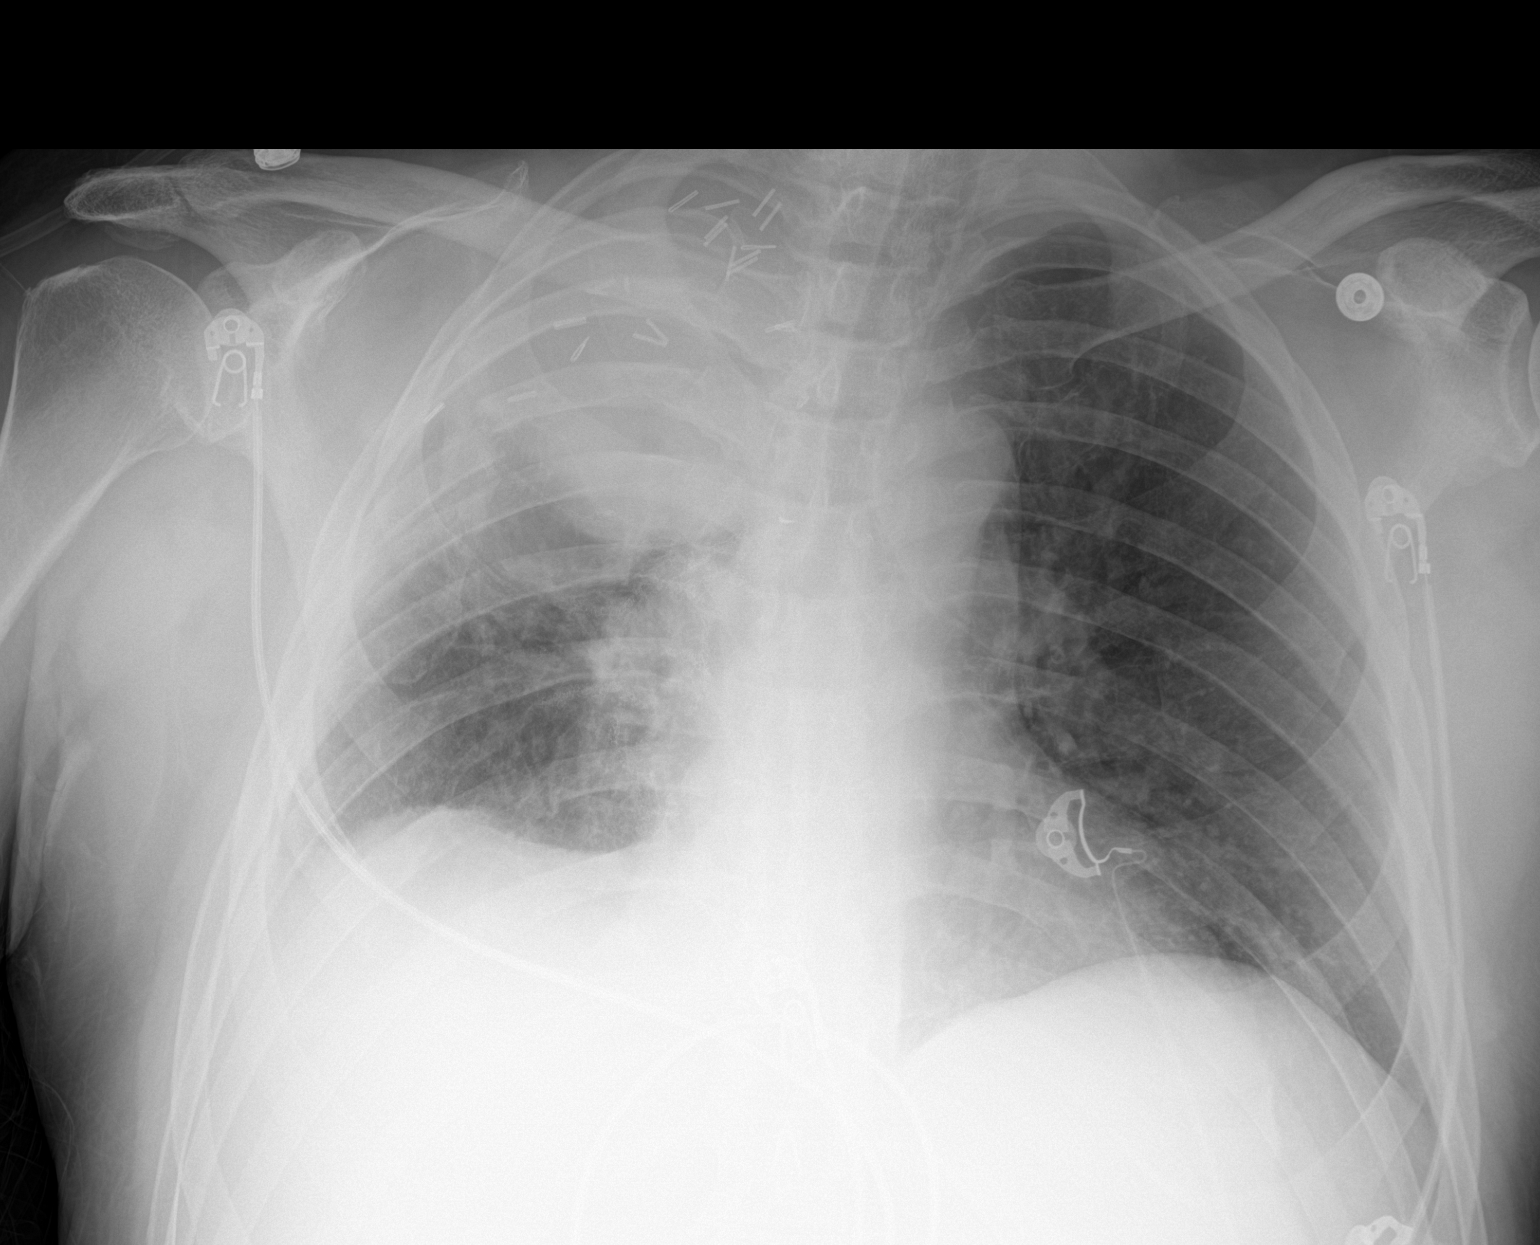

[1 of 1 positions shown; findings below may reference images not displayed]

FINDINGS: Cardiac shadow is stable. Left lung is clear. Right lung again
demonstrates soft tissue density in the apex stable in appearance
from the prior exam. Postsurgical changes are noted in the right
apex. No new focal abnormality is noted.
IMPRESSION: Stable appearance of the chest when compare with the prior exam.

## 2021-04-17 MED ORDER — ASCORBIC ACID 500 MG PO TABS
1000.0000 mg | ORAL_TABLET | Freq: Every day | ORAL | Status: DC
  Administered 2021-04-17 – 2021-04-25 (×9): 1000 mg via ORAL
  Filled 2021-04-17 (×9): qty 2

## 2021-04-17 MED ORDER — IPRATROPIUM-ALBUTEROL 0.5-2.5 (3) MG/3ML IN SOLN
3.0000 mL | Freq: Four times a day (QID) | RESPIRATORY_TRACT | Status: DC | PRN
  Administered 2021-04-18 – 2021-04-24 (×5): 3 mL via RESPIRATORY_TRACT
  Filled 2021-04-17 (×5): qty 3

## 2021-04-17 MED ORDER — FENTANYL 25 MCG/HR TD PT72
1.0000 | MEDICATED_PATCH | TRANSDERMAL | Status: DC
Start: 2021-04-20 — End: 2021-04-17

## 2021-04-17 MED ORDER — DIAZEPAM 2 MG PO TABS
2.0000 mg | ORAL_TABLET | Freq: Two times a day (BID) | ORAL | Status: DC
  Administered 2021-04-17 – 2021-04-18 (×3): 2 mg via ORAL
  Filled 2021-04-17 (×3): qty 1

## 2021-04-17 MED ORDER — METRONIDAZOLE 500 MG/100ML IV SOLN
500.0000 mg | Freq: Two times a day (BID) | INTRAVENOUS | Status: DC
  Administered 2021-04-17: 500 mg via INTRAVENOUS
  Filled 2021-04-17: qty 100

## 2021-04-17 MED ORDER — LACTATED RINGERS IV SOLN: INTRAVENOUS | Status: AC

## 2021-04-17 MED ORDER — LACTATED RINGERS IV BOLUS (SEPSIS)
1000.0000 mL | Freq: Once | INTRAVENOUS | Status: DC
Start: 2021-04-17 — End: 2021-04-17

## 2021-04-17 MED ORDER — IPRATROPIUM-ALBUTEROL 0.5-2.5 (3) MG/3ML IN SOLN
3.0000 mL | Freq: Two times a day (BID) | RESPIRATORY_TRACT | Status: DC
  Administered 2021-04-17 (×2): 3 mL via RESPIRATORY_TRACT
  Filled 2021-04-17: qty 3

## 2021-04-17 MED ORDER — INSULIN ASPART 100 UNIT/ML IJ SOLN
0.0000 [IU] | Freq: Three times a day (TID) | INTRAMUSCULAR | Status: DC
  Administered 2021-04-17 – 2021-04-18 (×3): 2 [IU] via SUBCUTANEOUS
  Administered 2021-04-18: 3 [IU] via SUBCUTANEOUS
  Administered 2021-04-18: 2 [IU] via SUBCUTANEOUS
  Filled 2021-04-17: qty 0.15

## 2021-04-17 MED ORDER — CHLORHEXIDINE GLUCONATE CLOTH 2 % EX PADS
6.0000 | MEDICATED_PAD | Freq: Every day | CUTANEOUS | Status: DC
  Administered 2021-04-17 – 2021-04-25 (×7): 6 via TOPICAL

## 2021-04-17 MED ORDER — IPRATROPIUM-ALBUTEROL 0.5-2.5 (3) MG/3ML IN SOLN
RESPIRATORY_TRACT | Status: AC
  Filled 2021-04-17: qty 3

## 2021-04-17 MED ORDER — SODIUM CHLORIDE 0.9 % IV SOLN
2.0000 g | Freq: Once | INTRAVENOUS | Status: AC
  Administered 2021-04-17: 2 g via INTRAVENOUS
  Filled 2021-04-17: qty 2

## 2021-04-17 MED ORDER — SODIUM CHLORIDE 0.9 % IV SOLN
2.0000 g | Freq: Three times a day (TID) | INTRAVENOUS | Status: DC
  Administered 2021-04-17 – 2021-04-19 (×6): 2 g via INTRAVENOUS
  Filled 2021-04-17 (×6): qty 2

## 2021-04-17 MED ORDER — GABAPENTIN 300 MG PO CAPS
300.0000 mg | ORAL_CAPSULE | Freq: Two times a day (BID) | ORAL | Status: DC
  Administered 2021-04-17 – 2021-04-25 (×16): 300 mg via ORAL
  Filled 2021-04-17 (×17): qty 1

## 2021-04-17 MED ORDER — SODIUM CHLORIDE 0.9 % IV SOLN
0.1000 mg/kg/h | INTRAVENOUS | Status: DC
  Administered 2021-04-17: 0.1 mg/kg/h via INTRAVENOUS
  Administered 2021-04-18 – 2021-04-19 (×3): 1 mg/kg/h via INTRAVENOUS
  Administered 2021-04-19 (×2): 0.5 mg/kg/h via INTRAVENOUS
  Administered 2021-04-20: 0.1 mg/kg/h via INTRAVENOUS
  Filled 2021-04-17 (×12): qty 5

## 2021-04-17 MED ORDER — MORPHINE SULFATE 15 MG PO TABS
30.0000 mg | ORAL_TABLET | ORAL | Status: DC | PRN
  Administered 2021-04-18 (×2): 30 mg via ORAL
  Filled 2021-04-17 (×2): qty 2

## 2021-04-17 MED ORDER — FENTANYL CITRATE (PF) 100 MCG/2ML IJ SOLN
50.0000 ug | Freq: Once | INTRAMUSCULAR | Status: AC
  Administered 2021-04-17: 50 ug via INTRAVENOUS
  Filled 2021-04-17: qty 2

## 2021-04-17 MED ORDER — GABAPENTIN 300 MG PO CAPS: 300.0000 mg | ORAL_CAPSULE | ORAL | Status: DC

## 2021-04-17 MED ORDER — METRONIDAZOLE 500 MG/100ML IV SOLN
500.0000 mg | Freq: Once | INTRAVENOUS | Status: AC
  Administered 2021-04-17: 500 mg via INTRAVENOUS
  Filled 2021-04-17: qty 100

## 2021-04-17 MED ORDER — INSULIN ASPART 100 UNIT/ML IJ SOLN
0.0000 [IU] | Freq: Every day | INTRAMUSCULAR | Status: DC
  Filled 2021-04-17: qty 0.05

## 2021-04-17 MED ORDER — VITAMIN B-12 1000 MCG PO TABS
1000.0000 ug | ORAL_TABLET | Freq: Every day | ORAL | Status: DC
  Administered 2021-04-17 – 2021-04-18 (×2): 1000 ug via ORAL
  Filled 2021-04-17 (×2): qty 1

## 2021-04-17 MED ORDER — IPRATROPIUM-ALBUTEROL 0.5-2.5 (3) MG/3ML IN SOLN
3.0000 mL | Freq: Two times a day (BID) | RESPIRATORY_TRACT | Status: DC
  Administered 2021-04-18 – 2021-04-25 (×12): 3 mL via RESPIRATORY_TRACT
  Filled 2021-04-17 (×15): qty 3

## 2021-04-17 MED ORDER — POLYETHYLENE GLYCOL 3350 17 G PO PACK: 17.0000 g | PACK | Freq: Every day | ORAL | Status: DC | PRN

## 2021-04-17 MED ORDER — BENZONATATE 100 MG PO CAPS: 200.0000 mg | ORAL_CAPSULE | Freq: Two times a day (BID) | ORAL | Status: DC | PRN

## 2021-04-17 MED ORDER — FENTANYL 100 MCG/HR TD PT72
1.0000 | MEDICATED_PATCH | TRANSDERMAL | Status: DC
  Administered 2021-04-20 – 2021-04-23 (×2): 1 via TRANSDERMAL
  Filled 2021-04-17 (×2): qty 1

## 2021-04-17 MED ORDER — MORPHINE SULFATE (PF) 4 MG/ML IV SOLN: 4.0000 mg | INTRAVENOUS | Status: DC | PRN

## 2021-04-17 MED ORDER — GABAPENTIN 300 MG PO CAPS
600.0000 mg | ORAL_CAPSULE | Freq: Every day | ORAL | Status: DC
  Administered 2021-04-17 – 2021-04-24 (×5): 600 mg via ORAL
  Filled 2021-04-17 (×7): qty 2

## 2021-04-17 MED ORDER — VANCOMYCIN HCL 1250 MG/250ML IV SOLN
1250.0000 mg | Freq: Two times a day (BID) | INTRAVENOUS | Status: DC
  Administered 2021-04-17 – 2021-04-18 (×2): 1250 mg via INTRAVENOUS
  Filled 2021-04-17 (×2): qty 250

## 2021-04-17 MED ORDER — FENTANYL CITRATE (PF) 100 MCG/2ML IJ SOLN
25.0000 ug | INTRAMUSCULAR | Status: DC | PRN
  Administered 2021-04-17 – 2021-04-18 (×4): 25 ug via INTRAVENOUS
  Filled 2021-04-17 (×5): qty 2

## 2021-04-17 MED ORDER — DIAZEPAM 5 MG/ML IJ SOLN
5.0000 mg | INTRAMUSCULAR | Status: DC | PRN
  Administered 2021-04-18 – 2021-04-25 (×9): 5 mg via INTRAVENOUS
  Filled 2021-04-17 (×14): qty 2

## 2021-04-17 MED ORDER — PANTOPRAZOLE SODIUM 40 MG PO TBEC
40.0000 mg | DELAYED_RELEASE_TABLET | Freq: Every day | ORAL | Status: DC
  Administered 2021-04-17 – 2021-04-25 (×9): 40 mg via ORAL
  Filled 2021-04-17 (×9): qty 1

## 2021-04-17 MED ORDER — VANCOMYCIN HCL 1500 MG/300ML IV SOLN
1500.0000 mg | Freq: Once | INTRAVENOUS | Status: AC
  Administered 2021-04-17: 1500 mg via INTRAVENOUS
  Filled 2021-04-17: qty 300

## 2021-04-17 MED ORDER — FERROUS SULFATE 325 (65 FE) MG PO TABS
325.0000 mg | ORAL_TABLET | Freq: Every day | ORAL | Status: DC
  Administered 2021-04-18: 325 mg via ORAL
  Filled 2021-04-17 (×2): qty 1

## 2021-04-17 MED ORDER — LACTATED RINGERS IV BOLUS (SEPSIS)
1000.0000 mL | Freq: Once | INTRAVENOUS | Status: AC
Start: 2021-04-17 — End: 2021-04-17
  Administered 2021-04-17: 1000 mL via INTRAVENOUS

## 2021-04-17 MED ORDER — CELECOXIB 200 MG PO CAPS
200.0000 mg | ORAL_CAPSULE | Freq: Two times a day (BID) | ORAL | Status: DC
  Administered 2021-04-17 – 2021-04-18 (×3): 200 mg via ORAL
  Filled 2021-04-17 (×4): qty 1

## 2021-04-17 MED ORDER — SODIUM CHLORIDE 0.9 % IV SOLN: INTRAVENOUS | Status: DC

## 2021-04-17 MED ORDER — ONDANSETRON HCL 4 MG PO TABS: 4.0000 mg | ORAL_TABLET | Freq: Four times a day (QID) | ORAL | Status: DC | PRN

## 2021-04-17 MED ORDER — MORPHINE SULFATE 15 MG PO TABS
30.0000 mg | ORAL_TABLET | ORAL | Status: DC | PRN
Start: 2021-04-17 — End: 2021-04-17

## 2021-04-17 MED ORDER — ACETAMINOPHEN 500 MG PO TABS
500.0000 mg | ORAL_TABLET | Freq: Three times a day (TID) | ORAL | Status: DC | PRN
  Administered 2021-04-21: 500 mg via ORAL
  Filled 2021-04-17: qty 1

## 2021-04-17 MED ORDER — VANCOMYCIN HCL IN DEXTROSE 1-5 GM/200ML-% IV SOLN: 1000.0000 mg | Freq: Once | INTRAVENOUS | Status: DC

## 2021-04-17 MED ORDER — IOHEXOL 350 MG/ML SOLN
75.0000 mL | Freq: Once | INTRAVENOUS | Status: AC | PRN
  Administered 2021-04-17: 75 mL via INTRAVENOUS

## 2021-04-17 MED ORDER — LACTATED RINGERS IV BOLUS (SEPSIS): 500.0000 mL | Freq: Once | INTRAVENOUS | Status: DC

## 2021-04-17 MED ORDER — ACETAMINOPHEN 500 MG PO TABS: 1000.0000 mg | ORAL_TABLET | Freq: Once | ORAL | Status: DC

## 2021-04-17 MED ORDER — LATANOPROST 0.005 % OP SOLN
1.0000 [drp] | Freq: Every day | OPHTHALMIC | Status: DC
  Administered 2021-04-17 – 2021-04-24 (×5): 1 [drp] via OPHTHALMIC
  Filled 2021-04-17: qty 2.5

## 2021-04-17 MED ORDER — ONDANSETRON HCL 4 MG/2ML IJ SOLN
4.0000 mg | Freq: Four times a day (QID) | INTRAMUSCULAR | Status: DC | PRN
  Administered 2021-04-19 – 2021-04-21 (×3): 4 mg via INTRAVENOUS
  Filled 2021-04-17 (×3): qty 2

## 2021-04-17 MED ORDER — SERTRALINE HCL 50 MG PO TABS
100.0000 mg | ORAL_TABLET | Freq: Every day | ORAL | Status: DC
  Administered 2021-04-17 – 2021-04-25 (×9): 100 mg via ORAL
  Filled 2021-04-17 (×8): qty 2

## 2021-04-17 NOTE — Progress Notes (Signed)
Patient seen in consultation. He has had increasing opioid needs. I discussed ketamine infusion while admitted for severe pain and increasing opioid tolerance. Will request step-down bed. Discussed with PCCM at bedside and will contact hospitalist.  Lane Hacker, DO Palliative Medicine

## 2021-04-17 NOTE — ED Triage Notes (Signed)
Pt to ED via EMS from home with c/o shortness of breath, fever, and weakness.  Pt being treated for lung cancer.  Tylenol given at home prior to EMS arrival, unknown amount.

## 2021-04-17 NOTE — Progress Notes (Signed)
Patient admitted to Room 1229 wife is concerned that patient is confused and that his short term memory is failing.

## 2021-04-17 NOTE — Progress Notes (Signed)
Pharmacy Antibiotic Note  Christopher Carter is a 60 y.o. male recently admitted 7/10-8/2 with hemoptysis and increased O2 requiring multiple round of antibiotics. CT removed 8/1. Patient admitted on 04/17/2021 with febrile illness/SIRS of unknown source.  Pharmacy has been consulted for cefepime and vancomycin dosing.  Plan: Vancomycin 1500 mg iv once followed by  1250 mg IV Q 12 hrs. Goal AUC 400-550. Expected AUC: 475 SCr used: 0.8  Cefepime 2 g iv q 8 hours  Flagyl 500 mg q 12 hours per MD  F/U MRSA PCR, culture results, renal function   Height: 5\' 10"  (177.8 cm) Weight: 80.7 kg (178 lb) IBW/kg (Calculated) : 73  Temp (24hrs), Avg:101.3 F (38.5 C), Min:98.9 F (37.2 C), Max:103.7 F (39.8 C)  Recent Labs  Lab 04/17/21 0323  WBC 7.7  CREATININE 0.65  LATICACIDVEN 0.7    Estimated Creatinine Clearance: 103.9 mL/min (by C-G formula based on SCr of 0.65 mg/dL).    Allergies  Allergen Reactions   Oxycodone Hcl Other (See Comments)    Pt reported "seeing things" and " feeling unusual."   Bupropion Nausea And Vomiting    Antimicrobials this admission: 8/9 vancomycin >>  8/9 cefeipme >>  8/9 Flagyl >>   Dose adjustments this admission:  Microbiology results: 8/9 BCx:  8/9 MRSA PCR:   Thank you for allowing pharmacy to be a part of this patient's care.  Ulice Dash D 04/17/2021 10:21 AM

## 2021-04-17 NOTE — Consult Note (Signed)
Palliative Care  Consultation Reason: Pain Management -Goals of Care  Christopher Carter is a 60 year old man well known to me and the palliative care team from recent prolonged hospitalizations related to newly diagnosed lung cancer and complications that followed right upper lobe lobectomy that included rapid recurrence of lung cancer at the resection site and disease that was not completely resectable with clean margins. He also developed a cavitary lung mass that required placement of chest tube and resulted in difficult to manage respiratory symptoms from hemoptysis, recurrent infection and coughing. He has had significant pain in the area of his right thoracic outlet and right scapular pain as well as persistent lower back pain. I have been managing him in the outpatient setting and over the past few days he has needed additional opioid dosing and management. This morning [name] found him difficult to arouse, diaphoretic and extremely hot to touch. He had a fever of 104 and she called EMS. He is being admitted for sepsis and further evaluation and treatment of infection- likely pulmonary.  I met with Christopher Carter, his wife and the pulmonary team at bedside today in the ED. Christopher Carter is increasingly exhausted and frustrated by these setbacks, frequency of complications and ongoing pain and loss of functional status, although he had been doing mostly well and making slow progress since being discharged. Mentally and emotionally he is struggling to cope with all aspects of his condition and illness. Prior to the lobectomy the goal was cure and through the course of events his cancer has progressed and will not be curable -only treatable and manageable -his prognosis is largely uncertain because he continues to be infection free or resolve the problematic area in his post surgical right upper lobe lung cavity along with other concerning areas in his left lung. His depression has been difficult recently and contributing  to his overall level of suffering. . Recommendations:  DNR. Additional conversation at bedside related to "what if scenario". Reassured him full scope treatment would be provided and we discussed hope and uncertainty. He is now requiring large doses of opioids- there are likely components of both physical and existential pain. Moving forward an opioid sparing and conserving approach will make it easier to control his pain in the future- he doesn't have significant bony disease at least on recent CT imaging to explain the severity of his pain-especially in his lower back but this warrants careful monitoring and further diagnostics if needed. Since he has severe depression and pain with high opioid tolerance I have recommended using a ketamine infusion while he is in the hospital being treated for infection. Ketamine-low dose palliative protocol Maintain fentanyl duragesic dosing- will have IV fentanyl available as back up for ketamine titraton Continue low dose decadron and celebrex. Valium PRN for agitation or anxiety. Step-down ICU for Ketamine administration IV antibiotics and lung pathology per pulmonary or CVTS Patient has a known dental abscess that he has not had treated yet-consider evaluation as potential source of infection as well.  Will follow closely.  Christopher Hacker, DO Palliative Medicine   Time: 70 minutes Greater than 50%  of this time was spent counseling and coordinating care related to the above assessment and plan.

## 2021-04-17 NOTE — ED Provider Notes (Signed)
Physical Exam  BP 123/86   Pulse (!) 103   Temp 98.9 F (37.2 C) (Oral)   Resp (!) 8   Ht 5\' 10"  (1.778 m)   Wt 80.7 kg   SpO2 99%   BMI 25.54 kg/m   Physical Exam Vitals and nursing note reviewed.  Constitutional:      General: He is not in acute distress.    Appearance: He is well-developed. He is not diaphoretic.  HENT:     Head: Normocephalic and atraumatic.  Eyes:     General: No scleral icterus.    Conjunctiva/sclera: Conjunctivae normal.  Pulmonary:     Effort: Pulmonary effort is normal. No respiratory distress.  Musculoskeletal:     Cervical back: Normal range of motion.  Skin:    Findings: No rash.  Neurological:     Mental Status: He is alert.    ED Course/Procedures   Clinical Course as of 04/17/21 0805  Tue Apr 17, 2021  0805 Pulse Rate(!): 103 [HK]    Clinical Course User Index [HK] Delia Heady, PA-C    Procedures  MDM   Care of patient assumed from PA [place] at 7 AM.  Agree with history, physical exam and plan.  See their note for further details.  Briefly, 60 y.o. male with PMH/PSH as below who presents with fever, diaphoresis and tachycardia.  Admission and discharge last week for recurrent pneumonia and lung abscesses.  He has been in and out of the hospital frequently for this.  He was discharged with amoxicillin last week.  Denies any coughing, worsening shortness of breath, abdominal pain or chest pain. Patient tachycardic and febrile on arrival here.   Code sepsis was initiated and patient was given broad-spectrum antibiotics CBC and lactic acid level are unremarkable. Chest x-ray shows stable findings. His tachycardia and fever have improved here with medication and fluids.  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   Anxiety    Depression    Hypertension    Pneumonia    1990's   Past Surgical History:  Procedure Laterality Date   ANKLE FRACTURE SURGERY Right    BRONCHIAL BIOPSY  01/05/2021   Procedure:  BRONCHIAL BIOPSIES;  Surgeon: Collene Gobble, MD;  Location: [place];  Service: Cardiopulmonary;;   BRONCHIAL BRUSHINGS  01/05/2021   Procedure: BRONCHIAL BRUSHINGS;  Surgeon: Collene Gobble, MD;  Location: [place];  Service: Cardiopulmonary;;   BRONCHIAL NEEDLE ASPIRATION BIOPSY  01/05/2021   Procedure: BRONCHIAL NEEDLE ASPIRATION BIOPSIES;  Surgeon: Collene Gobble, MD;  Location: [place];  Service: Cardiopulmonary;;   COLON SURGERY  2017   bowel obstruction surgery per pt    ENDOBRONCHIAL ULTRASOUND N/A 01/05/2021   Procedure: ENDOBRONCHIAL ULTRASOUND;  Surgeon: Collene Gobble, MD;  Location: [place];  Service: Cardiopulmonary;  Laterality: N/A;   INTERCOSTAL NERVE BLOCK Right 01/22/2021   Procedure: INTERCOSTAL NERVE BLOCK;  Surgeon: [name], MD;  Location: [place];  Service: Thoracic;  Laterality: Right;   LAPAROSCOPY N/A 04/25/2017   Procedure: LAPAROSCOPY ASSISTED ILEO-CECECTOMY WITH SMALL BOWEL RESECTION WITH ANASTOMOSIS;  Surgeon: Excell Seltzer, MD;  Location: [facility];  Service: General;  Laterality: N/A;   LYMPH NODE DISSECTION Right 01/22/2021   Procedure: LYMPH NODE DISSECTION;  Surgeon: [name], MD;  Location: [place];  Service: Thoracic;  Laterality: Right;   THORACOTOMY Right 01/22/2021   Procedure: THORACOTOMY;  Surgeon: [name], MD;  Location: [place];  Service: Thoracic;  Laterality: Right;   VIDEO ASSISTED THORACOSCOPY (VATS)/ LOBECTOMY Right 01/22/2021   Procedure: VIDEO ASSISTED THORACOSCOPY (VATS)/RIGHT UPPER LOBECTOMY;  Surgeon: [name], MD;  Location: [place];  Service: Thoracic;  Laterality: Right;   VIDEO BRONCHOSCOPY N/A 01/05/2021   Procedure: VIDEO BRONCHOSCOPY WITH FLUORO;  Surgeon: Collene Gobble, MD;  Location: [place];  Service: Cardiopulmonary;  Laterality: N/A;   VIDEO BRONCHOSCOPY N/A 03/19/2021   Procedure: VIDEO BRONCHOSCOPY WITHOUT FLUORO;  Surgeon: [name], DO;  Location: [place]  [facility];  Service: Endoscopy;  Laterality: N/A;  need cryo probe available      Current Plan: Obtain CT of the chest to rule out complication of prior surgery versus infection.   MDM/ED Course: CT shows somewhat stable findings but there is concern for superimposed infection.  I feel that with his constellation of symptoms and his history that he will require admission in the hospital for ongoing management of this recurrent infection.  He has already been given antibiotics and antipyretics.   Consults: Hospitalist Dr. Marylyn Ishihara   Significant labs/images: Labs Reviewed  COMPREHENSIVE METABOLIC PANEL - Abnormal; Notable for the following components:      Result Value   Glucose, Bld 158 (*)    Total Protein 6.2 (*)    Albumin 3.1 (*)    AST 10 (*)    All other components within normal limits  CBC WITH DIFFERENTIAL/PLATELET - Abnormal; Notable for the following components:   RBC 2.95 (*)    Hemoglobin 8.9 (*)    HCT 28.4 (*)    RDW 16.7 (*)    Lymphs Abs 0.2 (*)    Abs Immature Granulocytes 0.11 (*)    All other components within normal limits  RAPID URINE DRUG SCREEN, HOSP PERFORMED - Abnormal; Notable for the following components:   Opiates POSITIVE (*)    Benzodiazepines POSITIVE (*)    All other components within normal limits  CULTURE, BLOOD (ROUTINE X 2)  CULTURE, BLOOD (ROUTINE X 2)  SARS CORONAVIRUS 2 (TAT 6-24 HRS)  RESP PANEL BY RT-PCR (FLU A&B, COVID) ARPGX2  LACTIC ACID, PLASMA  PROTIME-INR  URINALYSIS, ROUTINE W REFLEX MICROSCOPIC     I personally reviewed and interpreted all labs.     Delia Heady, PA-C 04/17/21 7412    Milton Ferguson, MD 04/22/21 [phone]

## 2021-04-17 NOTE — ED Notes (Signed)
Called 2nd floor to get name of nurse to get report to. Per Network engineer, RN has not been assigned yet.

## 2021-04-17 NOTE — ED Notes (Signed)
Bed placement called due to E. Hilma Favors putting in transfer order to stepdown. They need a new admission order. Page sent to Dr. Marylyn Ishihara.

## 2021-04-17 NOTE — ED Provider Notes (Signed)
[place] DEPT Provider Note   CSN: [id number] Arrival date & time: 04/17/21  0223     History Chief Complaint  Patient presents with   Fever    Suspected sepsis   Shortness of Breath    Christopher Carter is a 60 y.o. male.  The history is provided by the patient, the spouse and medical records. No language interpreter was used.  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  History obtained mostly through wife who is at bedside.  Wife mentioned patient has had recurrent pneumonia as well as lung abscess requiring multiple hospitalization within the past few months.  He was finally discharged less than a week ago and has been doing okay up until this morning when he was awake drenching sweats and felt warm to the touch as well as appearing to be confused and having difficulty concentrating.  Symptoms concerning prompting this ER visit.  Patient at this moment without significant complaint aside from having low back pain which appears to be an ongoing issue since he was hospitalized.  Back pain thought to be related to muscular pain from prolonged laying in bed as he has had previous evaluation.  No report of any coughing, worsening shortness of breath, nausea vomiting diarrhea dysuria abdominal pain or chest pain.  Denies any significant headache.  Patient has been vaccinated for COVID-19.  He has had COVID in the past.  Patient currently receiving radiation therapy for his lung cancer.  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   Thrombocytosis 03/18/2021   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   Acute on chronic respiratory failure with hypoxia ([place]) 02/26/2021   Malignant neoplasm of right upper lobe of lung ([place]) 02/26/2021   Pneumonia of right lung due to infectious organism 02/26/2021   Sepsis due to pneumonia ([place]) 02/26/2021   Major depressive disorder 02/26/2021   S/P lobectomy of lung 01/22/2021   Essential hypertension 01/15/2021   Mass of upper lobe of right lung 12/22/2020   Hemoptysis 12/22/2020    Past Surgical History:  Procedure Laterality Date   ANKLE FRACTURE SURGERY Right    BRONCHIAL BIOPSY  01/05/2021   Procedure: BRONCHIAL BIOPSIES;  Surgeon: Collene Gobble, MD;  Location: [place];  Service: Cardiopulmonary;;   BRONCHIAL BRUSHINGS  01/05/2021   Procedure: BRONCHIAL BRUSHINGS;  Surgeon: Collene Gobble, MD;  Location: [place];  Service: Cardiopulmonary;;   BRONCHIAL NEEDLE ASPIRATION BIOPSY  01/05/2021   Procedure: BRONCHIAL NEEDLE ASPIRATION BIOPSIES;  Surgeon: Collene Gobble, MD;  Location: [place];  Service: Cardiopulmonary;;   COLON SURGERY  2017   bowel obstruction surgery per pt    ENDOBRONCHIAL ULTRASOUND N/A 01/05/2021   Procedure: ENDOBRONCHIAL  ULTRASOUND;  Surgeon: Collene Gobble, MD;  Location: [facility] ENDOSCOPY;  Service: Cardiopulmonary;  Laterality: N/A;   INTERCOSTAL NERVE BLOCK Right 01/22/2021   Procedure: INTERCOSTAL NERVE BLOCK;  Surgeon: [name], MD;  Location: [place];  Service: Thoracic;  Laterality: Right;   LAPAROSCOPY N/A 04/25/2017   Procedure: LAPAROSCOPY ASSISTED ILEO-CECECTOMY WITH SMALL BOWEL RESECTION WITH ANASTOMOSIS;  Surgeon: Excell Seltzer, MD;   Location: [facility];  Service: General;  Laterality: N/A;   LYMPH NODE DISSECTION Right 01/22/2021   Procedure: LYMPH NODE DISSECTION;  Surgeon: [name], MD;  Location: [place];  Service: Thoracic;  Laterality: Right;   THORACOTOMY Right 01/22/2021   Procedure: THORACOTOMY;  Surgeon: [name], MD;  Location: [place];  Service: Thoracic;  Laterality: Right;   VIDEO ASSISTED THORACOSCOPY (VATS)/ LOBECTOMY Right 01/22/2021   Procedure: VIDEO ASSISTED THORACOSCOPY (VATS)/RIGHT UPPER LOBECTOMY;  Surgeon: [name], MD;  Location: [place];  Service: Thoracic;  Laterality: Right;   VIDEO BRONCHOSCOPY N/A 01/05/2021   Procedure: VIDEO BRONCHOSCOPY WITH FLUORO;  Surgeon: Collene Gobble, MD;  Location: [place];  Service: Cardiopulmonary;  Laterality: N/A;   VIDEO BRONCHOSCOPY N/A 03/19/2021   Procedure: VIDEO BRONCHOSCOPY WITHOUT FLUORO;  Surgeon: [name], DO;  Location: [place] ENDOSCOPY;  Service: Endoscopy;  Laterality: N/A;  need cryo probe available       No family history on file.  Social History   Tobacco Use   Smoking status: Never   Smokeless tobacco: Never  Vaping Use   Vaping Use: Never used  Substance Use Topics   Alcohol use: Yes    Alcohol/week: 22.0 standard drinks    Types: 1 Cans of beer, 21 Shots of liquor per week    Comment: 3-4 shots of whiskey a day   Drug use: No    Home Medications Prior to Admission medications   Medication Sig Start Date End Date Taking? Authorizing Provider  acetaminophen (TYLENOL) 500 MG tablet Take 1,000 mg by mouth every 8 (eight) hours as needed for moderate pain or headache.    [provider]  amoxicillin-clavulanate (AUGMENTIN) 875-125 MG tablet Take 1 tablet by mouth every 12 (twelve) hours for 14 days. 04/10/21 04/24/21  Donne Hazel, MD  amphetamine-dextroamphetamine (ADDERALL) 10 MG tablet Take 1 tablet (10 mg total) by mouth 2 (two) times daily. 04/11/21   Acquanetta Chain, DO  Ascorbic  Acid (VITAMIN C) 1000 MG tablet Take 1,000 mg by mouth daily.    [provider]  benzonatate (TESSALON) 200 MG capsule Take 1 capsule (200 mg total) by mouth 2 (two) times daily. 04/08/21   Acquanetta Chain, DO  blood glucose meter kit and supplies KIT Dispense based on patient and insurance preference. Use up to four times daily as directed. 04/10/21   Donne Hazel, MD  celecoxib (CELEBREX) 200 MG capsule Take 1 capsule (200 mg total) by mouth 2 (two) times daily. 04/08/21   Acquanetta Chain, DO  dexamethasone (DECADRON) 1 MG tablet Take 1 tablet (1 mg total) by mouth daily with breakfast. 04/16/21   Acquanetta Chain, DO  diazepam (VALIUM) 10 MG tablet Take 0.5-1 tablets (5-10 mg total) by mouth daily as needed for anxiety (30 minutes PRIOR to Radiation). 04/08/21   Acquanetta Chain, DO  diazepam (VALIUM) 2 MG tablet Take 1 tablet (2 mg total) by mouth every 12 (twelve) hours. 04/11/21   Acquanetta Chain, DO  fentaNYL (DURAGESIC) 100 MCG/HR Place 1 patch onto  the skin every 3 (three) days. 04/12/21 05/12/21  Acquanetta Chain, DO  fentaNYL (DURAGESIC) 25 MCG/HR This is in addition to the 100 mcg patch for total of 125 mcg q72 hours. 04/11/21   Acquanetta Chain, DO  ferrous sulfate 325 (65 FE) MG tablet Take 1 tablet (325 mg total) by mouth daily with breakfast. 04/11/21 05/11/21  Donne Hazel, MD  gabapentin (NEURONTIN) 300 MG capsule Take 300-600 mg by mouth See admin instructions. Takes 300 mg in the morning and afternoon and 600 mg at night 03/12/21   [provider]  glucose blood ([employer] GLUCOSE METER TEST STRIPS) test strip Use as instructed 04/12/21   Acquanetta Chain, DO  ipratropium-albuterol (DUONEB) 0.5-2.5 (3) MG/3ML SOLN Take 3 mLs by nebulization every 4 (four) hours as needed. 04/10/21   Acquanetta Chain, DO  irbesartan (AVAPRO) 75 MG tablet Take 1 tablet (75 mg total) by mouth daily. 04/11/21 05/11/21  Donne Hazel, MD  latanoprost (XALATAN) 0.005 %  ophthalmic solution Place 1 drop into both eyes at bedtime.    [provider]  lidocaine (LIDODERM) 5 % Place 1 patch onto the skin daily. Remove & Discard patch within 12 hours or as directed by MD 03/07/21   Regalado, Jerald Kief A, MD  morphine (MSIR) 30 MG tablet Take 1 tablet (30 mg total) by mouth every 4 (four) hours as needed for severe pain. 04/12/21   Acquanetta Chain, DO  pantoprazole (PROTONIX) 40 MG tablet Take 1 tablet (40 mg total) by mouth daily. 04/16/21   Acquanetta Chain, DO  polyethylene glycol (MIRALAX / GLYCOLAX) 17 g packet Take 17 g by mouth daily as needed for mild constipation. 03/07/21   Regalado, Belkys A, MD  QUEtiapine (SEROQUEL) 50 MG tablet Take 50 mg by mouth at bedtime. Take 50 mg for 3 nights then reduce to 25 mg for three nights, then 12.$RemoveBeforeD'5mg'[id number]$  every night for sleep 11/06/20   [provider]  sertraline (ZOLOFT) 100 MG tablet Take 100 mg by mouth daily. 10/29/20   [provider]  vitamin B-12 (CYANOCOBALAMIN) 1000 MCG tablet Take 1,000 mcg by mouth daily.    [provider]  furosemide (LASIX) 20 MG tablet Take 1 tablet (20 mg total) by mouth daily. Patient not taking: No sig reported 03/07/21 03/18/21  Regalado, Jerald Kief A, MD    Allergies    Oxycodone hcl and Bupropion  Review of Systems   Review of Systems  Unable to perform ROS: Mental status change  Constitutional:  Positive for fever.  Respiratory:  Positive for shortness of breath.    Physical Exam Updated Vital Signs BP (!) 158/88   Pulse (!) 124   Temp (!) 103.7 F (39.8 C) (Rectal)   Resp 14   Ht $R'5\' 10"'qz$  (1.778 m)   Wt 80.7 kg   SpO2 98%   BMI 25.54 kg/m   Physical Exam Vitals and nursing note reviewed.  Constitutional:      General: He is not in acute distress.    Appearance: He is well-developed. He is ill-appearing and diaphoretic.  HENT:     Head: Atraumatic.     Mouth/Throat:     Mouth: Mucous membranes are moist.  Eyes:     Conjunctiva/sclera:  Conjunctivae normal.  Cardiovascular:     Rate and Rhythm: Tachycardia present.     Pulses: Normal pulses.     Heart sounds: Normal heart sounds.  Pulmonary:     Effort: Pulmonary effort is normal.  Breath sounds: Normal breath sounds.  Abdominal:     Palpations: Abdomen is soft.     Tenderness: There is no abdominal tenderness.  Musculoskeletal:        General: Tenderness (Tenderness along lumbar and paralumbar spinal region palpation) present. Normal range of motion.     Cervical back: Normal range of motion and neck supple.  Skin:    Findings: No rash.  Neurological:     Mental Status: He is alert and oriented to person, place, and time.  Psychiatric:        Mood and Affect: Mood normal.    ED Results / Procedures / Treatments   Labs (all labs ordered are listed, but only abnormal results are displayed) Labs Reviewed  CBC WITH DIFFERENTIAL/PLATELET - Abnormal; Notable for the following components:      Result Value   RBC 2.95 (*)    Hemoglobin 8.9 (*)    HCT 28.4 (*)    RDW 16.7 (*)    Lymphs Abs 0.2 (*)    Abs Immature Granulocytes 0.11 (*)    All other components within normal limits  CULTURE, BLOOD (ROUTINE X 2)  CULTURE, BLOOD (ROUTINE X 2)  COMPREHENSIVE METABOLIC PANEL  LACTIC ACID, PLASMA  PROTIME-INR  URINALYSIS, ROUTINE W REFLEX MICROSCOPIC    EKG None  Radiology DG Chest Port 1 View  Result Date: 04/17/2021 CLINICAL DATA:  Shortness of breath with fevers EXAM: PORTABLE CHEST 1 VIEW COMPARISON:  04/10/2021 FINDINGS: Cardiac shadow is stable. Left lung is clear. Right lung again demonstrates soft tissue density in the apex stable in appearance from the prior exam. Postsurgical changes are noted in the right apex. No new focal abnormality is noted. IMPRESSION: Stable appearance of the chest when compare with the prior exam. Electronically Signed   By: Inez Catalina M.D.   On: 04/17/2021 03:09    Procedures .Critical Care  Date/Time: 04/17/2021 6:52  AM Performed by: Domenic Moras, PA-C Authorized by: Domenic Moras, PA-C   Critical care provider statement:    Critical care time (minutes):  32   Critical care was time spent personally by me on the following activities:  Discussions with consultants, evaluation of patient's response to treatment, examination of patient, ordering and performing treatments and interventions, ordering and review of laboratory studies, ordering and review of radiographic studies, pulse oximetry, re-evaluation of patient's condition, obtaining history from patient or surrogate and review of old charts   Medications Ordered in ED Medications  lactated ringers infusion ( Intravenous New Bag/Given 04/17/21 0454)  ceFEPIme (MAXIPIME) 2 g in sodium chloride 0.9 % 100 mL IVPB (2 g Intravenous New Bag/Given 04/17/21 0630)  metroNIDAZOLE (FLAGYL) IVPB 500 mg (has no administration in time range)  acetaminophen (TYLENOL) tablet 1,000 mg (has no administration in time range)  lactated ringers bolus 1,000 mL (0 mLs Intravenous Stopped 04/17/21 0448)  vancomycin (VANCOREADY) IVPB 1500 mg/300 mL (0 mg Intravenous Stopped 04/17/21 0625)  fentaNYL (SUBLIMAZE) injection 50 mcg (50 mcg Intravenous Given 04/17/21 0520)  iohexol (OMNIPAQUE) 350 MG/ML injection 75 mL (75 mLs Intravenous Contrast Given 04/17/21 7116)    ED Course  I have reviewed the triage vital signs and the nursing notes.  Pertinent labs & imaging results that were available during my care of the patient were reviewed by me and considered in my medical decision making (see chart for details).    MDM Rules/Calculators/A&P  BP 123/86   Pulse (!) 103   Temp 98.9 F (37.2 C) (Oral)   Resp (!) 8   Ht $R'5\' 10"'rr$  (1.778 m)   Wt 80.7 kg   SpO2 99%   BMI 25.54 kg/m   Final Clinical Impression(s) / ED Diagnoses Final diagnoses:  Fever    Rx / DC Orders ED Discharge Orders     None      3:48 AM Patient with significant history of lung  cancer who has had multiple lung infection in the past few months require hospital admission.  He is here due to fever as high as 103.7, tachycardic and is very diaphoretic.  The symptoms started this morning he was fine last night.  Today chest x-ray did not show any concerning finding, and it appears stable when compared to prior exam.  His white count is normal at 7.7.  He is anemic with a hemoglobin of 8.9 which is similar to prior baseline.  Lactic acid is currently pending.  I have initiated broad-spectrum antibiotic.  6:32 AM Normal lactic acid, COVID test is currently pending.  With his significant pulmonary history, it would be prudent to obtain chest CT with contrast for further assessment.  Fever did reduce with fever reducer.  Patient signed out to oncoming provider.   Domenic Moras, PA-C 04/17/21 8295    Maudie Flakes, MD 04/17/21 [phone]

## 2021-04-17 NOTE — ED Notes (Signed)
Secure chat from [place], RN ready to accept pt.   Wife at bedside, aware of transfer.

## 2021-04-17 NOTE — ED Notes (Signed)
Secure chat sent to IP Nurse for handoff.

## 2021-04-17 NOTE — Progress Notes (Signed)
A consult was received from an ED physician for Vancomycin and Cefepime per pharmacy dosing.  The patient's profile has been reviewed for ht/wt/allergies/indication/available labs.    A one time order has been placed for Vancomycin 1500mg  IV and Cefepime 2gm IV.    Further antibiotics/pharmacy consults should be ordered by admitting physician if indicated.                       Thank you, Everette Rank, PharmD 04/17/2021  3:48 AM

## 2021-04-17 NOTE — H&P (Signed)
History and Physical    Christopher Carter HGD:[id number] DOB: 19-Jan-1961 DOA: 04/17/2021  PCP: [name], MD  Patient coming from: home  Chief Complaint: fever  HPI: Christopher Carter is a 60 y.o. male with medical history significant of adenocarcinoma of the lung, HTN, anxiety/depression. Presenting with fevers, chills. He had a recent admission to the hospital on 03/18/21 for recurrent hemoptysis and increased O2 requirement.CT showed alveolar hemorrhage w/ a right apical fluid collection and a nodular lesion concerning recurrence of malignancy. Bronchoscopy did not reveal a source of bleed. He was transfer to [facility] for radiation. On 7/24 he began spiking fevers w/ tachycardia and tachypnea. New gas in complex right apical fluid collection was noted and concerning for communication w/ the airways. A drain was placed. Broad spec abx were introduced. He improved and the tube was removed on 04/09/21. He was discharged to home the next day.   He was in his normal state of health until early this morning. Around 1 am, his wife came in to give him his pain medicines. She noticed that he was running a fever. He had chills and seemed confused. She decided to called EMS for transfer to the ED. She denies any other aggravating or alleviating factors.  ED Course: CTA PE showed slight decrease of previously seen fluid collection, waxing/waning of scattered ground glass opacities, an increase in a possible left pleural tumor, possible superimposed infection. He was started on broad spec abx. [facility] was called for admission.   Review of Systems:  Review of systems is otherwise negative for all not mentioned in HPI.   PMHx 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   Anxiety    Depression    Hypertension    Pneumonia    1990's    PSHx Past Surgical History:  Procedure Laterality Date   ANKLE FRACTURE SURGERY Right    BRONCHIAL BIOPSY  01/05/2021   Procedure: BRONCHIAL  BIOPSIES;  Surgeon: Collene Gobble, MD;  Location: [place];  Service: Cardiopulmonary;;   BRONCHIAL BRUSHINGS  01/05/2021   Procedure: BRONCHIAL BRUSHINGS;  Surgeon: Collene Gobble, MD;  Location: [place];  Service: Cardiopulmonary;;   BRONCHIAL NEEDLE ASPIRATION BIOPSY  01/05/2021   Procedure: BRONCHIAL NEEDLE ASPIRATION BIOPSIES;  Surgeon: Collene Gobble, MD;  Location: [place];  Service: Cardiopulmonary;;   COLON SURGERY  2017   bowel obstruction surgery per pt    ENDOBRONCHIAL ULTRASOUND N/A 01/05/2021   Procedure: ENDOBRONCHIAL ULTRASOUND;  Surgeon: Collene Gobble, MD;  Location: [place];  Service: Cardiopulmonary;  Laterality: N/A;   INTERCOSTAL NERVE BLOCK Right 01/22/2021   Procedure: INTERCOSTAL NERVE BLOCK;  Surgeon: [name], MD;  Location: [place];  Service: Thoracic;  Laterality: Right;   LAPAROSCOPY N/A 04/25/2017   Procedure: LAPAROSCOPY ASSISTED ILEO-CECECTOMY WITH SMALL BOWEL RESECTION WITH ANASTOMOSIS;  Surgeon: Excell Seltzer, MD;  Location: [facility];  Service: General;  Laterality: N/A;   LYMPH NODE DISSECTION Right 01/22/2021   Procedure: LYMPH NODE DISSECTION;  Surgeon: [name], MD;  Location: [place];  Service: Thoracic;  Laterality: Right;   THORACOTOMY Right 01/22/2021   Procedure: THORACOTOMY;  Surgeon: [name], MD;  Location: [place];  Service: Thoracic;  Laterality: Right;   VIDEO ASSISTED THORACOSCOPY (VATS)/ LOBECTOMY Right 01/22/2021   Procedure: VIDEO ASSISTED THORACOSCOPY (VATS)/RIGHT UPPER LOBECTOMY;  Surgeon: [name], MD;  Location: [place];  Service: Thoracic;  Laterality: Right;   VIDEO BRONCHOSCOPY N/A 01/05/2021  Procedure: VIDEO BRONCHOSCOPY WITH FLUORO;  Surgeon: Collene Gobble, MD;  Location: [facility];  Service: Cardiopulmonary;  Laterality: N/A;   VIDEO BRONCHOSCOPY N/A 03/19/2021   Procedure: VIDEO BRONCHOSCOPY WITHOUT FLUORO;  Surgeon: [name], DO;  Location: [place] [facility]OSCOPY;   Service: Endoscopy;  Laterality: N/A;  need cryo probe available    SocHx  reports that he has never smoked. He has never used smokeless tobacco. He reports current alcohol use of about 22.0 standard drinks of alcohol per week. He reports that he does not use drugs.  Allergies  Allergen Reactions   Oxycodone Hcl Other (See Comments)    Pt reported "seeing things" and " feeling unusual."   Bupropion Nausea And Vomiting    FamHx No family history on file.  Prior to Admission medications   Medication Sig Start Date End Date Taking? Authorizing Provider  acetaminophen (TYLENOL) 500 MG tablet Take 500 mg by mouth every 8 (eight) hours as needed for moderate pain or headache.   Yes [provider]  amoxicillin-clavulanate (AUGMENTIN) 875-125 MG tablet Take 1 tablet by mouth every 12 (twelve) hours for 14 days. 04/10/21 04/24/21 Yes Donne Hazel, MD  Ascorbic Acid (VITAMIN C) 1000 MG tablet Take 1,000 mg by mouth daily.   Yes [provider]  benzonatate (TESSALON) 200 MG capsule Take 1 capsule (200 mg total) by mouth 2 (two) times daily. Patient taking differently: Take 200 mg by mouth 2 (two) times daily as needed for cough. 04/08/21  Yes Acquanetta Chain, DO  celecoxib (CELEBREX) 200 MG capsule Take 1 capsule (200 mg total) by mouth 2 (two) times daily. 04/08/21  Yes Lane Hacker L, DO  diazepam (VALIUM) 10 MG tablet Take 0.5-1 tablets (5-10 mg total) by mouth daily as needed for anxiety (30 minutes PRIOR to Radiation). 04/08/21  Yes Acquanetta Chain, DO  diazepam (VALIUM) 2 MG tablet Take 1 tablet (2 mg total) by mouth every 12 (twelve) hours. 04/11/21  Yes Acquanetta Chain, DO  fentaNYL (DURAGESIC) 100 MCG/HR Place 1 patch onto the skin every 3 (three) days. 04/12/21 05/12/21 Yes Acquanetta Chain, DO  fentaNYL (DURAGESIC) 25 MCG/HR This is in addition to the 100 mcg patch for total of 125 mcg q72 hours. 04/11/21  Yes Lane Hacker L, DO  ferrous sulfate 325  (65 FE) MG tablet Take 1 tablet (325 mg total) by mouth daily with breakfast. 04/11/21 05/11/21 Yes Donne Hazel, MD  gabapentin (NEURONTIN) 300 MG capsule Take 300-600 mg by mouth See admin instructions. Takes 300 mg in the morning $RemoveBef'300mg'tmuRTnrSMO$  in the afternoon and 600 mg at night 03/12/21  Yes [provider]  ipratropium-albuterol (DUONEB) 0.5-2.5 (3) MG/3ML SOLN Take 3 mLs by nebulization every 4 (four) hours as needed. Patient taking differently: Take 3 mLs by nebulization in the morning and at bedtime. 04/10/21  Yes Lane Hacker L, DO  latanoprost (XALATAN) 0.005 % ophthalmic solution Place 1 drop into both eyes at bedtime.   Yes [provider]  lidocaine (LIDODERM) 5 % Place 1 patch onto the skin daily. Remove & Discard patch within 12 hours or as directed by MD 03/07/21  Yes Regalado, Belkys A, MD  morphine (MSIR) 30 MG tablet Take 1 tablet (30 mg total) by mouth every 4 (four) hours as needed for severe pain. 04/12/21  Yes Acquanetta Chain, DO  polyethylene glycol (MIRALAX / GLYCOLAX) 17 g packet Take 17 g by mouth daily as needed for mild constipation. 03/07/21  Yes  Regalado, Belkys A, MD  sertraline (ZOLOFT) 100 MG tablet Take 100 mg by mouth daily. 10/29/20  Yes [provider]  vitamin B-12 (CYANOCOBALAMIN) 1000 MCG tablet Take 1,000 mcg by mouth daily.   Yes [provider]  amphetamine-dextroamphetamine (ADDERALL) 10 MG tablet Take 1 tablet (10 mg total) by mouth 2 (two) times daily. 04/11/21   Acquanetta Chain, DO  blood glucose meter kit and supplies KIT Dispense based on patient and insurance preference. Use up to four times daily as directed. 04/10/21   Donne Hazel, MD  dexamethasone (DECADRON) 1 MG tablet Take 1 tablet (1 mg total) by mouth daily with breakfast. Patient not taking: No sig reported 04/16/21   Lane Hacker L, DO  glucose blood ([employer] GLUCOSE METER TEST STRIPS) test strip Use as instructed 04/12/21   Acquanetta Chain, DO   irbesartan (AVAPRO) 75 MG tablet Take 1 tablet (75 mg total) by mouth daily. Patient not taking: Reported on 04/17/2021 04/11/21 05/11/21  Donne Hazel, MD  pantoprazole (PROTONIX) 40 MG tablet Take 1 tablet (40 mg total) by mouth daily. Patient not taking: Reported on 04/17/2021 04/16/21   Acquanetta Chain, DO  QUEtiapine (SEROQUEL) 50 MG tablet Take 50 mg by mouth at bedtime. Take 50 mg for 3 nights then reduce to 25 mg for three nights, then 12.$RemoveBeforeD'5mg'DFwDJuxeKLfIrF$  every night for sleep Patient not taking: Reported on 04/17/2021 11/06/20   [provider]  furosemide (LASIX) 20 MG tablet Take 1 tablet (20 mg total) by mouth daily. Patient not taking: No sig reported 03/07/21 03/18/21  Niel Hummer A, MD    Physical Exam: Vitals:   04/17/21 0630 04/17/21 0715 04/17/21 0734 04/17/21 0800  BP: 123/86 (!) 146/93 138/81 125/85  Pulse: (!) 103 (!) 111 (!) 107 (!) 103  Resp: (!) $RemoveB'8 10 11 10  'jeSSliGM$ Temp:      TempSrc:      SpO2: 99% 98% 98% 98%  Weight:      Height:        General: 60 y.o. male resting in bed in NAD Eyes: PERRL, normal sclera ENMT: Nares patent w/o discharge, orophaynx clear, dentition normal, ears w/o discharge/lesions/ulcers Neck: Supple, trachea midline Cardiovascular: RRR, +S1, S2, no m/g/r, equal pulses throughout Respiratory: decreased at bases, no w/r/r, slightly increased WOB on 3L GI: BS+, NDNT, no masses noted, no organomegaly noted MSK: No e/c/c Skin: No rashes, bruises, ulcerations noted Neuro: A&O x 3, no focal deficits Psyc: Appropriate interaction but flat affect, calm/cooperative  Labs on Admission: I have personally reviewed following labs and imaging studies  CBC: Recent Labs  Lab 04/17/21 0323  WBC 7.7  NEUTROABS 6.4  HGB 8.9*  HCT 28.4*  MCV 96.3  PLT 703   Basic Metabolic Panel: Recent Labs  Lab 04/17/21 0323  NA 138  K 4.3  CL 98  CO2 30  GLUCOSE 158*  BUN 9  CREATININE 0.65  CALCIUM 9.1   GFR: Estimated Creatinine Clearance: 103.9 mL/min  (by C-G formula based on SCr of 0.65 mg/dL). Liver Function Tests: Recent Labs  Lab 04/17/21 0323  AST 10*  ALT 12  ALKPHOS 71  BILITOT 0.7  PROT 6.2*  ALBUMIN 3.1*   No results for input(s): LIPASE, AMYLASE in the last 168 hours. No results for input(s): AMMONIA in the last 168 hours. Coagulation Profile: Recent Labs  Lab 04/17/21 0323  INR 1.0   Cardiac Enzymes: No results for input(s): CKTOTAL, CKMB, CKMBINDEX, TROPONINI in the last 168 hours.  BNP (last 3 results) No results for input(s): PROBNP in the last 8760 hours. HbA1C: No results for input(s): HGBA1C in the last 72 hours. CBG: Recent Labs  Lab 04/10/21 0839  GLUCAP 167*   Lipid Profile: No results for input(s): CHOL, HDL, LDLCALC, TRIG, CHOLHDL, LDLDIRECT in the last 72 hours. Thyroid Function Tests: No results for input(s): TSH, T4TOTAL, FREET4, T3FREE, THYROIDAB in the last 72 hours. Anemia Panel: No results for input(s): VITAMINB12, FOLATE, FERRITIN, TIBC, IRON, RETICCTPCT in the last 72 hours. Urine analysis:    Component Value Date/Time   COLORURINE YELLOW 04/17/2021 [place] 04/17/2021 0657   LABSPEC 1.009 04/17/2021 0657   PHURINE 5.0 04/17/2021 [place] 04/17/2021 0657   HGBUR NEGATIVE 04/17/2021 0657   [place] 04/17/2021 0657   [place] 04/17/2021 0657   PROTEINUR NEGATIVE 04/17/2021 0657   NITRITE NEGATIVE 04/17/2021 0657   LEUKOCYTESUR NEGATIVE 04/17/2021 0657    Radiological Exams on Admission: CT Chest W Contrast  Result Date: 04/17/2021 CLINICAL DATA:  Fever of unknown origin. History of lung cancer with ongoing radiation therapy. EXAM: CT CHEST WITH CONTRAST TECHNIQUE: Multidetector CT imaging of the chest was performed during intravenous contrast administration. CONTRAST:  61mL OMNIPAQUE IOHEXOL 350 MG/ML SOLN COMPARISON:  04/09/2021 chest CT FINDINGS: Cardiovascular: Left anterior descending coronary artery atherosclerosis along  with mild thoracic aortic atherosclerotic calcification. Mediastinum/Nodes: Stable small scattered mediastinal lymph nodes. Lungs/Pleura: There has been removal of the pigtail catheter from the large complex process involving the right lung apex with extension into the right middle lobe and possibly the right lower lobe. Prior right upper lobectomy. The complex right apical process measures about 11.7 by 8.3 by 11.1 cm (volume = 560 cm^3) and contains mostly complex material with some scattered mixed and gas, and with a relatively stable appearing cavitary component in the upper portion of the left lower lobe. When I measure this process in the same fashion on the 04/09/2021 exam I arrive at a size of 11.8 by 8.3 by 12.1 cm (volume = 620 cm^3), and is this process is minimally reduced in volume compared to 04/09/2021. Small amount of airspace opacity anteriorly in the right middle lobe on image 55 of series 4 previously had ground-glass opacity and has currently mildly increased in density. Secondary pulmonary lobular interstitial accentuation at the right lung base with scattered ground-glass opacities. A small right pleural effusion is observed, and medially along this pleural effusion on image 87 series 2 there is a 2.5 by 2.5 cm nodule or complex lesion which previously measured about 2.0 by 1.6 cm. I cannot exclude a tumor deposit in this vicinity. Volume loss and ground-glass opacity along the margins of the extension of the complex right apical fluid collection into the right lower lobe noted. This is similar to prior. Slight improvement in the scattered ground-glass opacities in the left lung compared to previous. Small clustered nodules in the left upper lobe on images 71-78 of series 4 measuring up to about 0.3 cm in diameter, similar to prior. Right subpleural nodule 0.7 cm in diameter on image 108 series 4, previously 0.5 cm. Right subpleural nodule 0.9 by 0.8 cm on image 110 series 4, previously 0.7 by  0.5 cm. A peripheral right lower lobe nodule measures up to 0.6 cm in diameter on image 114 series 4, previously 0.4 cm. Accordingly some of the left basilar nodules are enlarging. There is a new 4 mm left lower lobe subpleural nodule on image  98 series 4. Upper Abdomen: Fullness of the right adrenal gland is incompletely characterized. Musculoskeletal: Stable right fifth rib fracture. Screw tracks in the right fifth and sixth ribs. IMPRESSION: 1. Right upper lobectomy with complex right apical collection primarily with mixed density measuring 560 cubic cm, slightly reduced from prior measurement of 620 cubic cm on 04/09/2021. This continues to bulge down into the right middle lobe and right lower lobe with a right lower lobe superior segmental cavitary component as on previous exams. The pigtail catheter has been removed from this collection. 2. Waxing and waning appearance of other findings, including mostly improved scattered ground-glass densities in the left lung although with some increase in nodularity in the left lower lobe which is nonspecific and could be from atypical infection (less likely malignant spread). Similar degree of secondary pulmonary lobular interstitial accentuation at the right lung base with scattered ground-glass opacities. 3. Increase in size of a 2.5 cm in diameter complex region medially along the left pleural effusion. I cannot exclude pleural tumor. This previously measured 2.0 by 1.6 cm. Surveillance will likely be warranted. 4. Given the widespread scattered complex pulmonary opacities and especially the findings in the right lung, superimposed infection is not excluded as a potential cause for fever. 5. Stable subacute right fifth rib fracture with screw tracks in the right fifth and sixth ribs. Electronically Signed   By: Gaylyn Rong M.D.   On: 04/17/2021 07:38   DG Chest Port 1 View  Result Date: 04/17/2021 CLINICAL DATA:  Shortness of breath with fevers EXAM: PORTABLE  CHEST 1 VIEW COMPARISON:  04/10/2021 FINDINGS: Cardiac shadow is stable. Left lung is clear. Right lung again demonstrates soft tissue density in the apex stable in appearance from the prior exam. Postsurgical changes are noted in the right apex. No new focal abnormality is noted. IMPRESSION: Stable appearance of the chest when compare with the prior exam. Electronically Signed   By: Alcide Clever M.D.   On: 04/17/2021 03:09    EKG: Independently reviewed. Sinus tach, PVCs, no st elevations  Assessment/Plan Febrile illness SIRS     - admit to tele, inpt     - continue broad spec abx     - CTA PE as above     - PRN nebs     - PCCM consulted, appreciate assistance     - wean O2 as able  Adenocarcinoma of the lung     - continue outpt follow up  Anxiety/Depression     - continue home regimen  Normocytic anemia/anemia of chronic disease     - at baseline, follow  Chronic pain     - continue home regimen  HTN     - normotensive, continue home regimen  DM2     - SSI, DM diet, glucose checks  GERD     - protonix  DVT prophylaxis: SCDs  Code Status: DNR  Family Communication: w/ wife at bedside  Consults called: PCCM   Status is: Inpatient  Remains inpatient appropriate because:Inpatient level of care appropriate due to severity of illness  Dispo: The patient is from: Home              Anticipated d/c is to: Home              Patient currently is not medically stable to d/c.   Difficult to place patient No  Teddy Spike DO [facility]  If 7PM-7AM, please contact night-coverage [email or web address]  04/17/2021, 8:18 AM

## 2021-04-17 NOTE — Sepsis Progress Note (Signed)
[facility] following for Sepsis Protocol 

## 2021-04-17 NOTE — Consult Note (Addendum)
NAME:  Christopher Carter, MRN:  [id number], DOB:  [birth year], LOS: 0 ADMISSION DATE:  04/17/2021, CONSULTATION DATE:  04/17/21 REFERRING MD:  Dr. Marylyn Ishihara, CHIEF COMPLAINT: SOB, Weakness, Fever    History of Present Illness:  60 y/o M, never smoker, who presented to [facility] on 8/9 with reports of fever.    The patient was referred to Dr. Julien Nordmann in April 2022 with he was found in March to have a large right apical opacity on imaging after ongoing cough from COVID.  At that time the CT showed he had a RUL mass that was 7.5 x 6.4. 6.4 cm abutting the pleural surface without definite chest wall invasion. PET scan 12/13/20 confirmed markedly hypermetabolic RUL mass consistent with primary bronchogenic neoplasm.  MRI Brain was negative for metastatic disease. On 01/05/21 he underwent FOB with EBUS with pathology consistent with non-small cell lung cancer.  He was referred to CVTS in early May 2022 & was admitted for VATS with RUL lobectomy 01/22/2021 with positive margin posteriorly near the spine.  Post operative course was complicated by significant pain in chest wall & back.  Additionally, he had a fluid collection in the RUL.  He was admitted from 6/19-6/29 with hemoptysis and pain.  He was treated for possible pulmonary infection.  He returned to the hospital from 7/10-04/10/21 with ongoing hypoxic respiratory failure, uncontrolled pain, & tooth abscess.  During that admission he had a repeat FOB on 7/11 which showed no active bleeding.  He had CT imaging which showed new gas in the the known complex right apical fluid collection and this was drained via chest tube per IR (chest tube removed 04/09/21). He started radiotherapy for the bronchial and pleural margins and is expected to complete this 05/07/2021.  He returns to the ER at [facility] on 8/9 with reports of shortness of breath, weakness and fever. He was reportedly doing well after his most recent discharge but woke the am of 8/9 with drenching sweats, mild confusion and  difficulty concentrating. He reported ongoing low back pain since admit.  A CT of the chest with contrast was assessed which showed a RUL lobectomy with known complex right apical collection with mixed density that was smaller in size, waxing and waning appearance of other findings to include scattered ground glass opacities in the left lung with some increase in nodularity in the LLL, increase in size of 2.5 cm in  diameter complex region medially along the the left pleural effusion, stable subacute right fifth rib fracture with screw tracks in the right 5th/6th ribs. He was admitted per [facility] for further evaluation of febrile illness.  Blood cultures were obtained. COVID / flu testing negative. Empiric antibiotics were initiated - cefepime + vancomycin.  PCCM consulted for evaluation of pulmonary findings on CT.    Pertinent  Medical History  Poorly Differentiated Adenocarcinoma of the Lung HTN  Anxiety / Depression   COVID Infection 11/2020  Significant Hospital Events: Including procedures, antibiotic start and stop dates in addition to other pertinent events   8/9 Admit with fever, weakness, SOB   Interim History / Subjective:  As above   Objective   Blood pressure 140/89, pulse (!) 108, temperature 98.9 F (37.2 C), temperature source Oral, resp. rate 10, height $RemoveBe'5\' 10"'LipQhLpKe$  (1.778 m), weight 80.7 kg, SpO2 97 %.        Intake/Output Summary (Last 24 hours) at 04/17/2021 1045 Last data filed at 04/17/2021 0625 Gross per 24 hour  Intake 2102.88 ml  Output --  Net 2102.88 ml   Filed Weights   04/17/21 0241  Weight: 80.7 kg    Examination: General: chronically ill appearing adult male lying in bed in NAD HENT: MM pink/moist, pupils 93mm, anicteric  Lungs: non-labored at rest, diminished on right, clear on left  Cardiovascular: s1s2 rrr, no m/r/g Abdomen: soft, non-tender, BSx4 active  Extremities: warm/dry, no edema. Small areas of erythema on arm, back and chest - pt reports from  tape/IV's  Neuro: Awake, alert. Able to participate in conversation / oriented.    Resolved Hospital Problem list     Assessment & Plan:   Sepsis with Fever, Weakness Suspect pulmonary source, possible aspiration vs infection not targeted by Augmentin (was on at home)>  He still has broken tooth on right lower that was previously planned to be pulled but has not been yet (was to get a root canal but tooth was fractured > instead needs to be pulled) -admit to SDU per [facility]  -follow blood cultures -empiric broad spectrum antibiotics - vancomycin, cefepime  -mobilize, PT efforts -tylenol for fever / comfort -sputum culture if able  -follow CBC  -consider barium swallow given active radiation to chest once more comfortable -will need follow up with dentist at discharge   Poorly Differentiated Adenocarcinoma of Lung  RUL Fluid Collection  Currently undergoing radiation therapy, planned through 8/29.  -will need outpatient follow up at discharge with radiation oncology & Dr. Julien Nordmann -no acute interventions needed for RUL fluid collection   Chronic Pain Low Back Pain   -plan per Dr. Hilma Favors  -pt describes getting out of his recliner in an unusual way (keeps foot up and swings legs wide to get up), ? SI joint pain   Best Practice (right click and "Reselect all SmartList Selections" daily)  Diet/type: Regular consistency (see orders) DVT prophylaxis: SCD GI prophylaxis: N/A Lines: N/A Foley:  N/A Code Status:  DNR Last date of multidisciplinary goals of care discussion - interdisciplinary discussion with Dr. Ander Slade, Dr. Hilma Favors, NP, patient and wife at bedside.  Reviewed goals of care, disease trajectory and plan of care.  DNR established but continue all medical care otherwise.    Labs   CBC: Recent Labs  Lab 04/17/21 0323  WBC 7.7  NEUTROABS 6.4  HGB 8.9*  HCT 28.4*  MCV 96.3  PLT 665    Basic Metabolic Panel: Recent Labs  Lab 04/17/21 0323  NA 138  K 4.3  CL 98   CO2 30  GLUCOSE 158*  BUN 9  CREATININE 0.65  CALCIUM 9.1   GFR: Estimated Creatinine Clearance: 103.9 mL/min (by C-G formula based on SCr of 0.65 mg/dL). Recent Labs  Lab 04/17/21 0323  WBC 7.7  LATICACIDVEN 0.7    Liver Function Tests: Recent Labs  Lab 04/17/21 0323  AST 10*  ALT 12  ALKPHOS 71  BILITOT 0.7  PROT 6.2*  ALBUMIN 3.1*   No results for input(s): LIPASE, AMYLASE in the last 168 hours. No results for input(s): AMMONIA in the last 168 hours.  ABG    Component Value Date/Time   PHART 7.458 (H) 02/26/2021 0630   PCO2ART 40.5 02/26/2021 0630   PO2ART 79 (L) 02/26/2021 0630   HCO3 28.7 (H) 02/26/2021 0630   TCO2 30 02/26/2021 0630   ACIDBASEDEF 1.3 01/18/2021 1504   O2SAT 96.0 02/26/2021 0630     Coagulation Profile: Recent Labs  Lab 04/17/21 0323  INR 1.0    Cardiac Enzymes: No results for input(s): CKTOTAL, CKMB, CKMBINDEX, TROPONINI in  the last 168 hours.  HbA1C: Hgb A1c MFr Bld  Date/Time Value Ref Range Status  03/23/2021 05:32 AM 6.5 (H) 4.8 - 5.6 % Final    Comment:    (NOTE) Pre diabetes:          5.7%-6.4%  Diabetes:              >6.4%  Glycemic control for   <7.0% adults with diabetes     CBG: No results for input(s): GLUCAP in the last 168 hours.  Review of Systems:   Gen: Denies fever, chills, weight change, fatigue, night sweats HEENT: Denies blurred vision, double vision, hearing loss, tinnitus, sinus congestion, rhinorrhea, sore throat, neck stiffness, dysphagia PULM: Denies shortness of breath, cough, sputum production, hemoptysis, wheezing CV: Denies chest pain, edema, orthopnea, paroxysmal nocturnal dyspnea, palpitations GI: Denies abdominal pain, nausea, vomiting, diarrhea, hematochezia, melena, constipation, change in bowel habits GU: Denies dysuria, hematuria, polyuria, oliguria, urethral discharge Endocrine: Denies hot or cold intolerance, polyuria, polyphagia or appetite change Derm: Denies rash, dry skin,  scaling or peeling skin change Heme: Denies easy bruising, bleeding, bleeding gums Neuro: Denies headache, numbness, weakness, slurred speech, loss of memory or consciousness MSK: Back pain   Past Medical History:  He,  has a past medical history of adenoca of lung (12/2020), Anxiety, Depression, Hypertension, and Pneumonia.   Surgical History:   Past Surgical History:  Procedure Laterality Date   ANKLE FRACTURE SURGERY Right    BRONCHIAL BIOPSY  01/05/2021   Procedure: BRONCHIAL BIOPSIES;  Surgeon: Collene Gobble, MD;  Location: [place];  Service: Cardiopulmonary;;   BRONCHIAL BRUSHINGS  01/05/2021   Procedure: BRONCHIAL BRUSHINGS;  Surgeon: Collene Gobble, MD;  Location: [place];  Service: Cardiopulmonary;;   BRONCHIAL NEEDLE ASPIRATION BIOPSY  01/05/2021   Procedure: BRONCHIAL NEEDLE ASPIRATION BIOPSIES;  Surgeon: Collene Gobble, MD;  Location: [place];  Service: Cardiopulmonary;;   COLON SURGERY  2017   bowel obstruction surgery per pt    ENDOBRONCHIAL ULTRASOUND N/A 01/05/2021   Procedure: ENDOBRONCHIAL ULTRASOUND;  Surgeon: Collene Gobble, MD;  Location: [place];  Service: Cardiopulmonary;  Laterality: N/A;   INTERCOSTAL NERVE BLOCK Right 01/22/2021   Procedure: INTERCOSTAL NERVE BLOCK;  Surgeon: [name], MD;  Location: [place];  Service: Thoracic;  Laterality: Right;   LAPAROSCOPY N/A 04/25/2017   Procedure: LAPAROSCOPY ASSISTED ILEO-CECECTOMY WITH SMALL BOWEL RESECTION WITH ANASTOMOSIS;  Surgeon: Excell Seltzer, MD;  Location: [facility];  Service: General;  Laterality: N/A;   LYMPH NODE DISSECTION Right 01/22/2021   Procedure: LYMPH NODE DISSECTION;  Surgeon: [name], MD;  Location: [place];  Service: Thoracic;  Laterality: Right;   THORACOTOMY Right 01/22/2021   Procedure: THORACOTOMY;  Surgeon: [name], MD;  Location: [place];  Service: Thoracic;  Laterality: Right;   VIDEO ASSISTED THORACOSCOPY (VATS)/ LOBECTOMY Right  01/22/2021   Procedure: VIDEO ASSISTED THORACOSCOPY (VATS)/RIGHT UPPER LOBECTOMY;  Surgeon: [name], MD;  Location: [place];  Service: Thoracic;  Laterality: Right;   VIDEO BRONCHOSCOPY N/A 01/05/2021   Procedure: VIDEO BRONCHOSCOPY WITH FLUORO;  Surgeon: Collene Gobble, MD;  Location: [place];  Service: Cardiopulmonary;  Laterality: N/A;   VIDEO BRONCHOSCOPY N/A 03/19/2021   Procedure: VIDEO BRONCHOSCOPY WITHOUT FLUORO;  Surgeon: [name], DO;  Location: [place] [facility];  Service: Endoscopy;  Laterality: N/A;  need cryo probe available     Social History:   reports that he has never smoked. He has never used smokeless tobacco. He  reports current alcohol use of about 22.0 standard drinks of alcohol per week. He reports that he does not use drugs.   Family History:  His family history is not on file.   Allergies Allergies  Allergen Reactions   Oxycodone Hcl Other (See Comments)    Pt reported "seeing things" and " feeling unusual."   Bupropion Nausea And Vomiting     Home Medications  Prior to Admission medications   Medication Sig Start Date End Date Taking? Authorizing Provider  acetaminophen (TYLENOL) 500 MG tablet Take 500 mg by mouth every 8 (eight) hours as needed for moderate pain or headache.   Yes [provider]  amoxicillin-clavulanate (AUGMENTIN) 875-125 MG tablet Take 1 tablet by mouth every 12 (twelve) hours for 14 days. 04/10/21 04/24/21 Yes Donne Hazel, MD  Ascorbic Acid (VITAMIN C) 1000 MG tablet Take 1,000 mg by mouth daily.   Yes [provider]  benzonatate (TESSALON) 200 MG capsule Take 1 capsule (200 mg total) by mouth 2 (two) times daily. Patient taking differently: Take 200 mg by mouth 2 (two) times daily as needed for cough. 04/08/21  Yes Acquanetta Chain, DO  celecoxib (CELEBREX) 200 MG capsule Take 1 capsule (200 mg total) by mouth 2 (two) times daily. 04/08/21  Yes Lane Hacker L, DO  diazepam (VALIUM) 10 MG tablet  Take 0.5-1 tablets (5-10 mg total) by mouth daily as needed for anxiety (30 minutes PRIOR to Radiation). 04/08/21  Yes Acquanetta Chain, DO  diazepam (VALIUM) 2 MG tablet Take 1 tablet (2 mg total) by mouth every 12 (twelve) hours. 04/11/21  Yes Acquanetta Chain, DO  fentaNYL (DURAGESIC) 100 MCG/HR Place 1 patch onto the skin every 3 (three) days. 04/12/21 05/12/21 Yes Acquanetta Chain, DO  fentaNYL (DURAGESIC) 25 MCG/HR This is in addition to the 100 mcg patch for total of 125 mcg q72 hours. 04/11/21  Yes Lane Hacker L, DO  ferrous sulfate 325 (65 FE) MG tablet Take 1 tablet (325 mg total) by mouth daily with breakfast. 04/11/21 05/11/21 Yes Donne Hazel, MD  gabapentin (NEURONTIN) 300 MG capsule Take 300-600 mg by mouth See admin instructions. Takes 300 mg in the morning $RemoveBef'300mg'rMCzttEmca$  in the afternoon and 600 mg at night 03/12/21  Yes [provider]  ipratropium-albuterol (DUONEB) 0.5-2.5 (3) MG/3ML SOLN Take 3 mLs by nebulization every 4 (four) hours as needed. Patient taking differently: Take 3 mLs by nebulization in the morning and at bedtime. 04/10/21  Yes Lane Hacker L, DO  latanoprost (XALATAN) 0.005 % ophthalmic solution Place 1 drop into both eyes at bedtime.   Yes [provider]  lidocaine (LIDODERM) 5 % Place 1 patch onto the skin daily. Remove & Discard patch within 12 hours or as directed by MD 03/07/21  Yes Regalado, Belkys A, MD  morphine (MSIR) 30 MG tablet Take 1 tablet (30 mg total) by mouth every 4 (four) hours as needed for severe pain. 04/12/21  Yes Acquanetta Chain, DO  polyethylene glycol (MIRALAX / GLYCOLAX) 17 g packet Take 17 g by mouth daily as needed for mild constipation. 03/07/21  Yes Regalado, Belkys A, MD  sertraline (ZOLOFT) 100 MG tablet Take 100 mg by mouth daily. 10/29/20  Yes [provider]  vitamin B-12 (CYANOCOBALAMIN) 1000 MCG tablet Take 1,000 mcg by mouth daily.   Yes [provider]  amphetamine-dextroamphetamine  (ADDERALL) 10 MG tablet Take 1 tablet (10 mg total) by mouth 2 (two) times daily. 04/11/21  Lane Hacker L, DO  blood glucose meter kit and supplies KIT Dispense based on patient and insurance preference. Use up to four times daily as directed. 04/10/21   Donne Hazel, MD  dexamethasone (DECADRON) 1 MG tablet Take 1 tablet (1 mg total) by mouth daily with breakfast. Patient not taking: No sig reported 04/16/21   Lane Hacker L, DO  glucose blood ([employer] GLUCOSE METER TEST STRIPS) test strip Use as instructed 04/12/21   Acquanetta Chain, DO  irbesartan (AVAPRO) 75 MG tablet Take 1 tablet (75 mg total) by mouth daily. Patient not taking: Reported on 04/17/2021 04/11/21 05/11/21  Donne Hazel, MD  pantoprazole (PROTONIX) 40 MG tablet Take 1 tablet (40 mg total) by mouth daily. Patient not taking: Reported on 04/17/2021 04/16/21   Acquanetta Chain, DO  QUEtiapine (SEROQUEL) 50 MG tablet Take 50 mg by mouth at bedtime. Take 50 mg for 3 nights then reduce to 25 mg for three nights, then 12.$RemoveBeforeD'5mg'kVPVvGufEKKmic$  every night for sleep Patient not taking: Reported on 04/17/2021 11/06/20   [provider]  furosemide (LASIX) 20 MG tablet Take 1 tablet (20 mg total) by mouth daily. Patient not taking: No sig reported 03/07/21 03/18/21  Elmarie Shiley, MD     Critical care time: n/a    Noe Gens, MSN, APRN, NP-C, AGACNP-BC [facility] 04/17/2021, 10:46 AM   Please see [email or web address] for pager details.   From 7A-7P if no response, please call [phone] After hours, please call ELink [phone]

## 2021-04-17 NOTE — ED Notes (Signed)
IP nurse requesting 63min until pt is brought to floor.

## 2021-04-17 NOTE — ED Notes (Signed)
Dr. Marylyn Ishihara at bedside.

## 2021-04-17 NOTE — ED Notes (Signed)
MD and NP at bedside

## 2021-04-17 NOTE — ED Notes (Signed)
Pt requesting pain medication for lower back pain 8/10. Hina, PA messaged.

## 2021-04-17 NOTE — ED Notes (Signed)
Patient transported to CT 

## 2021-04-17 NOTE — ED Notes (Signed)
Secure chat sent to IP nurse for handoff.

## 2021-04-18 ENCOUNTER — Ambulatory Visit
Admission: RE | Admit: 2021-04-18 | Discharge: 2021-04-18 | Disposition: A | Discharge status: Home / Self Care | Payer: 59 | Source: Ambulatory Visit | Attending: Radiation Oncology | Admitting: Radiation Oncology

## 2021-04-18 ENCOUNTER — Inpatient Hospital Stay (HOSPITAL_COMMUNITY): Payer: 59

## 2021-04-18 DIAGNOSIS — J189 Pneumonia, unspecified organism: Secondary | ICD-10-CM | POA: Diagnosis not present

## 2021-04-18 DIAGNOSIS — G893 Neoplasm related pain (acute) (chronic): Secondary | ICD-10-CM | POA: Diagnosis not present

## 2021-04-18 LAB — CBC
HCT: 30.6 % — ABNORMAL LOW (ref 39.0–52.0)
Hemoglobin: 9.2 g/dL — ABNORMAL LOW (ref 13.0–17.0)
MCH: 29.1 pg (ref 26.0–34.0)
MCHC: 30.1 g/dL (ref 30.0–36.0)
MCV: 96.8 fL (ref 80.0–100.0)
Platelets: 203 10*3/uL (ref 150–400)
RBC: 3.16 MIL/uL — ABNORMAL LOW (ref 4.22–5.81)
RDW: 16.6 % — ABNORMAL HIGH (ref 11.5–15.5)
WBC: 6.9 10*3/uL (ref 4.0–10.5)
nRBC: 0 % (ref 0.0–0.2)

## 2021-04-18 LAB — COMPREHENSIVE METABOLIC PANEL
ALT: 11 U/L (ref 0–44)
AST: 11 U/L — ABNORMAL LOW (ref 15–41)
Albumin: 2.8 g/dL — ABNORMAL LOW (ref 3.5–5.0)
Alkaline Phosphatase: 71 U/L (ref 38–126)
Anion gap: 9 (ref 5–15)
BUN: 11 mg/dL (ref 6–20)
CO2: 28 mmol/L (ref 22–32)
Calcium: 8.8 mg/dL — ABNORMAL LOW (ref 8.9–10.3)
Chloride: 102 mmol/L (ref 98–111)
Creatinine, Ser: 0.48 mg/dL — ABNORMAL LOW (ref 0.61–1.24)
GFR, Estimated: >60 mL/min (ref >60)
Glucose, Bld: 110 mg/dL — ABNORMAL HIGH (ref 70–99)
Potassium: 4.3 mmol/L (ref 3.5–5.1)
Sodium: 139 mmol/L (ref 135–145)
Total Bilirubin: 0.7 mg/dL (ref 0.3–1.2)
Total Protein: 5.9 g/dL — ABNORMAL LOW (ref 6.5–8.1)

## 2021-04-18 LAB — GLUCOSE, CAPILLARY
Glucose-Capillary: 134 mg/dL — ABNORMAL HIGH (ref 70–99)
Glucose-Capillary: 133 mg/dL — ABNORMAL HIGH (ref 70–99)
Glucose-Capillary: 180 mg/dL — ABNORMAL HIGH (ref 70–99)
Glucose-Capillary: 198 mg/dL — ABNORMAL HIGH (ref 70–99)

## 2021-04-18 LAB — PROTIME-INR
INR: 1.1 (ref 0.8–1.2)
Prothrombin Time: 14.4 seconds (ref 11.4–15.2)

## 2021-04-18 LAB — CORTISOL-AM, BLOOD: Cortisol - AM: 14.8 ug/dL (ref 6.7–22.6)

## 2021-04-18 LAB — PROCALCITONIN: Procalcitonin: <0.1 ng/mL

## 2021-04-18 IMAGING — MR MR LUMBAR SPINE WO/W CM
4 of 7 series · 26 of 48 positions shown · IV contrast (8ML GADAVIST)
Comparison: CT from [DATE].

CLINICAL DATA: Initial evaluation for cord compression, severe
lower back pain, history of lung cancer.

EXAM:
MRI LUMBAR SPINE WITHOUT AND WITH CONTRAST
TECHNIQUE: Multiplanar and multiecho pulse sequences of the lumbar spine were
obtained without and with intravenous contrast.
CONTRAST:  8mL GADAVIST GADOBUTROL 1 MMOL/ML IV SOLN

[Series 5: T1 · sagittal · 4.0mm · 0.81mm/px · 3 of 15 slices shown (1 of 2)]
[im 1/15]
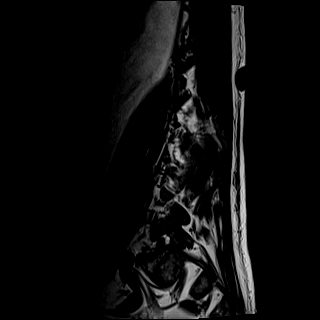
[im 8/15]
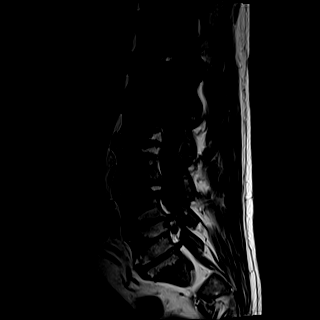
[im 15/15]
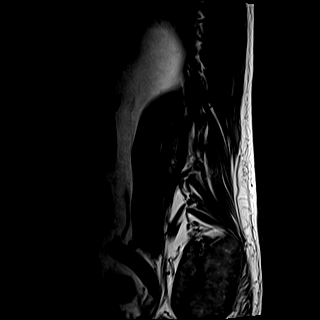

[Series 8: T2 · axial · 4.0mm · 0.62mm/px · z∈[-128,+98]mm · 11 of 43 slices shown]
[im 1/43]
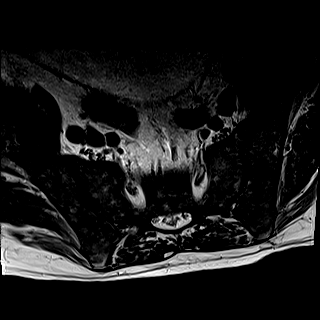
[im 5/43]
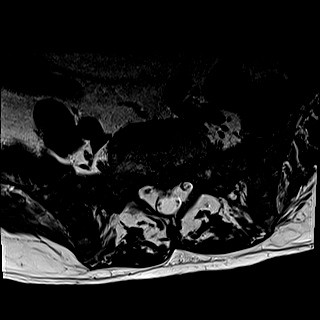
[im 9/43]
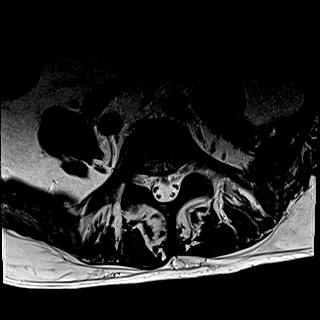
[im 13/43]
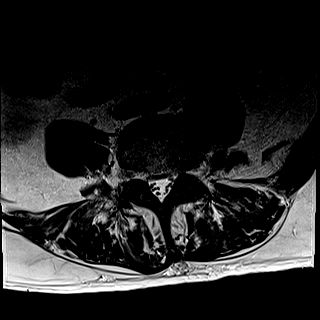
[im 17/43]
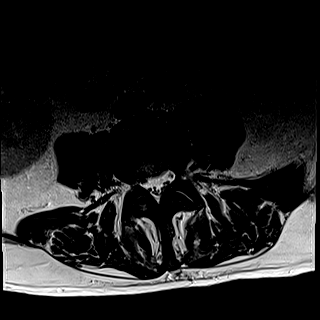
[im 22/43]
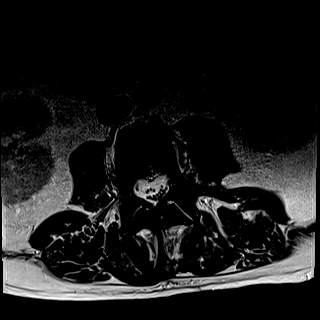
[im 26/43]
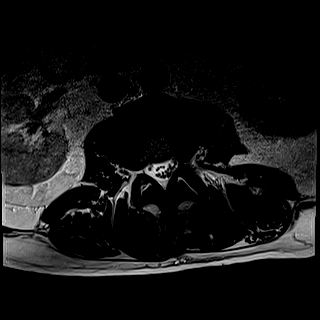
[im 30/43]
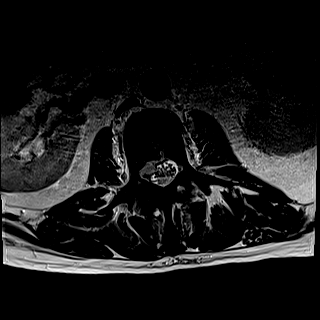
[im 34/43]
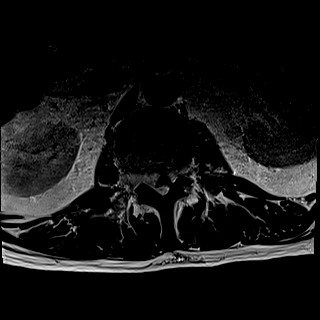
[im 38/43]
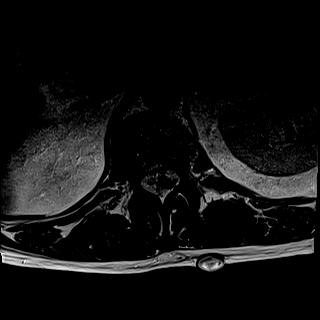
[im 43/43]
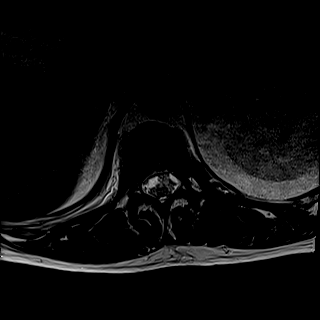

[Series 9: T1 · axial · 4.0mm · 0.39mm/px · z∈[-128,+73]mm · 8 of 43 slices shown (2 of 2)]
[im 1/43]
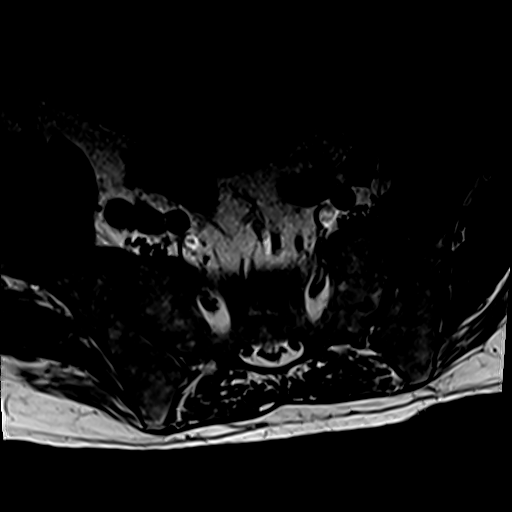
[im 5/43]
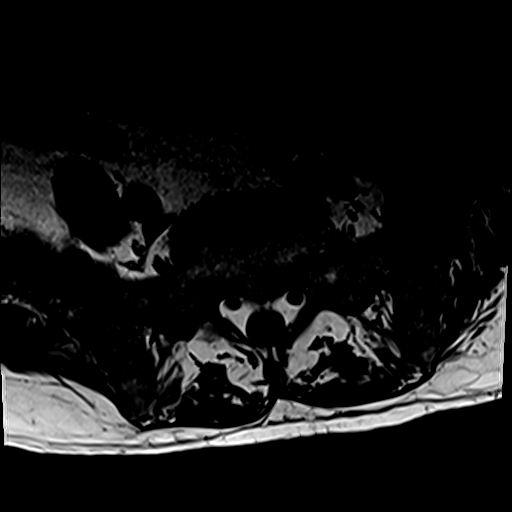
[im 9/43]
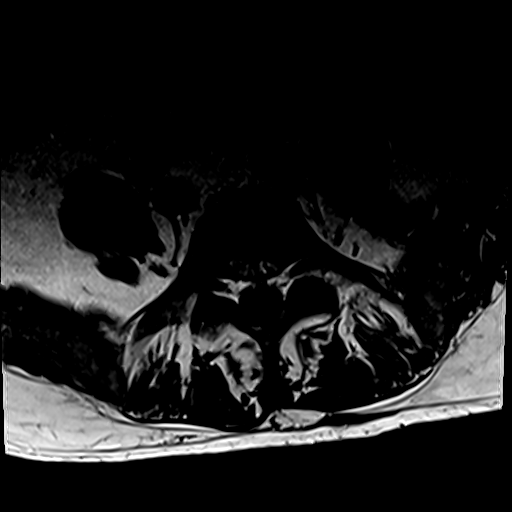
[im 13/43]
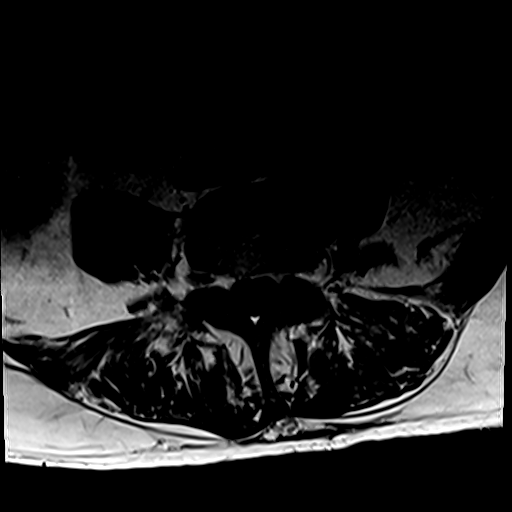
[im 17/43]
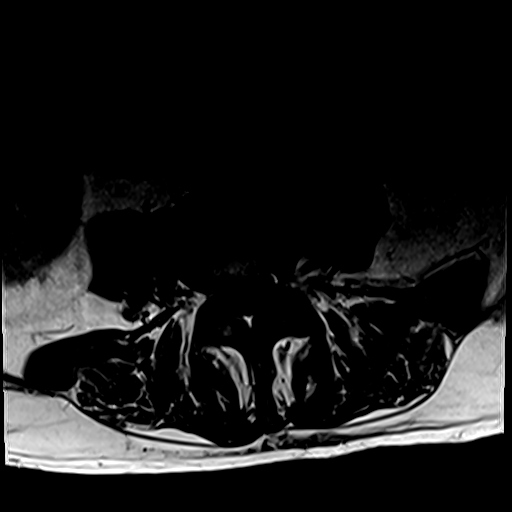
[im 22/43]
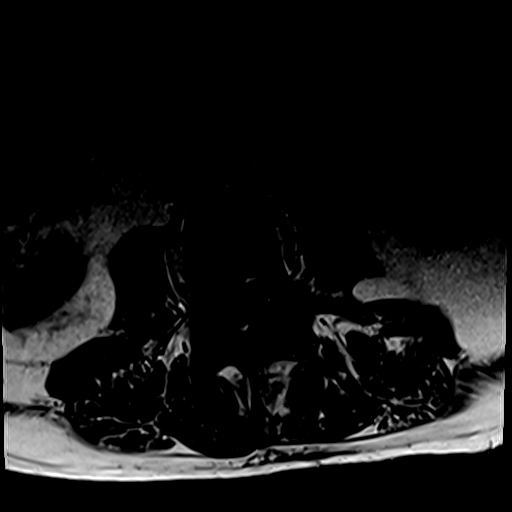
[im 26/43]
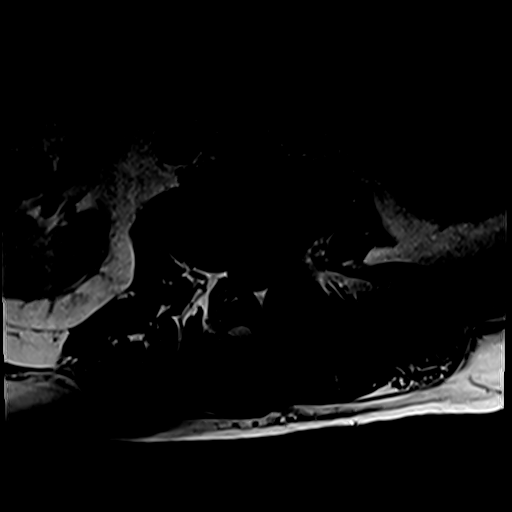
[im 38/43]
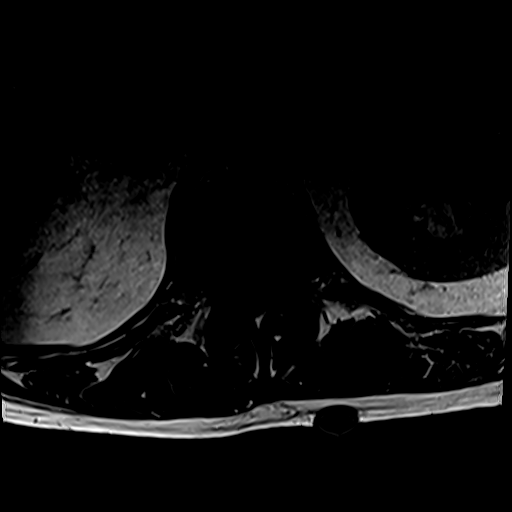

[Series 16: T2 post-contrast · sagittal · 4.0mm · 0.81mm/px · 4 of 15 slices shown]
[im 1/15]
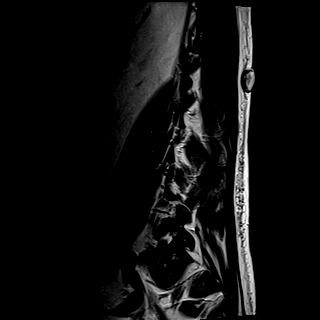
[im 5/15]
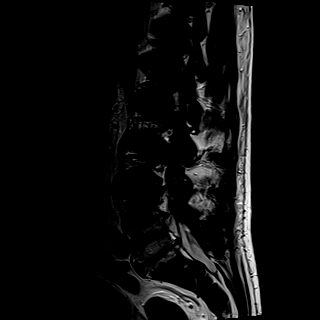
[im 10/15]
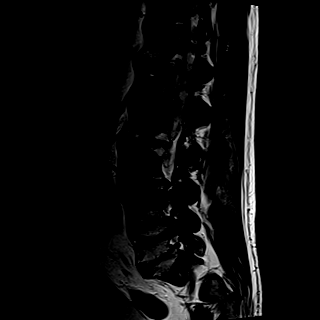
[im 15/15]
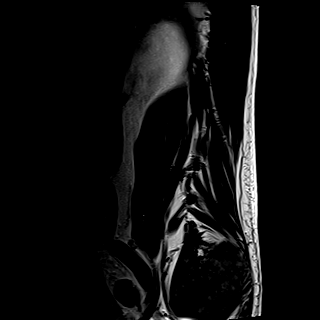

[26 of 48 positions shown; findings below may reference images not displayed]

FINDINGS: Segmentation: Standard. Lowest well-formed disc space labeled the
L5-S1 level.

Alignment: Trace retrolisthesis of L1 on L2 and L2 on L3. Alignment
otherwise normal with preservation of the normal lumbar lordosis.

Vertebrae: Abnormal STIR signal intensity with enhancement seen
largely replacing the L1 vertebral body, consistent with osseous
metastatic disease. Extension into the right pedicle posteriorly
noted. There is associated epidural tumor within the adjacent spinal
canal, extending from approximately T12 through L2 (series 17, image
9). Tumor is most pronounced within the ventral and right aspect of
the epidural space, with greatest involvement at the level of L1
with there is secondary compression of the thecal sac and severe
spinal stenosis (series 8, image 9). Thecal sac narrowed to
approximately 5 mm in AP diameter at its most narrow point (series
8, image 11). Suspected subdural and/or intradural involvement with
probable tumor seen intimately associated with the distal conus and
proximal nerve roots of the cauda equina (series 8, image 14).
Lateral extension to involve the neural foramina on the right at
T12-L1, bilaterally, but greater on the right at L1-2, and on the
right at L2-3.

Elsewhere, heterogeneous signal intensity seen throughout the
visualized osseous structures. Possible additional metastatic
involvement at the posterior/superior aspect of the L2 vertebral
body (series 6, image 7). No other definite discrete osseous
metastases are seen. Vertebral body height maintained without
pathologic fracture.

Conus medullaris and cauda equina: Conus extends to the
approximately the T12-L1 level. Metastatic and epidural involvement
as above.

Paraspinal and other soft tissues: Mild diffuse edema and
enhancement seen within the posterior paraspinous musculature,
nonspecific, but could reflect muscular strain or possibly myositis.
No collections. 1.7 cm epidermal inclusion cyst/sebaceous cyst noted
at the left upper back. Prominent distension of the partially
visualized urinary bladder.

Disc levels:

T11-12: Seen only on sagittal projection. Disc bulge with disc
desiccation. No significant spinal stenosis. Foramina appear patent.

T12-L1: Disc bulge with disc desiccation. Circumferential epidural
tumor, greatest on the right. Associated flattening and compression
of the thecal sac with resultant moderate spinal stenosis. Tumor
extension into the right neural foramen. Left neural foramina
remains patent.

L1-2: Disc bulge with disc desiccation. Possible superimposed right
paracentral disc protrusion. Circumferential epidural tumor
compresses the thecal sac with resultant severe spinal stenosis.
Thecal sac measures 5 mm in AP diameter at its most narrow point.
Tumor extension into the right greater than left neural foramina
noted.

L2-3: Degenerative intervertebral disc space narrowing with diffuse
disc bulge, eccentric to the left. Bilateral facet hypertrophy. No
significant spinal stenosis. Mild left foraminal narrowing. Right
neural foramina remains patent.

L3-4: Disc bulge with disc desiccation. Reactive endplate spurring.
Mild bilateral facet hypertrophy. Resultant mild narrowing of the
left lateral recess. Central canal remains patent. Mild right L3
foraminal stenosis. Left neural foramina remains patent.

L4-5: Advanced degenerative intervertebral disc space narrowing with
diffuse disc bulge and reactive endplate spurring. Mild to moderate
bilateral facet hypertrophy. No more than mild spinal stenosis. Mild
bilateral L4 foraminal narrowing. No frank impingement.

L5-S1: Degenerative intervertebral disc space narrowing with disc
desiccation and reactive endplate change. Superimposed central disc
protrusion indents the ventral thecal sac. Mild facet hypertrophy.
No significant spinal stenosis. Mild bilateral L5 foraminal
narrowing.
IMPRESSION: 1. Metastatic disease involving the L1 vertebral body with
associated epidural tumor extending from approximately T12 through
L2-3. Resultant severe spinal stenosis, most pronounced at the level
of L1-2. Probable subdural and/or intradural involvement with tumor
seen intimately associated with the distal conus and proximal nerve
roots of the cauda equina. Extension into the right greater than
left neural foramina at T12-L1 through L2-3 as above.
2. Possible additional metastatic involvement of the
posterior/superior aspect of the L2 vertebral body. No other
discrete osseous metastatic disease. No associated pathologic
fracture.
3. Prominent distension of the partially visualized urinary bladder.
Clinical correlation for possible urinary retention recommended.
4. Diffuse edema and enhancement within the posterior paraspinous
musculature, nonspecific, but could reflect muscular strain or
possibly myositis. No discrete collections.
5. Underlying multilevel degenerative spondylosis as detailed above.

Current attempt is being made to contact the covering provider at
[DATE] p.m. on [DATE], currently awaiting a call back. Results
will be conveyed as soon as possible.

## 2021-04-18 IMAGING — MR MR SACRUM / SI JOINTS WO/W CM
8 series · 47 of 48 positions shown · non-contrast
Comparison: [DATE] CT scan

CLINICAL DATA: Low back pain and sacral pain

EXAM:
MRI SACRUM WITHOUT CONTRAST
TECHNIQUE: Multiplanar multi-sequence MR imaging of the sacrum was performed.
No intravenous contrast was administered.

[Series 2: T1 · axial · 4.0mm · 1.17mm/px · z∈[-272,-124]mm · 5 of 34 slices shown (1 of 2)]
[im 1/34]
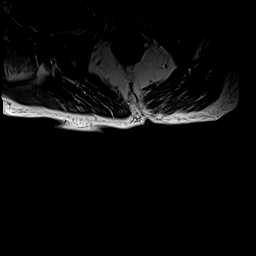
[im 9/34]
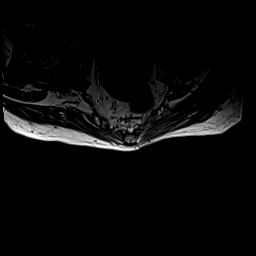
[im 17/34]
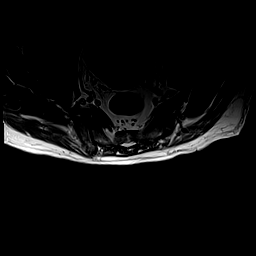
[im 25/34]
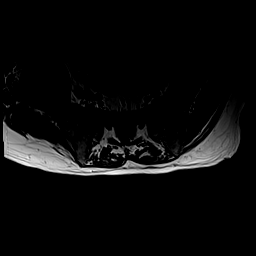
[im 34/34]
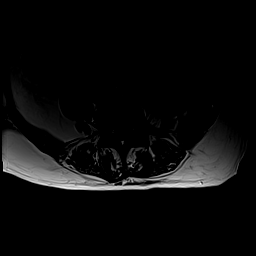

[Series 3: T2 fat-sat · axial · 4.0mm · 1.17mm/px · z∈[-272,-124]mm · 5 of 34 slices shown (1 of 2)]
[im 1/34]
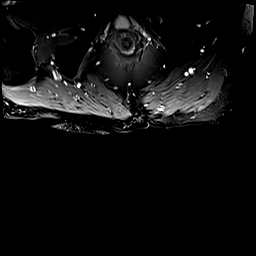
[im 9/34]
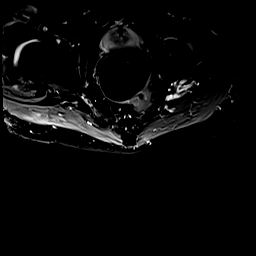
[im 17/34]
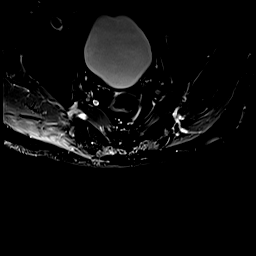
[im 25/34]
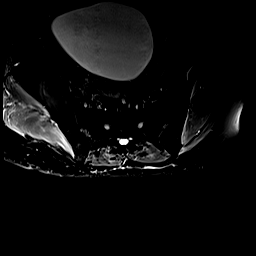
[im 34/34]
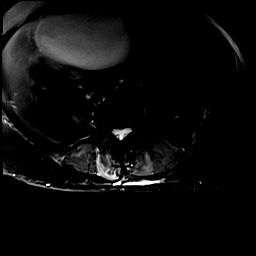

[Series 4: T1 · coronal · 3.0mm · 0.86mm/px · 7 of 38 slices shown (2 of 2)]
[im 1/38]
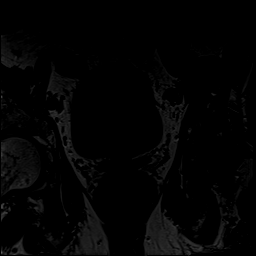
[im 7/38]
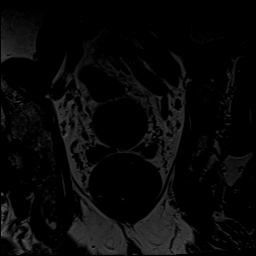
[im 13/38]
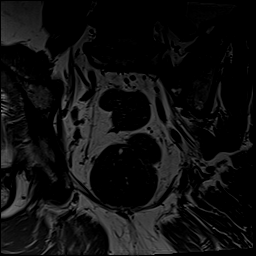
[im 19/38]
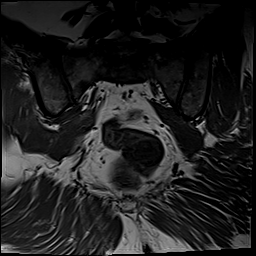
[im 25/38]
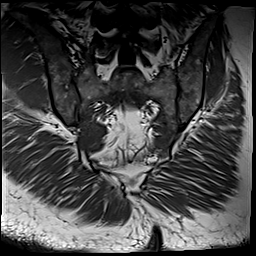
[im 31/38]
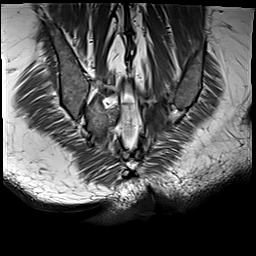
[im 38/38]
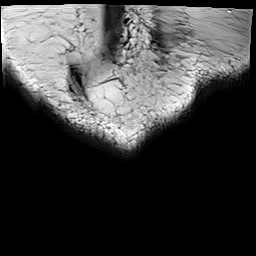

[Series 5: STIR · coronal · 3.0mm · 0.43mm/px · 7 of 38 slices shown]
[im 1/38]
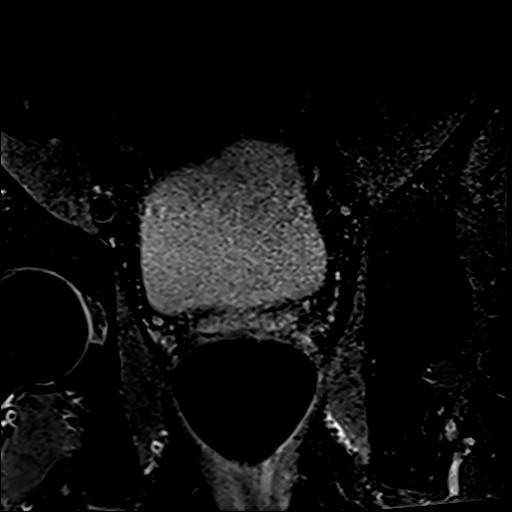
[im 7/38]
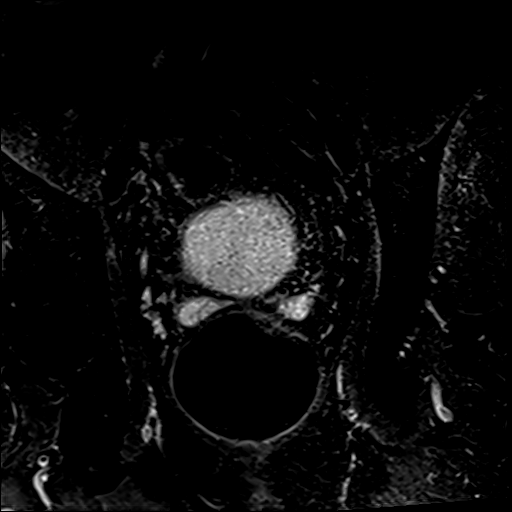
[im 13/38]
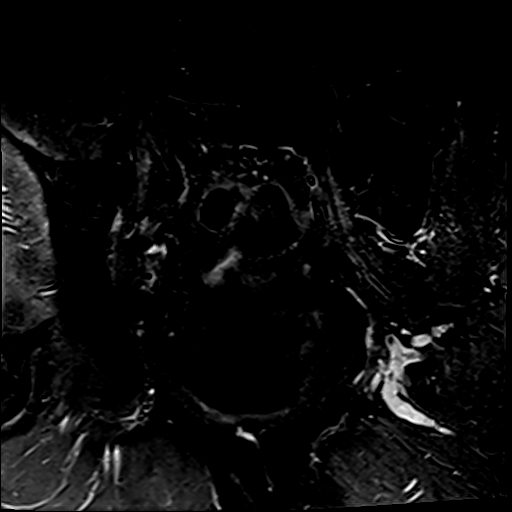
[im 19/38]
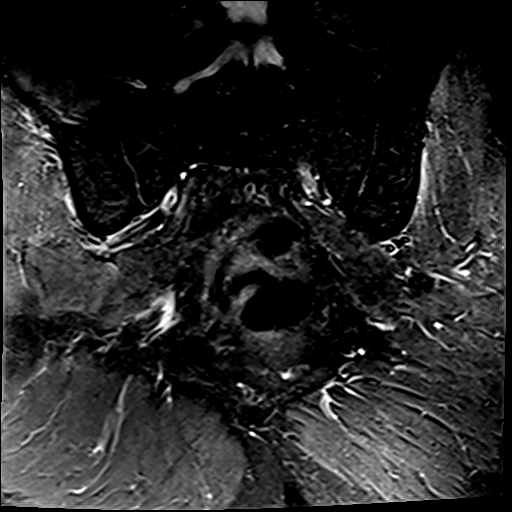
[im 25/38]
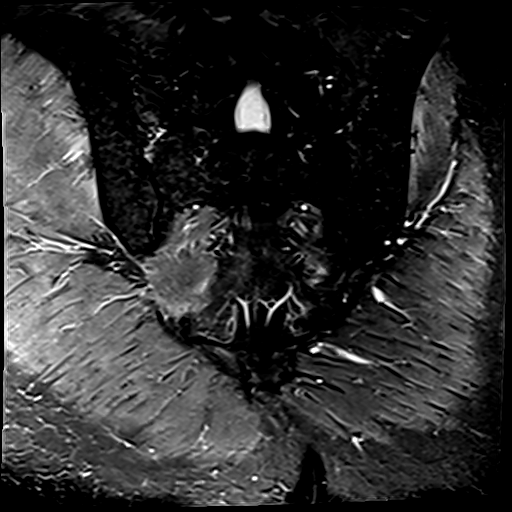
[im 31/38]
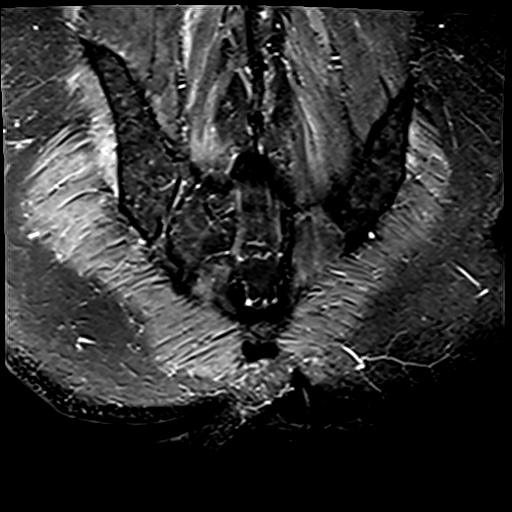
[im 38/38]
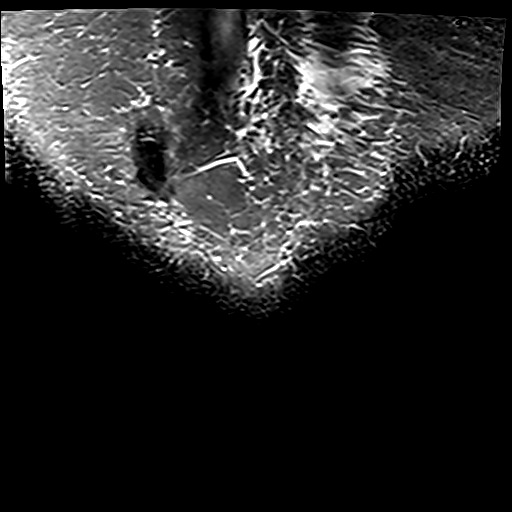

[Series 6: T2 fat-sat · sagittal · 4.0mm · 0.75mm/px · 4 of 30 slices shown (2 of 2)]
[im 1/30]
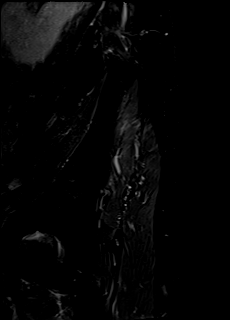
[im 8/30]
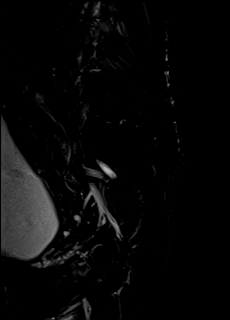
[im 15/30]
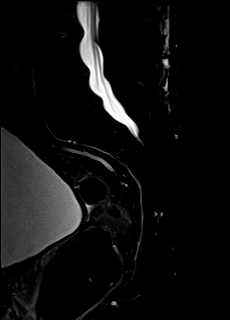
[im 22/30]
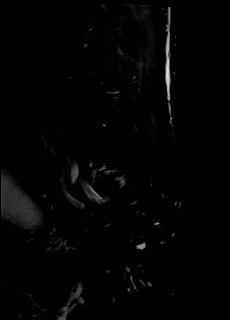

[Series 7: T1 fat-sat · axial · non-contrast · 4.0mm · 1.17mm/px · z∈[-272,-124]mm · 6 of 34 slices shown]
[im 1/34]
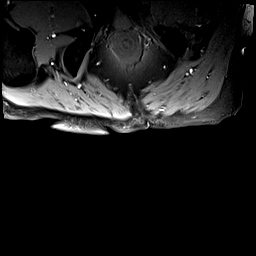
[im 7/34]
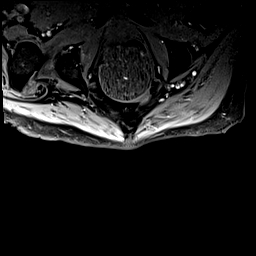
[im 14/34]
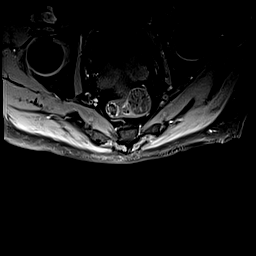
[im 20/34]
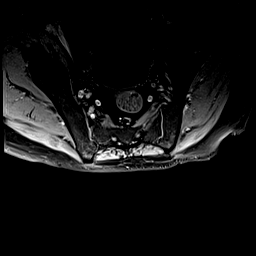
[im 27/34]
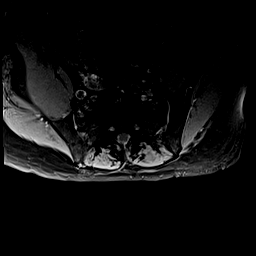
[im 34/34]
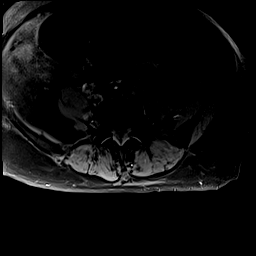

[Series 8: T1 fat-sat post-contrast · axial · 4.0mm · 1.17mm/px · z∈[-272,-124]mm · 6 of 34 slices shown (1 of 2)]
[im 1/34]
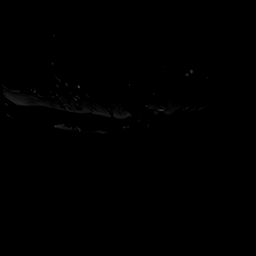
[im 7/34]
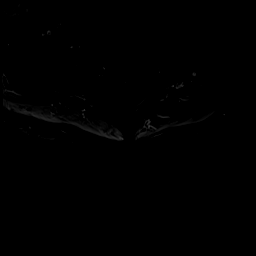
[im 14/34]
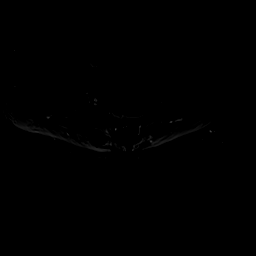
[im 20/34]
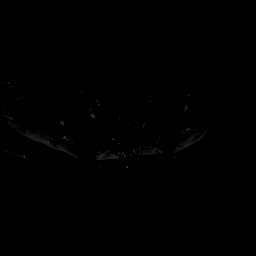
[im 27/34]
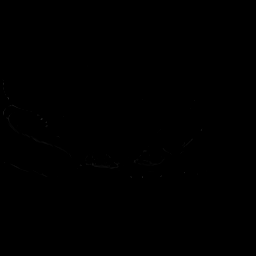
[im 34/34]
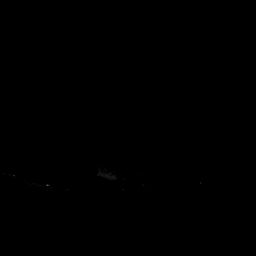

[Series 9: T1 fat-sat post-contrast · coronal · 3.0mm · 0.86mm/px · 7 of 38 slices shown (2 of 2)]
[im 1/38]
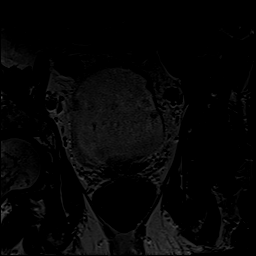
[im 7/38]
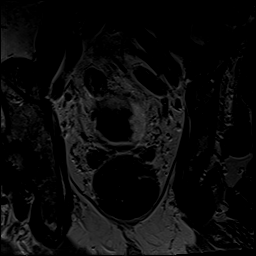
[im 13/38]
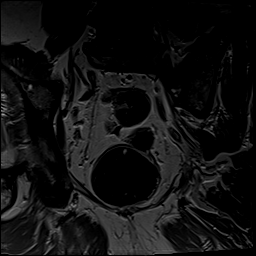
[im 19/38]
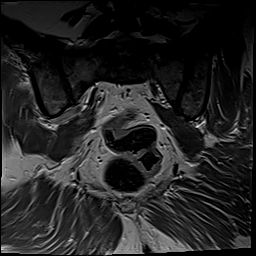
[im 25/38]
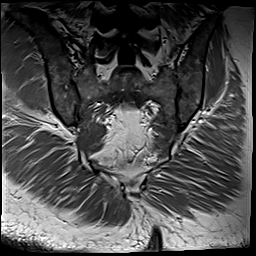
[im 31/38]
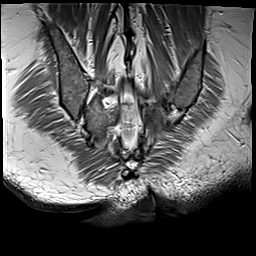
[im 38/38]
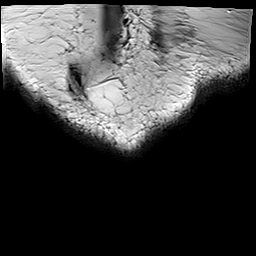

[47 of 48 positions shown; findings below may reference images not displayed]

FINDINGS: Osseous structures: The sacroiliac joints appear unremarkable. No
findings of metastatic disease to the sacrum. Red marrow
redistribution is present. This is nonspecific can be associated
with a variety of conditions including chronic anemia, chronic heart
failure, myelofibrosis, infiltrative marrow disease, response to
infection, and other conditions. There is evidence of lower lumbar
spondylosis and degenerative disc disease.

Musculotendinous: Unremarkable where included.

Sacral plexus and sciatic nerves: No impinging lesion, abnormal
edema, or abnormal enhancement along the sacral plexus or visualized
sciatic nerves.

Other: Urinary bladder mildly distended, significance uncertain. And
visualized bowel appear unremarkable.
IMPRESSION: 1. Lower lumbar spondylosis and degenerative disc disease, please
see dedicated lumbar spine MRI report.
2. Mild distention of the urinary bladder.

## 2021-04-18 MED ORDER — GADOBUTROL 1 MMOL/ML IV SOLN
8.0000 mL | Freq: Once | INTRAVENOUS | Status: AC | PRN
  Administered 2021-04-18: 8 mL via INTRAVENOUS

## 2021-04-18 MED ORDER — FENTANYL CITRATE (PF) 100 MCG/2ML IJ SOLN
50.0000 ug | INTRAMUSCULAR | Status: DC | PRN
  Administered 2021-04-18 – 2021-04-19 (×3): 100 ug via INTRAVENOUS
  Filled 2021-04-18 (×3): qty 2

## 2021-04-18 MED ORDER — DEXAMETHASONE SODIUM PHOSPHATE 10 MG/ML IJ SOLN
8.0000 mg | INTRAMUSCULAR | Status: DC
  Administered 2021-04-18: 8 mg via INTRAVENOUS
  Filled 2021-04-18: qty 1

## 2021-04-18 MED ORDER — LABETALOL HCL 5 MG/ML IV SOLN
5.0000 mg | Freq: Four times a day (QID) | INTRAVENOUS | Status: DC
  Administered 2021-04-18 – 2021-04-19 (×4): 5 mg via INTRAVENOUS
  Filled 2021-04-18 (×4): qty 4

## 2021-04-18 MED ORDER — ORAL CARE MOUTH RINSE
15.0000 mL | Freq: Two times a day (BID) | OROMUCOSAL | Status: DC
  Administered 2021-04-18 – 2021-04-25 (×10): 15 mL via OROMUCOSAL

## 2021-04-18 MED ORDER — DIAZEPAM 5 MG/ML IJ SOLN
10.0000 mg | Freq: Once | INTRAMUSCULAR | Status: AC
  Administered 2021-04-18: 10 mg via INTRAVENOUS
  Filled 2021-04-18: qty 2

## 2021-04-18 MED ORDER — FENTANYL CITRATE (PF) 100 MCG/2ML IJ SOLN
INTRAMUSCULAR | Status: AC
  Administered 2021-04-18: 100 ug via INTRAVENOUS
  Filled 2021-04-18: qty 2

## 2021-04-18 NOTE — Consult Note (Addendum)
NAME:  Christopher Carter, MRN:  [id number], DOB:  07-11-1961, LOS: 1 ADMISSION DATE:  04/17/2021, CONSULTATION DATE:  04/17/21 REFERRING MD:  Dr. Marylyn Ishihara, CHIEF COMPLAINT: SOB, Weakness, Fever    History of Present Illness:  60 y/o M, never smoker, who presented to [facility] on 8/9 with reports of fever.    The patient was referred to Dr. Julien Nordmann in April 2022 with he was found in March to have a large right apical opacity on imaging after ongoing cough from COVID.  At that time the CT showed he had a RUL mass that was 7.5 x 6.4. 6.4 cm abutting the pleural surface without definite chest wall invasion. PET scan 12/13/20 confirmed markedly hypermetabolic RUL mass consistent with primary bronchogenic neoplasm.  MRI Brain was negative for metastatic disease. On 01/05/21 he underwent FOB with EBUS with pathology consistent with non-small cell lung cancer.  He was referred to CVTS in early May 2022 & was admitted for VATS with RUL lobectomy 01/22/2021 with positive margin posteriorly near the spine.  Post operative course was complicated by significant pain in chest wall & back.  Additionally, he had a fluid collection in the RUL.  He was admitted from 6/19-6/29 with hemoptysis and pain.  He was treated for possible pulmonary infection.  He returned to the hospital from 7/10-04/10/21 with ongoing hypoxic respiratory failure, uncontrolled pain, & tooth abscess.  During that admission he had a repeat FOB on 7/11 which showed no active bleeding.  He had CT imaging which showed new gas in the the known complex right apical fluid collection and this was drained via chest tube per IR (chest tube removed 04/09/21). He started radiotherapy for the bronchial and pleural margins and is expected to complete this 05/07/2021.  He returns to the ER at [facility] on 8/9 with reports of shortness of breath, weakness and fever. He was reportedly doing well after his most recent discharge but woke the am of 8/9 with drenching sweats, mild confusion and  difficulty concentrating. He reported ongoing low back pain since admit.  A CT of the chest with contrast was assessed which showed a RUL lobectomy with known complex right apical collection with mixed density that was smaller in size, waxing and waning appearance of other findings to include scattered ground glass opacities in the left lung with some increase in nodularity in the LLL, increase in size of 2.5 cm in  diameter complex region medially along the the left pleural effusion, stable subacute right fifth rib fracture with screw tracks in the right 5th/6th ribs. He was admitted per [facility] for further evaluation of febrile illness.  Blood cultures were obtained. COVID / flu testing negative. Empiric antibiotics were initiated - cefepime + vancomycin.  Due to ongoing pain, palliative care recommended Ketamine infusion; therefore, PCCM asked to see to assist with care in SDU.  Pertinent  Medical History  Poorly Differentiated Adenocarcinoma of the Lung HTN  Anxiety / Depression   COVID Infection 11/2020  Significant Hospital Events: Including procedures, antibiotic start and stop dates in addition to other pertinent events   8/9 Admit with fever, weakness, SOB   Interim History / Subjective:  Having discomfort in hips and low back, feels that bed pressure system is not helping this.  Objective   Blood pressure (!) 155/97, pulse (!) 105, temperature 98.9 F (37.2 C), temperature source Oral, resp. rate 10, height 5\' 10"  (1.778 m), weight 80.1 kg, SpO2 100 %.        Intake/Output  Summary (Last 24 hours) at 04/18/2021 0814 Last data filed at 04/18/2021 0700 Gross per 24 hour  Intake 1638.37 ml  Output 1550 ml  Net 88.37 ml    Filed Weights   04/17/21 0241 04/17/21 1210  Weight: 80.7 kg 80.1 kg    Examination: General: chronically ill appearing adult male lying in bed watching TV, in NAD HENT: [place]/AT, MMM Lungs: non-labored at rest, diminished on right, clear on left  Cardiovascular:  RRR, no M/R/G Abdomen: soft, non-tender, BSx4 active  Extremities: warm/dry, no edema. Small areas of erythema on arm, back and chest - pt reports from tape/IV's  Neuro: Awake, alert. Able to participate in conversation / oriented.    Resolved Hospital Problem list     Assessment & Plan:   Sepsis with Fever, Weakness Suspect pulmonary source, possible aspiration vs infection not targeted by Augmentin (was on at home)>  He still has broken tooth on right lower that was previously planned to be pulled but has not been yet (was to get a root canal but tooth was fractured > instead needs to be pulled).  Of note, PCT and lactate have been negative. -Continue empiric cefepime, consider change to clinda 8/11 (had been on outpatient course of Augmentin x 14 days) -D/c vanc, flagyl -follow blood cultures -sputum culture if able  -mobilize, PT efforts -tylenol for fever / comfort -follow CBC  -consider barium swallow given active radiation to chest once more comfortable -will need follow up with dentist at discharge   Poorly Differentiated Adenocarcinoma of Lung  RUL Fluid Collection  Currently undergoing radiation therapy, planned through 8/29.  -will need outpatient follow up at discharge with radiation oncology & Dr. Julien Nordmann -no acute interventions needed for RUL fluid collection  Chronic Pain Low Back Pain - pt describes getting out of his recliner in an unusual way (keeps foot up and swings legs wide to get up), ? SI joint pain. - Continue Gabapentin - Continue Duragesic, MSIR, low dose Ketamine per Dr. [facility] (right click and "Reselect all SmartList Selections" daily)  Diet/type: Regular consistency (see orders) DVT prophylaxis: SCD GI prophylaxis: N/A Lines: N/A Foley:  N/A Code Status:  DNR Last date of multidisciplinary goals of care discussion - interdisciplinary discussion with Dr. Ander Slade, Dr. Hilma Favors, NP, patient and wife at bedside 8/9.  Reviewed goals of  care, disease trajectory and plan of care.  DNR established but continue all medical care otherwise.     Montey Hora, [facility] For pager details, please see AMION or use Epic chat  After 1900, please call [facility] for cross coverage needs 04/18/2021, 8:29 AM

## 2021-04-18 NOTE — Progress Notes (Signed)
[facility]) Hospital Liaison note:  This is a pending outpatient-based Palliative Care patient. Will continue to follow for disposition.  Please call with any outpatient palliative questions or concerns.  Thank you, Lorelee Market, LPN [facility] Liaison [phone]

## 2021-04-18 NOTE — TOC Initial Note (Signed)
Transition of Care [facility]) - Initial/Assessment Note    Patient Details  Name: Christopher Carter MRN: [id number] Date of Birth: 04-Apr-1961  Transition of Care [facility]) CM/SW Contact:    Leeroy Cha, RN Phone Number: 04/18/2021, 8:48 AM  Clinical Narrative:                  found in March to have a large right apical opacity on imaging after ongoing cough from COVID.  At that time the CT showed he had a RUL mass that was 7.5 x 6.4. 6.4 cm abutting the pleural surface without definite chest wall invasion. PET scan 12/13/20 confirmed markedly hypermetabolic RUL mass consistent with primary bronchogenic neoplasm.  MRI Brain was negative for metastatic disease. On 01/05/21 he underwent FOB with EBUS with pathology consistent with non-small cell lung cancer.  He was referred to CVTS in early May 2022 & was admitted for VATS with RUL lobectomy 01/22/2021 with positive margin posteriorly near the spine.  Post operative course was complicated by significant pain in chest wall & back.  Additionally, he had a fluid collection in the RUL.  He was admitted from 6/19-6/29 with hemoptysis and pain.  He was treated for possible pulmonary infection.  He returned to the hospital from 7/10-04/10/21 with ongoing hypoxic respiratory failure, uncontrolled pain, & tooth abscess.  During that admission he had a repeat FOB on 7/11 which showed no active bleeding.  He had CT imaging which showed new gas in the the known complex right apical fluid collection and this was drained via chest tube per IR (chest tube removed 04/09/21). He started radiotherapy for the bronchial and pleural margins and is expected to complete this 05/07/2021.   He returns to the ER at [facility] on 8/9 with reports of shortness of breath, weakness and fever. He was reportedly doing well after his most recent discharge but woke the am of 8/9 with drenching sweats, mild confusion and difficulty concentrating. He reported ongoing low back pain since admit.  A CT of  the chest with contrast was assessed which showed a RUL lobectomy with known complex right apical collection with mixed density that was smaller in size, waxing and waning appearance of other findings to include scattered ground glass opacities in the left lung with some increase in nodularity in the LLL, increase in size of 2.5 cm in  diameter complex region medially along the the left pleural effusion, stable subacute right fifth rib fracture with screw tracks in the right 5th/6th ribs. He was admitted per [facility] for further evaluation of febrile illness.  Blood cultures were obtained. COVID / flu testing negative. Empiric antibiotics were initiated - cefepime + vancomycin.   Due to ongoing pain, palliative care recommended Ketamine infusion; therefore, PCCM asked to see to assist with care in SDU.   Pertinent  Medical History  Poorly Differentiated Adenocarcinoma of the Lung HTN Anxiety / Depression   COVID Infection 11/2020   Significant Hospital Events: Including procedures, antibiotic start and stop dates in addition to other pertinent events  8/9 Admit with fever, weakness, SOB TOC PLAN OF CARE: will follow to see if needs hhc for home evaluation, toc needs and progress  Expected Discharge Plan: [place] Barriers to Discharge: Continued Medical Work up   Patient Goals and CMS Choice Patient states their goals for this hospitalization and ongoing recovery are:: to go back home      Expected Discharge Plan and Services Expected Discharge Plan: Home w  Home Health Services   Discharge Planning Services: CM Consult   Living arrangements for the past 2 months: Single Family Home Expected Discharge Date:  (unknown)                                    Prior Living Arrangements/Services Living arrangements for the past 2 months: Single Family Home Lives with:: Spouse Patient language and need for interpreter reviewed:: Yes Do you feel safe going back to the  place where you live?: Yes            Criminal Activity/Legal Involvement Pertinent to Current Situation/Hospitalization: No - Comment as needed  Activities of Daily Living Home Assistive Devices/Equipment: Nebulizer, Oxygen ADL Screening (condition at time of admission) Patient's cognitive ability adequate to safely complete daily activities?: Yes Is the patient deaf or have difficulty hearing?: No Does the patient have difficulty seeing, even when wearing glasses/contacts?: No Does the patient have difficulty concentrating, remembering, or making decisions?: No Patient able to express need for assistance with ADLs?: Yes Does the patient have difficulty dressing or bathing?: No Independently performs ADLs?: Yes (appropriate for developmental age) Does the patient have difficulty walking or climbing stairs?: Yes (secondary to shortness of breath and weakness) Weakness of Legs: Both Weakness of Arms/Hands: None  Permission Sought/Granted                  Emotional Assessment Appearance:: Appears stated age     Orientation: : Oriented to Self, Oriented to Place, Oriented to  Time, Oriented to Situation Alcohol / Substance Use: Not Applicable Psych Involvement: No (comment)  Admission diagnosis:  Respiratory infection [J98.8] Fever [R50.9] Multifocal pneumonia [J18.9] Sepsis without acute organ dysfunction, due to unspecified organism [facility]) [A41.9] Patient Active Problem List   Diagnosis Date Noted   Multifocal pneumonia 04/17/2021   Cancer related pain 04/17/2021   Pain in lower back 04/17/2021   Abscess of upper lobe of right lung with pneumonia [facility])    Palliative care encounter    Palliative care by specialist    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   Uncontrolled pain 03/18/2021   Leukocytosis 03/18/2021   Normocytic anemia 03/18/2021   Tooth abscess 03/18/2021   Thrombocytosis 03/18/2021   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   Acute on chronic  respiratory failure with hypoxia ([place]) 02/26/2021   Malignant neoplasm of right upper lobe of lung ([place]) 02/26/2021   Pneumonia of right lung due to infectious organism 02/26/2021   Sepsis due to pneumonia ([place]) 02/26/2021   Major depressive disorder 02/26/2021   S/P lobectomy of lung 01/22/2021   Essential hypertension 01/15/2021   Mass of upper lobe of right lung 12/22/2020   Hemoptysis 12/22/2020   PCP:  [name], MD Pharmacy:   [id number] - [place], [address] [place] [address] Phone: [phone] Fax: [phone]     Social Determinants of Health (SDOH) Interventions    Readmission Risk Interventions No flowsheet data found.

## 2021-04-18 NOTE — Progress Notes (Signed)
  Subjective: Sedated but recognized me and conversational Unable to find comfortable position  Objective: Vital signs in last 24 hours: Temp:  [97.7 F (36.5 C)-98.9 F (37.2 C)] 98 F (36.7 C) (08/10 1910) Pulse Rate:  [95-122] 103 (08/10 1830) Cardiac Rhythm: Sinus tachycardia (08/10 1900) Resp:  [10-23] 14 (08/10 1630) BP: (135-196)/(71-168) 181/89 (08/10 1830) SpO2:  [89 %-100 %] 97 % (08/10 1830) FiO2 (%):  [28 %] 28 % (08/10 0900)  Hemodynamic parameters for last 24 hours:    Intake/Output from previous day: 08/09 0701 - 08/10 0700 In: 1638.4 [I.V.:662.5; IV Piggyback:975.9] Out: 1550 [Urine:1550] Intake/Output this shift: No intake/output data recorded.  General appearance: cooperative, slowed mentation, and ill appearing  Lab Results: Recent Labs    04/17/21 0323 04/18/21 0305  WBC 7.7 6.9  HGB 8.9* 9.2*  HCT 28.4* 30.6*  PLT 223 203   BMET:  Recent Labs    04/17/21 0323 04/18/21 0305  NA 138 139  K 4.3 4.3  CL 98 102  CO2 30 28  GLUCOSE 158* 110*  BUN 9 11  CREATININE 0.65 0.48*  CALCIUM 9.1 8.8*    PT/INR:  Recent Labs    04/18/21 0622  LABPROT 14.4  INR 1.1   ABG    Component Value Date/Time   PHART 7.458 (H) 02/26/2021 0630   HCO3 28.7 (H) 02/26/2021 0630   TCO2 30 02/26/2021 0630   ACIDBASEDEF 1.3 01/18/2021 1504   O2SAT 96.0 02/26/2021 0630   CBG (last 3)  Recent Labs    04/18/21 0816 04/18/21 1135 04/18/21 1727  GLUCAP 134* 133* 180*    Assessment/Plan: S/P  Readmitted with fever and altered mental status, presumed sepsis Interestingly his WBC is normal and procalcitonin is only 0.12 Ct still shows apical fluid collection and probable abscess cavity in superior segment of lower lobe. Slightly smaller than on last CT Options are limited. Unclear is apical collection is infected, although culture were negative during last admission. Percutaneous drainage of apical collection was unsuccessful previously and he is not a  candidate for surgical intervention currently. Antibiotics per primary team. Pain control Palliative care involved     LOS: 1 day    Christopher Carter 04/18/2021

## 2021-04-18 NOTE — Progress Notes (Signed)
Palliative Care Progress Note  Received communication from RN that Christopher Carter's pain had acutely worsened this AM. He is unable to walk or bear weight and was having intractable pain. I urgently evaluated Christopher Carter and found him to be in extreme pain. He was shaking and unable to communicate details about location and specifics of his pain. On exam, his bilateral lower extremities were spasming at the level of his hip flexors-he could not tolerate a straight leg raise. His strength is reduced bilaterally but exam limited by pain.  I remained at bedside and titrated his ketamine infusion to 1mg /hr and RN administered 100mg  of fentanyl in order to achieve sedation and comfort.  [name] was at bedside and I updated her on this plan.  Obtain stat MRI of L-Spine and Pelvis- concern remains for metastatic disease or acute discitis in setting of new infection and fever.  Will  continue to titrate medication- pain control and QOL remain primary goals for Christopher Carter.  Lane Hacker, DO Palliative Medicine

## 2021-04-18 NOTE — Progress Notes (Signed)
Palliative MD at the bedside. Adjusting pain medications. Pt is shaking in pain. Ketamine titrated.See new orders

## 2021-04-18 NOTE — Progress Notes (Signed)
PROGRESS NOTE    [place]  DGL:[id number] DOB: 12-17-1960 DOA: 04/17/2021 PCP: [name], MD    Brief Narrative:  60 year old gentleman with history of adenocarcinoma of the lung, hypertension, anxiety and depression presented to the emergency room with fever and chills.  Recently diagnosed with lung cancer status post resection, multiple complications including right apical abscess treated with chest tube drainage, pain control challenges, treated with radiation and discharged home after complicated hospitalizations on 8/2.  Patient tells me that he slept, had taken a dose of sleep medicine in the evening and middle of the night was found confused and with high temperature.  His main issue is pain all over.  Patient's wife noted patient to be diaphoretic and febrile and confused. In the emergency room afebrile.  CTA PE showed slight improvement on fluid collection seen previously, scattered groundglass opacities, left base pleural tumor with possible superadded infection.  Started on broad-spectrum antibiotics and admitted to the hospital. Admitted to stepdown unit because he started on ketamine infusion for pain relief.  Also followed by pulmonary.   Assessment & Plan:   Principal Problem:   Sepsis [facility]) Active Problems:   Palliative care encounter   Multifocal pneumonia   Cancer related pain   Pain in lower back  Sepsis present on admission, likely pulmonary source.  Likely incompletely treated pneumonia.  With the structural changes in the lungs.  Failed outpatient therapy with Augmentin.  Procalcitonin normal.  Lactic acid normal. patient has   Temperature <96.8   Heart rate >90 And suspect his source of infections is pneumonia. Antibiotics to treat bacterial pneumonia with cefepime. Chest physiotherapy, incentive spirometry, deep breathing exercises, sputum induction, mucolytic's and bronchodilators. Sputum cultures, blood cultures, Legionella and streptococcal  antigen. Supplemental oxygen to keep saturations more than 90%. There is also possibility that his fever and systemic symptoms could be related to tumor.  Poorly differentiated adenocarcinoma of the lung: Currently on radiation therapy.  Outpatient follow-up with oncology.  Chronic pain/cancer related pain: Significant pain issues.  Managed by palliative care as outpatient.  Seen by palliative care in the hospital.  Currently remains on concentrated morphine, fentanyl injections, diazepam and also on low-dose ketamine infusion for pain relief.  Also on gabapentin. Remains challenging. Appreciate palliative team help.  Anxiety: Patient on Zoloft that he will continue.   DVT prophylaxis: Place and maintain sequential compression device Start: 04/17/21 1202 SCDs Start: 04/17/21 1020   Code Status: DNR Family Communication: None Disposition Plan: Status is: Inpatient  Remains inpatient appropriate because:Ongoing active pain requiring inpatient pain management and Inpatient level of care appropriate due to severity of illness  Dispo: The patient is from: Home              Anticipated d/c is to: Home              Patient currently is not medically stable to d/c.   Difficult to place patient No         Consultants:  Palliative Pulmonary  Procedures:  None  Antimicrobials:  Antibiotics Given (last 72 hours)     Date/Time Action Medication Dose Rate   04/17/21 0410 New Bag/Given   vancomycin (VANCOREADY) IVPB 1500 mg/300 mL 1,500 mg 150 mL/hr   04/17/21 0630 New Bag/Given   ceFEPIme (MAXIPIME) 2 g in sodium chloride 0.9 % 100 mL IVPB 2 g 200 mL/hr   04/17/21 0738 New Bag/Given   metroNIDAZOLE (FLAGYL) IVPB 500 mg 500 mg 100 mL/hr   04/17/21  1354 New Bag/Given   ceFEPIme (MAXIPIME) 2 g in sodium chloride 0.9 % 100 mL IVPB 2 g 200 mL/hr   04/17/21 1559 New Bag/Given   vancomycin (VANCOREADY) IVPB 1250 mg/250 mL 1,250 mg 166.7 mL/hr   04/17/21 2113 New Bag/Given    ceFEPIme (MAXIPIME) 2 g in sodium chloride 0.9 % 100 mL IVPB 2 g 200 mL/hr   04/17/21 2209 New Bag/Given   metroNIDAZOLE (FLAGYL) IVPB 500 mg 500 mg 100 mL/hr   04/18/21 0341 New Bag/Given   vancomycin (VANCOREADY) IVPB 1250 mg/250 mL 1,250 mg 166.7 mL/hr   04/18/21 0529 New Bag/Given   ceFEPIme (MAXIPIME) 2 g in sodium chloride 0.9 % 100 mL IVPB 2 g 200 mL/hr          Subjective: Patient seen and examined in the morning rounds.  Afebrile overnight.  On 1 to 2 L of oxygen and maintaining mostly 100%.  He tells me that he has some dry cough but no sputum production. Hurts everywhere.  The bed is very uncomfortable because it keeps moving around and does not let him rest.  Is very distressed because of the pain but denies any shortness of breath.  Objective: Vitals:   04/18/21 0830 04/18/21 0900 04/18/21 0930 04/18/21 1000  BP: 138/81 (!) 183/91 (!) 163/84 (!) 143/88  Pulse: (!) 104 (!) 116 (!) 108 (!) 108  Resp: 13 (!) 23 13 14   Temp:      TempSrc:      SpO2: 100% 99% 98% 97%  Weight:      Height:        Intake/Output Summary (Last 24 hours) at 04/18/2021 1018 Last data filed at 04/18/2021 0900 Gross per 24 hour  Intake 1781.52 ml  Output 1950 ml  Net -168.48 ml   Filed Weights   04/17/21 0241 04/17/21 1210  Weight: 80.7 kg 80.1 kg    Examination:  General exam: Appears anxious and in mild distress with pain. Sick looking and debilitated. Respiratory system: No added sounds.  Some conducted airway sounds. Cardiovascular system: S1 & S2 heard, RRR.  Tachycardic.   Gastrointestinal system: Soft and nontender.  Bowel sounds present. Central nervous system: Alert and oriented. No focal neurological deficits. Extremities: Symmetric 5 x 5 power. Skin: No rashes, lesions or ulcers Psychiatry: Judgement and insight appear normal.  Anxious.    Data Reviewed: I have personally reviewed following labs and imaging studies  CBC: Recent Labs  Lab 04/17/21 0323  04/18/21 0305  WBC 7.7 6.9  NEUTROABS 6.4  --   HGB 8.9* 9.2*  HCT 28.4* 30.6*  MCV 96.3 96.8  PLT 223 086   Basic Metabolic Panel: Recent Labs  Lab 04/17/21 0323 04/18/21 0305  NA 138 139  K 4.3 4.3  CL 98 102  CO2 30 28  GLUCOSE 158* 110*  BUN 9 11  CREATININE 0.65 0.48*  CALCIUM 9.1 8.8*   GFR: Estimated Creatinine Clearance: 103.9 mL/min (A) (by C-G formula based on SCr of 0.48 mg/dL (L)). Liver Function Tests: Recent Labs  Lab 04/17/21 0323 04/18/21 0305  AST 10* 11*  ALT 12 11  ALKPHOS 71 71  BILITOT 0.7 0.7  PROT 6.2* 5.9*  ALBUMIN 3.1* 2.8*   No results for input(s): LIPASE, AMYLASE in the last 168 hours. No results for input(s): AMMONIA in the last 168 hours. Coagulation Profile: Recent Labs  Lab 04/17/21 0323 04/18/21 0622  INR 1.0 1.1   Cardiac Enzymes: No results for input(s): CKTOTAL, CKMB, CKMBINDEX, TROPONINI in  the last 168 hours. BNP (last 3 results) No results for input(s): PROBNP in the last 8760 hours. HbA1C: No results for input(s): HGBA1C in the last 72 hours. CBG: Recent Labs  Lab 04/17/21 1232 04/17/21 1608 04/17/21 2101 04/18/21 0816  GLUCAP 148* 127* 141* 134*   Lipid Profile: No results for input(s): CHOL, HDL, LDLCALC, TRIG, CHOLHDL, LDLDIRECT in the last 72 hours. Thyroid Function Tests: No results for input(s): TSH, T4TOTAL, FREET4, T3FREE, THYROIDAB in the last 72 hours. Anemia Panel: No results for input(s): VITAMINB12, FOLATE, FERRITIN, TIBC, IRON, RETICCTPCT in the last 72 hours. Sepsis Labs: Recent Labs  Lab 04/17/21 0323 04/18/21 0558  PROCALCITON  --  <0.10  LATICACIDVEN 0.7  --     Recent Results (from the past 240 hour(s))  SARS CORONAVIRUS 2 (TAT 6-24 HRS) Nasopharyngeal Nasopharyngeal Swab     Status: None   Collection Time: 04/17/21  5:42 AM   Specimen: Nasopharyngeal Swab  Result Value Ref Range Status   SARS Coronavirus 2 NEGATIVE NEGATIVE Final    Comment: (NOTE) SARS-CoV-2 target nucleic  acids are NOT DETECTED.  The SARS-CoV-2 RNA is generally detectable in upper and lower respiratory specimens during the acute phase of infection. Negative results do not preclude SARS-CoV-2 infection, do not rule out co-infections with other pathogens, and should not be used as the sole basis for treatment or other patient management decisions. Negative results must be combined with clinical observations, patient history, and epidemiological information. The expected result is Negative.  Fact Sheet for Patients: SugarRoll.be  Fact Sheet for Healthcare Providers: https://www.woods-mathews.com/  This test is not yet approved or cleared by the [place] FDA and  has been authorized for detection and/or diagnosis of SARS-CoV-2 by FDA under an Emergency Use Authorization (EUA). This EUA will remain  in effect (meaning this test can be used) for the duration of the COVID-19 declaration under Se ction 564(b)(1) of the Act, 21 U.S.C. section 360bbb-3(b)(1), unless the authorization is terminated or revoked sooner.  Performed at [facility], [place] [address] [place], [place] [address]   Resp Panel by RT-PCR (Flu A&B, Covid) Nasopharyngeal Swab     Status: None   Collection Time: 04/17/21  8:54 AM   Specimen: Nasopharyngeal Swab; Nasopharyngeal(NP) swabs in vial transport medium  Result Value Ref Range Status   SARS Coronavirus 2 by RT PCR NEGATIVE NEGATIVE Final    Comment: (NOTE) SARS-CoV-2 target nucleic acids are NOT DETECTED.  The SARS-CoV-2 RNA is generally detectable in upper respiratory specimens during the acute phase of infection. The lowest concentration of SARS-CoV-2 viral copies this assay can detect is 138 copies/mL. A negative result does not preclude SARS-Cov-2 infection and should not be used as the sole basis for treatment or other patient management decisions. A negative result may occur with  improper specimen  collection/handling, submission of specimen other than nasopharyngeal swab, presence of viral mutation(s) within the areas targeted by this assay, and inadequate number of viral copies(<138 copies/mL). A negative result must be combined with clinical observations, patient history, and epidemiological information. The expected result is Negative.  Fact Sheet for Patients:  EntrepreneurPulse.com.au  Fact Sheet for Healthcare Providers:  IncredibleEmployment.be  This test is no t yet approved or cleared by the [place] FDA and  has been authorized for detection and/or diagnosis of SARS-CoV-2 by FDA under an Emergency Use Authorization (EUA). This EUA will remain  in effect (meaning this test can be used) for the duration of the COVID-19 declaration  under Section 564(b)(1) of the Act, 21 U.S.C.section 360bbb-3(b)(1), unless the authorization is terminated  or revoked sooner.       Influenza A by PCR NEGATIVE NEGATIVE Final   Influenza B by PCR NEGATIVE NEGATIVE Final    Comment: (NOTE) The Xpert Xpress SARS-CoV-2/FLU/RSV plus assay is intended as an aid in the diagnosis of influenza from Nasopharyngeal swab specimens and should not be used as a sole basis for treatment. Nasal washings and aspirates are unacceptable for Xpert Xpress SARS-CoV-2/FLU/RSV testing.  Fact Sheet for Patients: EntrepreneurPulse.com.au  Fact Sheet for Healthcare Providers: IncredibleEmployment.be  This test is not yet approved or cleared by the [place] FDA and has been authorized for detection and/or diagnosis of SARS-CoV-2 by FDA under an Emergency Use Authorization (EUA). This EUA will remain in effect (meaning this test can be used) for the duration of the COVID-19 declaration under Section 564(b)(1) of the Act, 21 U.S.C. section 360bbb-3(b)(1), unless the authorization is terminated or revoked.  Performed at [facility], [place] [address], [place], [place] [address]   MRSA Next Gen by PCR, Nasal     Status: None   Collection Time: 04/17/21  1:16 PM   Specimen: Nasal Mucosa; Nasal Swab  Result Value Ref Range Status   MRSA by PCR Next Gen NOT DETECTED NOT DETECTED Final    Comment: (NOTE) The GeneXpert MRSA Assay (FDA approved for NASAL specimens only), is one component of a comprehensive MRSA colonization surveillance program. It is not intended to diagnose MRSA infection nor to guide or monitor treatment for MRSA infections. Test performance is not FDA approved in patients less than 84 years old. Performed at [facility], [place] [address], [place], [place] [address]          Radiology Studies: CT Chest W Contrast  Result Date: 04/17/2021 CLINICAL DATA:  Fever of unknown origin. History of lung cancer with ongoing radiation therapy. EXAM: CT CHEST WITH CONTRAST TECHNIQUE: Multidetector CT imaging of the chest was performed during intravenous contrast administration. CONTRAST:  27mL OMNIPAQUE IOHEXOL 350 MG/ML SOLN COMPARISON:  04/09/2021 chest CT FINDINGS: Cardiovascular: Left anterior descending coronary artery atherosclerosis along with mild thoracic aortic atherosclerotic calcification. Mediastinum/Nodes: Stable small scattered mediastinal lymph nodes. Lungs/Pleura: There has been removal of the pigtail catheter from the large complex process involving the right lung apex with extension into the right middle lobe and possibly the right lower lobe. Prior right upper lobectomy. The complex right apical process measures about 11.7 by 8.3 by 11.1 cm (volume = 560 cm^3) and contains mostly complex material with some scattered mixed and gas, and with a relatively stable appearing cavitary component in the upper portion of the left lower lobe. When I measure this process in the same fashion on the 04/09/2021 exam I arrive at a size of 11.8 by 8.3 by 12.1 cm (volume = 620  cm^3), and is this process is minimally reduced in volume compared to 04/09/2021. Small amount of airspace opacity anteriorly in the right middle lobe on image 55 of series 4 previously had ground-glass opacity and has currently mildly increased in density. Secondary pulmonary lobular interstitial accentuation at the right lung base with scattered ground-glass opacities. A small right pleural effusion is observed, and medially along this pleural effusion on image 87 series 2 there is a 2.5 by 2.5 cm nodule or complex lesion which previously measured about 2.0 by 1.6 cm. I cannot exclude a tumor deposit in this vicinity. Volume loss and ground-glass  opacity along the margins of the extension of the complex right apical fluid collection into the right lower lobe noted. This is similar to prior. Slight improvement in the scattered ground-glass opacities in the left lung compared to previous. Small clustered nodules in the left upper lobe on images 71-78 of series 4 measuring up to about 0.3 cm in diameter, similar to prior. Right subpleural nodule 0.7 cm in diameter on image 108 series 4, previously 0.5 cm. Right subpleural nodule 0.9 by 0.8 cm on image 110 series 4, previously 0.7 by 0.5 cm. A peripheral right lower lobe nodule measures up to 0.6 cm in diameter on image 114 series 4, previously 0.4 cm. Accordingly some of the left basilar nodules are enlarging. There is a new 4 mm left lower lobe subpleural nodule on image 98 series 4. Upper Abdomen: Fullness of the right adrenal gland is incompletely characterized. Musculoskeletal: Stable right fifth rib fracture. Screw tracks in the right fifth and sixth ribs. IMPRESSION: 1. Right upper lobectomy with complex right apical collection primarily with mixed density measuring 560 cubic cm, slightly reduced from prior measurement of 620 cubic cm on 04/09/2021. This continues to bulge down into the right middle lobe and right lower lobe with a right lower lobe superior  segmental cavitary component as on previous exams. The pigtail catheter has been removed from this collection. 2. Waxing and waning appearance of other findings, including mostly improved scattered ground-glass densities in the left lung although with some increase in nodularity in the left lower lobe which is nonspecific and could be from atypical infection (less likely malignant spread). Similar degree of secondary pulmonary lobular interstitial accentuation at the right lung base with scattered ground-glass opacities. 3. Increase in size of a 2.5 cm in diameter complex region medially along the left pleural effusion. I cannot exclude pleural tumor. This previously measured 2.0 by 1.6 cm. Surveillance will likely be warranted. 4. Given the widespread scattered complex pulmonary opacities and especially the findings in the right lung, superimposed infection is not excluded as a potential cause for fever. 5. Stable subacute right fifth rib fracture with screw tracks in the right fifth and sixth ribs. Electronically Signed   By: Van Clines M.D.   On: 04/17/2021 07:38   DG Chest Port 1 View  Result Date: 04/17/2021 CLINICAL DATA:  Shortness of breath with fevers EXAM: PORTABLE CHEST 1 VIEW COMPARISON:  04/10/2021 FINDINGS: Cardiac shadow is stable. Left lung is clear. Right lung again demonstrates soft tissue density in the apex stable in appearance from the prior exam. Postsurgical changes are noted in the right apex. No new focal abnormality is noted. IMPRESSION: Stable appearance of the chest when compare with the prior exam. Electronically Signed   By: Inez Catalina M.D.   On: 04/17/2021 03:09        Scheduled Meds:  vitamin C  1,000 mg Oral Daily   celecoxib  200 mg Oral BID   Chlorhexidine Gluconate Cloth  6 each Topical Daily   diazepam  2 mg Oral Q12H   [START ON 04/20/2021] fentaNYL  1 patch Transdermal Q72H   ferrous sulfate  325 mg Oral Q breakfast   gabapentin  300 mg Oral BID WC    And   gabapentin  600 mg Oral QHS   insulin aspart  0-15 Units Subcutaneous TID WC   insulin aspart  0-5 Units Subcutaneous QHS   ipratropium-albuterol  3 mL Nebulization BID   latanoprost  1 drop Both Eyes QHS  mouth rinse  15 mL Mouth Rinse BID   pantoprazole  40 mg Oral Daily   sertraline  100 mg Oral Daily   vitamin B-12  1,000 mcg Oral Daily   Continuous Infusions:  sodium chloride 10 mL/hr at 04/18/21 0900   ceFEPime (MAXIPIME) IV Stopped (04/18/21 0559)   ketamine (KETALAR) Adult IV Infusion 0.2 mg/kg/hr (04/18/21 0919)     LOS: 1 day    Time spent: 32 minutes    Barb Merino, MD [facility] Pager [phone]

## 2021-04-19 ENCOUNTER — Ambulatory Visit
Admission: RE | Admit: 2021-04-19 | Discharge: 2021-04-19 | Disposition: A | Discharge status: Home / Self Care | Payer: 59 | Source: Ambulatory Visit | Attending: Radiation Oncology | Admitting: Radiation Oncology

## 2021-04-19 ENCOUNTER — Inpatient Hospital Stay: Payer: Self-pay

## 2021-04-19 ENCOUNTER — Ambulatory Visit: Payer: 59 | Admitting: Radiation Oncology

## 2021-04-19 ENCOUNTER — Encounter (HOSPITAL_COMMUNITY): Payer: Self-pay | Admitting: Internal Medicine

## 2021-04-19 ENCOUNTER — Ambulatory Visit: Payer: 59 | Admitting: Thoracic Surgery (Cardiothoracic Vascular Surgery)

## 2021-04-19 DIAGNOSIS — C7951 Secondary malignant neoplasm of bone: Secondary | ICD-10-CM | POA: Diagnosis present

## 2021-04-19 DIAGNOSIS — G893 Neoplasm related pain (acute) (chronic): Secondary | ICD-10-CM | POA: Diagnosis not present

## 2021-04-19 DIAGNOSIS — J189 Pneumonia, unspecified organism: Secondary | ICD-10-CM | POA: Diagnosis not present

## 2021-04-19 DIAGNOSIS — R338 Other retention of urine: Secondary | ICD-10-CM | POA: Diagnosis not present

## 2021-04-19 LAB — GLUCOSE, CAPILLARY: Glucose-Capillary: 190 mg/dL — ABNORMAL HIGH (ref 70–99)

## 2021-04-19 LAB — C-REACTIVE PROTEIN: CRP: 14.2 mg/dL — ABNORMAL HIGH (ref <1.0)

## 2021-04-19 LAB — SEDIMENTATION RATE: Sed Rate: 101 mm/hr — ABNORMAL HIGH (ref 0–16)

## 2021-04-19 MED ORDER — DIAZEPAM 5 MG/ML IJ SOLN
5.0000 mg | Freq: Once | INTRAMUSCULAR | Status: AC
  Administered 2021-04-19: 5 mg via INTRAVENOUS

## 2021-04-19 MED ORDER — DIAZEPAM 5 MG PO TABS
5.0000 mg | ORAL_TABLET | Freq: Three times a day (TID) | ORAL | Status: DC
  Administered 2021-04-19 – 2021-04-25 (×16): 5 mg via ORAL
  Filled 2021-04-19 (×18): qty 1

## 2021-04-19 MED ORDER — KETOROLAC TROMETHAMINE 15 MG/ML IJ SOLN
15.0000 mg | INTRAMUSCULAR | Status: AC
  Administered 2021-04-19: 15 mg via INTRAVENOUS
  Filled 2021-04-19: qty 1

## 2021-04-19 MED ORDER — FENTANYL 2500MCG IN NS 250ML (10MCG/ML) PREMIX INFUSION
0.0000 ug/h | INTRAVENOUS | Status: DC
  Administered 2021-04-19: 50 ug/h via INTRAVENOUS
  Administered 2021-04-20 – 2021-04-21 (×2): 150 ug/h via INTRAVENOUS
  Administered 2021-04-22: 200 ug/h via INTRAVENOUS
  Administered 2021-04-22: 175 ug/h via INTRAVENOUS
  Administered 2021-04-23 – 2021-04-24 (×3): 200 ug/h via INTRAVENOUS
  Filled 2021-04-19 (×8): qty 250

## 2021-04-19 MED ORDER — METHADONE HCL 10 MG PO TABS
10.0000 mg | ORAL_TABLET | Freq: Two times a day (BID) | ORAL | Status: DC
  Administered 2021-04-19 – 2021-04-20 (×2): 10 mg via ORAL
  Filled 2021-04-19 (×3): qty 1

## 2021-04-19 MED ORDER — CELECOXIB 200 MG PO CAPS
400.0000 mg | ORAL_CAPSULE | Freq: Two times a day (BID) | ORAL | Status: DC
  Administered 2021-04-19 – 2021-04-23 (×8): 400 mg via ORAL
  Filled 2021-04-19 (×11): qty 2

## 2021-04-19 MED ORDER — FENTANYL CITRATE (PF) 100 MCG/2ML IJ SOLN
50.0000 ug | INTRAMUSCULAR | Status: DC | PRN
  Administered 2021-04-19: 50 ug via INTRAVENOUS
  Filled 2021-04-19 (×3): qty 2

## 2021-04-19 MED ORDER — FENTANYL BOLUS VIA INFUSION
50.0000 ug | INTRAVENOUS | Status: DC | PRN
Start: 2021-04-19 — End: 2021-04-25
  Administered 2021-04-19 – 2021-04-25 (×23): 50 ug via INTRAVENOUS
  Filled 2021-04-19: qty 50

## 2021-04-19 MED ORDER — LABETALOL HCL 100 MG PO TABS
50.0000 mg | ORAL_TABLET | Freq: Two times a day (BID) | ORAL | Status: DC
  Administered 2021-04-19 – 2021-04-20 (×2): 50 mg via ORAL
  Filled 2021-04-19 (×3): qty 1

## 2021-04-19 MED ORDER — DEXAMETHASONE SODIUM PHOSPHATE 10 MG/ML IJ SOLN
8.0000 mg | Freq: Two times a day (BID) | INTRAMUSCULAR | Status: DC
  Administered 2021-04-19 – 2021-04-24 (×11): 8 mg via INTRAVENOUS
  Filled 2021-04-19 (×11): qty 1

## 2021-04-19 MED ORDER — SODIUM CHLORIDE 0.9% FLUSH
10.0000 mL | Freq: Two times a day (BID) | INTRAVENOUS | Status: DC
  Administered 2021-04-19 – 2021-04-24 (×7): 10 mL
  Administered 2021-04-24: 20 mL
  Administered 2021-04-25: 10 mL

## 2021-04-19 MED ORDER — FENTANYL CITRATE (PF) 100 MCG/2ML IJ SOLN
100.0000 ug | INTRAMUSCULAR | Status: DC | PRN
  Administered 2021-04-19 – 2021-04-20 (×7): 100 ug via INTRAVENOUS
  Filled 2021-04-19 (×6): qty 2

## 2021-04-19 MED ORDER — SODIUM CHLORIDE 0.9% FLUSH: 10.0000 mL | INTRAVENOUS | Status: DC | PRN

## 2021-04-19 MED ORDER — FENTANYL CITRATE (PF) 100 MCG/2ML IJ SOLN
100.0000 ug | Freq: Once | INTRAMUSCULAR | Status: AC
  Administered 2021-04-19: 100 ug via INTRAVENOUS

## 2021-04-19 NOTE — Progress Notes (Addendum)
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 Despite very high doses of opioids and IV ketamine infusion he has had only short periods of rest and relief. I placed an order for a stat MRI this AM at 1040 for possible cauda equina-cord compression. MRI was done at apx 2000.   MRI findings show extensive metastatic disease involving multiple levels-T12-L1, L2-L3 with probable extension of tumor into the intervertebral space. Additionally there is probably myositis in the muscles and hip flexors which is highly suspicious for paraneoplastic polymyositis- this would explain the severity and refractory nature of his pain. A contrasted CT ordered on 8/1 of his lumbar spine and hips did not show any of the current findings seen on MRI suggesting a very aggressive tumor progression.  Additionally Christopher [place] has developed marked urinary retension this evening suggestive of Cauda Equina Syndrome along with his motor weakness in lower extremities.   Decadron 8mg  BID (16mg  daily) Added dose of Toradol for inflammatory pain Check inflammatory markers  Recommend urgent Neurosurgical Consult although he is high risk for surgery given fevers and recent infection- would need to consider outcomes of treating pain and preserving his functional status and mobility. I/O cath for Urinary symptoms  Maintain ketamine and fentanyl Notify Radiation Oncology-may need acute tx.  I will discuss with [place] and his wife first thing in AM. We will need to have additional discussion about his goals of care and I will notify Dr. Earlie Server of this rapid progression since it may alter his treatment plan and options.  Lane Hacker,  DO Palliative Medicine  Time: 35 minutes Greater than 50%  of this time was spent counseling and coordinating care related to the above assessment and plan.

## 2021-04-19 NOTE — Progress Notes (Signed)
[facility]) Hospital Liaison note:  This is a pending outpatient-based Palliative Care patient. Will continue to follow for disposition.  Please call with any outpatient palliative questions or concerns.  Thank you, Lorelee Market, LPN [facility] Liaison [phone]

## 2021-04-19 NOTE — Progress Notes (Signed)
Peripherally Inserted Central Catheter Placement  The IV Nurse has discussed with the patient and/or persons authorized to consent for the patient, the purpose of this procedure and the potential benefits and risks involved with this procedure.  The benefits include less needle sticks, lab draws from the catheter, and the patient may be discharged home with the catheter. Risks include, but not limited to, infection, bleeding, blood clot (thrombus formation), and puncture of an artery; nerve damage and irregular heartbeat and possibility to perform a PICC exchange if needed/ordered by physician.  Alternatives to this procedure were also discussed.  Bard Power PICC patient education guide, fact sheet on infection prevention and patient information card has been provided to patient /or left at bedside.    PICC Placement Documentation  PICC Double Lumen 93/96/88 PICC Left Basilic 45 cm 0 cm (Active)  Exposed Catheter (cm) 0 cm 04/19/21 1659  Site Assessment Clean;Dry;Intact 04/19/21 1659  Lumen #1 Status Flushed;Saline locked;Blood return noted 04/19/21 1659  Lumen #2 Status Blood return noted;Saline locked;Flushed 04/19/21 1659  Dressing Type Transparent;Securing device 04/19/21 1659  Dressing Status Clean;Dry;Intact 04/19/21 1659  Antimicrobial disc in place? Yes 04/19/21 1659  Safety Lock Not Applicable 64/84/72 0721  Dressing Change Due 2021/05/26 04/19/21 1659       Frances Maywood 04/19/2021, 5:02 PM

## 2021-04-19 NOTE — Progress Notes (Signed)
We were notified by palliative medicine that the patient has been progressively having pain in the back that has been extremely difficult to manage, imaging of the lumbar spine last week with CT did not identify any disease.  He had an MRI of the lumbosacral spine yesterday that has shown concern for new disease from T12-L3 with concerns for canal compromise and impending cord compression. He was started on dexamethasone 8 mg every 12 hours.  Due to his pain and need for complex pain management, the patient has been a unable to participate in the conversation this morning by was able to talk with his wife by phone about the findings, the plan is that they will move forward with hospice in the outpatient setting once his pain can be stabilized.  Dr. Lisbeth Renshaw has reviewed his images from his MRI scan and his clinical course and would offer palliative radiotherapy from T12-L3 and prefers mark and start approach so that we can begin today.  We discussed the risks, benefits, short and long-term effects of radiotherapy and the patient's wife is in agreement to proceed as she is his surrogate Research scientist (physical sciences).  When they came downstairs to do simulation this morning we reviewed our conversation, we discussed treatment in 5 fractions to begin today.  We can anticipate relief from treatment towards the end of the 5 fractions.  We also discussed his current treatment plan, unfortunately his clinical course has taken a downward trajectory at a fast pace and what was originally felt to be local disease is now metastatic, the patient has had approximately 40 Gy to the postoperative area in the chest which would be higher dose than palliative therapy.  He is beginning to have symptoms of esophagitis per his wife and she favors discontinuing this therapy at this time out of concern for worsening quality of life with progressive esophagitis in the setting of clinical compromise.  We will discontinue treatment to the chest and  only proceed with his 5 fractions to the spine.  We can repeat convene on this issue if needed. Written consent is obtained by the patient's wife and placed in the chart, a copy was provided to the patient's wife as well.    Carola Rhine, PAC

## 2021-04-19 NOTE — Progress Notes (Signed)
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 Paraneoplastic myositis- inflammed proximal muscles on MRI espcially in the hip pelvic girdle region, he also has a mildly vasculitic rash on his upper chest and extremities. Urinary retension  Christopher Carter is dissociated this AM from Ketamine but not agitated and his pain is under better control. He has short term memory loss but is able to describe his current pain and situation. He is having ongoing urinary retension.  Met with patient and his wife this AM to discuss results of MRI and goals of care. Dr. Sloan Leiter was also at bedside. This is understandably devastating news and their grief is appropriate.   Christopher Carter and Christopher Carter both want to go home at this point-they are not interested in prolonging his suffering or pursuing treatment that is not going to improve his functional status, pain or QOL.I discussed hospice care and the terminal trajectory.   Recommendations: DNR Aggressive pain management with goal of discharging him home with hospice care. Place PICC Line Start continuous fentanyl infusion and Titrate fentanyl dose-he doesn't tolerate other IV opioids. Leave 191mg patch in place. Wean off ketamine this AM. Start Methadone 11mBID Schedule Valium q6 hours High dose steroid and NSAID Place indwelling foley Rad Onc will treat Spine today with plan for 5 XRT tx and stop tx to upper chest.  Will follow closely today.  ElLane HackerDO Palliative Medicine  Time: 65 minutes Greater than 50%  of this time was spent counseling and coordinating care related to the above assessment and plan.

## 2021-04-19 NOTE — Progress Notes (Signed)
PROGRESS NOTE    Christopher Carter  LPF:[id number] DOB: 1960/11/09 DOA: 04/17/2021 PCP: [name], MD    Brief Narrative:  60 year old gentleman with history of adenocarcinoma of the lung, hypertension, anxiety and depression presented to the emergency room with fever and chills.  Recently diagnosed with lung cancer status post resection, multiple complications including right apical abscess treated with chest tube drainage, pain control challenges, treated with radiation and discharged home after complicated hospitalizations on 8/2. Brought back to the hospital with severe unrelenting pain, fever and altered mental status. In the emergency room afebrile.  CTA PE showed slight improvement on fluid collection seen previously, scattered groundglass opacities, left base pleural tumor with possible superadded infection.  Started on broad-spectrum antibiotics and admitted to the hospital. Admitted to stepdown unit because he started on ketamine infusion for pain relief.  Also followed by pulmonary. Subsequent MRI spines consistent with very aggressive spread of cancer into spine with cord compression, myositis. Urinary retention.  8/11 hospice discussion initiated.    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  Likely incompletely treated pneumonia.  With the structural changes in the lungs.  Failed outpatient therapy with Augmentin.  Procalcitonin normal.  Lactic acid normal. patient has   Temperature <96.8   Heart rate >90 And suspect his source of infections is pneumonia. Antibiotics to treat bacterial pneumonia with cefepime. His fever may be metabolic also from tumor.Chest physiotherapy, incentive  spirometry, deep breathing exercises, sputum induction, mucolytic's and bronchodilators. Sputum cultures, blood cultures, Legionella and streptococcal antigen. Supplemental oxygen to keep saturations more than 90%. Will continue antibiotics until goal of care finalized.  Poorly differentiated adenocarcinoma of the lung: recurrence , aggressive spread into spine, widespread bone mets, cauda equina and cord compression: metastatic cancer pain. Patient was treated with ketamine infusion and fentanyl. Was found with severe back pain, neurological deficits, spasms and urinary retention. MRI thoracic and lumbar spine 8/10 with widespread metastatic disease, dural compression and cord compression. Still with severe unrelenting pain. 8/11, bedside goal of care discussion with palliative care Dr. Hilma Favors with patient and patient's wife.  Focuses on pain control.  Unlikely he will tolerate any cancer therapies.  He will not tolerate any neurosurgical intervention and this may not prove any helpful for survival.  -Titrating up ketamine, on fentanyl infusion.  PICC line.  Will need fentanyl infusion at home if able otherwise may need to go to residential hospice.  Continue fentanyl patch. -Other symptomatic treatment including laxatives, oxygen, Valium scheduled. -Discussing with radiation therapy whether patient will benefit with large dose of radiation on his back for symptom relief. -Goal is palliation and pain relief with safe transition to home with hospice. DNR/DNI. -Continue Zoloft. - oncology and radiation oncology to see    DVT prophylaxis: Place and maintain sequential compression device Start: 04/17/21 1202 SCDs Start: 04/17/21 1020   Code Status: DNR Family Communication: Wife at the bedside Disposition Plan: Status is: Inpatient  Remains inpatient appropriate because:Ongoing active pain requiring inpatient pain management and Inpatient level of care appropriate due to severity of  illness  Dispo: The patient is from: Home  Anticipated d/c is to: Home with possibly home hospice              Patient currently is not medically stable to d/c.   Difficult to place patient No         Consultants:  Palliative Pulmonary Oncology Radiation oncology  Procedures:  None  Antimicrobials:  Antibiotics Given (last 72 hours)     Date/Time Action Medication Dose Rate   04/17/21 0410 New Bag/Given   vancomycin (VANCOREADY) IVPB 1500 mg/300 mL 1,500 mg 150 mL/hr   04/17/21 0630 New Bag/Given   ceFEPIme (MAXIPIME) 2 g in sodium chloride 0.9 % 100 mL IVPB 2 g 200 mL/hr   04/17/21 0738 New Bag/Given   metroNIDAZOLE (FLAGYL) IVPB 500 mg 500 mg 100 mL/hr   04/17/21 1354 New Bag/Given   ceFEPIme (MAXIPIME) 2 g in sodium chloride 0.9 % 100 mL IVPB 2 g 200 mL/hr   04/17/21 1559 New Bag/Given   vancomycin (VANCOREADY) IVPB 1250 mg/250 mL 1,250 mg 166.7 mL/hr   04/17/21 2113 New Bag/Given   ceFEPIme (MAXIPIME) 2 g in sodium chloride 0.9 % 100 mL IVPB 2 g 200 mL/hr   04/17/21 2209 New Bag/Given   metroNIDAZOLE (FLAGYL) IVPB 500 mg 500 mg 100 mL/hr   04/18/21 0341 New Bag/Given   vancomycin (VANCOREADY) IVPB 1250 mg/250 mL 1,250 mg 166.7 mL/hr   04/18/21 0529 New Bag/Given   ceFEPIme (MAXIPIME) 2 g in sodium chloride 0.9 % 100 mL IVPB 2 g 200 mL/hr   04/18/21 1312 New Bag/Given   ceFEPIme (MAXIPIME) 2 g in sodium chloride 0.9 % 100 mL IVPB 2 g 200 mL/hr   04/18/21 2211 New Bag/Given   ceFEPIme (MAXIPIME) 2 g in sodium chloride 0.9 % 100 mL IVPB 2 g 200 mL/hr   04/19/21 0534 New Bag/Given   ceFEPIme (MAXIPIME) 2 g in sodium chloride 0.9 % 100 mL IVPB 2 g 200 mL/hr          Subjective: Patient seen and examined with palliative care physician Dr. Hilma Favors.  Wife was at the bedside. Patient was still in pain.  He was telling us that he is feeling as though he is talking to robots. He did tell us that he knows he is dying and pain is unbearable. He wants to  get some clarity of mind.  He wants to see his child and grandchild. He wants [place] to work on pain relief.  Is having hard time urinating.  Objective: Vitals:   04/19/21 0400 04/19/21 0725 04/19/21 0817 04/19/21 0900  BP: (!) 144/78   (!) 174/92  Pulse: 89   (!) 109  Resp: 10   12  Temp:  98.6 F (37 C)    TempSrc:  Oral    SpO2: 98%  98% 98%  Weight:      Height:        Intake/Output Summary (Last 24 hours) at 04/19/2021 1039 Last data filed at 04/19/2021 0900 Gross per 24 hour  Intake 933.29 ml  Output 1500 ml  Net -566.71 ml   Filed Weights   04/17/21 0241 04/17/21 1210  Weight: 80.7 kg 80.1 kg    Examination:  General exam: Appears anxious and in moderate distress with pain. Frail and debilitated. Respiratory system: No added sounds.  Some conducted airway sounds.   Cardiovascular system: S1 & S2 heard, RRR.  Tachycardic.   Gastrointestinal system: Soft and nontender.  Bowel sounds present. Central nervous system: Patient is alert and awake.  He is oriented  x2-3.  Appropriately sedated and has some delirium with ongoing pain and medications. He can move his upper extremities. Does not let us examine his lower back because of severe pain and spasm. Not able to move his lower legs, does not want [place] to mobilize.  He stated that he has severe pain and spasm. Range of motion not elicited, examination was limited because of known severe cancer related pain and to avoid aggravation of pain.    Data Reviewed: I have personally reviewed following labs and imaging studies  CBC: Recent Labs  Lab 04/17/21 0323 04/18/21 0305  WBC 7.7 6.9  NEUTROABS 6.4  --   HGB 8.9* 9.2*  HCT 28.4* 30.6*  MCV 96.3 96.8  PLT 223 903   Basic Metabolic Panel: Recent Labs  Lab 04/17/21 0323 04/18/21 0305  NA 138 139  K 4.3 4.3  CL 98 102  CO2 30 28  GLUCOSE 158* 110*  BUN 9 11  CREATININE 0.65 0.48*  CALCIUM 9.1 8.8*   GFR: Estimated Creatinine Clearance: 103.9 mL/min (A) (by  C-G formula based on SCr of 0.48 mg/dL (L)). Liver Function Tests: Recent Labs  Lab 04/17/21 0323 04/18/21 0305  AST 10* 11*  ALT 12 11  ALKPHOS 71 71  BILITOT 0.7 0.7  PROT 6.2* 5.9*  ALBUMIN 3.1* 2.8*   No results for input(s): LIPASE, AMYLASE in the last 168 hours. No results for input(s): AMMONIA in the last 168 hours. Coagulation Profile: Recent Labs  Lab 04/17/21 0323 04/18/21 0622  INR 1.0 1.1   Cardiac Enzymes: No results for input(s): CKTOTAL, CKMB, CKMBINDEX, TROPONINI in the last 168 hours. BNP (last 3 results) No results for input(s): PROBNP in the last 8760 hours. HbA1C: No results for input(s): HGBA1C in the last 72 hours. CBG: Recent Labs  Lab 04/18/21 0816 04/18/21 1135 04/18/21 1727 04/18/21 2133 04/19/21 0732  GLUCAP 134* 133* 180* 198* 190*   Lipid Profile: No results for input(s): CHOL, HDL, LDLCALC, TRIG, CHOLHDL, LDLDIRECT in the last 72 hours. Thyroid Function Tests: No results for input(s): TSH, T4TOTAL, FREET4, T3FREE, THYROIDAB in the last 72 hours. Anemia Panel: No results for input(s): VITAMINB12, FOLATE, FERRITIN, TIBC, IRON, RETICCTPCT in the last 72 hours. Sepsis Labs: Recent Labs  Lab 04/17/21 0323 04/18/21 0558  PROCALCITON  --  <0.10  LATICACIDVEN 0.7  --     Recent Results (from the past 240 hour(s))  Culture, blood (Routine x 2)     Status: None (Preliminary result)   Collection Time: 04/17/21  3:23 AM   Specimen: BLOOD  Result Value Ref Range Status   Specimen Description   Final    BLOOD SPECIMEN SOURCE NOT MARKED ON REQUISITION Performed at [facility] [address], [place], [place] [address]    Special Requests   Final    BOTTLES DRAWN AEROBIC AND ANAEROBIC Blood Culture adequate volume Performed at [facility] [address] [place], [place] [address]    Culture   Final    NO GROWTH 2 DAYS Performed at [facility] [address], [place], [place]  [address]    Report Status PENDING  Incomplete  SARS CORONAVIRUS 2 (TAT 6-24 HRS) Nasopharyngeal Nasopharyngeal Swab     Status: None   Collection Time: 04/17/21  5:42 AM   Specimen: Nasopharyngeal Swab  Result Value Ref Range Status   SARS Coronavirus 2 NEGATIVE NEGATIVE Final    Comment: (NOTE) SARS-CoV-2 target nucleic acids are NOT DETECTED.  The SARS-CoV-2 RNA is generally detectable in upper and lower respiratory specimens during the acute phase of infection. Negative results do not preclude SARS-CoV-2 infection, do not rule out co-infections with other pathogens, and should not be used as the sole basis for treatment or other patient management decisions. Negative results must be combined with clinical observations, patient history, and epidemiological information. The expected result is Negative.  Fact Sheet for Patients: SugarRoll.be  Fact Sheet for Healthcare Providers: https://www.woods-mathews.com/  This test is not yet approved or cleared by the [place] FDA and  has been authorized for detection and/or diagnosis of SARS-CoV-2 by FDA under an Emergency Use Authorization (EUA). This EUA will remain  in effect (meaning this test can be used) for the duration of the COVID-19 declaration under Se ction 564(b)(1) of the Act, 21 U.S.C. section 360bbb-3(b)(1), unless the authorization is terminated or revoked sooner.  Performed at [facility], [place] [address], [place], [place] [address]   Resp Panel by RT-PCR (Flu A&B, Covid) Nasopharyngeal Swab     Status: None   Collection Time: 04/17/21  8:54 AM   Specimen: Nasopharyngeal Swab; Nasopharyngeal(NP) swabs in vial transport medium  Result Value Ref Range Status   SARS Coronavirus 2 by RT PCR NEGATIVE NEGATIVE Final    Comment: (NOTE) SARS-CoV-2 target nucleic acids are NOT DETECTED.  The SARS-CoV-2 RNA is generally detectable in upper respiratory specimens during the  acute phase of infection. The lowest concentration of SARS-CoV-2 viral copies this assay can detect is 138 copies/mL. A negative result does not preclude SARS-Cov-2 infection and should not be used as the sole basis for treatment or other patient management decisions. A negative result may occur with  improper specimen collection/handling, submission of specimen other than nasopharyngeal swab, presence of viral mutation(s) within the areas targeted by this assay, and inadequate number of viral copies(<138 copies/mL). A negative result must be combined with clinical observations, patient history, and epidemiological information. The expected result is Negative.  Fact Sheet for Patients:  EntrepreneurPulse.com.au  Fact Sheet for Healthcare Providers:  IncredibleEmployment.be  This test is no t yet approved or cleared by the [place] FDA and  has been authorized for detection and/or diagnosis of SARS-CoV-2 by FDA under an Emergency Use Authorization (EUA). This EUA will remain  in effect (meaning this test can be used) for the duration of the COVID-19 declaration under Section 564(b)(1) of the Act, 21 U.S.C.section 360bbb-3(b)(1), unless the authorization is terminated  or revoked sooner.       Influenza A by PCR NEGATIVE NEGATIVE Final   Influenza B by PCR NEGATIVE NEGATIVE Final    Comment: (NOTE) The Xpert Xpress SARS-CoV-2/FLU/RSV plus assay is intended as an aid in the diagnosis of influenza from Nasopharyngeal swab specimens and should not be used as a sole basis for treatment. Nasal washings and aspirates are unacceptable for Xpert Xpress SARS-CoV-2/FLU/RSV testing.  Fact Sheet for Patients: EntrepreneurPulse.com.au  Fact Sheet for Healthcare Providers: IncredibleEmployment.be  This test is not yet approved or cleared by the [place] FDA and has been authorized for detection and/or  diagnosis of SARS-CoV-2 by FDA under an Emergency Use Authorization (EUA). This EUA will remain in effect (meaning this test can be used) for the duration of the COVID-19 declaration under Section 564(b)(1) of the Act, 21 U.S.C. section 360bbb-3(b)(1), unless the authorization is terminated or revoked.  Performed at [facility], [place] [address], [place], [place] [address]   MRSA Next Gen by PCR,  Nasal     Status: None   Collection Time: 04/17/21  1:16 PM   Specimen: Nasal Mucosa; Nasal Swab  Result Value Ref Range Status   MRSA by PCR Next Gen NOT DETECTED NOT DETECTED Final    Comment: (NOTE) The GeneXpert MRSA Assay (FDA approved for NASAL specimens only), is one component of a comprehensive MRSA colonization surveillance program. It is not intended to diagnose MRSA infection nor to guide or monitor treatment for MRSA infections. Test performance is not FDA approved in patients less than 2 years old. Performed at [facility], [place] [address], [place], [place] [address]          Radiology Studies: MR Lumbar Spine W Wo Contrast  Result Date: 04/18/2021 CLINICAL DATA:  Initial evaluation for cord compression, severe lower back pain, history of lung cancer. EXAM: MRI LUMBAR SPINE WITHOUT AND WITH CONTRAST TECHNIQUE: Multiplanar and multiecho pulse sequences of the lumbar spine were obtained without and with intravenous contrast. CONTRAST:  18mL GADAVIST GADOBUTROL 1 MMOL/ML IV SOLN COMPARISON:  CT from 04/09/2021. FINDINGS: Segmentation: Standard. Lowest well-formed disc space labeled the L5-S1 level. Alignment: Trace retrolisthesis of L1 on L2 and L2 on L3. Alignment otherwise normal with preservation of the normal lumbar lordosis. Vertebrae: Abnormal STIR signal intensity with enhancement seen largely replacing the L1 vertebral body, consistent with osseous metastatic disease. Extension into the right pedicle posteriorly noted. There is  associated epidural tumor within the adjacent spinal canal, extending from approximately T12 through L2 (series 17, image 9). Tumor is most pronounced within the ventral and right aspect of the epidural space, with greatest involvement at the level of L1 with there is secondary compression of the thecal sac and severe spinal stenosis (series 8, image 9). Thecal sac narrowed to approximately 5 mm in AP diameter at its most narrow point (series 8, image 11). Suspected subdural and/or intradural involvement with probable tumor seen intimately associated with the distal conus and proximal nerve roots of the cauda equina (series 8, image 14). Lateral extension to involve the neural foramina on the right at T12-L1, bilaterally, but greater on the right at L1-2, and on the right at L2-3. Elsewhere, heterogeneous signal intensity seen throughout the visualized osseous structures. Possible additional metastatic involvement at the posterior/superior aspect of the L2 vertebral body (series 6, image 7). No other definite discrete osseous metastases are seen. Vertebral body height maintained without pathologic fracture. Conus medullaris and cauda equina: Conus extends to the approximately the T12-L1 level. Metastatic and epidural involvement as above. Paraspinal and other soft tissues: Mild diffuse edema and enhancement seen within the posterior paraspinous musculature, nonspecific, but could reflect muscular strain or possibly myositis. No collections. 1.7 cm epidermal inclusion cyst/sebaceous cyst noted at the left upper back. Prominent distension of the partially visualized urinary bladder. Disc levels: T11-12: Seen only on sagittal projection. Disc bulge with disc desiccation. No significant spinal stenosis. Foramina appear patent. T12-L1: Disc bulge with disc desiccation. Circumferential epidural tumor, greatest on the right. Associated flattening and compression of the thecal sac with resultant moderate spinal stenosis.  Tumor extension into the right neural foramen. Left neural foramina remains patent. L1-2: Disc bulge with disc desiccation. Possible superimposed right paracentral disc protrusion. Circumferential epidural tumor compresses the thecal sac with resultant severe spinal stenosis. Thecal sac measures 5 mm in AP diameter at its most narrow point. Tumor extension into the right greater than left neural foramina noted. L2-3: Degenerative intervertebral disc space narrowing with diffuse disc bulge, eccentric  to the left. Bilateral facet hypertrophy. No significant spinal stenosis. Mild left foraminal narrowing. Right neural foramina remains patent. L3-4: Disc bulge with disc desiccation. Reactive endplate spurring. Mild bilateral facet hypertrophy. Resultant mild narrowing of the left lateral recess. Central canal remains patent. Mild right L3 foraminal stenosis. Left neural foramina remains patent. L4-5: Advanced degenerative intervertebral disc space narrowing with diffuse disc bulge and reactive endplate spurring. Mild to moderate bilateral facet hypertrophy. No more than mild spinal stenosis. Mild bilateral L4 foraminal narrowing. No frank impingement. L5-S1: Degenerative intervertebral disc space narrowing with disc desiccation and reactive endplate change. Superimposed central disc protrusion indents the ventral thecal sac. Mild facet hypertrophy. No significant spinal stenosis. Mild bilateral L5 foraminal narrowing. IMPRESSION: 1. 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 Resultant severe spinal stenosis, most pronounced at the level of L1-2. Probable subdural and/or intradural involvement with tumor seen intimately associated with the distal conus and proximal nerve roots of the cauda equina. Extension into the right greater than left neural foramina at T12-L1 through L2-3 as above. 2. Possible additional metastatic involvement of  the posterior/superior aspect of the L2 vertebral body. No other discrete osseous metastatic disease. No associated pathologic fracture. 3. Prominent distension of the partially visualized urinary bladder. Clinical correlation for possible urinary retention recommended. 4. Diffuse edema and enhancement within the posterior paraspinous musculature, nonspecific, but could reflect muscular strain or possibly myositis. No discrete collections. 5. Underlying multilevel degenerative spondylosis as detailed above. Current attempt is being made to contact the covering provider at 10:39 p.m. on 04/18/2021, currently awaiting a call back. Results will be conveyed as soon as possible. Electronically Signed   By: Jeannine Boga M.D.   On: 04/18/2021 23:07   MR SACRUM SI JOINTS W WO CONTRAST  Result Date: 04/19/2021 CLINICAL DATA:  Low back pain and sacral pain EXAM: MRI SACRUM WITHOUT CONTRAST TECHNIQUE: Multiplanar multi-sequence MR imaging of the sacrum was performed. No intravenous contrast was administered. COMPARISON:  04/09/2021 CT scan FINDINGS: Osseous structures: The sacroiliac joints appear unremarkable. No findings of metastatic disease to the sacrum. Red marrow redistribution is present. This is nonspecific can be associated with a variety of conditions including chronic anemia, chronic heart failure, myelofibrosis, infiltrative marrow disease, response to infection, and other conditions. There is evidence of lower lumbar spondylosis and degenerative disc disease. Musculotendinous: Unremarkable where included. Sacral plexus and sciatic nerves: No impinging lesion, abnormal edema, or abnormal enhancement along the sacral plexus or visualized sciatic nerves. Other: Urinary bladder mildly distended, significance uncertain. And visualized bowel appear unremarkable. IMPRESSION: 1. Lower lumbar spondylosis and degenerative disc disease, please see dedicated lumbar spine MRI report. 2. Mild distention of the  urinary bladder. Electronically Signed   By: Van Clines M.D.   On: 04/19/2021 05:31   [place] EKG SITE RITE  Result Date: 04/19/2021 If Site Rite image not attached, placement could not be confirmed due to current cardiac rhythm.       Scheduled Meds:  vitamin C  1,000 mg Oral Daily   celecoxib  400 mg Oral BID   Chlorhexidine Gluconate Cloth  6 each Topical Daily   dexamethasone (DECADRON) injection  8 mg Intravenous Q12H   diazepam  5 mg Oral TID   [START ON 04/20/2021] fentaNYL  1 patch Transdermal Q72H   gabapentin  300 mg Oral BID WC   And   gabapentin  600 mg Oral QHS   ipratropium-albuterol  3 mL Nebulization  BID   labetalol  50 mg Oral BID   latanoprost  1 drop Both Eyes QHS   mouth rinse  15 mL Mouth Rinse BID   methadone  10 mg Oral Q12H   pantoprazole  40 mg Oral Daily   sertraline  100 mg Oral Daily   Continuous Infusions:  sodium chloride 10 mL/hr at 04/19/21 0900   ceFEPime (MAXIPIME) IV Stopped (04/19/21 0604)   fentaNYL infusion INTRAVENOUS Stopped (04/19/21 0945)   ketamine (KETALAR) Adult IV Infusion 0.5 mg/kg/hr (04/19/21 0900)     LOS: 2 days    Time spent: 30 minutes    Barb Merino, MD [facility] Pager [phone]

## 2021-04-19 NOTE — Progress Notes (Signed)
NAME:  Christopher Carter, MRN:  [id number], DOB:  Jun 16, 1961, LOS: 2 ADMISSION DATE:  04/17/2021, CONSULTATION DATE:  04/17/21 REFERRING MD:  dr Marylyn Ishihara, CHIEF COMPLAINT:  Sob, weakness, fever   History of Present Illness:  60 y/o M, never smoker, who presented to [facility] on 8/9 with reports of fever.    The patient was referred to Dr. Julien Nordmann in April 2022 with he was found in March to have a large right apical opacity on imaging after ongoing cough from COVID.  At that time the CT showed he had a RUL mass that was 7.5 x 6.4. 6.4 cm abutting the pleural surface without definite chest wall invasion. PET scan 12/13/20 confirmed markedly hypermetabolic RUL mass consistent with primary bronchogenic neoplasm.  MRI Brain was negative for metastatic disease. On 01/05/21 he underwent FOB with EBUS with pathology consistent with non-small cell lung cancer.  He was referred to CVTS in early May 2022 & was admitted for VATS with RUL lobectomy 01/22/2021 with positive margin posteriorly near the spine.  Post operative course was complicated by significant pain in chest wall & back.  Additionally, he had a fluid collection in the RUL.  He was admitted from 6/19-6/29 with hemoptysis and pain.  He was treated for possible pulmonary infection.  He returned to the hospital from 7/10-04/10/21 with ongoing hypoxic respiratory failure, uncontrolled pain, & tooth abscess.  During that admission he had a repeat FOB on 7/11 which showed no active bleeding.  He had CT imaging which showed new gas in the the known complex right apical fluid collection and this was drained via chest tube per IR (chest tube removed 04/09/21). He started radiotherapy for the bronchial and pleural margins and is expected to complete this 05/07/2021.   He returns to the ER at [facility] on 8/9 with reports of shortness of breath, weakness and fever. He was reportedly doing well after his most recent discharge but woke the am of 8/9 with drenching sweats, mild confusion and  difficulty concentrating. He reported ongoing low back pain since admit.  A CT of the chest with contrast was assessed which showed a RUL lobectomy with known complex right apical collection with mixed density that was smaller in size, waxing and waning appearance of other findings to include scattered ground glass opacities in the left lung with some increase in nodularity in the LLL, increase in size of 2.5 cm in  diameter complex region medially along the the left pleural effusion, stable subacute right fifth rib fracture with screw tracks in the right 5th/6th ribs. He was admitted per [facility] for further evaluation of febrile illness.  Blood cultures were obtained. COVID / flu testing negative. Empiric antibiotics were initiated - cefepime + vancomycin.   Due to ongoing pain, palliative care recommended Ketamine infusion; therefore, PCCM asked to see to assist with care in SDU.  No signs of ongoing infection  MRI of the spine obtain showing extensive metastatic disease with possible cord compromise  Pertinent  Medical History   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   Anxiety    Depression    Hypertension    Pneumonia    1990's     Significant Hospital Events: Including procedures, antibiotic start and stop dates in addition to other pertinent events   8/9 admitted with fever, weakness, shortness of breath  Interim History / Subjective:  Lower extremity discomfort High doses of opiates  Objective   Blood pressure (!) 174/92,  pulse (!) 109, temperature 98.6 F (37 C), temperature source Oral, resp. rate 12, height 5\' 10"  (1.778 m), weight 80.1 kg, SpO2 98 %.    FiO2 (%):  [28 %] 28 %   Intake/Output Summary (Last 24 hours) at 04/19/2021 1213 Last data filed at 04/19/2021 0900 Gross per 24 hour  Intake 886.4 ml  Output 1500 ml  Net -613.6 ml   Filed Weights   04/17/21 0241 04/17/21 1210  Weight: 80.7 kg 80.1 kg    Examination: General: Chronically  ill-appearing, resting HENT: Moist oral mucosa Lungs: Diminished breath sounds on the right Cardiovascular: S1-S2 appreciated Abdomen: Soft, bowel sounds appreciated Extremities: Warm and dry Neuro: Sleepy GU:   Resolved Hospital Problem list     Assessment & Plan:  Extensive metastatic disease on MRI of his lower back -Radiotherapy for palliative pain management -Discussions ongoing regarding goals of care  Concern for sepsis with fever/weakness -Markers of inflammation negative -Cultures negative -We will stop antibiotics  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

## 2021-04-20 ENCOUNTER — Ambulatory Visit
Admission: RE | Admit: 2021-04-20 | Discharge: 2021-04-20 | Disposition: A | Discharge status: Home / Self Care | Payer: 59 | Source: Ambulatory Visit | Attending: Radiation Oncology | Admitting: Radiation Oncology

## 2021-04-20 ENCOUNTER — Inpatient Hospital Stay: Payer: 59 | Admitting: Primary Care

## 2021-04-20 MED ORDER — AMOXICILLIN-POT CLAVULANATE 875-125 MG PO TABS
1.0000 | ORAL_TABLET | Freq: Two times a day (BID) | ORAL | Status: DC
  Administered 2021-04-21 – 2021-04-25 (×9): 1 via ORAL
  Filled 2021-04-20 (×10): qty 1

## 2021-04-20 MED ORDER — METHOCARBAMOL 1000 MG/10ML IJ SOLN
500.0000 mg | Freq: Four times a day (QID) | INTRAVENOUS | Status: DC | PRN
  Filled 2021-04-20: qty 5

## 2021-04-20 MED ORDER — KETOROLAC TROMETHAMINE 30 MG/ML IJ SOLN
30.0000 mg | Freq: Four times a day (QID) | INTRAMUSCULAR | Status: DC | PRN
  Administered 2021-04-20 – 2021-04-21 (×2): 30 mg via INTRAVENOUS
  Filled 2021-04-20 (×2): qty 1

## 2021-04-20 MED ORDER — MAGIC MOUTHWASH
10.0000 mL | Freq: Three times a day (TID) | ORAL | Status: DC
  Administered 2021-04-21 – 2021-04-25 (×11): 10 mL via ORAL
  Filled 2021-04-20 (×11): qty 10

## 2021-04-20 MED ORDER — METHADONE HCL 10 MG PO TABS
20.0000 mg | ORAL_TABLET | Freq: Two times a day (BID) | ORAL | Status: DC
Start: 2021-04-20 — End: 2021-04-24
  Administered 2021-04-21 – 2021-04-24 (×7): 20 mg via ORAL
  Filled 2021-04-20 (×9): qty 2

## 2021-04-20 MED ORDER — KETOROLAC TROMETHAMINE 30 MG/ML IJ SOLN
30.0000 mg | Freq: Once | INTRAMUSCULAR | Status: AC
  Administered 2021-04-20: 30 mg via INTRAVENOUS
  Filled 2021-04-20: qty 1

## 2021-04-20 MED ORDER — SENNOSIDES-DOCUSATE SODIUM 8.6-50 MG PO TABS
2.0000 | ORAL_TABLET | Freq: Two times a day (BID) | ORAL | Status: DC
  Administered 2021-04-20 – 2021-04-23 (×6): 2 via ORAL
  Filled 2021-04-20 (×7): qty 2

## 2021-04-20 MED ORDER — LABETALOL HCL 100 MG PO TABS
100.0000 mg | ORAL_TABLET | Freq: Two times a day (BID) | ORAL | Status: DC
  Administered 2021-04-21 – 2021-04-24 (×7): 100 mg via ORAL
  Filled 2021-04-20 (×8): qty 1

## 2021-04-20 MED ORDER — METHADONE HCL 10 MG PO TABS
10.0000 mg | ORAL_TABLET | Freq: Once | ORAL | Status: AC
Start: 2021-04-20 — End: 2021-04-20
  Administered 2021-04-20: 10 mg via ORAL
  Filled 2021-04-20: qty 1

## 2021-04-20 NOTE — Progress Notes (Signed)
Palliative Care Progress Note   Non-Small Cell Lung Cancer, currently receiving definitive XRT to Right Chest, s/p lobetomy for IIIa disease at diagnosis, now Stage IV. 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 Paraneoplastic myositis- inflammed proximal muscles on MRI espcially in the hip pelvic girdle region, he also has a mildly vasculitic rash on his upper chest and extremities. 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 This morning he has a pain crisis after being cleaned up and repositioned in bed that required back to back boluses of fentanyl and valium which eventually controlled his pain and sedated him. He remains on 0.5mg  of ketamine.  Sedated on his visit- [name] and I discussed issues related to palliative sedation and trying to improve his cognitive and mental status. At this time he is fairly altered and has very poor short term memory-likely from the ketamine.  While at the bedside I held his ketamine infusion and reduced it to 0.1 mg/kg dose. He became more awake and was starting be much more coherent. Christopher Carter had not been able to retain information about his condition-and his wife was concerned that she kept having to tell him what was going on in his lower back. I re-explained details to Christopher Carter-and the goals for radiation and pain control as well as recommendation for hospice care.  Pain Control is the primary goal of care for him at this time with a plan to discharge home with hospice if we can get his pain under reasonable control- at this time I am hopeful that we will not need palliative sedation but I did discuss intractable pain and options for his end of life care should things get worse for  him. Titration orders give for Fentanyl Infusion up to 439mcg/hr-we will maximize transdermal fentanyl depending of his total requirements. Increased his methadone this AM to 20mg  BID He has radiation this afternoon-he is very worried about getting through the treatment because of pain with positioning and movement-I discussed a plan with RN to provide pre-medication and also premed for vomiting since he vomited yesterday after his tx. Will leave ketamine at 0.5mg /kg/hr for radiation and reduce to 0.1mg ./kg/hr afterwards. Plan to stop ketamine tomorrow AM if his pain is improved. Continue high dose decadron 8mg  BID Toradol 30mg  q6 prn and scheduled Celebrex for inflammation-on GI protection His wife asked me about his tooth infection and whether or not that could be a source of his fever-it would be reasonable to leave him on an oral antibiotic for this indefinitely to prevent pain. Restart Augmentin 875/125 BID. Would be high risk for bisphosphonate given his dental issues. Started Senna-S bowel regimen + Miralax PRN Continue both scheduled and PRN Valium for anxiety and muscle spasm, orders also placed for IV Robaxin for muscle spasms.  Will hopefully be able to begin discharge planning on Monday and place referral to [place]. He will likely need to discharge home with a fentanyl PCA-via CADD pump through his PICC line. Will place TOC order and get this process started. Planned radiation through Wednesday as long as he is showing improvement in pain and tolerating the tx-will plan to discharge on Wed following his last treatment.  Lane Hacker, DO Palliative Medicine   Time: 35 min, greater than 50%  of this time was spent counseling and coordinating care related to the above assessment and plan + 60 minutes of additional time at bedside providing direct patient care including infusion management and pain assessment.

## 2021-04-20 NOTE — Progress Notes (Signed)
PROGRESS NOTE    Christopher Carter  RSW:[id number] DOB: May 20, 1961 DOA: 04/17/2021 PCP: [name], MD    Brief Narrative:  60 year old gentleman with history of adenocarcinoma of the lung, hypertension, anxiety and depression presented to the emergency room with fever and chills.  Recently diagnosed with lung cancer status post resection, multiple complications including right apical abscess treated with chest tube drainage, pain control challenges, treated with radiation and discharged home after complicated hospitalizations on 8/2. Brought back to the hospital with severe unrelenting pain, fever and altered mental status. In the emergency room afebrile.  CTA PE showed slight improvement on fluid collection seen previously, scattered groundglass opacities, left base pleural tumor with possible superadded infection.  Started on broad-spectrum antibiotics and admitted to the hospital. Admitted to stepdown unit because he started on ketamine infusion for pain relief.  Also followed by pulmonary.  Subsequent MRI spines consistent with very aggressive spread of cancer into spine with cord compression, myositis. Urinary retention. 8/11 hospice discussion initiated.  Patient remains in the hospital with severe pain on multiple infusions of medications including ketamine and fentanyl.  Managed by palliative care.   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  Received antibiotics for 3 days.  Discontinued.  Likely source of fever is tumor.  Symptomatic treatment.  Cultures negative so far.  See goal of care discussion below.   Poorly differentiated adenocarcinoma of the lung: recurrence , aggressive spread into  spine, widespread bone mets, cauda equina and cord compression: metastatic cancer pain. Patient was treated with ketamine infusion and fentanyl. Was found with severe back pain, neurological deficits, spasms and urinary retention. MRI thoracic and lumbar spine 8/10 with widespread metastatic disease, dural compression and cord compression. Still with severe unrelenting pain. 8/11, bedside goal of care discussion with palliative care Dr. Hilma Favors with patient and patient's wife.  Focuses on pain control.  Unlikely he will tolerate any cancer therapies.  He will not tolerate any neurosurgical intervention and this may not prove any helpful for survival.  -Titrating up ketamine, increasing dose of fentanyl infusion today.  Received PICC line.   -On additional fentanyl patch and boluses through IV.   -Scheduled dose of benzodiazepine.   -Received high-dose radiation on his back 8/11.   -DNR/DNI.   -Other symptomatic treatment including laxatives, oxygen, Foley catheter.   -Continue Zoloft.   -On methadone 10 mg twice a day, increasing dose to 20 mg twice a day today.  Plan is to transition to home if can achieve pain management.  If no good control over the pain, may need to go to inpatient hospice.  DVT prophylaxis: Place and maintain sequential compression device Start: 04/17/21 1202 SCDs Start: 04/17/21 1020   Code Status: DNR Family Communication: None today. Disposition Plan: Status is: Inpatient  Remains inpatient appropriate because:Ongoing active pain requiring inpatient pain management and Inpatient level of care appropriate due to severity of illness  Dispo: The patient is from: Home              Anticipated d/c is to: Home with possibly home hospice              Patient currently is not medically  stable to d/c.   Difficult to place patient No         Consultants:  Palliative Pulmonary Oncology Radiation oncology  Procedures:  None  Antimicrobials:  Antibiotics  Given (last 72 hours)     Date/Time Action Medication Dose Rate   04/17/21 1354 New Bag/Given   ceFEPIme (MAXIPIME) 2 g in sodium chloride 0.9 % 100 mL IVPB 2 g 200 mL/hr   04/17/21 1559 New Bag/Given   vancomycin (VANCOREADY) IVPB 1250 mg/250 mL 1,250 mg 166.7 mL/hr   04/17/21 2113 New Bag/Given   ceFEPIme (MAXIPIME) 2 g in sodium chloride 0.9 % 100 mL IVPB 2 g 200 mL/hr   04/17/21 2209 New Bag/Given   metroNIDAZOLE (FLAGYL) IVPB 500 mg 500 mg 100 mL/hr   04/18/21 0341 New Bag/Given   vancomycin (VANCOREADY) IVPB 1250 mg/250 mL 1,250 mg 166.7 mL/hr   04/18/21 0529 New Bag/Given   ceFEPIme (MAXIPIME) 2 g in sodium chloride 0.9 % 100 mL IVPB 2 g 200 mL/hr   04/18/21 1312 New Bag/Given   ceFEPIme (MAXIPIME) 2 g in sodium chloride 0.9 % 100 mL IVPB 2 g 200 mL/hr   04/18/21 2211 New Bag/Given   ceFEPIme (MAXIPIME) 2 g in sodium chloride 0.9 % 100 mL IVPB 2 g 200 mL/hr   04/19/21 0534 New Bag/Given   ceFEPIme (MAXIPIME) 2 g in sodium chloride 0.9 % 100 mL IVPB 2 g 200 mL/hr          Subjective: I saw patient in the morning at about 8:00.  He was moaning in pain, uncomfortable.  We discussed with palliative care team and increased dose of fentanyl. Patient was not able to keep up meaningful conversation.  He just looked confused and anxious.  Objective: Vitals:   04/20/21 0700 04/20/21 0733 04/20/21 0742 04/20/21 0900  BP: (!) 163/94   (!) 193/103  Pulse: 92   (!) 108  Resp: 10   15  Temp:  97.8 F (36.6 C)    TempSrc:  Axillary    SpO2: 98%  99% 94%  Weight:      Height:        Intake/Output Summary (Last 24 hours) at 04/20/2021 1250 Last data filed at 04/20/2021 0705 Gross per 24 hour  Intake 484.5 ml  Output 3100 ml  Net -2615.5 ml   Filed Weights   04/17/21 0241 04/17/21 1210  Weight: 80.7 kg 80.1 kg    Examination:  General exam: Frail and debilitated.  Sick looking.  Slightly disoriented, confused and anxious. Respiratory system: No added sounds.  Some  conducted airway sounds.   Cardiovascular system: S1 & S2 heard, RRR.  Tachycardic.   Gastrointestinal system: Soft and nontender.  Bowel sounds present. Central nervous system: Patient is alert and awake.  He is oriented x1-2.  has some delirium with ongoing pain and medications. He can move his upper extremities. Complains of a spasm on attempted moving lower extremities.  Wants to leave it alone.     Data Reviewed: I have personally reviewed following labs and imaging studies  CBC: Recent Labs  Lab 04/17/21 0323 04/18/21 0305  WBC 7.7 6.9  NEUTROABS 6.4  --   HGB 8.9* 9.2*  HCT 28.4* 30.6*  MCV 96.3 96.8  PLT 223 433   Basic Metabolic Panel: Recent Labs  Lab 04/17/21 0323 04/18/21 0305  NA 138 139  K 4.3 4.3  CL 98 102  CO2 30 28  GLUCOSE 158* 110*  BUN 9 11  CREATININE 0.65  0.48*  CALCIUM 9.1 8.8*   GFR: Estimated Creatinine Clearance: 103.9 mL/min (A) (by C-G formula based on SCr of 0.48 mg/dL (L)). Liver Function Tests: Recent Labs  Lab 04/17/21 0323 04/18/21 0305  AST 10* 11*  ALT 12 11  ALKPHOS 71 71  BILITOT 0.7 0.7  PROT 6.2* 5.9*  ALBUMIN 3.1* 2.8*   No results for input(s): LIPASE, AMYLASE in the last 168 hours. No results for input(s): AMMONIA in the last 168 hours. Coagulation Profile: Recent Labs  Lab 04/17/21 0323 04/18/21 0622  INR 1.0 1.1   Cardiac Enzymes: No results for input(s): CKTOTAL, CKMB, CKMBINDEX, TROPONINI in the last 168 hours. BNP (last 3 results) No results for input(s): PROBNP in the last 8760 hours. HbA1C: No results for input(s): HGBA1C in the last 72 hours. CBG: Recent Labs  Lab 04/18/21 0816 04/18/21 1135 04/18/21 1727 04/18/21 2133 04/19/21 0732  GLUCAP 134* 133* 180* 198* 190*   Lipid Profile: No results for input(s): CHOL, HDL, LDLCALC, TRIG, CHOLHDL, LDLDIRECT in the last 72 hours. Thyroid Function Tests: No results for input(s): TSH, T4TOTAL, FREET4, T3FREE, THYROIDAB in the last 72  hours. Anemia Panel: No results for input(s): VITAMINB12, FOLATE, FERRITIN, TIBC, IRON, RETICCTPCT in the last 72 hours. Sepsis Labs: Recent Labs  Lab 04/17/21 0323 04/18/21 0558  PROCALCITON  --  <0.10  LATICACIDVEN 0.7  --     Recent Results (from the past 240 hour(s))  Culture, blood (Routine x 2)     Status: None (Preliminary result)   Collection Time: 04/17/21  3:23 AM   Specimen: BLOOD  Result Value Ref Range Status   Specimen Description   Final    BLOOD SPECIMEN SOURCE NOT MARKED ON REQUISITION Performed at [facility] [address] [place], [place] [address]    Special Requests   Final    BOTTLES DRAWN AEROBIC AND ANAEROBIC Blood Culture adequate volume Performed at [facility] [address] [place], [place] [address]    Culture   Final    NO GROWTH 2 DAYS Performed at [facility] [address], [place], [place] [address]    Report Status PENDING  Incomplete  SARS CORONAVIRUS 2 (TAT 6-24 HRS) Nasopharyngeal Nasopharyngeal Swab     Status: None   Collection Time: 04/17/21  5:42 AM   Specimen: Nasopharyngeal Swab  Result Value Ref Range Status   SARS Coronavirus 2 NEGATIVE NEGATIVE Final    Comment: (NOTE) SARS-CoV-2 target nucleic acids are NOT DETECTED.  The SARS-CoV-2 RNA is generally detectable in upper and lower respiratory specimens during the acute phase of infection. Negative results do not preclude SARS-CoV-2 infection, do not rule out co-infections with other pathogens, and should not be used as the sole basis for treatment or other patient management decisions. Negative results must be combined with clinical observations, patient history, and epidemiological information. The expected result is Negative.  Fact Sheet for Patients: SugarRoll.be  Fact Sheet for Healthcare Providers: https://www.woods-mathews.com/  This test is not yet approved or cleared by  the [place] FDA and  has been authorized for detection and/or diagnosis of SARS-CoV-2 by FDA under an Emergency Use Authorization (EUA). This EUA will remain  in effect (meaning this test can be used) for the duration of the COVID-19 declaration under Se ction 564(b)(1) of the Act, 21 U.S.C. section 360bbb-3(b)(1), unless the authorization is terminated or revoked sooner.  Performed at [facility], [place] [address] [place], [place] [address]  Resp Panel by RT-PCR (Flu A&B, Covid) Nasopharyngeal Swab     Status: None   Collection Time: 04/17/21  8:54 AM   Specimen: Nasopharyngeal Swab; Nasopharyngeal(NP) swabs in vial transport medium  Result Value Ref Range Status   SARS Coronavirus 2 by RT PCR NEGATIVE NEGATIVE Final    Comment: (NOTE) SARS-CoV-2 target nucleic acids are NOT DETECTED.  The SARS-CoV-2 RNA is generally detectable in upper respiratory specimens during the acute phase of infection. The lowest concentration of SARS-CoV-2 viral copies this assay can detect is 138 copies/mL. A negative result does not preclude SARS-Cov-2 infection and should not be used as the sole basis for treatment or other patient management decisions. A negative result may occur with  improper specimen collection/handling, submission of specimen other than nasopharyngeal swab, presence of viral mutation(s) within the areas targeted by this assay, and inadequate number of viral copies(<138 copies/mL). A negative result must be combined with clinical observations, patient history, and epidemiological information. The expected result is Negative.  Fact Sheet for Patients:  EntrepreneurPulse.com.au  Fact Sheet for Healthcare Providers:  IncredibleEmployment.be  This test is no t yet approved or cleared by the [place] FDA and  has been authorized for detection and/or diagnosis of SARS-CoV-2 by FDA under an Emergency Use Authorization (EUA). This  EUA will remain  in effect (meaning this test can be used) for the duration of the COVID-19 declaration under Section 564(b)(1) of the Act, 21 U.S.C.section 360bbb-3(b)(1), unless the authorization is terminated  or revoked sooner.       Influenza A by PCR NEGATIVE NEGATIVE Final   Influenza B by PCR NEGATIVE NEGATIVE Final    Comment: (NOTE) The Xpert Xpress SARS-CoV-2/FLU/RSV plus assay is intended as an aid in the diagnosis of influenza from Nasopharyngeal swab specimens and should not be used as a sole basis for treatment. Nasal washings and aspirates are unacceptable for Xpert Xpress SARS-CoV-2/FLU/RSV testing.  Fact Sheet for Patients: EntrepreneurPulse.com.au  Fact Sheet for Healthcare Providers: IncredibleEmployment.be  This test is not yet approved or cleared by the [place] FDA and has been authorized for detection and/or diagnosis of SARS-CoV-2 by FDA under an Emergency Use Authorization (EUA). This EUA will remain in effect (meaning this test can be used) for the duration of the COVID-19 declaration under Section 564(b)(1) of the Act, 21 U.S.C. section 360bbb-3(b)(1), unless the authorization is terminated or revoked.  Performed at [facility] [place] [address], [place], [place] [address]   MRSA Next Gen by PCR, Nasal     Status: None   Collection Time: 04/17/21  1:16 PM   Specimen: Nasal Mucosa; Nasal Swab  Result Value Ref Range Status   MRSA by PCR Next Gen NOT DETECTED NOT DETECTED Final    Comment: (NOTE) The GeneXpert MRSA Assay (FDA approved for NASAL specimens only), is one component of a comprehensive MRSA colonization surveillance program. It is not intended to diagnose MRSA infection nor to guide or monitor treatment for MRSA infections. Test performance is not FDA approved in patients less than 11 years old. Performed at [facility], [place] [address], [place], [place]  [address]          Radiology Studies: MR Lumbar Spine W Wo Contrast  Result Date: 04/18/2021 CLINICAL DATA:  Initial evaluation for cord compression, severe lower back pain, history of lung cancer. EXAM: MRI LUMBAR SPINE WITHOUT AND WITH CONTRAST TECHNIQUE: Multiplanar and multiecho pulse sequences of the lumbar spine were obtained without and with  intravenous contrast. CONTRAST:  13mL GADAVIST GADOBUTROL 1 MMOL/ML IV SOLN COMPARISON:  CT from 04/09/2021. FINDINGS: Segmentation: Standard. Lowest well-formed disc space labeled the L5-S1 level. Alignment: Trace retrolisthesis of L1 on L2 and L2 on L3. Alignment otherwise normal with preservation of the normal lumbar lordosis. Vertebrae: Abnormal STIR signal intensity with enhancement seen largely replacing the L1 vertebral body, consistent with osseous metastatic disease. Extension into the right pedicle posteriorly noted. There is associated epidural tumor within the adjacent spinal canal, extending from approximately T12 through L2 (series 17, image 9). Tumor is most pronounced within the ventral and right aspect of the epidural space, with greatest involvement at the level of L1 with there is secondary compression of the thecal sac and severe spinal stenosis (series 8, image 9). Thecal sac narrowed to approximately 5 mm in AP diameter at its most narrow point (series 8, image 11). Suspected subdural and/or intradural involvement with probable tumor seen intimately associated with the distal conus and proximal nerve roots of the cauda equina (series 8, image 14). Lateral extension to involve the neural foramina on the right at T12-L1, bilaterally, but greater on the right at L1-2, and on the right at L2-3. Elsewhere, heterogeneous signal intensity seen throughout the visualized osseous structures. Possible additional metastatic involvement at the posterior/superior aspect of the L2 vertebral body (series 6, image 7). No other definite discrete osseous  metastases are seen. Vertebral body height maintained without pathologic fracture. Conus medullaris and cauda equina: Conus extends to the approximately the T12-L1 level. Metastatic and epidural involvement as above. Paraspinal and other soft tissues: Mild diffuse edema and enhancement seen within the posterior paraspinous musculature, nonspecific, but could reflect muscular strain or possibly myositis. No collections. 1.7 cm epidermal inclusion cyst/sebaceous cyst noted at the left upper back. Prominent distension of the partially visualized urinary bladder. Disc levels: T11-12: Seen only on sagittal projection. Disc bulge with disc desiccation. No significant spinal stenosis. Foramina appear patent. T12-L1: Disc bulge with disc desiccation. Circumferential epidural tumor, greatest on the right. Associated flattening and compression of the thecal sac with resultant moderate spinal stenosis. Tumor extension into the right neural foramen. Left neural foramina remains patent. L1-2: Disc bulge with disc desiccation. Possible superimposed right paracentral disc protrusion. Circumferential epidural tumor compresses the thecal sac with resultant severe spinal stenosis. Thecal sac measures 5 mm in AP diameter at its most narrow point. Tumor extension into the right greater than left neural foramina noted. L2-3: Degenerative intervertebral disc space narrowing with diffuse disc bulge, eccentric to the left. Bilateral facet hypertrophy. No significant spinal stenosis. Mild left foraminal narrowing. Right neural foramina remains patent. L3-4: Disc bulge with disc desiccation. Reactive endplate spurring. Mild bilateral facet hypertrophy. Resultant mild narrowing of the left lateral recess. Central canal remains patent. Mild right L3 foraminal stenosis. Left neural foramina remains patent. L4-5: Advanced degenerative intervertebral disc space narrowing with diffuse disc bulge and reactive endplate spurring. Mild to moderate  bilateral facet hypertrophy. No more than mild spinal stenosis. Mild bilateral L4 foraminal narrowing. No frank impingement. L5-S1: Degenerative intervertebral disc space narrowing with disc desiccation and reactive endplate change. Superimposed central disc protrusion indents the ventral thecal sac. Mild facet hypertrophy. No significant spinal stenosis. Mild bilateral L5 foraminal narrowing. IMPRESSION: 1. 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 Resultant severe spinal stenosis, most pronounced at the level of L1-2. Probable subdural and/or intradural involvement with tumor seen intimately associated with the distal conus and  proximal nerve roots of the cauda equina. Extension into the right greater than left neural foramina at T12-L1 through L2-3 as above. 2. Possible additional metastatic involvement of the posterior/superior aspect of the L2 vertebral body. No other discrete osseous metastatic disease. No associated pathologic fracture. 3. Prominent distension of the partially visualized urinary bladder. Clinical correlation for possible urinary retention recommended. 4. Diffuse edema and enhancement within the posterior paraspinous musculature, nonspecific, but could reflect muscular strain or possibly myositis. No discrete collections. 5. Underlying multilevel degenerative spondylosis as detailed above. Current attempt is being made to contact the covering provider at 10:39 p.m. on 04/18/2021, currently awaiting a call back. Results will be conveyed as soon as possible. Electronically Signed   By: Jeannine Boga M.D.   On: 04/18/2021 23:07   MR SACRUM SI JOINTS W WO CONTRAST  Result Date: 04/19/2021 CLINICAL DATA:  Low back pain and sacral pain EXAM: MRI SACRUM WITHOUT CONTRAST TECHNIQUE: Multiplanar multi-sequence MR imaging of the sacrum was performed. No intravenous contrast was administered. COMPARISON:   04/09/2021 CT scan FINDINGS: Osseous structures: The sacroiliac joints appear unremarkable. No findings of metastatic disease to the sacrum. Red marrow redistribution is present. This is nonspecific can be associated with a variety of conditions including chronic anemia, chronic heart failure, myelofibrosis, infiltrative marrow disease, response to infection, and other conditions. There is evidence of lower lumbar spondylosis and degenerative disc disease. Musculotendinous: Unremarkable where included. Sacral plexus and sciatic nerves: No impinging lesion, abnormal edema, or abnormal enhancement along the sacral plexus or visualized sciatic nerves. Other: Urinary bladder mildly distended, significance uncertain. And visualized bowel appear unremarkable. IMPRESSION: 1. Lower lumbar spondylosis and degenerative disc disease, please see dedicated lumbar spine MRI report. 2. Mild distention of the urinary bladder. Electronically Signed   By: Van Clines M.D.   On: 04/19/2021 05:31   [place] EKG SITE RITE  Result Date: 04/19/2021 If Site Rite image not attached, placement could not be confirmed due to current cardiac rhythm.       Scheduled Meds:  vitamin C  1,000 mg Oral Daily   celecoxib  400 mg Oral BID   Chlorhexidine Gluconate Cloth  6 each Topical Daily   dexamethasone (DECADRON) injection  8 mg Intravenous Q12H   diazepam  5 mg Oral TID   fentaNYL  1 patch Transdermal Q72H   gabapentin  300 mg Oral BID WC   And   gabapentin  600 mg Oral QHS   ipratropium-albuterol  3 mL Nebulization BID   labetalol  50 mg Oral BID   latanoprost  1 drop Both Eyes QHS   mouth rinse  15 mL Mouth Rinse BID   methadone  20 mg Oral Q12H   pantoprazole  40 mg Oral Daily   senna-docusate  2 tablet Oral BID   sertraline  100 mg Oral Daily   sodium chloride flush  10-40 mL Intracatheter Q12H   Continuous Infusions:  sodium chloride 10 mL/hr at 04/20/21 0705   fentaNYL infusion INTRAVENOUS 100 mcg/hr  (04/20/21 0838)   ketamine (KETALAR) Adult IV Infusion 0.5 mg/kg/hr (04/20/21 0705)   methocarbamol (ROBAXIN) IV       LOS: 3 days    Time spent: 30 minutes    Barb Merino, MD [facility] Pager [phone]

## 2021-04-21 MED ORDER — POLYETHYLENE GLYCOL 3350 17 G PO PACK
17.0000 g | PACK | Freq: Two times a day (BID) | ORAL | Status: DC
  Administered 2021-04-21 – 2021-04-23 (×5): 17 g via ORAL
  Filled 2021-04-21 (×5): qty 1

## 2021-04-21 NOTE — Progress Notes (Signed)
PROGRESS NOTE    Christopher Carter  EYC:[id number] DOB: Nov 24, 1960 DOA: 04/17/2021 PCP: [name], MD    Brief Narrative:  60 year old gentleman with history of adenocarcinoma of the lung, hypertension, anxiety and depression presented to the emergency room with fever and chills.  Recently diagnosed with lung cancer status post resection, multiple complications including right apical abscess treated with chest tube drainage, pain control challenges, treated with radiation and discharged home after complicated hospitalizations on 8/2. Brought back to the hospital with severe unrelenting pain, fever and altered mental status. In the emergency room afebrile.  CTA PE showed slight improvement on fluid collection seen previously, scattered groundglass opacities, left base pleural tumor with possible superadded infection.  Started on broad-spectrum antibiotics and admitted to the hospital. Admitted to stepdown unit because he started on ketamine infusion for pain relief.  Also followed by pulmonary.  Subsequent MRI spines consistent with very aggressive spread of cancer into spine with cord compression, myositis. Urinary retention. 8/11 hospice discussion initiated.  Patient remains in the hospital with severe pain on multiple infusions of medications including ketamine and fentanyl.  Managed by palliative care.   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  Received antibiotics for 3 days.  Discontinued.  Likely source of fever is tumor.  Symptomatic treatment.  Cultures negative so far.  See goal of care discussion below.  Planning to continue Augmentin indefinitely for suspected tooth infection.  Unable to go  for any intervention.  Poorly differentiated adenocarcinoma of the lung: recurrence , aggressive spread into spine, widespread bone mets, cauda equina and cord compression: metastatic cancer pain. Patient was treated with ketamine infusion and fentanyl. Was found with severe back pain, neurological deficits, spasms and urinary retention. MRI thoracic and lumbar spine 8/10 with widespread metastatic disease, dural compression and cord compression. Still with severe unrelenting pain. 8/11, bedside goal of care discussion with palliative care Dr. Hilma Favors with patient and patient's wife.  Focuses on pain control.  Unlikely he will tolerate any cancer therapies.  He will not tolerate any neurosurgical intervention and this may not prove any helpful for survival.  -Currently on ketamine infusion, fentanyl infusion through the PICC line along with fentanyl patch and bolus fentanyl.     -On additional fentanyl patch and boluses through IV.   -Scheduled dose of benzodiazepine.   -Received high-dose radiation on his back 8/11 and a 2L. -DNR/DNI.   -Other symptomatic treatment including laxatives, oxygen, Foley catheter.   -Continue Zoloft.   -On methadone 20 mg twice a day today. -On gabapentin, IV Valium. -Start scheduled MiraLAX and Senokot today.  No bowel movements for the last 5 days.  8/13, optimal good control achieved today morning on current doses.  DVT prophylaxis: Place and maintain sequential compression device Start: 04/17/21 1202 SCDs Start: 04/17/21 1020   Code Status: DNR Family Communication: Wife at the bedside. Disposition Plan: Status is: Inpatient  Remains inpatient appropriate because:Ongoing active pain requiring inpatient pain management and Inpatient level of care appropriate due to severity of illness  Dispo: The patient is from: Home              Anticipated d/c is to: Home with possibly  home hospice              Patient currently is not medically stable to d/c.    Difficult to place patient No         Consultants:  Palliative Pulmonary Oncology Radiation oncology  Procedures:  None  Antimicrobials:  Antibiotics Given (last 72 hours)     Date/Time Action Medication Dose Rate   04/18/21 1312 New Bag/Given   ceFEPIme (MAXIPIME) 2 g in sodium chloride 0.9 % 100 mL IVPB 2 g 200 mL/hr   04/18/21 2211 New Bag/Given   ceFEPIme (MAXIPIME) 2 g in sodium chloride 0.9 % 100 mL IVPB 2 g 200 mL/hr   04/19/21 0534 New Bag/Given   ceFEPIme (MAXIPIME) 2 g in sodium chloride 0.9 % 100 mL IVPB 2 g 200 mL/hr          Subjective: Patient seen and examined.  Wife at the bedside.  After so many days, he looks more comfortable today.  He tells me that his pain is controlled today.  Currently on fentanyl and ketamine.  Slept well last night.  Wife at the bedside. Patient asked me why he has to eat if he is her anyway dying.  I recommended to take his favorite flavors any food he likes and also take laxatives so to avoid constipation that gives him comfort. Patient's wife asked whether she can wheel him out of the unit, will discuss with nursing staff.  Objective: Vitals:   04/20/21 2000 04/21/21 0700 04/21/21 0801 04/21/21 0832  BP:  (!) 162/79    Pulse: 96 99    Resp: (!) 4 (!) 6    Temp:    98.1 F (36.7 C)  TempSrc:    Oral  SpO2: 98% 97% 100%   Weight:      Height:        Intake/Output Summary (Last 24 hours) at 04/21/2021 0904 Last data filed at 04/21/2021 0314 Gross per 24 hour  Intake 551.85 ml  Output 925 ml  Net -373.15 ml   Filed Weights   04/17/21 0241 04/17/21 1210  Weight: 80.7 kg 80.1 kg    Examination: General: Frail debilitated sick looking gentleman.  Fairly comfortable today.  He is on room air. Cardiovascular: S1-S2 normal.  Mildly tachycardic. Respiratory: Bilateral clear.  No added sounds. Gastrointestinal: Soft.  Nontender.  Bowel sounds present. Ext: Upper extremity normal.  Has a PICC line. Extremely painful  lower extremities on movement, movements avoided. IV lines: PICC line Catheters: Foley catheter.  Plan to discharge with Foley.      Data Reviewed: I have personally reviewed following labs and imaging studies  CBC: Recent Labs  Lab 04/17/21 0323 04/18/21 0305  WBC 7.7 6.9  NEUTROABS 6.4  --   HGB 8.9* 9.2*  HCT 28.4* 30.6*  MCV 96.3 96.8  PLT 223 858   Basic Metabolic Panel: Recent Labs  Lab 04/17/21 0323 04/18/21 0305  NA 138 139  K 4.3 4.3  CL 98 102  CO2 30 28  GLUCOSE 158* 110*  BUN 9 11  CREATININE 0.65 0.48*  CALCIUM 9.1 8.8*   GFR: Estimated Creatinine Clearance: 103.9 mL/min (A) (by C-G formula based on SCr of 0.48 mg/dL (L)). Liver Function Tests: Recent Labs  Lab 04/17/21 0323 04/18/21 0305  AST 10* 11*  ALT 12 11  ALKPHOS 71 71  BILITOT 0.7 0.7  PROT 6.2* 5.9*  ALBUMIN 3.1* 2.8*   No results for input(s): LIPASE, AMYLASE in the last 168  hours. No results for input(s): AMMONIA in the last 168 hours. Coagulation Profile: Recent Labs  Lab 04/17/21 0323 04/18/21 0622  INR 1.0 1.1   Cardiac Enzymes: No results for input(s): CKTOTAL, CKMB, CKMBINDEX, TROPONINI in the last 168 hours. BNP (last 3 results) No results for input(s): PROBNP in the last 8760 hours. HbA1C: No results for input(s): HGBA1C in the last 72 hours. CBG: Recent Labs  Lab 04/18/21 0816 04/18/21 1135 04/18/21 1727 04/18/21 2133 04/19/21 0732  GLUCAP 134* 133* 180* 198* 190*   Lipid Profile: No results for input(s): CHOL, HDL, LDLCALC, TRIG, CHOLHDL, LDLDIRECT in the last 72 hours. Thyroid Function Tests: No results for input(s): TSH, T4TOTAL, FREET4, T3FREE, THYROIDAB in the last 72 hours. Anemia Panel: No results for input(s): VITAMINB12, FOLATE, FERRITIN, TIBC, IRON, RETICCTPCT in the last 72 hours. Sepsis Labs: Recent Labs  Lab 04/17/21 0323 04/18/21 0558  PROCALCITON  --  <0.10  LATICACIDVEN 0.7  --     Recent Results (from the past 240 hour(s))   Culture, blood (Routine x 2)     Status: None (Preliminary result)   Collection Time: 04/17/21  3:23 AM   Specimen: BLOOD  Result Value Ref Range Status   Specimen Description   Final    BLOOD SPECIMEN SOURCE NOT MARKED ON REQUISITION Performed at [facility] [address] [place], [place] [address]    Special Requests   Final    BOTTLES DRAWN AEROBIC AND ANAEROBIC Blood Culture adequate volume Performed at [facility] [address], [place], [place] [address]    Culture   Final    NO GROWTH 3 DAYS Performed at [facility], [place] [address] [place], [place] [address]    Report Status PENDING  Incomplete  SARS CORONAVIRUS 2 (TAT 6-24 HRS) Nasopharyngeal Nasopharyngeal Swab     Status: None   Collection Time: 04/17/21  5:42 AM   Specimen: Nasopharyngeal Swab  Result Value Ref Range Status   SARS Coronavirus 2 NEGATIVE NEGATIVE Final    Comment: (NOTE) SARS-CoV-2 target nucleic acids are NOT DETECTED.  The SARS-CoV-2 RNA is generally detectable in upper and lower respiratory specimens during the acute phase of infection. Negative results do not preclude SARS-CoV-2 infection, do not rule out co-infections with other pathogens, and should not be used as the sole basis for treatment or other patient management decisions. Negative results must be combined with clinical observations, patient history, and epidemiological information. The expected result is Negative.  Fact Sheet for Patients: SugarRoll.be  Fact Sheet for Healthcare Providers: https://www.woods-mathews.com/  This test is not yet approved or cleared by the [place] FDA and  has been authorized for detection and/or diagnosis of SARS-CoV-2 by FDA under an Emergency Use Authorization (EUA). This EUA will remain  in effect (meaning this test can be used) for the duration of the COVID-19 declaration under Se ction 564(b)(1) of  the Act, 21 U.S.C. section 360bbb-3(b)(1), unless the authorization is terminated or revoked sooner.  Performed at [facility], [place] [address], [place], [place] [address]   Resp Panel by RT-PCR (Flu A&B, Covid) Nasopharyngeal Swab     Status: None   Collection Time: 04/17/21  8:54 AM   Specimen: Nasopharyngeal Swab; Nasopharyngeal(NP) swabs in vial transport medium  Result Value Ref Range Status   SARS Coronavirus 2 by RT PCR NEGATIVE NEGATIVE Final    Comment: (NOTE) SARS-CoV-2 target nucleic acids are NOT DETECTED.  The SARS-CoV-2 RNA is generally detectable  in upper respiratory specimens during the acute phase of infection. The lowest concentration of SARS-CoV-2 viral copies this assay can detect is 138 copies/mL. A negative result does not preclude SARS-Cov-2 infection and should not be used as the sole basis for treatment or other patient management decisions. A negative result may occur with  improper specimen collection/handling, submission of specimen other than nasopharyngeal swab, presence of viral mutation(s) within the areas targeted by this assay, and inadequate number of viral copies(<138 copies/mL). A negative result must be combined with clinical observations, patient history, and epidemiological information. The expected result is Negative.  Fact Sheet for Patients:  EntrepreneurPulse.com.au  Fact Sheet for Healthcare Providers:  IncredibleEmployment.be  This test is no t yet approved or cleared by the [place] FDA and  has been authorized for detection and/or diagnosis of SARS-CoV-2 by FDA under an Emergency Use Authorization (EUA). This EUA will remain  in effect (meaning this test can be used) for the duration of the COVID-19 declaration under Section 564(b)(1) of the Act, 21 U.S.C.section 360bbb-3(b)(1), unless the authorization is terminated  or revoked sooner.       Influenza A by PCR NEGATIVE NEGATIVE  Final   Influenza B by PCR NEGATIVE NEGATIVE Final    Comment: (NOTE) The Xpert Xpress SARS-CoV-2/FLU/RSV plus assay is intended as an aid in the diagnosis of influenza from Nasopharyngeal swab specimens and should not be used as a sole basis for treatment. Nasal washings and aspirates are unacceptable for Xpert Xpress SARS-CoV-2/FLU/RSV testing.  Fact Sheet for Patients: EntrepreneurPulse.com.au  Fact Sheet for Healthcare Providers: IncredibleEmployment.be  This test is not yet approved or cleared by the [place] FDA and has been authorized for detection and/or diagnosis of SARS-CoV-2 by FDA under an Emergency Use Authorization (EUA). This EUA will remain in effect (meaning this test can be used) for the duration of the COVID-19 declaration under Section 564(b)(1) of the Act, 21 U.S.C. section 360bbb-3(b)(1), unless the authorization is terminated or revoked.  Performed at [facility], [place] [address] [place], [place] [address]   MRSA Next Gen by PCR, Nasal     Status: None   Collection Time: 04/17/21  1:16 PM   Specimen: Nasal Mucosa; Nasal Swab  Result Value Ref Range Status   MRSA by PCR Next Gen NOT DETECTED NOT DETECTED Final    Comment: (NOTE) The GeneXpert MRSA Assay (FDA approved for NASAL specimens only), is one component of a comprehensive MRSA colonization surveillance program. It is not intended to diagnose MRSA infection nor to guide or monitor treatment for MRSA infections. Test performance is not FDA approved in patients less than 72 years old. Performed at [facility], [place] [address] [place], [place] [address]          Radiology Studies: No results found.      Scheduled Meds:  amoxicillin-clavulanate  1 tablet Oral Q12H   vitamin C  1,000 mg Oral Daily   celecoxib  400 mg Oral BID   Chlorhexidine Gluconate Cloth  6 each Topical Daily   dexamethasone (DECADRON)  injection  8 mg Intravenous Q12H   diazepam  5 mg Oral TID   fentaNYL  1 patch Transdermal Q72H   gabapentin  300 mg Oral BID WC   And   gabapentin  600 mg Oral QHS   ipratropium-albuterol  3 mL Nebulization BID   labetalol  100 mg Oral BID   latanoprost  1 drop Both Eyes QHS   magic mouthwash  10 mL Oral TID   mouth rinse  15 mL Mouth Rinse BID   methadone  20 mg Oral Q12H   pantoprazole  40 mg Oral Daily   polyethylene glycol  17 g Oral BID   senna-docusate  2 tablet Oral BID   sertraline  100 mg Oral Daily   sodium chloride flush  10-40 mL Intracatheter Q12H   Continuous Infusions:  sodium chloride 10 mL/hr at 04/21/21 0313   fentaNYL infusion INTRAVENOUS 150 mcg/hr (04/21/21 0313)   ketamine (KETALAR) Adult IV Infusion 0.1 mg/kg/hr (04/21/21 0313)   methocarbamol (ROBAXIN) IV       LOS: 4 days    Time spent: 30 minutes    Barb Merino, MD [facility] Pager [phone]

## 2021-04-21 NOTE — Progress Notes (Signed)
Daily Progress Note   Patient Name: Christopher Carter       Date: 04/21/2021 DOB: 1961/09/09  Age: 60 y.o. MRN#: [id number] Attending Physician: Barb Merino, MD Primary Care Physician: [name], MD Admit Date: 04/17/2021  Reason for Consultation/Follow-up: Establishing goals of care and Pain control  Subjective: I saw and examined Christopher Carter today.  His wife, Christopher Carter, and sister-in-law were present at the bedside.  We discussed current pain level and he reports that he is not hurting at the moment but continues to have incident pain with any movement.  I reviewed MAR and he did not receive evening dose of methadone last night.  I talked with him and his wife about pain management plan for the day and also working to transition off of ketamine.  We discussed option of continuing current regimen today versus working to transition off of the ketamine.  Due to the fact that he has only received 1 dose of the higher dose of methadone and the fact that he is comfortable with this time, we discussed plan to continue with current regimen today and reassess tomorrow with likely plan to discontinue ketamine infusion at that time.  He has people coming to visit him today and we want to ensure that he is able to enjoy this regimen backslide with increasing pain.  Overall, family understands that he has very aggressive spread of cancer and goal is to work to maximize pain management through medications and radiation with eventual goal of working to transition home with hospice support.  Length of Stay: 4  Current Medications: Scheduled Meds:  . amoxicillin-clavulanate  1 tablet Oral Q12H  . vitamin C  1,000 mg Oral Daily  . celecoxib  400 mg Oral BID  . Chlorhexidine Gluconate Cloth  6 each  Topical Daily  . dexamethasone (DECADRON) injection  8 mg Intravenous Q12H  . diazepam  5 mg Oral TID  . fentaNYL  1 patch Transdermal Q72H  . gabapentin  300 mg Oral BID WC   And  . gabapentin  600 mg Oral QHS  . ipratropium-albuterol  3 mL Nebulization BID  . labetalol  100 mg Oral BID  . latanoprost  1 drop Both Eyes QHS  . magic mouthwash  10 mL Oral TID  . mouth rinse  15 mL Mouth  Rinse BID  . methadone  20 mg Oral Q12H  . pantoprazole  40 mg Oral Daily  . polyethylene glycol  17 g Oral BID  . senna-docusate  2 tablet Oral BID  . sertraline  100 mg Oral Daily  . sodium chloride flush  10-40 mL Intracatheter Q12H    Continuous Infusions: . sodium chloride 10 mL/hr at 04/21/21 0313  . fentaNYL infusion INTRAVENOUS 150 mcg/hr (04/21/21 1022)  . ketamine (KETALAR) Adult IV Infusion 0.1 mg/kg/hr (04/21/21 0313)  . methocarbamol (ROBAXIN) IV      PRN Meds: acetaminophen, benzonatate, diazepam, fentaNYL, fentaNYL (SUBLIMAZE) injection, fentaNYL (SUBLIMAZE) injection, ipratropium-albuterol, ketorolac, methocarbamol (ROBAXIN) IV, ondansetron **OR** ondansetron (ZOFRAN) IV, sodium chloride flush  Physical Exam   General: Alert, awake, in no acute distress.  Frail appearing Heart: Tachycardic. No murmur appreciated. Lungs: Fair air movement, clear Abdomen: Soft, nontender, mild distended, positive bowel sounds.   Ext: No significant edema Skin: Warm and dry        Vital Signs: BP (!) 139/92   Pulse 99   Temp 98.4 F (36.9 C) (Oral)   Resp 11   Ht 5\' 10"  (1.778 m)   Wt 80.1 kg   SpO2 94%   BMI 25.34 kg/m  SpO2: SpO2: 94 % O2 Device: O2 Device: Nasal Cannula O2 Flow Rate: O2 Flow Rate (L/min): 3 L/min  Intake/output summary:  Intake/Output Summary (Last 24 hours) at 04/21/2021 1414 Last data filed at 04/21/2021 2263 Gross per 24 hour  Intake 551.85 ml  Output 625 ml  Net -73.15 ml   LBM: Last BM Date: 04/17/21 Baseline Weight: Weight: 80.7 kg Most recent weight:  Weight: 80.1 kg       Palliative Assessment/Data:      Patient Active Problem List   Diagnosis Date Noted  . 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 Acute urinary retention 04/19/2021  . Multifocal pneumonia 04/17/2021  . Cancer related pain 04/17/2021  . Pain in lower back 04/17/2021  . Abscess of upper lobe of right lung with pneumonia ([place])   . End of life care   . Palliative care by specialist   . 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 Uncontrolled pain 03/18/2021  . Leukocytosis 03/18/2021  . Normocytic anemia 03/18/2021  . Tooth abscess 03/18/2021  . Thrombocytosis 03/18/2021  . 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  . Acute on chronic respiratory failure with hypoxia ([place]) 02/26/2021  . Malignant neoplasm of right upper lobe of lung ([place]) 02/26/2021  . Pneumonia of right lung due to infectious organism 02/26/2021  . Sepsis ([place]) 02/26/2021  . Major depressive disorder 02/26/2021  . S/P lobectomy of lung 01/22/2021  . Essential hypertension 01/15/2021  . Mass of upper lobe of right lung 12/22/2020  . Hemoptysis 12/22/2020    Palliative Care Assessment & Plan   Patient Profile: 60 year old male with non-small cell lung cancer with newly discovered aggressive bony mets involving thoracic and lumbar spine with cauda equina syndrome, intractable pain, and paraneoplastic myositis  Recommendations/Plan: Cancer-related pain: Currently reports pain is fairly well controlled.  I reviewed his pain regimen which includes fentanyl infusion, oral methadone, ketamine infusion, Toradol, Celebrex, fentanyl patch, steroids, and Valium for muscle spasm.  Discussed with bedside RN.  He did not receive evening dose of methadone as he was sleeping at time it was due.  As his pain is currently fairly well controlled, I recommended we continue with his current regimen including continuing ketamine until tomorrow to allow additional  doses  of methadone to get into his system prior to discontinuation of ketamine infusion.  While I doubt it will be the case, we also discussed need to continue to assess him frequently as we may also need to down titrate fentanyl infusion as methadone his steady state.    Code Status:    Code Status Orders  (From admission, onward)           Start     Ordered   04/17/21 1020  Do not attempt resuscitation (DNR)  Continuous       Question Answer Comment  In the event of cardiac or respiratory ARREST Do not call a "code blue"   In the event of cardiac or respiratory ARREST Do not perform Intubation, CPR, defibrillation or ACLS   In the event of cardiac or respiratory ARREST Use medication by any route, position, wound care, and other measures to relive pain and suffering. May use oxygen, suction and manual treatment of airway obstruction as needed for comfort.      04/17/21 1019           Code Status History     Date Active Date Inactive Code Status Order ID Comments User Context   03/18/2021 1527 04/10/2021 2013 Full Code [id number]  Norval Morton, MD Inpatient   02/26/2021 3212 03/07/2021 1843 Full Code [id number]  Vernelle Emerald, MD ED   01/22/2021 1529 01/26/2021 1518 Full Code [id number]  Elgie Collard, PA-C Inpatient       Prognosis: Guarded/poor  Discharge Planning: To Be Determined  Care plan was discussed with patient, RN, wife  Thank you for allowing the Palliative Medicine Team to assist in the care of this patient.   Time In: 1040 Time Out: 1120 Total Time 40 Prolonged Time Billed No      Greater than 50%  of this time was spent counseling and coordinating care related to the above assessment and plan.  Micheline Rough, MD  Please contact Palliative Medicine Team phone at [phone] for questions and concerns.

## 2021-04-21 NOTE — Progress Notes (Signed)
At beginning of shift patient reported 7/10 pain which had improved from the 10/10 he was experiencing for most of the day. I gave patient a 11mcg fentanyl bolus at this time per physicians written order. Patient fell asleep within an hour and has been sleeping since. I spoke to his palliative care provider Dr.Golding to confirm with her that it was ok to hold his meds if he was sleeping for comfort purposes. I gave one additional bolus around 2200 since he did not get his oral medications. He has not had any episodes of breakthrough pain throughout the night. Will continue to assess.

## 2021-04-22 LAB — CULTURE, BLOOD (ROUTINE X 2)
Culture: NO GROWTH
Special Requests: ADEQUATE

## 2021-04-22 MED ORDER — SODIUM CHLORIDE 0.9 % IV SOLN
0.1000 mg/kg/h | INTRAVENOUS | Status: DC
  Administered 2021-04-23: 0.1 mg/kg/h via INTRAVENOUS
  Filled 2021-04-22 (×2): qty 5

## 2021-04-22 NOTE — Progress Notes (Signed)
Old PROGRESS NOTE    BURWELL BETHEL  TDV:[id number] DOB: [birth year] DOA: 04/17/2021 PCP: [name], MD    Brief Narrative:  60 year old gentleman with history of adenocarcinoma of the lung, hypertension, anxiety and depression presented to the emergency room with fever and chills.  Recently diagnosed with lung cancer status post resection, multiple complications including right apical abscess treated with chest tube drainage, pain control challenges, treated with radiation and discharged home after complicated hospitalizations on 8/2. Brought back to the hospital with severe unrelenting pain, fever and altered mental status. In the emergency room afebrile.  CTA PE showed slight improvement on fluid collection seen previously, scattered groundglass opacities, left base pleural tumor with possible superadded infection.  Started on broad-spectrum antibiotics and admitted to the hospital. Admitted to stepdown unit because he started on ketamine infusion for pain relief.  Also followed by pulmonary.  Subsequent MRI spines consistent with very aggressive spread of cancer into spine with cord compression, myositis. Urinary retention. 8/11 hospice discussion initiated.  Patient remains in the hospital with severe pain on multiple infusions of medications including ketamine and fentanyl.  Managed by palliative care.  8/14 patient with some back discomfort.  Wife at bedside.  No other complaints.   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  Received antibiotics for 3 days.  Discontinued.  Likely source of fever is tumor.  Symptomatic treatment.  Cultures negative so far.  See goal of care discussion below.  8/14  planning to continue Augmentin indefinitely for suspected tooth infection.  Unable to go for any intervention    Poorly differentiated adenocarcinoma of the lung: recurrence , aggressive spread into spine, widespread bone mets, cauda equina and cord compression: metastatic cancer pain. Patient was treated with ketamine infusion and fentanyl. Was found with severe back pain, neurological deficits, spasms and urinary retention. MRI thoracic and lumbar spine 8/10 with widespread metastatic disease, dural compression and cord compression. Still with severe unrelenting pain. 8/11, bedside goal of care discussion with palliative care Dr. Hilma Favors with patient and patient's wife.  Focuses on pain control.  Unlikely he will tolerate any cancer therapies.  He will not tolerate any neurosurgical intervention and this may not prove any helpful for survival.  -Currently on ketamine infusion, fentanyl infusion through the PICC line along with fentanyl patch and bolus fentanyl.     -On additional fentanyl patch and boluses through IV.   -Scheduled dose of benzodiazepine.   -Received high-dose radiation on his back 8/11 and a 2L. -DNR/DNI.   -Other symptomatic treatment including laxatives, oxygen, Foley catheter.   -Continue Zoloft.   -On methadone 20 mg twice a day today. -On gabapentin, IV Valium. -Start scheduled MiraLAX and Senokot today.  No bowel movements for the last 5 days. 8/14-currently with some back discomfort.  Plan is to discontinue ketamine infusion per nursing per palliative care MDs instructions today    PPX : SCD Code Status: DNR Family Communication: Wife at the bedside. Disposition Plan: Status is: Inpatient  Remains inpatient appropriate because:Ongoing active pain requiring inpatient pain management and Inpatient level of care appropriate due to severity of illness  Dispo: The patient is from: Home  Anticipated d/c is to: Home with possibly home hospice               Patient currently is not medically stable to d/c.   Difficult to place patient No         Consultants:  Palliative Pulmonary Oncology Radiation oncology  Procedures:  None  Antimicrobials:  Antibiotics Given (last 72 hours)     Date/Time Action Medication Dose   04/21/21 0924 Given   amoxicillin-clavulanate (AUGMENTIN) 875-125 MG per tablet 1 tablet 1 tablet   04/21/21 1929 Given   amoxicillin-clavulanate (AUGMENTIN) 875-125 MG per tablet 1 tablet 1 tablet          Subjective: Patient with some back discomfort.  Otherwise he reports no other complaints.  Objective: Vitals:   04/21/21 1929 04/21/21 2036 04/22/21 0000 04/22/21 0700  BP: 134/86  98/64   Pulse: 97  87 (!) 102  Resp:   11 (!) 9  Temp:      TempSrc:      SpO2:  97% 97% 99%  Weight:      Height:        Intake/Output Summary (Last 24 hours) at 04/22/2021 0759 Last data filed at 04/22/2021 0700 Gross per 24 hour  Intake 762.47 ml  Output 1000 ml  Net -237.53 ml   Filed Weights   04/17/21 0241 04/17/21 1210  Weight: 80.7 kg 80.1 kg    Examination: NAD, calm CTA anteriorly no wheezing Regular S1-S2 no gallops Soft benign positive bowel sounds No edema/SCD Awake alert, mood and affect appropriate in current setting IV lines: PICC line Catheters: Foley catheter.  Plan to discharge with Foley.      Data Reviewed: I have personally reviewed following labs and imaging studies  CBC: Recent Labs  Lab 04/17/21 0323 04/18/21 0305  WBC 7.7 6.9  NEUTROABS 6.4  --   HGB 8.9* 9.2*  HCT 28.4* 30.6*  MCV 96.3 96.8  PLT 223 956   Basic Metabolic Panel: Recent Labs  Lab 04/17/21 0323 04/18/21 0305  NA 138 139  K 4.3 4.3  CL 98 102  CO2 30 28  GLUCOSE 158* 110*  BUN 9 11  CREATININE 0.65 0.48*  CALCIUM 9.1 8.8*   GFR: Estimated Creatinine Clearance: 103.9 mL/min (A) (by C-G formula based on SCr of 0.48 mg/dL (L)). Liver Function Tests: Recent Labs  Lab 04/17/21 0323  04/18/21 0305  AST 10* 11*  ALT 12 11  ALKPHOS 71 71  BILITOT 0.7 0.7  PROT 6.2* 5.9*  ALBUMIN 3.1* 2.8*   No results for input(s): LIPASE, AMYLASE in the last 168 hours. No results for input(s): AMMONIA in the last 168 hours. Coagulation Profile: Recent Labs  Lab 04/17/21 0323 04/18/21 0622  INR 1.0 1.1   Cardiac Enzymes: No results for input(s): CKTOTAL, CKMB, CKMBINDEX, TROPONINI in the last 168 hours. BNP (last 3 results) No results for input(s): PROBNP in the last 8760 hours. HbA1C: No results for input(s): HGBA1C in the last 72 hours. CBG: Recent Labs  Lab 04/18/21 0816 04/18/21 1135 04/18/21 1727 04/18/21 2133 04/19/21 0732  GLUCAP 134* 133* 180* 198* 190*   Lipid Profile: No results for input(s): CHOL, HDL, LDLCALC, TRIG, CHOLHDL, LDLDIRECT in the last 72 hours. Thyroid Function Tests: No results for input(s): TSH, T4TOTAL, FREET4, T3FREE, THYROIDAB in the last 72 hours. Anemia Panel: No results for input(s): VITAMINB12, FOLATE, FERRITIN, TIBC, IRON, RETICCTPCT in the last 72 hours. Sepsis Labs: Recent Labs  Lab 04/17/21 0323 04/18/21 3875  PROCALCITON  --  <0.10  LATICACIDVEN 0.7  --     Recent Results (from the past 240 hour(s))  Culture, blood (Routine x 2)     Status: None (Preliminary result)   Collection Time: 04/17/21  3:23 AM   Specimen: BLOOD  Result Value Ref Range Status   Specimen Description   Final    BLOOD SPECIMEN SOURCE NOT MARKED ON REQUISITION Performed at [facility] [address] [place], [place] [address]    Special Requests   Final    BOTTLES DRAWN AEROBIC AND ANAEROBIC Blood Culture adequate volume Performed at [facility] [address], [place], [place] [address]    Culture   Final    NO GROWTH 4 DAYS Performed at [facility], [place] [address] [place], [place] [address]    Report Status PENDING  Incomplete  SARS CORONAVIRUS 2 (TAT 6-24 HRS) Nasopharyngeal  Nasopharyngeal Swab     Status: None   Collection Time: 04/17/21  5:42 AM   Specimen: Nasopharyngeal Swab  Result Value Ref Range Status   SARS Coronavirus 2 NEGATIVE NEGATIVE Final    Comment: (NOTE) SARS-CoV-2 target nucleic acids are NOT DETECTED.  The SARS-CoV-2 RNA is generally detectable in upper and lower respiratory specimens during the acute phase of infection. Negative results do not preclude SARS-CoV-2 infection, do not rule out co-infections with other pathogens, and should not be used as the sole basis for treatment or other patient management decisions. Negative results must be combined with clinical observations, patient history, and epidemiological information. The expected result is Negative.  Fact Sheet for Patients: SugarRoll.be  Fact Sheet for Healthcare Providers: https://www.woods-mathews.com/  This test is not yet approved or cleared by the [place] FDA and  has been authorized for detection and/or diagnosis of SARS-CoV-2 by FDA under an Emergency Use Authorization (EUA). This EUA will remain  in effect (meaning this test can be used) for the duration of the COVID-19 declaration under Se ction 564(b)(1) of the Act, 21 U.S.C. section 360bbb-3(b)(1), unless the authorization is terminated or revoked sooner.  Performed at [facility], [place] [address] [place], [place] [address]   Resp Panel by RT-PCR (Flu A&B, Covid) Nasopharyngeal Swab     Status: None   Collection Time: 04/17/21  8:54 AM   Specimen: Nasopharyngeal Swab; Nasopharyngeal(NP) swabs in vial transport medium  Result Value Ref Range Status   SARS Coronavirus 2 by RT PCR NEGATIVE NEGATIVE Final    Comment: (NOTE) SARS-CoV-2 target nucleic acids are NOT DETECTED.  The SARS-CoV-2 RNA is generally detectable in upper respiratory specimens during the acute phase of infection. The lowest concentration of SARS-CoV-2 viral copies this assay can  detect is 138 copies/mL. A negative result does not preclude SARS-Cov-2 infection and should not be used as the sole basis for treatment or other patient management decisions. A negative result may occur with  improper specimen collection/handling, submission of specimen other than nasopharyngeal swab, presence of viral mutation(s) within the areas targeted by this assay, and inadequate number of viral copies(<138 copies/mL). A negative result must be combined with clinical observations, patient history, and epidemiological information. The expected result is Negative.  Fact Sheet for Patients:  EntrepreneurPulse.com.au  Fact Sheet for Healthcare Providers:  IncredibleEmployment.be  This test is no t yet approved or cleared by the [place] FDA and  has been authorized for detection and/or diagnosis of SARS-CoV-2 by FDA under an Emergency Use Authorization (EUA). This EUA  will remain  in effect (meaning this test can be used) for the duration of the COVID-19 declaration under Section 564(b)(1) of the Act, 21 U.S.C.section 360bbb-3(b)(1), unless the authorization is terminated  or revoked sooner.       Influenza A by PCR NEGATIVE NEGATIVE Final   Influenza B by PCR NEGATIVE NEGATIVE Final    Comment: (NOTE) The Xpert Xpress SARS-CoV-2/FLU/RSV plus assay is intended as an aid in the diagnosis of influenza from Nasopharyngeal swab specimens and should not be used as a sole basis for treatment. Nasal washings and aspirates are unacceptable for Xpert Xpress SARS-CoV-2/FLU/RSV testing.  Fact Sheet for Patients: EntrepreneurPulse.com.au  Fact Sheet for Healthcare Providers: IncredibleEmployment.be  This test is not yet approved or cleared by the [place] FDA and has been authorized for detection and/or diagnosis of SARS-CoV-2 by FDA under an Emergency Use Authorization (EUA). This EUA will remain in  effect (meaning this test can be used) for the duration of the COVID-19 declaration under Section 564(b)(1) of the Act, 21 U.S.C. section 360bbb-3(b)(1), unless the authorization is terminated or revoked.  Performed at [facility], [place] [address], [place], [place] [address]   MRSA Next Gen by PCR, Nasal     Status: None   Collection Time: 04/17/21  1:16 PM   Specimen: Nasal Mucosa; Nasal Swab  Result Value Ref Range Status   MRSA by PCR Next Gen NOT DETECTED NOT DETECTED Final    Comment: (NOTE) The GeneXpert MRSA Assay (FDA approved for NASAL specimens only), is one component of a comprehensive MRSA colonization surveillance program. It is not intended to diagnose MRSA infection nor to guide or monitor treatment for MRSA infections. Test performance is not FDA approved in patients less than 47 years old. Performed at [facility], [place] [address], [place], [place] [address]          Radiology Studies: No results found.      Scheduled Meds:  amoxicillin-clavulanate  1 tablet Oral Q12H   vitamin C  1,000 mg Oral Daily   celecoxib  400 mg Oral BID   Chlorhexidine Gluconate Cloth  6 each Topical Daily   dexamethasone (DECADRON) injection  8 mg Intravenous Q12H   diazepam  5 mg Oral TID   fentaNYL  1 patch Transdermal Q72H   gabapentin  300 mg Oral BID WC   And   gabapentin  600 mg Oral QHS   ipratropium-albuterol  3 mL Nebulization BID   labetalol  100 mg Oral BID   latanoprost  1 drop Both Eyes QHS   magic mouthwash  10 mL Oral TID   mouth rinse  15 mL Mouth Rinse BID   methadone  20 mg Oral Q12H   pantoprazole  40 mg Oral Daily   polyethylene glycol  17 g Oral BID   senna-docusate  2 tablet Oral BID   sertraline  100 mg Oral Daily   sodium chloride flush  10-40 mL Intracatheter Q12H   Continuous Infusions:  sodium chloride 10 mL/hr at 04/22/21 0536   fentaNYL infusion INTRAVENOUS 150 mcg/hr (04/22/21 0536)   ketamine  (KETALAR) Adult IV Infusion 0.1 mg/kg/hr (04/22/21 0536)   methocarbamol (ROBAXIN) IV       LOS: 5 days    Time spent: 35 minutes with more than 50% on [place], MD [facility] Pager [phone]

## 2021-04-22 NOTE — Progress Notes (Signed)
Daily Progress Note   Patient Name: Christopher Carter       Date: 04/22/2021 DOB: 1960-09-12  Age: 60 y.o. MRN#: [id number] Attending Physician: Nolberto Hanlon, MD Primary Care Physician: [name], MD Admit Date: 04/17/2021  Reason for Consultation/Follow-up: Establishing goals of care and Pain control  Subjective: I saw and examined Christopher Carter today.  His wife, Christopher Carter, and sister in law were present at the bedside.  We reviewed pain medications and MAR.  Short trial off of ketamine today, but his pain was worsening and so decision made to restart today.    We discussed pain regimen and plan for pain management at time of eventual discharge.  Family had questions regarding medications and PCA pump that we reviewed today.  Overall, family understands that he has very aggressive spread of cancer and goal is to work to maximize pain management through medications and radiation with eventual goal of working to transition home with hospice support.  Length of Stay: 5  Current Medications: Scheduled Meds:  . amoxicillin-clavulanate  1 tablet Oral Q12H  . vitamin C  1,000 mg Oral Daily  . celecoxib  400 mg Oral BID  . Chlorhexidine Gluconate Cloth  6 each Topical Daily  . dexamethasone (DECADRON) injection  8 mg Intravenous Q12H  . diazepam  5 mg Oral TID  . fentaNYL  1 patch Transdermal Q72H  . gabapentin  300 mg Oral BID WC   And  . gabapentin  600 mg Oral QHS  . ipratropium-albuterol  3 mL Nebulization BID  . labetalol  100 mg Oral BID  . latanoprost  1 drop Both Eyes QHS  . magic mouthwash  10 mL Oral TID  . mouth rinse  15 mL Mouth Rinse BID  . methadone  20 mg Oral Q12H  . pantoprazole  40 mg Oral Daily  . polyethylene glycol  17 g Oral BID  . senna-docusate  2  tablet Oral BID  . sertraline  100 mg Oral Daily  . sodium chloride flush  10-40 mL Intracatheter Q12H    Continuous Infusions: . sodium chloride 10 mL/hr at 04/22/21 0536  . fentaNYL infusion INTRAVENOUS 200 mcg/hr (04/22/21 1358)  . ketamine (KETALAR) Adult IV Infusion    . methocarbamol (ROBAXIN) IV      PRN Meds: acetaminophen, benzonatate, diazepam,  fentaNYL, fentaNYL (SUBLIMAZE) injection, fentaNYL (SUBLIMAZE) injection, ipratropium-albuterol, ketorolac, methocarbamol (ROBAXIN) IV, ondansetron **OR** ondansetron (ZOFRAN) IV, sodium chloride flush  Physical Exam   General: Alert, awake, in no acute distress.  Frail appearing Heart: Tachycardic. No murmur appreciated. Lungs: Fair air movement, clear Abdomen: Soft, nontender, mild distended, positive bowel sounds.   Ext: No significant edema Skin: Warm and dry        Vital Signs: BP (!) 130/99   Pulse (!) 102   Temp 100.3 F (37.9 C) (Oral)   Resp (!) 25   Ht 5\' 10"  (1.778 m)   Wt 80.1 kg   SpO2 99%   BMI 25.34 kg/m  SpO2: SpO2: 99 % O2 Device: O2 Device: Nasal Cannula O2 Flow Rate: O2 Flow Rate (L/min): 3 L/min  Intake/output summary:  Intake/Output Summary (Last 24 hours) at 04/22/2021 1509 Last data filed at 04/22/2021 0700 Gross per 24 hour  Intake 762.47 ml  Output 1000 ml  Net -237.53 ml    LBM: Last BM Date: 04/17/21 Baseline Weight: Weight: 80.7 kg Most recent weight: Weight: 80.1 kg       Palliative Assessment/Data:      Patient Active Problem List   Diagnosis Date Noted  . 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 Acute urinary retention 04/19/2021  . Multifocal pneumonia 04/17/2021  . Cancer related pain 04/17/2021  . Pain in lower back 04/17/2021  . Abscess of upper lobe of right lung with pneumonia ([place])   . End of life care   . Palliative care by specialist   . 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 Uncontrolled pain 03/18/2021  .  Leukocytosis 03/18/2021  . Normocytic anemia 03/18/2021  . Tooth abscess 03/18/2021  . Thrombocytosis 03/18/2021  . 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  . Acute on chronic respiratory failure with hypoxia ([place]) 02/26/2021  . Malignant neoplasm of right upper lobe of lung ([place]) 02/26/2021  . Pneumonia of right lung due to infectious organism 02/26/2021  . Sepsis ([place]) 02/26/2021  . Major depressive disorder 02/26/2021  . S/P lobectomy of lung 01/22/2021  . Essential hypertension 01/15/2021  . Mass of upper lobe of right lung 12/22/2020  . Hemoptysis 12/22/2020    Palliative Care Assessment & Plan   Patient Profile: 60 year old male with non-small cell lung cancer with newly discovered aggressive bony mets involving thoracic and lumbar spine with cauda equina syndrome, intractable pain, and paraneoplastic myositis  Recommendations/Plan: Cancer-related pain: Currently reports pain is fairly well controlled.  Plan was to stop ketamine but pain worsened after stopping ketamine.  Restarted ketamine until tomorrow to allow additional doses of methadone to get into his system prior to discontinuation of ketamine infusion.   Discussed long term plan for pain management with family and answered questions, including review of PCAs.   Code Status:    Code Status Orders  (From admission, onward)           Start     Ordered   04/17/21 1020  Do not attempt resuscitation (DNR)  Continuous       Question Answer Comment  In the event of cardiac or respiratory ARREST Do not call a "code blue"   In the event of cardiac or respiratory ARREST Do not perform Intubation, CPR, defibrillation or ACLS   In the event of cardiac or respiratory ARREST Use medication by any route, position, wound care, and other measures to relive pain and suffering. May use oxygen, suction and manual treatment of  airway obstruction as needed for comfort.      04/17/21 1019           Code Status History      Date Active Date Inactive Code Status Order ID Comments User Context   03/18/2021 1527 04/10/2021 2013 Full Code [id number]  Norval Morton, MD Inpatient   02/26/2021 2091 03/07/2021 1843 Full Code [id number]  Vernelle Emerald, MD ED   01/22/2021 1529 01/26/2021 1518 Full Code [id number]  Elgie Collard, PA-C Inpatient       Prognosis: Guarded/poor  Discharge Planning: To Be Determined  Care plan was discussed with patient, RN, wife  Thank you for allowing the Palliative Medicine Team to assist in the care of this patient.   Time In: 1000 Time Out: 1040 Total Time 40 Prolonged Time Billed No   Greater than 50%  of this time was spent counseling and coordinating care related to the above assessment and plan.   Micheline Rough, MD  Please contact Palliative Medicine Team phone at [phone] for questions and concerns.

## 2021-04-23 ENCOUNTER — Ambulatory Visit
Admission: RE | Admit: 2021-04-23 | Discharge: 2021-04-23 | Disposition: A | Discharge status: Home / Self Care | Payer: 59 | Source: Ambulatory Visit | Attending: Radiation Oncology | Admitting: Radiation Oncology

## 2021-04-23 ENCOUNTER — Inpatient Hospital Stay (HOSPITAL_COMMUNITY): Payer: 59

## 2021-04-23 IMAGING — DX DG CHEST 1V PORT
1 series · 1 of 1 positions shown · non-contrast
Comparison: [DATE].

CLINICAL DATA: Fever, shortness of breath.

EXAM:
PORTABLE CHEST 1 VIEW

[chest ap]
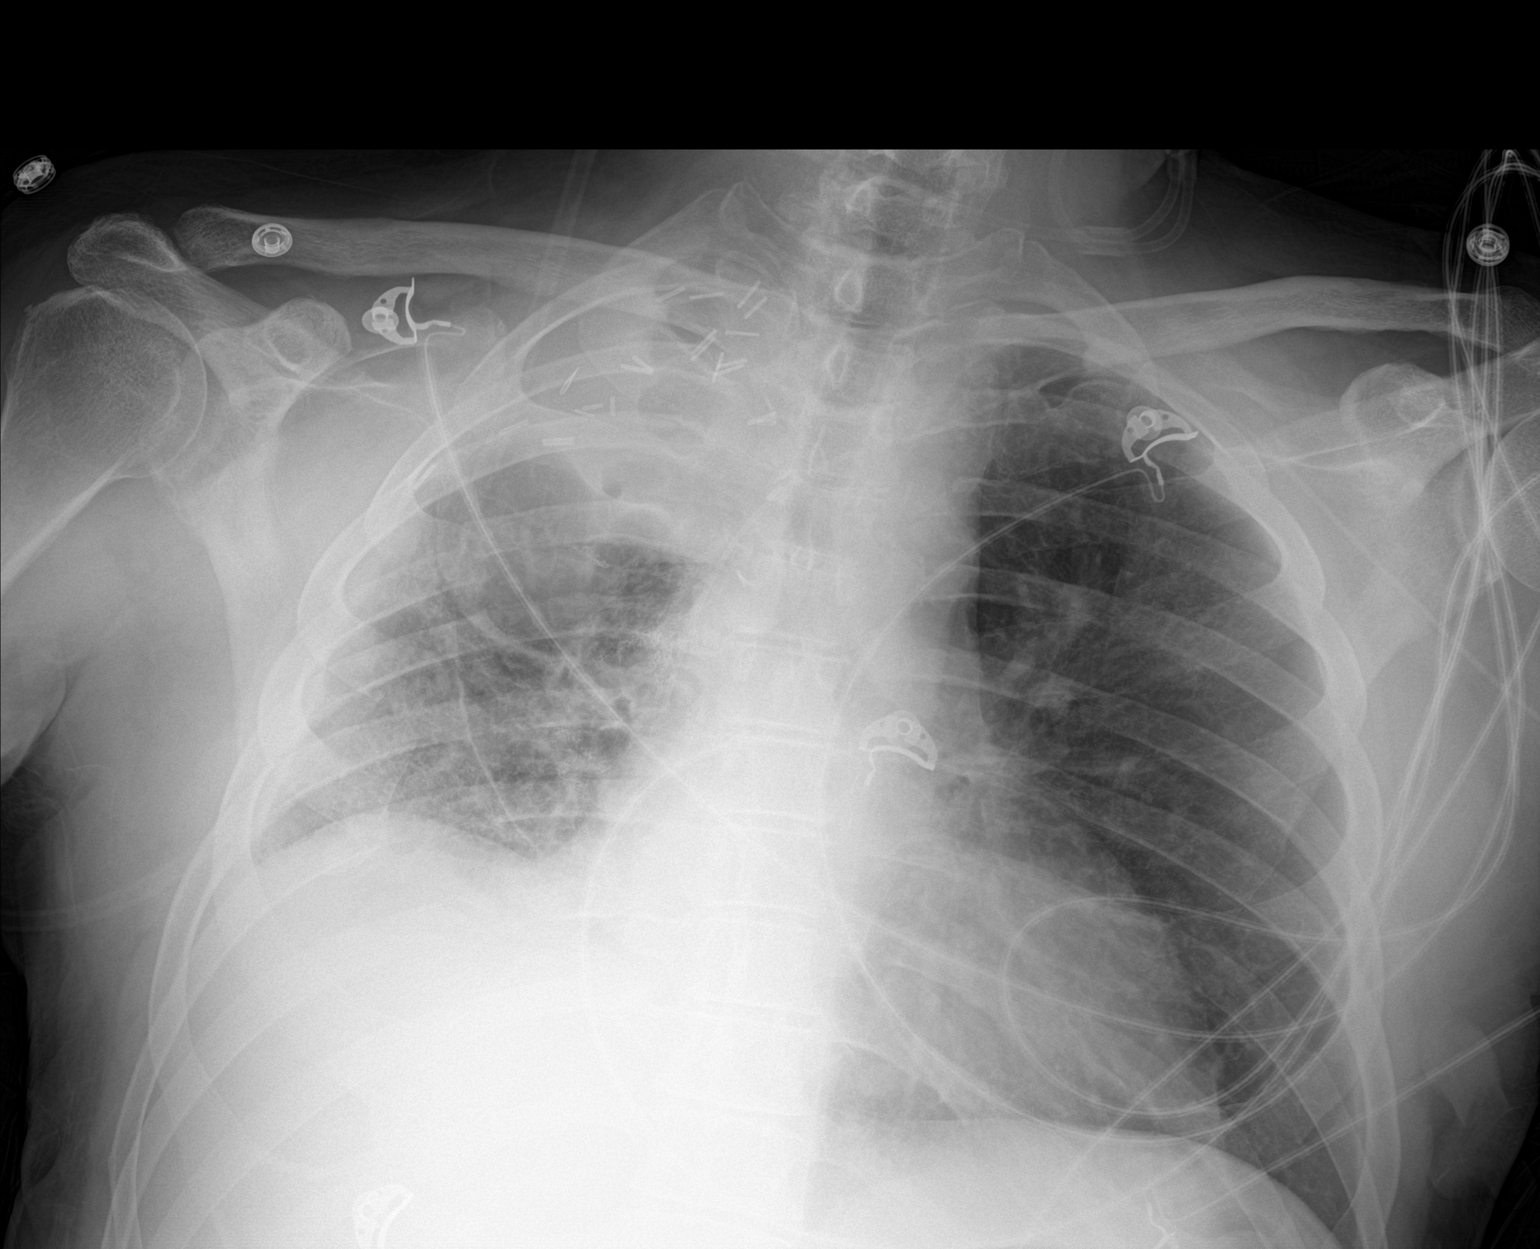

[1 of 1 positions shown; findings below may reference images not displayed]

FINDINGS: Stable cardiomediastinal silhouette. No pneumothorax is noted. Left
lung is clear. Stable stable postsurgical changes and opacity are
noted in the right upper lobe. Mild right basilar subsegmental
atelectasis or inflammation is noted. Bony thorax is unremarkable.
IMPRESSION: Stable postsurgical changes and abnormal opacity are noted in right
upper lobe. Mild right basilar subsegmental atelectasis or
inflammation is noted.

## 2021-04-23 MED ORDER — HYDRALAZINE HCL 25 MG PO TABS: 25.0000 mg | ORAL_TABLET | Freq: Four times a day (QID) | ORAL | Status: DC | PRN

## 2021-04-23 MED ORDER — IRBESARTAN 75 MG PO TABS
75.0000 mg | ORAL_TABLET | Freq: Every day | ORAL | Status: DC
  Administered 2021-04-23 – 2021-04-24 (×2): 75 mg via ORAL
  Filled 2021-04-23 (×2): qty 1

## 2021-04-23 MED ORDER — METHYLNALTREXONE BROMIDE 12 MG/0.6ML ~~LOC~~ SOLN
12.0000 mg | Freq: Once | SUBCUTANEOUS | Status: DC
  Filled 2021-04-23: qty 0.6

## 2021-04-23 MED ORDER — MAGNESIUM OXIDE -MG SUPPLEMENT 400 (240 MG) MG PO TABS
800.0000 mg | ORAL_TABLET | Freq: Every day | ORAL | Status: DC
  Administered 2021-04-23 – 2021-04-24 (×2): 800 mg via ORAL
  Filled 2021-04-23 (×2): qty 2

## 2021-04-23 MED ORDER — LACTULOSE 10 GM/15ML PO SOLN
20.0000 g | Freq: Every day | ORAL | Status: DC
  Administered 2021-04-23: 20 g via ORAL
  Filled 2021-04-23: qty 30

## 2021-04-23 NOTE — TOC Progression Note (Signed)
Transition of Care [facility]) - Progression Note    Patient Details  Name: KRYSTLE OBERMAN MRN: [id number] Date of Birth: 1960-10-10  Transition of Care [facility]) CM/SW Contact  Leeroy Cha, RN Phone Number: 04/23/2021, 7:54 AM  Clinical Narrative:    Per palliative care note goal is now to control pain still on iv ketamine, goal is for home with hospice care.   Expected Discharge Plan: [place] Barriers to Discharge: Continued Medical Work up  Expected Discharge Plan and Services Expected Discharge Plan: [place]   Discharge Planning Services: CM Consult   Living arrangements for the past 2 months: Single Family Home Expected Discharge Date:  (unknown)                                     Social Determinants of Health (SDOH) Interventions    Readmission Risk Interventions No flowsheet data found.

## 2021-04-23 NOTE — Progress Notes (Signed)
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  Patient with recent hospitalization 7/10 - 8/2 with respiratory failure, recurrent hemoptysis with associated pneumonia right-sided pleural fluid collection s/p drain and extended antibiotic course.  IR drain was removed on 8/1; and patient was discharged on 14-day course of antibiotics.  In the ED, CT angiogram chest with slight decrease of previously seen fluid collection, scattered groundglass opacities, increase in possible left pleural tumor with possible superimposed infection.  Patient was started on broad-spectrum antibiotics.  TRH consulted for further evaluation and management.   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 every 12 hours --Gabapentin 300 mg twice daily, 600 Robaxin 500 mg IV every 6 hours as needed muscle spasms mg nightly --Celebrex 40 mg p.o. twice daily --Decadron 8 mg IV every 12 hours  Non-small cell lung cancer with metastasis thoracic/lumbar spine with cauda equina syndrome Patient  follows with medical oncology outpatient, Dr. Earlie Server.  Patient is status post right upper lobe lobectomy and node dissection by CTS, Dr. Koleen Nimrod on 5/16.  Currently undergoing XRT.  Sepsis: Ruled out Initially presenting with altered mental status, fever.  Thought to initially be recurrent pneumonia.  Was started on empiric antibiotics and completed 3-day course now discontinued.  Etiology of his fever likely related to progression of malignancy.  Really continues on Augmentin, likely indefinitely for suspected dental infection.  Culture on admission with no growth x5 days.  Previous sputum culture on 7/25 with normal oral flora.  Previous blood cultures unrevealing and pleural body fluid culture with no organisms seen. --Continue supportive care  Anemia of chronic disease due to malignancy Hemoglobin 9.2 on 8/10, stable.  Urinary retention --continue Foley catheter  Dental pain/infection During previous hospitalization, patient was seen by in-house dental surgery who recommended antibiotics and outpatient follow-up with his orthodontist.  Initial plan was for root canal treatment on 03/19/2021 but this was postponed it due to his intractable pain and events as above. --Continue Augmentin BID  Essential hypertension Was previously on irbesartan 75 mg p.o. daily, now has been discontinued. --Continue home labetalol 100 mg p.o. twice daily --restart irbesartan 75mg  PO daily --Hydralazine 25mg  PO q6h for SBP >180 or DBP >110 --continue to monitor BP closely  Type 2 diabetes mellitus Hemoglobin A1c 6.5, well controlled.  Anxiety/depression --Zoloft 100 mg p.o. daily --Valium 5 mg p.o. 3 times daily --Valium 5 mg IV every 4 hours as needed anxiety/muscle spasms  Constipation Patient reports last BM on 8/10. --MiraLAX twice daily --Senokot 2 tabs p.o. twice daily --Start lactulose 20 g p.o. daily  GERD: Continue PPI   DVT prophylaxis: Place and  maintain sequential compression  device Start: 04/17/21 1202 SCDs Start: 04/17/21 1020   Code Status: DNR Family Communication: Updated patient's spouse who is present at bedside this morning.  Disposition Plan:  Level of care: Stepdown Status is: Inpatient  Remains inpatient appropriate because:Ongoing diagnostic testing needed not appropriate for outpatient work up, Unsafe d/c plan, IV treatments appropriate due to intensity of illness or inability to take PO, and Inpatient level of care appropriate due to severity of illness  Dispo: The patient is from: Home              Anticipated d/c is to:  Home with hospice once ketamine infusion titrated off per palliative care              Patient currently is not medically stable to d/c.   Difficult to place patient No   Consultants:  Palliative Care PCCM - signed off 8/11  Procedures:  none  Antimicrobials:  Vancomycin 8/9 - 8/10 Cefepime 8/9 - 8/11 Metronidazole 8/9 - 8/10 Augmentin 8/12>>   Subjective: Patient seen examined bedside, resting comfortably.  Spouse present.  Continues on fentanyl and ketamine infusion.  Pain is moderately controlled.  Goal is to return home once ketamine infusion has been titrated off.  Awaiting further recommendations from palliative care.  Patient and spouse appreciative all the efforts from the hospital staff.  Patient reports no bowel movement since 8/10 and spouse concerned about breath sounds as he has been receiving breathing treatments for this.  No other questions or concerns at this time.  Denies headache, no chest pain, no palpitations, no shortness of breath, no abdominal pain.  No acute concerns overnight per nursing staff.  Objective: Vitals:   04/23/21 0600 04/23/21 0800 04/23/21 0900 04/23/21 1011  BP:   (!) 169/79 (!) 167/89  Pulse: 97  (!) 101 96  Resp: 13  17   Temp:  98.5 F (36.9 C) 98.5 F (36.9 C)   TempSrc:  Axillary Axillary   SpO2: 92% 92% 93%   Weight:      Height:        Intake/Output Summary  (Last 24 hours) at 04/23/2021 1137 Last data filed at 04/23/2021 1048 Gross per 24 hour  Intake 814.7 ml  Output 2450 ml  Net -1635.3 ml   Filed Weights   04/17/21 0241 04/17/21 1210  Weight: 80.7 kg 80.1 kg    Examination:  General exam: Appears calm and comfortable, chronically ill in appearance Respiratory system: Clear to auscultation. Respiratory effort normal.  On room air Cardiovascular system: S1 & S2 heard, RRR. No JVD, murmurs, rubs, gallops or clicks. No pedal edema. Gastrointestinal system: Abdomen is nondistended, soft and nontender. No organomegaly or masses felt. Normal bowel sounds heard. Central nervous system: Alert and oriented. No focal neurological deficits. Extremities: Moves all extremities independently Skin: No rashes, lesions or ulcers Psychiatry: Judgement and insight appear normal. Mood & affect appropriate.     Data Reviewed: I have personally reviewed following labs and imaging studies  CBC: Recent Labs  Lab 04/17/21 0323 04/18/21 0305  WBC 7.7 6.9  NEUTROABS 6.4  --   HGB 8.9* 9.2*  HCT 28.4* 30.6*  MCV 96.3 96.8  PLT 223 161   Basic Metabolic Panel: Recent Labs  Lab 04/17/21 0323 04/18/21 0305  NA 138 139  K 4.3 4.3  CL 98 102  CO2 30 28  GLUCOSE 158* 110*  BUN 9 11  CREATININE 0.65 0.48*  CALCIUM 9.1 8.8*   GFR: Estimated  Creatinine Clearance: 103.9 mL/min (A) (by C-G formula based on SCr of 0.48 mg/dL (L)). Liver Function Tests: Recent Labs  Lab 04/17/21 0323 04/18/21 0305  AST 10* 11*  ALT 12 11  ALKPHOS 71 71  BILITOT 0.7 0.7  PROT 6.2* 5.9*  ALBUMIN 3.1* 2.8*   No results for input(s): LIPASE, AMYLASE in the last 168 hours. No results for input(s): AMMONIA in the last 168 hours. Coagulation Profile: Recent Labs  Lab 04/17/21 0323 04/18/21 0622  INR 1.0 1.1   Cardiac Enzymes: No results for input(s): CKTOTAL, CKMB, CKMBINDEX, TROPONINI in the last 168 hours. BNP (last 3 results) No results for input(s):  PROBNP in the last 8760 hours. HbA1C: No results for input(s): HGBA1C in the last 72 hours. CBG: Recent Labs  Lab 04/18/21 0816 04/18/21 1135 04/18/21 1727 04/18/21 2133 04/19/21 0732  GLUCAP 134* 133* 180* 198* 190*   Lipid Profile: No results for input(s): CHOL, HDL, LDLCALC, TRIG, CHOLHDL, LDLDIRECT in the last 72 hours. Thyroid Function Tests: No results for input(s): TSH, T4TOTAL, FREET4, T3FREE, THYROIDAB in the last 72 hours. Anemia Panel: No results for input(s): VITAMINB12, FOLATE, FERRITIN, TIBC, IRON, RETICCTPCT in the last 72 hours. Sepsis Labs: Recent Labs  Lab 04/17/21 0323 04/18/21 0558  PROCALCITON  --  <0.10  LATICACIDVEN 0.7  --     Recent Results (from the past 240 hour(s))  Culture, blood (Routine x 2)     Status: None   Collection Time: 04/17/21  3:23 AM   Specimen: BLOOD  Result Value Ref Range Status   Specimen Description   Final    BLOOD SPECIMEN SOURCE NOT MARKED ON REQUISITION Performed at [facility], [place] [address], [place], [place] [address]    Special Requests   Final    BOTTLES DRAWN AEROBIC AND ANAEROBIC Blood Culture adequate volume Performed at [place] [address], [place], [place] [address]    Culture   Final    NO GROWTH 5 DAYS Performed at [facility], [place] [address], [place], [place] [address]    Report Status 04/22/2021 FINAL  Final  SARS CORONAVIRUS 2 (TAT 6-24 HRS) Nasopharyngeal Nasopharyngeal Swab     Status: None   Collection Time: 04/17/21  5:42 AM   Specimen: Nasopharyngeal Swab  Result Value Ref Range Status   SARS Coronavirus 2 NEGATIVE NEGATIVE Final    Comment: (NOTE) SARS-CoV-2 target nucleic acids are NOT DETECTED.  The SARS-CoV-2 RNA is generally detectable in upper and lower respiratory specimens during the acute phase of infection. Negative results do not preclude SARS-CoV-2 infection, do not rule out co-infections with other pathogens, and should not  be used as the sole basis for treatment or other patient management decisions. Negative results must be combined with clinical observations, patient history, and epidemiological information. The expected result is Negative.  Fact Sheet for Patients: SugarRoll.be  Fact Sheet for Healthcare Providers: https://www.woods-mathews.com/  This test is not yet approved or cleared by the [place] FDA and  has been authorized for detection and/or diagnosis of SARS-CoV-2 by FDA under an Emergency Use Authorization (EUA). This EUA will remain  in effect (meaning this test can be used) for the duration of the COVID-19 declaration under Se ction 564(b)(1) of the Act, 21 U.S.C. section 360bbb-3(b)(1), unless the authorization is terminated or revoked sooner.  Performed at [facility], [place] [address], [place], [place] [address]   Resp Panel by RT-PCR (Flu A&B, Covid) Nasopharyngeal Swab  Status: None   Collection Time: 04/17/21  8:54 AM   Specimen: Nasopharyngeal Swab; Nasopharyngeal(NP) swabs in vial transport medium  Result Value Ref Range Status   SARS Coronavirus 2 by RT PCR NEGATIVE NEGATIVE Final    Comment: (NOTE) SARS-CoV-2 target nucleic acids are NOT DETECTED.  The SARS-CoV-2 RNA is generally detectable in upper respiratory specimens during the acute phase of infection. The lowest concentration of SARS-CoV-2 viral copies this assay can detect is 138 copies/mL. A negative result does not preclude SARS-Cov-2 infection and should not be used as the sole basis for treatment or other patient management decisions. A negative result may occur with  improper specimen collection/handling, submission of specimen other than nasopharyngeal swab, presence of viral mutation(s) within the areas targeted by this assay, and inadequate number of viral copies(<138 copies/mL). A negative result must be combined with clinical observations, patient  history, and epidemiological information. The expected result is Negative.  Fact Sheet for Patients:  EntrepreneurPulse.com.au  Fact Sheet for Healthcare Providers:  IncredibleEmployment.be  This test is no t yet approved or cleared by the [place] FDA and  has been authorized for detection and/or diagnosis of SARS-CoV-2 by FDA under an Emergency Use Authorization (EUA). This EUA will remain  in effect (meaning this test can be used) for the duration of the COVID-19 declaration under Section 564(b)(1) of the Act, 21 U.S.C.section 360bbb-3(b)(1), unless the authorization is terminated  or revoked sooner.       Influenza A by PCR NEGATIVE NEGATIVE Final   Influenza B by PCR NEGATIVE NEGATIVE Final    Comment: (NOTE) The Xpert Xpress SARS-CoV-2/FLU/RSV plus assay is intended as an aid in the diagnosis of influenza from Nasopharyngeal swab specimens and should not be used as a sole basis for treatment. Nasal washings and aspirates are unacceptable for Xpert Xpress SARS-CoV-2/FLU/RSV testing.  Fact Sheet for Patients: EntrepreneurPulse.com.au  Fact Sheet for Healthcare Providers: IncredibleEmployment.be  This test is not yet approved or cleared by the [place] FDA and has been authorized for detection and/or diagnosis of SARS-CoV-2 by FDA under an Emergency Use Authorization (EUA). This EUA will remain in effect (meaning this test can be used) for the duration of the COVID-19 declaration under Section 564(b)(1) of the Act, 21 U.S.C. section 360bbb-3(b)(1), unless the authorization is terminated or revoked.  Performed at [facility], [place] [address], [place], [place] [address]   MRSA Next Gen by PCR, Nasal     Status: None   Collection Time: 04/17/21  1:16 PM   Specimen: Nasal Mucosa; Nasal Swab  Result Value Ref Range Status   MRSA by PCR Next Gen NOT DETECTED NOT DETECTED  Final    Comment: (NOTE) The GeneXpert MRSA Assay (FDA approved for NASAL specimens only), is one component of a comprehensive MRSA colonization surveillance program. It is not intended to diagnose MRSA infection nor to guide or monitor treatment for MRSA infections. Test performance is not FDA approved in patients less than 11 years old. Performed at [facility], [place] [address], [place], [place] [address]          Radiology Studies: DG CHEST PORT 1 VIEW  Result Date: 04/23/2021 CLINICAL DATA:  Fever, shortness of breath. EXAM: PORTABLE CHEST 1 VIEW COMPARISON:  April 17, 2021. FINDINGS: Stable cardiomediastinal silhouette. No pneumothorax is noted. Left lung is clear. Stable stable postsurgical changes and opacity are noted in the right upper lobe. Mild right basilar subsegmental atelectasis or inflammation is noted. Bony thorax  is unremarkable. IMPRESSION: Stable postsurgical changes and abnormal opacity are noted in right upper lobe. Mild right basilar subsegmental atelectasis or inflammation is noted. Electronically Signed   By: Marijo Conception M.D.   On: 04/23/2021 09:36        Scheduled Meds:  amoxicillin-clavulanate  1 tablet Oral Q12H   vitamin C  1,000 mg Oral Daily   celecoxib  400 mg Oral BID   Chlorhexidine Gluconate Cloth  6 each Topical Daily   dexamethasone (DECADRON) injection  8 mg Intravenous Q12H   diazepam  5 mg Oral TID   fentaNYL  1 patch Transdermal Q72H   gabapentin  300 mg Oral BID WC   And   gabapentin  600 mg Oral QHS   ipratropium-albuterol  3 mL Nebulization BID   labetalol  100 mg Oral BID   lactulose  20 g Oral Daily   latanoprost  1 drop Both Eyes QHS   magic mouthwash  10 mL Oral TID   mouth rinse  15 mL Mouth Rinse BID   methadone  20 mg Oral Q12H   pantoprazole  40 mg Oral Daily   polyethylene glycol  17 g Oral BID   senna-docusate  2 tablet Oral BID   sertraline  100 mg Oral Daily   sodium chloride flush  10-40 mL  Intracatheter Q12H   Continuous Infusions:  sodium chloride Stopped (04/22/21 2150)   fentaNYL infusion INTRAVENOUS 200 mcg/hr (04/23/21 1000)   ketamine (KETALAR) Adult IV Infusion     methocarbamol (ROBAXIN) IV       LOS: 6 days    Time spent: 42 minutes spent on chart review, discussion with nursing staff, consultants, updating family and interview/physical exam; more than 50% of that time was spent in counseling and/or coordination of care.    Wrenley Sayed J [place] DO [facility] Available via Epic secure chat 7am-7pm After these hours, please refer to coverage provider listed on [email or web address] 04/23/2021, 11:37 AM

## 2021-04-23 NOTE — Progress Notes (Signed)
Palliative Care Progress Note  See initial consult note for hx and further narrative.  [name] has improved significantly in terms of pain and symptom control since last week. He is making meaningful and sustained progress towards being able to transition to a home regimen for pain related to metastatic lung cancer to his spine. Has been able to get OOB to [facility].His mood and even depression seem to be better.  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 Current Fentanyl Infusion at 277mcg/hr 1 X 44mcg bolus in past 8 hours Fentanyl transdermal 128mcg q72h Methadone 20mg  PO BID Decadron 8mg  BID Celebrex 400mg  BID Valium PO 5mg  TID for spasms Plan to stop Ketamine today after XRT-will titrate his fentanyl infusion for pain. Constipation, opioid induced Relistor X1, then resume oral regimen Right Upper Lobe Maligancy, Horner's syndrome, Thoracic outlet pain Mostly resolved now with XRT and pain regimen CXR nothing acute-lungs were mostly clear on auscultation today and better air movement in general-low level stridor anterior right upper chest/upper airway-unchanged. No fevers No signs of aspiration Dental Infection,now chronic Augmentin -indefinitely or until tooth can be extracted depending on his condition and ability to undergo a dental procedure.  Plan is for discharge home with [facility] on 8/17 after XRT. Will need Fentanyl CADD PCA and DME-including O2.  Lane Hacker, DO Palliative Medicine  Time: 35 min Greater than 50%  of this time was spent counseling and coordinating care related to the above assessment and plan.

## 2021-04-23 NOTE — Progress Notes (Signed)
178ml of 5mg /ml of ketamine wasted in stericycle, witnessed by Shayne Alken, RN.

## 2021-04-23 NOTE — TOC Progression Note (Signed)
Transition of Care [facility]) - Progression Note    Patient Details  Name: Christopher Carter MRN: [id number] Date of Birth: 01-30-1961  [facility] CM/SW Contact  Leeroy Cha, RN Phone Number: 04/23/2021, 9:49 AM  Clinical Narrative:    Tct -wife does want [facility].   Expected Discharge Plan: [place] Barriers to Discharge: Continued Medical Work up  Expected Discharge Plan and Services Expected Discharge Plan: [place]   Discharge Planning Services: CM Consult   Living arrangements for the past 2 months: Single Family Home Expected Discharge Date:  (unknown)                                     Social Determinants of Health (SDOH) Interventions    Readmission Risk Interventions No flowsheet data found.

## 2021-04-24 ENCOUNTER — Inpatient Hospital Stay (HOSPITAL_COMMUNITY): Payer: 59

## 2021-04-24 ENCOUNTER — Ambulatory Visit
Admission: RE | Admit: 2021-04-24 | Discharge: 2021-04-24 | Disposition: A | Discharge status: Home / Self Care | Payer: 59 | Source: Ambulatory Visit | Attending: Radiation Oncology | Admitting: Radiation Oncology

## 2021-04-24 DIAGNOSIS — J9621 Acute and chronic respiratory failure with hypoxia: Secondary | ICD-10-CM

## 2021-04-24 DIAGNOSIS — A419 Sepsis, unspecified organism: Secondary | ICD-10-CM | POA: Diagnosis not present

## 2021-04-24 LAB — CK ISOENZYMES
CK-BB: 0 %
CK-MB: 0 % (ref 0–3)
CK-MM: 100 % (ref 97–100)
Creatine Kinase-Total: 23 U/L — ABNORMAL LOW (ref 41–331)
Macro Type 1: 0 %
Macro Type 2: 0 %

## 2021-04-24 LAB — CBC
HCT: 26.8 % — ABNORMAL LOW (ref 39.0–52.0)
Hemoglobin: 8 g/dL — ABNORMAL LOW (ref 13.0–17.0)
MCH: 28.9 pg (ref 26.0–34.0)
MCHC: 29.9 g/dL — ABNORMAL LOW (ref 30.0–36.0)
MCV: 96.8 fL (ref 80.0–100.0)
Platelets: 161 10*3/uL (ref 150–400)
RBC: 2.77 MIL/uL — ABNORMAL LOW (ref 4.22–5.81)
RDW: 16.3 % — ABNORMAL HIGH (ref 11.5–15.5)
WBC: 10.4 10*3/uL (ref 4.0–10.5)
nRBC: 0 % (ref 0.0–0.2)

## 2021-04-24 LAB — COMPREHENSIVE METABOLIC PANEL
ALT: 17 U/L (ref 0–44)
AST: 14 U/L — ABNORMAL LOW (ref 15–41)
Albumin: 2.8 g/dL — ABNORMAL LOW (ref 3.5–5.0)
Alkaline Phosphatase: 67 U/L (ref 38–126)
Anion gap: 6 (ref 5–15)
BUN: 11 mg/dL (ref 6–20)
CO2: 31 mmol/L (ref 22–32)
Calcium: 8.9 mg/dL (ref 8.9–10.3)
Chloride: 102 mmol/L (ref 98–111)
Creatinine, Ser: 0.44 mg/dL — ABNORMAL LOW (ref 0.61–1.24)
GFR, Estimated: >60 mL/min (ref >60)
Glucose, Bld: 147 mg/dL — ABNORMAL HIGH (ref 70–99)
Potassium: 4.4 mmol/L (ref 3.5–5.1)
Sodium: 139 mmol/L (ref 135–145)
Total Bilirubin: 0.5 mg/dL (ref 0.3–1.2)
Total Protein: 6 g/dL — ABNORMAL LOW (ref 6.5–8.1)

## 2021-04-24 LAB — D-DIMER, QUANTITATIVE: D-Dimer, Quant: 8.36 ug/mL-FEU — ABNORMAL HIGH (ref 0.00–0.50)

## 2021-04-24 IMAGING — DX DG CHEST 1V PORT
1 series · 1 of 1 positions shown · non-contrast
Comparison: [DATE].

CLINICAL DATA: Shortness of breath.

EXAM:
PORTABLE CHEST 1 VIEW

[chest ap]
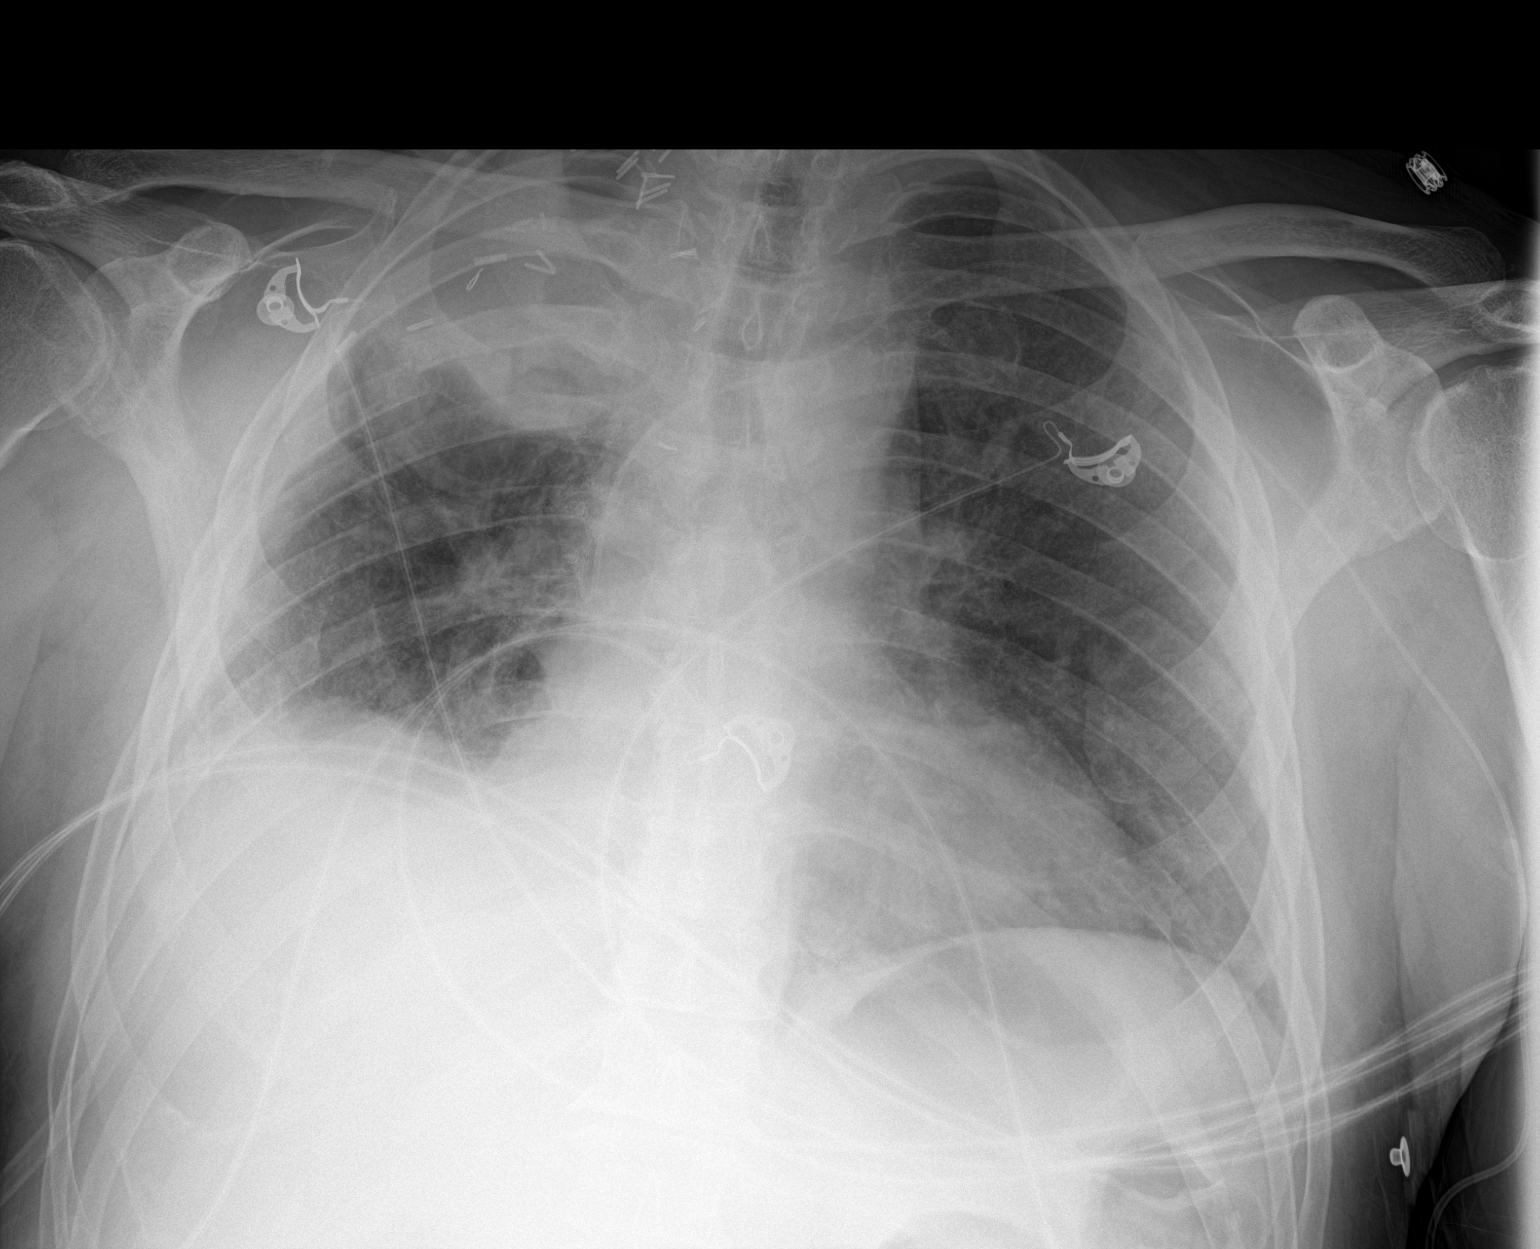

[1 of 1 positions shown; findings below may reference images not displayed]

FINDINGS: Stable cardiomediastinal silhouette. Left-sided PICC line is
unchanged in position. No pneumothorax is noted. Left lung is clear.
Stable postsurgical changes and opacity are noted in the right upper
lobe. Stable right basilar subsegmental atelectasis is noted. Bony
thorax is unremarkable.
IMPRESSION: Stable right lung opacities compared to prior exam. No significant
change compared to prior exam.

## 2021-04-24 MED ORDER — MAGNESIUM OXIDE -MG SUPPLEMENT 400 (240 MG) MG PO TABS
400.0000 mg | ORAL_TABLET | Freq: Every day | ORAL | Status: DC
  Administered 2021-04-25: 400 mg via ORAL
  Filled 2021-04-24: qty 1

## 2021-04-24 MED ORDER — DEXAMETHASONE 2 MG PO TABS
8.0000 mg | ORAL_TABLET | Freq: Two times a day (BID) | ORAL | Status: DC
  Administered 2021-04-24 – 2021-04-25 (×3): 8 mg via ORAL
  Filled 2021-04-24 (×3): qty 4

## 2021-04-24 MED ORDER — FUROSEMIDE 10 MG/ML IJ SOLN
40.0000 mg | Freq: Once | INTRAMUSCULAR | Status: AC
  Administered 2021-04-24: 40 mg via INTRAVENOUS
  Filled 2021-04-24: qty 4

## 2021-04-24 MED ORDER — ENOXAPARIN SODIUM 80 MG/0.8ML IJ SOSY
80.0000 mg | PREFILLED_SYRINGE | Freq: Two times a day (BID) | INTRAMUSCULAR | Status: DC
  Administered 2021-04-24 – 2021-04-25 (×3): 80 mg via SUBCUTANEOUS
  Filled 2021-04-24 (×4): qty 0.8

## 2021-04-24 MED ORDER — METHADONE HCL 10 MG PO TABS
10.0000 mg | ORAL_TABLET | Freq: Every day | ORAL | Status: DC
  Administered 2021-04-24: 10 mg via ORAL
  Filled 2021-04-24: qty 1

## 2021-04-24 MED ORDER — SENNOSIDES-DOCUSATE SODIUM 8.6-50 MG PO TABS
2.0000 | ORAL_TABLET | Freq: Two times a day (BID) | ORAL | Status: DC
  Administered 2021-04-24 – 2021-04-25 (×3): 2 via ORAL
  Filled 2021-04-24 (×3): qty 2

## 2021-04-24 MED ORDER — APIXABAN 5 MG PO TABS: 5.0000 mg | ORAL_TABLET | Freq: Two times a day (BID) | ORAL | Status: DC

## 2021-04-24 MED ORDER — METHADONE HCL 10 MG PO TABS
20.0000 mg | ORAL_TABLET | Freq: Every morning | ORAL | Status: DC
  Administered 2021-04-25: 20 mg via ORAL
  Filled 2021-04-24: qty 2

## 2021-04-24 MED ORDER — LABETALOL HCL 100 MG PO TABS
100.0000 mg | ORAL_TABLET | Freq: Three times a day (TID) | ORAL | Status: DC
  Administered 2021-04-25 (×2): 100 mg via ORAL
  Filled 2021-04-24 (×2): qty 1

## 2021-04-24 MED ORDER — POLYETHYLENE GLYCOL 3350 17 G PO PACK
17.0000 g | PACK | Freq: Two times a day (BID) | ORAL | Status: DC
  Administered 2021-04-24 – 2021-04-25 (×2): 17 g via ORAL
  Filled 2021-04-24 (×3): qty 1

## 2021-04-24 MED ORDER — CELECOXIB 200 MG PO CAPS
200.0000 mg | ORAL_CAPSULE | Freq: Two times a day (BID) | ORAL | Status: DC
  Administered 2021-04-24 – 2021-04-25 (×3): 200 mg via ORAL
  Filled 2021-04-24 (×4): qty 1

## 2021-04-24 NOTE — Progress Notes (Signed)
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  Patient with recent hospitalization 7/10 - 8/2 with respiratory failure, recurrent hemoptysis with associated pneumonia right-sided pleural fluid collection s/p drain and extended antibiotic course.  IR drain was removed on 8/1; and patient was discharged on 14-day course of antibiotics.  In the ED, CT angiogram chest with slight decrease of previously seen fluid collection, scattered groundglass opacities, increase in possible left pleural tumor with possible superimposed infection.  Patient was started on broad-spectrum antibiotics.  TRH consulted for further evaluation and management.   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 every 12 hours --Gabapentin 300 mg twice daily, 600 Robaxin 500 mg IV every 6 hours as needed muscle spasms mg nightly --Celebrex 40 mg p.o. twice daily --Decadron 8 mg PO q12h --Receiving XRT to [place] for 5 treatments; last on 8/17  Acute hypoxic respiratory  failure, concerning for pulmonary embolism Overnight, patient's oxygen demand has increased, now requiring 15 L NRB to maintain SPO2.  Patient with also mild tachycardia, likely subdued due to his home labetalol use for hypertension.  D-dimer elevated at 8.36. --Start treatment dose Lovenox --CT angiogram chest: Pending --Vascular duplex ultrasound bilateral lower extremities: Pending --Continue supple oxygen, maintain SPO2 greater than 92%, currently on 15 L NRB with SPO2 93-94% at rest  Non-small cell lung cancer with metastasis thoracic/lumbar spine with cauda equina syndrome Patient follows with medical oncology outpatient, Dr. Earlie Server.  Patient is status post right upper lobe lobectomy and node dissection by CTS, Dr. Koleen Nimrod on 5/16.  Currently undergoing XRT.  Sepsis: Ruled out Initially presenting with altered mental status, fever.  Thought to initially be recurrent pneumonia.  Was started on empiric antibiotics and completed 3-day course now discontinued.  Etiology of his fever likely related to progression of malignancy.  Really continues on Augmentin, likely indefinitely for suspected dental infection.  Culture on admission with no growth x5 days.  Previous sputum culture on 7/25 with normal oral flora.  Previous blood cultures unrevealing and pleural body fluid culture with no organisms seen. --Continue supportive care  Anemia of chronic disease due to malignancy Hemoglobin 8.0 on 8/16, stable.  Urinary retention --continue Foley catheter  Dental pain/infection During previous hospitalization, patient was seen by in-house dental surgery who recommended antibiotics and outpatient follow-up with his orthodontist.  Initial plan was for root canal treatment on 03/19/2021 but this was postponed it due to his intractable pain and events as above. --Continue Augmentin BID; likely indefinitely  Essential hypertension --Continue labetalol 100 mg p.o. twice daily --Continue irbesartan  75mg  PO daily --Hydralazine 25mg  PO q6h for  SBP >180 or DBP >110 --continue to monitor BP closely  Type 2 diabetes mellitus Hemoglobin A1c 6.5, well controlled.  Anxiety/depression --Zoloft 100 mg p.o. daily --Valium 5 mg p.o. 3 times daily --Valium 5 mg IV every 4 hours as needed anxiety/muscle spasms  Constipation Patient with large BM yesterday. --MiraLAX twice daily --Senokot 2 tabs p.o. twice daily  GERD: Continue PPI   DVT prophylaxis: Place and maintain sequential compression device Start: 04/17/21 1202 SCDs Start: 04/17/21 1020, Lovenox   Code Status: DNR Family Communication: Updated patient's spouse and daughter  who is present at bedside this morning.  Disposition Plan:  Level of care: Stepdown Status is: Inpatient  Remains inpatient appropriate because:Ongoing diagnostic testing needed not appropriate for outpatient work up, Unsafe d/c plan, IV treatments appropriate due to intensity of illness or inability to take PO, and Inpatient level of care appropriate due to severity of illness  Dispo: The patient is from: Home              Anticipated d/c is to:  Home with hospice once medically stable and pain controlled              Patient currently is not medically stable to d/c.   Difficult to place patient No   Consultants:  Palliative Care PCCM - signed off 8/11; re-consulted 8/16  Procedures:  none  Antimicrobials:  Vancomycin 8/9 - 8/10 Cefepime 8/9 - 8/11 Metronidazole 8/9 - 8/10 Augmentin 8/12>>   Subjective: Patient seen examined bedside, resting comfortably.  Spouse present.  Ketamine drip stopped yesterday.  Overnight, patient's oxygen requirement has increased with mild tachycardia.  Repeat chest x-ray relatively unchanged.  Given 1 dose of IV Lasix.  D-dimer elevated at 8.36, obtaining CT angiogram chest and vascular duplex ultrasound lower extremities.  Started on Lovenox treatment dose for concern of presumed PE.  Updated patient and daughter  who is present at bedside this afternoon.  Also discussed with PCCM who will see the patient in reconsultation.  Patient reports large bowel movement yesterday.  No other questions or concerns at this time.  Denies headache, no chest pain, no palpitations, no abdominal pain.  No other acute concerns overnight other than increased oxygen needs per nursing staff.   Objective: Vitals:   04/24/21 0943 04/24/21 1200 04/24/21 1215 04/24/21 1219  BP:  112/65    Pulse:  96    Resp:  13    Temp:  98.3 F (36.8 C)  98.1 F (36.7 C)  TempSrc:  Axillary  Axillary  SpO2: 91%  (!) 82% 90%  Weight:      Height:        Intake/Output Summary (Last 24 hours) at 04/24/2021 1304 Last data filed at 04/24/2021 1200 Gross per 24 hour  Intake 474.75 ml  Output 4075 ml  Net -3600.25 ml   Filed Weights   04/17/21 0241 04/17/21 1210  Weight: 80.7 kg 80.1 kg    Examination:  General exam: Appears calm and comfortable, chronically ill in appearance Respiratory system: Clear to auscultation. Respiratory effort normal.  On 15 L NRB with SPO2 93-94% on room air Cardiovascular system: S1 & S2 heard, RRR. No JVD, murmurs, rubs, gallops or clicks. No pedal edema. Gastrointestinal system: Abdomen is nondistended, soft and nontender. No organomegaly or masses felt. Normal bowel sounds heard. Central nervous system: Alert and oriented. No focal neurological deficits. Extremities: Moves all extremities independently Skin: No rashes, lesions or ulcers Psychiatry: Judgement and insight appear normal. Mood & affect appropriate.  Data Reviewed: I have personally reviewed following labs and imaging studies  CBC: Recent Labs  Lab 04/18/21 0305 04/24/21 0636  WBC 6.9 10.4  HGB 9.2* 8.0*  HCT 30.6* 26.8*  MCV 96.8 96.8  PLT 203 580   Basic Metabolic Panel: Recent Labs  Lab 04/18/21 0305 04/24/21 0636  NA 139 139  K 4.3 4.4  CL 102 102  CO2 28 31  GLUCOSE 110* 147*  BUN 11 11  CREATININE 0.48*  0.44*  CALCIUM 8.8* 8.9   GFR: Estimated Creatinine Clearance: 103.9 mL/min (A) (by C-G formula based on SCr of 0.44 mg/dL (L)). Liver Function Tests: Recent Labs  Lab 04/18/21 0305 04/24/21 0636  AST 11* 14*  ALT 11 17  ALKPHOS 71 67  BILITOT 0.7 0.5  PROT 5.9* 6.0*  ALBUMIN 2.8* 2.8*   No results for input(s): LIPASE, AMYLASE in the last 168 hours. No results for input(s): AMMONIA in the last 168 hours. Coagulation Profile: Recent Labs  Lab 04/18/21 0622  INR 1.1   Cardiac Enzymes: Recent Labs  Lab 04/19/21 0300  CKMB 0   BNP (last 3 results) No results for input(s): PROBNP in the last 8760 hours. HbA1C: No results for input(s): HGBA1C in the last 72 hours. CBG: Recent Labs  Lab 04/18/21 0816 04/18/21 1135 04/18/21 1727 04/18/21 2133 04/19/21 0732  GLUCAP 134* 133* 180* 198* 190*   Lipid Profile: No results for input(s): CHOL, HDL, LDLCALC, TRIG, CHOLHDL, LDLDIRECT in the last 72 hours. Thyroid Function Tests: No results for input(s): TSH, T4TOTAL, FREET4, T3FREE, THYROIDAB in the last 72 hours. Anemia Panel: No results for input(s): VITAMINB12, FOLATE, FERRITIN, TIBC, IRON, RETICCTPCT in the last 72 hours. Sepsis Labs: Recent Labs  Lab 04/18/21 0558  PROCALCITON <0.10    Recent Results (from the past 240 hour(s))  Culture, blood (Routine x 2)     Status: None   Collection Time: 04/17/21  3:23 AM   Specimen: BLOOD  Result Value Ref Range Status   Specimen Description   Final    BLOOD SPECIMEN SOURCE NOT MARKED ON REQUISITION Performed at [facility] [address], [place], [place] [address]    Special Requests   Final    BOTTLES DRAWN AEROBIC AND ANAEROBIC Blood Culture adequate volume Performed at [facility] [address] [place], [place] [address]    Culture   Final    NO GROWTH 5 DAYS Performed at [facility], [place] [address], [place], [place] [address]    Report Status 04/22/2021 FINAL   Final  SARS CORONAVIRUS 2 (TAT 6-24 HRS) Nasopharyngeal Nasopharyngeal Swab     Status: None   Collection Time: 04/17/21  5:42 AM   Specimen: Nasopharyngeal Swab  Result Value Ref Range Status   SARS Coronavirus 2 NEGATIVE NEGATIVE Final    Comment: (NOTE) SARS-CoV-2 target nucleic acids are NOT DETECTED.  The SARS-CoV-2 RNA is generally detectable in upper and lower respiratory specimens during the acute phase of infection. Negative results do not preclude SARS-CoV-2 infection, do not rule out co-infections with other pathogens, and should not be used as the sole basis for treatment or other patient management decisions. Negative results must be combined with clinical observations, patient history, and epidemiological information. The expected result is Negative.  Fact Sheet for Patients: SugarRoll.be  Fact Sheet for Healthcare Providers: https://www.woods-mathews.com/  This test is not yet approved or cleared by the [place] FDA and  has been authorized for detection and/or diagnosis  of SARS-CoV-2 by FDA under an Emergency Use Authorization (EUA). This EUA will remain  in effect (meaning this test can be used) for the duration of the COVID-19 declaration under Se ction 564(b)(1) of the Act, 21 U.S.C. section 360bbb-3(b)(1), unless the authorization is terminated or revoked sooner.  Performed at [facility], [place] [address], [place], [place] [address]   Resp Panel by RT-PCR (Flu A&B, Covid) Nasopharyngeal Swab     Status: None   Collection Time: 04/17/21  8:54 AM   Specimen: Nasopharyngeal Swab; Nasopharyngeal(NP) swabs in vial transport medium  Result Value Ref Range Status   SARS Coronavirus 2 by RT PCR NEGATIVE NEGATIVE Final    Comment: (NOTE) SARS-CoV-2 target nucleic acids are NOT DETECTED.  The SARS-CoV-2 RNA is generally detectable in upper respiratory specimens during the acute phase of infection. The  lowest concentration of SARS-CoV-2 viral copies this assay can detect is 138 copies/mL. A negative result does not preclude SARS-Cov-2 infection and should not be used as the sole basis for treatment or other patient management decisions. A negative result may occur with  improper specimen collection/handling, submission of specimen other than nasopharyngeal swab, presence of viral mutation(s) within the areas targeted by this assay, and inadequate number of viral copies(<138 copies/mL). A negative result must be combined with clinical observations, patient history, and epidemiological information. The expected result is Negative.  Fact Sheet for Patients:  EntrepreneurPulse.com.au  Fact Sheet for Healthcare Providers:  IncredibleEmployment.be  This test is no t yet approved or cleared by the [place] FDA and  has been authorized for detection and/or diagnosis of SARS-CoV-2 by FDA under an Emergency Use Authorization (EUA). This EUA will remain  in effect (meaning this test can be used) for the duration of the COVID-19 declaration under Section 564(b)(1) of the Act, 21 U.S.C.section 360bbb-3(b)(1), unless the authorization is terminated  or revoked sooner.       Influenza A by PCR NEGATIVE NEGATIVE Final   Influenza B by PCR NEGATIVE NEGATIVE Final    Comment: (NOTE) The Xpert Xpress SARS-CoV-2/FLU/RSV plus assay is intended as an aid in the diagnosis of influenza from Nasopharyngeal swab specimens and should not be used as a sole basis for treatment. Nasal washings and aspirates are unacceptable for Xpert Xpress SARS-CoV-2/FLU/RSV testing.  Fact Sheet for Patients: EntrepreneurPulse.com.au  Fact Sheet for Healthcare Providers: IncredibleEmployment.be  This test is not yet approved or cleared by the [place] FDA and has been authorized for detection and/or diagnosis of SARS-CoV-2 by FDA under  an Emergency Use Authorization (EUA). This EUA will remain in effect (meaning this test can be used) for the duration of the COVID-19 declaration under Section 564(b)(1) of the Act, 21 U.S.C. section 360bbb-3(b)(1), unless the authorization is terminated or revoked.  Performed at [facility], [place] [address], [place], [place] [address]   MRSA Next Gen by PCR, Nasal     Status: None   Collection Time: 04/17/21  1:16 PM   Specimen: Nasal Mucosa; Nasal Swab  Result Value Ref Range Status   MRSA by PCR Next Gen NOT DETECTED NOT DETECTED Final    Comment: (NOTE) The GeneXpert MRSA Assay (FDA approved for NASAL specimens only), is one component of a comprehensive MRSA colonization surveillance program. It is not intended to diagnose MRSA infection nor to guide or monitor treatment for MRSA infections. Test performance is not FDA approved in patients less than 3 years old. Performed at [facility], [place]  [address], [place], [place] [address]          Radiology Studies: DG CHEST PORT 1 VIEW  Result Date: 04/24/2021 CLINICAL DATA:  Shortness of breath. EXAM: PORTABLE CHEST 1 VIEW COMPARISON:  April 23, 2021. FINDINGS: Stable cardiomediastinal silhouette. Left-sided PICC line is unchanged in position. No pneumothorax is noted. Left lung is clear. Stable postsurgical changes and opacity are noted in the right upper lobe. Stable right basilar subsegmental atelectasis is noted. Bony thorax is unremarkable. IMPRESSION: Stable right lung opacities compared to prior exam. No significant change compared to prior exam. Electronically Signed   By: Marijo Conception M.D.   On: 04/24/2021 08:44   DG CHEST PORT 1 VIEW  Result Date: 04/23/2021 CLINICAL DATA:  Fever, shortness of breath. EXAM: PORTABLE CHEST 1 VIEW COMPARISON:  April 17, 2021. FINDINGS: Stable cardiomediastinal silhouette. No pneumothorax is noted. Left lung is clear. Stable stable postsurgical  changes and opacity are noted in the right upper lobe. Mild right basilar subsegmental atelectasis or inflammation is noted. Bony thorax is unremarkable. IMPRESSION: Stable postsurgical changes and abnormal opacity are noted in right upper lobe. Mild right basilar subsegmental atelectasis or inflammation is noted. Electronically Signed   By: Marijo Conception M.D.   On: 04/23/2021 09:36        Scheduled Meds:  amoxicillin-clavulanate  1 tablet Oral Q12H   vitamin C  1,000 mg Oral Daily   celecoxib  200 mg Oral BID   Chlorhexidine Gluconate Cloth  6 each Topical Daily   dexamethasone  8 mg Oral Q12H   diazepam  5 mg Oral TID   enoxaparin (LOVENOX) injection  80 mg Subcutaneous Q12H   fentaNYL  1 patch Transdermal Q72H   gabapentin  300 mg Oral BID WC   And   gabapentin  600 mg Oral QHS   ipratropium-albuterol  3 mL Nebulization BID   irbesartan  75 mg Oral Daily   labetalol  100 mg Oral BID   latanoprost  1 drop Both Eyes QHS   magic mouthwash  10 mL Oral TID   magnesium oxide  800 mg Oral Daily   mouth rinse  15 mL Mouth Rinse BID   methadone  20 mg Oral Q12H   pantoprazole  40 mg Oral Daily   polyethylene glycol  17 g Oral BID   senna-docusate  2 tablet Oral BID   sertraline  100 mg Oral Daily   sodium chloride flush  10-40 mL Intracatheter Q12H   Continuous Infusions:  sodium chloride Stopped (04/22/21 2150)   fentaNYL infusion INTRAVENOUS 150 mcg/hr (04/24/21 0800)   methocarbamol (ROBAXIN) IV       LOS: 7 days    Time spent: 47 minutes spent on chart review, discussion with nursing staff, consultants, updating family and interview/physical exam; more than 50% of that time was spent in counseling and/or coordination of care.    Christopher Chipley J [place] DO [facility] Available via Epic secure chat 7am-7pm After these hours, please refer to coverage provider listed on [email or web address] 04/24/2021, 1:04 PM

## 2021-04-24 NOTE — Progress Notes (Signed)
BLE venous duplex has been completed.  Preliminary findings given to Dr. Silas Flood.  Results can be found under chart review under CV PROC. 04/24/2021 3:35 PM Niasia Lanphear RVT, RDMS

## 2021-04-24 NOTE — Consult Note (Signed)
NAME:  Christopher Carter, MRN:  [id number], DOB:  09-29-1960, LOS: 7 ADMISSION DATE:  04/17/2021, CONSULTATION DATE:  04/24/2021 REFERRING MD:  Dr. [place] CHIEF COMPLAINT:  Hypoxia    History of Present Illness:  Christopher Carter is a 60 y.o. male with a PMH significant for non-small cell lung cancer with mets to thoracic/lumbar spine with cauda equina syndrome, HTN, depression, and anxiety who presented to the ED for intractable pain, fever, and AMS. Patient was admitted per [facility] with Palliative care consult for treatment of intractable pain and concern for recurrent infections.   During hospitilization thus far pain has been controlled with Fentanyl Infusion at 222mcg/hr, Fentanyl transdermal 122mcg q72h, Methadone $RemoveBeforeD'20mg'avcednyHBAqtoA$  PO BID, Decadron $RemoveBefor'8mg'YRtOWRCeKOhj$  BID, Celebrex $RemoveBefor'400mg'yQQgPLuuJVpW$  BID, Valium PO $RemoveB'5mg'AdZCDmnz$  TID for spasms and IV Ketamine infusion currently being managed per Palliative care.   Overnight of 8/16 patient was seen with increasing supplemental oxygen demand with uptitration from 4LNC to 15L NRB. Given new oxygen demand PCCM was consulted for pulmonary consult.  Pertinent  Medical History  Non-small cell lung cancer with mets to thoracic/lumbar spine with cauda equina syndrome HTN Depression Anxiety  Significant Hospital Events:  8/9 Admitted for intractable pain 8/16 worsening supplemental oxygen demand   Interim History / Subjective:  Sedated currently but appears comfortable Daughter at bedside and updated   Objective   Blood pressure 112/65, pulse 96, temperature 98.3 F (36.8 C), temperature source Axillary, resp. rate 13, height $RemoveBe'5\' 10"'WDeFGFeLY$  (1.778 m), weight 80.1 kg, SpO2 90 %.        Intake/Output Summary (Last 24 hours) at 04/24/2021 1251 Last data filed at 04/24/2021 1200 Gross per 24 hour  Intake 474.75 ml  Output 4075 ml  Net -3600.25 ml   Filed Weights   04/17/21 0241 04/17/21 1210  Weight: 80.7 kg 80.1 kg    Examination: General: Chronically ill appearing middle aged male  lying in bed on NRB, in NAD HEENT: [place]/AT, MM pink/moist, PERRL,  Neuro: Sleepy but will open eyes to verbal and physical stimuli  CV: s1s2 regular rate and rhythm, no murmur, rubs, or gallops,  PULM:  snoring resp bilateral, no increased work of breathing  GI: soft, bowel sounds active in all 4 quadrants, non-tender, non-distended Extremities: warm/dry, no edema  Skin: no rashes or lesions  Resolved Hospital Problem list     Assessment & Plan:  Non-small cell lung cancer with mets to thoracic/lumbar spine with cauda equina syndrome Acute on chronic Hypoxic Respiratory Failure  Concern for acute PE in the setting of hypercoagulability secondary to malignancy  -Worsening dyspnea and increased supplemental oxygen requirement overnight if 8/16. Patients oxygen requirement went from 4L [place] to 15L NRB -Chest xray reviewed with no acute changes compared to prior and no acute concerns for aspiration -Primary team obtained D-dimer which was elevated at 8.36 P: Continue supplemental oxygen  Head of bed elevated 30 degrees Ensure adequate pulmonary hygiene  CTA chest pending to assess for PE but patient is likely not a candidate for tPA Comfort outwei   Best Practice:  Per Primary   Labs   CBC: Recent Labs  Lab 04/18/21 0305 04/24/21 0636  WBC 6.9 10.4  HGB 9.2* 8.0*  HCT 30.6* 26.8*  MCV 96.8 96.8  PLT 203 902    Basic Metabolic Panel: Recent Labs  Lab 04/18/21 0305 04/24/21 0636  NA 139 139  K 4.3 4.4  CL 102 102  CO2 28 31  GLUCOSE 110* 147*  BUN 11 11  CREATININE  0.48* 0.44*  CALCIUM 8.8* 8.9   GFR: Estimated Creatinine Clearance: 103.9 mL/min (A) (by C-G formula based on SCr of 0.44 mg/dL (L)). Recent Labs  Lab 04/18/21 0305 04/18/21 0558 04/24/21 0636  PROCALCITON  --  <0.10  --   WBC 6.9  --  10.4    Liver Function Tests: Recent Labs  Lab 04/18/21 0305 04/24/21 0636  AST 11* 14*  ALT 11 17  ALKPHOS 71 67  BILITOT 0.7 0.5  PROT 5.9* 6.0*   ALBUMIN 2.8* 2.8*   No results for input(s): LIPASE, AMYLASE in the last 168 hours. No results for input(s): AMMONIA in the last 168 hours.  ABG    Component Value Date/Time   PHART 7.458 (H) 02/26/2021 0630   PCO2ART 40.5 02/26/2021 0630   PO2ART 79 (L) 02/26/2021 0630   HCO3 28.7 (H) 02/26/2021 0630   TCO2 30 02/26/2021 0630   ACIDBASEDEF 1.3 01/18/2021 1504   O2SAT 96.0 02/26/2021 0630     Coagulation Profile: Recent Labs  Lab 04/18/21 0622  INR 1.1    Cardiac Enzymes: Recent Labs  Lab 04/19/21 0300  CKMB 0    HbA1C: Hgb A1c MFr Bld  Date/Time Value Ref Range Status  03/23/2021 05:32 AM 6.5 (H) 4.8 - 5.6 % Final    Comment:    (NOTE) Pre diabetes:          5.7%-6.4%  Diabetes:              >6.4%  Glycemic control for   <7.0% adults with diabetes     CBG: Recent Labs  Lab 04/18/21 0816 04/18/21 1135 04/18/21 1727 04/18/21 2133 04/19/21 0732  GLUCAP 134* 133* 180* 198* 190*    Review of Systems:   Please see the history of present illness. 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   Surgical History:   Past Surgical History:  Procedure Laterality Date   ANKLE FRACTURE SURGERY Right    BRONCHIAL BIOPSY  01/05/2021   Procedure: BRONCHIAL BIOPSIES;  Surgeon: Collene Gobble, MD;  Location: [place];  Service: Cardiopulmonary;;   BRONCHIAL BRUSHINGS  01/05/2021   Procedure: BRONCHIAL BRUSHINGS;  Surgeon: Collene Gobble, MD;  Location: [place];  Service: Cardiopulmonary;;   BRONCHIAL NEEDLE ASPIRATION BIOPSY  01/05/2021   Procedure: BRONCHIAL NEEDLE ASPIRATION BIOPSIES;  Surgeon: Collene Gobble, MD;  Location: [place];  Service: Cardiopulmonary;;   COLON SURGERY  2017   bowel obstruction surgery per pt    ENDOBRONCHIAL ULTRASOUND N/A 01/05/2021   Procedure: ENDOBRONCHIAL ULTRASOUND;  Surgeon:  Collene Gobble, MD;  Location: [place];  Service: Cardiopulmonary;  Laterality: N/A;   INTERCOSTAL NERVE BLOCK Right 01/22/2021   Procedure: INTERCOSTAL NERVE BLOCK;  Surgeon: [name], MD;  Location: [place];  Service: Thoracic;  Laterality: Right;   LAPAROSCOPY N/A 04/25/2017   Procedure: LAPAROSCOPY ASSISTED ILEO-CECECTOMY WITH SMALL BOWEL RESECTION WITH ANASTOMOSIS;  Surgeon: Excell Seltzer, MD;  Location: [facility];  Service: General;  Laterality: N/A;   LYMPH NODE DISSECTION Right 01/22/2021   Procedure: LYMPH NODE DISSECTION;  Surgeon: [name], MD;  Location: [place];  Service: Thoracic;  Laterality: Right;   THORACOTOMY Right 01/22/2021   Procedure: THORACOTOMY;  Surgeon: [name], MD;  Location: [place];  Service: Thoracic;  Laterality: Right;   VIDEO ASSISTED THORACOSCOPY (VATS)/ LOBECTOMY Right 01/22/2021  Procedure: VIDEO ASSISTED THORACOSCOPY (VATS)/RIGHT UPPER LOBECTOMY;  Surgeon: [name], MD;  Location: [place];  Service: Thoracic;  Laterality: Right;   VIDEO BRONCHOSCOPY N/A 01/05/2021   Procedure: VIDEO BRONCHOSCOPY WITH FLUORO;  Surgeon: Collene Gobble, MD;  Location: [place];  Service: Cardiopulmonary;  Laterality: N/A;   VIDEO BRONCHOSCOPY N/A 03/19/2021   Procedure: VIDEO BRONCHOSCOPY WITHOUT FLUORO;  Surgeon: [name], DO;  Location: [place] [facility];  Service: Endoscopy;  Laterality: N/A;  need cryo probe available     Social History:   reports that he has never smoked. He has never used smokeless tobacco. He reports current alcohol use of about 22.0 standard drinks per week. He reports that he does not use drugs.   Family History:  His family history is not on file.   Allergies Allergies  Allergen Reactions   Oxycodone Hcl Other (See Comments)    Pt reported "seeing things" and " feeling unusual."   Bupropion Nausea And Vomiting     Home Medications  Prior to Admission medications   Medication Sig Start  Date End Date Taking? Authorizing Provider  acetaminophen (TYLENOL) 500 MG tablet Take 500 mg by mouth every 8 (eight) hours as needed for moderate pain or headache.   Yes [provider]  amoxicillin-clavulanate (AUGMENTIN) 875-125 MG tablet Take 1 tablet by mouth every 12 (twelve) hours for 14 days. 04/10/21 04/24/21 Yes Donne Hazel, MD  Ascorbic Acid (VITAMIN C) 1000 MG tablet Take 1,000 mg by mouth daily.   Yes [provider]  benzonatate (TESSALON) 200 MG capsule Take 1 capsule (200 mg total) by mouth 2 (two) times daily. Patient taking differently: Take 200 mg by mouth 2 (two) times daily as needed for cough. 04/08/21  Yes Acquanetta Chain, DO  celecoxib (CELEBREX) 200 MG capsule Take 1 capsule (200 mg total) by mouth 2 (two) times daily. 04/08/21  Yes Lane Hacker L, DO  diazepam (VALIUM) 10 MG tablet Take 0.5-1 tablets (5-10 mg total) by mouth daily as needed for anxiety (30 minutes PRIOR to Radiation). 04/08/21  Yes Acquanetta Chain, DO  diazepam (VALIUM) 2 MG tablet Take 1 tablet (2 mg total) by mouth every 12 (twelve) hours. 04/11/21  Yes Acquanetta Chain, DO  fentaNYL (DURAGESIC) 100 MCG/HR Place 1 patch onto the skin every 3 (three) days. 04/12/21 05/12/21 Yes Acquanetta Chain, DO  fentaNYL (DURAGESIC) 25 MCG/HR This is in addition to the 100 mcg patch for total of 125 mcg q72 hours. 04/11/21  Yes Lane Hacker L, DO  ferrous sulfate 325 (65 FE) MG tablet Take 1 tablet (325 mg total) by mouth daily with breakfast. 04/11/21 05/11/21 Yes Donne Hazel, MD  gabapentin (NEURONTIN) 300 MG capsule Take 300-600 mg by mouth See admin instructions. Takes 300 mg in the morning $RemoveBef'300mg'lYfqABTJIu$  in the afternoon and 600 mg at night 03/12/21  Yes [provider]  ipratropium-albuterol (DUONEB) 0.5-2.5 (3) MG/3ML SOLN Take 3 mLs by nebulization every 4 (four) hours as needed. Patient taking differently: Take 3 mLs by nebulization in the morning and at bedtime. 04/10/21  Yes  Lane Hacker L, DO  latanoprost (XALATAN) 0.005 % ophthalmic solution Place 1 drop into both eyes at bedtime.   Yes [provider]  lidocaine (LIDODERM) 5 % Place 1 patch onto the skin daily. Remove & Discard patch within 12 hours or as directed by MD 03/07/21  Yes Regalado, Belkys A, MD  morphine (MSIR) 30 MG tablet Take 1 tablet (30  mg total) by mouth every 4 (four) hours as needed for severe pain. 04/12/21  Yes Acquanetta Chain, DO  polyethylene glycol (MIRALAX / GLYCOLAX) 17 g packet Take 17 g by mouth daily as needed for mild constipation. 03/07/21  Yes Regalado, Belkys A, MD  sertraline (ZOLOFT) 100 MG tablet Take 100 mg by mouth daily. 10/29/20  Yes [provider]  vitamin B-12 (CYANOCOBALAMIN) 1000 MCG tablet Take 1,000 mcg by mouth daily.   Yes [provider]  amphetamine-dextroamphetamine (ADDERALL) 10 MG tablet Take 1 tablet (10 mg total) by mouth 2 (two) times daily. 04/11/21   Acquanetta Chain, DO  blood glucose meter kit and supplies KIT Dispense based on patient and insurance preference. Use up to four times daily as directed. 04/10/21   Donne Hazel, MD  dexamethasone (DECADRON) 1 MG tablet Take 1 tablet (1 mg total) by mouth daily with breakfast. Patient not taking: No sig reported 04/16/21   Lane Hacker L, DO  glucose blood ([employer] GLUCOSE METER TEST STRIPS) test strip Use as instructed 04/12/21   Acquanetta Chain, DO  irbesartan (AVAPRO) 75 MG tablet Take 1 tablet (75 mg total) by mouth daily. Patient not taking: Reported on 04/17/2021 04/11/21 05/11/21  Donne Hazel, MD  pantoprazole (PROTONIX) 40 MG tablet Take 1 tablet (40 mg total) by mouth daily. Patient not taking: Reported on 04/17/2021 04/16/21   Acquanetta Chain, DO  QUEtiapine (SEROQUEL) 50 MG tablet Take 50 mg by mouth at bedtime. Take 50 mg for 3 nights then reduce to 25 mg for three nights, then 12.$RemoveBeforeD'5mg'rFmNQfzTLyKWbU$  every night for sleep Patient not taking: Reported on 04/17/2021 11/06/20    [provider]  furosemide (LASIX) 20 MG tablet Take 1 tablet (20 mg total) by mouth daily. Patient not taking: No sig reported 03/07/21 03/18/21  Elmarie Shiley, MD     Critical care time: N/A  Randal Goens D. Kenton Kingfisher, NP-C [facility] Personal contact information can be found on Amion  04/24/2021, 1:12 PM

## 2021-04-24 NOTE — Progress Notes (Signed)
ANTICOAGULATION CONSULT NOTE - Initial Consult  Pharmacy Consult for Lovenox Indication: presumed PE  Allergies  Allergen Reactions   Oxycodone Hcl Other (See Comments)    Pt reported "seeing things" and " feeling unusual."   Bupropion Nausea And Vomiting    Patient Measurements: Height: 5\' 10"  (177.8 cm) Weight: 80.1 kg (176 lb 9.4 oz) IBW/kg (Calculated) : 73 Heparin Dosing Weight: TBW  Vital Signs: Temp: 98.3 F (36.8 C) (08/16 1200) Temp Source: Axillary (08/16 1200) BP: 112/65 (08/16 1200) Pulse Rate: 96 (08/16 1200)  Labs: Recent Labs    04/24/21 0636  HGB 8.0*  HCT 26.8*  PLT 161  CREATININE 0.44*    Estimated Creatinine Clearance: 103.9 mL/min (A) (by C-G formula based on SCr of 0.44 mg/dL (L)).   Medical History: 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   Anxiety    Depression    Hypertension    Pneumonia    1990's    Medications:  Scheduled:   amoxicillin-clavulanate  1 tablet Oral Q12H   vitamin C  1,000 mg Oral Daily   celecoxib  200 mg Oral BID   Chlorhexidine Gluconate Cloth  6 each Topical Daily   dexamethasone  8 mg Oral Q12H   diazepam  5 mg Oral TID   fentaNYL  1 patch Transdermal Q72H   gabapentin  300 mg Oral BID WC   And   gabapentin  600 mg Oral QHS   ipratropium-albuterol  3 mL Nebulization BID   irbesartan  75 mg Oral Daily   labetalol  100 mg Oral BID   latanoprost  1 drop Both Eyes QHS   magic mouthwash  10 mL Oral TID   magnesium oxide  800 mg Oral Daily   mouth rinse  15 mL Mouth Rinse BID   methadone  20 mg Oral Q12H   pantoprazole  40 mg Oral Daily   polyethylene glycol  17 g Oral BID   senna-docusate  2 tablet Oral BID   sertraline  100 mg Oral Daily   sodium chloride flush  10-40 mL Intracatheter Q12H   Infusions:   sodium chloride Stopped (04/22/21 2150)   fentaNYL infusion INTRAVENOUS 150 mcg/hr (04/24/21 0800)   methocarbamol (ROBAXIN) IV      Assessment: 34 yoM admitted on 8/9  for pain control from metastatic lung cancer.  He was not initially on any pharmaceutical anticoagulation for VTE prophylaxis due to comfort care status.  Today he is having significant desaturations.  Pharmacy is consulted to dose Lovenox for presumed PE.   Baseline coag:  INR 1.1 (8/10) D-dimer elevated at 8.36 SCr low/stable CBC:  Hgb low/decreased to 8, Plt WNL No bleeding or complications reported.  Goal of Therapy:  Anti-Xa level 0.6-1 units/ml 4hrs after LMWH dose given Monitor platelets by anticoagulation protocol: Yes   Plan:  Enoxaparin 1 mg/kg (80mg ) [place] q12h Follow up renal function, CBC q72h Follow up long-term anticoagulation plans.  Gretta Arab PharmD, BCPS Clinical Pharmacist [facility] main pharmacy [phone] 04/24/2021 12:50 PM

## 2021-04-24 NOTE — Progress Notes (Signed)
   I have interacted with the pt's wife Cecille Rubin and discussed hospice services at home. She is in agreement and states this is the pt's wishes. I have ordered a fentanyl PCA pump to be delivered to the Hospice office for nurse to take with her to the home tomorrow after d/c from Hospital. I have also ordered additional oxygen due to the pt's new oxygen demands so that this can be met at home as well. AT first they did not want any additional equipment but  today the wife was in agreement with a BSC, walker, wheelchair and shower bench for delivery at well. They already own a nebulizer machine. She feels he would want to sleep in his own bed which is adjustable.   I have updated the CM Velva Harman   The pt has been approved for hospice services at home by our MD.  Webb Silversmith RN [phone]

## 2021-04-24 NOTE — Progress Notes (Signed)
O2 requirements have increased throughout the night. Pt was on 4L [place], then required 5L and then 6L. Pt de-satted down into the 70's several times. PRN breathing treatment given with no improvement. [facility] notified. Pt on NRB mask at 15 LPM, still de-saturating occasionally.

## 2021-04-25 ENCOUNTER — Encounter: Payer: Self-pay | Admitting: Radiation Oncology

## 2021-04-25 ENCOUNTER — Inpatient Hospital Stay: Payer: Self-pay

## 2021-04-25 ENCOUNTER — Ambulatory Visit
Admit: 2021-04-25 | Discharge: 2021-04-25 | Disposition: A | Discharge status: Home / Self Care | Payer: 59 | Attending: Radiation Oncology | Admitting: Radiation Oncology

## 2021-04-25 DIAGNOSIS — M545 Low back pain, unspecified: Secondary | ICD-10-CM

## 2021-04-25 DIAGNOSIS — R7989 Other specified abnormal findings of blood chemistry: Secondary | ICD-10-CM

## 2021-04-25 DIAGNOSIS — J9601 Acute respiratory failure with hypoxia: Secondary | ICD-10-CM

## 2021-04-25 DIAGNOSIS — R338 Other retention of urine: Secondary | ICD-10-CM

## 2021-04-25 DIAGNOSIS — G9529 Other cord compression: Secondary | ICD-10-CM

## 2021-04-25 DIAGNOSIS — Z515 Encounter for palliative care: Secondary | ICD-10-CM

## 2021-04-25 DIAGNOSIS — C7951 Secondary malignant neoplasm of bone: Secondary | ICD-10-CM

## 2021-04-25 MED ORDER — DIAZEPAM 5 MG PO TABS: 5.0000 mg | ORAL_TABLET | Freq: Three times a day (TID) | ORAL | 0 refills | Status: AC

## 2021-04-25 MED ORDER — MAGNESIUM OXIDE -MG SUPPLEMENT 400 (240 MG) MG PO TABS: 400.0000 mg | ORAL_TABLET | Freq: Every day | ORAL | 0 refills | Status: AC

## 2021-04-25 MED ORDER — LABETALOL HCL 100 MG PO TABS: 100.0000 mg | ORAL_TABLET | Freq: Three times a day (TID) | ORAL | 0 refills | Status: AC

## 2021-04-25 MED ORDER — FENTANYL 50 MCG/ML IV PCA SOLN: INTRAVENOUS | 0 refills | Status: AC

## 2021-04-25 MED ORDER — ENOXAPARIN SODIUM 80 MG/0.8ML IJ SOSY: 80.0000 mg | PREFILLED_SYRINGE | Freq: Two times a day (BID) | INTRAMUSCULAR | 0 refills | Status: AC

## 2021-04-25 MED ORDER — DEXAMETHASONE 4 MG PO TABS: 4.0000 mg | ORAL_TABLET | Freq: Two times a day (BID) | ORAL | 0 refills | Status: AC

## 2021-04-25 MED ORDER — SODIUM CHLORIDE 0.9% FLUSH: 10.0000 mL | INTRAVENOUS | 0 refills | Status: AC | PRN

## 2021-04-25 MED ORDER — METHADONE HCL 10 MG PO TABS: 20.0000 mg | ORAL_TABLET | Freq: Every morning | ORAL | 0 refills | Status: AC

## 2021-04-25 MED ORDER — MAGIC MOUTHWASH: 10.0000 mL | Freq: Three times a day (TID) | ORAL | 0 refills | Status: AC

## 2021-04-25 MED ORDER — SENNOSIDES-DOCUSATE SODIUM 8.6-50 MG PO TABS: 2.0000 | ORAL_TABLET | Freq: Two times a day (BID) | ORAL | 0 refills | Status: AC

## 2021-04-25 MED ORDER — AMOXICILLIN-POT CLAVULANATE 875-125 MG PO TABS: 1.0000 | ORAL_TABLET | Freq: Two times a day (BID) | ORAL | 0 refills | Status: AC

## 2021-04-25 MED ORDER — ONDANSETRON HCL 4 MG PO TABS: 4.0000 mg | ORAL_TABLET | Freq: Four times a day (QID) | ORAL | 0 refills | Status: AC | PRN

## 2021-04-25 NOTE — Discharge Summary (Signed)
Physician Discharge Summary  MERRELL BORSUK ZOX:[id number] DOB: May 28, 1961 DOA: 04/17/2021  PCP: [name], MD  Admit date: 04/17/2021 Discharge date: 04/25/2021  Admitted From: Home Disposition: Home with hospice  Recommendations for Outpatient Follow-up:  Follow-up with hospice provider  Home Health: No Equipment/Devices: Fentanyl pump  Discharge Condition: Grim/poor prognosis CODE STATUS: DNR Diet recommendation: Regular diet as tolerates  History of present illness:  NEKODA CHOCK is a 60 year old male with past medical history significant for essential hypertension, anxiety/depression, 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   Patient with recent hospitalization 7/10 - 8/2 with respiratory failure, recurrent hemoptysis with associated pneumonia right-sided pleural fluid collection s/p drain and extended antibiotic course.  IR drain was removed on 8/1; and patient was discharged on 14-day course of antibiotics.   In the ED, CT angiogram chest with slight decrease of previously seen fluid collection, scattered groundglass opacities, increase in possible left pleural tumor with possible superimposed infection.  Patient was started on broad-spectrum antibiotics.  TRH consulted for further evaluation and management.  Hospital course:  Intractable pain of malignancy Palliative care was consulted and followed during hospital course.  Patient initially was started on a fentanyl drip, ketamine drip and continued on fentanyl patch as well as methadone, gabapentin, Celebrex, Decadron.  Patient received radiation to L-spine for 5 treatments during hospitalization.  Patient will be discharging home on continued fentanyl drip, fentanyl patch, methadone, gabapentin, Celebrex and Decadron  under home hospice services.  Pain now much improved on current regimen.  Acute hypoxic respiratory failure, likely secondary to pulmonary embolism Bilateral lower extremity DVT Overnight, patient's oxygen demand has increased, now requiring 15 L NRB to maintain SPO2.  Patient with also mild tachycardia, likely subdued due to his home labetalol use for hypertension.  D-dimer elevated at 8.36.  Vascular duplex ultrasound positive for DVT.  CT angiogram chest discontinued as unnecessary and patient was started on Lovenox injections in which he will continue on discharge.   Non-small cell lung cancer with metastasis thoracic/lumbar spine with cauda equina syndrome Patient follows with medical oncology outpatient, Dr. Earlie Server.  Patient is status post right upper lobe lobectomy and node dissection by CTS, Dr. Koleen Nimrod on 5/16.  Completed 5 courses of XRT while inpatient.  Discharging home under home hospice services.  Grim/poor prognosis.   Sepsis: Ruled out Initially presenting with altered mental status, fever.  Thought to initially be recurrent pneumonia.  Was started on empiric antibiotics and completed 3-day course now discontinued.  Etiology of his fever likely related to progression of malignancy.  Really continues on Augmentin, likely indefinitely for suspected dental infection.  Culture on admission with no growth x5 days.  Previous sputum culture on 7/25 with normal oral flora.  Previous blood cultures unrevealing and pleural body fluid culture with no organisms seen.   Anemia of chronic disease due to malignancy Hemoglobin 8.0 on 8/16, stable.   Urinary retention Continue Foley catheter on discharge   Dental pain/infection During previous hospitalization, patient was seen by in-house dental surgery who recommended antibiotics and outpatient follow-up with his orthodontist.  Initial plan was for root canal treatment on 03/19/2021 but this was postponed it due to his intractable pain and  events as above.  Continue Augmentin twice daily indefinitely.   Essential hypertension Continue labetalol 100 mg p.o. TID   Type  2 diabetes mellitus Hemoglobin A1c 6.5, well controlled.   Anxiety/depression --Zoloft 100 mg p.o. daily --Valium 5 mg p.o. 3 times daily --Valium 5 mg IV every 4 hours as needed anxiety/muscle spasms   Constipation MiraLAX twice daily, Senokot 2 tabs p.o. 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 [facility])   Acute urinary retention    Discharge Instructions  Discharge Instructions     Diet - low sodium heart healthy   Complete by: As directed    Increase activity slowly   Complete by: As directed    No wound care   Complete by: As directed       Allergies as of 04/25/2021       Reactions   Oxycodone Hcl Other (See Comments)   Pt reported "seeing things" and " feeling unusual."   Bupropion Nausea And Vomiting        Medication List     STOP taking these medications    amphetamine-dextroamphetamine 10 MG tablet Commonly known as: ADDERALL   blood glucose meter kit and supplies Kit   [employer] Glucose Meter Test Strips test strip Generic drug: glucose blood   ferrous sulfate 325 (65 FE) MG tablet   irbesartan 75 MG tablet Commonly known as: AVAPRO   lidocaine 5 % Commonly known as: LIDODERM   morphine 30 MG tablet Commonly known as: MSIR   QUEtiapine 50 MG tablet Commonly known as: SEROQUEL   vitamin B-12 1000 MCG tablet Commonly known as: CYANOCOBALAMIN   vitamin C 1000 MG tablet       TAKE these medications    acetaminophen 500 MG tablet Commonly known as: TYLENOL Take 500 mg by mouth every 8 (eight) hours as needed for moderate pain or headache.   amoxicillin-clavulanate 875-125 MG tablet Commonly known as: AUGMENTIN Take 1 tablet by mouth  every 12 (twelve) hours for 14 days.   benzonatate 200 MG capsule Commonly known as: TESSALON Take 1 capsule (200 mg total) by mouth 2 (two) times daily. What changed:  when to take this reasons to take this   celecoxib 200 MG capsule Commonly known as: CELEBREX Take 1 capsule (200 mg total) by mouth 2 (two) times daily.   dexamethasone 4 MG tablet Commonly known as: DECADRON Take 1 tablet (4 mg total) by mouth every 12 (twelve) hours. What changed:  medication strength how much to take when to take this   diazepam 10 MG tablet Commonly known as: VALIUM Take 0.5-1 tablets (5-10 mg total) by mouth daily as needed for anxiety (30 minutes PRIOR to Radiation). What changed: Another medication with the same name was changed. Make sure you understand how and when to take each.   diazepam 5 MG tablet Commonly known as: VALIUM Take 1 tablet (5 mg total) by mouth 3 (three) times daily. What changed:  medication strength how much to take when to take this   enoxaparin 80 MG/0.8ML injection Commonly known as: LOVENOX Inject 0.8 mLs (80 mg total) into the skin every 12 (twelve) hours.   fentaNYL 100 MCG/HR Commonly known as: [place] 1 patch onto the skin every 3 (three) days. What changed: Another medication with the same name was removed. Continue taking this medication, and follow the directions you see here.   fentaNYL 50 mcg/mL injection Administer 50 mcg  every 15 min via CADD pump PCA through Right PICC Line. No basal required -other changes per Hospice Provider.   gabapentin 300 MG capsule Commonly known as: NEURONTIN Take 300-600 mg by mouth See admin instructions. Takes 300 mg in the morning 362m in the afternoon and 600 mg at night   ipratropium-albuterol 0.5-2.5 (3) MG/3ML Soln Commonly known as: DUONEB Take 3 mLs by nebulization every 4 (four) hours as needed. What changed: when to take this   labetalol 100 MG tablet Commonly known as: NORMODYNE Take 1  tablet (100 mg total) by mouth 3 (three) times daily.   latanoprost 0.005 % ophthalmic solution Commonly known as: XALATAN Place 1 drop into both eyes at bedtime.   magic mouthwash Soln Take 10 mLs by mouth 3 (three) times daily.   magnesium oxide 400 (240 Mg) MG tablet Commonly known as: MAG-OX Take 1 tablet (400 mg total) by mouth daily. Start taking on: ASep 17, 2022  methadone 10 MG tablet Commonly known as: DOLOPHINE Take 2 tablets (20 mg total) by mouth every morning. Take 2 tablets every morning and 1 tablet every evening   ondansetron 4 MG tablet Commonly known as: ZOFRAN Take 1 tablet (4 mg total) by mouth every 6 (six) hours as needed for nausea.   pantoprazole 40 MG tablet Commonly known as: PROTONIX Take 1 tablet (40 mg total) by mouth daily.   polyethylene glycol 17 g packet Commonly known as: MIRALAX / GLYCOLAX Take 17 g by mouth daily as needed for mild constipation.   senna-docusate 8.6-50 MG tablet Commonly known as: Senokot-S Take 2 tablets by mouth 2 (two) times daily.   sertraline 100 MG tablet Commonly known as: ZOLOFT Take 100 mg by mouth daily.   sodium chloride flush 0.9 % Soln Commonly known as: NS 10-40 mLs by Intracatheter route as needed (flush).        Allergies  Allergen Reactions   Oxycodone Hcl Other (See Comments)    Pt reported "seeing things" and " feeling unusual."   Bupropion Nausea And Vomiting    Consultations: Palliative care PCCM   Procedures/Studies: DG Chest 2 View  Result Date: 04/10/2021 CLINICAL DATA:  Status post right chest tube removal. EXAM: CHEST - 2 VIEW COMPARISON:  04/09/2021.  CT 04/09/2021. FINDINGS: Surgical clips right upper chest. Stable density noted in the right apex consistent previously identified complex fluid collection. Stable adjacent atelectasis. Tiny right pleural effusion. No pneumothorax. IMPRESSION: Stable prominent density in the right apex consistent previously identified complex  fluid collection. Stable adjacent atelectasis. Tiny right pleural effusion. No pneumothorax. Chest is unchanged from prior exam. Electronically Signed   By: TMarcello Moores Register   On: 04/10/2021 11:26   CT CHEST WO CONTRAST  Result Date: 04/09/2021 CLINICAL DATA:  Evaluate right apical fluid collection following drain placement. Back pain. History of lung cancer with right upper lobectomy. EXAM: CT CHEST WITHOUT CONTRAST TECHNIQUE: Multidetector CT imaging of the chest was performed following the standard protocol without IV contrast. COMPARISON:  Chest CT 04/02/2021, 03/27/2021 and 03/18/2021 FINDINGS: Cardiovascular: Coronary artery atherosclerosis and aortic valvular calcifications are noted. No acute vascular findings are seen on noncontrast imaging. The heart size is normal. There is no pericardial effusion. Mediastinum/Nodes: There are no enlarged mediastinal, hilar or axillary lymph nodes. The thyroid gland, trachea and esophagus demonstrate no significant findings. Lungs/Pleura: Stable small to moderate dependent right pleural effusion status post right upper lobectomy. Interval placement of a percutaneous pigtail catheter into the complex right apical collection.  The superior component of this collection has mildly decreased in size, now measuring approximately 9.7 x 7.2 x 7.4 cm (previously 10.6 x 9.0 x 8.0 cm). This collection remains complex with high-density components and internal air. Complex inferolateral component containing air has not significantly changed, measuring up to 7.5 cm on image 65/2. There is no pneumothorax. The previously demonstrated patchy airspace opacities throughout lungs have partially cleared. Upper abdomen: The visualized upper abdomen appears stable without significant findings. Probable small sebaceous cyst in the upper back. Musculoskeletal/Chest wall: There is no chest wall mass or suspicious osseous finding. Mild bilateral gynecomastia. IMPRESSION: 1. Interval mild  decrease in size of complex fluid collection superiorly in the right hemithorax following percutaneous drainage catheter placement. There is persistent gas within this collection. 2. Interval improvement in previously demonstrated patchy bilateral airspace opacities consistent with resolving hemorrhage or infection. 3. Stable small right pleural effusion.  No pneumothorax. 4. Coronary artery atherosclerosis. Electronically Signed   By: Richardean Sale M.D.   On: 04/09/2021 14:00   CT Chest W Contrast  Result Date: 04/17/2021 CLINICAL DATA:  Fever of unknown origin. History of lung cancer with ongoing radiation therapy. EXAM: CT CHEST WITH CONTRAST TECHNIQUE: Multidetector CT imaging of the chest was performed during intravenous contrast administration. CONTRAST:  31m OMNIPAQUE IOHEXOL 350 MG/ML SOLN COMPARISON:  04/09/2021 chest CT FINDINGS: Cardiovascular: Left anterior descending coronary artery atherosclerosis along with mild thoracic aortic atherosclerotic calcification. Mediastinum/Nodes: Stable small scattered mediastinal lymph nodes. Lungs/Pleura: There has been removal of the pigtail catheter from the large complex process involving the right lung apex with extension into the right middle lobe and possibly the right lower lobe. Prior right upper lobectomy. The complex right apical process measures about 11.7 by 8.3 by 11.1 cm (volume = 560 cm^3) and contains mostly complex material with some scattered mixed and gas, and with a relatively stable appearing cavitary component in the upper portion of the left lower lobe. When I measure this process in the same fashion on the 04/09/2021 exam I arrive at a size of 11.8 by 8.3 by 12.1 cm (volume = 620 cm^3), and is this process is minimally reduced in volume compared to 04/09/2021. Small amount of airspace opacity anteriorly in the right middle lobe on image 55 of series 4 previously had ground-glass opacity and has currently mildly increased in density.  Secondary pulmonary lobular interstitial accentuation at the right lung base with scattered ground-glass opacities. A small right pleural effusion is observed, and medially along this pleural effusion on image 87 series 2 there is a 2.5 by 2.5 cm nodule or complex lesion which previously measured about 2.0 by 1.6 cm. I cannot exclude a tumor deposit in this vicinity. Volume loss and ground-glass opacity along the margins of the extension of the complex right apical fluid collection into the right lower lobe noted. This is similar to prior. Slight improvement in the scattered ground-glass opacities in the left lung compared to previous. Small clustered nodules in the left upper lobe on images 71-78 of series 4 measuring up to about 0.3 cm in diameter, similar to prior. Right subpleural nodule 0.7 cm in diameter on image 108 series 4, previously 0.5 cm. Right subpleural nodule 0.9 by 0.8 cm on image 110 series 4, previously 0.7 by 0.5 cm. A peripheral right lower lobe nodule measures up to 0.6 cm in diameter on image 114 series 4, previously 0.4 cm. Accordingly some of the left basilar nodules are enlarging. There is a new 4 mm  left lower lobe subpleural nodule on image 98 series 4. Upper Abdomen: Fullness of the right adrenal gland is incompletely characterized. Musculoskeletal: Stable right fifth rib fracture. Screw tracks in the right fifth and sixth ribs. IMPRESSION: 1. Right upper lobectomy with complex right apical collection primarily with mixed density measuring 560 cubic cm, slightly reduced from prior measurement of 620 cubic cm on 04/09/2021. This continues to bulge down into the right middle lobe and right lower lobe with a right lower lobe superior segmental cavitary component as on previous exams. The pigtail catheter has been removed from this collection. 2. Waxing and waning appearance of other findings, including mostly improved scattered ground-glass densities in the left lung although with some  increase in nodularity in the left lower lobe which is nonspecific and could be from atypical infection (less likely malignant spread). Similar degree of secondary pulmonary lobular interstitial accentuation at the right lung base with scattered ground-glass opacities. 3. Increase in size of a 2.5 cm in diameter complex region medially along the left pleural effusion. I cannot exclude pleural tumor. This previously measured 2.0 by 1.6 cm. Surveillance will likely be warranted. 4. Given the widespread scattered complex pulmonary opacities and especially the findings in the right lung, superimposed infection is not excluded as a potential cause for fever. 5. Stable subacute right fifth rib fracture with screw tracks in the right fifth and sixth ribs. Electronically Signed   By: Van Clines M.D.   On: 04/17/2021 07:38   CT CHEST W CONTRAST  Result Date: 03/27/2021 CLINICAL DATA:  Persistent chest pain. History of right upper lobectomy and recent CT suspicious for recurrence. EXAM: CT CHEST WITH CONTRAST TECHNIQUE: Multidetector CT imaging of the chest was performed during intravenous contrast administration. CONTRAST:  33m OMNIPAQUE IOHEXOL 350 MG/ML SOLN COMPARISON:  Multiple recent radiographs.  Chest CT 9 days ago. FINDINGS: Cardiovascular: Normal caliber thoracic aorta. Mild atherosclerosis. Coronary artery calcifications. Normal heart size. No pericardial effusion. No obvious pulmonary embolus on this non angiographic exam. Mediastinum/Nodes: There are enlarged right lower paratracheal nodes measuring 16 and 17 mm, unchanged allowing for differences in caliper placement. Additional smaller mediastinal lymph nodes are similar. No thyroid nodule. No esophageal wall thickening. Lungs/Pleura: History of right upper lobectomy. Loculated collection at the right lung apex lined by thick masslike nodularity. This is not significantly changed in the interim. This again measures at least 10 cm ostial dimension.  There scattered calcifications. There is surrounding ground-glass opacity extending into the adjacent right lung with improvement from prior exam. Small right pleural effusion is similar there is a posterior pleural based nodule in the right hemithorax measuring 17 mm, series 2, image 46. Upper Abdomen: No acute findings. Musculoskeletal: Postsurgical change of right posterior upper ribs. There is no frank bony destruction related to right apical masslike opacity. No discrete osseous lesions. No definite chest wall extension of apical lesion. IMPRESSION: 1. No significant change in loculated collection at the right lung apex lined by thick masslike nodularity, suspicious for malignancy recurrence. 2. Mild improvement in surrounding ground-glass opacity in the adjacent right lung, likely representing improving hemorrhage. 3. Unchanged small right pleural effusion. Unchanged pleural based nodule in the posterior right hemithorax. 4. Enlarged right lower paratracheal lymph nodes are similar to recent prior. 5. Coronary artery calcifications. Aortic Atherosclerosis (ICD10-I70.0). Electronically Signed   By: MKeith RakeM.D.   On: 03/27/2021 19:58   CT Angio Chest Pulmonary Embolism (PE) W or WO Contrast  Result Date: 04/02/2021 CLINICAL DATA:  Shortness  of breath and cough. History of pulmonary adenocarcinoma with right upper lobectomy. Undergoing chemotherapy EXAM: CT ANGIOGRAPHY CHEST WITH CONTRAST TECHNIQUE: Multidetector CT imaging of the chest was performed using the standard protocol during bolus administration of intravenous contrast. Multiplanar CT image reconstructions and MIPs were obtained to evaluate the vascular anatomy. CONTRAST:  164m OMNIPAQUE IOHEXOL 350 MG/ML SOLN COMPARISON:  Chest CT 03/27/2021 FINDINGS: Cardiovascular: Suboptimal bolus density/timing exacerbated by streak artifact and motion. No evidence of central, lobar, or (when occasionally visible) segmental branch PE. No cardiomegaly  or pericardial effusion. No acute aortic finding. Diffuse atheromatous calcification of the coronaries. Mediastinum/Nodes: No noted adenopathy or inflammation. Lungs/Pleura: Right apical collection with peripheral nodularity and bulging of adjacent mediastinal structures, 10.6 Cm in maximal transverse span. This collection contains new gas and there is progressive adjacent pulmonary opacification. There is also new infiltrate in the left lower lung with the same airspace pattern. The major central airways are clear. The dependent right pleural effusion is unchanged in size, small to moderate. Upper Abdomen: Negative Musculoskeletal: No acute finding no evidence of direct erosion or hematogenous bony metastasis. Review of the MIP images confirms the above findings. IMPRESSION: 1. New gas in the complex right apical collection, now likely communicating with the airways. Attendant increase in bilateral airspace disease which could be alveolar hemorrhage, endobronchial debris spread, or pneumonia. 2. Suboptimal pulmonary artery angiogram with no evidence of main or lobar pulmonary embolism. 3. Notable nodularity at the periphery of the right apical collection concerning for tumor recurrence. Electronically Signed   By: JMonte FantasiaM.D.   On: 04/02/2021 11:09   CT LUMBAR SPINE WO CONTRAST  Result Date: 04/09/2021 CLINICAL DATA:  Initial evaluation for low back pain. History of lung cancer. EXAM: CT LUMBAR SPINE WITHOUT CONTRAST TECHNIQUE: Multidetector CT imaging of the lumbar spine was performed without intravenous contrast administration. Multiplanar CT image reconstructions were also generated. COMPARISON:  None available. FINDINGS: Segmentation: Standard. Lowest well-formed disc space labeled the L5-S1 level. Alignment: Trace retrolisthesis of T12 on L1 and L2 on L3. Underlying minimal sigmoid scoliotic curvature. Vertebrae: Vertebral body height maintained without acute or chronic fracture. Visualized sacrum  and pelvis intact. SI joints symmetric and normal. No discrete lytic or blastic osseous lesions to suggest osseous metastatic disease evident by CT. Subcentimeter sclerotic lesion at the posterior right iliac wing noted, most characteristic of a small benign bone island (series 2, image 134). Paraspinal and other soft tissues: Paraspinous soft tissues demonstrate no acute finding. 1.8 cm well-circumscribed hypodense lesion within the subcutaneous fat along the midline of the mid back at the level of T12-L1 most characteristic of a small sebaceous cyst/epidermal inclusion cyst (series 3, image 65). No other discrete soft tissue lesions. Small to moderate layering right pleural effusion partially visualized. Postoperative changes noted about the partially visualized colon. Mild aorto bi-iliac atherosclerotic disease. Visualized visceral structures otherwise unremarkable. Disc levels: T12-L1: Trace retrolisthesis. Degenerative intervertebral disc space narrowing with disc desiccation and mild disc bulge. Superimposed right extraforaminal disc protrusion (series 3, image 60). No spinal stenosis. Mild right foraminal narrowing. Left neural foramina remains patent. L1-2: Degenerative intervertebral disc space narrowing with diffuse disc bulge, eccentric to the right. Mild facet hypertrophy. No significant spinal stenosis. Moderate right worse than left L1 foraminal narrowing. L2-3: Degenerative intervertebral disc space narrowing with disc desiccation and diffuse disc bulge. Mild reactive endplate spurring. Mild facet hypertrophy. No spinal stenosis. Mild left L2 foraminal narrowing. Right neural foramen remains patent. L3-4: Degenerative intervertebral disc space narrowing with  diffuse disc bulge and mild disc desiccation. Minimal reactive endplate spurring. Mild facet hypertrophy. Resultant mild spinal stenosis. Mild bilateral L3 foraminal narrowing. L4-5: Degenerative intervertebral disc space narrowing with diffuse  disc bulge and disc desiccation. Associated reactive endplate change with marginal endplate osteophytic spurring. Suspected superimposed small right foraminal disc protrusion (series 3, image 120). Mild to moderate facet hypertrophy. Mild canal with bilateral lateral recess stenosis. Moderate right worse than left L4 foraminal narrowing. L5-S1: Degenerative intervertebral disc space narrowing with diffuse disc bulge and disc desiccation. Associated reactive endplate spurring. Mild to moderate right worse than left facet hypertrophy. No significant spinal stenosis. Moderate bilateral L5 foraminal narrowing, right worse than left. IMPRESSION: 1. No acute abnormality within the lumbar spine. No CT evidence for osseous metastatic disease identified. If there is persistent clinical concern for possible occult metastatic disease, further assessment with dedicated MRI or bone scan would be recommended for further evaluation. 2. Moderate multilevel degenerative spondylolysis. Resultant mild spinal stenosis at L3-4 and L4-5, with mild-to-moderate multilevel foraminal narrowing as above, most pronounced at L4 and L5 bilaterally. 3. Small to moderate layering right pleural effusion, partially visualized. Electronically Signed   By: Jeannine Boga M.D.   On: 04/09/2021 02:42   CT PELVIS WO CONTRAST  Result Date: 04/09/2021 CLINICAL DATA:  Bilateral hip pain. Back pain. New onset pain limiting ability to walk. History of lung cancer. EXAM: CT PELVIS WITHOUT CONTRAST TECHNIQUE: Multidetector CT imaging of the pelvis was performed following the standard protocol without intravenous contrast. COMPARISON:  PET CT 12/13/2020 FINDINGS: Urinary Tract: Distal ureters are decompressed. Unremarkable urinary bladder. Bowel: Moderate stool in the included colon. No bowel inflammation. Vascular/Lymphatic: Iliac atherosclerosis.  No pelvic adenopathy. Reproductive:  Unremarkable prostate gland. Other:  No pelvic free fluid.  Musculoskeletal: No blastic or destructive lytic lesions. No evidence of fracture. Both femoral heads are seated in the acetabulum with preservation of joint spaces. No visualized avascular necrosis. No periosteal reaction or bony destruction. No hip joint effusions. Minimal degenerative change of both sacroiliac joints. Lumbar spine assessed on concurrent lumbar spine CT. There are no intramuscular findings to suggest metastatic implants are disease. IMPRESSION: No findings of osseous metastatic disease on CT. No acute explanation for hip pain. If there is persistent clinical concern for bony metastasis, recommend further assessment with MRI or bone scan. Electronically Signed   By: Keith Rake M.D.   On: 04/09/2021 01:34   MR Lumbar Spine W Wo Contrast  Result Date: 04/18/2021 CLINICAL DATA:  Initial evaluation for cord compression, severe lower back pain, history of lung cancer. EXAM: MRI LUMBAR SPINE WITHOUT AND WITH CONTRAST TECHNIQUE: Multiplanar and multiecho pulse sequences of the lumbar spine were obtained without and with intravenous contrast. CONTRAST:  22m GADAVIST GADOBUTROL 1 MMOL/ML IV SOLN COMPARISON:  CT from 04/09/2021. FINDINGS: Segmentation: Standard. Lowest well-formed disc space labeled the L5-S1 level. Alignment: Trace retrolisthesis of L1 on L2 and L2 on L3. Alignment otherwise normal with preservation of the normal lumbar lordosis. Vertebrae: Abnormal STIR signal intensity with enhancement seen largely replacing the L1 vertebral body, consistent with osseous metastatic disease. Extension into the right pedicle posteriorly noted. There is associated epidural tumor within the adjacent spinal canal, extending from approximately T12 through L2 (series 17, image 9). Tumor is most pronounced within the ventral and right aspect of the epidural space, with greatest involvement at the level of L1 with there is secondary compression of the thecal sac and severe spinal stenosis (series 8,  image 9). Thecal sac  narrowed to approximately 5 mm in AP diameter at its most narrow point (series 8, image 11). Suspected subdural and/or intradural involvement with probable tumor seen intimately associated with the distal conus and proximal nerve roots of the cauda equina (series 8, image 14). Lateral extension to involve the neural foramina on the right at T12-L1, bilaterally, but greater on the right at L1-2, and on the right at L2-3. Elsewhere, heterogeneous signal intensity seen throughout the visualized osseous structures. Possible additional metastatic involvement at the posterior/superior aspect of the L2 vertebral body (series 6, image 7). No other definite discrete osseous metastases are seen. Vertebral body height maintained without pathologic fracture. Conus medullaris and cauda equina: Conus extends to the approximately the T12-L1 level. Metastatic and epidural involvement as above. Paraspinal and other soft tissues: Mild diffuse edema and enhancement seen within the posterior paraspinous musculature, nonspecific, but could reflect muscular strain or possibly myositis. No collections. 1.7 cm epidermal inclusion cyst/sebaceous cyst noted at the left upper back. Prominent distension of the partially visualized urinary bladder. Disc levels: T11-12: Seen only on sagittal projection. Disc bulge with disc desiccation. No significant spinal stenosis. Foramina appear patent. T12-L1: Disc bulge with disc desiccation. Circumferential epidural tumor, greatest on the right. Associated flattening and compression of the thecal sac with resultant moderate spinal stenosis. Tumor extension into the right neural foramen. Left neural foramina remains patent. L1-2: Disc bulge with disc desiccation. Possible superimposed right paracentral disc protrusion. Circumferential epidural tumor compresses the thecal sac with resultant severe spinal stenosis. Thecal sac measures 5 mm in AP diameter at its most narrow point. Tumor  extension into the right greater than left neural foramina noted. L2-3: Degenerative intervertebral disc space narrowing with diffuse disc bulge, eccentric to the left. Bilateral facet hypertrophy. No significant spinal stenosis. Mild left foraminal narrowing. Right neural foramina remains patent. L3-4: Disc bulge with disc desiccation. Reactive endplate spurring. Mild bilateral facet hypertrophy. Resultant mild narrowing of the left lateral recess. Central canal remains patent. Mild right L3 foraminal stenosis. Left neural foramina remains patent. L4-5: Advanced degenerative intervertebral disc space narrowing with diffuse disc bulge and reactive endplate spurring. Mild to moderate bilateral facet hypertrophy. No more than mild spinal stenosis. Mild bilateral L4 foraminal narrowing. No frank impingement. L5-S1: Degenerative intervertebral disc space narrowing with disc desiccation and reactive endplate change. Superimposed central disc protrusion indents the ventral thecal sac. Mild facet hypertrophy. No significant spinal stenosis. Mild bilateral L5 foraminal narrowing. IMPRESSION: 1. 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 Resultant severe spinal stenosis, most pronounced at the level of L1-2. Probable subdural and/or intradural involvement with tumor seen intimately associated with the distal conus and proximal nerve roots of the cauda equina. Extension into the right greater than left neural foramina at T12-L1 through L2-3 as above. 2. Possible additional metastatic involvement of the posterior/superior aspect of the L2 vertebral body. No other discrete osseous metastatic disease. No associated pathologic fracture. 3. Prominent distension of the partially visualized urinary bladder. Clinical correlation for possible urinary retention recommended. 4. Diffuse edema and enhancement within the posterior paraspinous musculature,  nonspecific, but could reflect muscular strain or possibly myositis. No discrete collections. 5. Underlying multilevel degenerative spondylosis as detailed above. Current attempt is being made to contact the covering provider at 10:39 p.m. on 04/18/2021, currently awaiting a call back. Results will be conveyed as soon as possible. Electronically Signed   By: Jeannine Boga M.D.   On: 04/18/2021 23:07  MR SACRUM SI JOINTS W WO CONTRAST  Result Date: 04/19/2021 CLINICAL DATA:  Low back pain and sacral pain EXAM: MRI SACRUM WITHOUT CONTRAST TECHNIQUE: Multiplanar multi-sequence MR imaging of the sacrum was performed. No intravenous contrast was administered. COMPARISON:  04/09/2021 CT scan FINDINGS: Osseous structures: The sacroiliac joints appear unremarkable. No findings of metastatic disease to the sacrum. Red marrow redistribution is present. This is nonspecific can be associated with a variety of conditions including chronic anemia, chronic heart failure, myelofibrosis, infiltrative marrow disease, response to infection, and other conditions. There is evidence of lower lumbar spondylosis and degenerative disc disease. Musculotendinous: Unremarkable where included. Sacral plexus and sciatic nerves: No impinging lesion, abnormal edema, or abnormal enhancement along the sacral plexus or visualized sciatic nerves. Other: Urinary bladder mildly distended, significance uncertain. And visualized bowel appear unremarkable. IMPRESSION: 1. Lower lumbar spondylosis and degenerative disc disease, please see dedicated lumbar spine MRI report. 2. Mild distention of the urinary bladder. Electronically Signed   By: Van Clines M.D.   On: 04/19/2021 05:31   DG CHEST PORT 1 VIEW  Result Date: 04/24/2021 CLINICAL DATA:  Shortness of breath. EXAM: PORTABLE CHEST 1 VIEW COMPARISON:  April 23, 2021. FINDINGS: Stable cardiomediastinal silhouette. Left-sided PICC line is unchanged in position. No pneumothorax is  noted. Left lung is clear. Stable postsurgical changes and opacity are noted in the right upper lobe. Stable right basilar subsegmental atelectasis is noted. Bony thorax is unremarkable. IMPRESSION: Stable right lung opacities compared to prior exam. No significant change compared to prior exam. Electronically Signed   By: Marijo Conception M.D.   On: 04/24/2021 08:44   DG CHEST PORT 1 VIEW  Result Date: 04/23/2021 CLINICAL DATA:  Fever, shortness of breath. EXAM: PORTABLE CHEST 1 VIEW COMPARISON:  April 17, 2021. FINDINGS: Stable cardiomediastinal silhouette. No pneumothorax is noted. Left lung is clear. Stable stable postsurgical changes and opacity are noted in the right upper lobe. Mild right basilar subsegmental atelectasis or inflammation is noted. Bony thorax is unremarkable. IMPRESSION: Stable postsurgical changes and abnormal opacity are noted in right upper lobe. Mild right basilar subsegmental atelectasis or inflammation is noted. Electronically Signed   By: Marijo Conception M.D.   On: 04/23/2021 09:36   DG Chest Port 1 View  Result Date: 04/17/2021 CLINICAL DATA:  Shortness of breath with fevers EXAM: PORTABLE CHEST 1 VIEW COMPARISON:  04/10/2021 FINDINGS: Cardiac shadow is stable. Left lung is clear. Right lung again demonstrates soft tissue density in the apex stable in appearance from the prior exam. Postsurgical changes are noted in the right apex. No new focal abnormality is noted. IMPRESSION: Stable appearance of the chest when compare with the prior exam. Electronically Signed   By: Inez Catalina M.D.   On: 04/17/2021 03:09   DG CHEST PORT 1 VIEW  Result Date: 04/09/2021 CLINICAL DATA:  Chest tube removal EXAM: PORTABLE CHEST 1 VIEW COMPARISON:  04/09/2021 at 0507 hours FINDINGS: Interval removal of right-sided pleural drainage catheter. Large rounded opacity persists at the right lung apex compatible with known complex fluid and air containing collection. Multiple surgical clips at the  right lung apex. No pneumothorax is seen. Left lung is clear. Heart size is stable. IMPRESSION: Interval removal of right-sided pleural drainage catheter. No pneumothorax. Electronically Signed   By: Davina Poke D.O.   On: 04/09/2021 19:06   DG Chest Port 1 View  Result Date: 04/09/2021 CLINICAL DATA:  Chest tube, possible pleural effusion EXAM: PORTABLE CHEST 1 VIEW COMPARISON:  Portable exam 0507 hours compared to 04/07/2021 FINDINGS: Pigtail RIGHT thoracostomy tube at upper RIGHT hemithorax unchanged. Persistent pleuroparenchymal opacity at RIGHT apex as well as numerous surgical clips, unchanged. Subsegmental atelectasis mid RIGHT lung. No pneumothorax or basilar pleural effusion. LEFT lung clear. Heart size stable. IMPRESSION: No interval change in RIGHT apical opacity and pigtail thoracostomy tube. Electronically Signed   By: Lavonia Dana M.D.   On: 04/09/2021 08:15   DG Chest Port 1 View  Result Date: 04/07/2021 CLINICAL DATA:  Right-sided pleural effusion EXAM: PORTABLE CHEST 1 VIEW COMPARISON:  Yesterday FINDINGS: Midline trachea. Right-sided pigtail catheter again projects over the right lung apex with similar opacification. Mild right hemidiaphragm elevation. No well-defined pneumothorax. Clear left lung. Normal heart size. IMPRESSION: Similar appearance of the chest with pigtail catheter projecting over an area of opacified right lung apex. No new findings. Electronically Signed   By: Abigail Miyamoto M.D.   On: 04/07/2021 11:46   DG CHEST PORT 1 VIEW  Result Date: 04/06/2021 CLINICAL DATA:  Cough EXAM: PORTABLE CHEST 1 VIEW COMPARISON:  04/05/2021 FINDINGS: Slight interval increase in fluid opacification of the right pulmonary apex, with a pigtail drainage catheter remaining in position. Left lung is normally aerated. Heart and mediastinum are unremarkable. IMPRESSION: Slight interval increase in fluid opacification of the right pulmonary apex, with a pigtail drainage catheter remaining in  position. Left lung is normally aerated. Electronically Signed   By: Eddie Candle M.D.   On: 04/06/2021 12:54   DG Chest Port 1 View  Result Date: 04/05/2021 CLINICAL DATA:  Right chest tube. EXAM: PORTABLE CHEST 1 VIEW COMPARISON:  Chest x-ray 04/04/2021.  CT 04/02/2021. FINDINGS: Surgical clips right upper chest. Right chest tube in stable position. No pneumothorax. Right apical cavitary fluid collection/mass again noted without interim change. No pleural effusion. Stable cardiomegaly. Mild thoracic spine scoliosis. IMPRESSION: 1.  Right chest tube in stable position.  No pneumothorax. 2. Large right apical cavitary fluid collection/mass again noted without interim change. Electronically Signed   By: Marcello Moores  Register   On: 04/05/2021 06:14   DG Chest Port 1 View  Result Date: 04/04/2021 CLINICAL DATA:  Chest tube.  Pleural effusion. EXAM: PORTABLE CHEST 1 VIEW COMPARISON:  04/03/2021. FINDINGS: Right chest tube noted over the right upper chest. No pneumothorax. Large right apical fluid collection/mass again noted without interim change. Low lung volumes with mild bibasilar atelectasis. Stable right-sided pleural thickening. No pleural effusion. Heart size stable. IMPRESSION: 1. Right chest tube noted over the right upper chest. No pneumothorax. 2. Large right apical fluid collection/mass again noted without interim change. Low lung volumes with mild bibasilar atelectasis. Electronically Signed   By: Marcello Moores  Register   On: 04/04/2021 08:26   DG Chest Port 1 View  Result Date: 04/03/2021 CLINICAL DATA:  Hemoptysis. EXAM: PORTABLE CHEST 1 VIEW COMPARISON:  CT 04/02/2021.  Chest x-ray 04/02/2021. FINDINGS: Heart size stable. Large right apical fluid collection/mass again noted without interim change. Low lung volumes with persistent bibasilar atelectasis. Mild bibasilar infiltrates again noted. Stable right-sided pleural thickening. No pneumothorax. Surgical clips right chest. Mild thoracic spine  scoliosis. IMPRESSION: 1. Large right apical fluid collection/mass again noted without interim change. 2. Low lung volumes with persistent bibasilar atelectasis. Mild bibasilar infiltrates again noted. Electronically Signed   By: Marcello Moores  Register   On: 04/03/2021 07:26   DG Chest Port 1 View  Result Date: 04/02/2021 CLINICAL DATA:  Shortness of breath and tachycardia. EXAM: PORTABLE CHEST 1 VIEW COMPARISON:  03/27/2021 FINDINGS:  Stable cardiomediastinal silhouette. Right upper lobe opacity is again noted corresponding to loculated pleural fluid and recurrent tumor as noted on recent chest CT from 03/27/2021. No pleural effusion or edema. New airspace opacity identified within the left lower lobe. The visualized osseous structures are unremarkable. IMPRESSION: 1. New left lower lobe airspace opacity concerning for pneumonia. 2. Stable right upper lobe opacity corresponding to loculated pleural fluid and recurrent tumor. Electronically Signed   By: Kerby Moors M.D.   On: 04/02/2021 06:44   DG Chest Port 1 View  Result Date: 03/27/2021 CLINICAL DATA:  Right upper lobectomy EXAM: PORTABLE CHEST 1 VIEW COMPARISON:  03/24/2021 FINDINGS: Postsurgical changes in the right upper lobe. Persistent right upper lobe pulmonary mass unchanged from 03/24/2021. No pleural effusion or pneumothorax. Stable cardiomediastinal silhouette. No aggressive osseous lesion. IMPRESSION: Postsurgical changes in the right upper lobe. Persistent right upper lobe pulmonary mass unchanged from 03/24/2021. Electronically Signed   By: Kathreen Devoid   On: 03/27/2021 11:50   CT IMAGE GUIDED DRAINAGE BY PERCUTANEOUS CATHETER  Result Date: 04/03/2021 CLINICAL DATA:  Loculated effusion post partial pneumonectomy EXAM: CT GUIDED CHEST DRAIN PLACEMENT ANESTHESIA/SEDATION: Intravenous Fentanyl 52mg and Versed 0.513mwere administered as conscious sedation during continuous monitoring of the patient's level of consciousness and physiological /  cardiorespiratory status by the radiology RN, with a total moderate sedation time of 22 minutes. PROCEDURE: The procedure, risks, benefits, and alternatives were explained to the patient. Questions regarding the procedure were encouraged and answered. The patient understands and consents to the procedure. Select axial scans through the thorax were obtained. The loculated right apical collection was localized and an appropriate skin entry site was determined and marked. The operative field was prepped with chlorhexidinein a sterile fashion, and a sterile drape was applied covering the operative field. A sterile gown and sterile gloves were used for the procedure. Local anesthesia was provided with 1% Lidocaine. Under CT fluoroscopic guidance, 18 gauge percutaneous entry needle advanced to the collection. Amplatz wire advanced within the collection easily, position confirmed on CT. Tract dilated to facilitate placement of a 14 French pigtail drain catheter, position within the central dependent aspect of the collection. Bloody material returned. Catheter was secured externally 0 Prolene suture and StatLock and placed to Pleur-evac -20 cm H2O drainage. Site covered with sterile gauze and Vaseline gauze dressing. The patient tolerated the procedure well. COMPLICATIONS: None immediate FINDINGS: Loculated right apical collection was localized. 14 French pigtail drain catheter placed as chest drain. IMPRESSION: 1. Technically successful CT-guided right chest drain placement Electronically Signed   By: D Lucrezia Europe.D.   On: 04/03/2021 16:00   VAS [place] EXTREMITY VENOUS (DVT)  Result Date: 04/24/2021  Lower Venous DVT Study Patient Name:  TIYOSHIHARU BRASSELLDate of Exam:   04/24/2021 Medical Rec #: [id number]          Accession #:    [id number] of Birth: 1207/14/1962         Patient Gender: M Patient Age:   [age]ears [facility]:      VAS [place] EXTREMITY VENOUS (DVT)  Referring Phys: Raima Geathers [place]-------------------------------------------------------------------------------  Indications: Acute hypoxic respiratory failure.  Risk Factors: Cancer -Lung w/ mets. Comparison Study: No previous ecams Performing Technologist: Jody Hill RVT, RDMS  Examination Guidelines: A complete evaluation includes B-mode imaging, spectral Doppler, color Doppler, and power Doppler as needed of all accessible portions of each vessel. Bilateral testing is considered  an integral part of a complete examination. Limited examinations for reoccurring indications may be performed as noted. The reflux portion of the exam is performed with the patient in reverse Trendelenburg.  +---------+---------------+---------+-----------+----------+-----------------+ RIGHT    CompressibilityPhasicitySpontaneityPropertiesThrombus Aging    +---------+---------------+---------+-----------+----------+-----------------+ CFV      Full           Yes      Yes                                    +---------+---------------+---------+-----------+----------+-----------------+ SFJ      Full                                                           +---------+---------------+---------+-----------+----------+-----------------+ FV Prox  Full           Yes      Yes                                    +---------+---------------+---------+-----------+----------+-----------------+ FV Mid   Full           Yes      Yes                                    +---------+---------------+---------+-----------+----------+-----------------+ FV DistalFull           Yes      Yes                                    +---------+---------------+---------+-----------+----------+-----------------+ PFV      Full                                                           +---------+---------------+---------+-----------+----------+-----------------+ POP      Full           Yes      Yes                                     +---------+---------------+---------+-----------+----------+-----------------+ PTV      Partial        No       No                   Age indeterminate +---------+---------------+---------+-----------+----------+-----------------+ PERO     Full                                                           +---------+---------------+---------+-----------+----------+-----------------+   +---------+---------------+---------+-----------+----------+-----------------+ LEFT     CompressibilityPhasicitySpontaneityPropertiesThrombus Aging    +---------+---------------+---------+-----------+----------+-----------------+ CFV      Full  Yes      Yes                                    +---------+---------------+---------+-----------+----------+-----------------+ SFJ      Full                                                           +---------+---------------+---------+-----------+----------+-----------------+ FV Prox  Full           Yes      Yes                                    +---------+---------------+---------+-----------+----------+-----------------+ FV Mid   Full           Yes      Yes                                    +---------+---------------+---------+-----------+----------+-----------------+ FV DistalFull           Yes      Yes                                    +---------+---------------+---------+-----------+----------+-----------------+ PFV      Full                                                           +---------+---------------+---------+-----------+----------+-----------------+ POP      Full                                                           +---------+---------------+---------+-----------+----------+-----------------+ PTV      Partial        No       No                   Age Indeterminate +---------+---------------+---------+-----------+----------+-----------------+ PERO     Partial        No       No                    Age Indeterminate +---------+---------------+---------+-----------+----------+-----------------+     Summary: BILATERAL: - No evidence of superficial venous thrombosis in the lower extremities, bilaterally. -No evidence of popliteal cyst, bilaterally. 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  LEFT: - Findings consistent with age indeterminate deep vein thrombosis involving the left posterior tibial veins, and left peroneal veins.  *See table(s) above for measurements and observations. Electronically signed by Deitra Mayo MD on 04/24/2021 at 4:44:45 PM.    Final    [place] EKG SITE RITE  Result Date: 04/25/2021 If Site Rite image not attached, placement could  not be confirmed due to current cardiac rhythm.  [place] EKG SITE RITE  Result Date: 04/19/2021 If Site Rite image not attached, placement could not be confirmed due to current cardiac rhythm.    Subjective: Patient seen examined at bedside, resting comfortably.  Continues with mild shortness of breath currently getting breathing treatment.  Completing radiation treatment today with plans for discharge home under home hospice services.  Spouse present at bedside and updated and grateful for being able to return home.  No other questions or concerns at this time.  No acute events overnight per nursing staff.  Discharge Exam: Vitals:   04/25/21 0749 04/25/21 0800  BP:  135/72  Pulse:  95  Resp:  17  Temp:  97.9 F (36.6 C)  SpO2: 96% 95%   Vitals:   04/25/21 0400 04/25/21 0600 04/25/21 0749 04/25/21 0800  BP: 135/81 (!) 101/57  135/72  Pulse: (!) 104 86  95  Resp: (!) _0 Temp: 98.1 F (36.7 C)   97.9 F (36.6 C)  TempSrc: Axillary   Axillary  SpO2: 93% 93% 96% 95%  Weight:      Height:        General: Pt is alert, awake, not in acute distress, chronically ill in appearance Cardiovascular: RRR, S1/S2 +, no rubs, no  gallops Respiratory: CTA bilaterally, no wheezing, no rhonchi, on NRB Abdominal: Soft, NT, ND, bowel sounds + Extremities: no edema, no cyanosis    The results of significant diagnostics from this hospitalization (including imaging, microbiology, ancillary and laboratory) are listed below for reference.     Microbiology: Recent Results (from the past 240 hour(s))  Culture, blood (Routine x 2)     Status: None   Collection Time: 04/17/21  3:23 AM   Specimen: BLOOD  Result Value Ref Range Status   Specimen Description   Final    BLOOD SPECIMEN SOURCE NOT MARKED ON REQUISITION Performed at [facility] [address] [place], [place] [address]    Special Requests   Final    BOTTLES DRAWN AEROBIC AND ANAEROBIC Blood Culture adequate volume Performed at [facility] [address] [place], [place] [address]    Culture   Final    NO GROWTH 5 DAYS Performed at [facility], [place] [address], [place], [place] [address]    Report Status 04/22/2021 FINAL  Final  SARS CORONAVIRUS 2 (TAT 6-24 HRS) Nasopharyngeal Nasopharyngeal Swab     Status: None   Collection Time: 04/17/21  5:42 AM   Specimen: Nasopharyngeal Swab  Result Value Ref Range Status   SARS Coronavirus 2 NEGATIVE NEGATIVE Final    Comment: (NOTE) SARS-CoV-2 target nucleic acids are NOT DETECTED.  The SARS-CoV-2 RNA is generally detectable in upper and lower respiratory specimens during the acute phase of infection. Negative results do not preclude SARS-CoV-2 infection, do not rule out co-infections with other pathogens, and should not be used as the sole basis for treatment or other patient management decisions. Negative results must be combined with clinical observations, patient history, and epidemiological information. The expected result is Negative.  Fact Sheet for Patients: SugarRoll.be  Fact Sheet for Healthcare  Providers: https://www.woods-mathews.com/  This test is not yet approved or cleared by the [place] FDA and  has been authorized for detection and/or diagnosis of SARS-CoV-2 by FDA under an Emergency Use Authorization (EUA). This EUA will remain  in effect (meaning this test can be used) for the duration of  the COVID-19 declaration under Se ction 564(b)(1) of the Act, 21 U.S.C. section 360bbb-3(b)(1), unless the authorization is terminated or revoked sooner.  Performed at [facility], [place] [address] [place], [place] [address]   Resp Panel by RT-PCR (Flu A&B, Covid) Nasopharyngeal Swab     Status: None   Collection Time: 04/17/21  8:54 AM   Specimen: Nasopharyngeal Swab; Nasopharyngeal(NP) swabs in vial transport medium  Result Value Ref Range Status   SARS Coronavirus 2 by RT PCR NEGATIVE NEGATIVE Final    Comment: (NOTE) SARS-CoV-2 target nucleic acids are NOT DETECTED.  The SARS-CoV-2 RNA is generally detectable in upper respiratory specimens during the acute phase of infection. The lowest concentration of SARS-CoV-2 viral copies this assay can detect is 138 copies/mL. A negative result does not preclude SARS-Cov-2 infection and should not be used as the sole basis for treatment or other patient management decisions. A negative result may occur with  improper specimen collection/handling, submission of specimen other than nasopharyngeal swab, presence of viral mutation(s) within the areas targeted by this assay, and inadequate number of viral copies(<138 copies/mL). A negative result must be combined with clinical observations, patient history, and epidemiological information. The expected result is Negative.  Fact Sheet for Patients:  EntrepreneurPulse.com.au  Fact Sheet for Healthcare Providers:  IncredibleEmployment.be  This test is no t yet approved or cleared by the [place] FDA and  has been authorized  for detection and/or diagnosis of SARS-CoV-2 by FDA under an Emergency Use Authorization (EUA). This EUA will remain  in effect (meaning this test can be used) for the duration of the COVID-19 declaration under Section 564(b)(1) of the Act, 21 U.S.C.section 360bbb-3(b)(1), unless the authorization is terminated  or revoked sooner.       Influenza A by PCR NEGATIVE NEGATIVE Final   Influenza B by PCR NEGATIVE NEGATIVE Final    Comment: (NOTE) The Xpert Xpress SARS-CoV-2/FLU/RSV plus assay is intended as an aid in the diagnosis of influenza from Nasopharyngeal swab specimens and should not be used as a sole basis for treatment. Nasal washings and aspirates are unacceptable for Xpert Xpress SARS-CoV-2/FLU/RSV testing.  Fact Sheet for Patients: EntrepreneurPulse.com.au  Fact Sheet for Healthcare Providers: IncredibleEmployment.be  This test is not yet approved or cleared by the [place] FDA and has been authorized for detection and/or diagnosis of SARS-CoV-2 by FDA under an Emergency Use Authorization (EUA). This EUA will remain in effect (meaning this test can be used) for the duration of the COVID-19 declaration under Section 564(b)(1) of the Act, 21 U.S.C. section 360bbb-3(b)(1), unless the authorization is terminated or revoked.  Performed at [facility], [place] [address] [place], [place] [address]   MRSA Next Gen by PCR, Nasal     Status: None   Collection Time: 04/17/21  1:16 PM   Specimen: Nasal Mucosa; Nasal Swab  Result Value Ref Range Status   MRSA by PCR Next Gen NOT DETECTED NOT DETECTED Final    Comment: (NOTE) The GeneXpert MRSA Assay (FDA approved for NASAL specimens only), is one component of a comprehensive MRSA colonization surveillance program. It is not intended to diagnose MRSA infection nor to guide or monitor treatment for MRSA infections. Test performance is not FDA approved in patients less  than 3 years old. Performed at [facility], [place] [address] [place], [place] [address]      Labs: BNP (last 3 results) No results for input(s): BNP in the last 8760 hours. Basic Metabolic  Panel: Recent Labs  Lab 04/24/21 0636  NA 139  K 4.4  CL 102  CO2 31  GLUCOSE 147*  BUN 11  CREATININE 0.44*  CALCIUM 8.9   Liver Function Tests: Recent Labs  Lab 04/24/21 0636  AST 14*  ALT 17  ALKPHOS 67  BILITOT 0.5  PROT 6.0*  ALBUMIN 2.8*   No results for input(s): LIPASE, AMYLASE in the last 168 hours. No results for input(s): AMMONIA in the last 168 hours. CBC: Recent Labs  Lab 04/24/21 0636  WBC 10.4  HGB 8.0*  HCT 26.8*  MCV 96.8  PLT 161   Cardiac Enzymes: Recent Labs  Lab 04/19/21 0300  CKMB 0   BNP: Invalid input(s): POCBNP CBG: Recent Labs  Lab 04/18/21 1135 04/18/21 1727 04/18/21 2133 04/19/21 0732  GLUCAP 133* 180* 198* 190*   D-Dimer Recent Labs    04/24/21 1042  DDIMER 8.36*   Hgb A1c No results for input(s): HGBA1C in the last 72 hours. Lipid Profile No results for input(s): CHOL, HDL, LDLCALC, TRIG, CHOLHDL, LDLDIRECT in the last 72 hours. Thyroid function studies No results for input(s): TSH, T4TOTAL, T3FREE, THYROIDAB in the last 72 hours.  Invalid input(s): FREET3 Anemia work up No results for input(s): VITAMINB12, FOLATE, FERRITIN, TIBC, IRON, RETICCTPCT in the last 72 hours. Urinalysis    Component Value Date/Time   COLORURINE YELLOW 04/17/2021 [place] 04/17/2021 0657   LABSPEC 1.009 04/17/2021 0657   PHURINE 5.0 04/17/2021 0657   GLUCOSEU NEGATIVE 04/17/2021 0657   HGBUR NEGATIVE 04/17/2021 [place] 04/17/2021 [place] 04/17/2021 0657   PROTEINUR NEGATIVE 04/17/2021 0657   NITRITE NEGATIVE 04/17/2021 0657   LEUKOCYTESUR NEGATIVE 04/17/2021 0657   Sepsis Labs Invalid input(s): PROCALCITONIN,  WBC,  LACTICIDVEN Microbiology Recent Results  (from the past 240 hour(s))  Culture, blood (Routine x 2)     Status: None   Collection Time: 04/17/21  3:23 AM   Specimen: BLOOD  Result Value Ref Range Status   Specimen Description   Final    BLOOD SPECIMEN SOURCE NOT MARKED ON REQUISITION Performed at [facility], [place] [address], [place], [place] [address]    Special Requests   Final    BOTTLES DRAWN AEROBIC AND ANAEROBIC Blood Culture adequate volume Performed at [facility] [address] [place], [place] [address]    Culture   Final    NO GROWTH 5 DAYS Performed at [facility], [place] [address], [place], [place] [address]    Report Status 04/22/2021 FINAL  Final  SARS CORONAVIRUS 2 (TAT 6-24 HRS) Nasopharyngeal Nasopharyngeal Swab     Status: None   Collection Time: 04/17/21  5:42 AM   Specimen: Nasopharyngeal Swab  Result Value Ref Range Status   SARS Coronavirus 2 NEGATIVE NEGATIVE Final    Comment: (NOTE) SARS-CoV-2 target nucleic acids are NOT DETECTED.  The SARS-CoV-2 RNA is generally detectable in upper and lower respiratory specimens during the acute phase of infection. Negative results do not preclude SARS-CoV-2 infection, do not rule out co-infections with other pathogens, and should not be used as the sole basis for treatment or other patient management decisions. Negative results must be combined with clinical observations, patient history, and epidemiological information. The expected result is Negative.  Fact Sheet for Patients: SugarRoll.be  Fact Sheet for Healthcare Providers: https://www.woods-mathews.com/  This test is not yet approved or cleared by the [place] FDA and  has  been authorized for detection and/or diagnosis of SARS-CoV-2 by FDA under an Emergency Use Authorization (EUA). This EUA will remain  in effect (meaning this test can be used) for the duration of the COVID-19 declaration under Se ction  564(b)(1) of the Act, 21 U.S.C. section 360bbb-3(b)(1), unless the authorization is terminated or revoked sooner.  Performed at [facility], [place] [address] [place], [place] [address]   Resp Panel by RT-PCR (Flu A&B, Covid) Nasopharyngeal Swab     Status: None   Collection Time: 04/17/21  8:54 AM   Specimen: Nasopharyngeal Swab; Nasopharyngeal(NP) swabs in vial transport medium  Result Value Ref Range Status   SARS Coronavirus 2 by RT PCR NEGATIVE NEGATIVE Final    Comment: (NOTE) SARS-CoV-2 target nucleic acids are NOT DETECTED.  The SARS-CoV-2 RNA is generally detectable in upper respiratory specimens during the acute phase of infection. The lowest concentration of SARS-CoV-2 viral copies this assay can detect is 138 copies/mL. A negative result does not preclude SARS-Cov-2 infection and should not be used as the sole basis for treatment or other patient management decisions. A negative result may occur with  improper specimen collection/handling, submission of specimen other than nasopharyngeal swab, presence of viral mutation(s) within the areas targeted by this assay, and inadequate number of viral copies(<138 copies/mL). A negative result must be combined with clinical observations, patient history, and epidemiological information. The expected result is Negative.  Fact Sheet for Patients:  EntrepreneurPulse.com.au  Fact Sheet for Healthcare Providers:  IncredibleEmployment.be  This test is no t yet approved or cleared by the [place] FDA and  has been authorized for detection and/or diagnosis of SARS-CoV-2 by FDA under an Emergency Use Authorization (EUA). This EUA will remain  in effect (meaning this test can be used) for the duration of the COVID-19 declaration under Section 564(b)(1) of the Act, 21 U.S.C.section 360bbb-3(b)(1), unless the authorization is terminated  or revoked sooner.       Influenza A by PCR  NEGATIVE NEGATIVE Final   Influenza B by PCR NEGATIVE NEGATIVE Final    Comment: (NOTE) The Xpert Xpress SARS-CoV-2/FLU/RSV plus assay is intended as an aid in the diagnosis of influenza from Nasopharyngeal swab specimens and should not be used as a sole basis for treatment. Nasal washings and aspirates are unacceptable for Xpert Xpress SARS-CoV-2/FLU/RSV testing.  Fact Sheet for Patients: EntrepreneurPulse.com.au  Fact Sheet for Healthcare Providers: IncredibleEmployment.be  This test is not yet approved or cleared by the [place] FDA and has been authorized for detection and/or diagnosis of SARS-CoV-2 by FDA under an Emergency Use Authorization (EUA). This EUA will remain in effect (meaning this test can be used) for the duration of the COVID-19 declaration under Section 564(b)(1) of the Act, 21 U.S.C. section 360bbb-3(b)(1), unless the authorization is terminated or revoked.  Performed at [facility], [place] [address] [place], [place] [address]   MRSA Next Gen by PCR, Nasal     Status: None   Collection Time: 04/17/21  1:16 PM   Specimen: Nasal Mucosa; Nasal Swab  Result Value Ref Range Status   MRSA by PCR Next Gen NOT DETECTED NOT DETECTED Final    Comment: (NOTE) The GeneXpert MRSA Assay (FDA approved for NASAL specimens only), is one component of a comprehensive MRSA colonization surveillance program. It is not intended to diagnose MRSA infection nor to guide or monitor treatment for MRSA infections. Test performance is not FDA approved in patients less than 53 years old. Performed at  [facility], [place] [address], [place], [place] [address]      Time coordinating discharge: Over 30 minutes  SIGNED:   Ariahna Smiddy J [place] DO  [facility] 04/25/2021, 10:32 AM

## 2021-04-25 NOTE — Progress Notes (Addendum)
Patient is doing well this AM. His pain is under excellent control. He is looking forward to discharging home today. I contacted hospice liaison to make sure this is a smooth transition- confirmed all details.  Completed med rec-scripts for methadone and diazapam were sent to pharmacy. CADD Pump and Fentanyl have been ordered by [facility]-RN will set up when he arrives home-will need to notify them when transport arrives.  Additional concentrator to be delivered today-increasing O2 req. 4.   Lovenox will be continued. 5.   DNR on chart.  Discussed care coordination and medication plan with patient and his wife. They are grateful to be going home today and that his pain is under control.  Christopher Hacker, DO Palliative Medicine  Time: 35 min Greater than 50%  of this time was spent counseling and coordinating care related to the above assessment and plan.

## 2021-04-25 NOTE — Progress Notes (Signed)
Peripherally Inserted Central Catheter Placement  The IV Nurse has discussed with the patient and/or persons authorized to consent for the patient, the purpose of this procedure and the potential benefits and risks involved with this procedure.  The benefits include less needle sticks, lab draws from the catheter, and the patient may be discharged home with the catheter. Risks include, but not limited to, infection, bleeding, blood clot (thrombus formation), and puncture of an artery; nerve damage and irregular heartbeat and possibility to perform a PICC exchange if needed/ordered by physician.  Alternatives to this procedure were also discussed.  Bard Power PICC patient education guide, fact sheet on infection prevention and patient information card has been provided to patient /or left at bedside.    PICC Placement Documentation  PICC Single Lumen 04/25/21 Left Brachial 45 cm 0 cm (Active)  Indication for Insertion or Continuance of Line Home intravenous therapies (PICC only) 04/25/21 0947  Exposed Catheter (cm) 0 cm 04/25/21 0947  Site Assessment Clean;Dry;Intact 04/25/21 0947  Line Status Flushed;Saline locked;Blood return noted 04/25/21 0947  Dressing Type Transparent;Securing device 04/25/21 0947  Dressing Status Clean;Dry;Intact 04/25/21 0947  Antimicrobial disc in place? Yes 04/25/21 0947  Safety Lock Not Applicable 18/29/93 7169       Frances Maywood 04/25/2021, 9:50 AM

## 2021-04-25 NOTE — TOC Transition Note (Signed)
Transition of Care [facility]) - CM/SW Discharge Note   Patient Details  Name: Christopher Carter MRN: [id number] Date of Birth: February 16, 1961  [facility] CM/SW Contact:  Leeroy Cha, RN Phone Number: 04/25/2021, 10:35 AM   Clinical Narrative:    10[facility] will pick up at 1430 to take home with hospice. [facility] notified of plan for discharge with ems at 1430 today.     Barriers to Discharge: Continued Medical Work up   Patient Goals and CMS Choice Patient states their goals for this hospitalization and ongoing recovery are:: to go back home      Discharge Placement                       Discharge Plan and Services   Discharge Planning Services: CM Consult Post Acute Care Choice: Hospice                               Social Determinants of Health (SDOH) Interventions     Readmission Risk Interventions No flowsheet data found.

## 2021-04-25 NOTE — Progress Notes (Signed)
Palliative Care Progress Note  Increased O2 needs today-period of hypoxia but mostly asymptomatic during desaturations. 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 Now on Lovenox. Discussed in detail risks/benefits of anticoagulation in context of his goals of care. His pain is significantly improved-on minimal continuous fentanyl, may be having some sedation as methadone reaches peak today. Off ketamine.  Plan remains solid for discharge home tomorrow with hospice. As long as he is stable to be transported they want to go home. They understand prognosis is limited.  Recommendations:  DNR Home tomorrow with [place] Will need home Fentanyl PCA-recommend 31mcg q15 min prn pain-probably does not need a basal rate.  Methadone 20 qAM and 10qPM Will continue Lovenox on dc-ideally on a once daily dose and monitor carefully for signs of bleeding or hemoptysis. Continue currrent medication regimen at home including steroids, gabapentin and anti-inflammatory. Will need ambulance transport home.  I will work with hospice to make sure transition home is as smooth as possible.  Lane Hacker, DO Palliative Medicine  Time: 35 min Greater than 50%  of this time was spent counseling and coordinating care related to the above assessment and plan.

## 2021-04-25 NOTE — Progress Notes (Signed)
Pt removed PICC line. Catheter intact upon removal. Bleeding controlled. Clarene Essex NP and Dr. Hilma Favors notified. New order for IV team consult placed.

## 2021-04-26 ENCOUNTER — Ambulatory Visit: Admit: 2021-04-26 | Payer: 59

## 2021-04-27 ENCOUNTER — Ambulatory Visit: Payer: 59

## 2021-04-30 ENCOUNTER — Ambulatory Visit: Payer: 59

## 2021-04-30 NOTE — Progress Notes (Signed)
  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  Indication for treatment: palliative       Radiation treatment dates:   03/21/21-04/18/21 for right lung and regional nodes and 04/19/21-04/25/21 for T12-L3 spine  Site/planned dose:   The right lung and nodes were to be treated up to 66 Gy in 33 fractions with 3D planning. The patient did not complete this course but received a total of 40 Gy in 20 fractions. The T12-L3 spine was treated to 20 Gy in 5 fractions with isodose planning.  Narrative: The patient's status declined during his course of definitive radiotherapy, and the decision was to forgo remainder of treatment to the chest and only focus on spine radiotherapy due to severe and difficult to manage pain.  Plan: The patient will transition to home hospice after   radiation. ________________________________     Carola Rhine, PAC

## 2021-05-01 ENCOUNTER — Ambulatory Visit: Payer: 59

## 2021-05-02 ENCOUNTER — Ambulatory Visit: Payer: 59

## 2021-05-03 ENCOUNTER — Ambulatory Visit: Payer: 59

## 2021-05-04 ENCOUNTER — Ambulatory Visit: Payer: 59

## 2021-05-07 ENCOUNTER — Ambulatory Visit: Payer: 59

## 2021-05-10 DEATH — deceased

## 2021-05-17 ENCOUNTER — Other Ambulatory Visit: Payer: Self-pay | Admitting: Internal Medicine
